# Patient Record
Sex: Male | Born: 1945
Health system: Southern US, Community
[De-identification: ages and names within clinical notes are randomized; demographics above are authoritative.]

## PROBLEM LIST (undated history)

## (undated) DIAGNOSIS — F419 Anxiety disorder, unspecified: Secondary | ICD-10-CM

## (undated) DIAGNOSIS — F32A Depression, unspecified: Secondary | ICD-10-CM

## (undated) DIAGNOSIS — E785 Hyperlipidemia, unspecified: Secondary | ICD-10-CM

## (undated) DIAGNOSIS — G4700 Insomnia, unspecified: Secondary | ICD-10-CM

## (undated) DIAGNOSIS — K449 Diaphragmatic hernia without obstruction or gangrene: Secondary | ICD-10-CM

## (undated) DIAGNOSIS — E119 Type 2 diabetes mellitus without complications: Secondary | ICD-10-CM

## (undated) DIAGNOSIS — H3530 Unspecified macular degeneration: Secondary | ICD-10-CM

## (undated) DIAGNOSIS — D369 Benign neoplasm, unspecified site: Secondary | ICD-10-CM

## (undated) DIAGNOSIS — E669 Obesity, unspecified: Secondary | ICD-10-CM

## (undated) DIAGNOSIS — K219 Gastro-esophageal reflux disease without esophagitis: Secondary | ICD-10-CM

## (undated) DIAGNOSIS — D689 Coagulation defect, unspecified: Secondary | ICD-10-CM

## (undated) DIAGNOSIS — D500 Iron deficiency anemia secondary to blood loss (chronic): Secondary | ICD-10-CM

## (undated) DIAGNOSIS — G4730 Sleep apnea, unspecified: Secondary | ICD-10-CM

## (undated) DIAGNOSIS — I81 Portal vein thrombosis: Secondary | ICD-10-CM

## (undated) DIAGNOSIS — Z8619 Personal history of other infectious and parasitic diseases: Secondary | ICD-10-CM

## (undated) DIAGNOSIS — R351 Nocturia: Principal | ICD-10-CM

## (undated) DIAGNOSIS — J310 Chronic rhinitis: Secondary | ICD-10-CM

## (undated) DIAGNOSIS — F329 Major depressive disorder, single episode, unspecified: Secondary | ICD-10-CM

## (undated) DIAGNOSIS — M1990 Unspecified osteoarthritis, unspecified site: Secondary | ICD-10-CM

## (undated) DIAGNOSIS — I10 Essential (primary) hypertension: Secondary | ICD-10-CM

## (undated) DIAGNOSIS — D45 Polycythemia vera: Secondary | ICD-10-CM

## (undated) DIAGNOSIS — F4321 Adjustment disorder with depressed mood: Secondary | ICD-10-CM

## (undated) HISTORY — DX: Unspecified osteoarthritis, unspecified site: M19.90

## (undated) HISTORY — DX: Sleep apnea, unspecified: G47.30

## (undated) HISTORY — DX: Gastro-esophageal reflux disease without esophagitis: K21.9

## (undated) HISTORY — DX: Chronic rhinitis: J31.0

## (undated) HISTORY — DX: Obesity, unspecified: E66.9

## (undated) HISTORY — PX: TONSILLECTOMY: SUR1361

## (undated) HISTORY — DX: Diaphragmatic hernia without obstruction or gangrene: K44.9

## (undated) HISTORY — DX: Type 2 diabetes mellitus without complications: E11.9

## (undated) HISTORY — PX: TENDON REPAIR: SHX5111

## (undated) HISTORY — DX: Portal vein thrombosis: I81

## (undated) HISTORY — DX: Major depressive disorder, single episode, unspecified: F32.9

## (undated) HISTORY — DX: Coagulation defect, unspecified: D68.9

## (undated) HISTORY — DX: Nocturia: R35.1

## (undated) HISTORY — DX: Polycythemia vera: D45

## (undated) HISTORY — PX: CARDIAC CATHETERIZATION: SHX172

## (undated) HISTORY — DX: Insomnia, unspecified: G47.00

## (undated) HISTORY — PX: WISDOM TOOTH EXTRACTION: SHX21

## (undated) HISTORY — DX: Anxiety disorder, unspecified: F41.9

## (undated) HISTORY — DX: Depression, unspecified: F32.A

## (undated) HISTORY — DX: Hyperlipidemia, unspecified: E78.5

## (undated) HISTORY — PX: ROTATOR CUFF REPAIR: SHX139

## (undated) HISTORY — DX: Iron deficiency anemia secondary to blood loss (chronic): D50.0

## (undated) HISTORY — PX: CARPAL TUNNEL RELEASE: SHX101

## (undated) HISTORY — DX: Adjustment disorder with depressed mood: F43.21

## (undated) HISTORY — DX: Benign neoplasm, unspecified site: D36.9

## (undated) HISTORY — DX: Essential (primary) hypertension: I10

## (undated) HISTORY — DX: Personal history of other infectious and parasitic diseases: Z86.19

---

## 1998-08-03 ENCOUNTER — Ambulatory Visit (HOSPITAL_COMMUNITY): Admission: RE | Admit: 1998-08-03 | Discharge: 1998-08-03 | Payer: Self-pay | Admitting: *Deleted

## 1999-04-02 ENCOUNTER — Emergency Department (HOSPITAL_COMMUNITY): Admission: EM | Admit: 1999-04-02 | Discharge: 1999-04-02 | Payer: Self-pay | Admitting: *Deleted

## 1999-04-03 ENCOUNTER — Encounter: Payer: Self-pay | Admitting: *Deleted

## 1999-04-21 ENCOUNTER — Ambulatory Visit (HOSPITAL_BASED_OUTPATIENT_CLINIC_OR_DEPARTMENT_OTHER): Admission: RE | Admit: 1999-04-21 | Discharge: 1999-04-21 | Payer: Self-pay | Admitting: *Deleted

## 2002-05-16 ENCOUNTER — Encounter (INDEPENDENT_AMBULATORY_CARE_PROVIDER_SITE_OTHER): Payer: Self-pay | Admitting: *Deleted

## 2002-05-16 ENCOUNTER — Ambulatory Visit (HOSPITAL_COMMUNITY): Admission: RE | Admit: 2002-05-16 | Discharge: 2002-05-16 | Payer: Self-pay | Admitting: *Deleted

## 2005-01-23 ENCOUNTER — Encounter: Admission: RE | Admit: 2005-01-23 | Discharge: 2005-01-23 | Payer: Self-pay | Admitting: Orthopaedic Surgery

## 2005-01-28 ENCOUNTER — Ambulatory Visit (HOSPITAL_COMMUNITY): Admission: RE | Admit: 2005-01-28 | Discharge: 2005-01-29 | Payer: Self-pay | Admitting: Orthopedic Surgery

## 2005-02-06 ENCOUNTER — Encounter: Admission: RE | Admit: 2005-02-06 | Discharge: 2005-03-20 | Payer: Self-pay | Admitting: Orthopedic Surgery

## 2005-06-17 HISTORY — PX: COLONOSCOPY: SHX174

## 2005-06-28 ENCOUNTER — Ambulatory Visit (HOSPITAL_COMMUNITY): Admission: RE | Admit: 2005-06-28 | Discharge: 2005-06-28 | Payer: Self-pay | Admitting: *Deleted

## 2006-03-07 ENCOUNTER — Ambulatory Visit: Payer: Self-pay | Admitting: Hematology & Oncology

## 2006-03-22 LAB — CBC WITH DIFFERENTIAL/PLATELET
BASO%: 0.3 % (ref 0.0–2.0)
MCHC: 34.1 g/dL (ref 32.0–35.9)
MONO#: 0.7 10*3/uL (ref 0.1–0.9)
RBC: 6.01 10*6/uL — ABNORMAL HIGH (ref 4.20–5.71)
RDW: 13.4 % (ref 11.2–14.6)
WBC: 7.3 10*3/uL (ref 4.0–10.0)
lymph#: 1.3 10*3/uL (ref 0.9–3.3)

## 2006-03-29 LAB — CBC WITH DIFFERENTIAL/PLATELET
Basophils Absolute: 0 10*3/uL (ref 0.0–0.1)
Eosinophils Absolute: 0.1 10*3/uL (ref 0.0–0.5)
HCT: 52.6 % — ABNORMAL HIGH (ref 38.7–49.9)
HGB: 17.9 g/dL — ABNORMAL HIGH (ref 13.0–17.1)
MCH: 30.3 pg (ref 28.0–33.4)
NEUT#: 5.5 10*3/uL (ref 1.5–6.5)
NEUT%: 71 % (ref 40.0–75.0)
RDW: 13 % (ref 11.2–14.6)
lymph#: 1.4 10*3/uL (ref 0.9–3.3)

## 2006-04-05 LAB — CBC WITH DIFFERENTIAL/PLATELET
Basophils Absolute: 0 10*3/uL (ref 0.0–0.1)
EOS%: 0.7 % (ref 0.0–7.0)
Eosinophils Absolute: 0.1 10*3/uL (ref 0.0–0.5)
HCT: 50.1 % — ABNORMAL HIGH (ref 38.7–49.9)
HGB: 17.1 g/dL (ref 13.0–17.1)
LYMPH%: 16.3 % (ref 14.0–48.0)
MCH: 30.2 pg (ref 28.0–33.4)
MCV: 88.6 fL (ref 81.6–98.0)
MONO%: 9.2 % (ref 0.0–13.0)
NEUT#: 5.9 10*3/uL (ref 1.5–6.5)
NEUT%: 73.4 % (ref 40.0–75.0)
Platelets: 297 10*3/uL (ref 145–400)

## 2006-04-12 LAB — CBC WITH DIFFERENTIAL/PLATELET
BASO%: 0.3 % (ref 0.0–2.0)
HCT: 46 % (ref 38.7–49.9)
HGB: 15.7 g/dL (ref 13.0–17.1)
MCHC: 34.1 g/dL (ref 32.0–35.9)
MONO#: 0.6 10*3/uL (ref 0.1–0.9)
NEUT#: 4.3 10*3/uL (ref 1.5–6.5)
NEUT%: 73.7 % (ref 40.0–75.0)
WBC: 5.9 10*3/uL (ref 4.0–10.0)
lymph#: 0.9 10*3/uL (ref 0.9–3.3)

## 2006-04-24 ENCOUNTER — Ambulatory Visit: Payer: Self-pay | Admitting: Hematology & Oncology

## 2006-04-26 LAB — CBC WITH DIFFERENTIAL/PLATELET
Basophils Absolute: 0 10*3/uL (ref 0.0–0.1)
EOS%: 1 % (ref 0.0–7.0)
Eosinophils Absolute: 0.1 10*3/uL (ref 0.0–0.5)
LYMPH%: 18.2 % (ref 14.0–48.0)
MCH: 30.3 pg (ref 28.0–33.4)
MCV: 89.3 fL (ref 81.6–98.0)
MONO%: 10.7 % (ref 0.0–13.0)
NEUT#: 4.7 10*3/uL (ref 1.5–6.5)
Platelets: 297 10*3/uL (ref 145–400)
RBC: 5.23 10*6/uL (ref 4.20–5.71)

## 2006-05-10 LAB — CBC WITH DIFFERENTIAL/PLATELET
BASO%: 1.1 % (ref 0.0–2.0)
LYMPH%: 17.5 % (ref 14.0–48.0)
MCHC: 34.7 g/dL (ref 32.0–35.9)
MCV: 85.6 fL (ref 81.6–98.0)
MONO%: 11.2 % (ref 0.0–13.0)
Platelets: 308 10*3/uL (ref 145–400)
RBC: 5.46 10*6/uL (ref 4.20–5.71)
RDW: 11.8 % (ref 11.2–14.6)
WBC: 6.5 10*3/uL (ref 4.0–10.0)

## 2006-05-24 LAB — CBC WITH DIFFERENTIAL/PLATELET
BASO%: 0.2 % (ref 0.0–2.0)
Eosinophils Absolute: 0.1 10*3/uL (ref 0.0–0.5)
MCHC: 33.6 g/dL (ref 32.0–35.9)
MONO#: 0.8 10*3/uL (ref 0.1–0.9)
NEUT#: 4.5 10*3/uL (ref 1.5–6.5)
Platelets: 321 10*3/uL (ref 145–400)
RBC: 5.18 10*6/uL (ref 4.20–5.71)
RDW: 13 % (ref 11.2–14.6)
WBC: 6.6 10*3/uL (ref 4.0–10.0)
lymph#: 1.2 10*3/uL (ref 0.9–3.3)

## 2006-06-07 ENCOUNTER — Ambulatory Visit: Payer: Self-pay | Admitting: Hematology & Oncology

## 2006-06-07 LAB — CBC WITH DIFFERENTIAL/PLATELET
BASO%: 0.4 % (ref 0.0–2.0)
Eosinophils Absolute: 0.1 10*3/uL (ref 0.0–0.5)
HCT: 43.4 % (ref 38.7–49.9)
LYMPH%: 20.1 % (ref 14.0–48.0)
MONO#: 0.7 10*3/uL (ref 0.1–0.9)
NEUT#: 3.6 10*3/uL (ref 1.5–6.5)
NEUT%: 65.4 % (ref 40.0–75.0)
Platelets: 341 10*3/uL (ref 145–400)
WBC: 5.5 10*3/uL (ref 4.0–10.0)
lymph#: 1.1 10*3/uL (ref 0.9–3.3)

## 2006-06-21 LAB — CBC WITH DIFFERENTIAL/PLATELET
BASO%: 0.5 % (ref 0.0–2.0)
HCT: 43.1 % (ref 38.7–49.9)
MCHC: 32.7 g/dL (ref 32.0–35.9)
MONO#: 0.5 10*3/uL (ref 0.1–0.9)
NEUT%: 72.8 % (ref 40.0–75.0)
RDW: 13.7 % (ref 11.2–14.6)
WBC: 5.4 10*3/uL (ref 4.0–10.0)
lymph#: 0.8 10*3/uL — ABNORMAL LOW (ref 0.9–3.3)

## 2006-06-21 LAB — FERRITIN: Ferritin: 7 ng/mL — ABNORMAL LOW (ref 22–322)

## 2006-06-21 LAB — CHCC SMEAR

## 2006-07-18 ENCOUNTER — Ambulatory Visit: Payer: Self-pay | Admitting: Hematology & Oncology

## 2006-07-19 LAB — CBC WITH DIFFERENTIAL/PLATELET
Basophils Absolute: 0 10*3/uL (ref 0.0–0.1)
EOS%: 1.4 % (ref 0.0–7.0)
HCT: 44.9 % (ref 38.7–49.9)
HGB: 14.8 g/dL (ref 13.0–17.1)
MCH: 26.7 pg — ABNORMAL LOW (ref 28.0–33.4)
MCV: 80.8 fL — ABNORMAL LOW (ref 81.6–98.0)
MONO%: 12.3 % (ref 0.0–13.0)
NEUT%: 67.1 % (ref 40.0–75.0)
lymph#: 1.1 10*3/uL (ref 0.9–3.3)

## 2006-08-16 LAB — CBC WITH DIFFERENTIAL/PLATELET
Basophils Absolute: 0 10*3/uL (ref 0.0–0.1)
EOS%: 1.8 % (ref 0.0–7.0)
HGB: 14.8 g/dL (ref 13.0–17.1)
LYMPH%: 13.8 % — ABNORMAL LOW (ref 14.0–48.0)
MCH: 25.9 pg — ABNORMAL LOW (ref 28.0–33.4)
MCV: 78.7 fL — ABNORMAL LOW (ref 81.6–98.0)
MONO%: 11 % (ref 0.0–13.0)
RDW: 16 % — ABNORMAL HIGH (ref 11.2–14.6)

## 2006-08-17 LAB — ABO AND RH: Rh Type: POSITIVE

## 2006-09-11 ENCOUNTER — Ambulatory Visit: Payer: Self-pay | Admitting: Hematology & Oncology

## 2006-09-13 LAB — CBC WITH DIFFERENTIAL/PLATELET
BASO%: 0.4 % (ref 0.0–2.0)
EOS%: 1.9 % (ref 0.0–7.0)
HCT: 45.7 % (ref 38.7–49.9)
LYMPH%: 15.8 % (ref 14.0–48.0)
MCH: 25.9 pg — ABNORMAL LOW (ref 28.0–33.4)
MCHC: 33 g/dL (ref 32.0–35.9)
MCV: 78.3 fL — ABNORMAL LOW (ref 81.6–98.0)
MONO%: 13.6 % — ABNORMAL HIGH (ref 0.0–13.0)
NEUT%: 68.3 % (ref 40.0–75.0)
Platelets: 301 10*3/uL (ref 145–400)
lymph#: 1 10*3/uL (ref 0.9–3.3)

## 2006-10-31 ENCOUNTER — Ambulatory Visit: Payer: Self-pay | Admitting: Hematology & Oncology

## 2006-11-09 LAB — CBC WITH DIFFERENTIAL/PLATELET
Basophils Absolute: 0 10*3/uL (ref 0.0–0.1)
Eosinophils Absolute: 0.1 10*3/uL (ref 0.0–0.5)
HCT: 47.2 % (ref 38.7–49.9)
HGB: 15.5 g/dL (ref 13.0–17.1)
MONO#: 0.6 10*3/uL (ref 0.1–0.9)
NEUT%: 67.8 % (ref 40.0–75.0)
WBC: 5.8 10*3/uL (ref 4.0–10.0)
lymph#: 1.1 10*3/uL (ref 0.9–3.3)

## 2006-12-07 LAB — CBC WITH DIFFERENTIAL/PLATELET
BASO%: 0.3 % (ref 0.0–2.0)
HCT: 45.5 % (ref 38.7–49.9)
MCHC: 33.2 g/dL (ref 32.0–35.9)
MONO#: 0.8 10*3/uL (ref 0.1–0.9)
NEUT#: 3.7 10*3/uL (ref 1.5–6.5)
NEUT%: 65.3 % (ref 40.0–75.0)
RBC: 5.62 10*6/uL (ref 4.20–5.71)
WBC: 5.7 10*3/uL (ref 4.0–10.0)
lymph#: 1.1 10*3/uL (ref 0.9–3.3)

## 2007-01-08 ENCOUNTER — Ambulatory Visit: Payer: Self-pay | Admitting: Hematology & Oncology

## 2007-01-11 LAB — CBC WITH DIFFERENTIAL/PLATELET
EOS%: 2.1 % (ref 0.0–7.0)
Eosinophils Absolute: 0.1 10*3/uL (ref 0.0–0.5)
LYMPH%: 22.1 % (ref 14.0–48.0)
MCH: 26.3 pg — ABNORMAL LOW (ref 28.0–33.4)
MCHC: 32.9 g/dL (ref 32.0–35.9)
MCV: 80 fL — ABNORMAL LOW (ref 81.6–98.0)
MONO%: 12.8 % (ref 0.0–13.0)
NEUT#: 3.2 10*3/uL (ref 1.5–6.5)
Platelets: 251 10*3/uL (ref 145–400)
RBC: 5.81 10*6/uL — ABNORMAL HIGH (ref 4.20–5.71)

## 2007-01-11 LAB — FERRITIN: Ferritin: 11 ng/mL — ABNORMAL LOW (ref 22–322)

## 2007-02-08 LAB — CBC WITH DIFFERENTIAL/PLATELET
BASO%: 0.5 % (ref 0.0–2.0)
EOS%: 1.3 % (ref 0.0–7.0)
HCT: 45.3 % (ref 38.7–49.9)
LYMPH%: 12.4 % — ABNORMAL LOW (ref 14.0–48.0)
MCH: 26.6 pg — ABNORMAL LOW (ref 28.0–33.4)
MCHC: 33.4 g/dL (ref 32.0–35.9)
NEUT%: 75.7 % — ABNORMAL HIGH (ref 40.0–75.0)
RBC: 5.69 10*6/uL (ref 4.20–5.71)
lymph#: 1 10*3/uL (ref 0.9–3.3)

## 2007-03-05 ENCOUNTER — Ambulatory Visit: Payer: Self-pay | Admitting: Hematology & Oncology

## 2007-03-08 LAB — CBC WITH DIFFERENTIAL/PLATELET
Basophils Absolute: 0 10*3/uL (ref 0.0–0.1)
Eosinophils Absolute: 0.1 10*3/uL (ref 0.0–0.5)
HGB: 15.9 g/dL (ref 13.0–17.1)
MCV: 78.6 fL — ABNORMAL LOW (ref 81.6–98.0)
MONO#: 0.8 10*3/uL (ref 0.1–0.9)
MONO%: 14 % — ABNORMAL HIGH (ref 0.0–13.0)
NEUT#: 3.5 10*3/uL (ref 1.5–6.5)
RBC: 5.84 10*6/uL — ABNORMAL HIGH (ref 4.20–5.71)
RDW: 16.8 % — ABNORMAL HIGH (ref 11.2–14.6)
WBC: 5.4 10*3/uL (ref 4.0–10.0)
lymph#: 1.1 10*3/uL (ref 0.9–3.3)

## 2007-04-17 LAB — CBC WITH DIFFERENTIAL/PLATELET
Basophils Absolute: 0 10*3/uL (ref 0.0–0.1)
Eosinophils Absolute: 0.2 10*3/uL (ref 0.0–0.5)
HCT: 44.6 % (ref 38.7–49.9)
HGB: 14.7 g/dL (ref 13.0–17.1)
LYMPH%: 19.7 % (ref 14.0–48.0)
MCV: 79.9 fL — ABNORMAL LOW (ref 81.6–98.0)
MONO#: 0.7 10*3/uL (ref 0.1–0.9)
MONO%: 11.7 % (ref 0.0–13.0)
NEUT#: 3.7 10*3/uL (ref 1.5–6.5)
NEUT%: 64.7 % (ref 40.0–75.0)
Platelets: 308 10*3/uL (ref 145–400)
RBC: 5.59 10*6/uL (ref 4.20–5.71)
WBC: 5.7 10*3/uL (ref 4.0–10.0)

## 2007-05-26 ENCOUNTER — Ambulatory Visit: Payer: Self-pay | Admitting: Hematology & Oncology

## 2007-05-29 LAB — CBC WITH DIFFERENTIAL/PLATELET
BASO%: 0.4 % (ref 0.0–2.0)
HCT: 43.3 % (ref 38.7–49.9)
LYMPH%: 17.2 % (ref 14.0–48.0)
MCHC: 33.3 g/dL (ref 32.0–35.9)
MCV: 80.4 fL — ABNORMAL LOW (ref 81.6–98.0)
MONO#: 0.7 10*3/uL (ref 0.1–0.9)
MONO%: 12.4 % (ref 0.0–13.0)
NEUT%: 67.4 % (ref 40.0–75.0)
Platelets: 247 10*3/uL (ref 145–400)
RBC: 5.38 10*6/uL (ref 4.20–5.71)
WBC: 6 10*3/uL (ref 4.0–10.0)

## 2007-07-22 ENCOUNTER — Ambulatory Visit: Payer: Self-pay | Admitting: Hematology & Oncology

## 2007-07-24 LAB — CBC WITH DIFFERENTIAL/PLATELET
BASO%: 0.3 % (ref 0.0–2.0)
EOS%: 2.4 % (ref 0.0–7.0)
HCT: 43.2 % (ref 38.7–49.9)
LYMPH%: 17.3 % (ref 14.0–48.0)
MCH: 27.3 pg — ABNORMAL LOW (ref 28.0–33.4)
MCHC: 34.3 g/dL (ref 32.0–35.9)
NEUT%: 65.8 % (ref 40.0–75.0)
Platelets: 254 10*3/uL (ref 145–400)
RBC: 5.44 10*6/uL (ref 4.20–5.71)
lymph#: 1.1 10*3/uL (ref 0.9–3.3)

## 2007-07-24 LAB — FERRITIN: Ferritin: 9 ng/mL — ABNORMAL LOW (ref 22–322)

## 2007-10-21 ENCOUNTER — Ambulatory Visit: Payer: Self-pay | Admitting: Hematology & Oncology

## 2007-10-23 LAB — CBC WITH DIFFERENTIAL/PLATELET
BASO%: 0.3 % (ref 0.0–2.0)
EOS%: 2.8 % (ref 0.0–7.0)
MCH: 27.9 pg — ABNORMAL LOW (ref 28.0–33.4)
MCV: 80.9 fL — ABNORMAL LOW (ref 81.6–98.0)
MONO%: 9.9 % (ref 0.0–13.0)
RBC: 5.74 10*6/uL — ABNORMAL HIGH (ref 4.20–5.71)
RDW: 15.6 % — ABNORMAL HIGH (ref 11.2–14.6)

## 2007-11-16 ENCOUNTER — Emergency Department (HOSPITAL_COMMUNITY): Admission: EM | Admit: 2007-11-16 | Discharge: 2007-11-16 | Payer: Self-pay | Admitting: *Deleted

## 2007-11-17 ENCOUNTER — Emergency Department (HOSPITAL_COMMUNITY): Admission: EM | Admit: 2007-11-17 | Discharge: 2007-11-17 | Payer: Self-pay | Admitting: Emergency Medicine

## 2008-01-20 ENCOUNTER — Ambulatory Visit: Payer: Self-pay | Admitting: Hematology & Oncology

## 2008-01-22 LAB — CBC WITH DIFFERENTIAL/PLATELET
EOS%: 2.8 % (ref 0.0–7.0)
Eosinophils Absolute: 0.3 10*3/uL (ref 0.0–0.5)
MCH: 26.7 pg — ABNORMAL LOW (ref 28.0–33.4)
MCV: 79.5 fL — ABNORMAL LOW (ref 81.6–98.0)
MONO%: 10.3 % (ref 0.0–13.0)
NEUT#: 6.9 10*3/uL — ABNORMAL HIGH (ref 1.5–6.5)
RBC: 5.8 10*6/uL — ABNORMAL HIGH (ref 4.20–5.71)
RDW: 15.4 % — ABNORMAL HIGH (ref 11.2–14.6)

## 2008-01-22 LAB — FERRITIN: Ferritin: 16 ng/mL — ABNORMAL LOW (ref 22–322)

## 2008-05-18 ENCOUNTER — Ambulatory Visit: Payer: Self-pay | Admitting: Hematology & Oncology

## 2008-05-18 LAB — CBC WITH DIFFERENTIAL/PLATELET
BASO%: 0.3 % (ref 0.0–2.0)
Basophils Absolute: 0 10*3/uL (ref 0.0–0.1)
EOS%: 1.6 % (ref 0.0–7.0)
HGB: 15.3 g/dL (ref 13.0–17.1)
MCH: 27.3 pg — ABNORMAL LOW (ref 28.0–33.4)
MCHC: 33.3 g/dL (ref 32.0–35.9)
MCV: 82 fL (ref 81.6–98.0)
MONO%: 10.6 % (ref 0.0–13.0)
RBC: 5.6 10*6/uL (ref 4.20–5.71)
RDW: 15.9 % — ABNORMAL HIGH (ref 11.2–14.6)

## 2008-07-15 ENCOUNTER — Encounter: Admission: RE | Admit: 2008-07-15 | Discharge: 2008-07-15 | Payer: Self-pay | Admitting: Specialist

## 2008-07-20 ENCOUNTER — Ambulatory Visit: Payer: Self-pay | Admitting: Hematology & Oncology

## 2008-07-22 LAB — CBC WITH DIFFERENTIAL (CANCER CENTER ONLY)
BASO#: 0 10*3/uL (ref 0.0–0.2)
Eosinophils Absolute: 0.2 10*3/uL (ref 0.0–0.5)
HCT: 46.7 % (ref 38.7–49.9)
HGB: 15.6 g/dL (ref 13.0–17.1)
LYMPH%: 17.6 % (ref 14.0–48.0)
MCH: 27.2 pg — ABNORMAL LOW (ref 28.0–33.4)
MCV: 81 fL — ABNORMAL LOW (ref 82–98)
MONO#: 0.5 10*3/uL (ref 0.1–0.9)
MONO%: 8.3 % (ref 0.0–13.0)
NEUT%: 70.8 % (ref 40.0–80.0)
Platelets: 271 10*3/uL (ref 145–400)
RBC: 5.73 10*6/uL — ABNORMAL HIGH (ref 4.20–5.70)
WBC: 5.7 10*3/uL (ref 4.0–10.0)

## 2008-07-28 LAB — JAK2 GENOTYPR

## 2008-07-28 LAB — FERRITIN: Ferritin: 11 ng/mL — ABNORMAL LOW (ref 22–322)

## 2008-10-20 ENCOUNTER — Ambulatory Visit: Payer: Self-pay | Admitting: Hematology & Oncology

## 2008-10-21 LAB — CBC WITH DIFFERENTIAL (CANCER CENTER ONLY)
BASO#: 0 10*3/uL (ref 0.0–0.2)
Eosinophils Absolute: 0.2 10*3/uL (ref 0.0–0.5)
HGB: 15 g/dL (ref 13.0–17.1)
MCH: 26.3 pg — ABNORMAL LOW (ref 28.0–33.4)
MONO#: 0.6 10*3/uL (ref 0.1–0.9)
MONO%: 8.9 % (ref 0.0–13.0)
NEUT#: 4.3 10*3/uL (ref 1.5–6.5)
RBC: 5.72 10*6/uL — ABNORMAL HIGH (ref 4.20–5.70)
WBC: 6.4 10*3/uL (ref 4.0–10.0)

## 2008-10-21 LAB — FERRITIN: Ferritin: 13 ng/mL — ABNORMAL LOW (ref 22–322)

## 2008-12-14 ENCOUNTER — Ambulatory Visit: Payer: Self-pay | Admitting: Hematology & Oncology

## 2008-12-15 LAB — CBC WITH DIFFERENTIAL (CANCER CENTER ONLY)
BASO#: 0.1 10*3/uL (ref 0.0–0.2)
EOS%: 2.8 % (ref 0.0–7.0)
Eosinophils Absolute: 0.2 10*3/uL (ref 0.0–0.5)
HGB: 15.1 g/dL (ref 13.0–17.1)
LYMPH#: 1.4 10*3/uL (ref 0.9–3.3)
NEUT#: 5.5 10*3/uL (ref 1.5–6.5)
Platelets: 277 10*3/uL (ref 145–400)
RBC: 5.8 10*6/uL — ABNORMAL HIGH (ref 4.20–5.70)

## 2008-12-18 HISTORY — PX: TOTAL KNEE ARTHROPLASTY: SHX125

## 2009-02-03 ENCOUNTER — Ambulatory Visit: Payer: Self-pay | Admitting: Hematology & Oncology

## 2009-02-04 LAB — CBC WITH DIFFERENTIAL (CANCER CENTER ONLY)
BASO%: 0.5 % (ref 0.0–2.0)
EOS%: 2.6 % (ref 0.0–7.0)
LYMPH#: 1.3 10*3/uL (ref 0.9–3.3)
MCHC: 32.2 g/dL (ref 32.0–35.9)
MONO%: 5.9 % (ref 0.0–13.0)
NEUT#: 4.1 10*3/uL (ref 1.5–6.5)
Platelets: 269 10*3/uL (ref 145–400)
RDW: 14.6 % (ref 10.5–14.6)

## 2009-02-10 ENCOUNTER — Inpatient Hospital Stay (HOSPITAL_COMMUNITY): Admission: RE | Admit: 2009-02-10 | Discharge: 2009-02-13 | Payer: Self-pay | Admitting: Specialist

## 2009-03-23 ENCOUNTER — Ambulatory Visit: Payer: Self-pay | Admitting: Hematology & Oncology

## 2009-03-25 LAB — CBC WITH DIFFERENTIAL (CANCER CENTER ONLY)
BASO%: 0.4 % (ref 0.0–2.0)
EOS%: 2.8 % (ref 0.0–7.0)
HCT: 41.3 % (ref 38.7–49.9)
LYMPH#: 1 10*3/uL (ref 0.9–3.3)
LYMPH%: 18.8 % (ref 14.0–48.0)
MCH: 26.3 pg — ABNORMAL LOW (ref 28.0–33.4)
MCHC: 32.1 g/dL (ref 32.0–35.9)
MONO%: 9.1 % (ref 0.0–13.0)
NEUT%: 68.9 % (ref 40.0–80.0)
RDW: 13.8 % (ref 10.5–14.6)

## 2009-03-25 LAB — FERRITIN: Ferritin: 20 ng/mL — ABNORMAL LOW (ref 22–322)

## 2009-05-18 ENCOUNTER — Ambulatory Visit: Payer: Self-pay | Admitting: Hematology & Oncology

## 2009-08-19 ENCOUNTER — Ambulatory Visit: Payer: Self-pay | Admitting: Hematology & Oncology

## 2009-08-19 LAB — CBC WITH DIFFERENTIAL (CANCER CENTER ONLY)
BASO%: 0.7 % (ref 0.0–2.0)
HCT: 44.9 % (ref 38.7–49.9)
LYMPH%: 20.5 % (ref 14.0–48.0)
MCHC: 32.5 g/dL (ref 32.0–35.9)
MCV: 78 fL — ABNORMAL LOW (ref 82–98)
MONO#: 0.5 10*3/uL (ref 0.1–0.9)
NEUT%: 69.1 % (ref 40.0–80.0)
RDW: 15.8 % — ABNORMAL HIGH (ref 10.5–14.6)
WBC: 6.8 10*3/uL (ref 4.0–10.0)

## 2009-08-19 LAB — FERRITIN: Ferritin: 10 ng/mL — ABNORMAL LOW (ref 22–322)

## 2009-11-17 ENCOUNTER — Ambulatory Visit: Payer: Self-pay | Admitting: Hematology & Oncology

## 2009-11-18 LAB — CBC WITH DIFFERENTIAL (CANCER CENTER ONLY)
Eosinophils Absolute: 0.2 10*3/uL (ref 0.0–0.5)
HCT: 41.2 % (ref 38.7–49.9)
HGB: 13.9 g/dL (ref 13.0–17.1)
LYMPH%: 22.9 % (ref 14.0–48.0)
MCV: 74 fL — ABNORMAL LOW (ref 82–98)
MONO#: 0.4 10*3/uL (ref 0.1–0.9)
NEUT%: 66.1 % (ref 40.0–80.0)
RBC: 5.54 10*6/uL (ref 4.20–5.70)
WBC: 5.6 10*3/uL (ref 4.0–10.0)

## 2010-03-01 ENCOUNTER — Ambulatory Visit: Payer: Self-pay | Admitting: Hematology & Oncology

## 2010-03-02 LAB — CBC WITH DIFFERENTIAL (CANCER CENTER ONLY)
BASO%: 0.5 % (ref 0.0–2.0)
Eosinophils Absolute: 0.2 10*3/uL (ref 0.0–0.5)
LYMPH#: 1.3 10*3/uL (ref 0.9–3.3)
MONO#: 0.5 10*3/uL (ref 0.1–0.9)
Platelets: 316 10*3/uL (ref 145–400)
RBC: 5.78 10*6/uL — ABNORMAL HIGH (ref 4.20–5.70)
RDW: 14.6 % (ref 10.5–14.6)
WBC: 6.9 10*3/uL (ref 4.0–10.0)

## 2010-03-02 LAB — CHCC SATELLITE - SMEAR

## 2010-03-02 LAB — FERRITIN: Ferritin: 11 ng/mL — ABNORMAL LOW (ref 22–322)

## 2010-05-03 ENCOUNTER — Ambulatory Visit: Payer: Self-pay | Admitting: Hematology & Oncology

## 2010-05-04 ENCOUNTER — Encounter: Payer: Self-pay | Admitting: Cardiology

## 2010-05-13 LAB — CBC WITH DIFFERENTIAL (CANCER CENTER ONLY)
BASO#: 0.1 10*3/uL (ref 0.0–0.2)
BASO%: 0.8 % (ref 0.0–2.0)
EOS%: 4 % (ref 0.0–7.0)
HCT: 44.1 % (ref 38.7–49.9)
HGB: 14.6 g/dL (ref 13.0–17.1)
LYMPH#: 1.1 10*3/uL (ref 0.9–3.3)
MCHC: 33.1 g/dL (ref 32.0–35.9)
MONO#: 0.4 10*3/uL (ref 0.1–0.9)
NEUT#: 4.2 10*3/uL (ref 1.5–6.5)
RDW: 14.1 % (ref 10.5–14.6)
WBC: 6 10*3/uL (ref 4.0–10.0)

## 2010-05-13 LAB — FERRITIN: Ferritin: 12 ng/mL — ABNORMAL LOW (ref 22–322)

## 2010-05-26 ENCOUNTER — Ambulatory Visit (HOSPITAL_COMMUNITY): Admission: RE | Admit: 2010-05-26 | Discharge: 2010-05-26 | Payer: Self-pay | Admitting: Cardiology

## 2010-08-10 ENCOUNTER — Ambulatory Visit: Payer: Self-pay | Admitting: Hematology & Oncology

## 2010-08-12 LAB — COMPREHENSIVE METABOLIC PANEL
ALT: 29 U/L (ref 0–53)
AST: 26 U/L (ref 0–37)
Albumin: 4 g/dL (ref 3.5–5.2)
Alkaline Phosphatase: 76 U/L (ref 39–117)
BUN: 18 mg/dL (ref 6–23)
CO2: 23 mEq/L (ref 19–32)
Calcium: 9.5 mg/dL (ref 8.4–10.5)
Chloride: 105 mEq/L (ref 96–112)
Creatinine, Ser: 1.17 mg/dL (ref 0.40–1.50)
Glucose, Bld: 184 mg/dL — ABNORMAL HIGH (ref 70–99)
Potassium: 4.2 mEq/L (ref 3.5–5.3)
Sodium: 137 mEq/L (ref 135–145)
Total Bilirubin: 0.4 mg/dL (ref 0.3–1.2)
Total Protein: 6.4 g/dL (ref 6.0–8.3)

## 2010-08-12 LAB — CBC WITH DIFFERENTIAL (CANCER CENTER ONLY)
BASO#: 0 10*3/uL (ref 0.0–0.2)
BASO%: 0.6 % (ref 0.0–2.0)
EOS%: 4 % (ref 0.0–7.0)
Eosinophils Absolute: 0.2 10*3/uL (ref 0.0–0.5)
HCT: 44.7 % (ref 38.7–49.9)
HGB: 14.6 g/dL (ref 13.0–17.1)
LYMPH#: 1.1 10*3/uL (ref 0.9–3.3)
LYMPH%: 23.2 % (ref 14.0–48.0)
MCH: 26.4 pg — ABNORMAL LOW (ref 28.0–33.4)
MCHC: 32.7 g/dL (ref 32.0–35.9)
MCV: 81 fL — ABNORMAL LOW (ref 82–98)
MONO#: 0.5 10*3/uL (ref 0.1–0.9)
MONO%: 10.1 % (ref 0.0–13.0)
NEUT#: 3 10*3/uL (ref 1.5–6.5)
NEUT%: 62.1 % (ref 40.0–80.0)
Platelets: 234 10*3/uL (ref 145–400)
RBC: 5.54 10*6/uL (ref 4.20–5.70)
RDW: 13.9 % (ref 10.5–14.6)
WBC: 4.9 10*3/uL (ref 4.0–10.0)

## 2010-08-12 LAB — FERRITIN: Ferritin: 11 ng/mL — ABNORMAL LOW (ref 22–322)

## 2010-08-12 LAB — CHCC SATELLITE - SMEAR

## 2010-11-22 ENCOUNTER — Ambulatory Visit (HOSPITAL_BASED_OUTPATIENT_CLINIC_OR_DEPARTMENT_OTHER): Payer: PRIVATE HEALTH INSURANCE | Admitting: Hematology & Oncology

## 2010-11-24 LAB — CBC WITH DIFFERENTIAL (CANCER CENTER ONLY)
BASO#: 0 10*3/uL (ref 0.0–0.2)
BASO%: 0.7 % (ref 0.0–2.0)
EOS%: 3.5 % (ref 0.0–7.0)
LYMPH%: 21.9 % (ref 14.0–48.0)
NEUT%: 64.7 % (ref 40.0–80.0)
Platelets: 273 10*3/uL (ref 145–400)
RBC: 5.86 10*6/uL — ABNORMAL HIGH (ref 4.20–5.70)
WBC: 5.3 10*3/uL (ref 4.0–10.0)

## 2010-11-24 LAB — FERRITIN: Ferritin: 11 ng/mL — ABNORMAL LOW (ref 22–322)

## 2010-11-24 LAB — RETICULOCYTES (CHCC): RBC.: 5.85 MIL/uL — ABNORMAL HIGH (ref 4.22–5.81)

## 2011-03-06 LAB — GLUCOSE, CAPILLARY
Glucose-Capillary: 117 mg/dL — ABNORMAL HIGH (ref 70–99)
Glucose-Capillary: 140 mg/dL — ABNORMAL HIGH (ref 70–99)

## 2011-03-08 ENCOUNTER — Encounter (HOSPITAL_BASED_OUTPATIENT_CLINIC_OR_DEPARTMENT_OTHER): Payer: PRIVATE HEALTH INSURANCE | Admitting: Hematology & Oncology

## 2011-03-08 DIAGNOSIS — D45 Polycythemia vera: Secondary | ICD-10-CM

## 2011-03-08 DIAGNOSIS — Z7982 Long term (current) use of aspirin: Secondary | ICD-10-CM

## 2011-03-08 LAB — CBC WITH DIFFERENTIAL (CANCER CENTER ONLY)
BASO%: 0.7 % (ref 0.0–2.0)
Eosinophils Absolute: 0.2 10*3/uL (ref 0.0–0.5)
LYMPH%: 16.8 % (ref 14.0–48.0)
MCH: 25.2 pg — ABNORMAL LOW (ref 28.0–33.4)
MCV: 75 fL — ABNORMAL LOW (ref 82–98)
MONO%: 13.4 % — ABNORMAL HIGH (ref 0.0–13.0)
Platelets: 235 10*3/uL (ref 145–400)
RDW: 16.3 % — ABNORMAL HIGH (ref 11.1–15.7)

## 2011-04-04 LAB — COMPREHENSIVE METABOLIC PANEL
AST: 26 U/L (ref 0–37)
Albumin: 3.8 g/dL (ref 3.5–5.2)
Chloride: 105 mEq/L (ref 96–112)
Creatinine, Ser: 1.03 mg/dL (ref 0.4–1.5)
GFR calc Af Amer: >60 mL/min (ref >60)
Total Bilirubin: 0.7 mg/dL (ref 0.3–1.2)
Total Protein: 6.6 g/dL (ref 6.0–8.3)

## 2011-04-04 LAB — URINALYSIS, ROUTINE W REFLEX MICROSCOPIC
Glucose, UA: NEGATIVE mg/dL
Nitrite: NEGATIVE
Specific Gravity, Urine: 1.017 (ref 1.005–1.030)
pH: 5 (ref 5.0–8.0)

## 2011-04-04 LAB — GLUCOSE, CAPILLARY
Glucose-Capillary: 106 mg/dL — ABNORMAL HIGH (ref 70–99)
Glucose-Capillary: 115 mg/dL — ABNORMAL HIGH (ref 70–99)
Glucose-Capillary: 118 mg/dL — ABNORMAL HIGH (ref 70–99)
Glucose-Capillary: 118 mg/dL — ABNORMAL HIGH (ref 70–99)
Glucose-Capillary: 121 mg/dL — ABNORMAL HIGH (ref 70–99)
Glucose-Capillary: 131 mg/dL — ABNORMAL HIGH (ref 70–99)
Glucose-Capillary: 141 mg/dL — ABNORMAL HIGH (ref 70–99)
Glucose-Capillary: 146 mg/dL — ABNORMAL HIGH (ref 70–99)
Glucose-Capillary: 155 mg/dL — ABNORMAL HIGH (ref 70–99)
Glucose-Capillary: 170 mg/dL — ABNORMAL HIGH (ref 70–99)

## 2011-04-04 LAB — CBC
HCT: 31.3 % — ABNORMAL LOW (ref 39.0–52.0)
Hemoglobin: 11.4 g/dL — ABNORMAL LOW (ref 13.0–17.0)
MCHC: 32.8 g/dL (ref 30.0–36.0)
MCV: 80.9 fL (ref 78.0–100.0)
Platelets: 193 10*3/uL (ref 150–400)
Platelets: 195 10*3/uL (ref 150–400)
Platelets: 245 10*3/uL (ref 150–400)
RBC: 3.81 MIL/uL — ABNORMAL LOW (ref 4.22–5.81)
RBC: 4.3 MIL/uL (ref 4.22–5.81)
RDW: 16.5 % — ABNORMAL HIGH (ref 11.5–15.5)
RDW: 16.6 % — ABNORMAL HIGH (ref 11.5–15.5)
WBC: 5.3 10*3/uL (ref 4.0–10.5)
WBC: 7.8 10*3/uL (ref 4.0–10.5)
WBC: 8.9 10*3/uL (ref 4.0–10.5)
WBC: 8.9 10*3/uL (ref 4.0–10.5)

## 2011-04-04 LAB — BASIC METABOLIC PANEL
BUN: 8 mg/dL (ref 6–23)
BUN: 9 mg/dL (ref 6–23)
CO2: 24 mEq/L (ref 19–32)
Calcium: 8 mg/dL — ABNORMAL LOW (ref 8.4–10.5)
Calcium: 8 mg/dL — ABNORMAL LOW (ref 8.4–10.5)
Chloride: 102 mEq/L (ref 96–112)
Chloride: 102 mEq/L (ref 96–112)
Creatinine, Ser: 0.97 mg/dL (ref 0.4–1.5)
Creatinine, Ser: 0.99 mg/dL (ref 0.4–1.5)
Creatinine, Ser: 1.15 mg/dL (ref 0.4–1.5)
GFR calc Af Amer: >60 mL/min (ref >60)
GFR calc Af Amer: >60 mL/min (ref >60)
GFR calc non Af Amer: >60 mL/min (ref >60)
GFR calc non Af Amer: >60 mL/min (ref >60)
Glucose, Bld: 160 mg/dL — ABNORMAL HIGH (ref 70–99)
Potassium: 3.5 mEq/L (ref 3.5–5.1)
Potassium: 3.9 mEq/L (ref 3.5–5.1)
Sodium: 132 mEq/L — ABNORMAL LOW (ref 135–145)

## 2011-04-04 LAB — DIFFERENTIAL
Basophils Absolute: 0 10*3/uL (ref 0.0–0.1)
Eosinophils Relative: 3 % (ref 0–5)
Lymphocytes Relative: 22 % (ref 12–46)
Lymphs Abs: 1.1 10*3/uL (ref 0.7–4.0)
Monocytes Absolute: 0.7 10*3/uL (ref 0.1–1.0)
Monocytes Relative: 13 % — ABNORMAL HIGH (ref 3–12)

## 2011-04-04 LAB — CROSSMATCH
ABO/RH(D): O POS
Antibody Screen: NEGATIVE

## 2011-04-04 LAB — HEMOGLOBIN A1C
Hgb A1c MFr Bld: 7.1 % — ABNORMAL HIGH (ref 4.6–6.1)
Hgb A1c MFr Bld: 7.2 % — ABNORMAL HIGH (ref 4.6–6.1)

## 2011-04-04 LAB — APTT: aPTT: 30 seconds (ref 24–37)

## 2011-04-04 LAB — ABO/RH: ABO/RH(D): O POS

## 2011-04-07 ENCOUNTER — Encounter: Payer: Self-pay | Admitting: Cardiology

## 2011-05-02 NOTE — Consult Note (Signed)
James [name], James [name]                ACCOUNT NO.:  [id number]   MEDICAL RECORD NO.:  [id number]          PATIENT TYPE:  INP   LOCATION:  0008                         FACILITY:  [facility]   PHYSICIAN:  Reyne Dumas, MD       DATE OF BIRTH:  [birth year]   DATE OF CONSULTATION:  DATE OF DISCHARGE:                                 CONSULTATION   CONSULTATION:  Medical consultation requested for management of  hypertension and diabetes.   SUBJECTIVE:  This is a 65 year old male with a history of hypertension,  diabetes, dyslipidemia who was being evaluated by the [facility] service  for medical management of anti-hypertensive and diabetic medications for  surgery.  The patient is status post surgery for end-stage  osteoarthritis of the right knee and is status post total knee  arthroplasty.  The patient had surgery under spinal anesthesia and is  mildly hypertensive postop but awake, alert and oriented and is able to  provide history.  The patient had a Cardiolite stress test by Dr. Wilson Singer  prior to surgery, prior to medical care and for the surgery.  He denies  any preceding cardiopulmonary symptoms and has been attempting to lose  weight and has lost about 10 pounds prior to the surgery.  He denies any  symptoms of chest pain, shortness of breath proceeding surgery or after  the procedure.   PAST MEDICAL HISTORY:  1. Polycythemia vera.  2. Diabetes.  3. Hyperlipidemia.  4. Hypertension.  5. Gastroesophageal reflux disease.   DRUG ALLERGIES:  No known drug allergies.   MEDICATIONS:  1. Glumetza 500 b.i.d.  2. Crestor 20 mg in the morning.  3. Diovan/hydrochlorothiazide 325/25 q.a.m.  4. Aspirin 81 mg p.o. daily.  5. Prilosec 20 mg p.o. daily.   PAST SURGICAL HISTORY:  1. Status post right knee arthroplasty.  2. Status post right rotator cuff repair.  3. Carpal tunnel release bilaterally.  4. Tonsillectomy.   FAMILY HISTORY:  Positive for COPD and hypertension.   SOCIAL HISTORY:   The patient does not smoke or drink.   REVIEW OF SYSTEMS:  As in HPI.   PHYSICAL EXAMINATION:  VITAL SIGNS:  Blood pressure 98/56, pulse 70,  respirations 16, afebrile.  GENERAL:  The patient appears to be well-nourished, well-developed,  comfortable, no acute distress.  HEENT:  Pupils equal and reactive to light.  Extraocular movements  intact.  LUNGS:  Clear to auscultation bilaterally.  No wheezes, rhonchi or  crackles.  NECK:  Supple without any JVD.  No carotid bruits.  ABDOMEN:  Soft, nontender, nondistended, no muscle organomegaly.  NEUROLOGIC:  Alert and oriented x3.  Cranial nerves II-XII grossly  intact.  EXTREMITIES:  Range of motion not attempted.  Distal pulses 2+  bilaterally.   LABORATORY DATA:  Most recent labs from February 02, 2009, show a normal  WBC, hemoglobin and platelet count.  Normal INR, normal electrolyte  panel except for mildly elevated blood sugar of 176 and a negative  urinalysis.   ASSESSMENT/PLAN:  1. Diabetes.  The patient's metformin has been on hold and will be on  hold until discharge.  The patient will be started on NovoLog      sliding scale insulin q.6 h.  cBGs will be started on a sensitive      scale.  2. Hypertension.  The patient's Diovan and hydrochlorothiazide will be      reinitiated once the patient's blood pressure is stabilized.Since      he is mildly hypotensive will hold these and bolus him  3. GERD.  Continue with Prilosec.  4. CBC, B-met in the morning.   Thank you for this consult, will continued to follow the patient.      Reyne Dumas, MD  Electronically Signed     NA/MEDQ  D:  02/10/2009  T:  02/10/2009  Job:  [id number]

## 2011-05-02 NOTE — Discharge Summary (Signed)
James [name], James [name]                ACCOUNT NO.:  [id number]   MEDICAL RECORD NO.:  [id number]          PATIENT TYPE:  INP   LOCATION:  3151                         FACILITY:  [facility]   PHYSICIAN:  Cynda Familia, M.D.DATE OF BIRTH:  1946-06-07   DATE OF ADMISSION:  02/10/2009  DATE OF DISCHARGE:  02/13/2009                               DISCHARGE SUMMARY   ADMITTING DIAGNOSIS:  End-stage osteoarthritis, right knee.   DISCHARGE DIAGNOSIS:  End-stage osteoarthritis, right knee.   OPERATION:  Total knee arthroplasty, right knee.   BRIEF HISTORY:  This is a 65 year old gentleman with history end-stage  osteoarthritis of the right knee with failure of conservative treatment  to alleviate his pain and discomfort.  After discussion of treatment,  benefits, risks and options, the patient is now scheduled for total knee  arthroplasty.   LABORATORY VALUES:  Admission CBC within normal limits except RDW high  at 16.5.  Hemoglobin/hematocrit reached a low of 10.3 and 31.3 on the  27th.  PT/PTT within normal limits.  Admission BMET showed the glucose  high at 176 and it ran in the 139-176 range throughout admission.  He  also had slight hyponatremia on the 25th at 132 which was corrected on  the 26th to 136.  Glycosylated hemoglobin 7.2.   COURSE IN THE HOSPITAL:  The patient tolerated the procedure well.  First postoperative day, his vital signs were stable.  He was afebrile.  There was good hemoglobin at 11.4, hematocrit 34.9.  BMET with sodium  132, glucose 161.  Lungs clear.  Heart sounds normal.  Bowel sounds  active.  Drain was removed without difficulty.  He was put on 1000 cc  free water restriction.  [facility] hospitalist was consulted for  management of diabetes and hypertension.  Second postoperative day, he  was feeling better.  Vital signs stable, afebrile.  INR was good.  Hemoglobin 11.2, hematocrit 34.1, glucose 160.  Lungs clear.  Heart  sounds normal.  Bowel sounds  active.  Calves negative.  Dressing was  changed.  Wound was benign.  Neurovascular status intact and his  physical therapy was continued.  DC plan was made for Saturday, Sunday.  Third postoperative day, vital signs were stable.  He was feeling good.  His wound was benign.  Hemoglobin 10.4, sodium back up to 139.  Neurovascular status was okay.  Calves were negative and plans were made  for the patient to be discharged home after therapy today.   CONDITION ON DISCHARGE:  Improved.   DISCHARGE MEDICATIONS:  1. Oxycodone 5 mg 1-2 q.4-6 h. p.r.n. pain.  2. Robaxin 500 one p.o. q.8 h. p.r.n. spasm.  3. Lovenox 30 mg one shot each day at 7 a.m. and 7 p.m.  When Lovenox      finished, resume aspirin therapy.  4. Colace 100 mg twice a day.  5. MiraLax p.r.n. constipation.   He is to keep his wound clean and dry for a total of 3 weeks.  Eat a low  sodium heart healthy diabetic diet.  Use his crutches or walker and walk  with assistance.  Call for followup in the office in 2 weeks or call  sooner p.r.n. problems.      Judith Part. Chabon, P.A.    ______________________________  Cynda Familia, M.D.    SJC/MEDQ  D:  03/15/2009  T:  03/15/2009  Job:  [id number]

## 2011-05-02 NOTE — Op Note (Signed)
James [name], James [name]                ACCOUNT NO.:  [id number]   MEDICAL RECORD NO.:  [id number]          PATIENT TYPE:  INP   LOCATION:  0008                         [facility]ACILITY:  [facility]   PHYSICIAN:  Cynda Familia, M.D.DATE OF BIRTH:  June 28, 1946   DATE OF PROCEDURE:  02/10/2009  DATE OF DISCHARGE:                               OPERATIVE REPORT   PREOPERATIVE DIAGNOSIS:  Right knee end-stage osteoarthritis.   POSTOPERATIVE DIAGNOSIS:  Right knee end-stage osteoarthritis.   PROCEDURE:  Right total knee arthroplasty.   SURGEON:  Dr. Hart Robinsons.   ASSISTANT:  Molli Barrows, PA-C.   ANESTHESIA:  Spinal with monitored anesthesia care.   ESTIMATED BLOOD LOSS:  Less than 100 mL.   DRAINS:  One medium Hemovac.   COMPLICATIONS:  None.   TOURNIQUET TIME:  Eighty minutes at 300 mmHg.   DISPOSITION:  To PACU stable.   OPERATIVE IMPLANTS:  De[employer]uy, [employer], Sigma size 5 femur,  size 5 tibia, 12.5 mm posterior stabilized rotating platform tibial  insert and a 38 mm all-polyethylene patella, all cemented.   DESCRIPTION OF PROCEDURE:  The patient was counseled in the holding  area, the correct side was identified.  An IV was started.  He was taken  to the operating room where a spinal anesthetic was administered.  IV  antibiotics were given.  A Foley catheter was placed under sterile  technique by the OR circulating nurse.  The right leg elevated, prepped  with DuraPrep and draped in a sterile fashion.  A straight midline  incision made through the skin and subcutaneous tissue.  Medial and  lateral flaps were developed.  Medial parapatellar arthrotomy was  performed.  The patella was retracted of the way, but not everted.  The  knee was flexed.  End-stage arthritis changes.  Cruciate limbs were  resected.  Starter hole made in the distal femur.  The canal was  irrigated for effluent was clear.  Intramedullary rod was gently placed.  We chose a 5 degree valgus cut and  took a 10-mm cut off distal femur due  to this.  Medial and lateral menisci were removed under direct  visualization.  Geniculate vessels were evaluated and coagulated.  Post  neurovascular structures were followed and protected throughout the  entire case.  The femur was found be a size #5.  Rotation marks were  made and cut to fit a size #5.  Tibial extramedullary alignment jig was  placed.  We chose a 10 mm cut off the least deficient side which was  lateral and a 0 degree slope.  Posteromedial and posterolateral femoral  osteophytes were removed under direct visualization.  With flexion and  extension blocks for 10 we were well-balanced.  The tibia was exposed  and found to be a size 5.  Rotation cover were set and reamer and punch  was performed.  Femoral box cut with a femoral trial of 5, a size 5  femur, size 5 tibia, 12.5 insert were well-balanced in  flexion/extension.  The patella was found to be a size 38 and  appropriate bone was  resected.  Locking holes were made and the patellar  button tracked anatomically.  All trials were removed.  The knee was  irrigated with pulsatile lavage.  Utilizing moderate cement technique,  all components were cemented in place.  Size 5 femur, size 5 tibia, 12.5  mm posterior stabilized rotating platform tibial insert and 38 mm  patella.  Excess cement was removed.  The knee was irrigated.  Excellent  alignment, rotation, coverage and patellar tracking..  The tibial trial  was removed and with a 12.5 insert was well-balanced.  A median Hemovac  drain was placed.  A sequential closure in layers was done.  The  arthrotomy was closed at 90 degrees of flexion with Vicryl in an  interrupted figure-of-eight fashion, the subcu with Vicryl, skin with  subcu Monocryl suture.  Steri-Strips were applied.  The drain was hooked  to suction.  Sterile dressings applied.  The tourniquet was deflated.  He had normal circulation in the foot and ankle at the end of  the case.  The patient was taken from the operating room to the PACU in stable  condition.  Sponge and needle count were correct.   To help with surgical technique and decision making, Mr. Molli Barrows,  PA-C assistance was needed throughout the entire case.           ______________________________  Cynda Familia, M.D.     RAC/MEDQ  D:  02/10/2009  T:  02/11/2009  Job:  [id number]

## 2011-05-02 NOTE — H&P (Signed)
James [name], James [name]                ACCOUNT NO.:  [id number]   MEDICAL RECORD NO.:  [id number]          PATIENT TYPE:  INP   LOCATION:                               FACILITY:  [facility]   PHYSICIAN:  Cynda Familia, M.D. DATE OF BIRTH:   DATE OF ADMISSION:  02/10/2009  DATE OF DISCHARGE:                              HISTORY & PHYSICAL   CHIEF COMPLAINT:  End-stage osteoarthritis right knee.   BRIEF HISTORY:  This is a 65 year old gentleman with a history of end-  stage osteoarthritis of his right knee with failure of conservative  treatment to alleviate his pain and discomfort.  After discussion of  treatment benefits, risks and options the patient now scheduled for  total knee arthroplasty.  Note that over the last couple weeks he has  had some increased shortness of breath.  He has seen Dr. Wilson Singer and was  sent for a Cardiolite stress test, the results are not back.  He just  had it this morning, but we called Dr. Eugenio Hoes office and will need to  get repeat medical clearance for the surgery pending the results of this  new test.  If this test is normal and clearance by Dr. Wilson Singer is  obtained, then the patient will proceed with surgery.  Again the surgery  risks, benefits and aftercare were discussed in detail with the patient,  questions invited and answered.   PAST MEDICAL HISTORY:   DRUG ALLERGIES:  None.   MEDICATIONS:  1. Glumetza 500 mg b.i.d.  2. Crestor 20 mg 1 q. a.m.  3. Diovan hydrochlorothiazide 320/25 one q. a.m.  4. Aspirin 81 mg 1 q. a.m.  5. Prilosec daily.   PREVIOUS SURGERY:  Includes right rotator cuff repair, right knee  arthroscopy, carpal tunnel release bilaterally, tonsillectomy.   SERIOUS MEDICAL ILLNESSES:  Include polycythemia vera, diabetes,  hyperlipidemia, hypertension and reflux.   FAMILY HISTORY:  Positive for COPD and hypertension.   SOCIAL HISTORY:  The patient is married, is a Engineer, maintenance (IT).  Does not smoke or  drink.   REVIEW OF SYSTEMS:   CENTRAL NERVOUS SYSTEM:  Negative for headache,  blurred vision or dizziness.  PULMONARY:  Positive for recent shortness  of breath with exertion, negative for PND and orthopnea.  CARDIOVASCULAR:  Positive for hypertension, hyperlipidemia and  polycythemia.  Negative for chest pain or palpitation.  GI:  Positive  for history of hiatal hernia and reflux.  GU:  Negative for urinary  tract difficulty.  MUSCULOSKELETAL:  Positive as in HPI.   PHYSICAL EXAM:  VITAL SIGNS:  BP 120/78, respirations 16, pulse 72 and  regular.  GENERAL APPEARANCE:  This is a well-developed, well-nourished obese  gentleman in no acute distress.  HEENT:  Head normocephalic.  Nose patent.  Ears patent.  Pupils equal,  round reactive to light.  Throat without injection.  NECK:  Supple without adenopathy.  Carotids 2+ without bruit.  CHEST:  Clear to auscultation.  No rales or rhonchi.  Respirations 16.  HEART:  Regular rate and rhythm at 72 beats per minute without murmur.  ABDOMEN:  Soft with  active bowel sounds.  No masses or organomegaly.  NEUROLOGIC:  Patient alert and oriented to time, place and person.  Cranial nerves II-XII grossly intact.  EXTREMITIES:  Shows the right knee with 0-135 degrees range of motion,  varus deformity.  Dorsalis pedis and posterior tibialis pulses are 2+.   X-RAYS:  Show end-stage osteoarthritis right knee.   IMPRESSION:  End-stage osteoarthritis right knee.   PLAN:  Total knee arthroplasty right knee pending repeat medical  clearance pending results of Cardiolite stress test.      Judith Part. Chabon, P.A.    ______________________________  Cynda Familia, M.D.    SJC/MEDQ  D:  01/25/2009  T:  01/25/2009  Job:  [id number]

## 2011-05-05 NOTE — Procedures (Signed)
[facility]  Patient:    James [name], James [name] Visit Number: [id number] MRN: [id number]          Service Type: END Location: ENDO Attending Physician:  Jim Desanctis Dictated by:   Jim Desanctis, M.D. Proc. Date: 05/16/02 Admit Date:  05/16/2002                             Procedure Report  PROCEDURE:  Upper endoscopy.  INDICATION FOR PROCEDURE:  GERD.  ANESTHESIA:  Demerol 50, Versed 5 mg.  DESCRIPTION OF PROCEDURE:  With the patient mildly sedated in the left lateral decubitus position, the Olympus video endoscope was inserted in the mouth and passed under direct vision through the esophagus which appeared normal until we reached the distal esophagus and there was a question of esophagitis seen. It was photographed and biopsied. We entered into the stomach. The fundus, body, antrum, duodenal bulb and second portion of the duodenum were visualized, all appeared normal. From this point, the endoscope was slowly withdrawn taking circumferential views of the entire duodenal mucosa until the endoscope was then pulled back into the stomach, placed in retroflexion to view the stomach from below. and a large hiatal hernia was seen and photographed. The endoscope was then straightened and withdrawn taking circumferential views of the remaining gastric and esophageal mucosa. The patients vital signs and pulse oximeter remained stable. The patient tolerated the procedure well without apparent complications.  FINDINGS:  Esophagitis above a hiatal hernia. Await biopsy report. The patient will call me for results and followup with me as an outpatient. Dictated by:   Jim Desanctis, M.D. Attending Physician:  Jim Desanctis DD:  05/16/02 TD:  05/16/02 Job: [id number]

## 2011-05-09 NOTE — Op Note (Signed)
NAMEDHRUVAN, GULLION                ACCOUNT NO.:  [id number]   MEDICAL RECORD NO.:  [id number]          PATIENT TYPE:  OIB   LOCATION:  5035                         FACILITY:  [facility]   PHYSICIAN:  Anderson Malta, M.D.    DATE OF BIRTH:  05-16-1946   DATE OF PROCEDURE:  01/28/2005  DATE OF DISCHARGE:  01/29/2005                                 OPERATIVE REPORT   PREOPERATIVE DIAGNOSIS:  Left elbow three week old distal biceps tendon  rupture.   POSTOPERATIVE DIAGNOSIS:  Left elbow three week old distal biceps tendon  rupture.   PROCEDURE:  Left elbow biceps tendon repair using two-incision technique.   SURGEON ATTENDING:  Anderson Malta, M.D.   ASSISTANT:  Zollie Beckers, M.D.   ANESTHESIA:  Regional plus IV sedation.   ESTIMATED BLOOD LOSS:  75 mL.   DRAINS:  TLS x2.   TOURNIQUET TIME:  58 minutes at 250 mmHg.   PROCEDURE IN DETAIL:  The patient brought to the operating room where IV  regional and IV sedation anesthesia was induced.  Left arm was prepped with  DuraPrep solution, draped in a sterile manner.  An incision was made in the  elbow flexion crease.  Skin and subcutaneous tissue were sharply divided.  Crossing veins were mobilized medially, laterally and the brachiocutaneous  nerve was identified.  Under headlight guidance, the stump of the biceps  tendon was identified.  Through the tendon two #2 FiberWire sutures on taper  needle were placed in modified Krackow fashion.  The end of the tendon was  freshened and tapered.  Following placement of the sutures into the distal  end of the biceps tendon, blunt dissection was made between the pronator and  brachioradialis.  Fluoroscopic guidance was used to localize the proximal  location of the radial tuberosity.  The tuberosity was felt with digital  palpation.  A Kelly clamp was then passed along the course of the biceps  tendon on the undersurface of the radius, with great care taken to avoid any  contact with the ulna.   Once the Claiborne Billings was passed on the medial side of the  radius at the level of the tuberosity, it was palpated on the dorsal lateral  aspect.  The Kelly clamp was passed with the arm in supination and then the  arm was pronated and the tip was palpated.  An incision was then made,  taking great care to avoid any exposure of the ulna.  A muscle splitting  incision was made at the level of the tuberosity, with the clamp visualized  a loop of Vicryl suture was drawn back up through the superior incision.  At  this time, with the loop passed the arm was elevated and exsanguinated with  the Esmarch __________ and the tourniquet was inflated.  Muscle splitting  approach was then revisited on the dorsal lateral aspect of the proximal  forearm.  The radial tuberosity was palpated and visualized with the arm in  maximum pronation.  Subperiosteal dissection was performed on both the  anterior and posterior aspect of the radius.  Retractor  placement was done  in a manner to keep the retractors on bone at all times.  Using osteotome  and curets, a trough was created in the native biceps tuberosity.  The  incision was thoroughly irrigated.  At this time the biceps tendon was  passed using the looped Vicryl suture.  A [employer] medium cartridge was then  used to drill 3 holes approximately 7-8 mm away from the trough on the  dorsal aspect of the radius.  Two central #2 FiberWires were placed in the  central hole and the other FiberWires were placed through the proximal and  distal drill holes.  Secure reduction was achieved of the biceps tendon  stump into the trough.  The sutures were then tied over a bony bridge.  The  arm was taken through pronation, supination, range of motion and there is no  gapping at the fracture site.  No gapping at the tendon insertion site.  Sutures remained tight.  At this time tourniquet was released, bleeding  points encountered were controlled using bipolar electrocautery.   Approximately 2 liters of irrigating solution were passed through each  incision.  TLSOs were placed into each incision.  The muscle splitting  approach dorsolaterally was closed using a 0 Vicryl suture followed by  interrupted inverted 2-0 Vicryl suture and 3-0 pull-out Prolene.  The elbow  flexion crease incision was closed using 3-0 Vicryl suture and interrupted 3-  0 nylon suture.  Patient had palpable radial pulse at the conclusion of the  case, block prevented evaluation of the motor function of the hand.  Overall  the patient tolerated the procedure well without immediate complications.  The patient was placed in a bulky, well-padded splint.      GSD/MEDQ  D:  01/28/2005  T:  01/29/2005  Job:  [id number]

## 2011-05-09 NOTE — Op Note (Signed)
James [name], James [name]                ACCOUNT NO.:  [id number]   MEDICAL RECORD NO.:  [id number]          PATIENT TYPE:  AMB   LOCATION:  ENDO                         FACILITY:  [facility]   PHYSICIAN:  Waverly Ferrari, M.D.    DATE OF BIRTH:  09-Jul-1946   DATE OF PROCEDURE:  06/28/2005  DATE OF DISCHARGE:                                 OPERATIVE REPORT   PROCEDURE:  Colonoscopy.   INDICATIONS:  Colon cancer screening, hemoccult positivity by history.   ANESTHESIA:  Demerol 60, Versed 7 mg.   DESCRIPTION OF PROCEDURE:  With the patient mildly sedated in the left  lateral decubitus position, a rectal examination was performed which was  unremarkable to my exam. Subsequently the Olympus videoscopic colonoscope  was inserted into the rectum and passed under direct vision to cecum  identified by ileocecal valve and appendiceal orifice both of which were  photographed. From this point, the colonoscope was slowly withdrawn taking  circumferential views of colonic mucosa stopping only in the rectum which  appeared normal in direct and showed hemorrhoids on retroflexed view. The  endoscope was straightened and withdrawn. The patient's vital signs and  pulse oximeter remained stable. The patient tolerated the procedure well  without apparent complications.   FINDINGS:  Diverticulosis of sigmoid colon, moderately severe internal  hemorrhoids,  otherwise unremarkable examination.   PLAN:  Consider repeat examination in 5-10 years       GMO/MEDQ  D:  06/28/2005  T:  06/28/2005  Job:  [id number]

## 2011-06-14 ENCOUNTER — Other Ambulatory Visit: Payer: Self-pay | Admitting: Hematology & Oncology

## 2011-06-14 ENCOUNTER — Encounter (HOSPITAL_BASED_OUTPATIENT_CLINIC_OR_DEPARTMENT_OTHER): Payer: PRIVATE HEALTH INSURANCE | Admitting: Hematology & Oncology

## 2011-06-14 DIAGNOSIS — Z7982 Long term (current) use of aspirin: Secondary | ICD-10-CM

## 2011-06-14 DIAGNOSIS — D45 Polycythemia vera: Secondary | ICD-10-CM

## 2011-06-14 LAB — CBC WITH DIFFERENTIAL (CANCER CENTER ONLY)
BASO%: 0.6 % (ref 0.0–2.0)
EOS%: 2.2 % (ref 0.0–7.0)
LYMPH#: 1.1 10*3/uL (ref 0.9–3.3)
MCH: 26.6 pg — ABNORMAL LOW (ref 28.0–33.4)
MCHC: 34.8 g/dL (ref 32.0–35.9)
MONO%: 12.1 % (ref 0.0–13.0)
NEUT#: 3.6 10*3/uL (ref 1.5–6.5)
Platelets: 203 10*3/uL (ref 145–400)

## 2011-06-14 LAB — RETICULOCYTES (CHCC)
RBC.: 5.75 MIL/uL (ref 4.22–5.81)
Retic Ct Pct: 1.5 % (ref 0.4–2.3)

## 2011-06-14 LAB — CHCC SATELLITE - SMEAR

## 2011-08-30 ENCOUNTER — Other Ambulatory Visit: Payer: Self-pay | Admitting: Hematology & Oncology

## 2011-08-30 ENCOUNTER — Encounter (HOSPITAL_BASED_OUTPATIENT_CLINIC_OR_DEPARTMENT_OTHER): Payer: PRIVATE HEALTH INSURANCE | Admitting: Hematology & Oncology

## 2011-08-30 DIAGNOSIS — Z7982 Long term (current) use of aspirin: Secondary | ICD-10-CM

## 2011-08-30 DIAGNOSIS — D45 Polycythemia vera: Secondary | ICD-10-CM

## 2011-08-30 LAB — CBC WITH DIFFERENTIAL (CANCER CENTER ONLY)
BASO%: 0.5 % (ref 0.0–2.0)
HCT: 42.9 % (ref 38.7–49.9)
LYMPH%: 19.6 % (ref 14.0–48.0)
MCH: 27 pg — ABNORMAL LOW (ref 28.0–33.4)
MCV: 79 fL — ABNORMAL LOW (ref 82–98)
MONO#: 0.6 10*3/uL (ref 0.1–0.9)
MONO%: 10.1 % (ref 0.0–13.0)
NEUT%: 67.4 % (ref 40.0–80.0)
Platelets: 211 10*3/uL (ref 145–400)
RDW: 15.7 % (ref 11.1–15.7)

## 2011-08-30 LAB — CHCC SATELLITE - SMEAR

## 2011-08-30 LAB — RETICULOCYTES (CHCC): ABS Retic: 94 10*3/uL (ref 19.0–186.0)

## 2011-11-29 ENCOUNTER — Encounter: Payer: Self-pay | Admitting: Hematology & Oncology

## 2011-11-29 ENCOUNTER — Other Ambulatory Visit: Payer: Self-pay | Admitting: Hematology & Oncology

## 2011-11-29 ENCOUNTER — Ambulatory Visit (HOSPITAL_BASED_OUTPATIENT_CLINIC_OR_DEPARTMENT_OTHER): Payer: PRIVATE HEALTH INSURANCE

## 2011-11-29 ENCOUNTER — Ambulatory Visit (HOSPITAL_BASED_OUTPATIENT_CLINIC_OR_DEPARTMENT_OTHER): Payer: PRIVATE HEALTH INSURANCE | Admitting: Hematology & Oncology

## 2011-11-29 ENCOUNTER — Other Ambulatory Visit (HOSPITAL_BASED_OUTPATIENT_CLINIC_OR_DEPARTMENT_OTHER): Payer: PRIVATE HEALTH INSURANCE | Admitting: Lab

## 2011-11-29 VITALS — BP 141/89 | HR 96 | Temp 97.0°F | Ht 66.0 in | Wt 258.5 lb

## 2011-11-29 DIAGNOSIS — Z7982 Long term (current) use of aspirin: Secondary | ICD-10-CM

## 2011-11-29 DIAGNOSIS — D45 Polycythemia vera: Secondary | ICD-10-CM

## 2011-11-29 LAB — CBC WITH DIFFERENTIAL (CANCER CENTER ONLY)
EOS%: 1.3 % (ref 0.0–7.0)
MCH: 26.4 pg — ABNORMAL LOW (ref 28.0–33.4)
MCHC: 33.6 g/dL (ref 32.0–35.9)
MONO%: 12 % (ref 0.0–13.0)
NEUT#: 5.3 10*3/uL (ref 1.5–6.5)
Platelets: 245 10*3/uL (ref 145–400)

## 2011-11-29 LAB — FERRITIN: Ferritin: 13 ng/mL — ABNORMAL LOW (ref 22–322)

## 2011-11-29 NOTE — Progress Notes (Signed)
This office note has been dictated.

## 2011-11-29 NOTE — Progress Notes (Signed)
Patient requests follow-up info put in computer on next visit, has another appointment. Edwinna Areola, [name] [employer]

## 2011-11-30 NOTE — Progress Notes (Signed)
CC:   James [name]. James [name], M.D.  DIAGNOSIS:  Polycythemia vera, JAK2 negative.  CURRENT THERAPY: 1. Phlebotomy to maintain hematocrit less than 45%. 2. Aspirin 81 mg p.o. daily.  INTERIM HISTORY:  James [name] comes in for follow-up.  He is still having a tough time with his son's passing.  It was a tough Thanksgiving for him.  He is not going to do much for Christmas.  He has had no problems with headache.  He has had no cough or shortness of breath.  He is really not exercising all that much.  He has not noted any problems with leg swelling.  He has had no fever, sweats or chills.  His last phlebotomy was actually a year ago.  PHYSICAL EXAMINATION:  General Appearance:  This is an obese white gentleman in no obvious distress.  Vital Signs:  Show a temperature of 97, pulse 96, respiratory rate 20, blood pressure 141/89.  Weight is 251.  Head and Neck Exam:  Shows a normocephalic, atraumatic skull. There are no ocular or oral lesions.  There are no palpable cervical or supraclavicular lymph nodes.  He has no conjunctival inflammation. Neck:  Supple with no lymphadenopathy.  Lungs are clear to percussion and auscultation bilaterally.  Cardiac Exam:  Regular rate and rhythm with a normal S1 and S2.  There are no murmurs, rubs or bruits. Abdominal Exam:  Soft with good bowel sounds.  There is no palpable abdominal mass.  There is no palpable hepatosplenomegaly.  Extremities: Show no clubbing, cyanosis or edema.  He does have a surgical scar on his right knee.  LABORATORY STUDIES:  White cell count is 7.4, hemoglobin 15.2, hematocrit 45.3, platelet count 245.  IMPRESSION:  James [name] is a 65 year old gentleman with polycythemia.  He has done well with this.  He has had no complications from this.  We will go ahead and phlebotomize him today.  Again, his last phlebotomy was about a year ago.  He has done very, very well.  We will go ahead and plan to get him back in another 3 months.   I think we can get him through the wintertime without having him having to come back.  He is very diligent with taking aspirin.  This is very critical for him.    ______________________________ [name], M.D. PRE/MEDQ  D:  11/29/2011  T:  11/30/2011  Job:  [phone]

## 2012-02-09 ENCOUNTER — Telehealth: Payer: Self-pay | Admitting: Hematology & Oncology

## 2012-02-09 NOTE — Telephone Encounter (Signed)
Pt aware moved 3-19 to 3-20

## 2012-03-05 ENCOUNTER — Ambulatory Visit (HOSPITAL_BASED_OUTPATIENT_CLINIC_OR_DEPARTMENT_OTHER): Payer: PRIVATE HEALTH INSURANCE | Admitting: Hematology & Oncology

## 2012-03-05 ENCOUNTER — Other Ambulatory Visit (HOSPITAL_BASED_OUTPATIENT_CLINIC_OR_DEPARTMENT_OTHER): Payer: PRIVATE HEALTH INSURANCE | Admitting: Lab

## 2012-03-05 VITALS — BP 138/86 | HR 92 | Temp 97.5°F | Wt 247.0 lb

## 2012-03-05 DIAGNOSIS — D45 Polycythemia vera: Secondary | ICD-10-CM

## 2012-03-05 LAB — CBC WITH DIFFERENTIAL (CANCER CENTER ONLY)
BASO#: 0 10*3/uL (ref 0.0–0.2)
BASO%: 0.5 % (ref 0.0–2.0)
EOS%: 2.5 % (ref 0.0–7.0)
HCT: 44 % (ref 38.7–49.9)
LYMPH%: 18.7 % (ref 14.0–48.0)
MCH: 22.6 pg — ABNORMAL LOW (ref 28.0–33.4)
MCHC: 31.1 g/dL — ABNORMAL LOW (ref 32.0–35.9)
MCV: 73 fL — ABNORMAL LOW (ref 82–98)
MONO%: 14.3 % — ABNORMAL HIGH (ref 0.0–13.0)
NEUT%: 64 % (ref 40.0–80.0)
RDW: 19.1 % — ABNORMAL HIGH (ref 11.1–15.7)

## 2012-03-05 NOTE — Progress Notes (Signed)
CC:   James [name]. James [name], M.D.  DIAGNOSES: 1. Polycythemia vera - JAK2 negative. 2. Insulin-dependent diabetes.  CURRENT THERAPY: 1. Phlebotomy to maintain a hematocrit less than 45%. 2. Aspirin 81 mg p.o. daily.  INTERIM HISTORY:  James [name] comes in for followup.  He is doing okay. It has still been tough on him with his son passing away a year ago.  It will be a year in early April.  He is working without any difficulties.  He is having no problems with nausea or vomiting.  He has had no headache.  He is on Victoza to try to help with his blood sugars.  He has not been phlebotomized probably for a couple months.  I think he was last phlebotomized back in December.  Before that, he was not phlebotomized for a year or so.  PHYSICAL EXAMINATION:  General Appearance:  This is an obese, white gentleman in no obvious distress.  Vital Signs:  97.5, pulse 92, respiratory rate 18, blood pressure 138/86.  Weight is 247.  Head and Neck Exam:  A normocephalic, atraumatic skull.  There are no ocular or oral lesions.  He has no facial plethora.  There is no conjunctival inflammation.  Neck is supple with no adenopathy.  Lungs:  Clear bilaterally.  Cardiac Exam:  Regular rate and rhythm with a normal S1 and S2.  There are no murmurs, rubs, or bruits.  Abdominal Exam:  Soft, obese.  He has good bowel sounds.  There is no fluid wave.  There is no palpable hepatosplenomegaly.  Extremities:  No clubbing, cyanosis, or edema.  Neurological Exam:  No focal neurological deficits.  LABORATORY STUDIES:  White cell count 5.9, hemoglobin 13.7, hematocrit 44, platelet count 277.  MCV is 73.  IMPRESSION:  James [name] is a 66 year old gentleman with polycythemia vera.  Again, we have been very aggressive with his phlebotomy program. We do not have to phlebotomize him today.  We will plan to get him back in 3 months.  At that point in time, he may need to be phlebotomized.  We will continue to pray  strong for him.  His son passed away unexpectedly a year ago and this is a tough time for him and his wife.    ______________________________ [name], M.D. PRE/MEDQ  D:  03/05/2012  T:  03/05/2012  Job:  [id number]

## 2012-03-05 NOTE — Progress Notes (Signed)
This office note has been dictated.

## 2012-03-06 ENCOUNTER — Ambulatory Visit: Payer: PRIVATE HEALTH INSURANCE | Admitting: Hematology & Oncology

## 2012-03-06 ENCOUNTER — Other Ambulatory Visit: Payer: PRIVATE HEALTH INSURANCE | Admitting: Lab

## 2012-05-07 ENCOUNTER — Encounter: Payer: Self-pay | Admitting: *Deleted

## 2012-05-22 ENCOUNTER — Other Ambulatory Visit (HOSPITAL_BASED_OUTPATIENT_CLINIC_OR_DEPARTMENT_OTHER): Payer: PRIVATE HEALTH INSURANCE | Admitting: Lab

## 2012-05-22 ENCOUNTER — Ambulatory Visit (HOSPITAL_BASED_OUTPATIENT_CLINIC_OR_DEPARTMENT_OTHER): Payer: PRIVATE HEALTH INSURANCE | Admitting: Hematology & Oncology

## 2012-05-22 VITALS — BP 135/83 | HR 96 | Temp 97.8°F | Ht 66.0 in | Wt 247.0 lb

## 2012-05-22 DIAGNOSIS — D45 Polycythemia vera: Secondary | ICD-10-CM

## 2012-05-22 LAB — CBC WITH DIFFERENTIAL (CANCER CENTER ONLY)
BASO%: 0.7 % (ref 0.0–2.0)
HCT: 44.1 % (ref 38.7–49.9)
LYMPH#: 1.2 10*3/uL (ref 0.9–3.3)
MONO#: 0.8 10*3/uL (ref 0.1–0.9)
NEUT%: 67.1 % (ref 40.0–80.0)
RDW: 19.3 % — ABNORMAL HIGH (ref 11.1–15.7)
WBC: 7.2 10*3/uL (ref 4.0–10.0)

## 2012-05-22 LAB — LACTATE DEHYDROGENASE: LDH: 156 U/L (ref 94–250)

## 2012-05-22 LAB — FERRITIN: Ferritin: 9 ng/mL — ABNORMAL LOW (ref 22–322)

## 2012-05-22 NOTE — Progress Notes (Signed)
This office note has been dictated.

## 2012-05-23 NOTE — Progress Notes (Signed)
CC:   James [name]. James [name], M.D.  DIAGNOSES: 1. Polycythemia vera, JAK2-negative. 2. Insulin-dependent diabetes.  CURRENT THERAPY: 1. Phlebotomy to maintain hematocrit less than 45%. 2. Aspirin 81 mg p.o. daily.  INTERIM HISTORY:  James [name] comes in for his followup.  He is doing okay.  It has still been tough year for him given that his son passed away about a year ago.  It is still very hard for him and the family.  It seems like his blood sugars might be a little bit better.  He has had no issues with this as far as he can tell.  He has not required a phlebotomy for quite awhile.  I think he was last phlebotomized back in December.  He has had no problems with nausea or vomiting.  There has been no headache.  He has had no fevers, sweats or chills.  When we last checked his ferritin back in March, it was 8.  PHYSICAL EXAMINATION:  This is an obese white gentleman in no obvious distress.  Vital signs:  A temperature of 97.8, pulse 96, respiratory rate 18, blood pressure 135/83.  Weight is 247.  Head and neck:  A normocephalic, atraumatic skull.  There are no ocular or oral lesions. There are no palpable cervical or supraclavicular lymph nodes.  Lungs: Clear bilaterally.  Cardiac:  Regular rate and rhythm with a normal S1 and S2.  There are no murmurs, rubs or bruits.  Abdomen:  Soft with good bowel sounds.  There is no palpable abdominal mass.  There is no fluid wave.  There is no palpable hepatosplenomegaly.  Extremities:  No clubbing, cyanosis or edema.  He may have some trace edema is in his lower legs.  Neurologic:  No focal neurological deficits.  LABORATORY STUDIES:  White cell count is 7.2, hemoglobin 14.1, hematocrit 44.1, platelet count is 267.  IMPRESSION:  James [name] is a 66 year old gentleman with polycythemia vera.  He has done very well with this.  Again, there have been no complications.  We will go ahead and plan to get him back in another 3 months.  We  will try to get him back after Labor Day.  I do not see that he needs any blood work in between visits.    ______________________________ [name], M.D. PRE/MEDQ  D:  05/22/2012  T:  05/23/2012  Job:  [id number]

## 2012-07-04 ENCOUNTER — Encounter: Payer: Self-pay | Admitting: Cardiology

## 2012-08-22 ENCOUNTER — Ambulatory Visit (HOSPITAL_BASED_OUTPATIENT_CLINIC_OR_DEPARTMENT_OTHER): Payer: PRIVATE HEALTH INSURANCE | Admitting: Hematology & Oncology

## 2012-08-22 ENCOUNTER — Ambulatory Visit: Payer: PRIVATE HEALTH INSURANCE

## 2012-08-22 ENCOUNTER — Other Ambulatory Visit (HOSPITAL_BASED_OUTPATIENT_CLINIC_OR_DEPARTMENT_OTHER): Payer: PRIVATE HEALTH INSURANCE | Admitting: Lab

## 2012-08-22 VITALS — BP 151/86 | HR 96 | Temp 98.0°F | Resp 20 | Ht 66.0 in | Wt 242.0 lb

## 2012-08-22 DIAGNOSIS — D45 Polycythemia vera: Secondary | ICD-10-CM

## 2012-08-22 DIAGNOSIS — E119 Type 2 diabetes mellitus without complications: Secondary | ICD-10-CM

## 2012-08-22 LAB — CBC WITH DIFFERENTIAL (CANCER CENTER ONLY)
Eosinophils Absolute: 0.1 10*3/uL (ref 0.0–0.5)
LYMPH#: 0.8 10*3/uL — ABNORMAL LOW (ref 0.9–3.3)
MCV: 73 fL — ABNORMAL LOW (ref 82–98)
MONO#: 0.8 10*3/uL (ref 0.1–0.9)
NEUT#: 4.7 10*3/uL (ref 1.5–6.5)
Platelets: 288 10*3/uL (ref 145–400)
RBC: 6.09 10*6/uL — ABNORMAL HIGH (ref 4.20–5.70)
WBC: 6.5 10*3/uL (ref 4.0–10.0)

## 2012-08-22 LAB — LACTATE DEHYDROGENASE: LDH: 145 U/L (ref 94–250)

## 2012-08-22 LAB — FERRITIN: Ferritin: 7 ng/mL — ABNORMAL LOW (ref 22–322)

## 2012-08-22 NOTE — Progress Notes (Signed)
This office note has been dictated.

## 2012-08-23 NOTE — Progress Notes (Signed)
CC:   James [name]. James [name], M.D.  DIAGNOSES: 1. Polycythemia vera, JAK2 negative. 2. Insulin dependent diabetes.  CURRENT THERAPY: 1. Phlebotomy to maintain hematocrit less than 45%. 2. Aspirin 81 mg p.o. daily.  INTERIM HISTORY:  Mr.  [name] comes in for followup.  We see him every 3 months.  He is doing quite well right now.  He has had a good summer so far.  His last phlebotomy was, I think, back in December.  When we last saw him in June, his ferritin was 9.  He feels well.  He has had no problems with diabetes.  He has had no fevers, sweats or chills.  He has had no headache.  PHYSICAL EXAMINATION:  This is a mildly obese white gentleman in no obvious distress.  Vital signs:  Temperature of 98, pulse 96, respiratory rate 20, blood pressure is 133/89.  Weight is 242.  Head and neck:  Normocephalic, atraumatic skull.  There are no ocular or oral lesions.  There are no palpable cervical or supraclavicular lymph nodes. Lungs:  Clear bilaterally.  Cardiac:  Shows a regular rate and rhythm with a normal S1 and S2.  There are no murmurs, rubs or bruits. Abdomen:  Soft with good bowel sounds.  He is mildly obese.  There is no palpable hepatosplenomegaly. Extremities:  No clubbing, cyanosis or edema.  Neurological:  No focal neurological deficits.  LABORATORY STUDIES:  White cell count is 6.5, hemoglobin 14.2, hematocrit 44.2, platelet count 288.  MCV is 73.  IMPRESSION:  James [name] is a 66 year old gentleman with polycythemia vera.  Again, he has not been phlebotomized since December.  He has done very, very well overall.  His  hematocrit is creeping up a bit.  However, I still think we can hold off on his phlebotomy for now.  We will plan to get him back now after Thanksgiving.  I do not see need for blood work in between visits.    ______________________________ [name], M.D. PRE/MEDQ  D:  08/22/2012  T:  08/23/2012  Job:  [id number]

## 2012-10-07 ENCOUNTER — Emergency Department (HOSPITAL_COMMUNITY)
Admission: EM | Admit: 2012-10-07 | Discharge: 2012-10-07 | Disposition: A | Discharge status: Home / Self Care | Payer: No Typology Code available for payment source | Attending: Emergency Medicine | Admitting: Emergency Medicine

## 2012-10-07 ENCOUNTER — Emergency Department (HOSPITAL_COMMUNITY): Payer: No Typology Code available for payment source

## 2012-10-07 ENCOUNTER — Encounter (HOSPITAL_COMMUNITY): Payer: Self-pay | Admitting: *Deleted

## 2012-10-07 DIAGNOSIS — Z79899 Other long term (current) drug therapy: Secondary | ICD-10-CM | POA: Insufficient documentation

## 2012-10-07 DIAGNOSIS — I10 Essential (primary) hypertension: Secondary | ICD-10-CM | POA: Insufficient documentation

## 2012-10-07 DIAGNOSIS — S161XXA Strain of muscle, fascia and tendon at neck level, initial encounter: Secondary | ICD-10-CM

## 2012-10-07 DIAGNOSIS — S39012A Strain of muscle, fascia and tendon of lower back, initial encounter: Secondary | ICD-10-CM

## 2012-10-07 DIAGNOSIS — Y9389 Activity, other specified: Secondary | ICD-10-CM | POA: Insufficient documentation

## 2012-10-07 DIAGNOSIS — S335XXA Sprain of ligaments of lumbar spine, initial encounter: Secondary | ICD-10-CM | POA: Insufficient documentation

## 2012-10-07 DIAGNOSIS — Z7982 Long term (current) use of aspirin: Secondary | ICD-10-CM | POA: Insufficient documentation

## 2012-10-07 DIAGNOSIS — Z862 Personal history of diseases of the blood and blood-forming organs and certain disorders involving the immune mechanism: Secondary | ICD-10-CM | POA: Insufficient documentation

## 2012-10-07 DIAGNOSIS — E119 Type 2 diabetes mellitus without complications: Secondary | ICD-10-CM | POA: Insufficient documentation

## 2012-10-07 DIAGNOSIS — S139XXA Sprain of joints and ligaments of unspecified parts of neck, initial encounter: Secondary | ICD-10-CM | POA: Insufficient documentation

## 2012-10-07 MED ORDER — OXYCODONE-ACETAMINOPHEN 5-325 MG PO TABS: 1.0000 | ORAL_TABLET | ORAL | Status: DC | PRN

## 2012-10-07 MED ORDER — OXYCODONE-ACETAMINOPHEN 5-325 MG PO TABS
2.0000 | ORAL_TABLET | Freq: Once | ORAL | Status: AC
  Administered 2012-10-07: 2 via ORAL
  Filled 2012-10-07 (×2): qty 1
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
## 2012-11-18 ENCOUNTER — Ambulatory Visit: Payer: PRIVATE HEALTH INSURANCE

## 2012-11-18 ENCOUNTER — Other Ambulatory Visit (HOSPITAL_BASED_OUTPATIENT_CLINIC_OR_DEPARTMENT_OTHER): Payer: PRIVATE HEALTH INSURANCE | Admitting: Lab

## 2012-11-18 ENCOUNTER — Ambulatory Visit (HOSPITAL_BASED_OUTPATIENT_CLINIC_OR_DEPARTMENT_OTHER): Payer: PRIVATE HEALTH INSURANCE | Admitting: Hematology & Oncology

## 2012-11-18 VITALS — BP 117/65 | HR 102 | Temp 98.2°F | Resp 20 | Ht 66.0 in | Wt 236.0 lb

## 2012-11-18 DIAGNOSIS — E119 Type 2 diabetes mellitus without complications: Secondary | ICD-10-CM

## 2012-11-18 DIAGNOSIS — D45 Polycythemia vera: Secondary | ICD-10-CM

## 2012-11-18 LAB — CBC WITH DIFFERENTIAL (CANCER CENTER ONLY)
BASO%: 0.4 % (ref 0.0–2.0)
HCT: 44 % (ref 38.7–49.9)
LYMPH%: 11 % — ABNORMAL LOW (ref 14.0–48.0)
MCV: 71 fL — ABNORMAL LOW (ref 82–98)
MONO#: 0.7 10*3/uL (ref 0.1–0.9)
NEUT%: 76.1 % (ref 40.0–80.0)
RDW: 19.1 % — ABNORMAL HIGH (ref 11.1–15.7)
WBC: 7.1 10*3/uL (ref 4.0–10.0)

## 2012-11-18 NOTE — Progress Notes (Signed)
Patient did not required any phlebotomy today per doctor orders

## 2012-11-18 NOTE — Progress Notes (Signed)
This office note has been dictated.
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
## 2013-02-17 ENCOUNTER — Other Ambulatory Visit: Payer: PRIVATE HEALTH INSURANCE | Admitting: Lab

## 2013-02-17 ENCOUNTER — Telehealth: Payer: Self-pay | Admitting: Hematology & Oncology

## 2013-02-17 ENCOUNTER — Ambulatory Visit: Payer: PRIVATE HEALTH INSURANCE | Admitting: Medical

## 2013-02-17 NOTE — Telephone Encounter (Signed)
Patient called and cx 02-17-13 apt due to wanting to MD.  He resch for 03/25/13.  Manuela Schwartz was notified of cx apt

## 2013-03-25 ENCOUNTER — Ambulatory Visit (HOSPITAL_BASED_OUTPATIENT_CLINIC_OR_DEPARTMENT_OTHER): Payer: PRIVATE HEALTH INSURANCE | Admitting: Hematology & Oncology

## 2013-03-25 ENCOUNTER — Other Ambulatory Visit (HOSPITAL_BASED_OUTPATIENT_CLINIC_OR_DEPARTMENT_OTHER): Payer: PRIVATE HEALTH INSURANCE | Admitting: Lab

## 2013-03-25 ENCOUNTER — Ambulatory Visit: Payer: PRIVATE HEALTH INSURANCE

## 2013-03-25 VITALS — BP 128/71 | HR 81 | Temp 98.7°F | Resp 16 | Ht 66.0 in | Wt 234.0 lb

## 2013-03-25 DIAGNOSIS — D45 Polycythemia vera: Secondary | ICD-10-CM

## 2013-03-25 LAB — CBC WITH DIFFERENTIAL (CANCER CENTER ONLY)
BASO#: 0 10*3/uL (ref 0.0–0.2)
EOS%: 2 % (ref 0.0–7.0)
HCT: 44.9 % (ref 38.7–49.9)
HGB: 13.7 g/dL (ref 13.0–17.1)
LYMPH%: 14.1 % (ref 14.0–48.0)
MCH: 21 pg — ABNORMAL LOW (ref 28.0–33.4)
MCHC: 30.5 g/dL — ABNORMAL LOW (ref 32.0–35.9)
MCV: 69 fL — ABNORMAL LOW (ref 82–98)
MONO%: 11.5 % (ref 0.0–13.0)
NEUT#: 4.6 10*3/uL (ref 1.5–6.5)
NEUT%: 71.8 % (ref 40.0–80.0)

## 2013-03-25 NOTE — Progress Notes (Signed)
Patient does not want phlebotomy today, Hgb 44.9,seen by Dr. [name]. Edwinna Areola, Shavette Shoaff [employer]

## 2013-03-25 NOTE — Progress Notes (Signed)
This office note has been dictated.

## 2013-03-27 NOTE — Progress Notes (Signed)
CC:   Ermalene Searing. Philip Aspen, M.D.  DIAGNOSIS:  Polycythemia vera, JAK2 negative.  CURRENT THERAPY: 1. Phlebotomy to maintain hematocrit less than 45%. 2. Aspirin 81 mg p.o. daily.  INTERVAL HISTORY:  [name] comes in for followup.  He is doing okay. He has had no problems with headache or blurred vision.  He is still working without any difficulties.  He is getting ready to go to [place] for Easter.  He is looking forward to this.  He has had no problems with pain in the hands or feet.  He has had no change in bowel or bladder habits.  His iron studies clearly show iron deficiency.  His last ferritin was 5 that we checked back in December.  PHYSICAL EXAMINATION:  General:  This is an obese white gentleman in no obvious distress.  Vital signs:  Temperature of 98.7, pulse 81, respiratory rate 18, blood pressure 128/71.  Weight is 234.  Head and neck:  Normocephalic, atraumatic skull.  There are no ocular or oral lesions.  There are no palpable cervical or supraclavicular lymph nodes. Lungs:  Clear bilaterally.  Cardiac:  Regular rate and rhythm, with a normal S1, S2.  There are no murmurs, rubs, or bruits.  Abdomen:  Soft, with good bowel sounds.  There is no palpable abdominal mass.  There is no fluid wave.  No palpable hepatosplenomegaly is noted.  Back:  No tenderness over the spine, ribs, or hips.  Extremities:  Show no clubbing, cyanosis or edema.  Neurological:  Shows no focal neurological deficits.  LABORATORY STUDIES:  White cell count 6.4, hemoglobin 13.7, hematocrit 44.9, platelet count 252,000.  IMPRESSION:  [name] is a 67 year old gentleman with polycythemia.  He does not want to be phlebotomized today.  I think we are okay with not having to phlebotomize him.  We will go ahead and get him back in 4 more months.  By then, we probably will need to phlebotomize him.  He is iron deficient.  He is watching his diet.  He is trying to exercise  more.   ______________________________ [name], M.D. PRE/MEDQ  D:  03/25/2013  T:  03/26/2013  Job:  [id number]

## 2013-07-02 ENCOUNTER — Telehealth: Payer: Self-pay | Admitting: Hematology & Oncology

## 2013-07-02 NOTE — Telephone Encounter (Signed)
Pt moved 8-13 to 8-22 needed to have 8 am

## 2013-07-30 ENCOUNTER — Ambulatory Visit: Payer: PRIVATE HEALTH INSURANCE | Admitting: Hematology & Oncology

## 2013-07-30 ENCOUNTER — Other Ambulatory Visit: Payer: PRIVATE HEALTH INSURANCE | Admitting: Lab

## 2013-08-08 ENCOUNTER — Other Ambulatory Visit (HOSPITAL_BASED_OUTPATIENT_CLINIC_OR_DEPARTMENT_OTHER): Payer: PRIVATE HEALTH INSURANCE | Admitting: Lab

## 2013-08-08 ENCOUNTER — Ambulatory Visit (HOSPITAL_BASED_OUTPATIENT_CLINIC_OR_DEPARTMENT_OTHER): Payer: PRIVATE HEALTH INSURANCE | Admitting: Hematology & Oncology

## 2013-08-08 ENCOUNTER — Ambulatory Visit: Payer: PRIVATE HEALTH INSURANCE

## 2013-08-08 VITALS — BP 144/85 | HR 78 | Temp 98.1°F | Resp 18 | Ht 66.0 in | Wt 235.0 lb

## 2013-08-08 DIAGNOSIS — D45 Polycythemia vera: Secondary | ICD-10-CM

## 2013-08-08 LAB — CBC WITH DIFFERENTIAL (CANCER CENTER ONLY)
BASO%: 0.7 % (ref 0.0–2.0)
LYMPH%: 15.9 % (ref 14.0–48.0)
MCH: 21.5 pg — ABNORMAL LOW (ref 28.0–33.4)
MCV: 70 fL — ABNORMAL LOW (ref 82–98)
MONO%: 14.3 % — ABNORMAL HIGH (ref 0.0–13.0)
NEUT%: 66.5 % (ref 40.0–80.0)
Platelets: 258 10*3/uL (ref 145–400)
RDW: 20.9 % — ABNORMAL HIGH (ref 11.1–15.7)

## 2013-08-08 LAB — IRON AND TIBC CHCC: %SAT: 6 % — ABNORMAL LOW (ref 20–55)

## 2013-08-08 LAB — FERRITIN CHCC: Ferritin: 7 ng/ml — ABNORMAL LOW (ref 22–316)

## 2013-08-08 NOTE — Progress Notes (Signed)
No phlebotomy today per dr. [name]

## 2013-08-08 NOTE — Progress Notes (Signed)
This office note has been dictated.

## 2013-08-09 NOTE — Progress Notes (Signed)
CC:   James [name]. James [name], M.D.  DIAGNOSIS:  Polycythemia vera, JAK2 negative.  CURRENT THERAPY: 1. Phlebotomy to maintain hematocrit less than 45%. 2. Aspirin 81 mg p.o. q. day.  INTERIM HISTORY:  Ms. James [name] comes in for followup.  We see him every 4 months.  He is doing fairly well.  He has had no problems since we last saw him.  He has had no nausea and vomiting.  There has been no headache.  He has had no pain in his hands or feet.  He has had no rashes.  There has been no change in bowel or bladder habits.  His last ferritin back in April of this year was 8.  We have not phlebotomized him probably for a year or more.  PHYSICAL EXAM:  General:  This is an obese white gentleman in no obvious distress.  Vital Signs:  Show a temperature of 98.1, pulse 78, respiratory rate 18, blood pressure 144/85.  Weight is 235.  Head and Neck:  Show a normocephalic, atraumatic skull.  There are no ocular or oral lesions.  There are no palpable cervical or supraclavicular lymph nodes.  Lungs:  Clear bilaterally.  Cardiac:  Regular rate and rhythm with a normal S1 and S2.  There are no murmurs, rubs, or bruits. Abdomen:  Soft.  He has good bowel sounds.  There is no fluid wave. There is no palpable hepatosplenomegaly.  Extremities:  Show no clubbing, cyanosis, or edema.  Neurological:  Shows no focal neurological deficits.  Skin:  No rashes, ecchymosis, or petechia.  LABORATORY STUDIES:  White cell count is 5.7.  Hemoglobin 13.8, hematocrit 44.4, platelet count 258.  IMPRESSION:  James [name] is a very nice 67 year old gentleman with polycythemia vera.  He has had no complications from this.  We are being aggressive with phlebotomizing him.  We will go ahead and plan to get him back in another 4 months.  Again, his blood count really is holding stable.  I think a lot of this has to do with the fact that he is iron deficient.    ______________________________ [name], M.D. PRE/MEDQ   D:  08/08/2013  T:  08/09/2013  Job:  [id number]

## 2013-12-01 ENCOUNTER — Ambulatory Visit: Payer: PRIVATE HEALTH INSURANCE | Admitting: Hematology & Oncology

## 2013-12-01 ENCOUNTER — Telehealth: Payer: Self-pay | Admitting: Hematology & Oncology

## 2013-12-01 ENCOUNTER — Other Ambulatory Visit (HOSPITAL_BASED_OUTPATIENT_CLINIC_OR_DEPARTMENT_OTHER): Payer: PRIVATE HEALTH INSURANCE | Admitting: Lab

## 2013-12-01 DIAGNOSIS — D45 Polycythemia vera: Secondary | ICD-10-CM

## 2013-12-01 NOTE — Telephone Encounter (Signed)
Patient came to apt today but decided he had to leave and resch for 01/07/13.  Nurse was notified of cx apt

## 2014-01-07 ENCOUNTER — Encounter: Payer: Self-pay | Admitting: Hematology & Oncology

## 2014-01-07 ENCOUNTER — Ambulatory Visit: Payer: PRIVATE HEALTH INSURANCE

## 2014-01-07 ENCOUNTER — Other Ambulatory Visit (HOSPITAL_BASED_OUTPATIENT_CLINIC_OR_DEPARTMENT_OTHER): Payer: PRIVATE HEALTH INSURANCE | Admitting: Lab

## 2014-01-07 ENCOUNTER — Ambulatory Visit (HOSPITAL_BASED_OUTPATIENT_CLINIC_OR_DEPARTMENT_OTHER): Payer: PRIVATE HEALTH INSURANCE | Admitting: Hematology & Oncology

## 2014-01-07 VITALS — BP 117/68 | HR 89 | Temp 98.0°F | Resp 18 | Ht 65.0 in | Wt 244.0 lb

## 2014-01-07 DIAGNOSIS — D45 Polycythemia vera: Secondary | ICD-10-CM

## 2014-01-07 LAB — CBC WITH DIFFERENTIAL (CANCER CENTER ONLY)
BASO#: 0 10*3/uL (ref 0.0–0.2)
BASO%: 0.3 % (ref 0.0–2.0)
EOS%: 3 % (ref 0.0–7.0)
Eosinophils Absolute: 0.2 10*3/uL (ref 0.0–0.5)
HCT: 47 % (ref 38.7–49.9)
HGB: 14.1 g/dL (ref 13.0–17.1)
LYMPH#: 0.9 10*3/uL (ref 0.9–3.3)
LYMPH%: 15.9 % (ref 14.0–48.0)
MCH: 21.2 pg — ABNORMAL LOW (ref 28.0–33.4)
MCHC: 30 g/dL — ABNORMAL LOW (ref 32.0–35.9)
MCV: 71 fL — AB (ref 82–98)
MONO#: 0.8 10*3/uL (ref 0.1–0.9)
MONO%: 13.4 % — AB (ref 0.0–13.0)
NEUT%: 67.4 % (ref 40.0–80.0)
NEUTROS ABS: 3.9 10*3/uL (ref 1.5–6.5)
PLATELETS: 265 10*3/uL (ref 145–400)
RBC: 6.65 10*6/uL — ABNORMAL HIGH (ref 4.20–5.70)
RDW: 20.8 % — AB (ref 11.1–15.7)
WBC: 5.7 10*3/uL (ref 4.0–10.0)

## 2014-01-07 NOTE — Progress Notes (Signed)
Pt seen by Dr. [name] today, phlebotomy rescheduled for next week.

## 2014-01-07 NOTE — Progress Notes (Signed)
This office note has been dictated.

## 2014-01-08 ENCOUNTER — Telehealth: Payer: Self-pay | Admitting: Hematology & Oncology

## 2014-01-08 NOTE — Telephone Encounter (Signed)
Left pt message to call and schedule appointment

## 2014-01-08 NOTE — Progress Notes (Signed)
CC:   James [name]. James [name], M.D.  DIAGNOSIS:  Polycythemia vera-JAK2 negative.  CURRENT THERAPY: 1. Phlebotomy to maintain hematocrit less than 45%. 2. Aspirin 81 mg p.o. daily.  INTERIM HISTORY:  James [name] comes in for followup.  He is doing fairly well.  He had a good Christmas and New Year's holiday.  He has had no other new problems with headaches or blurred vision.  __________ He has gained some weight.  He has had no cough or shortness of breath.  He got through the winter so far without the flu.  When we last saw him, his ferritin was 7.  PHYSICAL EXAMINATION:  General: This is an obese white gentleman, in no obvious distress.  Vital Signs:  Temperature of 98, pulse 89, respiratory rate 18, blood pressure 117/68.  Weight is 244 pounds.  Head and Neck:  Normocephalic, atraumatic skull.  There are no ocular or oral lesions.  There are no palpable cervical or supraclavicular lymph nodes. Lungs:  Clear bilaterally.  Cardiac:  Regular rate and rhythm with normal S1, S2.  There are no murmurs, rubs, or bruits.  Abdomen:  Soft. He has good bowel sounds.  He is somewhat obese.  He has no fluid wave. There is no palpable hepatosplenomegaly.  Back:  No tenderness over the spine, ribs, or hips.  Extremities:  No clubbing, cyanosis, or edema. Neurological:  No focal neurological deficits.  LABORATORY STUDIES:  White cell count 5.7, hemoglobin 14, hematocrit 47, platelet count 265.  MCV is 71.  IMPRESSION:  James [name] is a 69 year old gentleman.  He has polycythemia. We will have to phlebotomize him.  It is 5 __________ since he was last phlebotomized.  We will go ahead and get him set up for this in the next week or so.  I will plan to see him back in 3 months.   ______________________________ [name], M.D. PRE/MEDQ  D:  01/07/2014  T:  01/08/2014  Job:  [id number]

## 2014-01-08 NOTE — Telephone Encounter (Signed)
Pt aware of 1-23 and 4-22 appointments

## 2014-01-09 ENCOUNTER — Ambulatory Visit (HOSPITAL_BASED_OUTPATIENT_CLINIC_OR_DEPARTMENT_OTHER): Payer: PRIVATE HEALTH INSURANCE

## 2014-01-09 VITALS — BP 128/70 | HR 79 | Temp 97.1°F | Resp 18

## 2014-01-09 DIAGNOSIS — D45 Polycythemia vera: Secondary | ICD-10-CM

## 2014-01-09 NOTE — Patient Instructions (Signed)
Therapeutic Phlebotomy Care After Refer to this sheet in the next few weeks. These instructions provide you with information on caring for yourself after your procedure. Your caregiver may also give you more specific instructions. Your treatment has been planned according to current medical practices, but problems sometimes occur. Call your caregiver if you have any problems or questions after your procedure. HOME CARE INSTRUCTIONS Most people can go back to their normal activities right away. Before you leave, be sure to ask if there is anything you should or should not do. In general, it would be wise to:  Keep the bandage dry. You can remove the bandage after about 5 hours.  Eat well-balanced meals for the next 24 hours.  Drink enough fluids to keep your urine clear or pale yellow.  Avoid drinking alcohol minimally until after eating.  Avoid smoking for at least 30 minutes after the procedure.  Avoid strenous physical activity or heavy lifting or pulling for about 5 hours after the procedure.  Athletes should avoid strenous exercise for 12 hours or more.  Change positions slowly for the remainder of the day to prevent lightheadedness or fainting.  If you feel lightheaded, lie down until the feeling subsides.  If you have bleeding from the needle insertion site, elevate your arm and press firmly on the site until the bleeding stops.  If bruising or bleeding appears under the skin, apply ice to the area for 15 to 20 minutes, 3 to 4 times per day. Put the ice in a plastic bag and place a towel between the bag of ice and your skin. Do this while you are awake for the first 24 hours. The ice packs can be stopped before 24 hours if the swelling goes away. If swelling persists after 24 hours, a warm, moist washcloth can be applied to the area for 15 to 20 minutes, 3 to 4 times per day. The warm, moist treatments can be stopped when the swelling goes away.  It is important to continue further  therapeutic phlebotomy as directed by your caregiver. SEEK MEDICAL CARE IF:  There is bleeding or fluid leaking from the needle insertion site.  The needle insertion site becomes swollen, red, or sore.  You feel lightheaded, dizzy or nauseated, and the feeling does not go away.  You notice new bruising at the needle insertion site.  You feel more weak or tired than normal.  You develop a fever. SEEK IMMEDIATE MEDICAL CARE IF:   There is increased bleeding, pain, or swelling from the needle insertion site.  You have severe nausea or vomiting.  You have chest pain.  You have trouble breathing. MAKE SURE YOU:  Understand these instructions.  Will watch your condition.  Will get help right away if you are not doing well or get worse. Document Released: 05/08/2011 Document Revised: 02/26/2012 Document Reviewed: 05/08/2011 [facility] Patient Information 2014 [place], [place].

## 2014-01-09 NOTE — Progress Notes (Signed)
James [name]. presents today for phlebotomy per MD orders. Phlebotomy procedure started at [place] and ended at 11. Approximately 500 mls removed. Patient observed for 30 minutes after procedure without any incident. Patient tolerated procedure well. IV needle removed intact.

## 2014-04-03 DIAGNOSIS — Z8619 Personal history of other infectious and parasitic diseases: Secondary | ICD-10-CM

## 2014-04-03 HISTORY — DX: Personal history of other infectious and parasitic diseases: Z86.19

## 2014-04-08 ENCOUNTER — Ambulatory Visit (HOSPITAL_BASED_OUTPATIENT_CLINIC_OR_DEPARTMENT_OTHER): Payer: PRIVATE HEALTH INSURANCE | Admitting: Hematology & Oncology

## 2014-04-08 ENCOUNTER — Other Ambulatory Visit (HOSPITAL_BASED_OUTPATIENT_CLINIC_OR_DEPARTMENT_OTHER): Payer: PRIVATE HEALTH INSURANCE | Admitting: Lab

## 2014-04-08 ENCOUNTER — Encounter: Payer: Self-pay | Admitting: Hematology & Oncology

## 2014-04-08 VITALS — BP 123/58 | HR 93 | Temp 98.6°F | Resp 18 | Ht 65.0 in | Wt 242.0 lb

## 2014-04-08 DIAGNOSIS — D45 Polycythemia vera: Secondary | ICD-10-CM

## 2014-04-08 DIAGNOSIS — B029 Zoster without complications: Secondary | ICD-10-CM

## 2014-04-08 LAB — CBC WITH DIFFERENTIAL (CANCER CENTER ONLY)
BASO#: 0 10*3/uL (ref 0.0–0.2)
BASO%: 0.9 % (ref 0.0–2.0)
EOS%: 3.8 % (ref 0.0–7.0)
Eosinophils Absolute: 0.2 10*3/uL (ref 0.0–0.5)
HEMATOCRIT: 43.4 % (ref 38.7–49.9)
HGB: 13.4 g/dL (ref 13.0–17.1)
LYMPH#: 0.8 10*3/uL — AB (ref 0.9–3.3)
LYMPH%: 18.5 % (ref 14.0–48.0)
MCH: 20.6 pg — ABNORMAL LOW (ref 28.0–33.4)
MCHC: 30.9 g/dL — ABNORMAL LOW (ref 32.0–35.9)
MCV: 67 fL — ABNORMAL LOW (ref 82–98)
MONO#: 0.9 10*3/uL (ref 0.1–0.9)
MONO%: 20.3 % — ABNORMAL HIGH (ref 0.0–13.0)
NEUT#: 2.5 10*3/uL (ref 1.5–6.5)
NEUT%: 56.5 % (ref 40.0–80.0)
RBC: 6.5 10*6/uL — ABNORMAL HIGH (ref 4.20–5.70)
RDW: 21.1 % — AB (ref 11.1–15.7)
WBC: 4.5 10*3/uL (ref 4.0–10.0)

## 2014-04-08 LAB — IRON AND TIBC CHCC
%SAT: 5 % — ABNORMAL LOW (ref 20–55)
Iron: 23 ug/dL — ABNORMAL LOW (ref 42–163)
TIBC: 439 ug/dL — AB (ref 202–409)
UIBC: 416 ug/dL — AB (ref 117–376)

## 2014-04-08 LAB — FERRITIN CHCC: FERRITIN: 10 ng/mL — AB (ref 22–316)

## 2014-04-08 NOTE — Progress Notes (Signed)
Hematology and Oncology Follow Up Visit  James [name] [id number] 01/22/1946 68 y.o. 04/08/2014   Principle Diagnosis:   Polycythemia vera-JAK2 negative  Herpes zoster of the right T3 dermatome  Current Therapy:    Phlebotomy to maintain hematocrit below 45%.  Aspirin 81 mg by mouth daily     Interim History:  Mr.  [name] is is in for followup. And 40, he was done in [place] over the weekend. He developed shingles in the right T3 dermatome. [facility] in [place]. He was put on acyclovir. He's also on Neurontin. The lesions are drying up now. They still are quite painful. Hopefully, the Neurontin will help with the herpetic neuralgia.  Otherwise, has been doing okay. He's working well. Does have no problems with fatigue or weakness. That he is diabetic. His watch his blood sugars.  He is iron deficient. His last ferritin back in August of last year was 7.  Medications: Current outpatient prescriptions:acyclovir (ZOVIRAX) 800 MG tablet, Take 800 mg by mouth 2 (two) times daily. , Disp: , Rfl: ;  aspirin EC 81 MG tablet, Take 81 mg by mouth daily., Disp: , Rfl: ;  desmopressin (DDAVP) 0.2 MG tablet, Take 0.2 mg by mouth at bedtime. , Disp: , Rfl: ;  gabapentin (NEURONTIN) 300 MG capsule, Take 300 mg by mouth 2 (two) times daily. , Disp: , Rfl:  glimepiride (AMARYL) 2 MG tablet, Take 2 mg by mouth. Pt not sure how he takes them, Disp: , Rfl: ;  meloxicam (MOBIC) 7.5 MG tablet, Take 7.5 mg by mouth as needed. , Disp: , Rfl: ;  metFORMIN (GLUMETZA) 1000 MG (MOD) 24 hr tablet, Take 1,000 mg by mouth 2 (two) times daily with a meal. , Disp: , Rfl: ;  nortriptyline (PAMELOR) 25 MG capsule, Take 25 mg by mouth. Pt not sure how he takes them, Disp: , Rfl:  omeprazole (PRILOSEC) 20 MG capsule, Take 20 mg by mouth daily.  , Disp: , Rfl: ;  ONE TOUCH ULTRA TEST test strip, by Other route as needed. , Disp: , Rfl: ;  rosuvastatin (CRESTOR) 20 MG tablet, Take 20 mg by mouth daily.  , Disp: ,  Rfl: ;  testosterone cypionate (DEPOTESTOTERONE CYPIONATE) 200 MG/ML injection, Inject into the muscle every 21 ( twenty-one) days.  , Disp: , Rfl:  valsartan-hydrochlorothiazide (DIOVAN-HCT) 320-25 MG per tablet, Take 1 tablet by mouth daily. , Disp: , Rfl: ;  oxyCODONE-acetaminophen (PERCOCET/ROXICET) 5-325 MG per tablet, Take 1 tablet by mouth as needed for pain (Take 1- 2 tablets every 4 - 6 hours as needed for pain.)., Disp: , Rfl:   Allergies: No Known Allergies  Past Medical History, Surgical history, Social history, and Family History were reviewed and updated.  Review of Systems: As above  Physical Exam:  height is 5' 5"  (1.651 m) and weight is 242 lb (109.77 kg). His oral temperature is 98.6 F (37 C). His blood pressure is 123/58 and his pulse is 93. His respiration is 18.   Obese white him in. In the right T3 dermatome, he has the red vesicular rash. The blisters appear to be drying up. His lungs are clear. Cardiac exam regular in rhythm. Abdomen is obese but soft. Has good bowel sounds. There is no fluid wave. There is no palpable hepatosplenomegaly. Back exam shows no tenderness over the spine ribs or hips. Extremities shows no clubbing cyanosis or edema. Skin exam shows a zoster rash in the right T3 dermatome. Neurological exam  shows no focal neurological deficits.  Lab Results  Component Value Date   WBC 4.5 04/08/2014   HGB 13.4 04/08/2014   HCT 43.4 04/08/2014   MCV 67* 04/08/2014   PLT 177 Large platelets present 04/08/2014     Chemistry      Component Value Date/Time   NA 137 08/12/2010 0755   K 4.2 08/12/2010 0755   CL 105 08/12/2010 0755   CO2 23 08/12/2010 0755   BUN 18 08/12/2010 0755   CREATININE 1.17 08/12/2010 0755      Component Value Date/Time   CALCIUM 9.5 08/12/2010 0755   ALKPHOS 76 08/12/2010 0755   AST 26 08/12/2010 0755   ALT 29 08/12/2010 0755   BILITOT 0.4 08/12/2010 0755         Impression and Plan: James [name] is 68 year old gentleman with  polycythemia. He is 90 be phlebotomized today. He is iron deficient. His MCV is only 67.  He is on a cycler for the shingles. The lesions are drying up. I don't believe that he is contagious by any means.  I think we'll probably get him back to see [place] in about 4 months now.   [name], MD 4/22/20159:12 AM

## 2014-08-05 ENCOUNTER — Ambulatory Visit (HOSPITAL_BASED_OUTPATIENT_CLINIC_OR_DEPARTMENT_OTHER): Payer: PRIVATE HEALTH INSURANCE | Admitting: Hematology & Oncology

## 2014-08-05 ENCOUNTER — Ambulatory Visit: Payer: PRIVATE HEALTH INSURANCE

## 2014-08-05 ENCOUNTER — Other Ambulatory Visit (HOSPITAL_BASED_OUTPATIENT_CLINIC_OR_DEPARTMENT_OTHER): Payer: PRIVATE HEALTH INSURANCE | Admitting: Lab

## 2014-08-05 ENCOUNTER — Encounter: Payer: Self-pay | Admitting: Hematology & Oncology

## 2014-08-05 VITALS — BP 126/73 | HR 82 | Temp 98.1°F | Resp 18 | Ht 65.0 in | Wt 245.0 lb

## 2014-08-05 DIAGNOSIS — D45 Polycythemia vera: Secondary | ICD-10-CM

## 2014-08-05 LAB — CBC WITH DIFFERENTIAL (CANCER CENTER ONLY)
BASO#: 0.1 10*3/uL (ref 0.0–0.2)
BASO%: 0.9 % (ref 0.0–2.0)
EOS%: 3.3 % (ref 0.0–7.0)
Eosinophils Absolute: 0.2 10*3/uL (ref 0.0–0.5)
HCT: 43.3 % (ref 38.7–49.9)
HGB: 13.1 g/dL (ref 13.0–17.1)
LYMPH#: 0.9 10*3/uL (ref 0.9–3.3)
LYMPH%: 16.4 % (ref 14.0–48.0)
MCH: 20.6 pg — AB (ref 28.0–33.4)
MCHC: 30.3 g/dL — ABNORMAL LOW (ref 32.0–35.9)
MCV: 68 fL — AB (ref 82–98)
MONO#: 0.7 10*3/uL (ref 0.1–0.9)
MONO%: 12.9 % (ref 0.0–13.0)
NEUT%: 66.5 % (ref 40.0–80.0)
NEUTROS ABS: 3.7 10*3/uL (ref 1.5–6.5)
PLATELETS: 264 10*3/uL (ref 145–400)
RBC: 6.37 10*6/uL — ABNORMAL HIGH (ref 4.20–5.70)
RDW: 20.3 % — AB (ref 11.1–15.7)
WBC: 5.5 10*3/uL (ref 4.0–10.0)

## 2014-08-05 LAB — IRON AND TIBC CHCC
%SAT: 6 % — ABNORMAL LOW (ref 20–55)
IRON: 27 ug/dL — AB (ref 42–163)
TIBC: 429 ug/dL — AB (ref 202–409)
UIBC: 402 ug/dL — ABNORMAL HIGH (ref 117–376)

## 2014-08-05 LAB — FERRITIN CHCC: Ferritin: 6 ng/ml — ABNORMAL LOW (ref 22–316)

## 2014-08-05 NOTE — Progress Notes (Signed)
Hematology and Oncology Follow Up Visit  James [name] [id number] 1946-04-11 68 y.o. 08/05/2014   Principle Diagnosis:   Polycythemia vera-JAK2 negative  Herpes zoster of the right T3 dermatome  Current Therapy:    Phlebotomy to maintain hematocrit below 45%.  Aspirin 81 mg by mouth daily     Interim History:  Mr.  James [name] is back for followup. 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 He had this in the right T3 dermatome. This probably was about 6 months ago. He still is bothered but with neuralgia. He is on Neurontin for this. He says it helps a little bit.  Otherwise, he's been doing okay. His blood sugars have been a little on the high side.  We've not had a phlebotomize him now for about 6 or 7 months. He had iron deficient which is helping.  Medications: Current outpatient prescriptions:aspirin EC 81 MG tablet, Take 81 mg by mouth daily., Disp: , Rfl: ;  desmopressin (DDAVP) 0.2 MG tablet, Take 0.2 mg by mouth at bedtime. , Disp: , Rfl: ;  gabapentin (NEURONTIN) 300 MG capsule, Take 300 mg by mouth 2 (two) times daily. , Disp: , Rfl: ;  glimepiride (AMARYL) 2 MG tablet, Take 2 mg by mouth. Pt not sure how he takes them, Disp: , Rfl:  meloxicam (MOBIC) 7.5 MG tablet, Take 7.5 mg by mouth as needed. , Disp: , Rfl: ;  metFORMIN (GLUMETZA) 1000 MG (MOD) 24 hr tablet, Take 1,000 mg by mouth 2 (two) times daily with a meal. , Disp: , Rfl: ;  omeprazole (PRILOSEC) 20 MG capsule, Take 20 mg by mouth daily.  , Disp: , Rfl: ;  ONE TOUCH ULTRA TEST test strip, by Other route as needed. , Disp: , Rfl:  oxyCODONE-acetaminophen (PERCOCET/ROXICET) 5-325 MG per tablet, Take 1 tablet by mouth as needed for pain (Take 1- 2 tablets every 4 - 6 hours as needed for pain.)., Disp: , Rfl: ;  rosuvastatin (CRESTOR) 20 MG tablet, Take 20 mg by mouth daily.  , Disp: , Rfl: ;  testosterone cypionate (DEPOTESTOTERONE CYPIONATE) 200 MG/ML injection, Inject into the muscle every 21 (  twenty-one) days.  , Disp: , Rfl:  valsartan-hydrochlorothiazide (DIOVAN-HCT) 320-25 MG per tablet, Take 1 tablet by mouth daily. , Disp: , Rfl: ;  acyclovir (ZOVIRAX) 800 MG tablet, Take 800 mg by mouth 2 (two) times daily. , Disp: , Rfl: ;  nortriptyline (PAMELOR) 25 MG capsule, Take 25 mg by mouth. Pt not sure how he takes them, Disp: , Rfl:   Allergies: No Known Allergies  Past Medical History, Surgical history, Social history, and Family History were reviewed and updated.  Review of Systems: As above  Physical Exam:  height is 5' 5"  (1.651 m) and weight is 245 lb (111.131 kg). His oral temperature is 98.1 F (36.7 C). His blood pressure is 126/73 and his pulse is 82. His respiration is 18.   Obese white him in. In the right T3 dermatome, he has the red vesicular rash. The blisters appear to be drying up. His lungs are clear. Cardiac exam regular in rhythm. Abdomen is obese but soft. Has good bowel sounds. There is no fluid wave. There is no palpable hepatosplenomegaly. Back exam shows no tenderness over the spine ribs or hips. Extremities shows no clubbing cyanosis or edema. Skin exam shows a healed zoster rash in the right T3 dermatome. Neurological exam shows no focal neurological deficits.  Lab Results  Component Value Date  WBC 5.5 08/05/2014   HGB 13.1 08/05/2014   HCT 43.3 08/05/2014   MCV 68* 08/05/2014   PLT 264 08/05/2014     Chemistry      Component Value Date/Time   NA 137 08/12/2010 0755   K 4.2 08/12/2010 0755   CL 105 08/12/2010 0755   CO2 23 08/12/2010 0755   BUN 18 08/12/2010 0755   CREATININE 1.17 08/12/2010 0755      Component Value Date/Time   CALCIUM 9.5 08/12/2010 0755   ALKPHOS 76 08/12/2010 0755   AST 26 08/12/2010 0755   ALT 29 08/12/2010 0755   BILITOT 0.4 08/12/2010 0755         Impression and Plan: [name] is 68 year old woman with polycythemia. Thankfully, we do not have to phlebotomize him today.  I just wish that his post herpetic neuralgia would  get better.  I think we can get him back in 6 months time.  He knows that he can come back to see [place] if there are any problems.   [name], MD 8/19/20158:53 AM

## 2014-08-05 NOTE — Progress Notes (Signed)
Phlebotomy procedure not performed today due to HCT 43.3.

## 2014-11-10 ENCOUNTER — Encounter: Payer: Self-pay | Admitting: *Deleted

## 2014-11-11 ENCOUNTER — Encounter: Payer: Self-pay | Admitting: Neurology

## 2014-11-11 ENCOUNTER — Other Ambulatory Visit: Payer: Self-pay | Admitting: Neurology

## 2014-11-11 ENCOUNTER — Ambulatory Visit (INDEPENDENT_AMBULATORY_CARE_PROVIDER_SITE_OTHER): Payer: 59 | Admitting: Neurology

## 2014-11-11 VITALS — BP 145/98 | HR 96 | Resp 20 | Ht 66.0 in | Wt 245.0 lb

## 2014-11-11 DIAGNOSIS — G4730 Sleep apnea, unspecified: Secondary | ICD-10-CM

## 2014-11-11 DIAGNOSIS — R351 Nocturia: Secondary | ICD-10-CM

## 2014-11-11 DIAGNOSIS — G4710 Hypersomnia, unspecified: Secondary | ICD-10-CM | POA: Insufficient documentation

## 2014-11-11 HISTORY — DX: Nocturia: R35.1

## 2014-11-11 NOTE — Patient Instructions (Signed)
Polysomnography (Sleep Studies) Polysomnography (PSG) is a series of tests used for detecting (diagnosing) obstructive sleep apnea and other sleep disorders. The tests measure how some parts of your body are working while you are sleeping. The tests are extensive and expensive. They are done in a sleep lab or hospital, and vary from center to center. Your caregiver may perform other more simple sleep studies and questionnaires before doing more complete and involved testing. Testing may not be covered by insurance. Some of these tests are:  An EEG (Electroencephalogram). This tests your brain waves and stages of sleep.  An EOG (Electrooculogram). This measures the movements of your eyes. It detects periods of REM (rapid eye movement) sleep, which is your dream sleep.  An EKG (Electrocardiogram). This measures your heart rhythm.  EMG (Electromyography). This is a measurement of how the muscles are working in your upper airway and your legs while sleeping.  An oximetry measurement. It measures how much oxygen (air) you are getting while sleeping.  Breathing efforts may be measured. The same test can be interpreted (understood) differently by different caregivers and centers that study sleep.  Studies may be given an apnea/hypopnea index (AHI). This is a number which is found by counting the times of no breathing or under breathing during the night, and relating those numbers to the amount of time spent in bed. When the AHI is greater than 15, the patient is likely to complain of daytime sleepiness. When the AHI is greater than 30, the patient is at increased risk for heart problems and must be followed more closely. Following the AHI also allows you to know how treatment is working. Simple oximetry (tracking the amount of oxygen that is taken in) can be used for screening patients who:  Do not have symptoms (problems) of OSA.  Have a normal Epworth Sleepiness Scale Score.  Have a low pre-test  probability of having OSA.  Have none of the upper airway problems likely to cause apnea.  Oximetry is also used to determine if treatment is effective in patients who showed significant desaturations (not getting enough oxygen) on their home sleep study. One extra measure of safety is to perform additional studies for the person who only snores. This is because no one can predict with absolute certainty who will have OSA. Those who show significant desaturations (not getting enough oxygen) are recommended to have a more detailed sleep study. Document Released: 06/10/2003 Document Revised: 02/26/2012 Document Reviewed: 02/09/2014 [facility] Patient Information 2015 [place], [place]. This information is not intended to replace advice given to you by your health care provider. Make sure you discuss any questions you have with your health care provider.

## 2014-11-11 NOTE — Progress Notes (Signed)
SLEEP MEDICINE CLINIC   Provider:  Larey Seat, M D  Referring Provider: Sable Feil, MD Primary Care Physician:  [name], MD  Chief Complaint  Patient presents with  . NP Sharlett Iles  eval for osa    Rm 79, wife    HPI:  James [name]. is a 68 y.o. male ,seen here as a referral from Dr. Sharlett Iles  and upon recommendation of his Urologist for a sleep evaluation.  He was recently told by Dr. Sharlett Iles as well as by his urologist that his nocturia is not related to any prostate pathology that he had no trouble emptying his full bladder. Since he is also obese to Dr. Philip Aspen suggested to have a sleep apnea evaluation as a possible cause for the nocturia. The patient reports that he on some nights have restless legs. He often feels not restored or refreshed in the morning feeling like he didn't get enough sleep at night. He has some muscle cramps as well coughing and shortness of breath has been reported daytime fatigue and hearing loss. He has a history of hypertension, diabetes, obesity, and high cholesterol.   It is review of systems he also endorsed fatigue at 30 points the Epworth Sleepiness Scale at 13 points which indicates hypersomnia and the geriatric depression score at one point.   Dr. Sharlett Iles also listed that the patient has hypogonadism, and is stable on testosterone replacements, he was diagnosed with vitamin B12 deficiency pernicious anemia but the level is still considered borderline and he was given or prescribed sublingual vitamin B12 tablets, he has allergic rhinitis which may interfere with CPAP use as needed. His diabetes is controlled on metformin and glimepiride.   He is also on Diovan which contains a diuretic and takes it in the morning. He has peripheral vascular disease but no claudications and suffered in the past from shingles and has postherpetic neuralgia he has polycythemia vera he is followed by Dr. Lutricia Feil. Problem #12 on his list was  insomnia which really seems to be related to nocturia as cause for  inability to maintain  Sleep.   He goes to bed at 8 PM and is asleep by 8.05, he jokes. He falls asleep in church , in cinema and during TV or reading. He wakes at 4.30 and has been meanwhile up 4-5 times. His first nocturia is after 4 hours and then every 60- 90 minutes.   He doesn't nap  at work. 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   [name] reports that he has a son that also has sleep apnea and is actually depending happily on the use of his CPAP machine.  He lost one son to suicide. His mother and aunts had sleepiness.    Review of Systems: Out of a complete 14 system review, the patient complains of only the following symptoms, and all other reviewed systems are negative. Snoring some times.   It is review of systems he also endorsed fatigue at 30 points the Epworth Sleepiness Scale at 13 points which indicates hypersomnia and the geriatric depression score at one point.      History   Social History  . Marital Status: Married    Spouse Name: N/A    Number of Children: N/A  . Years of Education: N/A   Occupational History  . Not on file.   Social History Main Topics  . Smoking status: Never Smoker   . Smokeless tobacco:  Never Used     Comment: never used tobacco  . Alcohol Use: No     Comment: rarely  . Drug Use: No  . Sexual Activity: Not on file   Other Topics Concern  . Not on file   Social History Narrative   Right handed.  Caffeine 3 cups daily,  Married 2 kids (one deceased).  College grad.  [employer].     Family History  Problem Relation Age of Onset  . Colon cancer    . Heart failure    . Heart attack    . Lung disease Father     history  . Diabetes Mother     history  . Stroke Mother     history    Past Medical History  Diagnosis Date  . Polycythemia vera(238.4) 11/29/2011  . Obesity   . HTN  (hypertension)   . DM (diabetes mellitus)   . GERD (gastroesophageal reflux disease)   . Hiatal hernia   . Insomnia   . Rhinitis   . Hyperlipidemia   . Situational depression   . Nocturia more than twice per night 11/11/2014    For 6 month, 5 nocturias a night.     Past Surgical History  Procedure Laterality Date  . Cardiac catheterization    . Total knee arthroplasty  2010  . Rotator cuff repair      right  . Carpal tunnel release      bilateral  . Tonsillectomy    . Tendon repair      left arm    Current Outpatient Prescriptions  Medication Sig Dispense Refill  . aspirin EC 81 MG tablet Take 81 mg by mouth daily.    . [facility] B-12 500 MCG SUBL   12  . gabapentin (NEURONTIN) 300 MG capsule Take 300 mg by mouth 2 (two) times daily.     [name] glimepiride (AMARYL) 2 MG tablet Take 2 mg by mouth. Pt not sure how he takes them    . HYDROcodone-homatropine (HYCODAN) 5-1.5 MG/5ML syrup   0  . LYRICA 75 MG capsule Take 75 mg by mouth 2 (two) times daily. Taking one at bedtime  3  . metFORMIN (GLUMETZA) 1000 MG (MOD) 24 hr tablet Take 1,000 mg by mouth 2 (two) times daily with a meal.     . nortriptyline (PAMELOR) 25 MG capsule Take 25 mg by mouth. Pt not sure how he takes them    . omeprazole (PRILOSEC) 20 MG capsule Take 20 mg by mouth daily.      . ONE TOUCH ULTRA TEST test strip by Other route as needed.     . rosuvastatin (CRESTOR) 20 MG tablet Take 20 mg by mouth daily.      . tamsulosin (FLOMAX) 0.4 MG CAPS capsule Take 0.4 mg by mouth daily.    [name] testosterone cypionate (DEPOTESTOTERONE CYPIONATE) 200 MG/ML injection Inject into the muscle every 21 ( twenty-one) days.      . valsartan-hydrochlorothiazide (DIOVAN-HCT) 320-25 MG per tablet Take 1 tablet by mouth daily.     [name] VIAGRA 100 MG tablet   1  . desmopressin (DDAVP) 0.2 MG tablet Take 0.2 mg by mouth at bedtime.      No current facility-administered medications for this visit.    Allergies as of 11/11/2014  . (No Known  Allergies)    Vitals: BP 145/98 mmHg  Pulse 96  Resp 20  Ht 5' 6"  (1.676 m)  Wt 245 lb (111.131 kg)  BMI 39.56 kg/m2 Last Weight:  Wt Readings from Last 1 Encounters:  11/11/14 245 lb (111.131 kg)       Last Height:   Ht Readings from Last 1 Encounters:  11/11/14 5' 6"  (1.676 m)    Physical exam:  General: The patient is awake, alert and appears not in acute distress. The patient is well groomed. Head: Normocephalic, atraumatic. Neck is supple. Mallampati 2 neck circumference: 17.5 . Nasal airflow restricted , TMJ is  not evident . Retrognathia is  Not een.  Cardiovascular:  Regular rate and rhythm , without  murmurs or carotid bruit, and without distended neck veins. Respiratory: Lungs are clear to auscultation. Skin:  Without evidence of edema, or rash Trunk: BMI is elevated and patient  has normal posture.  Neurologic exam : The patient is awake and alert, oriented to place and time.   Memory subjective described as intact. There is a normal attention span & concentration ability. Speech is fluent without dysarthria, dysphonia or aphasia. Mood and affect are appropriate.  Cranial nerves: Pupils are equal and briskly reactive to light. Funduscopic exam without evidence of pallor or edema. Extraocular movements  in vertical and horizontal planes intact and without nystagmus. Visual fields by finger perimetry are intact. Hearing to finger rub intact.  Facial sensation intact to fine touch. Facial motor strength is symmetric and tongue and uvula move midline.  Motor exam: Normal tone, muscle bulk and symmetric strength in all extremities.  Sensory:  Fine touch, pinprick and vibration were tested in all extremities. Proprioception is normal.  Coordination: Rapid alternating movements in the fingers/hands is normal. Finger-to-nose maneuver  normal without evidence of ataxia, dysmetria or tremor.  Gait and station: Patient walks without assistive device and is able unassisted to  climb up to the exam table. Strength within normal limits. Stance is stable and normal.  Deep tendon reflexes: in the  upper and lower extremities are symmetric and intact. Babinski maneuver response is  downgoing.   Assessment:  After physical and neurologic examination, review of laboratory studies, imaging, neurophysiology testing and pre-existing records, assessment is  1)high risk for OSA based on BMI, neck and built. He has a stocky built, barrel chested.    The patient was advised of the nature of the diagnosed sleep disorder , the treatment options and risks for general a health and wellness arising from not treating the condition. Visit duration was  45  minutes.   Plan:  Treatment plan and additional workup :  1) SPLIT study , AHI 15 , Co2 , score at 3 %-  [employer]. Deductable starts again,  January 1 st.      Asencion Partridge Tezra Mahr MD  11/11/2014

## 2014-11-26 ENCOUNTER — Ambulatory Visit (INDEPENDENT_AMBULATORY_CARE_PROVIDER_SITE_OTHER): Payer: 59 | Admitting: Neurology

## 2014-11-26 VITALS — BP 149/79 | HR 95 | Ht 66.0 in | Wt 245.0 lb

## 2014-11-26 DIAGNOSIS — G4719 Other hypersomnia: Secondary | ICD-10-CM

## 2014-11-26 DIAGNOSIS — G4710 Hypersomnia, unspecified: Secondary | ICD-10-CM

## 2014-11-27 NOTE — Sleep Study (Signed)
Please see the scanned sleep study interpretation located in the Procedure tab within the Chart Review section. 

## 2014-12-03 ENCOUNTER — Other Ambulatory Visit: Payer: Self-pay | Admitting: Neurology

## 2014-12-03 DIAGNOSIS — G4733 Obstructive sleep apnea (adult) (pediatric): Secondary | ICD-10-CM

## 2014-12-04 ENCOUNTER — Encounter: Payer: Self-pay | Admitting: Neurology

## 2014-12-04 ENCOUNTER — Telehealth: Payer: Self-pay | Admitting: Neurology

## 2014-12-17 NOTE — Telephone Encounter (Signed)
Patient was contacted and provided the results from his recent sleep study.  He is aware of the finding of obstructive sleep apnea resulting in a recommendation by Dr. Brett Fairy that the patient begin CPAP therapy.  He is aware that an order has been submitted to a DME company for processing.  The patient's referring provider has been forwarded a copy of his test result and the patient has elected to receive his sleep study result via mail.

## 2015-01-29 ENCOUNTER — Encounter: Payer: Self-pay | Admitting: Neurology

## 2015-01-29 ENCOUNTER — Ambulatory Visit (INDEPENDENT_AMBULATORY_CARE_PROVIDER_SITE_OTHER): Payer: 59 | Admitting: Neurology

## 2015-01-29 VITALS — BP 137/87 | HR 92 | Resp 18 | Wt 247.0 lb

## 2015-01-29 DIAGNOSIS — R351 Nocturia: Secondary | ICD-10-CM

## 2015-01-29 DIAGNOSIS — Z9989 Dependence on other enabling machines and devices: Principal | ICD-10-CM

## 2015-01-29 DIAGNOSIS — G4733 Obstructive sleep apnea (adult) (pediatric): Secondary | ICD-10-CM

## 2015-01-29 NOTE — Patient Instructions (Signed)

## 2015-01-29 NOTE — Progress Notes (Signed)
SLEEP MEDICINE CLINIC   Provider:  Larey Seat, M D  Referring Provider: Leanna Battles, MD Primary Care Physician:  [name], MD  Chief Complaint  Patient presents with  . RV cpap    Rm 11, alone    HPI:  James [name]. is a 69 y.o. male ,seen here as a referral from Dr. Philip Aspen  and upon recommendation of his Urologist for a sleep evaluation.  He was recently told by Dr. Sharlett Iles as well as by his urologist that his nocturia is not related to any prostate pathology that he had no trouble emptying his full bladder. Since he is also obese to Dr. Philip Aspen suggested to have a sleep apnea evaluation as a possible cause for the nocturia. The patient reports that he on some nights have restless legs. He often feels not restored or refreshed in the morning feeling like he didn't get enough sleep at night. He has some muscle cramps as well coughing and shortness of breath has been reported daytime fatigue and hearing loss. He has a history of hypertension, diabetes, obesity, and high cholesterol.   It is review of systems he also endorsed fatigue at 30 points the Epworth Sleepiness Scale at 13 points which indicates hypersomnia and the geriatric depression score at one point.   Dr. Sharlett Iles also listed that the patient has hypogonadism, and is stable on testosterone replacements, he was diagnosed with vitamin B12 deficiency pernicious anemia but the level is still considered borderline and he was given or prescribed sublingual vitamin B12 tablets, he has allergic rhinitis which may interfere with CPAP use as needed. His diabetes is controlled on metformin and glimepiride.  He is also on Diovan which contains a diuretic and takes it in the morning. He has peripheral vascular disease but no claudications and suffered in the past from shingles and has postherpetic neuralgia he has polycythemia vera . Problem #1 on his list was insomnia which really seems to be related to nocturia as  cause for  inability to maintain  Sleep.  He goes to bed at 8 PM and is asleep by 8.05, he jokes. He falls asleep in church , in cinema and during TV or reading. He wakes at 4.30 and has been meanwhile up 4-5 times. His first nocturia is after 4 hours and then every 60- 90 minutes.   He doesn't nap  at work. 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  James [name] reports that he has a son that also has sleep apnea and is actually depending happily on the use of his CPAP machine.  He lost one son to suicide. His mother and aunts had sleepiness.   01-29-15 James [name] underwent a split night polysomnography on 11-26-14 after he had endorsed the Epworth sleepiness score at an elevated level of 13 points. He has a past medical history including polycythemia vera obesity dyspnea and hypertension and diabetes. In addition he had nocturia more than 3 times at night. He was diagnosed with an AHI of 24.8 and an RDI of 26.5 he slept not on his back but only on his side during the night in the sleep lab he also had very frequent periodic limb movements his PLM arousals were 18.5 per hour. He was titrated to 7 cm CPAP with a reduction of the AHI to 5.4 the now residual apneas were mostly central in nature. His PLM still continued as he was on CPAP. I see him today  for his first visit after the sleep study he endorsed today the Epworth sleepiness score at 6 points which is quite reduced in comparison and his fatigue severity at 19 points. He has been using an O2 sat CPAP machine with a minimum pressure of 5 cm water and a maximum pressure of 12 cm water the 95 st percentile of pressure is 11.2 He is 100% compliance for 30 days and 97% compliance for over 4 hours of use, average time of use of 6 hours 59 minutes EPR level 3 cm water.   I would like for this patient to remain on this AutoSet machine his residual AHI is only 1.2 there are no central apneas. He does have a mild air leak. He  uses a nasal covering mask.  He has some pressure marks around the nose and I suggested to try and nasal pillow. He had not seen one before also the sleep lab staff is supposed to offer several options to the patient. He does like airfit  P 10. I would like to add that the patient still has 3 nocturia is at night in spite of having sufficiently even wonderfully treated     Review of Systems: Out of a complete 14 system review, the patient complains of only the following symptoms, and all other reviewed systems are negative. Snoring some times.   It is review of systems he also endorsed fatigue at 19 fromm  30 points the Epworth Sleepiness Scale at  6 from 13 points  and the geriatric depression score at one point.  I would like for this patient to remain on this AutoSet machine his residual AHI is only 1.2 there are no central apneas. He does have a mild air leak. He uses a nasal covering mask.  He has some pressure marks around the nose and I suggested to try and nasal pillow. He had not seen one before also the sleep lab staff is supposed to offer several options to the patient. He does like airfit  P 10. I would like to add that the patient still has 3 nocturia is at night in spite of having sufficiently even wonderfully treated       History   Social History  . Marital Status: Married    Spouse Name: N/A  . Number of Children: N/A  . Years of Education: N/A   Occupational History  . Not on file.   Social History Main Topics  . Smoking status: Never Smoker   . Smokeless tobacco: Never Used     Comment: never used tobacco  . Alcohol Use: No     Comment: rarely  . Drug Use: No  . Sexual Activity: Not on file   Other Topics Concern  . Not on file   Social History Narrative   Right handed.  Caffeine 3 cups daily,  Married 2 kids (one deceased).  College grad.  [employer].     Family History  Problem Relation Age of Onset  . Colon cancer    .  Heart failure    . Heart attack    . Lung disease Father     history  . Diabetes Mother     history  . Stroke Mother     history    Past Medical History  Diagnosis Date  . Polycythemia vera(238.4) 11/29/2011  . Obesity   . HTN (hypertension)   . DM (diabetes mellitus)   . GERD (gastroesophageal reflux disease)   .  Hiatal hernia   . Insomnia   . Rhinitis   . Hyperlipidemia   . Situational depression   . Nocturia more than twice per night 11/11/2014    For 6 month, 5 nocturias a night.     Past Surgical History  Procedure Laterality Date  . Cardiac catheterization    . Total knee arthroplasty  2010  . Rotator cuff repair      right  . Carpal tunnel release      bilateral  . Tonsillectomy    . Tendon repair      left arm    Current Outpatient Prescriptions  Medication Sig Dispense Refill  . aspirin EC 81 MG tablet Take 81 mg by mouth daily.    . CVS B-12 500 MCG SUBL   12  . desmopressin (DDAVP) 0.2 MG tablet Take 0.2 mg by mouth at bedtime.     . Eszopiclone 3 MG TABS Take 3 mg by mouth at bedtime as needed.  5  . gabapentin (NEURONTIN) 300 MG capsule Take 300 mg by mouth 2 (two) times daily.     [name] glimepiride (AMARYL) 2 MG tablet Take 2 mg by mouth. Pt not sure how he takes them    . HYDROcodone-homatropine (HYCODAN) 5-1.5 MG/5ML syrup   0  . LYRICA 75 MG capsule Take 75 mg by mouth 2 (two) times daily. Taking one at bedtime  3  . metFORMIN (GLUMETZA) 1000 MG (MOD) 24 hr tablet Take 1,000 mg by mouth 2 (two) times daily with a meal.     . nortriptyline (PAMELOR) 25 MG capsule Take 25 mg by mouth. Pt not sure how he takes them    . omeprazole (PRILOSEC) 20 MG capsule Take 20 mg by mouth daily.      . ONE TOUCH ULTRA TEST test strip by Other route as needed.     . rosuvastatin (CRESTOR) 20 MG tablet Take 20 mg by mouth daily.      . tamsulosin (FLOMAX) 0.4 MG CAPS capsule Take 0.4 mg by mouth daily.    [name] testosterone cypionate (DEPOTESTOTERONE CYPIONATE) 200 MG/ML  injection Inject into the muscle every 21 ( twenty-one) days.      . valsartan-hydrochlorothiazide (DIOVAN-HCT) 320-25 MG per tablet Take 1 tablet by mouth daily.     [name] VIAGRA 100 MG tablet   1   No current facility-administered medications for this visit.    Allergies as of 01/29/2015  . (No Known Allergies)    Vitals: BP 137/87 mmHg  Pulse 92  Resp 18  Wt 247 lb (112.038 kg) Last Weight:  Wt Readings from Last 1 Encounters:  01/29/15 247 lb (112.038 kg)       Last Height:   Ht Readings from Last 1 Encounters:  11/27/14 5' 6"  (1.676 m)    Physical exam:  General: The patient is awake, alert and appears not in acute distress. The patient is well groomed. Head: Normocephalic, atraumatic. Neck is supple. Mallampati 2 neck circumference: 17.5 . Nasal airflow restricted , right nasion over left nostril.  , TMJ is  not evident . Retrognathia is  Not een.  Cardiovascular:  Regular rate and rhythm , without  murmurs or carotid bruit, and without distended neck veins. Respiratory: Lungs are clear to auscultation. Skin:  Without evidence of edema, or rash Trunk: BMI is elevated and patient  has normal posture.  Neurologic exam : The patient is awake and alert, oriented to place and time.   Memory subjective described  as intact. There is a normal attention span & concentration ability. Speech is fluent without dysarthria, dysphonia or aphasia. Mood and affect are appropriate.  Cranial nerves: Pupils are equal and briskly reactive to light. Visual fields by finger perimetry are intact. Hearing to finger rub intact with hearing aids in place. .  Facial sensation intact to fine touch. Facial motor strength is symmetric and tongue and uvula move midline. The mucosa membranes are dark/   Motor exam: Normal tone, muscle bulk and symmetric strength in all extremities.  Sensory:  Fine touch, pinprick and vibration were tested in all extremities. Proprioception is normal.  Coordination:  Rapid alternating movements in the fingers/hands is normal. Finger-to-nose maneuver  normal without evidence of ataxia, dysmetria or tremor.  Gait and station: Patient walks without assistive device and is able unassisted to climb up to the exam table. Strength within normal limits. Stance is stable and normal.  Deep tendon reflexes: in the  upper and lower extremities are symmetric and intact.  Assessment:  After physical and neurologic examination, review of laboratory studies, imaging, neurophysiology testing and pre-existing records, assessment is  1)hconfirmed  OSA based on  SPLIT night polysomnoagraphy with 27 AHI. Ridk factors are the patient's BMI, neck , rhintis and built. He has a stocky built, barrel chested.    The patient was advised of the nature of the diagnosed sleep disorder , the treatment options and risks for general a health and wellness arising from not treating the condition. Visit duration was  20  minutes.   Plan:  Treatment plan and additional workup :  1) I would like for James [name] to change to a nasal pillow mask of his choice should be able with his DME to fit him to either an air-fluid P 10 or to a dream ware mask,  he does not have shoulder joint restrictions in movement - DME is  Harriette Bouillon Kalani Sthilaire MD  01/29/2015

## 2015-02-03 ENCOUNTER — Ambulatory Visit (HOSPITAL_BASED_OUTPATIENT_CLINIC_OR_DEPARTMENT_OTHER): Payer: PRIVATE HEALTH INSURANCE | Admitting: Hematology & Oncology

## 2015-02-03 ENCOUNTER — Encounter: Payer: Self-pay | Admitting: Hematology & Oncology

## 2015-02-03 ENCOUNTER — Other Ambulatory Visit (HOSPITAL_BASED_OUTPATIENT_CLINIC_OR_DEPARTMENT_OTHER): Payer: PRIVATE HEALTH INSURANCE | Admitting: Lab

## 2015-02-03 VITALS — BP 144/78 | HR 84 | Temp 98.5°F | Resp 20 | Ht 66.0 in | Wt 247.0 lb

## 2015-02-03 DIAGNOSIS — D45 Polycythemia vera: Secondary | ICD-10-CM

## 2015-02-03 DIAGNOSIS — B0229 Other postherpetic nervous system involvement: Secondary | ICD-10-CM

## 2015-02-03 DIAGNOSIS — D509 Iron deficiency anemia, unspecified: Secondary | ICD-10-CM

## 2015-02-03 LAB — IRON AND TIBC CHCC
%SAT: 7 % — ABNORMAL LOW (ref 20–55)
Iron: 31 ug/dL — ABNORMAL LOW (ref 42–163)
TIBC: 443 ug/dL — ABNORMAL HIGH (ref 202–409)
UIBC: 412 ug/dL — ABNORMAL HIGH (ref 117–376)

## 2015-02-03 LAB — CBC WITH DIFFERENTIAL (CANCER CENTER ONLY)
BASO#: 0 10*3/uL (ref 0.0–0.2)
BASO%: 0.7 % (ref 0.0–2.0)
EOS%: 2.8 % (ref 0.0–7.0)
Eosinophils Absolute: 0.2 10*3/uL (ref 0.0–0.5)
HCT: 45.9 % (ref 38.7–49.9)
HGB: 13.7 g/dL (ref 13.0–17.1)
LYMPH#: 0.9 10*3/uL (ref 0.9–3.3)
LYMPH%: 14.9 % (ref 14.0–48.0)
MCH: 19.9 pg — ABNORMAL LOW (ref 28.0–33.4)
MCHC: 29.8 g/dL — ABNORMAL LOW (ref 32.0–35.9)
MCV: 67 fL — AB (ref 82–98)
MONO#: 0.7 10*3/uL (ref 0.1–0.9)
MONO%: 12.2 % (ref 0.0–13.0)
NEUT#: 4.2 10*3/uL (ref 1.5–6.5)
NEUT%: 69.4 % (ref 40.0–80.0)
PLATELETS: 268 10*3/uL (ref 145–400)
RBC: 6.87 10*6/uL — AB (ref 4.20–5.70)
RDW: 22.5 % — AB (ref 11.1–15.7)
WBC: 6.1 10*3/uL (ref 4.0–10.0)

## 2015-02-03 LAB — FERRITIN CHCC: Ferritin: 9 ng/ml — ABNORMAL LOW (ref 22–316)

## 2015-02-03 NOTE — Progress Notes (Signed)
Hematology and Oncology Follow Up Visit  James [name] [id number] December 15, 1946 69 y.o. 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  Aspirin 81 mg by mouth daily     Interim History:  James [name] is back for followup. 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 He had this in the right T3 dermatome. This probably was about 6 months ago. He still is bothered but with neuralgia. He is on Lyrica for this. He says it helps a little bit.  Otherwise, he's been doing okay. His blood sugars have been a little on the high side. He still is not exercising as much as he should. His weight is about the same.  We've not had a phlebotomize him now for about 1 year . He has iron deficient which is helping.  He's had no headache. He's had no change in bowel or bladder habits. He's had no rashes, outside of that incision with the post herpetic neuralgia.  Medications:  Current outpatient prescriptions:  .  aspirin EC 81 MG tablet, Take 81 mg by mouth daily., Disp: , Rfl:  .  [facility] B-12 500 MCG SUBL, , Disp: , Rfl: 12 .  desmopressin (DDAVP) 0.2 MG tablet, Take 0.2 mg by mouth at bedtime. , Disp: , Rfl:  .  Eszopiclone 3 MG TABS, Take 3 mg by mouth at bedtime as needed., Disp: , Rfl: 5 .  glimepiride (AMARYL) 2 MG tablet, Take 2 mg by mouth. Pt not sure how he takes them, Disp: , Rfl:  .  HYDROcodone-homatropine (HYCODAN) 5-1.5 MG/5ML syrup, , Disp: , Rfl: 0 .  LYRICA 75 MG capsule, Take 75 mg by mouth 2 (two) times daily. Taking one at bedtime, Disp: , Rfl: 3 .  metFORMIN (GLUMETZA) 1000 MG (MOD) 24 hr tablet, Take 1,000 mg by mouth 2 (two) times daily with a meal. , Disp: , Rfl:  .  omeprazole (PRILOSEC) 20 MG capsule, Take 20 mg by mouth daily.  , Disp: , Rfl:  .  ONE TOUCH ULTRA TEST test strip, by Other route as needed. , Disp: , Rfl:  .   rosuvastatin (CRESTOR) 20 MG tablet, Take 20 mg by mouth daily.  , Disp: , Rfl:  .  testosterone cypionate (DEPOTESTOTERONE CYPIONATE) 200 MG/ML injection, Inject into the muscle every 21 ( twenty-one) days.  , Disp: , Rfl:  .  valsartan-hydrochlorothiazide (DIOVAN-HCT) 320-25 MG per tablet, Take 1 tablet by mouth daily. , Disp: , Rfl:  .  VIAGRA 100 MG tablet, , Disp: , Rfl: 1 .  gabapentin (NEURONTIN) 300 MG capsule, Take 300 mg by mouth 2 (two) times daily. , Disp: , Rfl:  .  nortriptyline (PAMELOR) 25 MG capsule, Take 25 mg by mouth. Pt not sure how he takes them, Disp: , Rfl:  .  tamsulosin (FLOMAX) 0.4 MG CAPS capsule, Take 0.4 mg by mouth daily., Disp: , Rfl:   Allergies: No Known Allergies  Past Medical History, Surgical history, Social history, and Family History were reviewed and updated.  Review of Systems: As above  Physical Exam:  height is 5' 6"  (1.676 m) and weight is 247 lb (112.038 kg). His oral temperature is 98.5 F (36.9 C). His blood pressure is 144/78 and his pulse is 84. His respiration is 20.   Obese white him in. In the right T3 dermatome, he has  the red vesicular rash. The blisters appear to be drying up. His lungs are clear. Cardiac exam regular rate and rhythm with no murmurs, rubs or bruits.. Abdomen is obese but soft. He has good bowel sounds. There is no fluid wave. There is no palpable hepatosplenomegaly. Back exam shows no tenderness over the spine ribs or hips. Extremities shows no clubbing cyanosis or edema. Skin exam shows a healed zoster rash in the right T3 dermatome. Neurological exam shows no focal neurological deficits.  Lab Results  Component Value Date   WBC 6.1 02/03/2015   HGB 13.7 02/03/2015   HCT 45.9 02/03/2015   MCV 67* 02/03/2015   PLT 268 02/03/2015     Chemistry      Component Value Date/Time   NA 137 08/12/2010 0755   K 4.2 08/12/2010 0755   CL 105 08/12/2010 0755   CO2 23 08/12/2010 0755   BUN 18 08/12/2010 0755   CREATININE  1.17 08/12/2010 0755      Component Value Date/Time   CALCIUM 9.5 08/12/2010 0755   ALKPHOS 76 08/12/2010 0755   AST 26 08/12/2010 0755   ALT 29 08/12/2010 0755   BILITOT 0.4 08/12/2010 0755         Impression and Plan: James [name] is 69 year old male with polycythemia. We will go ahead and phlebotomize him. He has not been [place] for probably over a year now.  He will have come back to give phlebotomize. He has good work.  I will plan to get back in another 4 months. By then, he will be 69 years old.  He knows that he can come back to see [place] if there are any problems.  Hopefully, the postherpetic neuralgia will continue to improve. This is a bother for him. He is on Lyrica.   [name], MD 2/17/20168:53 AM

## 2015-02-10 ENCOUNTER — Ambulatory Visit (HOSPITAL_BASED_OUTPATIENT_CLINIC_OR_DEPARTMENT_OTHER): Payer: PRIVATE HEALTH INSURANCE

## 2015-02-10 VITALS — BP 129/83 | HR 95 | Temp 98.1°F | Resp 20

## 2015-02-10 DIAGNOSIS — D45 Polycythemia vera: Secondary | ICD-10-CM

## 2015-02-10 NOTE — Patient Instructions (Signed)

## 2015-02-10 NOTE — Progress Notes (Signed)
James [name]. presents today for phlebotomy per MD orders. Phlebotomy procedure started at 1255 and ended at 1205. 500 ml removed. Patient observed for 30 minutes after procedure without any incident. Patient tolerated procedure well. IV needle removed intact.

## 2015-06-02 ENCOUNTER — Other Ambulatory Visit (HOSPITAL_BASED_OUTPATIENT_CLINIC_OR_DEPARTMENT_OTHER): Payer: PRIVATE HEALTH INSURANCE

## 2015-06-02 ENCOUNTER — Encounter: Payer: Self-pay | Admitting: Hematology & Oncology

## 2015-06-02 ENCOUNTER — Ambulatory Visit (HOSPITAL_BASED_OUTPATIENT_CLINIC_OR_DEPARTMENT_OTHER): Payer: PRIVATE HEALTH INSURANCE | Admitting: Hematology & Oncology

## 2015-06-02 VITALS — BP 127/76 | HR 81 | Temp 98.7°F | Resp 18 | Ht 65.0 in | Wt 239.0 lb

## 2015-06-02 DIAGNOSIS — H34811 Central retinal vein occlusion, right eye: Secondary | ICD-10-CM | POA: Diagnosis not present

## 2015-06-02 DIAGNOSIS — D45 Polycythemia vera: Secondary | ICD-10-CM | POA: Diagnosis not present

## 2015-06-02 DIAGNOSIS — B029 Zoster without complications: Secondary | ICD-10-CM | POA: Diagnosis not present

## 2015-06-02 LAB — CBC WITH DIFFERENTIAL (CANCER CENTER ONLY)
BASO#: 0 10*3/uL (ref 0.0–0.2)
BASO%: 0.6 % (ref 0.0–2.0)
EOS ABS: 0.2 10*3/uL (ref 0.0–0.5)
EOS%: 2.5 % (ref 0.0–7.0)
HCT: 41.8 % (ref 38.7–49.9)
HEMOGLOBIN: 13.1 g/dL (ref 13.0–17.1)
LYMPH#: 1.4 10*3/uL (ref 0.9–3.3)
LYMPH%: 20.6 % (ref 14.0–48.0)
MCH: 20.9 pg — ABNORMAL LOW (ref 28.0–33.4)
MCHC: 31.3 g/dL — ABNORMAL LOW (ref 32.0–35.9)
MCV: 67 fL — ABNORMAL LOW (ref 82–98)
MONO#: 0.8 10*3/uL (ref 0.1–0.9)
MONO%: 11.3 % (ref 0.0–13.0)
NEUT%: 65 % (ref 40.0–80.0)
NEUTROS ABS: 4.4 10*3/uL (ref 1.5–6.5)
Platelets: 283 10*3/uL (ref 145–400)
RBC: 6.27 10*6/uL — AB (ref 4.20–5.70)
RDW: 23.5 % — ABNORMAL HIGH (ref 11.1–15.7)
WBC: 6.8 10*3/uL (ref 4.0–10.0)

## 2015-06-02 LAB — COMPREHENSIVE METABOLIC PANEL
ALBUMIN: 4.3 g/dL (ref 3.5–5.2)
ALT: 20 U/L (ref 0–53)
AST: 19 U/L (ref 0–37)
Alkaline Phosphatase: 79 U/L (ref 39–117)
BUN: 19 mg/dL (ref 6–23)
CALCIUM: 10.7 mg/dL — AB (ref 8.4–10.5)
CO2: 22 meq/L (ref 19–32)
CREATININE: 1.31 mg/dL (ref 0.50–1.35)
Chloride: 106 mEq/L (ref 96–112)
Glucose, Bld: 94 mg/dL (ref 70–99)
POTASSIUM: 4.6 meq/L (ref 3.5–5.3)
Sodium: 137 mEq/L (ref 135–145)
Total Bilirubin: 0.4 mg/dL (ref 0.2–1.2)
Total Protein: 6.8 g/dL (ref 6.0–8.3)

## 2015-06-02 NOTE — Progress Notes (Signed)
Hematology and Oncology Follow Up Visit  [place] Fant [id number] 1945-12-31 69 y.o. 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  Aspirin 81 mg by mouth daily  Eleya injections in the eye     Interim History:  James [name] is back for followup. Surprisingly enough, he developed a retinal vein occlusion in the right eye. This is in March. He says he woke up one morning and can only see shadows with the right eye. He subsequently was started on intraocular injections with Eleya. This is an anti-VEGF agent. He says his vision is much better.  I don't think this is related to the polycythemia. He's had very good control of his blood counts. He's been on the baby aspirin.  He still has some of the postherpetic neuralgia issues with the right T3 dermatome. This does seem to begin better.  He's had no issues with bleeding. He's had no change in bowel or bladder habits. He's had no nausea or vomiting. He's had no cough. The every still is quite overweight. He is not exercising as he would like. Overall, his performance status is ECOG 1..  Medications:  Current outpatient prescriptions:  .  aspirin EC 81 MG tablet, Take 81 mg by mouth daily., Disp: , Rfl:  .  [facility]S B-12 500 MCG SUBL, , Disp: , Rfl: 12 .  desmopressin (DDAVP) 0.2 MG tablet, Take 0.2 mg by mouth at bedtime. , Disp: , Rfl:  .  Eszopiclone 3 MG TABS, Take 3 mg by mouth at bedtime as needed., Disp: , Rfl: 5 .  glimepiride (AMARYL) 2 MG tablet, Take 2 mg by mouth. Pt not sure how he takes them, Disp: , Rfl:  .  HYDROcodone-homatropine (HYCODAN) 5-1.5 MG/5ML syrup, , Disp: , Rfl: 0 .  metFORMIN (GLUMETZA) 1000 MG (MOD) 24 hr tablet, Take 1,000 mg by mouth 2 (two) times daily with a meal. , Disp: , Rfl:  .  omeprazole (PRILOSEC) 20 MG capsule, Take 20 mg by  mouth daily.  , Disp: , Rfl:  .  ONE TOUCH ULTRA TEST test strip, by Other route as needed. , Disp: , Rfl:  .  rosuvastatin (CRESTOR) 20 MG tablet, Take 20 mg by mouth daily.  , Disp: , Rfl:  .  testosterone cypionate (DEPOTESTOTERONE CYPIONATE) 200 MG/ML injection, Inject into the muscle every 21 ( twenty-one) days.  , Disp: , Rfl:  .  tobramycin (TOBREX) 0.3 % ophthalmic solution, 1 DROP IN RIGHT EYE FOUR TIMES A DAY BEGIN ONE DAY PRIOR TO SURGERY AND CONTINUE AS DIRECTED., Disp: , Rfl: 5 .  valsartan-hydrochlorothiazide (DIOVAN-HCT) 320-25 MG per tablet, Take 1 tablet by mouth daily. , Disp: , Rfl:  .  VIAGRA 100 MG tablet, , Disp: , Rfl: 1 .  gabapentin (NEURONTIN) 300 MG capsule, Take 300 mg by mouth 2 (two) times daily. , Disp: , Rfl:  .  nortriptyline (PAMELOR) 25 MG capsule, Take 25 mg by mouth. Pt not sure how he takes them, Disp: , Rfl:   Allergies: No Known Allergies  Past Medical History, Surgical history, Social history, and Family History were reviewed and updated.  Review of Systems: As above  Physical Exam:  height is 5' 5"  (1.651 m) and weight is 239 lb (108.41 kg). His oral temperature is 98.7 F (37.1 C). His blood  pressure is 127/76 and his pulse is 81. His respiration is 18.   Obese white him in. In the right T3 dermatome, he has the red vesicular rash. The blisters appear to be drying up. His lungs are clear. Cardiac exam regular rate and rhythm with no murmurs, rubs or bruits.. Abdomen is obese but soft. He has good bowel sounds. There is no fluid wave. There is no palpable hepatosplenomegaly. Back exam shows no tenderness over the spine ribs or hips. Extremities shows no clubbing cyanosis or edema. Skin exam shows a healed zoster rash in the right T3 dermatome. Neurological exam shows no focal neurological deficits.  Lab Results  Component Value Date   WBC 6.8 06/02/2015   HGB 13.1 06/02/2015   HCT 41.8 06/02/2015   MCV 67* 06/02/2015   PLT 283 06/02/2015      Chemistry      Component Value Date/Time   NA 137 08/12/2010 0755   K 4.2 08/12/2010 0755   CL 105 08/12/2010 0755   CO2 23 08/12/2010 0755   BUN 18 08/12/2010 0755   CREATININE 1.17 08/12/2010 0755      Component Value Date/Time   CALCIUM 9.5 08/12/2010 0755   ALKPHOS 76 08/12/2010 0755   AST 26 08/12/2010 0755   ALT 29 08/12/2010 0755   BILITOT 0.4 08/12/2010 0755         Impression and Plan: James [name] is 19- male with polycythemia. He was last phlebotomized back in February. This helped him.  Again, the retinal vein occlusion I think is a separate issue. I don't think he he take at a hypercoagulable workup.  I think we can get him back in 6 months now. I think this would be reasonable. He is quite iron deficient so I don't think that he will increase his hemoglobin that much.   [name], MD 6/15/20163:00 PM

## 2015-06-03 LAB — IRON AND TIBC CHCC
%SAT: 7 % — AB (ref 20–55)
Iron: 29 ug/dL — ABNORMAL LOW (ref 42–163)
TIBC: 441 ug/dL — AB (ref 202–409)
UIBC: 412 ug/dL — AB (ref 117–376)

## 2015-06-03 LAB — FERRITIN CHCC: Ferritin: 13 ng/ml — ABNORMAL LOW (ref 22–316)

## 2015-08-06 ENCOUNTER — Ambulatory Visit: Payer: 59 | Admitting: Neurology

## 2015-08-12 ENCOUNTER — Ambulatory Visit: Payer: 59 | Admitting: Neurology

## 2015-09-06 ENCOUNTER — Ambulatory Visit: Payer: 59 | Admitting: Neurology

## 2015-09-28 ENCOUNTER — Encounter: Payer: Self-pay | Admitting: Neurology

## 2015-09-28 ENCOUNTER — Ambulatory Visit (INDEPENDENT_AMBULATORY_CARE_PROVIDER_SITE_OTHER): Payer: 59 | Admitting: Neurology

## 2015-09-28 VITALS — BP 132/82 | HR 84 | Resp 20 | Ht 74.0 in | Wt 248.0 lb

## 2015-09-28 DIAGNOSIS — Z9989 Dependence on other enabling machines and devices: Principal | ICD-10-CM

## 2015-09-28 DIAGNOSIS — M4120 Other idiopathic scoliosis, site unspecified: Secondary | ICD-10-CM

## 2015-09-28 DIAGNOSIS — G4733 Obstructive sleep apnea (adult) (pediatric): Secondary | ICD-10-CM | POA: Diagnosis not present

## 2015-09-28 NOTE — Progress Notes (Signed)
SLEEP MEDICINE CLINIC   Provider:  Larey Seat, M D  Referring Provider: Leanna Battles, MD Primary Care Physician:  [name], MD  Chief Complaint  Patient presents with  . Follow-up    cpap, rm 10, alone    HPI:  James [name]. is a 69 y.o. male ,seen here as a referral from Dr. Philip Aspen  and upon recommendation of his Urologist for a sleep evaluation.  He was recently told by Dr. Sharlett Iles as well as by his urologist that his nocturia is not related to any prostate pathology that he had no trouble emptying his full bladder. Since he is also obese to Dr. Philip Aspen suggested to have a sleep apnea evaluation as a possible cause for the nocturia. The patient reports that he on some nights have restless legs. He often feels not restored or refreshed in the morning feeling like he didn't get enough sleep at night. He has some muscle cramps as well coughing and shortness of breath has been reported daytime fatigue and hearing loss. He has a history of hypertension, diabetes, obesity, and high cholesterol.   It is review of systems he also endorsed fatigue at 30 points the Epworth Sleepiness Scale at 13 points which indicates hypersomnia and the geriatric depression score at one point.   Dr. Sharlett Iles also listed that the patient has hypogonadism, and is stable on testosterone replacements, he was diagnosed with vitamin B12 deficiency pernicious anemia but the level is still considered borderline and he was given or prescribed sublingual vitamin B12 tablets, he has allergic rhinitis which may interfere with CPAP use as needed. His diabetes is controlled on metformin and glimepiride.  He is also on Diovan which contains a diuretic and takes it in the morning. He has peripheral vascular disease but no claudications and suffered in the past from shingles and has postherpetic neuralgia he has polycythemia vera . Problem #1 on his list was insomnia which really seems to be related to  nocturia as cause for  inability to maintain sleep. He goes to bed at 8 PM and is asleep by 8.05, he jokes. He falls asleep in church , in cinema and during TV or reading. He wakes at 4.30 and has been meanwhile up 4-5 times. His first nocturia is after 4 hours and then every 60- 90 minutes.   He doesn't nap  at work. 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  [name] reports that he has a son that also has sleep apnea and is actually depending happily on the use of his CPAP machine.  He lost one son to suicide. His mother and aunts had sleepiness.   01-29-15 [name] underwent a split night polysomnography on 11-26-14 after he had endorsed the Epworth sleepiness score at an elevated level of 13 points. He has a past medical history including polycythemia vera obesity dyspnea and hypertension and diabetes. In addition he had nocturia more than 3 times at night. He was diagnosed with an AHI of 24.8 and an RDI of 26.5 he slept not on his back but only on his side during the night in the sleep lab he also had very frequent periodic limb movements his PLM arousals were 18.5 per hour. He was titrated to 7 cm CPAP with a reduction of the AHI to 5.4 the now residual apneas were mostly central in nature. His PLM still continued as he was on CPAP. I see him today for his  first visit after the sleep study he endorsed today the Epworth sleepiness score at 6 points which is quite reduced in comparison and his fatigue severity at 19 points. He has been using an O2 sat CPAP machine with a minimum pressure of 5 cm water and a maximum pressure of 12 cm water the 95 st percentile of pressure is 11.2 He is 100% compliance for 30 days and 97% compliance for over 4 hours of use, average time of use of 6 hours 59 minutes EPR level 3 cm water.  I would like for this patient to remain on this AutoSet machine-  his residual AHI is only 1.2 , there are no central apneas. He does have a mild  air leak. He uses a nasal covering mask.  He has some pressure marks around the nose and I suggested to try and nasal pillow. He had not seen one before also the sleep lab staff is supposed to offer several options to the patient. He does like the res med airfit  P 10.I would like to add that the patient still has 3 nocturia is at night in spite of having sufficiently even wonderfully treated.  Interval history from 09-28-15, [name] is here today for follow-up visit with an excellent compliance report. He has used the machine 100% of the last 30 days over 4 hours with an average user time of 7 hours and 54 minutes. He remains on AUTO set 5-12 cm water with 3 cm EPR. He has minor air leaks his 95th percentile pressure is 11.7. The AHI is 0.9. The patient reports sleeping through the night and having only 2 bathroom breaks down from 3 or 4 before being treated for OSA. He tolerates the CPAP well and his Epworth sleepiness score has decreased to 8 points his fatigue severity 27 points and he does not indicate a significant depression on his geriatric depression scale.    Review of Systems: Out of a complete 14 system review, the patient complains of only the following symptoms, and all other reviewed systems are negative. Snoring some times.   It is review of systems he also endorsed fatigue at 27 from 19 from 30 points the Epworth Sleepiness Scale at  8 from 6 from 13 points and the geriatric depression score at one point.  I would like for this patient to remain on this AutoSet machine his residual AHI is only 1.2 there are no central apneas. He does have a mild air leak. He uses a nasal covering mask.  He has some pressure marks around the nose and I suggested to try and nasal pillow. He had not seen one before also the sleep lab staff is supposed to offer several options to the patient. He does like airfit  P 10. I would like to add that the patient still has 3 nocturia is at night in spite  of having sufficiently even wonderfully treated       Social History   Social History  . Marital Status: Married    Spouse Name: N/A  . Number of Children: N/A  . Years of Education: N/A   Occupational History  . Not on file.   Social History Main Topics  . Smoking status: Never Smoker   . Smokeless tobacco: Never Used     Comment: never used tobacco  . Alcohol Use: No     Comment: rarely  . Drug Use: No  . Sexual Activity: Not on file   Other Topics  Concern  . Not on file   Social History Narrative   Right handed.  Caffeine 3 cups daily,  Married 2 kids (one deceased).  College grad.  [employer].     Family History  Problem Relation Age of Onset  . Colon cancer    . Heart failure    . Heart attack    . Lung disease Father     history  . Diabetes Mother     history  . Stroke Mother     history    Past Medical History  Diagnosis Date  . Polycythemia vera(238.4) 11/29/2011  . Obesity   . HTN (hypertension)   . DM (diabetes mellitus) ([place])   . GERD (gastroesophageal reflux disease)   . Hiatal hernia   . Insomnia   . Rhinitis   . Hyperlipidemia   . Situational depression   . Nocturia more than twice per night 11/11/2014    For 6 month, 5 nocturias a night.     Past Surgical History  Procedure Laterality Date  . Cardiac catheterization    . Total knee arthroplasty  2010  . Rotator cuff repair      right  . Carpal tunnel release      bilateral  . Tonsillectomy    . Tendon repair      left arm    Current Outpatient Prescriptions  Medication Sig Dispense Refill  . aspirin EC 81 MG tablet Take 81 mg by mouth daily.    . CVS B-12 500 MCG SUBL   12  . desmopressin (DDAVP) 0.2 MG tablet Take 0.2 mg by mouth at bedtime.     [name] glimepiride (AMARYL) 2 MG tablet Take 2 mg by mouth. Pt not sure how he takes them    . omeprazole (PRILOSEC) 20 MG capsule Take 20 mg by mouth daily.      . ONE TOUCH ULTRA TEST test strip by Other  route as needed.     . rosuvastatin (CRESTOR) 20 MG tablet Take 20 mg by mouth daily.      [name] testosterone cypionate (DEPOTESTOTERONE CYPIONATE) 200 MG/ML injection Inject into the muscle every 21 ( twenty-one) days.      [name] tobramycin (TOBREX) 0.3 % ophthalmic solution 1 DROP IN RIGHT EYE FOUR TIMES A DAY BEGIN ONE DAY PRIOR TO SURGERY AND CONTINUE AS DIRECTED.  5  . valsartan-hydrochlorothiazide (DIOVAN-HCT) 320-25 MG per tablet Take 1 tablet by mouth daily.     [name] VIAGRA 100 MG tablet   1  . gabapentin (NEURONTIN) 300 MG capsule Take 300 mg by mouth 2 (two) times daily.      No current facility-administered medications for this visit.    Allergies as of 09/28/2015  . (No Known Allergies)    Vitals: BP 132/82 mmHg  Pulse 84  Resp 20  Ht 6' 2"  (1.88 m)  Wt 248 lb (112.492 kg)  BMI 31.83 kg/m2 Last Weight:  Wt Readings from Last 1 Encounters:  09/28/15 248 lb (112.492 kg)       Last Height:   Ht Readings from Last 1 Encounters:  09/28/15 6' 2"  (1.88 m)    Physical exam:  General: The patient is awake, alert and appears not in acute distress. The patient is well groomed. Head: Normocephalic, atraumatic. Neck is supple. Mallampati 2 neck circumference: 17.0 . Nasal airflow restricted , right nasion over left nostril.  TMJ is not evident . No  retrognathia is noted .  Cardiovascular:  Regular  rate and rhythm , without  murmurs or carotid bruit, and without distended neck veins. Respiratory: Lungs are clear to auscultation. Skin:  Without evidence of edema, or rash Trunk: BMI is elevated and patient  has normal posture.  Neurologic exam : The patient is awake and alert, oriented to place and time.   Memory subjective described as intact. There is a normal attention span & concentration ability. Speech is fluent without dysarthria, dysphonia or aphasia. Mood and affect are appropriate. Cranial nerves: No change in taste or smell. Pupils are equal and briskly reactive to light.  Visual fields by finger perimetry are intact. Hearing to finger rub intact with hearing aids in place. Facial sensation intact to fine touch. Facial motor strength is symmetric and tongue and uvula move midline. The mucosa membranes are dark/  Deep tendon reflexes: in the  upper and lower extremities are symmetric and intact.    Assessment:  After physical and neurologic examination, review of laboratory studies, imaging, neurophysiology testing and pre-existing records, assessment is  1) confirmed  OSA based on  SPLIT night polysomnoagraphy with 27 AHI. Ridk factors are the patient's BMI, neck , rhintis and built. He has a stocky built, barrel chested. Scoliosis/   The patient was advised of the nature of the diagnosed sleep disorder , the treatment options and risks for general a health and wellness arising from not treating the condition. Visit duration was 15 minutes, with 50% of the face to face  time dedicated to Discussion of CPAP download and the Epworth results. The patient feel that 2 sleep interruptions to urinate are tolerable, he he does not wake up with a dry mouth nor with  headaches.   Plan:  Treatment plan and additional workup :  Continue current outer settings. The patient was advised that should he be able to lose a significant amount of weight 20 pounds or more the machine will automatically adjusted as a new needs of pressure. Therefore he can remain on an AutoSet even his medical situation, body mass index or medication history changes. I would prefer not to set the machine to a specific pressure.   DME is  [facility], he is happy with the service.     Asencion Partridge Lasya Vetter MD  09/28/2015

## 2015-09-28 NOTE — Patient Instructions (Signed)

## 2015-11-19 ENCOUNTER — Other Ambulatory Visit (HOSPITAL_BASED_OUTPATIENT_CLINIC_OR_DEPARTMENT_OTHER): Payer: PRIVATE HEALTH INSURANCE

## 2015-11-19 ENCOUNTER — Ambulatory Visit (HOSPITAL_BASED_OUTPATIENT_CLINIC_OR_DEPARTMENT_OTHER): Payer: PRIVATE HEALTH INSURANCE

## 2015-11-19 ENCOUNTER — Ambulatory Visit (HOSPITAL_BASED_OUTPATIENT_CLINIC_OR_DEPARTMENT_OTHER): Payer: PRIVATE HEALTH INSURANCE | Admitting: Family

## 2015-11-19 VITALS — BP 126/76 | HR 58 | Resp 20

## 2015-11-19 VITALS — BP 142/75 | HR 59 | Temp 98.0°F | Resp 18 | Ht 74.0 in | Wt 245.0 lb

## 2015-11-19 DIAGNOSIS — D45 Polycythemia vera: Secondary | ICD-10-CM | POA: Diagnosis not present

## 2015-11-19 LAB — CBC WITH DIFFERENTIAL (CANCER CENTER ONLY)
BASO#: 0.1 10*3/uL (ref 0.0–0.2)
BASO%: 0.9 % (ref 0.0–2.0)
EOS%: 3.7 % (ref 0.0–7.0)
Eosinophils Absolute: 0.3 10*3/uL (ref 0.0–0.5)
HEMATOCRIT: 44.7 % (ref 38.7–49.9)
HGB: 13 g/dL (ref 13.0–17.1)
LYMPH#: 1.3 10*3/uL (ref 0.9–3.3)
LYMPH%: 18 % (ref 14.0–48.0)
MCH: 19.8 pg — ABNORMAL LOW (ref 28.0–33.4)
MCHC: 29.1 g/dL — AB (ref 32.0–35.9)
MCV: 68 fL — AB (ref 82–98)
MONO#: 1 10*3/uL — AB (ref 0.1–0.9)
MONO%: 13.1 % — ABNORMAL HIGH (ref 0.0–13.0)
NEUT#: 4.8 10*3/uL (ref 1.5–6.5)
NEUT%: 64.3 % (ref 40.0–80.0)
Platelets: 266 10*3/uL (ref 145–400)
RBC: 6.56 10*6/uL — ABNORMAL HIGH (ref 4.20–5.70)
RDW: 22 % — AB (ref 11.1–15.7)
WBC: 7.4 10*3/uL (ref 4.0–10.0)

## 2015-11-19 LAB — CHCC SATELLITE - SMEAR

## 2015-11-19 NOTE — Progress Notes (Signed)
James [name]. presents today for phlebotomy per MD orders. Phlebotomy procedure started at 1135 and ended at 1142. Approximately 500 mls removed. Patient observed for 30 minutes after procedure without any incident. Patient tolerated procedure well. IV needle removed intact.

## 2015-11-19 NOTE — Progress Notes (Signed)
Hematology and Oncology Follow Up Visit  James [name] [id number] [birth year] 69 y.o. 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 Aspirin 81 mg by mouth daily Eleya injections in the eye    Interim History:  James [name] is here today for a follow-up. He is symptomatic with fatigue, SOB with exertion and a ruddy complexion. His last phlebotomy was in February. His Hct today is 44.7. His iron saturation in June was 7%.  He continues to take one baby aspirin daily.  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 He states that he is sensitive to heat and cold both will cause a rash to pop out. At this time he has no rash or lesions but does have occasional pain and sensitivity.  No fever, chills, n/v, cough, dizziness, headaches or changes in vision. No chest pain, palpitations, abdominal pain or changes in bowel or bladder habits.  No swelling, tenderness, numbness or tingling in his extremities.  He is making some lifestyle changes in order to become more healthy and lose weight. He is eating a healthier diet and staying hydrated. He also got a fit bit and has started walking. His weight at this time is down 3 lbs since his last visit.   Medications:    Medication List       This list is accurate as of: 11/19/15 11:30 AM.  Always use your most recent med list.               aspirin EC 81 MG tablet  Take 81 mg by mouth daily.     [facility] B-12 500 MCG Subl  Generic drug:  Cyanocobalamin     desmopressin 0.2 MG tablet  Commonly known as:  DDAVP  Take 0.2 mg by mouth at bedtime.     gabapentin 300 MG capsule  Commonly known as:  NEURONTIN  Take 300 mg by mouth 2 (two) times daily.     glimepiride 2 MG tablet  Commonly known as:  AMARYL  Take 2 mg by mouth. Pt not sure how he takes them     metFORMIN 500 MG tablet  Commonly known as:  GLUCOPHAGE  Take 1,000 mg by mouth 2 (two) times daily with a meal.     metoprolol succinate 100 MG 24 hr tablet  Commonly known as:  TOPROL-XL  Take 100 mg by mouth daily.     omeprazole 20 MG capsule  Commonly known as:  PRILOSEC  Take 20 mg by mouth daily.     ONE TOUCH ULTRA TEST test strip  Generic drug:  glucose blood  by Other route as needed.     rosuvastatin 20 MG tablet  Commonly known as:  CRESTOR  Take 20 mg by mouth daily.     testosterone cypionate 200 MG/ML injection  Commonly known as:  DEPOTESTOSTERONE CYPIONATE  Inject into the muscle every 21 ( twenty-one) days.     tobramycin 0.3 % ophthalmic solution  Commonly known as:  TOBREX  1 DROP IN RIGHT EYE FOUR TIMES A DAY BEGIN ONE DAY PRIOR TO SURGERY AND CONTINUE AS DIRECTED.     valsartan-hydrochlorothiazide 320-25 MG tablet  Commonly known as:  DIOVAN-HCT  Take 1 tablet by mouth daily.     VIAGRA 100 MG tablet  Generic drug:  sildenafil  Allergies: No Known Allergies  Past Medical History, Surgical history, Social history, and Family History were reviewed and updated.  Review of Systems: All other 10 point review of systems is negative.   Physical Exam:  height is 6' 2"  (1.88 m) and weight is 245 lb (111.131 kg). His oral temperature is 98 F (36.7 C). His blood pressure is 142/75 and his pulse is 59. His respiration is 18.   Wt Readings from Last 3 Encounters:  11/19/15 245 lb (111.131 kg)  09/28/15 248 lb (112.492 kg)  06/02/15 239 lb (108.41 kg)    Ocular: Sclerae unicteric, pupils equal, round and reactive to light Ear-nose-throat: Oropharynx clear, dentition fair Lymphatic: No cervical supraclavicular or axillary adenopathy Lungs no rales or rhonchi, good excursion bilaterally Heart regular rate and rhythm, no murmur appreciated Abd soft, nontender, positive bowel sounds MSK no focal spinal tenderness, no joint edema Neuro:  non-focal, well-oriented, appropriate affect Breasts: Deferred  Lab Results  Component Value Date   WBC 7.4 11/19/2015   HGB 13.0 11/19/2015   HCT 44.7 11/19/2015   MCV 68* 11/19/2015   PLT 266 11/19/2015   Lab Results  Component Value Date   FERRITIN 13* 06/02/2015   IRON 29* 06/02/2015   TIBC 441* 06/02/2015   UIBC 412* 06/02/2015   IRONPCTSAT 7* 06/02/2015   Lab Results  Component Value Date   RETICCTPCT 1.7 08/30/2011   RBC 6.56* 11/19/2015   RETICCTABS 94.0 08/30/2011   No results found for: KPAFRELGTCHN, LAMBDASER, KAPLAMBRATIO No results found for: IGGSERUM, IGA, IGMSERUM No results found for: Ronnald Ramp, A1GS, A2GS, Violet Baldy, MSPIKE, SPEI   Chemistry      Component Value Date/Time   NA 137 06/02/2015 1328   K 4.6 06/02/2015 1328   CL 106 06/02/2015 1328   CO2 22 06/02/2015 1328   BUN 19 06/02/2015 1328   CREATININE 1.31 06/02/2015 1328      Component Value Date/Time   CALCIUM 10.7* 06/02/2015 1328   ALKPHOS 79 06/02/2015 1328   AST 19 06/02/2015 1328   ALT 20 06/02/2015 1328   BILITOT 0.4 06/02/2015 1328     Impression and Plan: James [name] is 69 yo white male with polycythemia. He was last phlebotomy was in February. He is symptomatic at this time with fatigue, SOB with exertion and a ruddy complexion.  His Hct is 44.7. We will go ahead and phlebotomize him today.  We will plan to see him back in 6 months for labs and follow-up.  He will contact us with any questions or concerns. We can certainly see him sooner if need be.   Eliezer Bottom, NP 12/2/201611:30 AM

## 2015-11-19 NOTE — Patient Instructions (Signed)
Therapeutic Phlebotomy, Care After  Refer to this sheet in the next few weeks. These instructions provide you with information about caring for yourself after your procedure. Your health care provider may also give you more specific instructions. Your treatment has been planned according to current medical practices, but problems sometimes occur. Call your health care provider if you have any problems or questions after your procedure.  WHAT TO EXPECT AFTER THE PROCEDURE  After your procedure, it is common to have:   Light-headedness or dizziness. You may feel faint.   Nausea.   Tiredness.  HOME CARE INSTRUCTIONS  Activities   Return to your normal activities as directed by your health care provider. Most people can go back to their normal activities right away.   Avoid strenuous physical activity and heavy lifting or pulling for about 5 hours after the procedure. Do not lift anything that is heavier than 10 lb (4.5 kg).   Athletes should avoid strenuous exercise for at least 12 hours.   Change positions slowly for the remainder of the day. This will help to prevent light-headedness or fainting.   If you feel light-headed, lie down until the feeling goes away.  Eating and Drinking   Be sure to eat well-balanced meals for the next 24 hours.   Drink enough fluid to keep your urine clear or pale yellow.   Avoid drinking alcohol on the day that you had the procedure.  Care of the Needle Insertion Site   Keep your bandage dry. You can remove the bandage after about 5 hours or as directed by your health care provider.   If you have bleeding from the needle insertion site, elevate your arm and press firmly on the site until the bleeding stops.   If you have bruising at the site, apply ice to the area:   Put ice in a plastic bag.   Place a towel between your skin and the bag.   Leave the ice on for 20 minutes, 2-3 times a day for the first 24 hours.   If the swelling does not go away after 24 hours, apply  a warm, moist washcloth to the area for 20 minutes, 2-3 times a day.  General Instructions   Avoid smoking for at least 30 minutes after the procedure.   Keep all follow-up visits as directed by your health care provider. It is important to continue with further therapeutic phlebotomy treatments as directed.  SEEK MEDICAL CARE IF:   You have redness, swelling, or pain at the needle insertion site.   You have fluid, blood, or pus coming from the needle insertion site.   You feel light-headed, dizzy, or nauseated, and the feeling does not go away.   You notice new bruising at the needle insertion site.   You feel weaker than normal.   You have a fever or chills.  SEEK IMMEDIATE MEDICAL CARE IF:   You have severe nausea or vomiting.   You have chest pain.   You have trouble breathing.    This information is not intended to replace advice given to you by your health care provider. Make sure you discuss any questions you have with your health care provider.    Document Released: 05/08/2011 Document Revised: 04/20/2015 Document Reviewed: 11/30/2014  Elsevier Interactive Patient Education 2016 [employer]

## 2015-11-29 ENCOUNTER — Ambulatory Visit (AMBULATORY_SURGERY_CENTER): Payer: Self-pay | Admitting: *Deleted

## 2015-11-29 VITALS — Ht 66.0 in | Wt 245.0 lb

## 2015-11-29 DIAGNOSIS — Z1211 Encounter for screening for malignant neoplasm of colon: Secondary | ICD-10-CM

## 2015-11-29 MED ORDER — SUPREP BOWEL PREP KIT 17.5-3.13-1.6 GM/177ML PO SOLN: 1.0000 | Freq: Once | ORAL | Status: DC

## 2015-11-29 NOTE — Progress Notes (Signed)
Patient denies any allergies to egg or soy products. Patient denies complications with anesthesia/sedation.  Patient denies oxygen use at home and denies diet medications. Emmi instructions for colonoscopy explained but patient denied.

## 2015-12-02 ENCOUNTER — Telehealth: Payer: Self-pay | Admitting: Cardiovascular Disease

## 2015-12-02 ENCOUNTER — Other Ambulatory Visit: Payer: PRIVATE HEALTH INSURANCE

## 2015-12-02 ENCOUNTER — Ambulatory Visit: Payer: PRIVATE HEALTH INSURANCE | Admitting: Hematology & Oncology

## 2015-12-02 NOTE — Telephone Encounter (Signed)
Received records from [facility] for appointment on 12/29/15 with Dr Oval Linsey.  Records given to [facility] (medical records) for Dr Blenda Mounts schedule on 12/29/15. lp

## 2015-12-03 ENCOUNTER — Other Ambulatory Visit: Payer: PRIVATE HEALTH INSURANCE

## 2015-12-03 ENCOUNTER — Ambulatory Visit: Payer: PRIVATE HEALTH INSURANCE | Admitting: Hematology & Oncology

## 2015-12-10 ENCOUNTER — Encounter: Payer: Self-pay | Admitting: Gastroenterology

## 2015-12-10 ENCOUNTER — Ambulatory Visit (AMBULATORY_SURGERY_CENTER): Payer: PRIVATE HEALTH INSURANCE | Admitting: Gastroenterology

## 2015-12-10 VITALS — BP 99/68 | HR 56 | Temp 96.0°F | Resp 30 | Ht 66.0 in | Wt 245.0 lb

## 2015-12-10 DIAGNOSIS — D120 Benign neoplasm of cecum: Secondary | ICD-10-CM

## 2015-12-10 DIAGNOSIS — Z1211 Encounter for screening for malignant neoplasm of colon: Secondary | ICD-10-CM

## 2015-12-10 DIAGNOSIS — D369 Benign neoplasm, unspecified site: Secondary | ICD-10-CM

## 2015-12-10 HISTORY — DX: Benign neoplasm, unspecified site: D36.9

## 2015-12-10 LAB — GLUCOSE, CAPILLARY
Glucose-Capillary: 120 mg/dL — ABNORMAL HIGH (ref 65–99)
Glucose-Capillary: 97 mg/dL (ref 65–99)

## 2015-12-10 MED ORDER — SODIUM CHLORIDE 0.9 % IV SOLN: 500.0000 mL | INTRAVENOUS | Status: DC

## 2015-12-10 NOTE — Progress Notes (Signed)
Report to PACU, RN, vss, BBS= Clear.  

## 2015-12-10 NOTE — Patient Instructions (Signed)
YOU HAD AN ENDOSCOPIC PROCEDURE TODAY AT [facility]:   Refer to the procedure report that was given to you for any specific questions about what was found during the examination.  If the procedure report does not answer your questions, please call your gastroenterologist to clarify.  If you requested that your care partner not be given the details of your procedure findings, then the procedure report has been included in a sealed envelope for you to review at your convenience later.  YOU SHOULD EXPECT: Some feelings of bloating in the abdomen. Passage of more gas than usual.  Walking can help get rid of the air that was put into your GI tract during the procedure and reduce the bloating. If you had a lower endoscopy (such as a colonoscopy or flexible sigmoidoscopy) you may notice spotting of blood in your stool or on the toilet paper. If you underwent a bowel prep for your procedure, you may not have a normal bowel movement for a few days.  Please Note:  You might notice some irritation and congestion in your nose or some drainage.  This is from the oxygen used during your procedure.  There is no need for concern and it should clear up in a day or so.  SYMPTOMS TO REPORT IMMEDIATELY:   Following lower endoscopy (colonoscopy or flexible sigmoidoscopy):  Excessive amounts of blood in the stool  Significant tenderness or worsening of abdominal pains  Swelling of the abdomen that is new, acute  Fever of 100F or higher   For urgent or emergent issues, a gastroenterologist can be reached at any hour by calling [phone].   DIET: Your first meal following the procedure should be a small meal and then it is ok to progress to your normal diet. Heavy or fried foods are harder to digest and may make you feel nauseous or bloated.  Likewise, meals heavy in dairy and vegetables can increase bloating.  Drink plenty of fluids but you should avoid alcoholic beverages for 24  hours.  ACTIVITY:  You should plan to take it easy for the rest of today and you should NOT DRIVE or use heavy machinery until tomorrow (because of the sedation medicines used during the test).    FOLLOW UP: Our staff will call the number listed on your records the next business day following your procedure to check on you and address any questions or concerns that you may have regarding the information given to you following your procedure. If we do not reach you, we will leave a message.  However, if you are feeling well and you are not experiencing any problems, there is no need to return our call.  We will assume that you have returned to your regular daily activities without incident.  If any biopsies were taken you will be contacted by phone or by letter within the next 1-3 weeks.  Please call us at [phone] if you have not heard about the biopsies in 3 weeks.    SIGNATURES/CONFIDENTIALITY: You and/or your care partner have signed paperwork which will be entered into your electronic medical record.  These signatures attest to the fact that that the information above on your After Visit Summary has been reviewed and is understood.  Full responsibility of the confidentiality of this discharge information lies with you and/or your care-partner.  Polyps/ diverticulosis handout given Await pathology results No Aspirin or NSAIDS for 2 weeks

## 2015-12-10 NOTE — Op Note (Signed)
[facility]  [address] [place], [address]   COLONOSCOPY PROCEDURE REPORT  PATIENT: James [name], James [name]  MR#: [id number] BIRTHDATE: 05-08-1946 , 69  yrs. old GENDER: male ENDOSCOPIST: Yetta Flock, MD REFERRED BY: Leanna Battles MD PROCEDURE DATE:  12/10/2015 PROCEDURE:   Colonoscopy, screening, Colonoscopy with snare polypectomy, and Colonoscopy with biopsy First Screening Colonoscopy - Avg.  risk and is 50 yrs.  old or older - No.  Prior Negative Screening - Now for repeat screening. 10 or more years since last screening  History of Adenoma - Now for follow-up colonoscopy & has been > or = to 3 yrs.  N/A  Polyps removed today? Yes ASA CLASS:   Class III INDICATIONS:Screening for colonic neoplasia and Colorectal Neoplasm Risk Assessment for this procedure is average risk. MEDICATIONS: Propofol 300 mg IV  DESCRIPTION OF PROCEDURE:   After the risks benefits and alternatives of the procedure were thoroughly explained, informed consent was obtained.  The digital rectal exam revealed no abnormalities of the rectum.   The [id number]  endoscope was introduced through the anus and advanced to the cecum, which was identified by both the appendix and ileocecal valve. No adverse events experienced.   The quality of the prep was adequate  The instrument was then slowly withdrawn as the colon was fully examined. Estimated blood loss is zero unless otherwise noted in this procedure report.   COLON FINDINGS: Three diminutive (2-31m) sessile polyps were noted in the cecum and removed with cold forceps.  Three sessile polyps, two x 516mand one 3m44mwere noted in the cecum and removed via cold snare.  There was moderate diverticulosis in the sigmoid colon. The remainder of the colon was normal. The right colon had ongoing spasms throughout this part of the examination.  Retroflexed views revealed internal hemorrhoids. The time to cecum = 2.4 Withdrawal time =  23.2   The scope was withdrawn and the procedure completed. COMPLICATIONS: There were no immediate complications.  ENDOSCOPIC IMPRESSION: 6 small cecal polyps removed as outlined above Moderate diverticulosis in the sigmoid colon Internal hemorrhoids  RECOMMENDATIONS: No NSAIDs for 2 weeks post-polypectomy Await pathology results Resume diet Resume medications  eSigned:  SteYetta FlockD 12/10/2015 10:14 AM   cc:  James [name], the patient   PATIENT NAME:  James [name], James [name]#: [id number]

## 2015-12-10 NOTE — Progress Notes (Signed)
Called to room to assist during endoscopic procedure.  Patient ID and intended procedure confirmed with present staff. Received instructions for my participation in the procedure from the performing physician.  

## 2015-12-14 ENCOUNTER — Telehealth: Payer: Self-pay | Admitting: *Deleted

## 2015-12-14 NOTE — Telephone Encounter (Signed)
  Follow up Call-  Call back number 12/10/2015  Post procedure Call Back phone  # [phone]  Permission to leave phone message Yes     Patient questions:  Do you have a fever, pain , or abdominal swelling? No. Pain Score  0 *  Have you tolerated food without any problems? Yes.    Have you been able to return to your normal activities? Yes.    Do you have any questions about your discharge instructions: Diet   No. Medications  No. Follow up visit  No.  Do you have questions or concerns about your Care? No.  Actions: * If pain score is 4 or above: No action needed, pain <4.

## 2015-12-17 ENCOUNTER — Encounter: Payer: Self-pay | Admitting: Gastroenterology

## 2015-12-24 ENCOUNTER — Ambulatory Visit: Payer: PRIVATE HEALTH INSURANCE | Admitting: Cardiovascular Disease

## 2015-12-27 NOTE — Progress Notes (Signed)
HPI: 70 year old male for evaluation of dyspnea. Nuclear study May 2011 showed ejection fraction 75% and normal perfusion. Echocardiogram May 2011 showed normal LV function, moderate left ventricular hypertrophy, impaired relaxation, mild LVOT gradient. Cardiac catheterization June 2011 showed normal LV function and no obstructive coronary disease. Patient has dyspnea with exertion but no orthopnea, PND, pedal edema, chest pain, palpitations or syncope.  Current Outpatient Prescriptions  Medication Sig Dispense Refill  . aspirin EC 81 MG tablet Take 81 mg by mouth daily.    . CVS B-12 500 MCG SUBL   12  . desmopressin (DDAVP) 0.2 MG tablet Take 0.2 mg by mouth at bedtime.     [name] glimepiride (AMARYL) 2 MG tablet Take 2 mg by mouth. Pt not sure how he takes them    . metFORMIN (GLUCOPHAGE) 500 MG tablet Take 1,000 mg by mouth 2 (two) times daily with a meal.    . metoprolol succinate (TOPROL-XL) 100 MG 24 hr tablet Take 100 mg by mouth daily.  11  . omeprazole (PRILOSEC) 20 MG capsule Take 20 mg by mouth daily.      . ONE TOUCH ULTRA TEST test strip by Other route as needed.     . rosuvastatin (CRESTOR) 20 MG tablet Take 20 mg by mouth daily.      [name] testosterone cypionate (DEPOTESTOTERONE CYPIONATE) 200 MG/ML injection Inject into the muscle every 21 ( twenty-one) days.      [name] tobramycin (TOBREX) 0.3 % ophthalmic solution 1 DROP IN RIGHT EYE FOUR TIMES A DAY BEGIN ONE DAY PRIOR TO SURGERY AND CONTINUE AS DIRECTED.  5  . valsartan-hydrochlorothiazide (DIOVAN-HCT) 320-25 MG per tablet Take 1 tablet by mouth daily.     [name] VIAGRA 100 MG tablet   1   No current facility-administered medications for this visit.    No Known Allergies   Past Medical History  Diagnosis Date  . Obesity   . HTN (hypertension)   . DM (diabetes mellitus) ([place])   . GERD (gastroesophageal reflux disease)   . Hiatal hernia   . Insomnia     resolved by using CPAP  . Rhinitis   . Hyperlipidemia   . Situational  depression   . Nocturia more than twice per night 11/11/2014    For 6 month, 5 nocturias a night.   . Sleep apnea     uses CPAP every night  . History of shingles 04/03/2014  . Polycythemia vera(238.4)     History    Past Surgical History  Procedure Laterality Date  . Cardiac catheterization      greater 10 yrs ago, normal  . Total knee arthroplasty Right 2010  . Rotator cuff repair      right  . Carpal tunnel release      bilateral  . Tonsillectomy    . Tendon repair      left arm  . Wisdom tooth extraction    . Colonoscopy  06/2005    [facility] Dr Lajoyce Corners    Social History   Social History  . Marital Status: Married    Spouse Name: N/A  . Number of Children: 1  . Years of Education: N/A   Occupational History  . Not on file.   Social History Main Topics  . Smoking status: Never Smoker   . Smokeless tobacco: Never Used     Comment: never used tobacco  . Alcohol Use: 0.0 oz/week    0 Standard drinks or equivalent per week  Comment: rarely   . Drug Use: No  . Sexual Activity: Not on file   Other Topics Concern  . Not on file   Social History Narrative   Right handed.  Caffeine 3 cups daily,  Married 2 kids (one deceased).  College grad.  [employer].     Family History  Problem Relation Age of Onset  . Colon cancer    . Heart failure    . Heart attack    . Lung disease Father     history  . Diabetes Mother     history  . Stroke Mother     history  . Colon polyps Neg Hx   . Rectal cancer Neg Hx   . Stomach cancer Neg Hx   . Esophageal cancer Neg Hx     ROS: Arthralgias but no fevers or chills, productive cough, hemoptysis, dysphasia, odynophagia, melena, hematochezia, dysuria, hematuria, rash, seizure activity, orthopnea, PND, pedal edema, claudication. Remaining systems are negative.  Physical Exam:   Blood pressure 114/66, pulse 69, height 5' 6"  (1.676 m), weight 241 lb (109.317 kg).  General:  Well  developed/obesity in NAD Skin warm/dry Patient not depressed No peripheral clubbing Back-normal HEENT-normal/normal eyelids Neck supple/normal carotid upstroke bilaterally; no bruits; no JVD; no thyromegaly chest - CTA/ normal expansion CV - RRR/normal S1 and S2; no murmurs, rubs or gallops;  PMI nondisplaced Abdomen -NT/ND, no HSM, no mass, + bowel sounds, no bruit 2+ femoral pulses, no bruits Ext-no edema, chords, 2+ DP Neuro-grossly nonfocal  ECG Sinus rhythm at a rate of 69. Normal axis.No ST changes.

## 2015-12-29 ENCOUNTER — Ambulatory Visit: Payer: PRIVATE HEALTH INSURANCE | Admitting: Cardiovascular Disease

## 2015-12-31 ENCOUNTER — Encounter: Payer: Self-pay | Admitting: *Deleted

## 2015-12-31 ENCOUNTER — Ambulatory Visit (INDEPENDENT_AMBULATORY_CARE_PROVIDER_SITE_OTHER): Payer: PRIVATE HEALTH INSURANCE | Admitting: Cardiology

## 2015-12-31 ENCOUNTER — Encounter: Payer: Self-pay | Admitting: Cardiology

## 2015-12-31 VITALS — BP 114/66 | HR 69 | Ht 66.0 in | Wt 241.0 lb

## 2015-12-31 DIAGNOSIS — I10 Essential (primary) hypertension: Secondary | ICD-10-CM

## 2015-12-31 DIAGNOSIS — E785 Hyperlipidemia, unspecified: Secondary | ICD-10-CM

## 2015-12-31 DIAGNOSIS — R0600 Dyspnea, unspecified: Secondary | ICD-10-CM

## 2015-12-31 NOTE — Assessment & Plan Note (Signed)
Blood pressure controlled. Continue present medications. 

## 2015-12-31 NOTE — Patient Instructions (Signed)
  Testing/Procedures:  Your physician has requested that you have a lexiscan myoview. For further information please visit [email or web address]. Please follow instruction sheet, as given.    Follow-Up:  Your physician recommends that you schedule a follow-up appointment in: AS NEEDED PENDING TEST RESULTS  If you need a refill on your cardiac medications before your next appointment, please call your pharmacy.

## 2015-12-31 NOTE — Assessment & Plan Note (Signed)
Continue statin. 

## 2015-12-31 NOTE — Assessment & Plan Note (Signed)
Etiology unclear. Question related to sleep apnea, deconditioning and obesity. He would like to initiate an exercise program. We will plan a Lexiscan nuclear study for risk stratification. Note he cannot ambulate well on a treadmill because of arthralgias.

## 2016-01-11 ENCOUNTER — Telehealth (HOSPITAL_COMMUNITY): Payer: Self-pay

## 2016-01-11 NOTE — Telephone Encounter (Signed)
Encounter complete. 

## 2016-01-13 ENCOUNTER — Ambulatory Visit (HOSPITAL_COMMUNITY)
Admission: RE | Admit: 2016-01-13 | Discharge: 2016-01-13 | Disposition: A | Discharge status: Home / Self Care | Payer: PRIVATE HEALTH INSURANCE | Source: Ambulatory Visit | Attending: Internal Medicine | Admitting: Internal Medicine

## 2016-01-13 DIAGNOSIS — R9439 Abnormal result of other cardiovascular function study: Secondary | ICD-10-CM | POA: Diagnosis not present

## 2016-01-13 DIAGNOSIS — R0609 Other forms of dyspnea: Secondary | ICD-10-CM | POA: Diagnosis not present

## 2016-01-13 DIAGNOSIS — Z6839 Body mass index (BMI) 39.0-39.9, adult: Secondary | ICD-10-CM | POA: Insufficient documentation

## 2016-01-13 DIAGNOSIS — E663 Overweight: Secondary | ICD-10-CM | POA: Diagnosis not present

## 2016-01-13 DIAGNOSIS — R0600 Dyspnea, unspecified: Secondary | ICD-10-CM | POA: Insufficient documentation

## 2016-01-13 DIAGNOSIS — G4733 Obstructive sleep apnea (adult) (pediatric): Secondary | ICD-10-CM | POA: Insufficient documentation

## 2016-01-13 DIAGNOSIS — Z8249 Family history of ischemic heart disease and other diseases of the circulatory system: Secondary | ICD-10-CM | POA: Insufficient documentation

## 2016-01-13 DIAGNOSIS — E119 Type 2 diabetes mellitus without complications: Secondary | ICD-10-CM | POA: Diagnosis not present

## 2016-01-13 DIAGNOSIS — I10 Essential (primary) hypertension: Secondary | ICD-10-CM | POA: Diagnosis not present

## 2016-01-13 MED ORDER — TECHNETIUM TC 99M SESTAMIBI GENERIC - CARDIOLITE
31.1000 | Freq: Once | INTRAVENOUS | Status: AC | PRN
  Administered 2016-01-13: 31.1 via INTRAVENOUS

## 2016-01-13 MED ORDER — REGADENOSON 0.4 MG/5ML IV SOLN
0.4000 mg | Freq: Once | INTRAVENOUS | Status: AC
  Administered 2016-01-13: 0.4 mg via INTRAVENOUS

## 2016-01-13 MED ORDER — TECHNETIUM TC 99M SESTAMIBI GENERIC - CARDIOLITE
10.9000 | Freq: Once | INTRAVENOUS | Status: AC | PRN
  Administered 2016-01-13: 10.9 via INTRAVENOUS

## 2016-01-14 ENCOUNTER — Encounter: Payer: Self-pay | Admitting: Cardiology

## 2016-01-14 LAB — MYOCARDIAL PERFUSION IMAGING
CHL CUP NUCLEAR SRS: 2
CHL CUP NUCLEAR SSS: 4
LV dias vol: 102 mL
LV sys vol: 43 mL
Peak HR: 80 {beats}/min
Rest HR: 75 {beats}/min
SDS: 2
TID: 1.23

## 2016-01-14 NOTE — Telephone Encounter (Signed)
Follow Up  Pt called, Request a call back to discuss Stress test. Please call

## 2016-01-17 NOTE — Telephone Encounter (Signed)
This encounter was created in error - please disregard.

## 2016-04-08 ENCOUNTER — Ambulatory Visit (HOSPITAL_COMMUNITY)
Admission: EM | Admit: 2016-04-08 | Discharge: 2016-04-08 | Disposition: A | Discharge status: Home / Self Care | Payer: PRIVATE HEALTH INSURANCE | Attending: Emergency Medicine | Admitting: Emergency Medicine

## 2016-04-08 ENCOUNTER — Encounter (HOSPITAL_COMMUNITY): Payer: Self-pay | Admitting: *Deleted

## 2016-04-08 DIAGNOSIS — H109 Unspecified conjunctivitis: Secondary | ICD-10-CM | POA: Diagnosis not present

## 2016-04-08 HISTORY — DX: Unspecified macular degeneration: H35.30

## 2016-04-08 MED ORDER — TOBRAMYCIN 0.3 % OP SOLN: 1.0000 [drp] | OPHTHALMIC | Status: DC

## 2016-04-08 NOTE — ED Provider Notes (Signed)
CSN: [id number]     Arrival date & time 04/08/16  1950 History   First MD Initiated Contact with Patient 04/08/16 2001     Chief Complaint  Patient presents with  . Conjunctivitis   (Consider location/radiation/quality/duration/timing/severity/associated sxs/prior Treatment) HPI Comments: 70 year old male states that this afternoon he noted right eye irritation, erythema, swelling and drainage Denies visual changes.   Past Medical History  Diagnosis Date  . Obesity   . HTN (hypertension)   . DM (diabetes mellitus) ([place])   . GERD (gastroesophageal reflux disease)   . Hiatal hernia   . Insomnia     resolved by using CPAP  . Rhinitis   . Hyperlipidemia   . Situational depression   . Nocturia more than twice per night 11/11/2014    For 6 month, 5 nocturias a night.   . Sleep apnea     uses CPAP every night  . History of shingles 04/03/2014  . Polycythemia vera(238.4)     History  . Macular degeneration    Past Surgical History  Procedure Laterality Date  . Cardiac catheterization      greater 10 yrs ago, normal  . Total knee arthroplasty Right 2010  . Rotator cuff repair      right  . Carpal tunnel release      bilateral  . Tonsillectomy    . Tendon repair      left arm  . Wisdom tooth extraction    . Colonoscopy  06/2005    [facility] Dr Lajoyce Corners   Family History  Problem Relation Age of Onset  . Colon cancer    . Heart failure    . Heart attack    . Lung disease Father     history  . Diabetes Mother     history  . Stroke Mother     history  . Colon polyps Neg Hx   . Rectal cancer Neg Hx   . Stomach cancer Neg Hx   . Esophageal cancer Neg Hx    Social History  Substance Use Topics  . Smoking status: Never Smoker   . Smokeless tobacco: Never Used     Comment: never used tobacco  . Alcohol Use: 0.0 oz/week    0 Standard drinks or equivalent per week     Comment: rarely     Review of Systems  Constitutional: Negative.   HENT: Negative.   Eyes:  Positive for photophobia, discharge, redness, itching and visual disturbance.  Respiratory: Negative.   All other systems reviewed and are negative.   Allergies  Review of patient's allergies indicates no known allergies.  Home Medications   Prior to Admission medications   Medication Sig Start Date End Date Taking? Authorizing Provider  aspirin EC 81 MG tablet Take 81 mg by mouth daily.   Yes Historical Provider, MD  [facility] B-12 500 MCG SUBL  09/17/14  Yes Historical Provider, MD  desmopressin (DDAVP) 0.2 MG tablet Take 0.2 mg by mouth at bedtime.  03/21/14  Yes Historical Provider, MD  glimepiride (AMARYL) 2 MG tablet Take 2 mg by mouth. Pt not sure how he takes them 03/21/14  Yes Historical Provider, MD  metFORMIN (GLUCOPHAGE) 500 MG tablet Take 1,000 mg by mouth 2 (two) times daily with a meal.   Yes Historical Provider, MD  metoprolol succinate (TOPROL-XL) 100 MG 24 hr tablet Take 100 mg by mouth daily. 11/12/15  Yes Historical Provider, MD  omeprazole (PRILOSEC) 20 MG capsule Take 20 mg by mouth daily.  Yes Historical Provider, MD  ONE TOUCH ULTRA TEST test strip by Other route as needed.  08/04/13  Yes Historical Provider, MD  rosuvastatin (CRESTOR) 20 MG tablet Take 20 mg by mouth daily.     Yes Historical Provider, MD  testosterone cypionate (DEPOTESTOTERONE CYPIONATE) 200 MG/ML injection Inject into the muscle every 21 ( twenty-one) days.     Yes Leanna Battles, MD  UNKNOWN TO PATIENT Right eye injections every 2 months   Yes Historical Provider, MD  valsartan-hydrochlorothiazide (DIOVAN-HCT) 320-25 MG per tablet Take 1 tablet by mouth daily.  07/20/13  Yes Historical Provider, MD  VIAGRA 100 MG tablet  10/26/14  Yes Historical Provider, MD  tobramycin (TOBREX) 0.3 % ophthalmic solution Place 1 drop into the right eye every 4 (four) hours. X 5 days 04/08/16   Janne Napoleon, NP   Meds Ordered and Administered this Visit  Medications - No data to display  There were no vitals taken for this  visit. No data found.   Physical Exam  Constitutional: He is oriented to person, place, and time. He appears well-developed and well-nourished. No distress.  Eyes: EOM are normal. Pupils are equal, round, and reactive to light.  Lower conjunctival erythema and swelling. Small amount of yellow thick drainage.upper eyelid mildly erythematous and puffy. Sclera with minor injection.No foreign body seen. Upper lid everted. Anterior chamber is clear.  Neck: Normal range of motion. Neck supple.  Cardiovascular: Normal rate.   Pulmonary/Chest: Effort normal.  Musculoskeletal: Normal range of motion.  Neurological: He is alert and oriented to person, place, and time.  Skin: Skin is warm and dry.  Psychiatric: He has a normal mood and affect.  Nursing note and vitals reviewed.   ED Course  Procedures (including critical care time)  Labs Review Labs Reviewed - No data to display  Imaging Review No results found.   Visual Acuity Review  Right Eye Distance: 20/40 corrected Left Eye Distance: 20/35 corrected Bilateral Distance: 20/25 corrected  Right Eye Near:   Left Eye Near:    Bilateral Near:         MDM   1. Conjunctivitis of right eye    Meds ordered this encounter  Medications  . UNKNOWN TO PATIENT    Sig: Right eye injections every 2 months  . tobramycin (TOBREX) 0.3 % ophthalmic solution    Sig: Place 1 drop into the right eye every 4 (four) hours. X 5 days    Dispense:  5 mL    Refill:  0    Order Specific Question:  Supervising Provider    Answer:  Melony Overly [[id number]]       Janne Napoleon, NP 04/08/16 2030

## 2016-04-08 NOTE — Discharge Instructions (Signed)
How to Use Eye Drops and Eye Ointments HOW TO APPLY EYE DROPS Follow these steps when applying eye drops:  Wash your hands.  Tilt your head back.  Put a finger under your eye and use it to gently pull your lower lid downward. Keep that finger in place.  Using your other hand, hold the dropper between your thumb and index finger.  Position the dropper just over the edge of the lower lid. Hold it as close to your eye as you can without touching the dropper to your eye.  Steady your hand. One way to do this is to lean your index finger against your brow.  Look up.  Slowly and gently squeeze one drop of medicine into your eye.  Close your eye.  Place a finger between your lower eyelid and your nose. Press gently for 2 minutes. This increases the amount of time that the medicine is exposed to the eye. It also reduces side effects that can develop if the drop gets into the bloodstream through the nose. HOW TO APPLY EYE OINTMENTS Follow these steps when applying eye ointments:  Wash your hands.  Put a finger under your eye and use it to gently pull your lower lid downward. Keep that finger in place.  Using your other hand, place the tip of the tube between your thumb and index finger with the remaining fingers braced against your cheek or nose.  Hold the tube just over the edge of your lower lid without touching the tube to your lid or eyeball.  Look up.  Line the inner part of your lower lid with ointment.  Gently pull up on your upper lid and look down. This will force the ointment to spread over the surface of the eye.  Release the upper lid.  If you can, close your eyes for 1-2 minutes. Do not rub your eyes. If you applied the ointment correctly, your vision will be blurry for a few minutes. This is normal. ADDITIONAL INFORMATION  Make sure to use the eye drops or ointment as told by your health care provider.  If you have been told to use both eye drops and an eye  ointment, apply the eye drops first, then wait 3-4 minutes before you apply the ointment.  Try not to touch the tip of the dropper or tube to your eye. A dropper or tube that has touched the eye can become contaminated.   This information is not intended to replace advice given to you by your health care provider. Make sure you discuss any questions you have with your health care provider.   Document Released: 03/12/2001 Document Revised: 04/20/2015 Document Reviewed: 11/30/2014 Elsevier Interactive Patient Education 2016 [employer]  Bacterial Conjunctivitis Bacterial conjunctivitis, commonly called pink eye, is an inflammation of the clear membrane that covers the white part of the eye (conjunctiva). The inflammation can also happen on the underside of the eyelids. The blood vessels in the conjunctiva become inflamed, causing the eye to become red or pink. Bacterial conjunctivitis may spread easily from one eye to another and from person to person (contagious).  CAUSES  Bacterial conjunctivitis is caused by bacteria. The bacteria may come from your own skin, your upper respiratory tract, or from someone else with bacterial conjunctivitis. SYMPTOMS  The normally white color of the eye or the underside of the eyelid is usually pink or red. The pink eye is usually associated with irritation, tearing, and some sensitivity to light. Bacterial conjunctivitis is often  associated with a thick, yellowish discharge from the eye. The discharge may turn into a crust on the eyelids overnight, which causes your eyelids to stick together. If a discharge is present, there may also be some blurred vision in the affected eye. DIAGNOSIS  Bacterial conjunctivitis is diagnosed by your caregiver through an eye exam and the symptoms that you report. Your caregiver looks for changes in the surface tissues of your eyes, which may point to the specific type of conjunctivitis. A sample of any discharge may be collected on  a cotton-tip swab if you have a severe case of conjunctivitis, if your cornea is affected, or if you keep getting repeat infections that do not respond to treatment. The sample will be sent to a lab to see if the inflammation is caused by a bacterial infection and to see if the infection will respond to antibiotic medicines. TREATMENT   Bacterial conjunctivitis is treated with antibiotics. Antibiotic eyedrops are most often used. However, antibiotic ointments are also available. Antibiotics pills are sometimes used. Artificial tears or eye washes may ease discomfort. HOME CARE INSTRUCTIONS   To ease discomfort, apply a cool, clean washcloth to your eye for 10-20 minutes, 3-4 times a day.  Gently wipe away any drainage from your eye with a warm, wet washcloth or a cotton ball.  Wash your hands often with soap and water. Use paper towels to dry your hands.  Do not share towels or washcloths. This may spread the infection.  Change or wash your pillowcase every day.  You should not use eye makeup until the infection is gone.  Do not operate machinery or drive if your vision is blurred.  Stop using contact lenses. Ask your caregiver how to sterilize or replace your contacts before using them again. This depends on the type of contact lenses that you use.  When applying medicine to the infected eye, do not touch the edge of your eyelid with the eyedrop bottle or ointment tube. SEEK IMMEDIATE MEDICAL CARE IF:   Your infection has not improved within 3 days after beginning treatment.  You had yellow discharge from your eye and it returns.  You have increased eye pain.  Your eye redness is spreading.  Your vision becomes blurred.  You have a fever or persistent symptoms for more than 2-3 days.  You have a fever and your symptoms suddenly get worse.  You have facial pain, redness, or swelling. MAKE SURE YOU:   Understand these instructions.  Will watch your condition.  Will get  help right away if you are not doing well or get worse.   This information is not intended to replace advice given to you by your health care provider. Make sure you discuss any questions you have with your health care provider.   Document Released: 12/04/2005 Document Revised: 12/25/2014 Document Reviewed: 05/06/2012 Elsevier Interactive Patient Education [employer].

## 2016-04-08 NOTE — ED Notes (Signed)
C/O right eye irritation and swelling.  Wife noted small amount of yellow discharge in corner of eye today.

## 2016-04-29 ENCOUNTER — Emergency Department (HOSPITAL_COMMUNITY): Payer: PRIVATE HEALTH INSURANCE

## 2016-04-29 ENCOUNTER — Inpatient Hospital Stay (HOSPITAL_COMMUNITY)
Admission: EM | Admit: 2016-04-29 | Discharge: 2016-05-03 | DRG: 814 | Disposition: A | Discharge status: Home / Self Care | Payer: PRIVATE HEALTH INSURANCE | Attending: Internal Medicine | Admitting: Internal Medicine

## 2016-04-29 ENCOUNTER — Encounter (HOSPITAL_COMMUNITY): Payer: Self-pay | Admitting: Emergency Medicine

## 2016-04-29 DIAGNOSIS — Z8619 Personal history of other infectious and parasitic diseases: Secondary | ICD-10-CM | POA: Diagnosis not present

## 2016-04-29 DIAGNOSIS — D751 Secondary polycythemia: Secondary | ICD-10-CM | POA: Diagnosis not present

## 2016-04-29 DIAGNOSIS — N179 Acute kidney failure, unspecified: Secondary | ICD-10-CM | POA: Diagnosis present

## 2016-04-29 DIAGNOSIS — K219 Gastro-esophageal reflux disease without esophagitis: Secondary | ICD-10-CM | POA: Diagnosis present

## 2016-04-29 DIAGNOSIS — D45 Polycythemia vera: Secondary | ICD-10-CM | POA: Diagnosis present

## 2016-04-29 DIAGNOSIS — G4733 Obstructive sleep apnea (adult) (pediatric): Secondary | ICD-10-CM | POA: Diagnosis present

## 2016-04-29 DIAGNOSIS — I10 Essential (primary) hypertension: Secondary | ICD-10-CM | POA: Diagnosis present

## 2016-04-29 DIAGNOSIS — J310 Chronic rhinitis: Secondary | ICD-10-CM | POA: Diagnosis present

## 2016-04-29 DIAGNOSIS — R161 Splenomegaly, not elsewhere classified: Secondary | ICD-10-CM | POA: Diagnosis present

## 2016-04-29 DIAGNOSIS — Z800 Family history of malignant neoplasm of digestive organs: Secondary | ICD-10-CM

## 2016-04-29 DIAGNOSIS — Z7984 Long term (current) use of oral hypoglycemic drugs: Secondary | ICD-10-CM

## 2016-04-29 DIAGNOSIS — Z7982 Long term (current) use of aspirin: Secondary | ICD-10-CM | POA: Diagnosis not present

## 2016-04-29 DIAGNOSIS — H3530 Unspecified macular degeneration: Secondary | ICD-10-CM | POA: Diagnosis present

## 2016-04-29 DIAGNOSIS — Z79899 Other long term (current) drug therapy: Secondary | ICD-10-CM

## 2016-04-29 DIAGNOSIS — D72829 Elevated white blood cell count, unspecified: Secondary | ICD-10-CM | POA: Diagnosis present

## 2016-04-29 DIAGNOSIS — R1011 Right upper quadrant pain: Secondary | ICD-10-CM | POA: Diagnosis not present

## 2016-04-29 DIAGNOSIS — Z8249 Family history of ischemic heart disease and other diseases of the circulatory system: Secondary | ICD-10-CM | POA: Diagnosis not present

## 2016-04-29 DIAGNOSIS — D735 Infarction of spleen: Principal | ICD-10-CM | POA: Diagnosis present

## 2016-04-29 DIAGNOSIS — Z823 Family history of stroke: Secondary | ICD-10-CM

## 2016-04-29 DIAGNOSIS — K7460 Unspecified cirrhosis of liver: Secondary | ICD-10-CM | POA: Diagnosis not present

## 2016-04-29 DIAGNOSIS — Z833 Family history of diabetes mellitus: Secondary | ICD-10-CM | POA: Diagnosis not present

## 2016-04-29 DIAGNOSIS — E785 Hyperlipidemia, unspecified: Secondary | ICD-10-CM | POA: Diagnosis present

## 2016-04-29 DIAGNOSIS — Z6838 Body mass index (BMI) 38.0-38.9, adult: Secondary | ICD-10-CM

## 2016-04-29 DIAGNOSIS — Z96651 Presence of right artificial knee joint: Secondary | ICD-10-CM | POA: Diagnosis present

## 2016-04-29 DIAGNOSIS — E669 Obesity, unspecified: Secondary | ICD-10-CM | POA: Diagnosis present

## 2016-04-29 DIAGNOSIS — E119 Type 2 diabetes mellitus without complications: Secondary | ICD-10-CM

## 2016-04-29 DIAGNOSIS — I81 Portal vein thrombosis: Secondary | ICD-10-CM | POA: Diagnosis present

## 2016-04-29 DIAGNOSIS — I820 Budd-Chiari syndrome: Secondary | ICD-10-CM

## 2016-04-29 DIAGNOSIS — N289 Disorder of kidney and ureter, unspecified: Secondary | ICD-10-CM | POA: Diagnosis not present

## 2016-04-29 DIAGNOSIS — I82890 Acute embolism and thrombosis of other specified veins: Secondary | ICD-10-CM

## 2016-04-29 DIAGNOSIS — G4730 Sleep apnea, unspecified: Secondary | ICD-10-CM

## 2016-04-29 LAB — URINALYSIS, ROUTINE W REFLEX MICROSCOPIC
BILIRUBIN URINE: NEGATIVE
Glucose, UA: NEGATIVE mg/dL
Hgb urine dipstick: NEGATIVE
KETONES UR: NEGATIVE mg/dL
Leukocytes, UA: NEGATIVE
NITRITE: NEGATIVE
Protein, ur: NEGATIVE mg/dL
Specific Gravity, Urine: >1.046 — ABNORMAL HIGH (ref 1.005–1.030)
pH: 5 (ref 5.0–8.0)

## 2016-04-29 LAB — COMPREHENSIVE METABOLIC PANEL
ALK PHOS: 73 U/L (ref 38–126)
ALT: 30 U/L (ref 17–63)
ANION GAP: 11 (ref 5–15)
AST: 31 U/L (ref 15–41)
Albumin: 3.2 g/dL — ABNORMAL LOW (ref 3.5–5.0)
BILIRUBIN TOTAL: 0.6 mg/dL (ref 0.3–1.2)
BUN: 32 mg/dL — AB (ref 6–20)
CALCIUM: 9.8 mg/dL (ref 8.9–10.3)
CO2: 22 mmol/L (ref 22–32)
Chloride: 103 mmol/L (ref 101–111)
Creatinine, Ser: 1.76 mg/dL — ABNORMAL HIGH (ref 0.61–1.24)
GFR calc Af Amer: 43 mL/min — ABNORMAL LOW (ref >60)
GFR calc non Af Amer: 37 mL/min — ABNORMAL LOW (ref >60)
GLUCOSE: 189 mg/dL — AB (ref 65–99)
Potassium: 4.1 mmol/L (ref 3.5–5.1)
SODIUM: 136 mmol/L (ref 135–145)
TOTAL PROTEIN: 6.7 g/dL (ref 6.5–8.1)

## 2016-04-29 LAB — CBC
HCT: 43 % (ref 39.0–52.0)
Hemoglobin: 13 g/dL (ref 13.0–17.0)
MCH: 20.8 pg — ABNORMAL LOW (ref 26.0–34.0)
MCHC: 30.2 g/dL (ref 30.0–36.0)
MCV: 68.7 fL — ABNORMAL LOW (ref 78.0–100.0)
PLATELETS: 342 10*3/uL (ref 150–400)
RBC: 6.26 MIL/uL — ABNORMAL HIGH (ref 4.22–5.81)
RDW: 19.7 % — AB (ref 11.5–15.5)
WBC: 14.9 10*3/uL — ABNORMAL HIGH (ref 4.0–10.5)

## 2016-04-29 LAB — LIPASE, BLOOD: LIPASE: 24 U/L (ref 11–51)

## 2016-04-29 LAB — PROTIME-INR
INR: 1.24 (ref 0.00–1.49)
PROTHROMBIN TIME: 15.7 s — AB (ref 11.6–15.2)

## 2016-04-29 LAB — APTT: APTT: 30 s (ref 24–37)

## 2016-04-29 LAB — GLUCOSE, CAPILLARY: Glucose-Capillary: 122 mg/dL — ABNORMAL HIGH (ref 65–99)

## 2016-04-29 MED ORDER — ACETAMINOPHEN 325 MG PO TABS: 650.0000 mg | ORAL_TABLET | Freq: Four times a day (QID) | ORAL | Status: DC | PRN

## 2016-04-29 MED ORDER — HEPARIN (PORCINE) IN NACL 100-0.45 UNIT/ML-% IJ SOLN
1500.0000 [IU]/h | INTRAMUSCULAR | Status: DC
  Administered 2016-04-29: 1300 [IU]/h via INTRAVENOUS
  Administered 2016-04-30: 1500 [IU]/h via INTRAVENOUS
  Filled 2016-04-29 (×2): qty 250

## 2016-04-29 MED ORDER — ONDANSETRON HCL 4 MG/2ML IJ SOLN: 4.0000 mg | Freq: Four times a day (QID) | INTRAMUSCULAR | Status: DC | PRN

## 2016-04-29 MED ORDER — SODIUM CHLORIDE 0.9 % IV SOLN
INTRAVENOUS | Status: DC
  Administered 2016-04-29 – 2016-05-03 (×7): via INTRAVENOUS

## 2016-04-29 MED ORDER — VERAPAMIL HCL ER 240 MG PO TBCR
240.0000 mg | EXTENDED_RELEASE_TABLET | Freq: Every day | ORAL | Status: DC
  Administered 2016-04-30 – 2016-05-03 (×3): 240 mg via ORAL
  Filled 2016-04-29 (×4): qty 1

## 2016-04-29 MED ORDER — PANTOPRAZOLE SODIUM 40 MG PO TBEC
40.0000 mg | DELAYED_RELEASE_TABLET | Freq: Every day | ORAL | Status: DC
  Administered 2016-04-29 – 2016-05-03 (×4): 40 mg via ORAL
  Filled 2016-04-29 (×5): qty 1

## 2016-04-29 MED ORDER — OXYCODONE HCL 5 MG PO TABS
5.0000 mg | ORAL_TABLET | ORAL | Status: DC | PRN
  Administered 2016-04-30 – 2016-05-02 (×7): 5 mg via ORAL
  Filled 2016-04-29 (×7): qty 1

## 2016-04-29 MED ORDER — ROSUVASTATIN CALCIUM 20 MG PO TABS
20.0000 mg | ORAL_TABLET | Freq: Every day | ORAL | Status: DC
  Administered 2016-04-30 – 2016-05-02 (×3): 20 mg via ORAL
  Filled 2016-04-29 (×4): qty 1

## 2016-04-29 MED ORDER — ACETAMINOPHEN 650 MG RE SUPP: 650.0000 mg | Freq: Four times a day (QID) | RECTAL | Status: DC | PRN

## 2016-04-29 MED ORDER — INSULIN ASPART 100 UNIT/ML ~~LOC~~ SOLN: 0.0000 [IU] | Freq: Every day | SUBCUTANEOUS | Status: DC

## 2016-04-29 MED ORDER — DESMOPRESSIN ACETATE 0.2 MG PO TABS
0.4000 mg | ORAL_TABLET | Freq: Every day | ORAL | Status: DC
  Administered 2016-04-30 – 2016-05-02 (×3): 0.4 mg via ORAL
  Filled 2016-04-29 (×4): qty 2

## 2016-04-29 MED ORDER — HEPARIN BOLUS VIA INFUSION
2300.0000 [IU] | Freq: Once | INTRAVENOUS | Status: AC
  Administered 2016-04-29: 2300 [IU] via INTRAVENOUS
  Filled 2016-04-29: qty 2300

## 2016-04-29 MED ORDER — ONDANSETRON HCL 4 MG PO TABS: 4.0000 mg | ORAL_TABLET | Freq: Four times a day (QID) | ORAL | Status: DC | PRN

## 2016-04-29 MED ORDER — IOPAMIDOL (ISOVUE-300) INJECTION 61%
100.0000 mL | Freq: Once | INTRAVENOUS | Status: AC | PRN
  Administered 2016-04-29: 80 mL via INTRAVENOUS

## 2016-04-29 MED ORDER — INSULIN ASPART 100 UNIT/ML ~~LOC~~ SOLN
0.0000 [IU] | Freq: Three times a day (TID) | SUBCUTANEOUS | Status: DC
  Administered 2016-04-30: 1 [IU] via SUBCUTANEOUS
  Administered 2016-05-02: 2 [IU] via SUBCUTANEOUS
  Administered 2016-05-02: 3 [IU] via SUBCUTANEOUS
  Administered 2016-05-03: 1 [IU] via SUBCUTANEOUS

## 2016-04-29 MED ORDER — SODIUM CHLORIDE 0.9 % IV BOLUS (SEPSIS)
1000.0000 mL | Freq: Once | INTRAVENOUS | Status: AC
  Administered 2016-04-29: 1000 mL via INTRAVENOUS

## 2016-04-29 MED ORDER — ONDANSETRON HCL 4 MG/2ML IJ SOLN
4.0000 mg | Freq: Once | INTRAMUSCULAR | Status: AC
  Administered 2016-04-29: 4 mg via INTRAVENOUS
  Filled 2016-04-29: qty 2

## 2016-04-29 MED ORDER — HYDROMORPHONE HCL 1 MG/ML IJ SOLN
0.5000 mg | INTRAMUSCULAR | Status: DC | PRN
  Administered 2016-04-29 – 2016-05-01 (×10): 1 mg via INTRAVENOUS
  Filled 2016-04-29 (×11): qty 1

## 2016-04-29 MED ORDER — SODIUM CHLORIDE 0.9% FLUSH
3.0000 mL | Freq: Two times a day (BID) | INTRAVENOUS | Status: DC
  Administered 2016-04-30 – 2016-05-02 (×2): 3 mL via INTRAVENOUS

## 2016-04-29 MED ORDER — SODIUM CHLORIDE 0.9 % IV SOLN
INTRAVENOUS | Status: DC
  Administered 2016-04-29 – 2016-04-30 (×2): via INTRAVENOUS

## 2016-04-29 NOTE — Progress Notes (Signed)
Pt to self administer once night time meds are administered.  RT to monitor and assess as needed.

## 2016-04-29 NOTE — ED Provider Notes (Signed)
Received care of patient from Dr. Eulis Foster. Please see his note for history, physical and prior care. Briefly this is a 70 year old male who presents with concern of one to 2 weeks of left upper quadrant pain. 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 Hospitalist was consulted for admission, patient was placed on a heparin drip. Admitted in stable condition.  Gareth Morgan, MD 05/01/16 [phone]

## 2016-04-29 NOTE — ED Notes (Addendum)
Pt reports midline lower abd pain for the past 2 weeks after holding in an episode of emesis. No changes in urination. No diarrhea. Has had nausea and abd bloating

## 2016-04-29 NOTE — Progress Notes (Signed)
ANTICOAGULATION CONSULT NOTE - Initial Consult  Pharmacy Consult for heparin Indication: DVT  No Known Allergies  Patient Measurements:   Heparin Dosing Weight:   Vital Signs: Temp: 98.8 F (37.1 C) (05/13 1600) Temp Source: Oral (05/13 1600) BP: 121/72 mmHg (05/13 1810) Pulse Rate: 79 (05/13 1810)  Labs:  Recent Labs  04/29/16 1608  HGB 13.0  HCT 43.0  PLT 342  CREATININE 1.76*    CrCl cannot be calculated (Unknown ideal weight.).   Medical History: Past Medical History  Diagnosis Date  . Obesity   . HTN (hypertension)   . DM (diabetes mellitus) ([place])   . GERD (gastroesophageal reflux disease)   . Hiatal hernia   . Insomnia     resolved by using CPAP  . Rhinitis   . Hyperlipidemia   . Situational depression   . Nocturia more than twice per night 11/11/2014    For 6 month, 5 nocturias a night.   . Sleep apnea     uses CPAP every night  . History of shingles 04/03/2014  . Polycythemia vera(238.4)     History  . Macular degeneration     Medications:  Scheduled:   Infusions:  . sodium chloride     Assessment: 70yo M presenting with lower abdominal pain. 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 Pharmacy is asked to start heparin infusion. CBC is wnl. SCr is elevated. No anticoagulants PTA. Takes low-dose aspirin.  Goal of Therapy:  Heparin level 0.3-0.7 units/ml Monitor platelets by anticoagulation protocol: Yes   Plan:  Draw baseline aPTT and PT/INR. Give heparin 2300units IV bolus then start infusion at 1320units/hr. Check heparin level in 8hrs. Check CBC q24h while on heparin. F/u daily.  Romeo Rabon, PharmD, pager [phone]. 04/29/2016,7:29 PM.

## 2016-04-29 NOTE — H&P (Addendum)
[facility] Admission History and Physical       [name]. AJO:[id number] DOB: July 23, 1946 DOA: 04/29/2016  Referring physician: EDP PCP: [name], MD  Specialists:   Chief Complaint: ABD Pain  HPI: Kendrick Haapala. is a 70 y.o. male with a history of Polycythemia Vera, HTN, DM2, and hyperlipidemia who presents to the ED with complaints of L> R UQ ABD for the past 2 weeks.  The pain has been worsening over the last week.  He denies any Nausea Vomiting or Diarrhea.   He also denies any fevers or chills.   The pain is sharp and intermittent, and he rates the pain at a 4/10 currently.      Review of Systems:  Constitutional: No Weight Loss, No Weight Gain, Night Sweats, Fevers, Chills, Dizziness, Light Headedness, Fatigue, or Generalized Weakness HEENT: No Headaches, Difficulty Swallowing,Tooth/Dental Problems,Sore Throat,  No Sneezing, Rhinitis, Ear Ache, Nasal Congestion, or Post Nasal Drip,  Cardio-vascular:  No Chest pain, Orthopnea, PND, Edema in Lower Extremities, Anasarca, Dizziness, Palpitations  Resp: No Dyspnea, No DOE, No Productive Cough, No Non-Productive Cough, No Hemoptysis, No Wheezing.    GI: No Heartburn, Indigestion, +Abdominal Pain, Nausea, Vomiting, Diarrhea, Constipation, Hematemesis, Hematochezia, Melena, Change in Bowel Habits,  Loss of Appetite  GU: No Dysuria, No Change in Color of Urine, No Urgency or Urinary Frequency, No Flank pain.  Musculoskeletal: No Joint Pain or Swelling, No Decreased Range of Motion, No Back Pain.  Neurologic: No Syncope, No Seizures, Muscle Weakness, Paresthesia, Vision Disturbance or Loss, No Diplopia, No Vertigo, No Difficulty Walking,  Skin: No Rash or Lesions. Psych: No Change in Mood or Affect, No Depression or Anxiety, No Memory loss, No Confusion, or Hallucinations   Past Medical History  Diagnosis Date  . Obesity   . HTN (hypertension)   . DM (diabetes mellitus) ([place])   . GERD (gastroesophageal reflux  disease)   . Hiatal hernia   . Insomnia     resolved by using CPAP  . Rhinitis   . Hyperlipidemia   . Situational depression   . Nocturia more than twice per night 11/11/2014    For 6 month, 5 nocturias a night.   . Sleep apnea     uses CPAP every night  . History of shingles 04/03/2014  . Polycythemia vera(238.4)     History  . Macular degeneration      Past Surgical History  Procedure Laterality Date  . Cardiac catheterization      greater 10 yrs ago, normal  . Total knee arthroplasty Right 2010  . Rotator cuff repair      right  . Carpal tunnel release      bilateral  . Tonsillectomy    . Tendon repair      left arm  . Wisdom tooth extraction    . Colonoscopy  06/2005    [facility] Dr Lajoyce Corners      Prior to Admission medications   Medication Sig Start Date End Date Taking? Authorizing Provider  aspirin EC 81 MG tablet Take 81 mg by mouth daily.   Yes Historical Provider, MD  [facility] B-12 500 MCG SUBL Place 1 tablet under the tongue daily.  09/17/14  Yes Historical Provider, MD  desmopressin (DDAVP) 0.2 MG tablet Take 0.4 mg by mouth at bedtime.  03/21/14  Yes Historical Provider, MD  glimepiride (AMARYL) 2 MG tablet Take 2-4 mg by mouth 2 (two) times daily. Take 18ms in the morning and 240m at night 03/21/14  Yes Historical Provider, MD  metFORMIN (GLUCOPHAGE-XR) 500 MG 24 hr tablet Take 500 mg by mouth daily with breakfast.   Yes Historical Provider, MD  omeprazole (PRILOSEC) 20 MG capsule Take 20 mg by mouth daily.     Yes Historical Provider, MD  promethazine (PHENERGAN) 25 MG tablet Take 25 mg by mouth 3 (three) times daily as needed for nausea or vomiting.   Yes Historical Provider, MD  rosuvastatin (CRESTOR) 20 MG tablet Take 20 mg by mouth daily.     Yes Historical Provider, MD  testosterone cypionate (DEPOTESTOTERONE CYPIONATE) 200 MG/ML injection Inject 200 mg into the muscle every 21 ( twenty-one) days.    Yes Leanna Battles, MD  tobramycin (TOBREX) 0.3 % ophthalmic  solution Place 1 drop into the right eye every 4 (four) hours. X 5 days Patient taking differently: Place 1 drop into the right eye every 4 (four) hours. Use 4 times daily the 2 days before, the day of, and the 2 days after eye injections 04/08/16  Yes Janne Napoleon, NP  UNKNOWN TO PATIENT Right eye injections every 2 months   Yes Historical Provider, MD  valsartan-hydrochlorothiazide (DIOVAN-HCT) 320-25 MG per tablet Take 1 tablet by mouth daily.  07/20/13  Yes Historical Provider, MD  verapamil (CALAN-SR) 240 MG CR tablet Take 240 mg by mouth daily.   Yes Historical Provider, MD  VIAGRA 100 MG tablet  10/26/14  Yes Historical Provider, MD  ONE TOUCH ULTRA TEST test strip by Other route as needed.  08/04/13   Historical Provider, MD     No Known Allergies  Social History:  reports that he has never smoked. He has never used smokeless tobacco. He reports that he drinks alcohol. He reports that he does not use illicit drugs.    Family History  Problem Relation Age of Onset  . Colon cancer    . Heart failure    . Heart attack    . Lung disease Father     history  . Diabetes Mother     history  . Stroke Mother     history  . Colon polyps Neg Hx   . Rectal cancer Neg Hx   . Stomach cancer Neg Hx   . Esophageal cancer Neg Hx        Physical Exam:  GEN:  Pleasant Well Nourished and Well Developed  70 y.o. Caucasian male examined and in no acute distress; cooperative with exam Filed Vitals:   04/29/16 1548 04/29/16 1600 04/29/16 1810  BP: 151/133 96/68 121/72  Pulse: 94 88 79  Temp: 98.4 F (36.9 C) 98.8 F (37.1 C)   TempSrc: Oral Oral   Resp: 16 16 14   SpO2: 91% 93% 100%   Blood pressure 121/72, pulse 79, temperature 98.8 F (37.1 C), temperature source Oral, resp. rate 14, SpO2 100 %. PSYCH: He is alert and oriented x4; does not appear anxious does not appear depressed; affect is normal HEENT: Normocephalic and Atraumatic, Mucous membranes pink; PERRLA; EOM intact; Fundi:   Benign;  No scleral icterus, Nares: Patent, Oropharynx: Clear, Fair Dentition,    Neck:  FROM, No Cervical Lymphadenopathy nor Thyromegaly or Carotid Bruit; No JVD; Breasts:: Not examined CHEST WALL: No tenderness CHEST: Normal respiration, clear to auscultation bilaterally HEART: Regular rate and rhythm; no murmurs rubs or gallops BACK: No kyphosis or scoliosis; No CVA tenderness ABDOMEN: Positive Bowel Sounds,MIldly Tender LUQ,  Soft, No Rebound or Guarding; No Masses, No Organomegaly Rectal Exam: Not done EXTREMITIES: No Cyanosis, Clubbing,  or Edema; No Ulcerations. Genitalia: not examined PULSES: 2+ and symmetric SKIN: Normal hydration no rash or ulceration CNS:  Alert and Oriented x 4, No Focal Deficits Vascular: pulses palpable throughout    Labs on Admission:  Basic Metabolic Panel:  Recent Labs Lab 04/29/16 1608  NA 136  K 4.1  CL 103  CO2 22  GLUCOSE 189*  BUN 32*  CREATININE 1.76*  CALCIUM 9.8   Liver Function Tests:  Recent Labs Lab 04/29/16 1608  AST 31  ALT 30  ALKPHOS 73  BILITOT 0.6  PROT 6.7  ALBUMIN 3.2*    Recent Labs Lab 04/29/16 1608  LIPASE 24   No results for input(s): AMMONIA in the last 168 hours. CBC:  Recent Labs Lab 04/29/16 1608  WBC 14.9*  HGB 13.0  HCT 43.0  MCV 68.7*  PLT 342   Cardiac Enzymes: No results for input(s): CKTOTAL, CKMB, CKMBINDEX, TROPONINI in the last 168 hours.  BNP (last 3 results) No results for input(s): BNP in the last 8760 hours.  ProBNP (last 3 results) No results for input(s): PROBNP in the last 8760 hours.  CBG: No results for input(s): GLUCAP in the last 168 hours.  Radiological Exams on Admission: Ct Abdomen Pelvis W Contrast  04/29/2016  CLINICAL DATA:  Left upper quadrant pain, history of polycythemia vera EXAM: CT ABDOMEN AND PELVIS WITH CONTRAST TECHNIQUE: Multidetector CT imaging of the abdomen and pelvis was performed using the standard protocol following bolus administration of  intravenous contrast. CONTRAST:  51m ISOVUE-300 IOPAMIDOL (ISOVUE-300) INJECTION 61% COMPARISON:  None. FINDINGS: Lung bases demonstrate some mild left lower lobe atelectasis as well as a small pleural effusion. These are likely reactive in nature. The liver is within normal limits. The gallbladder, adrenal glands and pancreas are within normal limits. Kidneys demonstrate bilateral renal cystic change worse on the left than the right. The largest of these cysts measures 7 cm in greatest dimension. No renal calculi or obstructive changes are seen. In the left upper quadrant, the spleen demonstrates mottled enhancement which persist on delayed imaging. These changes are consistent with splenic infarction. Additionally there are changes consistent with thrombosis of the splenic vein which appears to extend at least for short distance into the main portal vein. The superior mesenteric vein does not appear to be thrombosed. Contrast enhancement is somewhat limited due to the timing of the contrast bolus. Diverticular change of the colon is noted without definitive diverticulitis. Very mild ascites is noted predominately in the left pericolic gutter and surrounding the liver. This is likely reactive in nature to the changes in the spleen. Aortoiliac calcifications are noted. No aneurysmal dilatation is seen. The appendix is within normal limits. The bladder is well distended. No pelvic mass lesion is seen. No acute bony abnormality is noted. 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 Ultrasound evaluation may be helpful for further clarification of this thrombus as far as its extension into the intrahepatic branches of the portal vein. Diverticulosis without diverticulitis. Mild ascites likely reactive in nature. Left lower lobe changes as described likely reactive in nature. Critical Value/emergent results were called by telephone at  the time of interpretation on 04/29/2016 at 6:13 pm to Dr. EDaleen Bo, who verbally acknowledged these results. Electronically Signed   By: MInez CatalinaM.D.   On: 04/29/2016 18:13     EKG: Independently reviewed.    Assessment/Plan:   70y.o. male with  Principal Problem:  Portal vein thrombosis/Splenic infarction-   ? Budd Chiari Syndrome ( ? JAK2 Mutation)   IV Heparin Drip ordered   Consult Hematology in AM   IR Consult in AM for possible Thrombolysis if possible   Active Problems:    AKI (acute kidney injury) ([place])   Gentle IVFs      Polycythemia vera (HCC)   Diagnosed and followed by Dr Blair Dolphin      Essential hypertension   Continue Verapamil Rx   Hold ARB/HCTZ due to AKI      Diabetes mellitus (HCC)   Hold Metformin and Amaryl Rx   SSI coverage PRN   Check HbA1C in AM      Leukocytosis   Monitor Trend    OSA   CPAP qhs     DVT Prophylaxis    IV Heparin Drip      Code Status:     FULL CODE     Family Communication:   Wife at Bedside  Disposition Plan:    Inpatient Status        Time spent:  [facility] Pager [phone]   If [place] Please Contact the Day Rounding Team MD for [facility]  If 7PM-7AM, Please Contact Night-Floor Coverage  [email or web address] Password [email or web address] 04/29/2016, 7:11 PM     ADDENDUM:   Patient was seen and examined on 04/29/2016

## 2016-04-29 NOTE — ED Provider Notes (Signed)
CSN: [id number]     Arrival date & time 04/29/16  70 History   First MD Initiated Contact with Patient 04/29/16 1554     Chief Complaint  Patient presents with  . Abdominal Pain     (Consider location/radiation/quality/duration/timing/severity/associated sxs/prior Treatment) HPI  James Macha. is a 70 y.o. male who presents for ongoing lower abdominal pain, for 2 weeks after an episode of retching, with dry heaves. Pain is low-grade, and ongoing and was aggravated this week, by stress at his office. The pain worsened today, so he came here for evaluation. The pain is worse with ambulation, and causes him to have decreased appetite. No vomiting today or yesterday. He is stooling normal throughout this episode. No fever, chills, cough, shortness of breath, chest pain or paresthesias. He did feel weak today, when he was driving his car. There are no other known modifying factors.     Past Medical History  Diagnosis Date  . Obesity   . HTN (hypertension)   . DM (diabetes mellitus) ([place])   . GERD (gastroesophageal reflux disease)   . Hiatal hernia   . Insomnia     resolved by using CPAP  . Rhinitis   . Hyperlipidemia   . Situational depression   . Nocturia more than twice per night 11/11/2014    For 6 month, 5 nocturias a night.   . Sleep apnea     uses CPAP every night  . History of shingles 04/03/2014  . Polycythemia vera(238.4)     History  . Macular degeneration    Past Surgical History  Procedure Laterality Date  . Cardiac catheterization      greater 10 yrs ago, normal  . Total knee arthroplasty Right 2010  . Rotator cuff repair      right  . Carpal tunnel release      bilateral  . Tonsillectomy    . Tendon repair      left arm  . Wisdom tooth extraction    . Colonoscopy  06/2005    [facility] Dr Lajoyce Corners   Family History  Problem Relation Age of Onset  . Colon cancer    . Heart failure    . Heart attack    . Lung disease Father     history  . Diabetes  Mother     history  . Stroke Mother     history  . Colon polyps Neg Hx   . Rectal cancer Neg Hx   . Stomach cancer Neg Hx   . Esophageal cancer Neg Hx    Social History  Substance Use Topics  . Smoking status: Never Smoker   . Smokeless tobacco: Never Used     Comment: never used tobacco  . Alcohol Use: 0.0 oz/week    0 Standard drinks or equivalent per week     Comment: rarely     Review of Systems  All other systems reviewed and are negative.     Allergies  Review of patient's allergies indicates no known allergies.  Home Medications   Prior to Admission medications   Medication Sig Start Date End Date Taking? Authorizing Provider  aspirin EC 81 MG tablet Take 81 mg by mouth daily.    Historical Provider, MD  [facility] B-12 500 MCG SUBL  09/17/14   Historical Provider, MD  desmopressin (DDAVP) 0.2 MG tablet Take 0.2 mg by mouth at bedtime.  03/21/14   Historical Provider, MD  glimepiride (AMARYL) 2 MG tablet Take 2 mg by  mouth. Pt not sure how he takes them 03/21/14   Historical Provider, MD  metFORMIN (GLUCOPHAGE) 500 MG tablet Take 1,000 mg by mouth 2 (two) times daily with a meal.    Historical Provider, MD  metoprolol succinate (TOPROL-XL) 100 MG 24 hr tablet Take 100 mg by mouth daily. 11/12/15   Historical Provider, MD  omeprazole (PRILOSEC) 20 MG capsule Take 20 mg by mouth daily.      Historical Provider, MD  ONE TOUCH ULTRA TEST test strip by Other route as needed.  08/04/13   Historical Provider, MD  rosuvastatin (CRESTOR) 20 MG tablet Take 20 mg by mouth daily.      Historical Provider, MD  testosterone cypionate (DEPOTESTOTERONE CYPIONATE) 200 MG/ML injection Inject into the muscle every 21 ( twenty-one) days.      Leanna Battles, MD  tobramycin (TOBREX) 0.3 % ophthalmic solution Place 1 drop into the right eye every 4 (four) hours. X 5 days 04/08/16   Janne Napoleon, NP  UNKNOWN TO PATIENT Right eye injections every 2 months    Historical Provider, MD   valsartan-hydrochlorothiazide (DIOVAN-HCT) 320-25 MG per tablet Take 1 tablet by mouth daily.  07/20/13   Historical Provider, MD  VIAGRA 100 MG tablet  10/26/14   Historical Provider, MD   BP 96/68 mmHg  Pulse 88  Temp(Src) 98.8 F (37.1 C) (Oral)  Resp 16  SpO2 93% Physical Exam  Constitutional: He is oriented to person, place, and time. He appears well-developed. He appears distressed (He is uncomfortable).  HENT:  Head: Normocephalic and atraumatic.  Right Ear: External ear normal.  Left Ear: External ear normal.  Eyes: Conjunctivae and EOM are normal. Pupils are equal, round, and reactive to light.  Neck: Normal range of motion and phonation normal. Neck supple.  Cardiovascular: Normal rate, regular rhythm and normal heart sounds.   Pulmonary/Chest: Effort normal and breath sounds normal. He exhibits no bony tenderness.  Abdominal: Soft. He exhibits distension. He exhibits no mass. There is tenderness (Lower bilateral, mild). There is no rebound and no guarding.  Musculoskeletal: Normal range of motion.  Neurological: He is alert and oriented to person, place, and time. No cranial nerve deficit or sensory deficit. He exhibits normal muscle tone. Coordination normal.  Skin: Skin is warm, dry and intact.  Psychiatric: He has a normal mood and affect. His behavior is normal. Judgment and thought content normal.  Nursing note and vitals reviewed.   ED Course  Procedures (including critical care time)  Initial clinical impression- nonspecific lower abdominal pain following an episode of retching, possibly consistent with internal hernia versus unspecified Intestinal disorder. He is stooling normally, but having ongoing nausea with decreased oral intake, and now hypotension. He'll be treated with fluids, screening blood work, and CT abdomen for source of discomfort.  Medications  sodium chloride 0.9 % bolus 1,000 mL (not administered)  0.9 %  sodium chloride infusion (not  administered)  ondansetron (ZOFRAN) injection 4 mg (not administered)    Patient Vitals for the past 24 hrs:  BP Temp Temp src Pulse Resp SpO2  04/29/16 1600 96/68 mmHg 98.8 F (37.1 C) Oral 88 16 93 %  04/29/16 1548 (!) 151/133 mmHg 98.4 F (36.9 C) Oral 94 16 91 %      Labs Review Labs Reviewed  COMPREHENSIVE METABOLIC PANEL - Abnormal; Notable for the following:    Glucose, Bld 189 (*)    BUN 32 (*)    Creatinine, Ser 1.76 (*)    Albumin 3.2 (*)  GFR calc non Af Amer 37 (*)    GFR calc Af Amer 43 (*)    All other components within normal limits  CBC - Abnormal; Notable for the following:    WBC 14.9 (*)    RBC 6.26 (*)    MCV 68.7 (*)    MCH 20.8 (*)    RDW 19.7 (*)    All other components within normal limits  URINALYSIS, ROUTINE W REFLEX MICROSCOPIC (NOT AT [facility]) - Abnormal; Notable for the following:    Color, Urine AMBER (*)    Specific Gravity, Urine >1.046 (*)    All other components within normal limits  PROTIME-INR - Abnormal; Notable for the following:    Prothrombin Time 15.7 (*)    All other components within normal limits  HEPARIN LEVEL (UNFRACTIONATED) - Abnormal; Notable for the following:    Heparin Unfractionated <0.10 (*)    All other components within normal limits  BASIC METABOLIC PANEL - Abnormal; Notable for the following:    Sodium 134 (*)    Glucose, Bld 123 (*)    BUN 34 (*)    Creatinine, Ser 1.83 (*)    GFR calc non Af Amer 35 (*)    GFR calc Af Amer 41 (*)    All other components within normal limits  CBC - Abnormal; Notable for the following:    WBC 13.3 (*)    Hemoglobin 11.7 (*)    MCV 69.3 (*)    MCH 20.3 (*)    MCHC 29.3 (*)    RDW 19.9 (*)    All other components within normal limits  GLUCOSE, CAPILLARY - Abnormal; Notable for the following:    Glucose-Capillary 122 (*)    All other components within normal limits  LIPASE, BLOOD  APTT  GLUCOSE, CAPILLARY  HEMOGLOBIN A1C  HEPARIN LEVEL (UNFRACTIONATED)    Imaging  Review Ct Abdomen Pelvis W Contrast  04/29/2016  CLINICAL DATA:  Left upper quadrant pain, history of polycythemia vera EXAM: CT ABDOMEN AND PELVIS WITH CONTRAST TECHNIQUE: Multidetector CT imaging of the abdomen and pelvis was performed using the standard protocol following bolus administration of intravenous contrast. CONTRAST:  72m ISOVUE-300 IOPAMIDOL (ISOVUE-300) INJECTION 61% COMPARISON:  None. FINDINGS: Lung bases demonstrate some mild left lower lobe atelectasis as well as a small pleural effusion. These are likely reactive in nature. The liver is within normal limits. The gallbladder, adrenal glands and pancreas are within normal limits. Kidneys demonstrate bilateral renal cystic change worse on the left than the right. The largest of these cysts measures 7 cm in greatest dimension. No renal calculi or obstructive changes are seen. In the left upper quadrant, the spleen demonstrates mottled enhancement which persist on delayed imaging. These changes are consistent with splenic infarction. Additionally there are changes consistent with thrombosis of the splenic vein which appears to extend at least for short distance into the main portal vein. The superior mesenteric vein does not appear to be thrombosed. Contrast enhancement is somewhat limited due to the timing of the contrast bolus. Diverticular change of the colon is noted without definitive diverticulitis. Very mild ascites is noted predominately in the left pericolic gutter and surrounding the liver. This is likely reactive in nature to the changes in the spleen. Aortoiliac calcifications are noted. No aneurysmal dilatation is seen. The appendix is within normal limits. The bladder is well distended. No pelvic mass lesion is seen. No acute bony abnormality is noted. IMPRESSION: Changes consistent with splenic infarction and apparent splenic  vein thrombosis and partial portal venous thrombosis. Ultrasound evaluation may be helpful for further  clarification of this thrombus as far as its extension into the intrahepatic branches of the portal vein. Diverticulosis without diverticulitis. Mild ascites likely reactive in nature. Left lower lobe changes as described likely reactive in nature. Critical Value/emergent results were called by telephone at the time of interpretation on 04/29/2016 at 6:13 pm to Dr. Daleen Bo , who verbally acknowledged these results. Electronically Signed   By: Inez Catalina M.D.   On: 04/29/2016 18:13   I have personally reviewed and evaluated these images and lab results as part of my medical decision-making.   EKG Interpretation None      MDM   Final diagnoses:  Splenic vein thrombosis    Specific abdominal pain, ongoing, subacute, with decreased appetite. Evaluation with CT imaging required. Patient initially hemodynamically stable in the emergency department.   Nursing Notes Reviewed/ Care Coordinated, and agree without changes. Applicable Imaging Reviewed.  Interpretation of Laboratory Data incorporated into ED treatment   Plan- disposition, admission, transferred to Dr. Billy Fischer, at time of my departure; who will arrange for admission, and required immediate care.    Daleen Bo, MD 04/30/16 [phone]

## 2016-04-29 NOTE — ED Notes (Signed)
Patient has finished oral contrast

## 2016-04-30 LAB — GLUCOSE, CAPILLARY
GLUCOSE-CAPILLARY: 106 mg/dL — AB (ref 65–99)
GLUCOSE-CAPILLARY: 124 mg/dL — AB (ref 65–99)
Glucose-Capillary: 98 mg/dL (ref 65–99)
Glucose-Capillary: 142 mg/dL — ABNORMAL HIGH (ref 65–99)

## 2016-04-30 LAB — BASIC METABOLIC PANEL
Anion gap: 10 (ref 5–15)
BUN: 34 mg/dL — AB (ref 6–20)
CO2: 22 mmol/L (ref 22–32)
CREATININE: 1.83 mg/dL — AB (ref 0.61–1.24)
Calcium: 9.4 mg/dL (ref 8.9–10.3)
Chloride: 102 mmol/L (ref 101–111)
GFR calc Af Amer: 41 mL/min — ABNORMAL LOW (ref >60)
GFR, EST NON AFRICAN AMERICAN: 35 mL/min — AB (ref >60)
Glucose, Bld: 123 mg/dL — ABNORMAL HIGH (ref 65–99)
POTASSIUM: 3.9 mmol/L (ref 3.5–5.1)
Sodium: 134 mmol/L — ABNORMAL LOW (ref 135–145)

## 2016-04-30 LAB — CBC
HEMATOCRIT: 40 % (ref 39.0–52.0)
Hemoglobin: 11.7 g/dL — ABNORMAL LOW (ref 13.0–17.0)
MCH: 20.3 pg — ABNORMAL LOW (ref 26.0–34.0)
MCHC: 29.3 g/dL — ABNORMAL LOW (ref 30.0–36.0)
MCV: 69.3 fL — ABNORMAL LOW (ref 78.0–100.0)
PLATELETS: 307 10*3/uL (ref 150–400)
RBC: 5.77 MIL/uL (ref 4.22–5.81)
RDW: 19.9 % — AB (ref 11.5–15.5)
WBC: 13.3 10*3/uL — AB (ref 4.0–10.5)

## 2016-04-30 LAB — HEPARIN LEVEL (UNFRACTIONATED)

## 2016-04-30 MED ORDER — HEPARIN BOLUS VIA INFUSION
2000.0000 [IU] | Freq: Once | INTRAVENOUS | Status: AC
  Administered 2016-04-30: 2000 [IU] via INTRAVENOUS
  Filled 2016-04-30: qty 2000

## 2016-04-30 MED ORDER — HEPARIN (PORCINE) IN NACL 100-0.45 UNIT/ML-% IJ SOLN
2050.0000 [IU]/h | INTRAMUSCULAR | Status: DC
  Administered 2016-04-30: 1850 [IU]/h via INTRAVENOUS
  Administered 2016-05-01: 2050 [IU]/h via INTRAVENOUS
  Filled 2016-04-30 (×5): qty 250

## 2016-04-30 MED ORDER — HEPARIN BOLUS VIA INFUSION
2500.0000 [IU] | Freq: Once | INTRAVENOUS | Status: AC
  Administered 2016-04-30: 2500 [IU] via INTRAVENOUS
  Filled 2016-04-30: qty 2500

## 2016-04-30 NOTE — Progress Notes (Signed)
ANTICOAGULATION CONSULT NOTE - Follow Up Consult  Pharmacy Consult for Heparin Indication: DVT  No Known Allergies  Patient Measurements: Height: 5' 6"  (167.6 cm) Weight: 240 lb (108.863 kg) IBW/kg (Calculated) : 63.8 Heparin Dosing Weight: 88 kg  Vital Signs: Temp: 99 F (37.2 C) (05/14 0530) Temp Source: Oral (05/14 0530) BP: 119/62 mmHg (05/14 0530) Pulse Rate: 85 (05/14 0530)  Labs:  Recent Labs  04/29/16 1608 04/29/16 1920 04/30/16 0411  HGB 13.0  --  11.7*  HCT 43.0  --  40.0  PLT 342  --  307  APTT  --  30  --   LABPROT  --  15.7*  --   INR  --  1.24  --   HEPARINUNFRC  --   --  <0.10*  CREATININE 1.76*  --  1.83*    Estimated Creatinine Clearance: 42.8 mL/min (by C-G formula based on Cr of 1.83).   Medications:  Infusions:  . sodium chloride 125 mL/hr at 04/29/16 1744  . sodium chloride 50 mL/hr at 04/29/16 2100  . heparin 1,500 Units/hr (04/30/16 1135)    Assessment: 70yo M presenting 5/13 with lower abdominal pain. 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 Pharmacy is asked to start heparin infusion.  No anticoagulants PTA, but takes low-dose aspirin.  Today, 04/30/2016: Heparin level: < 0.1, subtherapeutic on heparin at 15 mL/hr CBC: Hgb decreased to 11.7, Plt remain WNL at 307 No bleeding or complications reported. SCr 1.83, elevated and increasing   Goal of Therapy:  Heparin level 0.3-0.7 units/ml Monitor platelets by anticoagulation protocol: Yes   Plan:   Give heparin 2500 units bolus IV x 1  Increase to heparin IV infusion at 1850 units/hr  Heparin level 8 hours after rate change  Daily heparin level and CBC  Continue to monitor H&H and platelets   Gretta Arab PharmD, BCPS Pager [phone] 04/30/2016 3:52 PM

## 2016-04-30 NOTE — Progress Notes (Signed)
ANTICOAGULATION CONSULT NOTE - Follow Up Consult  Pharmacy Consult for Heparin Indication: DVT  No Known Allergies  Patient Measurements: Height: 5' 6"  (167.6 cm) Weight: 240 lb (108.863 kg) IBW/kg (Calculated) : 63.8 Heparin Dosing Weight:   Vital Signs: Temp: 98.3 F (36.8 C) (05/14 0140) Temp Source: Oral (05/14 0140) BP: 113/64 mmHg (05/14 0140) Pulse Rate: 83 (05/14 0140)  Labs:  Recent Labs  04/29/16 1608 04/29/16 1920 04/30/16 0411  HGB 13.0  --  11.7*  HCT 43.0  --  40.0  PLT 342  --  307  APTT  --  30  --   LABPROT  --  15.7*  --   INR  --  1.24  --   HEPARINUNFRC  --   --  <0.10*  CREATININE 1.76*  --  1.83*    Estimated Creatinine Clearance: 42.8 mL/min (by C-G formula based on Cr of 1.83).   Medications:  Infusions:  . sodium chloride 125 mL/hr at 04/29/16 1744  . sodium chloride 50 mL/hr at 04/29/16 2100  . heparin 1,300 Units/hr (04/29/16 2009)    Assessment: Patient with low heparin level.  No heparin issues per RN.  Goal of Therapy:  Heparin level 0.3-0.7 units/ml Monitor platelets by anticoagulation protocol: Yes   Plan:  Heparin bolus 2000 units iv x1 Increase Heparin drip to 1500  units/hr  Next heparin level at 1400     [address], [place] 04/30/2016,5:21 AM

## 2016-04-30 NOTE — Progress Notes (Signed)
PROGRESS NOTE    James [name].  XTK:[id number] DOB: [birth year] DOA: 04/29/2016 PCP: [name], MD  Outpatient Specialists: Dr [name] Hematology oncology    Brief Narrative: 70 year old gentleman with h/o of PCV admitted for portal vein thrombosis.    Assessment & Plan:   Principal Problem:   Portal vein thrombosis Active Problems:   Polycythemia vera (HCC)   Essential hypertension   Splenic infarction   Diabetes mellitus (HCC)   Leukocytosis   AKI (acute kidney injury) ([place])   Sleep apnea    Portal vein thrombosis: Splenic infarction: Started on IV heparin. Hematology consulted.    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    DM: CBG (last 3)   Recent Labs  04/30/16 0745 04/30/16 1128 04/30/16 1627  GLUCAP 98 142* 106*    Resume SSI.   HYPERTENSION: Controlled.    Hyperlipidemia: - resume crestor.    DVT prophylaxis: IV heparin.  Code Status: (Full) Family Communication: wife at bedside Disposition Plan: pending further management.    Consultants:   Hematology. Dr [name]   Procedures: none   Antimicrobials:none   Subjective: Pain is better.   Objective: Filed Vitals:   04/29/16 2229 04/30/16 0140 04/30/16 0530 04/30/16 1345  BP: 90/65 113/64 119/62 108/60  Pulse: 79 83 85 84  Temp: 98.5 F (36.9 C) 98.3 F (36.8 C) 99 F (37.2 C) 99.7 F (37.6 C)  TempSrc: Oral Oral Oral Oral  Resp: 18 20 18 20   Height:      Weight:      SpO2: 96% 99% 92% 95%    Intake/Output Summary (Last 24 hours) at 04/30/16 1648 Last data filed at 04/30/16 1500  Gross per 24 hour  Intake 1995.35 ml  Output   1252 ml  Net 743.35 ml   Filed Weights   04/29/16 1944  Weight: 108.863 kg (240 lb)    Examination:  General exam: Appears calm and comfortable  Respiratory system: Clear to auscultation. Respiratory effort normal. Cardiovascular system: S1 & S2 heard, RRR. No JVD, murmurs, rubs, gallops or clicks. No pedal edema. Gastrointestinal  system: Abdomen obese, tender generalized.bowel sounds heard.  Central nervous system: Alert and oriented. No focal neurological deficits. Extremities: Symmetric 5 x 5 power. Skin: No rashes, lesions or ulcers Psychiatry: Judgement and insight appear normal. Mood & affect appropriate.     Data Reviewed: I have personally reviewed following labs and imaging studies  CBC:  Recent Labs Lab 04/29/16 1608 04/30/16 0411  WBC 14.9* 13.3*  HGB 13.0 11.7*  HCT 43.0 40.0  MCV 68.7* 69.3*  PLT 342 992   Basic Metabolic Panel:  Recent Labs Lab 04/29/16 1608 04/30/16 0411  NA 136 134*  K 4.1 3.9  CL 103 102  CO2 22 22  GLUCOSE 189* 123*  BUN 32* 34*  CREATININE 1.76* 1.83*  CALCIUM 9.8 9.4   GFR: Estimated Creatinine Clearance: 42.8 mL/min (by C-G formula based on Cr of 1.83). Liver Function Tests:  Recent Labs Lab 04/29/16 1608  AST 31  ALT 30  ALKPHOS 73  BILITOT 0.6  PROT 6.7  ALBUMIN 3.2*    Recent Labs Lab 04/29/16 1608  LIPASE 24   No results for input(s): AMMONIA in the last 168 hours. Coagulation Profile:  Recent Labs Lab 04/29/16 1920  INR 1.24   Cardiac Enzymes: No results for input(s): CKTOTAL, CKMB, CKMBINDEX, TROPONINI in the last 168 hours. BNP (last 3 results) No results for input(s): PROBNP in the last 8760 hours. HbA1C: No results  for input(s): HGBA1C in the last 72 hours. CBG:  Recent Labs Lab 04/29/16 2228 04/30/16 0745 04/30/16 1128 04/30/16 1627  GLUCAP 122* 98 142* 106*   Lipid Profile: No results for input(s): CHOL, HDL, LDLCALC, TRIG, CHOLHDL, LDLDIRECT in the last 72 hours. Thyroid Function Tests: No results for input(s): TSH, T4TOTAL, FREET4, T3FREE, THYROIDAB in the last 72 hours. Anemia Panel: No results for input(s): VITAMINB12, FOLATE, FERRITIN, TIBC, IRON, RETICCTPCT in the last 72 hours. Urine analysis:    Component Value Date/Time   COLORURINE AMBER* 04/29/2016 1855   APPEARANCEUR CLEAR 04/29/2016 1855    LABSPEC >1.046* 04/29/2016 1855   PHURINE 5.0 04/29/2016 1855   GLUCOSEU NEGATIVE 04/29/2016 1855   HGBUR NEGATIVE 04/29/2016 1855   BILIRUBINUR NEGATIVE 04/29/2016 1855   KETONESUR NEGATIVE 04/29/2016 1855   PROTEINUR NEGATIVE 04/29/2016 1855   UROBILINOGEN 0.2 02/02/2009 0805   NITRITE NEGATIVE 04/29/2016 1855   LEUKOCYTESUR NEGATIVE 04/29/2016 1855   Sepsis Labs: No results for input(s): PROCALCITON, LATICACIDVEN in the last 168 hours.  No results found for this or any previous visit (from the past 240 hour(s)).       Radiology Studies: Ct Abdomen Pelvis W Contrast  04/29/2016  CLINICAL DATA:  Left upper quadrant pain, history of polycythemia vera EXAM: CT ABDOMEN AND PELVIS WITH CONTRAST TECHNIQUE: Multidetector CT imaging of the abdomen and pelvis was performed using the standard protocol following bolus administration of intravenous contrast. CONTRAST:  39m ISOVUE-300 IOPAMIDOL (ISOVUE-300) INJECTION 61% COMPARISON:  None. FINDINGS: Lung bases demonstrate some mild left lower lobe atelectasis as well as a small pleural effusion. These are likely reactive in nature. The liver is within normal limits. The gallbladder, adrenal glands and pancreas are within normal limits. Kidneys demonstrate bilateral renal cystic change worse on the left than the right. The largest of these cysts measures 7 cm in greatest dimension. No renal calculi or obstructive changes are seen. In the left upper quadrant, the spleen demonstrates mottled enhancement which persist on delayed imaging. These changes are consistent with splenic infarction. Additionally there are changes consistent with thrombosis of the splenic vein which appears to extend at least for short distance into the main portal vein. The superior mesenteric vein does not appear to be thrombosed. Contrast enhancement is somewhat limited due to the timing of the contrast bolus. Diverticular change of the colon is noted without definitive  diverticulitis. Very mild ascites is noted predominately in the left pericolic gutter and surrounding the liver. This is likely reactive in nature to the changes in the spleen. Aortoiliac calcifications are noted. No aneurysmal dilatation is seen. The appendix is within normal limits. The bladder is well distended. No pelvic mass lesion is seen. No acute bony abnormality is noted. 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 Ultrasound evaluation may be helpful for further clarification of this thrombus as far as its extension into the intrahepatic branches of the portal vein. Diverticulosis without diverticulitis. Mild ascites likely reactive in nature. Left lower lobe changes as described likely reactive in nature. Critical Value/emergent results were called by telephone at the time of interpretation on 04/29/2016 at 6:13 pm to Dr. EDaleen Bo, who verbally acknowledged these results. Electronically Signed   By: MInez CatalinaM.D.   On: 04/29/2016 18:13        Scheduled Meds: . desmopressin  0.4 mg Oral QHS  . insulin aspart  0-5 Units Subcutaneous QHS  . insulin aspart  0-9 Units  Subcutaneous TID WC  . pantoprazole  40 mg Oral Daily  . rosuvastatin  20 mg Oral Daily  . sodium chloride flush  3 mL Intravenous Q12H  . verapamil  240 mg Oral Daily   Continuous Infusions: . sodium chloride 125 mL/hr at 04/29/16 1744  . sodium chloride 50 mL/hr at 04/30/16 1635  . heparin 1,850 Units/hr (04/30/16 1632)     LOS: 1 day    Time spent: 30 minutes.     Hosie Poisson, MD [facility] Pager [phone]  If 7PM-7AM, please contact night-coverage [email or web address] Password [facility] 04/30/2016, 4:48 PM

## 2016-05-01 ENCOUNTER — Inpatient Hospital Stay (HOSPITAL_COMMUNITY): Payer: PRIVATE HEALTH INSURANCE

## 2016-05-01 ENCOUNTER — Encounter (HOSPITAL_COMMUNITY): Payer: Self-pay | Admitting: *Deleted

## 2016-05-01 DIAGNOSIS — D751 Secondary polycythemia: Secondary | ICD-10-CM

## 2016-05-01 DIAGNOSIS — E119 Type 2 diabetes mellitus without complications: Secondary | ICD-10-CM

## 2016-05-01 DIAGNOSIS — I81 Portal vein thrombosis: Secondary | ICD-10-CM

## 2016-05-01 LAB — GLUCOSE, CAPILLARY
GLUCOSE-CAPILLARY: 90 mg/dL (ref 65–99)
GLUCOSE-CAPILLARY: 105 mg/dL — AB (ref 65–99)
GLUCOSE-CAPILLARY: 90 mg/dL (ref 65–99)
GLUCOSE-CAPILLARY: 147 mg/dL — AB (ref 65–99)

## 2016-05-01 LAB — HEPARIN LEVEL (UNFRACTIONATED)
HEPARIN UNFRACTIONATED: 0.17 [IU]/mL — AB (ref 0.30–0.70)
Heparin Unfractionated: <0.1 IU/mL — ABNORMAL LOW (ref 0.30–0.70)
Heparin Unfractionated: 0.17 IU/mL — ABNORMAL LOW (ref 0.30–0.70)

## 2016-05-01 LAB — CBC
HCT: 34.4 % — ABNORMAL LOW (ref 39.0–52.0)
Hemoglobin: 10.3 g/dL — ABNORMAL LOW (ref 13.0–17.0)
MCH: 20.7 pg — ABNORMAL LOW (ref 26.0–34.0)
MCHC: 29.9 g/dL — AB (ref 30.0–36.0)
MCV: 69.2 fL — ABNORMAL LOW (ref 78.0–100.0)
PLATELETS: 343 10*3/uL (ref 150–400)
RBC: 4.97 MIL/uL (ref 4.22–5.81)
RDW: 19.9 % — AB (ref 11.5–15.5)
WBC: 11.8 10*3/uL — AB (ref 4.0–10.5)

## 2016-05-01 LAB — HEMOGLOBIN A1C
HEMOGLOBIN A1C: 7.5 % — AB (ref 4.8–5.6)
MEAN PLASMA GLUCOSE: 169 mg/dL

## 2016-05-01 MED ORDER — HEPARIN BOLUS VIA INFUSION
2000.0000 [IU] | Freq: Once | INTRAVENOUS | Status: AC
  Administered 2016-05-01: 2000 [IU] via INTRAVENOUS
  Filled 2016-05-01: qty 2000

## 2016-05-01 MED ORDER — HEPARIN (PORCINE) IN NACL 100-0.45 UNIT/ML-% IJ SOLN
2500.0000 [IU]/h | INTRAMUSCULAR | Status: DC
  Administered 2016-05-01 – 2016-05-02 (×3): 2250 [IU]/h via INTRAVENOUS
  Administered 2016-05-02: 2500 [IU]/h via INTRAVENOUS
  Filled 2016-05-01 (×7): qty 250

## 2016-05-01 NOTE — Care Management Note (Signed)
Case Management Note  Patient Details  Name: James [name]. MRN: [id number] Date of Birth: 1946/07/06  Subjective/Objective:  70 y/o m admitted w/portal vein thrombosis.From home.                  Action/Plan:d/c plan home.   Expected Discharge Date:                 Expected Discharge Plan:  Home/Self Care  In-House Referral:     Discharge planning Services  CM Consult  Post Acute Care Choice:    Choice offered to:     DME Arranged:    DME Agency:     HH Arranged:    HH Agency:     Status of Service:  In process, will continue to follow  Medicare Important Message Given:    Date Medicare IM Given:    Medicare IM give by:    Date Additional Medicare IM Given:    Additional Medicare Important Message give by:     If discussed at [facility] of [facility] Meetings, dates discussed:    Additional Comments:  Dessa Phi, RN 05/01/2016, 1:20 PM

## 2016-05-01 NOTE — Progress Notes (Signed)
PROGRESS NOTE    James [name].  [id number]:[id number] DOB: 05-04-1946 DOA: 04/29/2016 PCP: [name], MD  Outpatient Specialists: Dr [name] Hematology oncology    Brief Narrative: 70 year old gentleman with h/o of PCV admitted for portal vein thrombosis.    Assessment & Plan:   Principal Problem:   Portal vein thrombosis Active Problems:   Polycythemia vera (HCC)   Essential hypertension   Splenic infarction   Diabetes mellitus (HCC)   Leukocytosis   AKI (acute kidney injury) ([place])   Sleep apnea    Portal vein thrombosis: Splenic infarction: Started on IV heparin. Hematology consulted.  Recommended 4 days of IV heparin, plan to start pt on xarelto on Wednesday on discharge.    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 Monitor hemoglobin.    DM: CBG (last 3)   Recent Labs  05/01/16 0724 05/01/16 1141 05/01/16 1623  GLUCAP 90 105* 90    Resume SSI.   HYPERTENSION: Controlled.    Hyperlipidemia: - resume crestor.   Nausea, vomiting abd pain possibly from the portal vein thrombosis.  - US abdomen ordered.    DVT prophylaxis: IV heparin.  Code Status: (Full) Family Communication: wife at bedside Disposition Plan: pending further management.    Consultants:   Hematology. Dr [name]   Procedures: none   Antimicrobials:none   Subjective: Pain is better.   Objective: Filed Vitals:   04/30/16 2110 04/30/16 2202 05/01/16 0540 05/01/16 1455  BP:  119/58 104/55 113/73  Pulse:  76 78 82  Temp:  98.5 F (36.9 C) 98.7 F (37.1 C) 98.3 F (36.8 C)  TempSrc:  Oral Oral Oral  Resp: 18 18 18 20   Height:      Weight:      SpO2:  100% 100% 97%    Intake/Output Summary (Last 24 hours) at 05/01/16 1819 Last data filed at 05/01/16 1455  Gross per 24 hour  Intake 2069.5 ml  Output   1204 ml  Net  865.5 ml   Filed Weights   04/29/16 1944  Weight: 108.863 kg (240 lb)    Examination:  General exam: Appears calm and comfortable  Respiratory  system: Clear to auscultation. Respiratory effort normal. Cardiovascular system: S1 & S2 heard, RRR. No JVD, murmurs, rubs, gallops or clicks. No pedal edema. Gastrointestinal system: Abdomen obese, tender generalized.bowel sounds heard.  Central nervous system: Alert and oriented. No focal neurological deficits. Extremities: Symmetric 5 x 5 power. Skin: No rashes, lesions or ulcers Psychiatry: Judgement and insight appear normal. Mood & affect appropriate.     Data Reviewed: I have personally reviewed following labs and imaging studies  CBC:  Recent Labs Lab 04/29/16 1608 04/30/16 0411 05/01/16 0444  WBC 14.9* 13.3* 11.8*  HGB 13.0 11.7* 10.3*  HCT 43.0 40.0 34.4*  MCV 68.7* 69.3* 69.2*  PLT 342 307 627   Basic Metabolic Panel:  Recent Labs Lab 04/29/16 1608 04/30/16 0411  NA 136 134*  K 4.1 3.9  CL 103 102  CO2 22 22  GLUCOSE 189* 123*  BUN 32* 34*  CREATININE 1.76* 1.83*  CALCIUM 9.8 9.4   GFR: Estimated Creatinine Clearance: 42.8 mL/min (by C-G formula based on Cr of 1.83). Liver Function Tests:  Recent Labs Lab 04/29/16 1608  AST 31  ALT 30  ALKPHOS 73  BILITOT 0.6  PROT 6.7  ALBUMIN 3.2*    Recent Labs Lab 04/29/16 1608  LIPASE 24   No results for input(s): AMMONIA in the last 168 hours. Coagulation Profile:  Recent  Labs Lab 04/29/16 1920  INR 1.24   Cardiac Enzymes: No results for input(s): CKTOTAL, CKMB, CKMBINDEX, TROPONINI in the last 168 hours. BNP (last 3 results) No results for input(s): PROBNP in the last 8760 hours. HbA1C:  Recent Labs  04/30/16 0411  HGBA1C 7.5*   CBG:  Recent Labs Lab 04/30/16 1627 04/30/16 2159 05/01/16 0724 05/01/16 1141 05/01/16 1623  GLUCAP 106* 124* 90 105* 90   Lipid Profile: No results for input(s): CHOL, HDL, LDLCALC, TRIG, CHOLHDL, LDLDIRECT in the last 72 hours. Thyroid Function Tests: No results for input(s): TSH, T4TOTAL, FREET4, T3FREE, THYROIDAB in the last 72 hours. Anemia  Panel: No results for input(s): VITAMINB12, FOLATE, FERRITIN, TIBC, IRON, RETICCTPCT in the last 72 hours. Urine analysis:    Component Value Date/Time   COLORURINE AMBER* 04/29/2016 1855   APPEARANCEUR CLEAR 04/29/2016 1855   LABSPEC >1.046* 04/29/2016 1855   PHURINE 5.0 04/29/2016 1855   GLUCOSEU NEGATIVE 04/29/2016 1855   HGBUR NEGATIVE 04/29/2016 1855   BILIRUBINUR NEGATIVE 04/29/2016 1855   KETONESUR NEGATIVE 04/29/2016 1855   PROTEINUR NEGATIVE 04/29/2016 1855   UROBILINOGEN 0.2 02/02/2009 0805   NITRITE NEGATIVE 04/29/2016 1855   LEUKOCYTESUR NEGATIVE 04/29/2016 1855   Sepsis Labs: No results for input(s): PROCALCITON, LATICACIDVEN in the last 168 hours.  No results found for this or any previous visit (from the past 240 hour(s)).       Radiology Studies: [place] Art/ven Flow Abd Pelv Doppler  05/01/2016  CLINICAL DATA:  Evaluate portal vein thrombosis. EXAM: DUPLEX ULTRASOUND OF LIVER TECHNIQUE: Color and duplex Doppler ultrasound was performed to evaluate the hepatic in-flow and out-flow vessels. COMPARISON:  CT, 04/29/2016 FINDINGS: Portal Vein Velocities Main:  Occluded with thrombus. Right:  Occluded Left:  Occluded Hepatic Vein Velocities Right:  49.5 cm/sec Middle:  35.3 cm/sec Left:  24.8 cm/sec Hepatic Artery Velocity:  146.2 cm/sec Splenic Vein Velocity:  Occluded with thrombus Spleen enlarged with a volume of 846 mL. Hypoechoic areas in the spleen consistent with the infarcts noted on CT. Varices: None visualized. Ascites: Small amount ascites noted adjacent to the liver. Liver shows a coarsened echotexture and subtle surface irregularity. IMPRESSION: 1. Apparent thrombus occludes the portal vein including the central right and left branches. 2. Apparent thrombus occludes the splenic vein. 3. Splenomegaly with hypoechoic areas consistent with splenic infarcts noted on CT. 4. Coarsened liver echotexture and subtle surface irregularity. Consider cirrhosis. 5. Small amount  ascites. Electronically Signed   By: Lajean Manes M.D.   On: 05/01/2016 15:52        Scheduled Meds: . desmopressin  0.4 mg Oral QHS  . insulin aspart  0-5 Units Subcutaneous QHS  . insulin aspart  0-9 Units Subcutaneous TID WC  . pantoprazole  40 mg Oral Daily  . rosuvastatin  20 mg Oral Daily  . sodium chloride flush  3 mL Intravenous Q12H  . verapamil  240 mg Oral Daily   Continuous Infusions: . sodium chloride 125 mL/hr at 05/01/16 1344  . heparin 2,050 Units/hr (05/01/16 1457)     LOS: 2 days    Time spent: 30 minutes.     Hosie Poisson, MD [facility] Pager [phone]  If 7PM-7AM, please contact night-coverage [email or web address] Password [facility] 05/01/2016, 6:19 PM

## 2016-05-01 NOTE — Progress Notes (Signed)
ANTICOAGULATION CONSULT NOTE - Follow Up Consult  Pharmacy Consult for Heparin Indication: new splenic vein thrombosis and partial portal venous thrombosis  No Known Allergies  Patient Measurements: Height: 5' 6"  (167.6 cm) Weight: 240 lb (108.863 kg) IBW/kg (Calculated) : 63.8 Heparin Dosing Weight: 88 kg  Vital Signs: Temp: 98.3 F (36.8 C) (05/15 1455) Temp Source: Oral (05/15 1455) BP: 113/73 mmHg (05/15 1455) Pulse Rate: 82 (05/15 1455)  Labs:  Recent Labs  04/29/16 1608 04/29/16 1920  04/30/16 0411 04/30/16 1346 04/30/16 2359 05/01/16 0444 05/01/16 1002  HGB 13.0  --   --  11.7*  --   --  10.3*  --   HCT 43.0  --   --  40.0  --   --  34.4*  --   PLT 342  --   --  307  --   --  343  --   APTT  --  30  --   --   --   --   --   --   LABPROT  --  15.7*  --   --   --   --   --   --   INR  --  1.24  --   --   --   --   --   --   HEPARINUNFRC  --   --   < > <0.10* <0.10* 0.17*  --  <0.10*  CREATININE 1.76*  --   --  1.83*  --   --   --   --   < > = values in this interval not displayed.  Estimated Creatinine Clearance: 42.8 mL/min (by C-G formula based on Cr of 1.83).   Medications:  Infusions:  . sodium chloride 125 mL/hr at 05/01/16 1344  . heparin 2,050 Units/hr (05/01/16 1457)    Assessment: 70yo M presenting 5/13 with lower abdominal pain. 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 Pharmacy is asked to start heparin infusion.  No anticoagulants PTA, but takes low-dose aspirin.  Today, 05/01/2016: - heparin came back at 10AM undetectable.  RN reported that patient loss IV access at 0930. IV team was able to obtain new IV access ~11AM - hgb down slightly to 10.3, plt ok - no bleeding documented - Dr. [name] indicated that he would like to treat with heparin for 4 days and then transition to Xarelto or Eliquis (today is day #2 of heparin therapy)   Goal of Therapy:  Heparin level 0.3-0.7 units/ml Monitor platelets by  anticoagulation protocol: Yes   Plan:  - Increase heparin drip slightly to 2050 units/hr - check 8 hour heparin level - monitor for s/s bleeding  Dia Sitter, PharmD, BCPS 05/01/2016 3:47 PM

## 2016-05-01 NOTE — Progress Notes (Signed)
PHARMACIST - PHYSICIAN COMMUNICATION CONCERNING:  IV heparin   In brief, 67 yoM on IV heparin for splenic vein thrombosis and partial portal vein thrombosis.  Please seen note written earlier today by Dia Sitter,  PharmD for further details.  Heparin level tonight is increased but remains below therapeutic goal at 0.17 (goal 0.3-0.7).  No infusion issues or bleeding per RN.    RECOMMENDATION: Heparin bolus 2000 units, then increase to heparin infusion 2250 units/hr.  Recheck heparin level in 8 hours.    Ralene Bathe, PharmD, BCPS 05/01/2016, 8:29 PM  Pager: [phone]

## 2016-05-01 NOTE — Progress Notes (Signed)
ANTICOAGULATION CONSULT NOTE - Follow Up Consult  Pharmacy Consult for Heparin Indication: DVT  No Known Allergies  Patient Measurements: Height: 5' 6"  (167.6 cm) Weight: 240 lb (108.863 kg) IBW/kg (Calculated) : 63.8 Heparin Dosing Weight:   Vital Signs: Temp: 98.5 F (36.9 C) (05/14 2202) Temp Source: Oral (05/14 2202) BP: 119/58 mmHg (05/14 2202) Pulse Rate: 76 (05/14 2202)  Labs:  Recent Labs  04/29/16 1608 04/29/16 1920 04/30/16 0411 04/30/16 1346 04/30/16 2359  HGB 13.0  --  11.7*  --   --   HCT 43.0  --  40.0  --   --   PLT 342  --  307  --   --   APTT  --  30  --   --   --   LABPROT  --  15.7*  --   --   --   INR  --  1.24  --   --   --   HEPARINUNFRC  --   --  <0.10* <0.10* 0.17*  CREATININE 1.76*  --  1.83*  --   --     Estimated Creatinine Clearance: 42.8 mL/min (by C-G formula based on Cr of 1.83).   Medications:  Infusions:  . sodium chloride 125 mL/hr at 04/29/16 1744  . heparin 1,850 Units/hr (04/30/16 1632)    Assessment: Patient with low heparin level.  Per RN, prior noted issues with IV site and some leakage from IV site.  RN estimates 10cc of leakage.  Goal of Therapy:  Heparin level 0.3-0.7 units/ml Monitor platelets by anticoagulation protocol: Yes   Plan:  Increase heparin to 1950 units/hr Recheck level at 1000.  Tyler Deis, [place] 05/01/2016,1:16 AM

## 2016-05-01 NOTE — Consult Note (Signed)
Referral MD  Reason for Referral: Portal vein thrombosis. History of polycythemia vera   Chief Complaint  Patient presents with  . Abdominal Pain  : I threw up and had a lot of pain in my abdomen.  HPI: James [name] is well-known to me. He is a very nice 70 year old gentleman who I've been following for many years. 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 He's been getting phlebotomized to maintain his hematocrit below 45%. He is on baby aspirin.  We typically see him every 6 months. I think he was last phlebotomized back in December.  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  He was down the beach. He came back and began to have abdominal pain and vomiting. This continued to worsen. He had no leg swelling. He had no diarrhea. There is no bleeding. He had no fever. He had no cough.  He ultimately was admitted. He has CT scan of the abdomen. 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 He pontes have an ultrasound of the abdomen to better assess this.  He's been started on heparin.  He does take supplemental testosterone. He's been on this for years.  He does have diabetes.  When he was admitted, his hematocrit is 43%. His white cell count 14.9. His creatinine was 1.76. Over the week and the creatinine went up to 1.83.  He currently is on heparin. His abdomen feels better. He is eating better. He's not having any nausea or vomiting.  We are asked to see him because of the history of polycythemia.  Overall, his performance status is ECOG 1.  He does not smoke. He is still working. He does not have     Past Medical History  Diagnosis Date  . Obesity   . HTN (hypertension)   . DM (diabetes mellitus) ([place])   . GERD (gastroesophageal reflux disease)   . Hiatal hernia   . Insomnia     resolved by using CPAP  . Rhinitis   . Hyperlipidemia   . Situational depression   . Nocturia more than twice per  night 11/11/2014    For 6 month, 5 nocturias a night.   . Sleep apnea     uses CPAP every night  . History of shingles 04/03/2014  . Polycythemia vera(238.4)     History  . Macular degeneration   :  Past Surgical History  Procedure Laterality Date  . Cardiac catheterization      greater 10 yrs ago, normal  . Total knee arthroplasty Right 2010  . Rotator cuff repair      right  . Carpal tunnel release      bilateral  . Tonsillectomy    . Tendon repair      left arm  . Wisdom tooth extraction    . Colonoscopy  06/2005    [facility] Dr Lajoyce Corners  :   Current facility-administered medications:  .  0.9 %  sodium chloride infusion, , Intravenous, Continuous, Daleen Bo, MD, Last Rate: 125 mL/hr at 04/29/16 1744 .  acetaminophen (TYLENOL) tablet 650 mg, 650 mg, Oral, Q6H PRN **OR** acetaminophen (TYLENOL) suppository 650 mg, 650 mg, Rectal, Q6H PRN, Theressa Millard, MD .  desmopressin (DDAVP) tablet 0.4 mg, 0.4 mg, Oral, QHS, Harvette C Jenkins, MD, 0.4 mg at 04/30/16 2254 .  heparin ADULT infusion 100 units/mL (25000 units/250 mL), 1,950 Units/hr, Intravenous, Continuous, Hosie Poisson, MD, Last Rate: 19.5 mL/hr  at 05/01/16 0117, 1,950 Units/hr at 05/01/16 0117 .  HYDROmorphone (DILAUDID) injection 0.5-1 mg, 0.5-1 mg, Intravenous, Q3H PRN, Theressa Millard, MD, 1 mg at 05/01/16 0502 .  insulin aspart (novoLOG) injection 0-5 Units, 0-5 Units, Subcutaneous, QHS, Theressa Millard, MD, 0 Units at 04/29/16 2257 .  insulin aspart (novoLOG) injection 0-9 Units, 0-9 Units, Subcutaneous, TID WC, Theressa Millard, MD, 1 Units at 04/30/16 1203 .  ondansetron (ZOFRAN) tablet 4 mg, 4 mg, Oral, Q6H PRN **OR** ondansetron (ZOFRAN) injection 4 mg, 4 mg, Intravenous, Q6H PRN, Theressa Millard, MD .  oxyCODONE (Oxy IR/ROXICODONE) immediate release tablet 5 mg, 5 mg, Oral, Q4H PRN, Theressa Millard, MD, 5 mg at 04/30/16 1901 .  pantoprazole (PROTONIX) EC tablet 40 mg, 40 mg, Oral, Daily,  Theressa Millard, MD, 40 mg at 04/30/16 0814 .  rosuvastatin (CRESTOR) tablet 20 mg, 20 mg, Oral, Daily, Theressa Millard, MD, 20 mg at 04/30/16 1630 .  sodium chloride flush (NS) 0.9 % injection 3 mL, 3 mL, Intravenous, Q12H, Theressa Millard, MD, 3 mL at 04/30/16 1000 .  verapamil (CALAN-SR) CR tablet 240 mg, 240 mg, Oral, Daily, Theressa Millard, MD, 240 mg at 04/30/16 0814:  . desmopressin  0.4 mg Oral QHS  . insulin aspart  0-5 Units Subcutaneous QHS  . insulin aspart  0-9 Units Subcutaneous TID WC  . pantoprazole  40 mg Oral Daily  . rosuvastatin  20 mg Oral Daily  . sodium chloride flush  3 mL Intravenous Q12H  . verapamil  240 mg Oral Daily  :  No Known Allergies:  Family History  Problem Relation Age of Onset  . Colon cancer    . Heart failure    . Heart attack    . Lung disease Father     history  . Diabetes Mother     history  . Stroke Mother     history  . Colon polyps Neg Hx   . Rectal cancer Neg Hx   . Stomach cancer Neg Hx   . Esophageal cancer Neg Hx   :  Social History   Social History  . Marital Status: Married    Spouse Name: N/A  . Number of Children: 1  . Years of Education: N/A   Occupational History  . Not on file.   Social History Main Topics  . Smoking status: Never Smoker   . Smokeless tobacco: Never Used     Comment: never used tobacco  . Alcohol Use: 0.0 oz/week    0 Standard drinks or equivalent per week     Comment: rarely   . Drug Use: No  . Sexual Activity: Not on file   Other Topics Concern  . Not on file   Social History Narrative   Right handed.  Caffeine 3 cups daily,  Married 2 kids (one deceased).  College grad.  [employer].   :  Pertinent items are noted in HPI.  Exam: Patient Vitals for the past 24 hrs:  BP Temp Temp src Pulse Resp SpO2  05/01/16 0540 (!) 104/55 mmHg 98.7 F (37.1 C) Oral 78 18 100 %  04/30/16 2202 (!) 119/58 mmHg 98.5 F (36.9 C) Oral 76 18 100 %  04/30/16  2110 - - - - 18 -  04/30/16 1345 108/60 mmHg 99.7 F (37.6 C) Oral 84 20 95 %   As above abdomen is obese. There is some tenderness in the left side. Bowel sounds present.  He has no obvious hepatomegaly. His cardiac exam regular rate and rhythm. Lungs are clear. Extremity shows no clubbing, cyanosis or edema. Neurological exam is nonfocal. Skin exam shows no rashes, ecchymoses or petechia.    Recent Labs  04/30/16 0411 05/01/16 0444  WBC 13.3* 11.8*  HGB 11.7* 10.3*  HCT 40.0 34.4*  PLT 307 343    Recent Labs  04/29/16 1608 04/30/16 0411  NA 136 134*  K 4.1 3.9  CL 103 102  CO2 22 22  GLUCOSE 189* 123*  BUN 32* 34*  CREATININE 1.76* 1.83*  CALCIUM 9.8 9.4    Blood smear review:  None  Pathology: None     Assessment and Plan:  James [name] is a 70 year old male with a long history of polycythemia. He's done well with this. He is JAK2 negative.  I think the main factor for this thrombus is his testosterone and maybe being diabetic. His hematocrit has not been that high so I would not think hyperviscosity would be an issue. He is on aspirin which should be helpful.  He will need anticoagulation for at least a year area  I would keep him on heparin for 4 days. IV heparin is a very effective agent for portal vein thrombi. IV heparin really helps to thin out the thrombus so that the intestines can start working better. With portal vein thrombus, the intestinal bloodflow becomes slow and intestines cannot digest and I'm sure asked why he had the pain and vomiting.  I talked that he, his wife and son. Again I know James [name] for many years. He really has had no complications to date.  For now, we will just continue the heparin. I probably would then get him on one of the new oral anticoagulants. Either Xarelto or ELIQUIS would be fine depending on what his insurance will cover.  I spent about 45 minutes with he and his family. I was supposed to see him in the office tomorrow so I  will just see him today.  I appreciate the asked any care that he is getting from everybody up on 4 E!!!  [place] 3:22-23

## 2016-05-02 ENCOUNTER — Other Ambulatory Visit: Payer: PRIVATE HEALTH INSURANCE

## 2016-05-02 ENCOUNTER — Ambulatory Visit: Payer: PRIVATE HEALTH INSURANCE | Admitting: Hematology & Oncology

## 2016-05-02 DIAGNOSIS — N289 Disorder of kidney and ureter, unspecified: Secondary | ICD-10-CM

## 2016-05-02 DIAGNOSIS — K7460 Unspecified cirrhosis of liver: Secondary | ICD-10-CM

## 2016-05-02 DIAGNOSIS — R161 Splenomegaly, not elsewhere classified: Secondary | ICD-10-CM

## 2016-05-02 DIAGNOSIS — I10 Essential (primary) hypertension: Secondary | ICD-10-CM

## 2016-05-02 DIAGNOSIS — E611 Iron deficiency: Secondary | ICD-10-CM

## 2016-05-02 LAB — HEPARIN LEVEL (UNFRACTIONATED)
HEPARIN UNFRACTIONATED: 0.16 [IU]/mL — AB (ref 0.30–0.70)
HEPARIN UNFRACTIONATED: 0.35 [IU]/mL (ref 0.30–0.70)
Heparin Unfractionated: 0.36 IU/mL (ref 0.30–0.70)

## 2016-05-02 LAB — CBC
HEMATOCRIT: 33.9 % — AB (ref 39.0–52.0)
Hemoglobin: 10.2 g/dL — ABNORMAL LOW (ref 13.0–17.0)
MCH: 20.8 pg — ABNORMAL LOW (ref 26.0–34.0)
MCHC: 30.1 g/dL (ref 30.0–36.0)
MCV: 69.2 fL — AB (ref 78.0–100.0)
PLATELETS: 386 10*3/uL (ref 150–400)
RBC: 4.9 MIL/uL (ref 4.22–5.81)
RDW: 20 % — AB (ref 11.5–15.5)
WBC: 9.8 10*3/uL (ref 4.0–10.5)

## 2016-05-02 LAB — BASIC METABOLIC PANEL
Anion gap: 6 (ref 5–15)
BUN: 21 mg/dL — ABNORMAL HIGH (ref 6–20)
CHLORIDE: 110 mmol/L (ref 101–111)
CO2: 21 mmol/L — AB (ref 22–32)
Calcium: 8.8 mg/dL — ABNORMAL LOW (ref 8.9–10.3)
Creatinine, Ser: 1.25 mg/dL — ABNORMAL HIGH (ref 0.61–1.24)
GFR calc non Af Amer: 56 mL/min — ABNORMAL LOW (ref >60)
Glucose, Bld: 242 mg/dL — ABNORMAL HIGH (ref 65–99)
POTASSIUM: 4 mmol/L (ref 3.5–5.1)
SODIUM: 137 mmol/L (ref 135–145)

## 2016-05-02 LAB — GLUCOSE, CAPILLARY
GLUCOSE-CAPILLARY: 108 mg/dL — AB (ref 65–99)
GLUCOSE-CAPILLARY: 190 mg/dL — AB (ref 65–99)
Glucose-Capillary: 204 mg/dL — ABNORMAL HIGH (ref 65–99)
Glucose-Capillary: 196 mg/dL — ABNORMAL HIGH (ref 65–99)

## 2016-05-02 LAB — IRON AND TIBC
Iron: 9 ug/dL — ABNORMAL LOW (ref 45–182)
SATURATION RATIOS: 3 % — AB (ref 17.9–39.5)
TIBC: 262 ug/dL (ref 250–450)
UIBC: 253 ug/dL

## 2016-05-02 LAB — FERRITIN: FERRITIN: 88 ng/mL (ref 24–336)

## 2016-05-02 MED ORDER — HEPARIN BOLUS VIA INFUSION
2000.0000 [IU] | Freq: Once | INTRAVENOUS | Status: AC
  Administered 2016-05-02: 2000 [IU] via INTRAVENOUS
  Filled 2016-05-02: qty 2000

## 2016-05-02 NOTE — Progress Notes (Signed)
ANTICOAGULATION CONSULT NOTE - Follow Up Consult  Pharmacy Consult for Heparin Indication: new splenic vein thrombosis and partial portal venous thrombosis  No Known Allergies  Patient Measurements: Height: 5' 6"  (167.6 cm) Weight: 240 lb (108.863 kg) IBW/kg (Calculated) : 63.8 Heparin Dosing Weight: 88 kg  Vital Signs: Temp: 99.5 F (37.5 C) (05/16 0615) Temp Source: Oral (05/16 0615) BP: 142/75 mmHg (05/16 0615) Pulse Rate: 86 (05/16 0615)  Labs:  Recent Labs  04/29/16 1608 04/29/16 1920 04/30/16 0411  05/01/16 0444  05/01/16 1951 05/02/16 0504 05/02/16 1244  HGB 13.0  --  11.7*  --  10.3*  --   --  10.2*  --   HCT 43.0  --  40.0  --  34.4*  --   --  33.9*  --   PLT 342  --  307  --  343  --   --  386  --   APTT  --  30  --   --   --   --   --   --   --   LABPROT  --  15.7*  --   --   --   --   --   --   --   INR  --  1.24  --   --   --   --   --   --   --   HEPARINUNFRC  --   --  <0.10*  < >  --   < > 0.17* 0.36 0.16*  CREATININE 1.76*  --  1.83*  --   --   --   --   --  1.25*  < > = values in this interval not displayed.  Estimated Creatinine Clearance: 62.7 mL/min (by C-G formula based on Cr of 1.25).   Medications:  Infusions:  . sodium chloride 125 mL/hr at 05/02/16 1351  . heparin 2,250 Units/hr (05/02/16 1351)    Assessment: 70yo M presenting 5/13 with lower abdominal pain. 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 Pharmacy is asked to start heparin infusion.  No anticoagulants PTA, but takes low-dose aspirin.  Today, 05/02/2016: - Repeat heparin level drawn at 1300 came back subtherapeutic @ 0.17. RN reported that there was no interruption of the drip. - Hgb down slightly to 10.2, plt WNL - No bleeding observed per RN - Dr. [name] indicated that he would like to treat with heparin for 4 days and then transition to Xarelto or Eliquis (today is day #3 of heparin therapy)   Goal of Therapy:  Heparin level 0.3-0.7  units/ml Monitor platelets by anticoagulation protocol: Yes   Plan:  - Heparin bolus with 2000 units x1, then increase heparin drip rate to 2500 units/hr - Check 8 hour heparin level - Conitor for s/sx bleeding  Myrna Blazer, Pharmacy Student

## 2016-05-02 NOTE — Progress Notes (Signed)
Pt is a 43 YOM admit w/ CC midline lower abd pain x 2 wks after holding in episode of emesis, also nausea and bloating. Denies changes in urination and diarrhea. PMH includes obesity, HTN, DM, GERD, HLD, rhinitis, depression, nocturia, sleep apnea w/ CPAP, shingles, macular degeneration, polycythemia vera (JAK2 neg). Home meds inc ASA 81 mg AD, B12 500 mg SL QD, desmopressin 0.2 mg QHS, glimepiride 2 mg QD(?), metformin 1000 mg BID WM, metoprolol succ 100 mg QD, omeprazole 20 mg QD, rosuvastatin 20 mg QD, testosterone cypionate 200 mg/mL IM q21d, unknow right eye inj q8m valsartan/hctz 320/25 mg QD, verapamil 240 mg CR QD, tobra 0.3% opth 1 gtt RO q5h x5d start 4/22.  WBC 8.7 on 5/17, afebrile   PVT: Ct showed splenic infarction & swelling vein thrombosis.  Oncology thinks testosterone and DM may cause thrombus. H/H low @ 10.3/34.4 so blood viscosity is unlikely to contribute. Pt switched to Xarelto 15 mg BID x 21 days then 20 mg QD for at least 3 months.  AKI: SCr down to 1.25 on 5/16. from 1.83 on 5/14 and 1.73 on admit. (1.31 on 06/16). IBW 63.8, TBW 108.9, AdjBw 77.3, CrCl 59.3. Consider holding desmopresin d/t CrCl <50  PCV: H/H low @ 10.3/34.4, well-controlled, stable 5/17  HTN: Valsartan/HCTZ held d/t AKI. BP 111-131/58-74 OPD. Still on Verapamil home dose. HR 72-77  DM: Metformin & Amaryl held, on SSI Novolog 0-9 units TID WM sensitive scale, 0-5 units QHS. A1c pending. CBGs 105-204  LFTs WNL, rosuva on  Pantoprazole 40 mg QD for GI ppx  Likely to discharge to day, need to find out if insurance covers Xarelto.

## 2016-05-02 NOTE — Progress Notes (Signed)
PHARMACIST - PHYSICIAN COMMUNICATION CONCERNING: IV heparin   In brief, 89 yoM on IV heparin for splenic vein thrombosis and partial portal vein thrombosis. Please seen note written earlier today by Dia Sitter, PharmD for further details. Heparin level tonight is at goal (goal 0.3-0.7). No infusion issues or bleeding noted.    RECOMMENDATION: Continue heparin drip at current rate Recheck level in 8hr at 1300

## 2016-05-02 NOTE — Progress Notes (Signed)
Patient declines the use of nocturnal CPAP tonight. Complains of machine being "too noisy" and an ill-fitting mask. He prefers to use his home mask and machine. Wife states she will bring it in tomorrow. For now, hospital equipment remains at the bedside in the event he decides to be compliant later tonight. RT will continue to follow.

## 2016-05-02 NOTE — Progress Notes (Signed)
James [name] is feeling better. His abdomen is not as sore. He is on heparin.  The abdominal ultrasound did show the portal vein thrombus. Has a splenic vein thrombus.  Of note, he also has cirrhosis. 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 He has some slight splenomegaly.  He is on a heparin drip. Pharmacy is doing good job in managing this. I keep my heparin drip today and is try to switch him over to Xarelto. This is daily dosing.  He is not yet out of bed.  He is eating better. He's having no nausea or vomiting.  He's had no fever. There's been no bleeding. He's had no cough. He's had no leg swelling.  His labs today are relatively stable. His platelet count is 386,000. Hemoglobin is 10.2. He does have some chronic renal insufficiency.  I suspect he also has some degree of iron deficiency. Lollies here, we can probably check his iron studies.  On his physical exam, he is afebrile. Vital signs are all stable.  On physical exam, his lungs are clear. Cardiac exam regular rate and rhythm with no murmurs, rubs or bruits. Abdomen is soft. He is obese. Abdomen is with slightly decreased bowel sounds. There is no guarding or rebound tenderness. Extremities shows no clubbing, cyanosis or edema.  James [name] has a portal vein thrombosis. I suspect this might be from the underlying cirrhosis. He also IV from him taking the testosterone supplementation. He does have polycythemia vera abut his hemoglobin/hematocrit have improved he well controlled.  He is on heparin. I would continue heparin for today. Tomorrow, to switch him over to an oral anticoagulant. Given his renal efficiency, he may need to be on ELIQUIS.  I will check iron studies on him.  I probably would not check another abdominal ultrasound for about 3 months to see how his thrombus is responding.  I appreciate the fantastic care that he is getting from the nurses and staff on 4 E.  [place] 91:1-2

## 2016-05-02 NOTE — Progress Notes (Addendum)
PROGRESS NOTE    James [name].  NZV:[id number] DOB: [birth year] DOA: 04/29/2016 PCP: [name], MD  Outpatient Specialists: Dr [name] Hematology oncology    Brief Narrative: 70 year old gentleman with h/o of PCV admitted for portal vein thrombosis.    Assessment & Plan:   Principal Problem:   Portal vein thrombosis Active Problems:   Polycythemia vera ([facility])   Essential hypertension   Splenic infarction   Diabetes mellitus ([facility])   Leukocytosis   AKI (acute kidney injury) ([place])   Sleep apnea    Portal vein thrombosis: Splenic infarction: Started on IV heparin. Hematology consulted.  Recommended 4 days of IV heparin, plan to start pt on  Eliquis on Wednesday on discharge.    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 Monitor hemoglobin.    DM: CBG (last 3)   Recent Labs  05/01/16 1623 05/01/16 2118 05/02/16 0717  GLUCAP 90 147* 108*    Resume SSI.  hgba1c is 7.5  HYPERTENSION: Controlled.    Hyperlipidemia: - resume crestor.   Nausea, vomiting abd pain possibly from the portal vein thrombosis.  - US abdomen ordered, shows apparent thrombus occluding the portal vein and splenic vein, splenomegaly with splenic infarcts and cirrhosis. His nausea, and vomiting has resolved. abd pain is better and his appettite has improved.    DVT prophylaxis: IV heparin.  Code Status: (Full) Family Communication: wife at bedside Disposition Plan: pending further management.    Consultants:   Hematology. Dr [name]   Procedures: none   Antimicrobials:none   Subjective: Pain is better.   Objective: Filed Vitals:   05/01/16 1455 05/01/16 2016 05/01/16 2122 05/02/16 0615  BP: 113/73  118/62 142/75  Pulse: 82 77 92 86  Temp: 98.3 F (36.8 C)  100.1 F (37.8 C) 99.5 F (37.5 C)  TempSrc: Oral  Oral Oral  Resp: 20 20 20 20   Height:      Weight:      SpO2: 97% 99% 98% 98%    Intake/Output Summary (Last 24 hours) at 05/02/16 1150 Last data filed at  05/02/16 1106  Gross per 24 hour  Intake 3534.22 ml  Output   1902 ml  Net 1632.22 ml   Filed Weights   04/29/16 1944  Weight: 108.863 kg (240 lb)    Examination:  General exam: Appears calm and comfortable  Respiratory system: Clear to auscultation. Respiratory effort normal. Cardiovascular system: S1 & S2 heard, RRR. No JVD, murmurs, rubs, gallops or clicks. No pedal edema. Gastrointestinal system: Abdomen obese, tender generalized.bowel sounds heard.  Central nervous system: Alert and oriented. No focal neurological deficits. Extremities: Symmetric 5 x 5 power. Skin: No rashes, lesions or ulcers Psychiatry: Judgement and insight appear normal. Mood & affect appropriate.     Data Reviewed: I have personally reviewed following labs and imaging studies  CBC:  Recent Labs Lab 04/29/16 1608 04/30/16 0411 05/01/16 0444 05/02/16 0504  WBC 14.9* 13.3* 11.8* 9.8  HGB 13.0 11.7* 10.3* 10.2*  HCT 43.0 40.0 34.4* 33.9*  MCV 68.7* 69.3* 69.2* 69.2*  PLT 342 307 343 615   Basic Metabolic Panel:  Recent Labs Lab 04/29/16 1608 04/30/16 0411  NA 136 134*  K 4.1 3.9  CL 103 102  CO2 22 22  GLUCOSE 189* 123*  BUN 32* 34*  CREATININE 1.76* 1.83*  CALCIUM 9.8 9.4   GFR: Estimated Creatinine Clearance: 42.8 mL/min (by C-G formula based on Cr of 1.83). Liver Function Tests:  Recent Labs Lab 04/29/16 1608  AST 31  ALT 30  ALKPHOS 73  BILITOT 0.6  PROT 6.7  ALBUMIN 3.2*    Recent Labs Lab 04/29/16 1608  LIPASE 24   No results for input(s): AMMONIA in the last 168 hours. Coagulation Profile:  Recent Labs Lab 04/29/16 1920  INR 1.24   Cardiac Enzymes: No results for input(s): CKTOTAL, CKMB, CKMBINDEX, TROPONINI in the last 168 hours. BNP (last 3 results) No results for input(s): PROBNP in the last 8760 hours. HbA1C:  Recent Labs  04/30/16 0411  HGBA1C 7.5*   CBG:  Recent Labs Lab 05/01/16 0724 05/01/16 1141 05/01/16 1623 05/01/16 2118  05/02/16 0717  GLUCAP 90 105* 90 147* 108*   Lipid Profile: No results for input(s): CHOL, HDL, LDLCALC, TRIG, CHOLHDL, LDLDIRECT in the last 72 hours. Thyroid Function Tests: No results for input(s): TSH, T4TOTAL, FREET4, T3FREE, THYROIDAB in the last 72 hours. Anemia Panel: No results for input(s): VITAMINB12, FOLATE, FERRITIN, TIBC, IRON, RETICCTPCT in the last 72 hours. Urine analysis:    Component Value Date/Time   COLORURINE AMBER* 04/29/2016 1855   APPEARANCEUR CLEAR 04/29/2016 1855   LABSPEC >1.046* 04/29/2016 1855   PHURINE 5.0 04/29/2016 1855   GLUCOSEU NEGATIVE 04/29/2016 1855   HGBUR NEGATIVE 04/29/2016 1855   BILIRUBINUR NEGATIVE 04/29/2016 1855   KETONESUR NEGATIVE 04/29/2016 1855   PROTEINUR NEGATIVE 04/29/2016 1855   UROBILINOGEN 0.2 02/02/2009 0805   NITRITE NEGATIVE 04/29/2016 1855   LEUKOCYTESUR NEGATIVE 04/29/2016 1855   Sepsis Labs: No results for input(s): PROCALCITON, LATICACIDVEN in the last 168 hours.  No results found for this or any previous visit (from the past 240 hour(s)).       Radiology Studies: [place] Art/ven Flow Abd Pelv Doppler  05/01/2016  CLINICAL DATA:  Evaluate portal vein thrombosis. EXAM: DUPLEX ULTRASOUND OF LIVER TECHNIQUE: Color and duplex Doppler ultrasound was performed to evaluate the hepatic in-flow and out-flow vessels. COMPARISON:  CT, 04/29/2016 FINDINGS: Portal Vein Velocities Main:  Occluded with thrombus. Right:  Occluded Left:  Occluded Hepatic Vein Velocities Right:  49.5 cm/sec Middle:  35.3 cm/sec Left:  24.8 cm/sec Hepatic Artery Velocity:  146.2 cm/sec Splenic Vein Velocity:  Occluded with thrombus Spleen enlarged with a volume of 846 mL. Hypoechoic areas in the spleen consistent with the infarcts noted on CT. Varices: None visualized. Ascites: Small amount ascites noted adjacent to the liver. Liver shows a coarsened echotexture and subtle surface irregularity. IMPRESSION: 1. Apparent thrombus occludes the portal vein  including the central right and left branches. 2. Apparent thrombus occludes the splenic vein. 3. Splenomegaly with hypoechoic areas consistent with splenic infarcts noted on CT. 4. Coarsened liver echotexture and subtle surface irregularity. Consider cirrhosis. 5. Small amount ascites. Electronically Signed   By: Lajean Manes M.D.   On: 05/01/2016 15:52        Scheduled Meds: . desmopressin  0.4 mg Oral QHS  . insulin aspart  0-5 Units Subcutaneous QHS  . insulin aspart  0-9 Units Subcutaneous TID WC  . pantoprazole  40 mg Oral Daily  . rosuvastatin  20 mg Oral Daily  . sodium chloride flush  3 mL Intravenous Q12H  . verapamil  240 mg Oral Daily   Continuous Infusions: . sodium chloride 125 mL/hr at 05/02/16 0540  . heparin 2,250 Units/hr (05/02/16 0146)     LOS: 3 days    Time spent: 30 minutes.     Hosie Poisson, MD [facility] Pager [phone]  If 7PM-7AM, please contact night-coverage [email or web address] Password [email or web address] 05/02/2016, 11:50 AM

## 2016-05-03 ENCOUNTER — Other Ambulatory Visit: Payer: Self-pay | Admitting: Hematology & Oncology

## 2016-05-03 ENCOUNTER — Encounter: Payer: Self-pay | Admitting: Hematology & Oncology

## 2016-05-03 DIAGNOSIS — I81 Portal vein thrombosis: Secondary | ICD-10-CM

## 2016-05-03 DIAGNOSIS — K7460 Unspecified cirrhosis of liver: Secondary | ICD-10-CM | POA: Insufficient documentation

## 2016-05-03 DIAGNOSIS — K7581 Nonalcoholic steatohepatitis (NASH): Secondary | ICD-10-CM

## 2016-05-03 LAB — CBC
HCT: 33.1 % — ABNORMAL LOW (ref 39.0–52.0)
Hemoglobin: 10 g/dL — ABNORMAL LOW (ref 13.0–17.0)
MCH: 20.5 pg — AB (ref 26.0–34.0)
MCHC: 30.2 g/dL (ref 30.0–36.0)
MCV: 67.8 fL — AB (ref 78.0–100.0)
PLATELETS: 418 10*3/uL — AB (ref 150–400)
RBC: 4.88 MIL/uL (ref 4.22–5.81)
RDW: 20 % — AB (ref 11.5–15.5)
WBC: 8.7 10*3/uL (ref 4.0–10.5)

## 2016-05-03 LAB — GLUCOSE, CAPILLARY: Glucose-Capillary: 147 mg/dL — ABNORMAL HIGH (ref 65–99)

## 2016-05-03 MED ORDER — RIVAROXABAN 20 MG PO TABS: 20.0000 mg | ORAL_TABLET | Freq: Every day | ORAL | Status: DC

## 2016-05-03 MED ORDER — RIVAROXABAN 15 MG PO TABS: 15.0000 mg | ORAL_TABLET | Freq: Two times a day (BID) | ORAL | Status: DC

## 2016-05-03 MED ORDER — OXYCODONE HCL 5 MG PO TABS: 5.0000 mg | ORAL_TABLET | ORAL | Status: DC | PRN

## 2016-05-03 MED ORDER — RIVAROXABAN 15 MG PO TABS
15.0000 mg | ORAL_TABLET | Freq: Two times a day (BID) | ORAL | Status: DC
  Administered 2016-05-03: 15 mg via ORAL
  Filled 2016-05-03: qty 1

## 2016-05-03 MED ORDER — HEPARIN (PORCINE) IN NACL 100-0.45 UNIT/ML-% IJ SOLN
2500.0000 [IU]/h | INTRAMUSCULAR | Status: AC
  Filled 2016-05-03: qty 250

## 2016-05-03 NOTE — Progress Notes (Signed)
ANTICOAGULATION CONSULT NOTE - Follow Up Consult  Pharmacy Consult for Heparin Indication: splenic vein thrombosis and partial portal vein thrombosis  No Known Allergies  Patient Measurements: Height: 5' 6"  (167.6 cm) Weight: 240 lb (108.863 kg) IBW/kg (Calculated) : 63.8 Heparin Dosing Weight:   Vital Signs: Temp: 98.9 F (37.2 C) (05/16 2130) Temp Source: Oral (05/16 2130) BP: 111/58 mmHg (05/16 2130) Pulse Rate: 73 (05/16 2130)  Labs:  Recent Labs  04/30/16 0411  05/01/16 0444  05/02/16 0504 05/02/16 1244 05/02/16 2234  HGB 11.7*  --  10.3*  --  10.2*  --   --   HCT 40.0  --  34.4*  --  33.9*  --   --   PLT 307  --  343  --  386  --   --   HEPARINUNFRC <0.10*  < >  --   < > 0.36 0.16* 0.35  CREATININE 1.83*  --   --   --   --  1.25*  --   < > = values in this interval not displayed.  Estimated Creatinine Clearance: 62.7 mL/min (by C-G formula based on Cr of 1.25).   Medications:  Infusions:  . sodium chloride 125 mL/hr at 05/02/16 2148  . heparin 2,500 Units/hr (05/02/16 2347)    Assessment: Heparin level at goal again.  No heparin issues noted.  Goal of Therapy:  Heparin level 0.3-0.7 units/ml Monitor platelets by anticoagulation protocol: Yes   Plan:  Continue heparin drip at current rate Recheck level at 0800  James [name], James [name] 05/03/2016,12:22 AM

## 2016-05-03 NOTE — Care Management Note (Signed)
Case Management Note  Patient Details  Name: James [name]. MRN: [id number] Date of Birth: 1946-08-19  Subjective/Objective:Noted pharmacy has already provided patient w/xarelto starter kit that has a free 30 day trial offer.                   Action/Plan:d/c home no further d/c needs or orders.   Expected Discharge Date:                 Expected Discharge Plan:  Home/Self Care  In-House Referral:     Discharge planning Services  CM Consult  Post Acute Care Choice:    Choice offered to:     DME Arranged:    DME Agency:     HH Arranged:    [place] Agency:     Status of Service:  Completed, signed off  Medicare Important Message Given:    Date Medicare IM Given:    Medicare IM give by:    Date Additional Medicare IM Given:    Additional Medicare Important Message give by:     If discussed at [place] of Stay Meetings, dates discussed:    Additional Comments:  Dessa Phi, RN 05/03/2016, 10:45 AM

## 2016-05-03 NOTE — Discharge Instructions (Signed)
Information on my medicine - XARELTO (rivaroxaban)  This medication education was reviewed with me or my healthcare representative as part of my discharge preparation.  The pharmacist that spoke with me during my hospital stay was:  Cheral Almas, Stu-PharmD  WHY WAS Dale? Xarelto was prescribed to treat blood clots that may have been found in the veins of your legs (deep vein thrombosis) or in your lungs (pulmonary embolism) and to reduce the risk of them occurring again.  What do you need to know about Xarelto? The starting dose is one 15 mg tablet taken TWICE daily with food for the FIRST 21 DAYS then on  June 7th, 2017  the dose is changed to one 20 mg tablet taken ONCE A DAY with your evening meal.  DO NOT stop taking Xarelto without talking to the health care provider who prescribed the medication.  Refill your prescription for 20 mg tablets before you run out.  After discharge, you should have regular check-up appointments with your healthcare provider that is prescribing your Xarelto.  In the future your dose may need to be changed if your kidney function changes by a significant amount.  What do you do if you miss a dose? If you are taking Xarelto TWICE DAILY and you miss a dose, take it as soon as you remember. You may take two 15 mg tablets (total 30 mg) at the same time then resume your regularly scheduled 15 mg twice daily the next day.  If you are taking Xarelto ONCE DAILY and you miss a dose, take it as soon as you remember on the same day then continue your regularly scheduled once daily regimen the next day. Do not take two doses of Xarelto at the same time.   Important Safety Information Xarelto is a blood thinner medicine that can cause bleeding. You should call your healthcare provider right away if you experience any of the following: ? Bleeding from an injury or your nose that does not stop. ? Unusual colored urine (red or dark brown) or  unusual colored stools (red or black). ? Unusual bruising for unknown reasons. ? A serious fall or if you hit your head (even if there is no bleeding).  Some medicines may interact with Xarelto and might increase your risk of bleeding while on Xarelto. To help avoid this, consult your healthcare provider or pharmacist prior to using any new prescription or non-prescription medications, including herbals, vitamins, non-steroidal anti-inflammatory drugs (NSAIDs) and supplements.  This website has more information on Xarelto: [email or web address]

## 2016-05-03 NOTE — Care Management Note (Signed)
Case Management Note  Patient Details  Name: James [name]. MRN: [id number] Date of Birth: 18-Oct-1946  Subjective/Objective:  Will provide xarelto 30 day trial offer coupon.                  Action/Plan:d/c plan home.   Expected Discharge Date:                 Expected Discharge Plan:  Home/Self Care  In-House Referral:     Discharge planning Services  CM Consult  Post Acute Care Choice:    Choice offered to:     DME Arranged:    DME Agency:     HH Arranged:    [place] Agency:     Status of Service:  Completed, signed off  Medicare Important Message Given:    Date Medicare IM Given:    Medicare IM give by:    Date Additional Medicare IM Given:    Additional Medicare Important Message give by:     If discussed at [place] of Stay Meetings, dates discussed:    Additional Comments:  Dessa Phi, RN 05/03/2016, 10:41 AM

## 2016-05-03 NOTE — Progress Notes (Signed)
James [name] is feeling well. He had no problems yesterday. He is eating better. I  The next 2 weeks, that he really needs to go easy on food. He should not have any fried or greasy food. He needs small meals. His intestines still need to recover from the portal vein thrombus. As the thrombus continues to resolve, he will get better blood flow through his intestines to aid in digestion.  He is out of bed. He is now off the cardiac monitor.  His renal function is better.  He's had no problems with diarrhea. He's had no cough. He's had no nausea or vomiting.  His iron studies do show low iron which is not surprising. With polycythemia, we want to make sure that iron levels are low.  Pharmacy has done a fantastic job with monitoring his heparin. I think that we can switch him over to oral therapy now. With his renal function better, I think Xarelto would be appropriate. I'll start him on a bolus dose and keep him on the bolus dose for 3 weeks and then get him on his maintenance dose area hopefully, his insurance, we will pay for this.   On his physical exam, his vital signs are all stable. Blood pressure 131/71. Temperature 98.4. Pulse is 76. His lungs are clear. Cardiac exam regular rate and rhythm with no murmurs, rubs or bruits. Abdomen is obese but soft. He has decent bowel sounds. There is no guarding. I really cannot palpate too much tenderness in the abdomen. Extremity shows no clubbing, cyanosis or edema. Skin exam shows no rashes, ecchymoses or petechia. Neurological exam shows no focal neurological deficits.  Hopefully, he will be oh to be discharged today. If he gets on oral therapy, I would want to make sure that his insurance will cover outpatient administration of Xarelto. That would be the only hold up for him going home.  If he goes home today, I will see him back in the office in 3 weeks.  I appreciate the outstanding and compassionate care that is gotten from everybody up on 4  E.  Vernard Gambles 3:19

## 2016-05-04 ENCOUNTER — Encounter: Payer: Self-pay | Admitting: *Deleted

## 2016-05-04 ENCOUNTER — Other Ambulatory Visit: Payer: Self-pay | Admitting: *Deleted

## 2016-05-08 ENCOUNTER — Other Ambulatory Visit: Payer: PRIVATE HEALTH INSURANCE

## 2016-05-08 ENCOUNTER — Ambulatory Visit: Payer: PRIVATE HEALTH INSURANCE | Admitting: Hematology & Oncology

## 2016-05-10 NOTE — Discharge Summary (Signed)
Physician Discharge Summary  James [name]. RCV:[id number] DOB: [birth year] DOA: 04/29/2016  PCP: James Lopes, MD  Admit date: 04/29/2016 Discharge date: 05/03/2016  Time spent: 20 minutes  Recommendations for Outpatient Follow-up:  1. Follow up with oncology as recommended.    Discharge Diagnoses:  Principal Problem:   Portal vein thrombosis Active Problems:   Polycythemia vera (HCC)   Essential hypertension   Splenic infarction   Diabetes mellitus (HCC)   Leukocytosis   AKI (acute kidney injury) ([place])   Sleep apnea   Discharge Condition: improved  Diet recommendation: carb modified diet.   Filed Weights   04/29/16 1944  Weight: 108.863 kg (240 lb)    History of present illness:  70 year old gentleman with h/o of PCV admitted for portal vein thrombosis.   Hospital Course:  Portal vein thrombosis: Splenic infarction: Started on IV heparin. Hematology consulted.  Recommended 4 days of IV heparin, started xarelto on discharge. Recommend outpatient follow upwith oncology.  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 Monitor hemoglobin.    DM: CBG (last 3)   Recent Labs (last 2 labs)      Recent Labs  05/01/16 1623 05/01/16 2118 05/02/16 0717  GLUCAP 90 147* 108*      Resume SSI.  hgba1c is 7.5  HYPERTENSION: Controlled.    Hyperlipidemia: - resume crestor.   Nausea, vomiting abd pain possibly from the portal vein thrombosis.  - US abdomen ordered, shows apparent thrombus occluding the portal vein and splenic vein, splenomegaly with splenic infarcts and cirrhosis. His nausea, and vomiting has resolved. abd pain is better and his appettite has improved.        Procedures:  none  Consultations:  oncology  Discharge Exam: Filed Vitals:   05/03/16 0610 05/03/16 0900  BP: 131/71 135/74  Pulse: 76   Temp: 98.4 F (36.9 C)   Resp: 18     General: alert comfortable.  Cardiovascular: s1s2 Respiratory: ctab  Discharge  Instructions   Discharge Instructions    Call MD for:  difficulty breathing, headache or visual disturbances    Complete by:  As directed      Call MD for:  extreme fatigue    Complete by:  As directed      Call MD for:  persistant dizziness or light-headedness    Complete by:  As directed      Call MD for:  persistant nausea and vomiting    Complete by:  As directed      Call MD for:  severe uncontrolled pain    Complete by:  As directed      Call MD for:  temperature >100.4    Complete by:  As directed      Diet - low sodium heart healthy    Complete by:  As directed      Discharge instructions    Complete by:  As directed   Please follow up with Dr James [name] in 2 to 3 weeks as recommended.          Discharge Medication List as of 05/03/2016 10:44 AM    START taking these medications   Details  oxyCODONE (OXY IR/ROXICODONE) 5 MG immediate release tablet Take 1 tablet (5 mg total) by mouth every 4 (four) hours as needed for moderate pain., Starting 05/03/2016, Until Discontinued, Print    !! Rivaroxaban (XARELTO) 15 MG TABS tablet Take 1 tablet (15 mg total) by mouth 2 (two) times daily with a meal., Starting 05/03/2016, Until Tue 05/23/16, Print    !!  rivaroxaban (XARELTO) 20 MG TABS tablet Take 1 tablet (20 mg total) by mouth daily with supper., Starting 05/24/2016, Until Discontinued, Print     !! - Potential duplicate medications found. Please discuss with provider.    CONTINUE these medications which have NOT CHANGED   Details  [facility] B-12 500 MCG SUBL Place 1 tablet under the tongue daily. , Starting 09/17/2014, Until Discontinued, Historical Med    desmopressin (DDAVP) 0.2 MG tablet Take 0.4 mg by mouth at bedtime. , Starting 03/21/2014, Until Discontinued, Historical Med    glimepiride (AMARYL) 2 MG tablet Take 2-4 mg by mouth 2 (two) times daily. Take 71ms in the morning and 270m at night, Starting 03/21/2014, Until Discontinued, Historical Med    metFORMIN (GLUCOPHAGE-XR) 500 MG  24 hr tablet Take 500 mg by mouth daily with breakfast., Until Discontinued, Historical Med    omeprazole (PRILOSEC) 20 MG capsule Take 20 mg by mouth daily.  , Until Discontinued, Historical Med    promethazine (PHENERGAN) 25 MG tablet Take 25 mg by mouth 3 (three) times daily as needed for nausea or vomiting., Until Discontinued, Historical Med    rosuvastatin (CRESTOR) 20 MG tablet Take 20 mg by mouth daily.  , Until Discontinued, Historical Med    testosterone cypionate (DEPOTESTOTERONE CYPIONATE) 200 MG/ML injection Inject 200 mg into the muscle every 21 ( twenty-one) days. , Until Discontinued, Historical Med    tobramycin (TOBREX) 0.3 % ophthalmic solution Place 1 drop into the right eye every 4 (four) hours. X 5 days, Starting 04/08/2016, Until Discontinued, Print    UNKNOWN TO PATIENT Right eye injections every 2 months, Until Discontinued, Historical Med    valsartan-hydrochlorothiazide (DIOVAN-HCT) 320-25 MG per tablet Take 1 tablet by mouth daily. , Starting 07/20/2013, Until Discontinued, Historical Med    verapamil (CALAN-SR) 240 MG CR tablet Take 240 mg by mouth daily., Until Discontinued, Historical Med    VIAGRA 100 MG tablet Starting 10/26/2014, Until Discontinued, Historical Med    ONE TOUCH ULTRA TEST test strip by Other route as needed. , Starting 08/04/2013, Until Discontinued, Historical Med      STOP taking these medications     aspirin EC 81 MG tablet        No Known Allergies Follow-up Information    Follow up with PADonnajean LopesMD. Schedule an appointment as soon as possible for a visit in 1 week.   Specialty:  Internal Medicine   Contact information:   [address] [phone]     Follow up with ENVolanda NapoleonMD. Schedule an appointment as soon as possible for a visit in 3 weeks.   Specialty:  Oncology   Contact information:   [address] [place] [phone]      The results  of significant diagnostics from this hospitalization (including imaging, microbiology, ancillary and laboratory) are listed below for reference.    Significant Diagnostic Studies: Ct Abdomen Pelvis W Contrast  04/29/2016  CLINICAL DATA:  Left upper quadrant pain, history of polycythemia vera EXAM: CT ABDOMEN AND PELVIS WITH CONTRAST TECHNIQUE: Multidetector CT imaging of the abdomen and pelvis was performed using the standard protocol following bolus administration of intravenous contrast. CONTRAST:  8038mSOVUE-300 IOPAMIDOL (ISOVUE-300) INJECTION 61% COMPARISON:  None. FINDINGS: Lung bases demonstrate some mild left lower lobe atelectasis as well as a small pleural effusion. These are likely reactive in nature. The liver is within normal limits. The gallbladder, adrenal glands and  pancreas are within normal limits. Kidneys demonstrate bilateral renal cystic change worse on the left than the right. The largest of these cysts measures 7 cm in greatest dimension. No renal calculi or obstructive changes are seen. In the left upper quadrant, the spleen demonstrates mottled enhancement which persist on delayed imaging. These changes are consistent with splenic infarction. Additionally there are changes consistent with thrombosis of the splenic vein which appears to extend at least for short distance into the main portal vein. The superior mesenteric vein does not appear to be thrombosed. Contrast enhancement is somewhat limited due to the timing of the contrast bolus. Diverticular change of the colon is noted without definitive diverticulitis. Very mild ascites is noted predominately in the left pericolic gutter and surrounding the liver. This is likely reactive in nature to the changes in the spleen. Aortoiliac calcifications are noted. No aneurysmal dilatation is seen. The appendix is within normal limits. The bladder is well distended. No pelvic mass lesion is seen. No acute bony abnormality is noted. IMPRESSION:  Changes consistent with splenic infarction and apparent splenic vein thrombosis and partial portal venous thrombosis. Ultrasound evaluation may be helpful for further clarification of this thrombus as far as its extension into the intrahepatic branches of the portal vein. Diverticulosis without diverticulitis. Mild ascites likely reactive in nature. Left lower lobe changes as described likely reactive in nature. Critical Value/emergent results were called by telephone at the time of interpretation on 04/29/2016 at 6:13 pm to Dr. Daleen Bo , who verbally acknowledged these results. Electronically Signed   By: Inez Catalina M.D.   On: 04/29/2016 18:13   [place] Art/ven Flow Abd Pelv Doppler  05/01/2016  CLINICAL DATA:  Evaluate portal vein thrombosis. EXAM: DUPLEX ULTRASOUND OF LIVER TECHNIQUE: Color and duplex Doppler ultrasound was performed to evaluate the hepatic in-flow and out-flow vessels. COMPARISON:  CT, 04/29/2016 FINDINGS: Portal Vein Velocities Main:  Occluded with thrombus. Right:  Occluded Left:  Occluded Hepatic Vein Velocities Right:  49.5 cm/sec Middle:  35.3 cm/sec Left:  24.8 cm/sec Hepatic Artery Velocity:  146.2 cm/sec Splenic Vein Velocity:  Occluded with thrombus Spleen enlarged with a volume of 846 mL. Hypoechoic areas in the spleen consistent with the infarcts noted on CT. Varices: None visualized. Ascites: Small amount ascites noted adjacent to the liver. Liver shows a coarsened echotexture and subtle surface irregularity. IMPRESSION: 1. Apparent thrombus occludes the portal vein including the central right and left branches. 2. Apparent thrombus occludes the splenic vein. 3. Splenomegaly with hypoechoic areas consistent with splenic infarcts noted on CT. 4. Coarsened liver echotexture and subtle surface irregularity. Consider cirrhosis. 5. Small amount ascites. Electronically Signed   By: Lajean Manes M.D.   On: 05/01/2016 15:52    Microbiology: No results found for this or any previous  visit (from the past 240 hour(s)).   Labs: Basic Metabolic Panel: No results for input(s): NA, K, CL, CO2, GLUCOSE, BUN, CREATININE, CALCIUM, MG, PHOS in the last 168 hours. Liver Function Tests: No results for input(s): AST, ALT, ALKPHOS, BILITOT, PROT, ALBUMIN in the last 168 hours. No results for input(s): LIPASE, AMYLASE in the last 168 hours. No results for input(s): AMMONIA in the last 168 hours. CBC: No results for input(s): WBC, NEUTROABS, HGB, HCT, MCV, PLT in the last 168 hours. Cardiac Enzymes: No results for input(s): CKTOTAL, CKMB, CKMBINDEX, TROPONINI in the last 168 hours. BNP: BNP (last 3 results) No results for input(s): BNP in the last 8760 hours.  ProBNP (last 3 results)  No results for input(s): PROBNP in the last 8760 hours.  CBG: No results for input(s): GLUCAP in the last 168 hours.     SignedHosie Poisson MD.  [facility] 05/10/2016, 9:56 AM

## 2016-05-19 ENCOUNTER — Ambulatory Visit: Payer: PRIVATE HEALTH INSURANCE | Admitting: Hematology & Oncology

## 2016-05-19 ENCOUNTER — Other Ambulatory Visit: Payer: PRIVATE HEALTH INSURANCE

## 2016-05-22 ENCOUNTER — Other Ambulatory Visit (HOSPITAL_BASED_OUTPATIENT_CLINIC_OR_DEPARTMENT_OTHER): Payer: PRIVATE HEALTH INSURANCE

## 2016-05-22 ENCOUNTER — Ambulatory Visit: Payer: PRIVATE HEALTH INSURANCE

## 2016-05-22 ENCOUNTER — Ambulatory Visit (HOSPITAL_BASED_OUTPATIENT_CLINIC_OR_DEPARTMENT_OTHER): Payer: PRIVATE HEALTH INSURANCE | Admitting: Hematology & Oncology

## 2016-05-22 ENCOUNTER — Encounter: Payer: Self-pay | Admitting: Hematology & Oncology

## 2016-05-22 VITALS — BP 117/72 | HR 80 | Temp 98.0°F | Resp 16 | Ht 66.0 in | Wt 227.0 lb

## 2016-05-22 DIAGNOSIS — D751 Secondary polycythemia: Secondary | ICD-10-CM

## 2016-05-22 DIAGNOSIS — R161 Splenomegaly, not elsewhere classified: Secondary | ICD-10-CM

## 2016-05-22 DIAGNOSIS — D735 Infarction of spleen: Secondary | ICD-10-CM

## 2016-05-22 DIAGNOSIS — I81 Portal vein thrombosis: Secondary | ICD-10-CM

## 2016-05-22 DIAGNOSIS — D45 Polycythemia vera: Secondary | ICD-10-CM

## 2016-05-22 DIAGNOSIS — K7581 Nonalcoholic steatohepatitis (NASH): Secondary | ICD-10-CM | POA: Diagnosis not present

## 2016-05-22 LAB — COMPREHENSIVE METABOLIC PANEL
ALBUMIN: 3.9 g/dL (ref 3.5–5.0)
ALK PHOS: 87 U/L (ref 40–150)
ALT: 18 U/L (ref 0–55)
AST: 19 U/L (ref 5–34)
Anion Gap: 9 mEq/L (ref 3–11)
BUN: 20.1 mg/dL (ref 7.0–26.0)
CO2: 24 meq/L (ref 22–29)
Calcium: 10.7 mg/dL — ABNORMAL HIGH (ref 8.4–10.4)
Chloride: 104 mEq/L (ref 98–109)
Creatinine: 1.4 mg/dL — ABNORMAL HIGH (ref 0.7–1.3)
EGFR: 51 mL/min/{1.73_m2} — AB (ref >90)
GLUCOSE: 114 mg/dL (ref 70–140)
POTASSIUM: 4.4 meq/L (ref 3.5–5.1)
Sodium: 138 mEq/L (ref 136–145)
TOTAL PROTEIN: 7.8 g/dL (ref 6.4–8.3)
Total Bilirubin: 0.71 mg/dL (ref 0.20–1.20)

## 2016-05-22 LAB — IRON AND TIBC
%SAT: 6 % — AB (ref 20–55)
Iron: 27 ug/dL — ABNORMAL LOW (ref 42–163)
TIBC: 420 ug/dL — AB (ref 202–409)
UIBC: 393 ug/dL — AB (ref 117–376)

## 2016-05-22 LAB — CBC WITH DIFFERENTIAL (CANCER CENTER ONLY)
BASO#: 0 10*3/uL (ref 0.0–0.2)
BASO%: 0.4 % (ref 0.0–2.0)
EOS%: 2.6 % (ref 0.0–7.0)
Eosinophils Absolute: 0.2 10*3/uL (ref 0.0–0.5)
HEMATOCRIT: 43.3 % (ref 38.7–49.9)
HEMOGLOBIN: 13.1 g/dL (ref 13.0–17.1)
LYMPH#: 1.3 10*3/uL (ref 0.9–3.3)
LYMPH%: 15.9 % (ref 14.0–48.0)
MCH: 21.3 pg — ABNORMAL LOW (ref 28.0–33.4)
MCHC: 30.3 g/dL — ABNORMAL LOW (ref 32.0–35.9)
MCV: 70 fL — ABNORMAL LOW (ref 82–98)
MONO#: 1.1 10*3/uL — ABNORMAL HIGH (ref 0.1–0.9)
MONO%: 13.4 % — AB (ref 0.0–13.0)
NEUT%: 67.7 % (ref 40.0–80.0)
NEUTROS ABS: 5.5 10*3/uL (ref 1.5–6.5)
Platelets: 363 10*3/uL (ref 145–400)
RBC: 6.16 10*6/uL — ABNORMAL HIGH (ref 4.20–5.70)
RDW: 22.5 % — AB (ref 11.1–15.7)
WBC: 8.1 10*3/uL (ref 4.0–10.0)

## 2016-05-22 LAB — FERRITIN: Ferritin: 33 ng/ml (ref 22–316)

## 2016-05-22 LAB — CHCC SATELLITE - SMEAR

## 2016-05-22 NOTE — Progress Notes (Signed)
No phlebotomy today per Dr [name]

## 2016-05-22 NOTE — Progress Notes (Signed)
Hematology and Oncology Follow Up Visit  James [name] [id number] 15-Feb-1946 70 y.o. 05/22/2016   Principle Diagnosis:   Portal vein thrombus  Polycythemia vera  NASH with splenomegaly  Current Therapy:    Xarelto 20 mg by mouth daily-patient to be on therapy for 1 year  Phlebotomy to maintain hematocrit less than 45%     Interim History:  James [name] is back for follow-up. Unfortunate, he was hospitalized recently with abdominal pain. He is found to have a portal vein thrombus. He was started on anticoagulation. He is on heparin in the hospital. He really received Xarelto as an outpatient. He currently is on twice a Xarelto but will be on the 20 mg dose next week.  His workup showed that he had NASH with some likely cirrhotic changes. He did have a splenic infarct. He has some splenomegaly.  It was felt that the polycythemia was the likely source for his thrombus. I debate this. His hemoglobin has been well controlled.  I think that the fact that he has NASH with some cirrhotic changes might be the reason.  He is feeling a lot better. He is eating well. He is back to work. He's had no nausea or vomiting. He's had no bleeding. He's had no change in bowel or bladder habits. He's had no leg swelling. He's had no rashes.  Overall, his performance status is ECOG 1.  Medications:  Current outpatient prescriptions:  .  [facility] B-12 500 MCG SUBL, Place 1 tablet under the tongue daily. , Disp: , Rfl: 12 .  desmopressin (DDAVP) 0.2 MG tablet, Take 0.4 mg by mouth at bedtime. , Disp: , Rfl:  .  glimepiride (AMARYL) 2 MG tablet, Take 2-4 mg by mouth 2 (two) times daily. Take 26ms in the morning and 282m at night, Disp: , Rfl:  .  metFORMIN (GLUCOPHAGE-XR) 500 MG 24 hr tablet, Take 500 mg by mouth daily with breakfast., Disp: , Rfl:  .  omeprazole (PRILOSEC) 20 MG capsule, Take 20 mg by mouth daily.  , Disp: , Rfl:  .  ONE TOUCH ULTRA TEST test strip, by Other route as needed. , Disp: , Rfl:    .  promethazine (PHENERGAN) 25 MG tablet, Take 25 mg by mouth 3 (three) times daily as needed for nausea or vomiting., Disp: , Rfl:  .  propranolol (INDERAL) 10 MG tablet, TAKE 1 TABLET BY MOUTH TWICE A DAY AS NEEDED FOR ANXIETY, Disp: , Rfl: 12 .  [START ON 05/24/2016] rivaroxaban (XARELTO) 20 MG TABS tablet, Take 1 tablet (20 mg total) by mouth daily with supper., Disp: 30 tablet, Rfl: 1 .  rosuvastatin (CRESTOR) 20 MG tablet, Take 20 mg by mouth daily.  , Disp: , Rfl:  .  tobramycin (TOBREX) 0.3 % ophthalmic solution, Place 1 drop into the right eye every 4 (four) hours. X 5 days (Patient taking differently: Place 1 drop into the right eye every 4 (four) hours. Use 4 times daily the 2 days before, the day of, and the 2 days after eye injections), Disp: 5 mL, Rfl: 0 .  UNKNOWN TO PATIENT, Right eye injections every 2 months, Disp: , Rfl:  .  valsartan-hydrochlorothiazide (DIOVAN-HCT) 320-25 MG per tablet, Take 1 tablet by mouth daily. , Disp: , Rfl:  .  verapamil (CALAN-SR) 240 MG CR tablet, Take 240 mg by mouth daily., Disp: , Rfl:  .  VIAGRA 100 MG tablet, , Disp: , Rfl: 1  Allergies: No Known Allergies  Past Medical History,  Surgical history, Social history, and Family History were reviewed and updated.  Review of Systems: As above  Physical Exam:  height is 5' 6"  (1.676 m) and weight is 227 lb (102.967 kg). His oral temperature is 98 F (36.7 C). His blood pressure is 117/72 and his pulse is 80. His respiration is 16.   Wt Readings from Last 3 Encounters:  05/22/16 227 lb (102.967 kg)  04/29/16 240 lb (108.863 kg)  01/13/16 241 lb (109.317 kg)     Obese white male in no obvious distress. Head exam shows no ocular or oral lesions. There are no palpable cervical or supraclavicular lymph nodes. Lungs are clear. Cardiac exam regular rate and rhythm with no murmurs, rubs or bruits. Abdomen is soft. He has good bowel sounds. There is no guarding or rebound tenderness. He has no fluid  wave. There is no palpable abdominal mass. His spleen tip cannot be palpated. Extremities shows no clubbing, cyanosis or edema. Back exam shows no tenderness over the spine, ribs or hips. Skin exam shows no rashes, ecchymoses or petechia. Neurological exam shows no focal neurological deficits.  Lab Results  Component Value Date   WBC 8.1 05/22/2016   HGB 13.1 05/22/2016   HCT 43.3 05/22/2016   MCV 70* 05/22/2016   PLT 363 05/22/2016     Chemistry      Component Value Date/Time   NA 137 05/02/2016 1244   K 4.0 05/02/2016 1244   CL 110 05/02/2016 1244   CO2 21* 05/02/2016 1244   BUN 21* 05/02/2016 1244   CREATININE 1.25* 05/02/2016 1244      Component Value Date/Time   CALCIUM 8.8* 05/02/2016 1244   ALKPHOS 73 04/29/2016 1608   AST 31 04/29/2016 1608   ALT 30 04/29/2016 1608   BILITOT 0.6 04/29/2016 1608         Impression and Plan: James [name] is a 70 year old with polycythemia. We've been very aggressive with control and his blood counts. He had been on baby aspirin. Again, I think that the main reason for this thrombus was is NASH. I'm sure that the polycythemia probably did not help much.  He will need one year of full anticoagulation. I think this would be reasonable.  I don't think we have to do any hypercoagulable studies. No one in the family has had blood clots.  When I see him back in 3 months, I will then repeat a Doppler of his abdomen to see if the thrombus has resolved. I will like to think that it has.  He probably needs to see gastroenterology because of the NASH.  He has lost weight which is a good thing for him.  I spent about 40 minutes with he and his wife.   [name], MD 6/5/20171:07 PM

## 2016-05-23 LAB — D-DIMER, QUANTITATIVE: D-DIMER: 4.13 mg/L FEU — ABNORMAL HIGH (ref 0.00–0.49)

## 2016-06-13 ENCOUNTER — Telehealth: Payer: Self-pay | Admitting: Gastroenterology

## 2016-06-13 NOTE — Telephone Encounter (Signed)
Scheduled with Alonza Bogus, PA on 06/22/16 at 10 AM. Patient needs to be called.

## 2016-06-13 NOTE — Telephone Encounter (Signed)
Patient is aware of appointment

## 2016-06-19 ENCOUNTER — Encounter: Payer: Self-pay | Admitting: *Deleted

## 2016-06-22 ENCOUNTER — Other Ambulatory Visit (INDEPENDENT_AMBULATORY_CARE_PROVIDER_SITE_OTHER): Payer: PRIVATE HEALTH INSURANCE

## 2016-06-22 ENCOUNTER — Ambulatory Visit (INDEPENDENT_AMBULATORY_CARE_PROVIDER_SITE_OTHER): Payer: PRIVATE HEALTH INSURANCE | Admitting: Gastroenterology

## 2016-06-22 ENCOUNTER — Encounter: Payer: Self-pay | Admitting: Gastroenterology

## 2016-06-22 VITALS — BP 128/70 | HR 68 | Ht 66.0 in | Wt 228.0 lb

## 2016-06-22 DIAGNOSIS — I81 Portal vein thrombosis: Secondary | ICD-10-CM | POA: Diagnosis not present

## 2016-06-22 DIAGNOSIS — K7400 Hepatic fibrosis, unspecified: Secondary | ICD-10-CM

## 2016-06-22 DIAGNOSIS — R932 Abnormal findings on diagnostic imaging of liver and biliary tract: Secondary | ICD-10-CM | POA: Diagnosis not present

## 2016-06-22 LAB — IGA: IGA: 177 mg/dL (ref 68–378)

## 2016-06-22 NOTE — Progress Notes (Signed)
     06/22/2016 James [name]. 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 Also noted to have coarsened hepatic echotexture on imaging, question cirrhosis. No previous abdominal imaging in the past. Thrombosis thought to be secondary to polycythemia vera in combination with testosterone use and possible liver disease. Platelets are normal. INR 1.2. Denies alcohol use. Has significant central obesity. He is having a repeat ultrasound with Doppler in August.  Is on Xarelto for now.  He has lost 30 pounds and I have encouraged him to continue the dietary and weight loss.  Known to Dr. Havery Moros for colonoscopy in December 2016.  Current Medications, Allergies, Past Medical History, Past Surgical History, Family History and Social History were reviewed in [employer] record.   Physical Exam: BP 128/70 mmHg  Pulse 68  Ht 5' 6"  (1.676 m)  Wt 228 lb (103.42 kg)  BMI 36.82 kg/m2 General: Well developed white male in no acute distress Head: Normocephalic and atraumatic Eyes:  Sclerae anicteric, conjunctiva pink  Ears: Normal auditory acuity Lungs: Clear throughout to auscultation Heart: Regular rate and rhythm Abdomen: Soft, central obesity noted, non-distended.  Normal bowel sounds.  Non-tender. Musculoskeletal: Symmetrical with no gross deformities  Extremities: No edema  Neurological: Alert oriented x 4, grossly non-focal Psychological:  Alert and cooperative. 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 Also noted to have coarsened hepatic echotexture on imaging, question cirrhosis. No previous  abdominal imaging in the past. Thrombosis thought to be secondary to polycythemia vera in combination with testosterone use and possible liver disease. Platelets are normal. INR 1.2. Denies alcohol use. Has significant central obesity so likely has James [name]. He is having a repeat ultrasound with Doppler in August the thromboses so we will add elastography to that as well. I will check liver serologies including autoimmune and viral hepatitis studies. He has lost 30 pounds and I have encouraged him to continue the dietary and weight loss.

## 2016-06-22 NOTE — Progress Notes (Signed)
Agree with assessment and plan. We will await [place] with elastography and serologies. Pending these results we may need to consider a liver biopsy.

## 2016-06-22 NOTE — Patient Instructions (Addendum)
Please go to the basement level to have your labs drawn.   You have been scheduled for an abdominal ultrasound doppler/ [place] Elastography at [facility] (1st floor of hospital) on 07-26-2016 at 9:30 am Please arrive at 9:15 minutes prior to your appointment for registration. Make certain not to have anything to eat or drink after midnight to your appointment. Should you need to reschedule your appointment, please contact radiology at [phone]. This test typically takes about 30 minutes to perform.       If you are age 57 or older, your body mass index should be between 23-30. Your Body mass index is 36.82 kg/(m^2). If this is out of the aforementioned range listed, please consider follow up with your Primary Care Provider.  James [name]

## 2016-06-23 LAB — HEPATITIS A ANTIBODY, TOTAL: HEP A TOTAL AB: NONREACTIVE

## 2016-06-23 LAB — HEPATITIS B SURFACE ANTIBODY,QUALITATIVE: Hep B S Ab: NEGATIVE

## 2016-06-23 LAB — TISSUE TRANSGLUTAMINASE, IGG: TISSUE TRANSGLUT AB: 1 U/mL (ref <6)

## 2016-06-23 LAB — ANTI-NUCLEAR AB-TITER (ANA TITER)

## 2016-06-23 LAB — ANA: ANA: POSITIVE — AB

## 2016-06-23 LAB — HEPATITIS C ANTIBODY: HCV Ab: NEGATIVE

## 2016-06-23 LAB — HEPATITIS B SURFACE ANTIGEN: HEP B S AG: NEGATIVE

## 2016-06-26 LAB — ANTI-SMOOTH MUSCLE ANTIBODY, IGG: Smooth Muscle Ab: <20 U (ref <20)

## 2016-06-27 NOTE — Progress Notes (Signed)
HPI: FU dyspnea. Echocardiogram May 2011 showed normal LV function, moderate left ventricular hypertrophy, impaired relaxation, mild LVOT gradient. Cardiac catheterization June 2011 showed normal LV function and no obstructive coronary disease. Nuclear study January 2017 showed ejection fraction 58%, artifact but no ischemia. Patient had portal vein thrombosis and splenic infarct May 2017. He is now on anticoagulation. He is followed by hematology oncology for polycythemia. Since last seen, the patient denies any dyspnea on exertion, orthopnea, PND, pedal edema, palpitations, syncope or chest pain.   Current Outpatient Prescriptions  Medication Sig Dispense Refill  . CVS B-12 500 MCG SUBL Place 1 tablet under the tongue daily.   12  . desmopressin (DDAVP) 0.2 MG tablet Take 0.4 mg by mouth at bedtime.     [name] glimepiride (AMARYL) 2 MG tablet Take 2-4 mg by mouth 2 (two) times daily. Take 43ms in the morning and 248m at night    . metFORMIN (GLUCOPHAGE-XR) 500 MG 24 hr tablet Take 500 mg by mouth daily with breakfast.    . omeprazole (PRILOSEC) 20 MG capsule Take 20 mg by mouth daily.      . ONE TOUCH ULTRA TEST test strip by Other route as needed.     . rivaroxaban (XARELTO) 20 MG TABS tablet Take 1 tablet (20 mg total) by mouth daily with supper. 30 tablet 1  . rosuvastatin (CRESTOR) 20 MG tablet Take 20 mg by mouth daily.      . Marland Kitchenobramycin (TOBREX) 0.3 % ophthalmic solution Place 1 drop into the right eye every 4 (four) hours. X 5 days (Patient taking differently: Place 1 drop into the right eye every 4 (four) hours. Use 4 times daily the 2 days before, the day of, and the 2 days after eye injections) 5 mL 0  . UNKNOWN TO PATIENT Right eye injections every 2 months    . valsartan-hydrochlorothiazide (DIOVAN-HCT) 320-25 MG per tablet Take 1 tablet by mouth daily.     . verapamil (CALAN-SR) 240 MG CR tablet Take 240 mg by mouth daily.    . Marland KitchenIAGRA 100 MG tablet   1   No current  facility-administered medications for this visit.     Past Medical History  Diagnosis Date  . Obesity   . HTN (hypertension)   . DM (diabetes mellitus) (HCWilson  . GERD (gastroesophageal reflux disease)   . Hiatal hernia   . Insomnia     resolved by using CPAP  . Rhinitis   . Hyperlipidemia   . Situational depression   . Nocturia more than twice per night 11/11/2014    For 6 month, 5 nocturias a night.   . Sleep apnea     uses CPAP every night  . History of shingles 04/03/2014  . Polycythemia vera(238.4)     History  . Macular degeneration   . Liver cirrhosis secondary to NASH 05/03/2016  . Tubular adenoma 12/10/2015    6 cecum polyps    Past Surgical History  Procedure Laterality Date  . Cardiac catheterization      greater 10 yrs ago, normal  . Total knee arthroplasty Right 2010  . Rotator cuff repair      right  . Carpal tunnel release      bilateral  . Tonsillectomy    . Tendon repair      left arm  . Wisdom tooth extraction    . [facility]  [facility]  Social History  Social History  . Marital Status: Married    Spouse Name: N/A  . Number of Children: 1  . Years of Education: N/A   Occupational History  . Not on file.   Social History Main Topics  . Smoking status: Never Smoker   . Smokeless tobacco: Never Used     Comment: never used tobacco  . Alcohol Use: 0.0 oz/week    0 Standard drinks or equivalent per week     Comment: rarely   . Drug Use: No  . Sexual Activity: Not on file   Other Topics Concern  . Not on file   Social History Narrative   Right handed.  Caffeine 3 cups daily,  Married 2 kids (one deceased).  College grad.  [employer].     Family History  Problem Relation Age of Onset  . Colon cancer    . Heart failure    . Heart attack    . Lung disease Father     history  . Diabetes Mother     history  . Stroke Mother     history  . Colon polyps Neg Hx   . Rectal cancer  Neg Hx   . Stomach cancer Neg Hx   . Esophageal cancer Neg Hx     ROS: no fevers or chills, productive cough, hemoptysis, dysphasia, odynophagia, melena, hematochezia, dysuria, hematuria, rash, seizure activity, orthopnea, PND, pedal edema, claudication. Remaining systems are negative.  Physical Exam: Well-developed obese in no acute distress.  Skin is warm and dry.  HEENT is normal.  Neck is supple.  Chest is clear to auscultation with normal expansion.  Cardiovascular exam is regular rate and rhythm.  Abdominal exam nontender or distended. No masses palpated. Extremities show no edema. neuro grossly intact  ECG-Sinus rhythm at a rate of 81. Nonspecific ST changes.  Assessment and plan  1 Hyperlipidemia-continue statin.  2 hypertension-blood pressure controlled. Continue present medications.  3 Portal vein thrombosis-Continue anticoagulation. Followed by hematology oncology.  4 dyspnea-previous workup negative. Symptoms resolved. No further WU.  Kirk Ruths, MD

## 2016-06-29 ENCOUNTER — Ambulatory Visit (INDEPENDENT_AMBULATORY_CARE_PROVIDER_SITE_OTHER): Payer: PRIVATE HEALTH INSURANCE | Admitting: Cardiology

## 2016-06-29 ENCOUNTER — Encounter: Payer: Self-pay | Admitting: Cardiology

## 2016-06-29 VITALS — BP 108/66 | HR 81 | Ht 66.0 in | Wt 225.0 lb

## 2016-06-29 DIAGNOSIS — R0600 Dyspnea, unspecified: Secondary | ICD-10-CM | POA: Diagnosis not present

## 2016-06-29 DIAGNOSIS — I10 Essential (primary) hypertension: Secondary | ICD-10-CM

## 2016-06-29 DIAGNOSIS — E785 Hyperlipidemia, unspecified: Secondary | ICD-10-CM | POA: Diagnosis not present

## 2016-06-29 NOTE — Patient Instructions (Signed)
Your physician recommends that you schedule a follow-up appointment AS NEEDED.

## 2016-07-26 ENCOUNTER — Ambulatory Visit: Payer: PRIVATE HEALTH INSURANCE | Admitting: Hematology & Oncology

## 2016-07-26 ENCOUNTER — Other Ambulatory Visit (HOSPITAL_BASED_OUTPATIENT_CLINIC_OR_DEPARTMENT_OTHER): Payer: PRIVATE HEALTH INSURANCE

## 2016-07-26 ENCOUNTER — Other Ambulatory Visit: Payer: PRIVATE HEALTH INSURANCE

## 2016-07-26 ENCOUNTER — Ambulatory Visit (HOSPITAL_COMMUNITY): Payer: PRIVATE HEALTH INSURANCE

## 2016-07-28 ENCOUNTER — Other Ambulatory Visit: Payer: PRIVATE HEALTH INSURANCE

## 2016-07-28 ENCOUNTER — Ambulatory Visit: Payer: PRIVATE HEALTH INSURANCE | Admitting: Hematology & Oncology

## 2016-08-08 ENCOUNTER — Ambulatory Visit (HOSPITAL_COMMUNITY)
Admission: RE | Admit: 2016-08-08 | Discharge: 2016-08-08 | Disposition: A | Discharge status: Home / Self Care | Payer: 59 | Source: Ambulatory Visit | Attending: Gastroenterology | Admitting: Gastroenterology

## 2016-08-08 ENCOUNTER — Ambulatory Visit (HOSPITAL_COMMUNITY)
Admission: RE | Admit: 2016-08-08 | Discharge: 2016-08-08 | Disposition: A | Discharge status: Home / Self Care | Payer: 59 | Source: Ambulatory Visit | Attending: Hematology & Oncology | Admitting: Hematology & Oncology

## 2016-08-08 DIAGNOSIS — K7400 Hepatic fibrosis, unspecified: Secondary | ICD-10-CM

## 2016-08-08 DIAGNOSIS — D735 Infarction of spleen: Secondary | ICD-10-CM

## 2016-08-08 DIAGNOSIS — I81 Portal vein thrombosis: Secondary | ICD-10-CM

## 2016-08-08 DIAGNOSIS — K769 Liver disease, unspecified: Secondary | ICD-10-CM | POA: Diagnosis not present

## 2016-08-08 DIAGNOSIS — R932 Abnormal findings on diagnostic imaging of liver and biliary tract: Secondary | ICD-10-CM | POA: Diagnosis not present

## 2016-08-10 ENCOUNTER — Other Ambulatory Visit: Payer: PRIVATE HEALTH INSURANCE

## 2016-08-10 ENCOUNTER — Ambulatory Visit: Payer: PRIVATE HEALTH INSURANCE | Admitting: Hematology & Oncology

## 2016-08-11 ENCOUNTER — Other Ambulatory Visit (HOSPITAL_BASED_OUTPATIENT_CLINIC_OR_DEPARTMENT_OTHER): Payer: 59

## 2016-08-11 ENCOUNTER — Encounter: Payer: Self-pay | Admitting: Lab

## 2016-08-11 ENCOUNTER — Encounter: Payer: Self-pay | Admitting: Hematology & Oncology

## 2016-08-11 ENCOUNTER — Ambulatory Visit (HOSPITAL_BASED_OUTPATIENT_CLINIC_OR_DEPARTMENT_OTHER): Payer: PRIVATE HEALTH INSURANCE | Admitting: Hematology & Oncology

## 2016-08-11 VITALS — BP 104/60 | HR 79 | Temp 98.0°F | Resp 20 | Ht 66.0 in | Wt 227.0 lb

## 2016-08-11 DIAGNOSIS — K7581 Nonalcoholic steatohepatitis (NASH): Secondary | ICD-10-CM | POA: Diagnosis not present

## 2016-08-11 DIAGNOSIS — K769 Liver disease, unspecified: Secondary | ICD-10-CM | POA: Diagnosis not present

## 2016-08-11 DIAGNOSIS — K7460 Unspecified cirrhosis of liver: Secondary | ICD-10-CM

## 2016-08-11 DIAGNOSIS — D45 Polycythemia vera: Secondary | ICD-10-CM

## 2016-08-11 DIAGNOSIS — I81 Portal vein thrombosis: Secondary | ICD-10-CM

## 2016-08-11 DIAGNOSIS — D735 Infarction of spleen: Secondary | ICD-10-CM

## 2016-08-11 DIAGNOSIS — D751 Secondary polycythemia: Secondary | ICD-10-CM

## 2016-08-11 DIAGNOSIS — R160 Hepatomegaly, not elsewhere classified: Secondary | ICD-10-CM

## 2016-08-11 LAB — COMPREHENSIVE METABOLIC PANEL
ALBUMIN: 3.9 g/dL (ref 3.5–5.0)
ALK PHOS: 83 U/L (ref 40–150)
ALT: 21 U/L (ref 0–55)
AST: 23 U/L (ref 5–34)
Anion Gap: 9 mEq/L (ref 3–11)
BUN: 21.5 mg/dL (ref 7.0–26.0)
CHLORIDE: 109 meq/L (ref 98–109)
CO2: 20 mEq/L — ABNORMAL LOW (ref 22–29)
Calcium: 10.6 mg/dL — ABNORMAL HIGH (ref 8.4–10.4)
Creatinine: 1.3 mg/dL (ref 0.7–1.3)
EGFR: 56 mL/min/{1.73_m2} — AB (ref >90)
GLUCOSE: 100 mg/dL (ref 70–140)
POTASSIUM: 4.1 meq/L (ref 3.5–5.1)
SODIUM: 138 meq/L (ref 136–145)
Total Bilirubin: 0.46 mg/dL (ref 0.20–1.20)
Total Protein: 7.1 g/dL (ref 6.4–8.3)

## 2016-08-11 LAB — CBC WITH DIFFERENTIAL (CANCER CENTER ONLY)
BASO#: 0 10*3/uL (ref 0.0–0.2)
BASO%: 0.6 % (ref 0.0–2.0)
EOS ABS: 0.2 10*3/uL (ref 0.0–0.5)
EOS%: 2.4 % (ref 0.0–7.0)
HCT: 36.4 % — ABNORMAL LOW (ref 38.7–49.9)
HEMOGLOBIN: 11.5 g/dL — AB (ref 13.0–17.1)
LYMPH#: 1.2 10*3/uL (ref 0.9–3.3)
LYMPH%: 17 % (ref 14.0–48.0)
MCH: 22 pg — AB (ref 28.0–33.4)
MCHC: 31.6 g/dL — AB (ref 32.0–35.9)
MCV: 70 fL — ABNORMAL LOW (ref 82–98)
MONO#: 1 10*3/uL — ABNORMAL HIGH (ref 0.1–0.9)
MONO%: 13.9 % — AB (ref 0.0–13.0)
NEUT#: 4.7 10*3/uL (ref 1.5–6.5)
NEUT%: 66.1 % (ref 40.0–80.0)
PLATELETS: 242 10*3/uL (ref 145–400)
RBC: 5.23 10*6/uL (ref 4.20–5.70)
RDW: 18.4 % — AB (ref 11.1–15.7)
WBC: 7.1 10*3/uL (ref 4.0–10.0)

## 2016-08-11 NOTE — Progress Notes (Signed)
Hematology and Oncology Follow Up Visit  James [name] [id number] Sep 09, 1946 70 y.o. 08/11/2016   Principle Diagnosis:   Portal vein thrombus  Polycythemia vera  NASH with splenomegaly  Current Therapy:    Xarelto 20 mg by mouth daily-to be completed on 04/2017  Phlebotomy to maintain hematocrit less than 45%     Interim History:  James [name] is back for follow-up. He looks quite good. He's losing some weight. I think he's lost about 20 pounds over the past couple months.  We did go ahead and repeat a angiogram of his abdomen. This was done the ultrasound. There is no portal vein thrombus or splenic vein thrombus.  He is on Xarelto. He's doing well with Xarelto. I will James [name] and to be on Xarelto for 1 year total. He will complete this in May 2018.   He was seen by gastroenterology. They ordered an ultrasound elastography.  I'm not sure exactly what the results mien. So like he has cirrhosis and fibrosis. I will defer to gastroenterology as whether not they want to further pursue this.  He's had no problems with bleeding. He's had no abdominal pain. He's had no cough or shortness of breath. He's had no change in bowel or bladder habits. He's had no leg swelling.  Overall, his performance status is ECOG 1.  Medications:  Current Outpatient Prescriptions:  .  CVS B-12 500 MCG SUBL, Place 1 tablet under the tongue daily. , Disp: , Rfl: 12 .  desmopressin (DDAVP) 0.2 MG tablet, Take 0.4 mg by mouth at bedtime. , Disp: , Rfl:  .  glimepiride (AMARYL) 2 MG tablet, Take 2-4 mg by mouth 2 (two) times daily. Take 61ms in the morning and 229m at night, Disp: , Rfl:  .  metFORMIN (GLUCOPHAGE-XR) 500 MG 24 hr tablet, Take 500 mg by mouth daily with breakfast., Disp: , Rfl:  .  omeprazole (PRILOSEC) 20 MG capsule, Take 20 mg by mouth daily.  , Disp: , Rfl:  .  ONE TOUCH ULTRA TEST test strip, by Other route as needed. , Disp: , Rfl:  .  rivaroxaban (XARELTO) 20 MG TABS tablet, Take 1  tablet (20 mg total) by mouth daily with supper., Disp: 30 tablet, Rfl: 1 .  rosuvastatin (CRESTOR) 20 MG tablet, Take 20 mg by mouth daily.  , Disp: , Rfl:  .  tobramycin (TOBREX) 0.3 % ophthalmic solution, Place 1 drop into the right eye every 4 (four) hours. X 5 days (Patient taking differently: Place 1 drop into the right eye every 4 (four) hours. Use 4 times daily the 2 days before, the day of, and the 2 days after eye injections), Disp: 5 mL, Rfl: 0 .  UNKNOWN TO PATIENT, Right eye injections every 2 months, Disp: , Rfl:  .  valsartan-hydrochlorothiazide (DIOVAN-HCT) 320-25 MG per tablet, Take 1 tablet by mouth daily. , Disp: , Rfl:  .  verapamil (CALAN-SR) 240 MG CR tablet, Take 240 mg by mouth daily., Disp: , Rfl:  .  VIAGRA 100 MG tablet, , Disp: , Rfl: 1  Allergies: No Known Allergies  Past Medical History, Surgical history, Social history, and Family History were reviewed and updated.  Review of Systems: As above  Physical Exam:  height is 5' 6"  (1.676 m) and weight is 227 lb (103 kg). His oral temperature is 98 F (36.7 C). His blood pressure is 104/60 and his pulse is 79. His respiration is 20.   Wt Readings from Last 3 Encounters:  08/11/16 227 lb (103 kg)  06/29/16 225 lb (102.1 kg)  06/22/16 228 lb (103.4 kg)     Obese white male in no obvious distress. Head exam shows no ocular or oral lesions. There are no palpable cervical or supraclavicular lymph nodes. Lungs are clear. Cardiac exam regular rate and rhythm with no murmurs, rubs or bruits. Abdomen is soft. He has good bowel sounds. There is no guarding or rebound tenderness. He has no fluid wave. There is no palpable abdominal mass. His spleen tip cannot be palpated. Extremities shows no clubbing, cyanosis or edema. Back exam shows no tenderness over the spine, ribs or hips. Skin exam shows no rashes, ecchymoses or petechia. Neurological exam shows no focal neurological deficits.  Lab Results  Component Value Date    WBC 7.1 08/11/2016   HGB 11.5 (L) 08/11/2016   HCT 36.4 (L) 08/11/2016   MCV 70 (L) 08/11/2016   PLT 242 08/11/2016     Chemistry      Component Value Date/Time   NA 138 08/11/2016 1423   K 4.1 08/11/2016 1423   CL 110 05/02/2016 1244   CO2 20 (L) 08/11/2016 1423   BUN 21.5 08/11/2016 1423   CREATININE 1.3 08/11/2016 1423      Component Value Date/Time   CALCIUM 10.6 (H) 08/11/2016 1423   ALKPHOS 83 08/11/2016 1423   AST 23 08/11/2016 1423   ALT 21 08/11/2016 1423   BILITOT 0.46 08/11/2016 1423         Impression and Plan: James [name] is a 70 year old with polycythemia. We've been very aggressive with control and his blood counts. He had been on baby aspirin. Again, I think that the main reason for this thrombus was is NASH. I'm sure that the polycythemia probably did not help much.  Thankfully, the thrombus is resolved. I'm happy about this.   I am not sure as to what to do with his liver. Again, there is a 1.6 and a meter lesions in the liver. I would not think this would be hepatocellular carcinoma. However, we have to get an MRI to better identify this.   From my point of view, I think we probably get him back in 2 or 3 months. Again, we will let gastroenterology deal with the NASH and whether or not they want to do further studies with this. I very much appreciate their help and their input.    [name], MD 8/25/20174:15 PM

## 2016-08-23 ENCOUNTER — Ambulatory Visit (HOSPITAL_COMMUNITY)
Admission: RE | Admit: 2016-08-23 | Discharge: 2016-08-23 | Disposition: A | Discharge status: Home / Self Care | Payer: 59 | Source: Ambulatory Visit | Attending: Hematology & Oncology | Admitting: Hematology & Oncology

## 2016-08-23 DIAGNOSIS — K769 Liver disease, unspecified: Secondary | ICD-10-CM | POA: Diagnosis not present

## 2016-08-23 DIAGNOSIS — I81 Portal vein thrombosis: Secondary | ICD-10-CM | POA: Insufficient documentation

## 2016-08-23 DIAGNOSIS — K7460 Unspecified cirrhosis of liver: Secondary | ICD-10-CM

## 2016-08-23 DIAGNOSIS — D45 Polycythemia vera: Secondary | ICD-10-CM | POA: Insufficient documentation

## 2016-08-23 DIAGNOSIS — K7581 Nonalcoholic steatohepatitis (NASH): Secondary | ICD-10-CM | POA: Insufficient documentation

## 2016-08-23 DIAGNOSIS — R160 Hepatomegaly, not elsewhere classified: Secondary | ICD-10-CM | POA: Diagnosis not present

## 2016-08-23 MED ORDER — GADOXETATE DISODIUM 0.25 MMOL/ML IV SOLN
10.0000 mL | Freq: Once | INTRAVENOUS | Status: AC
  Administered 2016-08-23: 10 mL via INTRAVENOUS

## 2016-08-24 ENCOUNTER — Telehealth: Payer: Self-pay

## 2016-08-24 NOTE — Telephone Encounter (Addendum)
-----   Message from [name], MD sent at 08/23/2016  4:39 PM EDT ----- Call - MRI of the liver doe NOT show any cancer!!!!  It looks like a liver cyst!!  Need a repeat MRI in 6 months.  Laurey Arrow   This message left on personalized VM with instructions to call with questions. dph

## 2016-09-07 ENCOUNTER — Other Ambulatory Visit: Payer: PRIVATE HEALTH INSURANCE

## 2016-09-07 ENCOUNTER — Ambulatory Visit: Payer: PRIVATE HEALTH INSURANCE | Admitting: Hematology & Oncology

## 2016-09-26 ENCOUNTER — Encounter: Payer: Self-pay | Admitting: Nurse Practitioner

## 2016-09-26 NOTE — Progress Notes (Signed)
Patient called and stated he was prescribed Duexis by his orthopedic for hip bursitis. Unfortunately he had a few episodes of dark colored stools after taking this medication. He was advised to stop taking medication until he heard back from his orthopedist. He contacted office and stated they suggested he also stop the medication and for him to call and see if Dr. [name] would prescribe or recommend something for his bursitis. Unfortunately patient was advised that he should contact the orthopedist for further evaluation, however Dr. [name] also recommended he follow-up with his GI provider for melena evaluation given his history. Patient verbalized understanding of instructions and stated he would contact his orthopedic again first.

## 2016-09-27 ENCOUNTER — Encounter (HOSPITAL_COMMUNITY): Payer: Self-pay

## 2016-09-27 ENCOUNTER — Ambulatory Visit (INDEPENDENT_AMBULATORY_CARE_PROVIDER_SITE_OTHER): Payer: 59 | Admitting: Adult Health

## 2016-09-27 ENCOUNTER — Telehealth: Payer: Self-pay | Admitting: Gastroenterology

## 2016-09-27 ENCOUNTER — Observation Stay (HOSPITAL_COMMUNITY)
Admission: EM | Admit: 2016-09-27 | Discharge: 2016-09-29 | Disposition: A | Discharge status: Home / Self Care | Payer: 59 | Attending: Internal Medicine | Admitting: Internal Medicine

## 2016-09-27 ENCOUNTER — Encounter: Payer: Self-pay | Admitting: Adult Health

## 2016-09-27 ENCOUNTER — Other Ambulatory Visit: Payer: Self-pay

## 2016-09-27 ENCOUNTER — Other Ambulatory Visit (INDEPENDENT_AMBULATORY_CARE_PROVIDER_SITE_OTHER): Payer: PRIVATE HEALTH INSURANCE

## 2016-09-27 VITALS — BP 129/67 | HR 81 | Resp 20 | Ht 66.0 in | Wt 231.0 lb

## 2016-09-27 DIAGNOSIS — K921 Melena: Secondary | ICD-10-CM | POA: Diagnosis not present

## 2016-09-27 DIAGNOSIS — G4710 Hypersomnia, unspecified: Secondary | ICD-10-CM | POA: Diagnosis not present

## 2016-09-27 DIAGNOSIS — F329 Major depressive disorder, single episode, unspecified: Secondary | ICD-10-CM | POA: Diagnosis not present

## 2016-09-27 DIAGNOSIS — K449 Diaphragmatic hernia without obstruction or gangrene: Secondary | ICD-10-CM | POA: Insufficient documentation

## 2016-09-27 DIAGNOSIS — I81 Portal vein thrombosis: Secondary | ICD-10-CM | POA: Insufficient documentation

## 2016-09-27 DIAGNOSIS — Z9989 Dependence on other enabling machines and devices: Secondary | ICD-10-CM | POA: Diagnosis not present

## 2016-09-27 DIAGNOSIS — G4733 Obstructive sleep apnea (adult) (pediatric): Secondary | ICD-10-CM

## 2016-09-27 DIAGNOSIS — D45 Polycythemia vera: Secondary | ICD-10-CM | POA: Insufficient documentation

## 2016-09-27 DIAGNOSIS — Z7901 Long term (current) use of anticoagulants: Secondary | ICD-10-CM | POA: Insufficient documentation

## 2016-09-27 DIAGNOSIS — K648 Other hemorrhoids: Secondary | ICD-10-CM | POA: Diagnosis not present

## 2016-09-27 DIAGNOSIS — K2961 Other gastritis with bleeding: Principal | ICD-10-CM | POA: Insufficient documentation

## 2016-09-27 DIAGNOSIS — E119 Type 2 diabetes mellitus without complications: Secondary | ICD-10-CM | POA: Diagnosis not present

## 2016-09-27 DIAGNOSIS — Z6836 Body mass index (BMI) 36.0-36.9, adult: Secondary | ICD-10-CM | POA: Insufficient documentation

## 2016-09-27 DIAGNOSIS — K5730 Diverticulosis of large intestine without perforation or abscess without bleeding: Secondary | ICD-10-CM | POA: Diagnosis not present

## 2016-09-27 DIAGNOSIS — I10 Essential (primary) hypertension: Secondary | ICD-10-CM | POA: Diagnosis not present

## 2016-09-27 DIAGNOSIS — K922 Gastrointestinal hemorrhage, unspecified: Secondary | ICD-10-CM | POA: Diagnosis not present

## 2016-09-27 DIAGNOSIS — H3530 Unspecified macular degeneration: Secondary | ICD-10-CM | POA: Insufficient documentation

## 2016-09-27 DIAGNOSIS — K7460 Unspecified cirrhosis of liver: Secondary | ICD-10-CM | POA: Insufficient documentation

## 2016-09-27 DIAGNOSIS — E785 Hyperlipidemia, unspecified: Secondary | ICD-10-CM | POA: Diagnosis not present

## 2016-09-27 DIAGNOSIS — K219 Gastro-esophageal reflux disease without esophagitis: Secondary | ICD-10-CM | POA: Diagnosis not present

## 2016-09-27 DIAGNOSIS — D62 Acute posthemorrhagic anemia: Secondary | ICD-10-CM | POA: Insufficient documentation

## 2016-09-27 DIAGNOSIS — G4730 Sleep apnea, unspecified: Secondary | ICD-10-CM

## 2016-09-27 DIAGNOSIS — Z794 Long term (current) use of insulin: Secondary | ICD-10-CM | POA: Diagnosis not present

## 2016-09-27 DIAGNOSIS — K7581 Nonalcoholic steatohepatitis (NASH): Secondary | ICD-10-CM | POA: Diagnosis not present

## 2016-09-27 DIAGNOSIS — K3189 Other diseases of stomach and duodenum: Secondary | ICD-10-CM | POA: Diagnosis not present

## 2016-09-27 DIAGNOSIS — G4700 Insomnia, unspecified: Secondary | ICD-10-CM | POA: Diagnosis not present

## 2016-09-27 LAB — CBC
HCT: 32.6 % — ABNORMAL LOW (ref 39.0–52.0)
Hemoglobin: 9.9 g/dL — ABNORMAL LOW (ref 13.0–17.0)
MCH: 20.6 pg — AB (ref 26.0–34.0)
MCHC: 30.4 g/dL (ref 30.0–36.0)
MCV: 67.8 fL — ABNORMAL LOW (ref 78.0–100.0)
PLATELETS: 335 10*3/uL (ref 150–400)
RBC: 4.81 MIL/uL (ref 4.22–5.81)
RDW: 18.7 % — AB (ref 11.5–15.5)
WBC: 10.4 10*3/uL (ref 4.0–10.5)

## 2016-09-27 LAB — I-STAT CHEM 8, ED
BUN: 32 mg/dL — AB (ref 6–20)
CALCIUM ION: 1.36 mmol/L (ref 1.15–1.40)
CHLORIDE: 106 mmol/L (ref 101–111)
CREATININE: 1.2 mg/dL (ref 0.61–1.24)
GLUCOSE: 88 mg/dL (ref 65–99)
HCT: 33 % — ABNORMAL LOW (ref 39.0–52.0)
Hemoglobin: 11.2 g/dL — ABNORMAL LOW (ref 13.0–17.0)
Potassium: 3.9 mmol/L (ref 3.5–5.1)
SODIUM: 138 mmol/L (ref 135–145)
TCO2: 24 mmol/L (ref 0–100)

## 2016-09-27 LAB — TYPE AND SCREEN
ABO/RH(D): O POS
Antibody Screen: NEGATIVE

## 2016-09-27 LAB — CBC WITH DIFFERENTIAL/PLATELET
Basophils Absolute: 0 10*3/uL (ref 0.0–0.1)
Basophils Relative: 0.5 % (ref 0.0–3.0)
EOS ABS: 0.1 10*3/uL (ref 0.0–0.7)
EOS PCT: 1.3 % (ref 0.0–5.0)
HEMATOCRIT: 29 % — AB (ref 39.0–52.0)
HEMOGLOBIN: 9.1 g/dL — AB (ref 13.0–17.0)
LYMPHS PCT: 12.8 % (ref 12.0–46.0)
Lymphs Abs: 1.1 10*3/uL (ref 0.7–4.0)
MCHC: 31.4 g/dL (ref 30.0–36.0)
MCV: 67 fl — ABNORMAL LOW (ref 78.0–100.0)
MONO ABS: 1.1 10*3/uL — AB (ref 0.1–1.0)
Monocytes Relative: 12.4 % — ABNORMAL HIGH (ref 3.0–12.0)
Neutro Abs: 6.4 10*3/uL (ref 1.4–7.7)
Neutrophils Relative %: 73 % (ref 43.0–77.0)
Platelets: 296 10*3/uL (ref 150.0–400.0)
RBC: 4.33 Mil/uL (ref 4.22–5.81)
RDW: 19.8 % — ABNORMAL HIGH (ref 11.5–15.5)
WBC: 8.8 10*3/uL (ref 4.0–10.5)

## 2016-09-27 LAB — COMPREHENSIVE METABOLIC PANEL
ALBUMIN: 4 g/dL (ref 3.5–5.0)
ALK PHOS: 87 U/L (ref 38–126)
ALT: 23 U/L (ref 17–63)
AST: 24 U/L (ref 15–41)
Anion gap: 7 (ref 5–15)
BILIRUBIN TOTAL: 0.4 mg/dL (ref 0.3–1.2)
BUN: 32 mg/dL — AB (ref 6–20)
CALCIUM: 10.2 mg/dL (ref 8.9–10.3)
CO2: 22 mmol/L (ref 22–32)
CREATININE: 1.13 mg/dL (ref 0.61–1.24)
Chloride: 107 mmol/L (ref 101–111)
GFR calc Af Amer: >60 mL/min (ref >60)
GFR calc non Af Amer: >60 mL/min (ref >60)
GLUCOSE: 92 mg/dL (ref 65–99)
Potassium: 4 mmol/L (ref 3.5–5.1)
SODIUM: 136 mmol/L (ref 135–145)
TOTAL PROTEIN: 7.1 g/dL (ref 6.5–8.1)

## 2016-09-27 LAB — POC OCCULT BLOOD, ED: Fecal Occult Bld: POSITIVE — AB

## 2016-09-27 MED ORDER — VERAPAMIL HCL ER 240 MG PO TBCR
240.0000 mg | EXTENDED_RELEASE_TABLET | Freq: Every day | ORAL | Status: DC
  Administered 2016-09-27 – 2016-09-28 (×2): 240 mg via ORAL
  Filled 2016-09-27 (×3): qty 1

## 2016-09-27 MED ORDER — ROSUVASTATIN CALCIUM 10 MG PO TABS
20.0000 mg | ORAL_TABLET | Freq: Every day | ORAL | Status: DC
  Administered 2016-09-27 – 2016-09-28 (×2): 20 mg via ORAL
  Filled 2016-09-27 (×2): qty 2

## 2016-09-27 MED ORDER — PANTOPRAZOLE SODIUM 40 MG IV SOLR: 40.0000 mg | Freq: Two times a day (BID) | INTRAVENOUS | Status: DC

## 2016-09-27 MED ORDER — OMEPRAZOLE 20 MG PO CPDR: 20.0000 mg | DELAYED_RELEASE_CAPSULE | Freq: Two times a day (BID) | ORAL | 0 refills | Status: DC

## 2016-09-27 MED ORDER — INSULIN ASPART 100 UNIT/ML ~~LOC~~ SOLN
0.0000 [IU] | Freq: Four times a day (QID) | SUBCUTANEOUS | Status: DC
  Administered 2016-09-28: 3 [IU] via SUBCUTANEOUS
  Administered 2016-09-29: 2 [IU] via SUBCUTANEOUS

## 2016-09-27 MED ORDER — ONDANSETRON HCL 4 MG/2ML IJ SOLN: 4.0000 mg | Freq: Four times a day (QID) | INTRAMUSCULAR | Status: DC | PRN

## 2016-09-27 MED ORDER — IRBESARTAN 150 MG PO TABS
300.0000 mg | ORAL_TABLET | Freq: Every day | ORAL | Status: DC
  Administered 2016-09-28: 300 mg via ORAL
  Filled 2016-09-27 (×2): qty 2

## 2016-09-27 MED ORDER — ACETAMINOPHEN 325 MG PO TABS
650.0000 mg | ORAL_TABLET | Freq: Four times a day (QID) | ORAL | Status: DC | PRN
  Administered 2016-09-29: 650 mg via ORAL
  Filled 2016-09-27: qty 2

## 2016-09-27 MED ORDER — DESMOPRESSIN ACETATE 0.1 MG PO TABS
0.4000 mg | ORAL_TABLET | Freq: Every day | ORAL | Status: DC
  Administered 2016-09-27 – 2016-09-28 (×2): 0.4 mg via ORAL
  Filled 2016-09-27 (×2): qty 2

## 2016-09-27 MED ORDER — ONDANSETRON HCL 4 MG PO TABS: 4.0000 mg | ORAL_TABLET | Freq: Four times a day (QID) | ORAL | Status: DC | PRN

## 2016-09-27 MED ORDER — PANTOPRAZOLE SODIUM 40 MG IV SOLR
80.0000 mg | Freq: Once | INTRAVENOUS | Status: AC
  Administered 2016-09-27: 80 mg via INTRAVENOUS
  Filled 2016-09-27: qty 80

## 2016-09-27 MED ORDER — SODIUM CHLORIDE 0.9 % IV BOLUS (SEPSIS)
1000.0000 mL | Freq: Once | INTRAVENOUS | Status: AC
  Administered 2016-09-27: 1000 mL via INTRAVENOUS

## 2016-09-27 MED ORDER — ACETAMINOPHEN 650 MG RE SUPP: 650.0000 mg | Freq: Four times a day (QID) | RECTAL | Status: DC | PRN

## 2016-09-27 MED ORDER — VALSARTAN-HYDROCHLOROTHIAZIDE 320-25 MG PO TABS: 1.0000 | ORAL_TABLET | Freq: Every day | ORAL | Status: DC

## 2016-09-27 MED ORDER — PANTOPRAZOLE SODIUM 40 MG IV SOLR: 40.0000 mg | Freq: Once | INTRAVENOUS | Status: DC

## 2016-09-27 MED ORDER — HYDROCHLOROTHIAZIDE 25 MG PO TABS
25.0000 mg | ORAL_TABLET | Freq: Every day | ORAL | Status: DC
  Administered 2016-09-28: 25 mg via ORAL
  Filled 2016-09-27 (×2): qty 1

## 2016-09-27 MED ORDER — SODIUM CHLORIDE 0.9 % IV SOLN
8.0000 mg/h | INTRAVENOUS | Status: DC
  Administered 2016-09-27 – 2016-09-28 (×2): 8 mg/h via INTRAVENOUS
  Filled 2016-09-27 (×4): qty 80

## 2016-09-27 NOTE — Progress Notes (Signed)
PATIENT: James [name]. DOB: 03-05-1946  REASON FOR VISIT: follow up HISTORY FROM: patient  HISTORY OF PRESENT ILLNESS: HISTORY 10/29/15: James [name]. is a 70 y.o. male ,seen here as a referral from Dr. Philip Aspen  and upon recommendation of his Urologist for a sleep evaluation.  He was recently told by Dr. Sharlett Iles as well as by his urologist that his nocturia is not related to any prostate pathology that he had no trouble emptying his full bladder. Since he is also obese to Dr. Philip Aspen suggested to have a sleep apnea evaluation as a possible cause for the nocturia. The patient reports that he on some nights have restless legs. He often feels not restored or refreshed in the morning feeling like he didn't get enough sleep at night. He has some muscle cramps as well coughing and shortness of breath has been reported daytime fatigue and hearing loss. He has a history of hypertension, diabetes, obesity, and high cholesterol.   It is review of systems he also endorsed fatigue at 30 points the Epworth Sleepiness Scale at 13 points which indicates hypersomnia and the geriatric depression score at one point.   Dr. Sharlett Iles also listed that the patient has hypogonadism, and is stable on testosterone replacements, he was diagnosed with vitamin B12 deficiency pernicious anemia but the level is still considered borderline and he was given or prescribed sublingual vitamin B12 tablets, he has allergic rhinitis which may interfere with CPAP use as needed. His diabetes is controlled on metformin and glimepiride.  He is also on Diovan which contains a diuretic and takes it in the morning. He has peripheral vascular disease but no claudications and suffered in the past from shingles and has postherpetic neuralgia he has polycythemia vera . Problem #1 on his list was insomnia which really seems to be related to nocturia as cause for  inability to maintain sleep. He goes to bed at 8 PM and is asleep by  8.05, he jokes. He falls asleep in church , in cinema and during TV or reading. He wakes at 4.30 and has been meanwhile up 4-5 times. His first nocturia is after 4 hours and then every 60- 90 minutes.   He doesn't nap  at work. 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  James [name] reports that he has a son that also has sleep apnea and is actually depending happily on the use of his CPAP machine.  He lost one son to suicide. His mother and aunts had sleepiness.   01-29-15 James [name] underwent a split night polysomnography on 11-26-14 after he had endorsed the Epworth sleepiness score at an elevated level of 13 points. He has a past medical history including polycythemia vera obesity dyspnea and hypertension and diabetes. In addition he had nocturia more than 3 times at night. He was diagnosed with an AHI of 24.8 and an RDI of 26.5 he slept not on his back but only on his side during the night in the sleep lab he also had very frequent periodic limb movements his PLM arousals were 18.5 per hour. He was titrated to 7 cm CPAP with a reduction of the AHI to 5.4 the now residual apneas were mostly central in nature. His PLM still continued as he was on CPAP. I see him today for his first visit after the sleep study he endorsed today the Epworth sleepiness score at 6 points which is quite reduced in comparison and  his fatigue severity at 19 points. He has been using an O2 sat CPAP machine with a minimum pressure of 5 cm water and a maximum pressure of 12 cm water the 95 st percentile of pressure is 11.2 He is 100% compliance for 30 days and 97% compliance for over 4 hours of use, average time of use of 6 hours 59 minutes EPR level 3 cm water.  I would like for this patient to remain on this AutoSet machine-  his residual AHI is only 1.2 , there are no central apneas. He does have a mild air leak. He uses a nasal covering mask.  He has some pressure marks around the nose and I  suggested to try and nasal pillow. He had not seen one before also the sleep lab staff is supposed to offer several options to the patient. He does like the res med airfit  P 10.I would like to add that the patient still has 3 nocturia is at night in spite of having sufficiently even wonderfully treated.  Interval history from 09-28-15, James [name] is here today for follow-up visit with an excellent compliance report. He has used the machine 100% of the last 30 days over 4 hours with an average user time of 7 hours and 54 minutes. He remains on AUTO set 5-12 cm water with 3 cm EPR. He has minor air leaks his 95th percentile pressure is 11.7. The AHI is 0.9. The patient reports sleeping through the night and having only 2 bathroom breaks down from 3 or 4 before being treated for OSA. He tolerates the CPAP well and his Epworth sleepiness score has decreased to 8 points his fatigue severity 27 points and he does not indicate a significant depression on his geriatric depression scale.  Today 09/27/2016:  James [name] is a 70 year old male with a history of obstructive sleep apnea on CPAP. He returns today for a compliance download. His download indicates that he uses machine 30 out of 30 days for compliance of 100%. Each night he uses machine greater than 4 hours. On average he uses his machine 7 hours and 30 minutes. His residual AHI is 0.6 on a minimum pressure of 5 cm water and maximum pressure of 12 cm water with EPR 3. His leak in the 95th percentile is 16.9 L/m. Overall he feels that he is doing well. He continues to use the fullface mask. His Epworth sleepiness score is 5. He returns today for an evaluation.   REVIEW OF SYSTEMS: Out of a complete 14 system review of symptoms, the patient complains only of the following symptoms, and all other reviewed systems are negative. See history of present illness  ALLERGIES: No Known Allergies  HOME MEDICATIONS: Outpatient Medications Prior to Visit    Medication Sig Dispense Refill  . [facility] B-12 500 MCG SUBL Place 1 tablet under the tongue daily.   12  . desmopressin (DDAVP) 0.2 MG tablet Take 0.4 mg by mouth at bedtime.     [name] glimepiride (AMARYL) 2 MG tablet Take 2-4 mg by mouth 2 (two) times daily. Take 36ms in the morning and 243m at night    . metFORMIN (GLUCOPHAGE-XR) 500 MG 24 hr tablet Take 500 mg by mouth daily with breakfast.    . omeprazole (PRILOSEC) 20 MG capsule Take 20 mg by mouth daily.      . ONE TOUCH ULTRA TEST test strip by Other route as needed.     . rivaroxaban (XARELTO) 20 MG TABS  tablet Take 1 tablet (20 mg total) by mouth daily with supper. 30 tablet 1  . rosuvastatin (CRESTOR) 20 MG tablet Take 20 mg by mouth daily.      [name] tobramycin (TOBREX) 0.3 % ophthalmic solution Place 1 drop into the right eye every 4 (four) hours. X 5 days (Patient taking differently: Place 1 drop into the right eye every 4 (four) hours. Use 4 times daily the 2 days before, the day of, and the 2 days after eye injections) 5 mL 0  . UNKNOWN TO PATIENT Right eye injections every 2 months    . valsartan-hydrochlorothiazide (DIOVAN-HCT) 320-25 MG per tablet Take 1 tablet by mouth daily.     . verapamil (CALAN-SR) 240 MG CR tablet Take 240 mg by mouth daily.    [name] VIAGRA 100 MG tablet   1   No facility-administered medications prior to visit.     PAST MEDICAL HISTORY: Past Medical History:  Diagnosis Date  . DM (diabetes mellitus) ([place])   . GERD (gastroesophageal reflux disease)   . Hiatal hernia   . History of shingles 04/03/2014  . HTN (hypertension)   . Hyperlipidemia   . Insomnia    resolved by using CPAP  . 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 Macular degeneration   . Nocturia more than twice per night 11/11/2014   For 6 month, 5 nocturias a night.   . Obesity   . 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 Rhinitis   . Situational depression   . Sleep apnea    uses CPAP every night  . Tubular adenoma 12/10/2015    6 cecum polyps    PAST SURGICAL HISTORY: Past Surgical History:  Procedure Laterality Date  . CARDIAC CATHETERIZATION     greater 10 yrs ago, normal  . CARPAL TUNNEL RELEASE     bilateral  . COLONOSCOPY  06/2005   [facility] Dr Lajoyce Corners  . ROTATOR CUFF REPAIR     right  . TENDON REPAIR     left arm  . TONSILLECTOMY    . TOTAL KNEE ARTHROPLASTY Right 2010  . WISDOM TOOTH EXTRACTION      FAMILY HISTORY: Family History  Problem Relation Age of Onset  . Colon cancer    . Heart failure    . Heart attack    . Lung disease Father     history  . Diabetes Mother     history  . Stroke Mother     history  . Colon polyps Neg Hx   . Rectal cancer Neg Hx   . Stomach cancer Neg Hx   . Esophageal cancer Neg Hx     SOCIAL HISTORY: Social History   Social History  . Marital status: Married    Spouse name: N/A  . Number of children: 1  . Years of education: N/A   Occupational History  . Not on file.   Social History Main Topics  . Smoking status: Never Smoker  . Smokeless tobacco: Never Used     Comment: never used tobacco  . Alcohol use 0.0 oz/week     Comment: rarely   . Drug use: No  . Sexual activity: Not on file   Other Topics Concern  . Not on file   Social History Narrative   Right handed.  Caffeine 3 cups daily,  Married 2 kids (one deceased).  College grad.  [employer].       PHYSICAL EXAM  Vitals:  09/27/16 0718  BP: 129/67  Pulse: 81  Resp: 20  Weight: 231 lb (104.8 kg)  Height: 5' 6"  (1.676 m)   Body mass index is 37.28 kg/m.  Generalized: Well developed, in no acute distress  Neck: Circumference 16-1/2 inches, Mallampati 3+  Neurological examination  Mentation: Alert oriented to time, place, history taking. Follows all commands speech and language fluent Cranial nerve II-XII: Pupils were equal round reactive to light. Extraocular movements were full, visual field were full on confrontational test. Facial  sensation and strength were normal. Uvula tongue midline. Head turning and shoulder shrug  were normal and symmetric. Motor: The motor testing reveals 5 over 5 strength of all 4 extremities. Good symmetric motor tone is noted throughout.  Sensory: Sensory testing is intact to soft touch on all 4 extremities. No evidence of extinction is noted.  Coordination: Cerebellar testing reveals good finger-nose-finger and heel-to-shin bilaterally.  Gait and station: Gait is normal. Tandem gait is normal. Romberg is negative. No drift is seen.  Reflexes: Deep tendon reflexes are symmetric and normal bilaterally.   DIAGNOSTIC DATA (LABS, IMAGING, TESTING) - I reviewed patient records, labs, notes, testing and imaging myself where available.  Lab Results  Component Value Date   WBC 7.1 08/11/2016   HGB 11.5 (L) 08/11/2016   HCT 36.4 (L) 08/11/2016   MCV 70 (L) 08/11/2016   PLT 242 08/11/2016      Component Value Date/Time   NA 138 08/11/2016 1423   K 4.1 08/11/2016 1423   CL 110 05/02/2016 1244   CO2 20 (L) 08/11/2016 1423   GLUCOSE 100 08/11/2016 1423   BUN 21.5 08/11/2016 1423   CREATININE 1.3 08/11/2016 1423   CALCIUM 10.6 (H) 08/11/2016 1423   PROT 7.1 08/11/2016 1423   ALBUMIN 3.9 08/11/2016 1423   AST 23 08/11/2016 1423   ALT 21 08/11/2016 1423   ALKPHOS 83 08/11/2016 1423   BILITOT 0.46 08/11/2016 1423   GFRNONAA 56 (L) 05/02/2016 1244   GFRAA >60 05/02/2016 1244      ASSESSMENT AND PLAN 70 y.o. year old male  has a past medical history of DM (diabetes mellitus) ([place]); GERD (gastroesophageal reflux disease); Hiatal hernia; History of shingles (04/03/2014); HTN (hypertension); Hyperlipidemia; Insomnia; Liver cirrhosis secondary to NASH ([place]) (05/03/2016); Macular degeneration; Nocturia more than twice per night (11/11/2014); Obesity; Polycythemia vera(238.4); Rhinitis; Situational depression; Sleep apnea; and Tubular adenoma (12/10/2015). here with:  1. Obstructive sleep apnea on  CPAP  Overall the patient is doing well. He has excellent compliance. He is encouraged to continue using the CPAP nightly. He will follow-up in one year with Dr. Brett Fairy.     Ward Givens, MSN, NP-C 09/27/2016, 7:39 AM [facility] [address], [place], [place] [address] [phone]

## 2016-09-27 NOTE — H&P (Signed)
History and Physical  Patient Name: James [name].     URK:[id number]    DOB: 01-14-1946    DOA: 09/27/2016 PCP: [name], MD  GI: Dr. Havery Moros Heme-Onc: Dr. [name]    Patient coming from: Home --> GI clinic --> ER  Chief Complaint: Melena  HPI: James [name]. is a 70 y.o. male with a past medical history significant for cirrhosis from NASH, obesity, Polycythemia vera, HTN, NIDDM, and portal vein thrombosis on Xarelto since May who presents with melena for 3 days.  The patient was in his usual state of health until last week when he developed some shoulder pain, was diagnosed last Friday by Dr. Delfino Lovett with bursitis, and started on ibuprofen plus famotidine.  Then, early this week he noticed some black and soft bowel movements several times.  He eventually got in touch with his GI's office, who did a CBC this afternoon that showed a hemoglobin down to 9.1 g/dL  (from 13 g/dL in June).  He has had no dizziness, shortness of breath, pallor, lightheadedness, syncope.  ED course: -Afebrile, heart rate 70s to 90s, respirations pulse oximetry normal, blood pressure 120/70 -Na 138, K 3.9, BUN 32, Cr 1.2 (baseline 1.0-1.2), WBC 10.4K, Hgb 9.9 on repeat -Review of prior hemoglobin shows that his baseline has been in the range of 13-14 over the last year, although it has been trending down since May -FOBT + -The case was discussed with [facility] on call who recommended Protonix infusion and endoscopy in the morning, and TRH were asked to evaluate for admission    Last phlebotomy for PV was in Dec 2016.  No previous GI bleed.  No alcohol use.  He has not had an EGD since his presumptive cirrhosis diagnosis in May 2017, so it is not known if he has varices.    ROS: Review of Systems  Constitutional: Negative for chills, fever and malaise/fatigue.  Respiratory: Negative for shortness of breath.   Cardiovascular: Negative for chest pain.  Gastrointestinal: Positive for melena.  Negative for abdominal pain, blood in stool, heartburn, nausea and vomiting.  Neurological: Negative for loss of consciousness.  All other systems reviewed and are negative.         Past Medical History:  Diagnosis Date  . DM (diabetes mellitus) ([place])   . GERD (gastroesophageal reflux disease)   . Hiatal hernia   . History of shingles 04/03/2014  . HTN (hypertension)   . Hyperlipidemia   . Insomnia    resolved by using CPAP  . 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 Macular degeneration   . Nocturia more than twice per night 11/11/2014   For 6 month, 5 nocturias a night.   . Obesity   . 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 Rhinitis   . Situational depression   . Sleep apnea    uses CPAP every night  . Tubular adenoma 12/10/2015   6 cecum polyps    Past Surgical History:  Procedure Laterality Date  . CARDIAC CATHETERIZATION     greater 10 yrs ago, normal  . CARPAL TUNNEL RELEASE     bilateral  . COLONOSCOPY  06/2005   [facility] Medical Dr Lajoyce Corners  . ROTATOR CUFF REPAIR     right  . TENDON REPAIR     left arm  . TONSILLECTOMY    . TOTAL KNEE ARTHROPLASTY Right 2010  . WISDOM TOOTH EXTRACTION      Social History: Patient lives in [place].  He is a Engineer, maintenance (IT).  The patient walks unassisted.  He does not smoke. Drinks moderately, none recently.    No Known Allergies  Family history: family history includes Diabetes in his mother; Lung disease in his father; Stroke in his mother.  Prior to Admission medications   Medication Sig Start Date End Date Taking? Authorizing Provider  desmopressin (DDAVP) 0.2 MG tablet Take 0.4 mg by mouth at bedtime.  03/21/14  Yes Historical Provider, MD  glimepiride (AMARYL) 2 MG tablet Take 2-4 mg by mouth 2 (two) times daily. Take 48ms in the morning and 240m at night 03/21/14  Yes Historical Provider, MD  metFORMIN (GLUCOPHAGE-XR) 500 MG 24 hr tablet Take 500 mg by mouth daily with breakfast.   Yes Historical Provider, MD  omeprazole  (PRILOSEC) 20 MG capsule Take 20 mg by mouth daily.     Yes Historical Provider, MD  rivaroxaban (XARELTO) 20 MG TABS tablet Take 1 tablet (20 mg total) by mouth daily with supper. 05/24/16  Yes ViHosie PoissonMD  rosuvastatin (CRESTOR) 20 MG tablet Take 20 mg by mouth daily.     Yes Historical Provider, MD  valsartan-hydrochlorothiazide (DIOVAN-HCT) 320-25 MG per tablet Take 1 tablet by mouth daily.  07/20/13  Yes Historical Provider, MD  verapamil (CALAN-SR) 240 MG CR tablet Take 240 mg by mouth daily.   Yes Historical Provider, MD  CVS B-12 500 MCG SUBL Place 1 tablet under the tongue daily.  09/17/14   Historical Provider, MD  omeprazole (PRILOSEC) 20 MG capsule Take 1 capsule (20 mg total) by mouth 2 (two) times daily. 09/27/16 10/11/16  StManus GunningMD  ONE TOUCH ULTRA TEST test strip by Other route as needed.  08/04/13   Historical Provider, MD  tobramycin (TOBREX) 0.3 % ophthalmic solution Place 1 drop into the right eye every 4 (four) hours. X 5 days Patient taking differently: Place 1 drop into the right eye every 4 (four) hours. Use 4 times daily the 2 days before, the day of, and the 2 days after eye injections 04/08/16   DaJanne NapoleonNP  UNKNOWN TO PATIENT Right eye injections every 2 months    Historical Provider, MD  VIAGRA 100 MG tablet  10/26/14   Historical Provider, MD       Physical Exam: BP 107/62 (BP Location: Left Arm)   Pulse 77   Temp 98.3 F (36.8 C) (Oral)   Resp 17   SpO2 95%  General appearance: Well-developed, obese adult male, alert and in no acute distress.   Eyes: Anicteric, conjunctiva pink, lids and lashes normal. PERRL.    ENT: No nasal deformity, discharge, epistaxis.  Hearing normal. OP moist without lesions.   Neck: No neck masses.  Trachea midline.  No thyromegaly/tenderness. Lymph: No cervical or supraclavicular lymphadenopathy. Skin: Warm and dry.  No jaundice.  No suspicious rashes or lesions. Cardiac: RRR, nl S1-S2, no murmurs appreciated.   Capillary refill is brisk.  JVP not visible.  No LE edema.  Radial and DP pulses 2+ and symmetric. Respiratory: Normal respiratory rate and rhythm.  CTAB without rales or wheezes. Abdomen: Abdomen soft.  No TTP. No ascites, distension, hepatosplenomegaly.   MSK: No deformities or effusions.  No cyanosis or clubbing. Neuro: Cranial nerves normal.  Sensation intact to light touch. Speech is fluent.  Muscle strength normal.    Psych: Sensorium intact and responding to questions, attention normal.  Behavior appropriate.  Affect normal.  Judgment and insight appear normal.     Labs  on Admission:  I have personally reviewed following labs and imaging studies: CBC:  Recent Labs Lab 09/27/16 1455 09/27/16 1844 09/27/16 1859  WBC 8.8 10.4  --   NEUTROABS 6.4  --   --   HGB 9.1* 9.9* 11.2*  HCT 29.0* 32.6* 33.0*  MCV 67.0 Repeated and verified X2.* 67.8*  --   PLT 296.0 335  --    Basic Metabolic Panel:  Recent Labs Lab 09/27/16 1844 09/27/16 1859  NA 136 138  K 4.0 3.9  CL 107 106  CO2 22  --   GLUCOSE 92 88  BUN 32* 32*  CREATININE 1.13 1.20  CALCIUM 10.2  --    GFR: Estimated Creatinine Clearance: 64 mL/min (by C-G formula based on SCr of 1.2 mg/dL).  Liver Function Tests:  Recent Labs Lab 09/27/16 1844  AST 24  ALT 23  ALKPHOS 87  BILITOT 0.4  PROT 7.1  ALBUMIN 4.0   No results for input(s): LIPASE, AMYLASE in the last 168 hours. No results for input(s): AMMONIA in the last 168 hours. Coagulation Profile: No results for input(s): INR, PROTIME in the last 168 hours. Cardiac Enzymes: No results for input(s): CKTOTAL, CKMB, CKMBINDEX, TROPONINI in the last 168 hours. BNP (last 3 results) No results for input(s): PROBNP in the last 8760 hours. HbA1C: No results for input(s): HGBA1C in the last 72 hours. CBG: No results for input(s): GLUCAP in the last 168 hours. Lipid Profile: No results for input(s): CHOL, HDL, LDLCALC, TRIG, CHOLHDL, LDLDIRECT in the last  72 hours. Thyroid Function Tests: No results for input(s): TSH, T4TOTAL, FREET4, T3FREE, THYROIDAB in the last 72 hours. Anemia Panel: No results for input(s): VITAMINB12, FOLATE, FERRITIN, TIBC, IRON, RETICCTPCT in the last 72 hours. Sepsis Labs: Invalid input(s): PROCALCITONIN, LACTICIDVEN No results found for this or any previous visit (from the past 240 hour(s)).           Assessment/Plan  1. Acute GI bleed:  NSAID induced.  Melena this week.  Currently hemodynamically stable and asymptomatic from standpoint of anemia.   -NPO -Ondansetron for nausea -PPI infustion -Consult to gastroenterology, appreciate cares   2. Blood loss anemia on polycythemia vera:  -Trend Hgb overnight -Continue DDAVP for PV  3. OSA on CPAP:  -Continue CPAP  4. HTN:  -Continue valsartan-HCTZ -Continue verapamil -Continue statin  5. Portal vein thrombosis:  Secondary to NASH. -Hold Xarelto  6. ?Cirrhosis with liver mass:  Compensated.  Has liver mass noted on [place] and MRI, which is in workup by GI, anticipating repeat imaging in 3-6 months -Consult to Gastroenterology, appreciate cares  7. NIDDM:  -Hold metformin and glimepiride -SSI q6hrs     DVT prophylaxis: SCDs  Code Status: FULL  Family Communication: None present  Disposition Plan: Anticipate NPO at midnight, PPI infusion and GI consultation with possible EGD tomorrow.  Consults called: GI, Dr. Henrene Pastor spoke with EDP Admission status: OBS, med surg At the point of initial evaluation, it is my clinical opinion that admission for OBSERVATION is reasonable and necessary because the patient's presenting complaints in the context of their chronic conditions represent sufficient risk of deterioration or significant morbidity to constitute reasonable grounds for close observation in the hospital setting, but that the patient may be medically stable for discharge from the hospital within 24 to 48 hours, as he is currently without  tachycardia, confusion, ongoing bleeding, or need for transfusion.    Medical decision making: Patient seen at 7:25 PM on 09/27/2016.  The patient  was discussed with Dr. Tyrone Nine.  What exists of the patient's chart was reviewed in depth and summarized above.  Clinical condition: stable.        Edwin Dada [facility] Pager [phone]

## 2016-09-27 NOTE — Telephone Encounter (Signed)
Patient has had his daily, regular bowel movements, only black and soft. Verbal order from Dr. Havery Moros to check a stat CBC today, patient will double up on Prilosec for next two weeks. If he experiences any light headiness and or based on CBC, he will need to go to ED for evaluation.

## 2016-09-27 NOTE — Patient Instructions (Signed)
CPAP download is Excellent!!! If your symptoms worsen or you develop new symptoms please let us know.

## 2016-09-27 NOTE — ED Triage Notes (Signed)
Pt sent from doctor c/o dark stools over the last 3 days. Pt was recently placed on Duexis by his orthopedic doctor. Pt reports a hx of blood clots in May. PCP sent him here d/t concern for internal bleeding. A&Ox4. Ambulatory.

## 2016-09-27 NOTE — Telephone Encounter (Signed)
Patient states that immediately after starting the Duexis for bursitis, he started having "dark, soft" stools. Denies abdominal pain, blood in stool or fever. He called ortho doctor who advised him to call here, he also spoke to his PCP and was advised to stop taking it. Last dose was Tuesday. Patient wants to know if he should or could continue to take this medication. Please advise.

## 2016-09-27 NOTE — Progress Notes (Signed)
I agree with the assessment and plan as directed by NP .The patient is known to me .   Arvo Ealy, MD  

## 2016-09-27 NOTE — ED Provider Notes (Signed)
[facility] DEPT Provider Note   CSN: [id number] Arrival date & time: 09/27/16  1731     History   Chief Complaint Chief Complaint  Patient presents with  . Melena    sent from doctor- concern for internal bleeding    HPI James [name]. is a 70 y.o. male.  70 yo M with dark and tarry stools, going on for past three days.  Denies cp, sob, near syncope.  Called his GI doctor, they checked a stat hemoglobin which was 2 g lower than about a month and a half ago and told to come directly to the ED. He was recently started on an NSAID for her bursitis by his orthopedic surgeon. He is also on Xarelto for splenic vein thrombosis.   The history is provided by the patient.  Rectal Bleeding  Quality:  Maroon and black and tarry Amount:  Moderate Duration:  3 days Timing:  Constant Chronicity:  New Similar prior episodes: no   Relieved by:  Nothing Worsened by:  Nothing Ineffective treatments:  None tried Associated symptoms: no abdominal pain, no fever and no vomiting   Risk factors: anticoagulant use     Past Medical History:  Diagnosis Date  . DM (diabetes mellitus) ([place])   . GERD (gastroesophageal reflux disease)   . Hiatal hernia   . History of shingles 04/03/2014  . HTN (hypertension)   . Hyperlipidemia   . Insomnia    resolved by using CPAP  . 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 Macular degeneration   . Nocturia more than twice per night 11/11/2014   For 6 month, 5 nocturias a night.   . Obesity   . 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 Rhinitis   . Situational depression   . Sleep apnea    uses CPAP every night  . Tubular adenoma 12/10/2015   6 cecum polyps    Patient Active Problem List   Diagnosis Date Noted  . Acute blood loss anemia 09/27/2016  . Acute upper GI bleed 09/27/2016  . Abnormal liver diagnostic imaging 06/22/2016  . 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 Diabetes mellitus ([place]) 04/29/2016  . Leukocytosis 04/29/2016  . Dyspnea 12/31/2015  . Essential hypertension 12/31/2015  . Hyperlipidemia 12/31/2015  . Nocturia more than twice per night 11/11/2014  . Severe obesity (BMI >= 40) ([place]) 11/11/2014  . 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 Polycythemia vera ([place]) 11/29/2011    Past Surgical History:  Procedure Laterality Date  . CARDIAC CATHETERIZATION     greater 10 yrs ago, normal  . CARPAL TUNNEL RELEASE     bilateral  . COLONOSCOPY  06/2005   [facility] Medical Dr Lajoyce Corners  . ROTATOR CUFF REPAIR     right  . TENDON REPAIR     left arm  . TONSILLECTOMY    . TOTAL KNEE ARTHROPLASTY Right 2010  . WISDOM TOOTH EXTRACTION         Home Medications    Prior to Admission medications   Medication Sig Start Date End Date Taking? Authorizing Provider  [facility] B-12 500 MCG SUBL Place 1 tablet under the tongue daily with breakfast.  09/17/14  Yes Historical Provider, MD  desmopressin (DDAVP) 0.2 MG tablet Take 0.4 mg by mouth at bedtime.  03/21/14  Yes Historical Provider, MD  glimepiride (AMARYL) 2 MG tablet Take 2-4 mg by mouth 2 (two) times  daily. Take 60ms in the morning and 219m at night 03/21/14  Yes Historical Provider, MD  metFORMIN (GLUCOPHAGE-XR) 500 MG 24 hr tablet Take 500 mg by mouth 2 (two) times daily.    Yes Historical Provider, MD  omeprazole (PRILOSEC) 20 MG capsule Take 20 mg by mouth daily.     Yes Historical Provider, MD  omeprazole (PRILOSEC) 20 MG capsule Take 1 capsule (20 mg total) by mouth 2 (two) times daily. 09/27/16 10/11/16 Yes StManus GunningMD  ONE TOUCH ULTRA TEST test strip by Other route as needed.  08/04/13  Yes Historical Provider, MD  PRESCRIPTION MEDICATION every 8 (eight) weeks. Gets injections in right eye at dr's office every 8 weeks   Yes Historical Provider, MD  rivaroxaban (XARELTO) 20 MG TABS tablet Take 1 tablet (20 mg total) by mouth daily with supper. 05/24/16  Yes ViHosie PoissonMD  rosuvastatin (CRESTOR) 20 MG tablet Take 20 mg by mouth at bedtime.    Yes Historical Provider, MD  tobramycin (TOBREX) 0.3 % ophthalmic solution Place 1 drop into the right eye every 4 (four) hours. X 5 days Patient taking differently: Place 1 drop into the right eye every 4 (four) hours. Use 4 times daily the 2 days before, the day of, and the 2 days after eye injections 04/08/16  Yes DaJanne NapoleonNP  Triamcinolone Acetonide (KENALOG IJ) Inject as directed once.   Yes Historical Provider, MD  valsartan-hydrochlorothiazide (DIOVAN-HCT) 320-25 MG per tablet Take 1 tablet by mouth daily with breakfast.  07/20/13  Yes Historical Provider, MD  verapamil (CALAN-SR) 240 MG CR tablet Take 240 mg by mouth daily.   Yes Historical Provider, MD  VIAGRA 100 MG tablet Take 100 mg by mouth daily as needed for erectile dysfunction.  10/26/14  Yes Historical Provider, MD    Family History Family History  Problem Relation Age of Onset  . Colon cancer    . Heart failure    . Heart attack    . Lung disease Father     history  . Diabetes Mother     history  . Stroke Mother     history  . Colon polyps Neg Hx   . Rectal cancer Neg Hx   . Stomach cancer Neg Hx   . Esophageal cancer Neg Hx     Social History Social History  Substance Use Topics  . Smoking status: Never Smoker  . Smokeless tobacco: Never Used     Comment: never used tobacco  . Alcohol use 0.0 oz/week     Comment: rarely      Allergies   Review of patient's allergies indicates no known allergies.   Review of Systems Review of Systems  Constitutional: Negative for chills and fever.  HENT: Negative for congestion and facial swelling.   Eyes: Negative for discharge and visual disturbance.  Respiratory: Negative for shortness of breath.   Cardiovascular: Negative for chest pain and palpitations.  Gastrointestinal: Positive for blood in stool and hematochezia. Negative for abdominal pain, diarrhea, nausea and vomiting.    Musculoskeletal: Negative for arthralgias and myalgias.  Skin: Negative for color change and rash.  Neurological: Negative for tremors, syncope and headaches.  Psychiatric/Behavioral: Negative for confusion and dysphoric mood.     Physical Exam Updated Vital Signs BP 128/79 (BP Location: Left Arm)   Pulse 72   Temp 98.5 F (36.9 C) (Oral)   Resp 20   Ht 5' 6"  (1.676 m)   Wt 227 lb 12.8 oz (  103.3 kg)   SpO2 98%   BMI 36.77 kg/m   Physical Exam  Constitutional: He is oriented to person, place, and time. He appears well-developed and well-nourished.  HENT:  Head: Normocephalic and atraumatic.  Eyes: EOM are normal. Pupils are equal, round, and reactive to light.  Neck: Normal range of motion. Neck supple. No JVD present.  Cardiovascular: Normal rate and regular rhythm.  Exam reveals no gallop and no friction rub.   No murmur heard. Pulmonary/Chest: No respiratory distress. He has no wheezes.  Abdominal: He exhibits no distension and no mass. There is no tenderness. There is no rebound and no guarding.  Genitourinary: Rectal exam shows guaiac positive stool. Rectal exam shows no external hemorrhoid and no internal hemorrhoid.  Musculoskeletal: Normal range of motion.  Neurological: He is alert and oriented to person, place, and time.  Skin: No rash noted. No pallor.  Psychiatric: He has a normal mood and affect. His behavior is normal.  Nursing note and vitals reviewed.    ED Treatments / Results  Labs (all labs ordered are listed, but only abnormal results are displayed) Labs Reviewed  COMPREHENSIVE METABOLIC PANEL - Abnormal; Notable for the following:       Result Value   BUN 32 (*)    All other components within normal limits  CBC - Abnormal; Notable for the following:    Hemoglobin 9.9 (*)    HCT 32.6 (*)    MCV 67.8 (*)    MCH 20.6 (*)    RDW 18.7 (*)    All other components within normal limits  GLUCOSE, CAPILLARY - Abnormal; Notable for the following:     Glucose-Capillary 237 (*)    All other components within normal limits  POC OCCULT BLOOD, ED - Abnormal; Notable for the following:    Fecal Occult Bld POSITIVE (*)    All other components within normal limits  I-STAT CHEM 8, ED - Abnormal; Notable for the following:    BUN 32 (*)    Hemoglobin 11.2 (*)    HCT 33.0 (*)    All other components within normal limits  BASIC METABOLIC PANEL  CBC  TYPE AND SCREEN    EKG  EKG Interpretation None       Radiology No results found.  Procedures Procedures (including critical care time)  Medications Ordered in ED Medications  pantoprazole (PROTONIX) 80 mg in sodium chloride 0.9 % 250 mL (0.32 mg/mL) infusion (8 mg/hr Intravenous Transfusing/Transfer 09/27/16 2031)  insulin aspart (novoLOG) injection 0-15 Units (0 Units Subcutaneous Not Given 09/28/16 0042)  acetaminophen (TYLENOL) tablet 650 mg (not administered)    Or  acetaminophen (TYLENOL) suppository 650 mg (not administered)  ondansetron (ZOFRAN) tablet 4 mg (not administered)    Or  ondansetron (ZOFRAN) injection 4 mg (not administered)  verapamil (CALAN-SR) CR tablet 240 mg (240 mg Oral Given 09/27/16 2143)  desmopressin (DDAVP) tablet 0.4 mg (0.4 mg Oral Given 09/27/16 2143)  rosuvastatin (CRESTOR) tablet 20 mg (20 mg Oral Given 09/27/16 2143)  irbesartan (AVAPRO) tablet 300 mg (not administered)    And  hydrochlorothiazide (HYDRODIURIL) tablet 25 mg (not administered)  sodium chloride 0.9 % bolus 1,000 mL (1,000 mLs Intravenous Transfusing/Transfer 09/27/16 2031)  pantoprazole (PROTONIX) 80 mg in sodium chloride 0.9 % 100 mL IVPB (0 mg Intravenous Stopped 09/27/16 2004)     Initial Impression / Assessment and Plan / ED Course  I have reviewed the triage vital signs and the nursing notes.  Pertinent labs & imaging  results that were available during my care of the patient were reviewed by me and considered in my medical decision making (see chart for  details).  Clinical Course    70 yo M With a chief complaint of a likely upper GI bleed. Dark and. tarry stools for the past couple days. Gross melena on rectal exam. Will give a fluid bolus discussed with GI, Dr. Henrene Pastor, will admit, start on protonix gtt, likely scope in the morning.   CRITICAL CARE Performed by: Cecilio Asper   Total critical care time: 35 minutes  Critical care time was exclusive of separately billable procedures and treating other patients.  Critical care was necessary to treat or prevent imminent or life-threatening deterioration.  Critical care was time spent personally by me on the following activities: development of treatment plan with patient and/or surrogate as well as nursing, discussions with consultants, evaluation of patient's response to treatment, examination of patient, obtaining history from patient or surrogate, ordering and performing treatments and interventions, ordering and review of laboratory studies, ordering and review of radiographic studies, pulse oximetry and re-evaluation of patient's condition.   Final Clinical Impressions(s) / ED Diagnoses   Final diagnoses:  Melena    New Prescriptions Current Discharge Medication List       Deno Etienne, DO 09/28/16 0113

## 2016-09-27 NOTE — Telephone Encounter (Signed)
This patient should not be on any NSAIDs and he should stop the duexis. Is he continuing to have dark stools? How frequent is this happening and when is the last time it has occurred? Can you clarify if he is having dark / black stools concerning for melena / active bleeding. Please let me know if this is ongoing and update me today if you can. Depending on his history, he may need CBC and an ER Evaluation if thought to have active GI bleeding.

## 2016-09-28 ENCOUNTER — Encounter (HOSPITAL_COMMUNITY)
Admission: EM | Disposition: A | Discharge status: Home / Self Care | Payer: Self-pay | Source: Home / Self Care | Attending: Emergency Medicine

## 2016-09-28 ENCOUNTER — Encounter (HOSPITAL_COMMUNITY): Payer: Self-pay | Admitting: Student

## 2016-09-28 ENCOUNTER — Observation Stay (HOSPITAL_COMMUNITY): Payer: 59 | Admitting: Certified Registered Nurse Anesthetist

## 2016-09-28 DIAGNOSIS — I10 Essential (primary) hypertension: Secondary | ICD-10-CM | POA: Diagnosis not present

## 2016-09-28 DIAGNOSIS — E119 Type 2 diabetes mellitus without complications: Secondary | ICD-10-CM

## 2016-09-28 DIAGNOSIS — K2961 Other gastritis with bleeding: Secondary | ICD-10-CM | POA: Diagnosis not present

## 2016-09-28 DIAGNOSIS — G4710 Hypersomnia, unspecified: Secondary | ICD-10-CM | POA: Diagnosis not present

## 2016-09-28 DIAGNOSIS — K922 Gastrointestinal hemorrhage, unspecified: Secondary | ICD-10-CM

## 2016-09-28 DIAGNOSIS — K921 Melena: Secondary | ICD-10-CM

## 2016-09-28 DIAGNOSIS — D62 Acute posthemorrhagic anemia: Secondary | ICD-10-CM

## 2016-09-28 HISTORY — PX: ESOPHAGOGASTRODUODENOSCOPY (EGD) WITH PROPOFOL: SHX5813

## 2016-09-28 LAB — GLUCOSE, CAPILLARY
GLUCOSE-CAPILLARY: 114 mg/dL — AB (ref 65–99)
GLUCOSE-CAPILLARY: 176 mg/dL — AB (ref 65–99)
Glucose-Capillary: 237 mg/dL — ABNORMAL HIGH (ref 65–99)
Glucose-Capillary: 95 mg/dL (ref 65–99)

## 2016-09-28 LAB — CBC
HEMATOCRIT: 27.8 % — AB (ref 39.0–52.0)
Hemoglobin: 8.4 g/dL — ABNORMAL LOW (ref 13.0–17.0)
MCH: 21.3 pg — ABNORMAL LOW (ref 26.0–34.0)
MCHC: 30.2 g/dL (ref 30.0–36.0)
MCV: 70.4 fL — AB (ref 78.0–100.0)
Platelets: 221 10*3/uL (ref 150–400)
RBC: 3.95 MIL/uL — ABNORMAL LOW (ref 4.22–5.81)
RDW: 18.9 % — AB (ref 11.5–15.5)
WBC: 5.7 10*3/uL (ref 4.0–10.5)

## 2016-09-28 LAB — BASIC METABOLIC PANEL
Anion gap: 3 — ABNORMAL LOW (ref 5–15)
BUN: 28 mg/dL — AB (ref 6–20)
CHLORIDE: 109 mmol/L (ref 101–111)
CO2: 25 mmol/L (ref 22–32)
Calcium: 9.5 mg/dL (ref 8.9–10.3)
Creatinine, Ser: 1.02 mg/dL (ref 0.61–1.24)
GFR calc Af Amer: >60 mL/min (ref >60)
GFR calc non Af Amer: >60 mL/min (ref >60)
Glucose, Bld: 186 mg/dL — ABNORMAL HIGH (ref 65–99)
POTASSIUM: 3.9 mmol/L (ref 3.5–5.1)
Sodium: 137 mmol/L (ref 135–145)

## 2016-09-28 LAB — PROTIME-INR
INR: 1.19
Prothrombin Time: 15.2 seconds (ref 11.4–15.2)

## 2016-09-28 SURGERY — ESOPHAGOGASTRODUODENOSCOPY (EGD) WITH PROPOFOL
Anesthesia: Monitor Anesthesia Care

## 2016-09-28 MED ORDER — LACTATED RINGERS IV SOLN
INTRAVENOUS | Status: DC | PRN
  Administered 2016-09-28: 13:00:00 via INTRAVENOUS

## 2016-09-28 MED ORDER — FENTANYL CITRATE (PF) 100 MCG/2ML IJ SOLN: 25.0000 ug | INTRAMUSCULAR | Status: DC | PRN

## 2016-09-28 MED ORDER — OXYCODONE HCL 5 MG/5ML PO SOLN: 5.0000 mg | Freq: Once | ORAL | Status: DC | PRN

## 2016-09-28 MED ORDER — PANTOPRAZOLE SODIUM 40 MG PO TBEC
40.0000 mg | DELAYED_RELEASE_TABLET | Freq: Every day | ORAL | Status: DC
Start: 2016-09-29 — End: 2016-09-29
  Administered 2016-09-29: 40 mg via ORAL
  Filled 2016-09-28: qty 1

## 2016-09-28 MED ORDER — ONDANSETRON HCL 4 MG/2ML IJ SOLN: 4.0000 mg | Freq: Four times a day (QID) | INTRAMUSCULAR | Status: DC | PRN

## 2016-09-28 MED ORDER — OXYCODONE HCL 5 MG PO TABS: 5.0000 mg | ORAL_TABLET | Freq: Once | ORAL | Status: DC | PRN

## 2016-09-28 MED ORDER — PROPOFOL 10 MG/ML IV BOLUS
INTRAVENOUS | Status: AC
  Filled 2016-09-28: qty 40

## 2016-09-28 MED ORDER — SODIUM CHLORIDE 0.9 % IV SOLN: INTRAVENOUS | Status: DC

## 2016-09-28 MED ORDER — BUTAMBEN-TETRACAINE-BENZOCAINE 2-2-14 % EX AERO
INHALATION_SPRAY | CUTANEOUS | Status: DC | PRN
  Administered 2016-09-28: 2 via TOPICAL

## 2016-09-28 MED ORDER — PROPOFOL 10 MG/ML IV BOLUS
INTRAVENOUS | Status: DC | PRN
Start: 2016-09-28 — End: 2016-09-28
  Administered 2016-09-28: 20 mg via INTRAVENOUS
  Administered 2016-09-28: 40 mg via INTRAVENOUS
  Administered 2016-09-28 (×5): 20 mg via INTRAVENOUS

## 2016-09-28 SURGICAL SUPPLY — 14 items

## 2016-09-28 NOTE — Consult Note (Signed)
Consultation  Referring Provider:   Dr. Karleen Hampshire   Primary Care Physician:  James Lopes, MD Primary Gastroenterologist:  Trying to establish with Dr. Havery Moros       Reason for Consultation: Melena, Anemia             HPI:   James [name]. is a 70 y.o. Caucasian male with past medical history significant for diabetes, GERD, hiatal hernia, hypertension, hyperlipidemia, recent diagnosis of liver cirrhosis thought secondary to [facility] with questionable liver mass, polycythemia vera, sleep apnea and history of tubular adenomas, who presented to the ER yesterday, 09/27/16 after having recent labs done by [facility] showing anemia and with history of melena.  Today, the patient tells me that he has actually never been seen by Dr. Havery Moros, but was told to establish care with him recently by his primary care provider. He called the [facility] office yesterday and described his complaint and was told to come in for labs. He was then called back around 5:00 and told that he should proceed to the ER due to a finding of anemia and because of his complaint of melena. The patient tells me that in May of this year he developed severe abdominal pain and was found to have "blood clots in my kidney and spleen", at that time he was started on Xarelto. He expresses that 2-3 weeks ago he had a follow-up CT which showed that his "clots were okay". Last week he proceeded to his orthopedic doctor for a complaint of bursitis in his shoulder. He was started on an ibuprofen plus famotidine mixture at that time. Then on Sunday/Monday of this past week, 10/8 or 10/9, he had an episode of a soft black stool. This has continued once daily since that time. The patient became concerned as he was told that if he ever had black stool he needed to go to the ER. He contacted his various doctors and was put in touch with Dr. Havery Moros for further evaluation. He then proceeded as above with labs and to the ER yesterday.  Patient  describes a long history of reflux and diagnosis of a hiatal hernia for which he has been using Omeprazole 20 mg daily for "many years". He denies any acute symptoms of abdominal pain, heartburn or reflux.  Patient tells me he has had an EGD and colonoscopy in the past but is unsure where this took place or how long ago they were. He is certain they were "over 5 years ago".  Per chart review, patient has seen Dr. Havery Moros for for a screening colonoscopy completed on 12/10/15. Independent review of report and images shows 6 small cecal polyps, moderate diverticulosis in the sigmoid colon and internal hemorrhoids. Pathology revealed tubular adenomas. Also, finding of pathology from EGD on 05/16/2002 with biopsies consistent with gastroesophageal reflux with focal ulceration. No intestinal metaplasia or malignancy identified.  Patient's social history is positive for continuing to work at what seems like a very stressful and "intense" job. The patient questions me multiple times on how many days he will need to stay in the hospital and alerts me that he needs to get out as soon as he can so he can get back to work.  Patient denies fever, chills, change in appetite, nausea, vomiting, abdominal pain, bright red blood in his stool or previous episodes of the same.  Past Medical History:  Diagnosis Date  . DM (diabetes mellitus) ([place])   . GERD (gastroesophageal reflux disease)   .  Hiatal hernia   . History of shingles 04/03/2014  . HTN (hypertension)   . Hyperlipidemia   . Insomnia    resolved by using CPAP  . Liver cirrhosis secondary to NASH ([place]) 05/03/2016  . Macular degeneration   . Nocturia more than twice per night 11/11/2014   For 6 month, 5 nocturias a night.   . Obesity   . 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 Rhinitis   . Situational depression   . Sleep apnea    uses CPAP every night  . Tubular adenoma 12/10/2015   6 cecum polyps    Past Surgical History:  Procedure  Laterality Date  . CARDIAC CATHETERIZATION     greater 10 yrs ago, normal  . CARPAL TUNNEL RELEASE     bilateral  . COLONOSCOPY  06/2005   [facility] Dr Lajoyce Corners  . ROTATOR CUFF REPAIR     right  . TENDON REPAIR     left arm  . TONSILLECTOMY    . TOTAL KNEE ARTHROPLASTY Right 2010  . WISDOM TOOTH EXTRACTION      Family History  Problem Relation Age of Onset  . Colon cancer    . Heart failure    . Heart attack    . Lung disease Father     history  . Diabetes Mother     history  . Stroke Mother     history  . Colon polyps Neg Hx   . Rectal cancer Neg Hx   . Stomach cancer Neg Hx   . Esophageal cancer Neg Hx     Social History  Substance Use Topics  . Smoking status: Never Smoker  . Smokeless tobacco: Never Used     Comment: never used tobacco  . Alcohol use 0.0 oz/week     Comment: rarely     Prior to Admission medications   Medication Sig Start Date End Date Taking? Authorizing Provider  [facility] B-12 500 MCG SUBL Place 1 tablet under the tongue daily with breakfast.  09/17/14  Yes Historical Provider, MD  desmopressin (DDAVP) 0.2 MG tablet Take 0.4 mg by mouth at bedtime.  03/21/14  Yes Historical Provider, MD  glimepiride (AMARYL) 2 MG tablet Take 2-4 mg by mouth 2 (two) times daily. Take 38ms in the morning and 265m at night 03/21/14  Yes Historical Provider, MD  metFORMIN (GLUCOPHAGE-XR) 500 MG 24 hr tablet Take 500 mg by mouth 2 (two) times daily.    Yes Historical Provider, MD  omeprazole (PRILOSEC) 20 MG capsule Take 20 mg by mouth daily.     Yes Historical Provider, MD  omeprazole (PRILOSEC) 20 MG capsule Take 1 capsule (20 mg total) by mouth 2 (two) times daily. 09/27/16 10/11/16 Yes StManus GunningMD  ONE TOUCH ULTRA TEST test strip by Other route as needed.  08/04/13  Yes Historical Provider, MD  PRESCRIPTION MEDICATION every 8 (eight) weeks. Gets injections in right eye at dr's office every 8 weeks   Yes Historical Provider, MD  rivaroxaban (XARELTO) 20 MG  TABS tablet Take 1 tablet (20 mg total) by mouth daily with supper. 05/24/16  Yes ViHosie PoissonMD  rosuvastatin (CRESTOR) 20 MG tablet Take 20 mg by mouth at bedtime.    Yes Historical Provider, MD  tobramycin (TOBREX) 0.3 % ophthalmic solution Place 1 drop into the right eye every 4 (four) hours. X 5 days Patient taking differently: Place 1 drop into the right eye every 4 (four) hours. Use 4 times daily  the 2 days before, the day of, and the 2 days after eye injections 04/08/16  Yes Janne Napoleon, NP  Triamcinolone Acetonide (KENALOG IJ) Inject as directed once.   Yes Historical Provider, MD  valsartan-hydrochlorothiazide (DIOVAN-HCT) 320-25 MG per tablet Take 1 tablet by mouth daily with breakfast.  07/20/13  Yes Historical Provider, MD  verapamil (CALAN-SR) 240 MG CR tablet Take 240 mg by mouth daily.   Yes Historical Provider, MD  VIAGRA 100 MG tablet Take 100 mg by mouth daily as needed for erectile dysfunction.  10/26/14  Yes Historical Provider, MD    Current Facility-Administered Medications  Medication Dose Route Frequency Provider Last Rate Last Dose  . acetaminophen (TYLENOL) tablet 650 mg  650 mg Oral Q6H PRN Edwin Dada, MD       Or  . acetaminophen (TYLENOL) suppository 650 mg  650 mg Rectal Q6H PRN Edwin Dada, MD      . desmopressin (DDAVP) tablet 0.4 mg  0.4 mg Oral QHS Edwin Dada, MD   0.4 mg at 09/27/16 2143  . irbesartan (AVAPRO) tablet 300 mg  300 mg Oral Daily Edwin Dada, MD       And  . hydrochlorothiazide (HYDRODIURIL) tablet 25 mg  25 mg Oral Daily Edwin Dada, MD      . insulin aspart (novoLOG) injection 0-15 Units  0-15 Units Subcutaneous Q6H Edwin Dada, MD      . ondansetron (ZOFRAN) tablet 4 mg  4 mg Oral Q6H PRN Edwin Dada, MD       Or  . ondansetron (ZOFRAN) injection 4 mg  4 mg Intravenous Q6H PRN Edwin Dada, MD      . pantoprazole (PROTONIX) 80 mg in sodium chloride 0.9 % 250 mL (0.32  mg/mL) infusion  8 mg/hr Intravenous Continuous Deno Etienne, DO 25 mL/hr at 09/28/16 0605 8 mg/hr at 09/28/16 0605  . rosuvastatin (CRESTOR) tablet 20 mg  20 mg Oral QHS Edwin Dada, MD   20 mg at 09/27/16 2143  . verapamil (CALAN-SR) CR tablet 240 mg  240 mg Oral Daily Edwin Dada, MD   240 mg at 09/27/16 2143    Allergies as of 09/27/2016  . (No Known Allergies)     Review of Systems:     Constitutional: No weight loss, fever, chills, weakness or fatigue HEENT: Eyes: No change in vision               Ears, Nose, Throat:  No change in hearing or congestion Skin: No rash or itching Cardiovascular: No chest pain, chest pressure or palpitations   Respiratory: No SOB or cough Gastrointestinal: See HPI and otherwise negative Genitourinary: No dysuria or change in urinary frequency Neurological: No headache, dizziness or syncope Musculoskeletal: No new muscle or joint pain Hematologic: See history of present illness No bruising Psychiatric: No history of depression or anxiety   Physical Exam:  Vital signs in last 24 hours: Temp:  [97.8 F (36.6 C)-98.5 F (36.9 C)] 97.8 F (36.6 C) (10/12 7622) Pulse Rate:  [62-90] 62 (10/12 0633) Resp:  [17-22] 20 (10/12 0633) BP: (107-128)/(62-79) 114/73 (10/12 0633) SpO2:  [94 %-99 %] 99 % (10/12 0633) Weight:  [227 lb 12.8 oz (103.3 kg)] 227 lb 12.8 oz (103.3 kg) (10/11 2133) Last BM Date: 09/27/16 General:   Pleasant Overweight Caucasian male appears to be in NAD, Well developed, Well nourished, alert and cooperative Head:  Normocephalic and atraumatic. Eyes:   PEERL, EOMI.  No icterus. Conjunctiva pink. Ears:  Normal auditory acuity. Neck:  Supple Throat: Oral cavity and pharynx without inflammation, swelling or lesion. Lungs: Respirations even and unlabored. Lungs clear to auscultation bilaterally.   No wheezes, crackles, or rhonchi.  Heart: Normal S1, S2. No MRG. Regular rate and rhythm. No peripheral edema, cyanosis or  pallor.  Abdomen:  Soft, nondistended, nontender. No rebound or guarding. Normal bowel sounds. No appreciable masses or hepatomegaly. Rectal:  Not performed.  Msk:  Symmetrical without gross deformities. Peripheral pulses intact.  Extremities:  Without edema, no deformity or joint abnormality.  Neurologic:  Alert and  oriented x4;  grossly normal neurologically.  Skin:   Dry and intact without significant lesions or rashes. Psychiatric: Oriented to person, place and time. Demonstrates good judgement and reason without abnormal affect or behaviors.   LAB RESULTS:  Recent Labs  09/27/16 1455 09/27/16 1844 09/27/16 1859 09/28/16 0405  WBC 8.8 10.4  --  5.7  HGB 9.1* 9.9* 11.2* 8.4*  HCT 29.0* 32.6* 33.0* 27.8*  PLT 296.0 335  --  221   BMET  Recent Labs  09/27/16 1844 09/27/16 1859 09/28/16 0405  NA 136 138 137  K 4.0 3.9 3.9  CL 107 106 109  CO2 22  --  25  GLUCOSE 92 88 186*  BUN 32* 32* 28*  CREATININE 1.13 1.20 1.02  CALCIUM 10.2  --  9.5   LFT  Recent Labs  09/27/16 1844  PROT 7.1  ALBUMIN 4.0  AST 24  ALT 23  ALKPHOS 87  BILITOT 0.4   PT/INR No results for input(s): LABPROT, INR in the last 72 hours.   Imaging:  MR Liver w and wo contrast 08/23/16: CLINICAL DATA:  Evaluate liver lesion on ultrasound. History of NASH cirrhosis.  EXAM: MRI ABDOMEN WITHOUT AND WITH CONTRAST  TECHNIQUE: Multiplanar multisequence MR imaging of the abdomen was performed both before and after the administration of intravenous contrast.  CONTRAST:  10 mL Eovist IV  COMPARISON:  Abdominal ultrasound dated 08/06/2016. CT abdomen/pelvis dated 04/29/2016.  FINDINGS: Lower chest:  Lung bases are clear.  Hepatobiliary: Nodular hepatic contour, compatible with known cirrhosis. No hepatic steatosis.  1.8 x 1.7 cm T2 hyperintense lesion in segment 4A (series 10/ image 10), not well visualized on prior CT. No enhancement on the postcontrast subtraction images.  Lesion could not be characterized as a simple cyst on ultrasound, but has imaging characteristics which would favor a simple or mildly complex cyst on MR.  Pancreas: Within normal limits.  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  Adrenals/Urinary Tract: Adrenal glands are within normal limits.  Multiple bilateral renal cysts, including a dominant 6.9 cm cyst in the left lower kidney (series 10/image 40). Cortical scarring along the lateral interpolar left kidney. No hydronephrosis.  Stomach/Bowel: Stomach is grossly unremarkable.  Visualized bowel is notable for colonic diverticulosis.  Vascular/Lymphatic: No evidence of abdominal aortic aneurysm.  No suspicious abdominal lymphadenopathy.  Other: No abdominal ascites.  Musculoskeletal: No focal osseous lesions.  IMPRESSION: Cirrhosis.  1.8 cm T2 hyperintense lesion in segment 4A, without enhancement. Overall, the MR appearance on the current study favors a simple or mildly complex cyst. However, the lesion was not well visualized on prior CT. As such, correlate with serum AFP and consider follow-up MR abdomen with/ without contrast in 3-6 months.  Sequela of prior splenic infarcts.  Additional ancillary findings as above.   Electronically Signed  By: Julian Hy M.D.   On: 08/23/2016 11:05  CLINICAL DATA:  History of portal vein and splenic vein thrombosis. Polycythemia.  EXAM: ULTRASOUND ABDOMEN COMPLETE  ULTRASOUND HEPATIC ELASTOGRAPHY  TECHNIQUE: Sonography of the upper abdomen was performed. In addition, ultrasound elastography evaluation of the liver was performed. A region of interest was placed within the right lobe of the liver. Following application of a compressive sonographic pulse, shear waves were detected in the adjacent hepatic tissue and the shear wave velocity was calculated.  Multiple assessments were performed at the selected site. Median shear wave velocity is correlated to a Metavir fibrosis score.  COMPARISON:  04/29/2016 CT abdomen/pelvis.  FINDINGS: ULTRASOUND ABDOMEN  Gallbladder: No gallstones or wall thickening visualized. No sonographic Murphy sign noted by sonographer.  Common bile duct: Diameter: 5 mm  Liver: Liver parenchyma is diffusely mildly coarsened in echotexture. No definite liver surface irregularity. Hypoechoic 1.5 x 1.2 x 1.6 cm anterior liver lesion, which does not demonstrate definitive posterior acoustic enhancement to suggest a cyst, and which was not definitely visualized on 04/29/2016 CT of the abdomen. No additional liver lesions are demonstrated.  IVC: No abnormality visualized.  Pancreas:  Limited visualized portion unremarkable.  Spleen: Normal size spleen. No discrete splenic mass. Previously described splenic infarcts have resolved.  Right Kidney: Length: 11.2 cm. Echogenicity within normal limits. No hydronephrosis. Simple exophytic 3.7 x 3.3 x 2.9 cm renal cyst in the lower right kidney.  Left Kidney: Length: 12.3 cm. Echogenicity within normal limits. No hydronephrosis. Simple left renal cysts measuring up to 6.2 x 5.8 x 6.3 cm in the lower left kidney.  Abdominal aorta: No aneurysm visualized.  Other findings: None.  ULTRASOUND HEPATIC ELASTOGRAPHY 08/08/16  Device: Siemens Helix VTQ  Patient position:  Left Lateral Decubitus  Transducer 6C1  Number of measurements:  10  Hepatic Segment:  8  Median velocity:   2.90  m/sec  IQR: 0.57  IQR/Median velocity ratio 0.19  Corresponding Metavir fibrosis score:  Some F3 + F4  Risk of fibrosis: High  Limitations of exam: None  Pertinent findings noted on other imaging exams:  None  Please note that abnormal shear wave velocities may also be identified in clinical settings other than with hepatic fibrosis, such as:  acute hepatitis, elevated right heart and central venous pressures including use of beta blockers, veno-occlusive disease (Budd-Chiari), infiltrative processes such as mastocytosis/amyloidosis/infiltrative tumor, extrahepatic cholestasis, in the post-prandial state, and liver transplantation. Correlation with patient history, laboratory data, and clinical condition recommended.  IMPRESSION: 1. Diffusely mildly coarsened liver parenchymal echotexture, a nonspecific finding that could indicate hepatic fibrosis. No definite liver surface irregularity to suggest cirrhosis. 2. Hypoechoic 1.6 cm anterior liver lesion, possibly new since 04/29/2016, indeterminate. Liver protocol MRI of the abdomen with and without IV contrast is recommended for further characterization. 3. No secondary findings of portal hypertension. 4. Hepatic elastography results:  Median hepatic shear wave velocity is calculated at 2.90 m/sec.  Corresponding Metavir fibrosis score is Some F3 + F4.  Risk of fibrosis is high.  Follow-up:  Followup Advised.   Electronically Signed   By: Ilona Sorrel M.D.   On: 08/08/2016 15:43   PREVIOUS ENDOSCOPIES:            See HPI EGD 2003 and [place] 2016   Impression / Plan:   Impression: 1. Acute GI bleed: Thought to be NSAID induced after starting ibuprofen for recent shoulder bursitis last week, exacerbated by chronic anticoagulation was a result oh, melena 2-3  over the past 2-3 days, currently stable and asymptomatic from anemia standpoint; consider PUD versus other 2. Blood loss anemia on polycythemia vera: Hemoglobin 9.1 at time of admission down from 13 in June, currently 8.4; due to above 3. Portal vein thrombosis: thought secondary to [facility], patient on Xarelto 4. ?Cirrhosis with liver lesion: Recently diagnosed, history of liver lesion noted on ultrasound and MRI, repeat imaging recommended in 3-6 months; Consider cyst vs other 5. OSA on CPAP  Plan: 1.  Recommend EGD for further evaluation. Discussed risks, benefits, limitations and alternatives of the patient and he agrees to proceed. Scheduled at ~1230 today 2. Patient to remain nothing by mouth until after EGD 3. Agree with Protonix. 4. Patient will need follow with our clinic regarding his cirrhosis with question of liver lesion in the future. Will order AFP today. 5. Will discuss above with Dr. Carlean Purl, please await any further recommendations.  Thank you for your kind consultation, we will continue to follow.  Lavone Nian Lemmon  09/28/2016, 9:27 AM Pager #: [phone]    [facility] Attending   I have taken an interval history, reviewed the chart and examined the patient. I agree with the Advanced Practitioner's note, impression and recommendations.    Gatha Mayer, MD, [facility]

## 2016-09-28 NOTE — Progress Notes (Signed)
Initial Nutrition Assessment  DOCUMENTATION CODES:   Obesity unspecified  INTERVENTION:   RD to follow-up to complete assessment. Pt having EGD at this time.  NUTRITION DIAGNOSIS:   Increased nutrient needs related to other (see comment) (liver cirrhosis) as evidenced by estimated needs.  GOAL:   Patient will meet greater than or equal to 90% of their needs  MONITOR:   Diet advancement, Labs, Weight trends, I & O's  REASON FOR ASSESSMENT:   Malnutrition Screening Tool    ASSESSMENT:   70 y.o. male with a past medical history significant for cirrhosis from NASH, obesity, Polycythemia vera, HTN, NIDDM, and portal vein thrombosis on Xarelto since May who presents with melena for 3 days.  Patient leaving to have EGD at time of RD visit. Will need to follow-up to complete assessment and NFPE.  Pt with increased needs d/t liver cirrhosis. Pt NPO for procedure.  Per chart review, pt has lost 13 lb since 5/30 (5% wt loss x 4.5 months, significant for time frame).  Labs reviewed. Medications reviewed.  Diet Order:  Diet NPO time specified  Skin:  Reviewed, no issues  Last BM:  10/11  Height:   Ht Readings from Last 1 Encounters:  09/27/16 5' 6"  (1.676 m)    Weight:   Wt Readings from Last 1 Encounters:  09/27/16 227 lb 12.8 oz (103.3 kg)    Ideal Body Weight:  64.5 kg  BMI:  Body mass index is 36.77 kg/m.  Estimated Nutritional Needs:   Kcal:  2100-2300  Protein:  95-105g  Fluid:  2.1L/day  EDUCATION NEEDS:   No education needs identified at this time  Clayton Bibles, MS, RD, LDN Pager: [phone] After Hours Pager: [phone]

## 2016-09-28 NOTE — Transfer of Care (Signed)
Immediate Anesthesia Transfer of Care Note  Patient: James [name].  Procedure(s) Performed: Procedure(s): ESOPHAGOGASTRODUODENOSCOPY (EGD) WITH PROPOFOL (N/A)  Patient Location: PACU and Endoscopy Unit  Anesthesia Type:MAC  Level of Consciousness: awake, oriented, patient cooperative, lethargic and responds to stimulation  Airway & Oxygen Therapy: Patient Spontanous Breathing and Patient connected to nasal cannula oxygen  Post-op Assessment: Report given to RN, Post -op Vital signs reviewed and stable and Patient moving all extremities  Post vital signs: Reviewed and stable  Last Vitals:  Vitals:   09/28/16 0900 09/28/16 1232  BP: 126/80 135/69  Pulse: 64 66  Resp: 20 18  Temp: 37 C 36.9 C    Last Pain:  Vitals:   09/28/16 1232  TempSrc: Oral  PainSc:          Complications: No apparent anesthesia complications

## 2016-09-28 NOTE — Progress Notes (Signed)
PROGRESS NOTE    James [name].  VOJ:[id number] DOB: 1946/04/28 DOA: 09/27/2016 PCP: [name], MD    Brief Narrative: 70 year old admitted for anemia, hemoglobin of 9 , drop from baseline of 11.    Assessment & Plan:   Principal Problem:   Acute upper GI bleed Active Problems:   Polycythemia vera (HCC)   Severe obesity (BMI >= 40) (HCC)   Hypersomnia with sleep apnea   Essential hypertension   Portal vein thrombosis   Diabetes mellitus (HCC)   Liver cirrhosis secondary to NASH (HCC)   Acute blood loss anemia   Erosive gastritis with hemorrhage   Upper GI bleed : hemoglobin dropped from 11 to 8.4.  Gastroenterology consulted and he underwent EGD showing Non-bleeding erosive gastropathy. Biopsied. The examination was otherwise normal. Resume diabetic diet and watch hemoglobin in am. Follow up biopsy report.   Hypertension: controlled.  Diabetes mellitus: CBG (last 3)   Recent Labs  09/28/16 0028 09/28/16 0627 09/28/16 1209  GLUCAP 237* 114* 95    Resume SSI.  Resumed carb modified diet.   Acute blood loss anemia: from GI bleed.  Hemoglobin stable at 8.4.  Monitor.   Liver cirrhosis secondary to NASH: Stable.    OSA on CPAP:   Polycythemia Vera: Stable.   DVT prophylaxis: SCD'S Code Status: FULL CODE. Family Communication: (none at bedside.  Disposition Plan: possibly home in am.    Consultants:   Gastroenterology.    Procedures: EGD.    Antimicrobials: none at bedside.    Subjective: No new complaints.   Objective: Vitals:   09/28/16 1326 09/28/16 1330 09/28/16 1350 09/28/16 1400  BP: (!) 100/49 (!) 111/51 123/75 139/75  Pulse: 69 68  65  Resp: 16 17  18   Temp: 97.9 F (36.6 C)   98 F (36.7 C)  TempSrc: Oral   Oral  SpO2: 100% 100%  98%  Weight:      Height:        Intake/Output Summary (Last 24 hours) at 09/28/16 1753 Last data filed at 09/28/16 1324  Gross per 24 hour  Intake              500 ml  Output                 0 ml  Net              500 ml   Filed Weights   09/27/16 2133 09/28/16 1232  Weight: 103.3 kg (227 lb 12.8 oz) 103 kg (227 lb)    Examination:  General exam: Appears calm and comfortable  Respiratory system: Clear to auscultation. Respiratory effort normal. Cardiovascular system: S1 & S2 heard, RRR. No JVD, murmurs, rubs, gallops or clicks. No pedal edema. Gastrointestinal system: Abdomen is nondistended, soft and nontender. No organomegaly or masses felt. Normal bowel sounds heard. Central nervous system: Alert and oriented. No focal neurological deficits. Extremities: Symmetric 5 x 5 power. Skin: No rashes, lesions or ulcers Psychiatry: Judgement and insight appear normal. Mood & affect appropriate.     Data Reviewed: I have personally reviewed following labs and imaging studies  CBC:  Recent Labs Lab 09/27/16 1455 09/27/16 1844 09/27/16 1859 09/28/16 0405  WBC 8.8 10.4  --  5.7  NEUTROABS 6.4  --   --   --   HGB 9.1* 9.9* 11.2* 8.4*  HCT 29.0* 32.6* 33.0* 27.8*  MCV 67.0 Repeated and verified X2.* 67.8*  --  70.4*  PLT 296.0 335  --  106   Basic Metabolic Panel:  Recent Labs Lab 09/27/16 1844 09/27/16 1859 09/28/16 0405  NA 136 138 137  K 4.0 3.9 3.9  CL 107 106 109  CO2 22  --  25  GLUCOSE 92 88 186*  BUN 32* 32* 28*  CREATININE 1.13 1.20 1.02  CALCIUM 10.2  --  9.5   GFR: Estimated Creatinine Clearance: 74.7 mL/min (by C-G formula based on SCr of 1.02 mg/dL). Liver Function Tests:  Recent Labs Lab 09/27/16 1844  AST 24  ALT 23  ALKPHOS 87  BILITOT 0.4  PROT 7.1  ALBUMIN 4.0   No results for input(s): LIPASE, AMYLASE in the last 168 hours. No results for input(s): AMMONIA in the last 168 hours. Coagulation Profile:  Recent Labs Lab 09/28/16 0948  INR 1.19   Cardiac Enzymes: No results for input(s): CKTOTAL, CKMB, CKMBINDEX, TROPONINI in the last 168 hours. BNP (last 3 results) No results for input(s): PROBNP in the last  8760 hours. HbA1C: No results for input(s): HGBA1C in the last 72 hours. CBG:  Recent Labs Lab 09/28/16 0028 09/28/16 0627 09/28/16 1209  GLUCAP 237* 114* 95   Lipid Profile: No results for input(s): CHOL, HDL, LDLCALC, TRIG, CHOLHDL, LDLDIRECT in the last 72 hours. Thyroid Function Tests: No results for input(s): TSH, T4TOTAL, FREET4, T3FREE, THYROIDAB in the last 72 hours. Anemia Panel: No results for input(s): VITAMINB12, FOLATE, FERRITIN, TIBC, IRON, RETICCTPCT in the last 72 hours. Sepsis Labs: No results for input(s): PROCALCITON, LATICACIDVEN in the last 168 hours.  No results found for this or any previous visit (from the past 240 hour(s)).       Radiology Studies: No results found.      Scheduled Meds: . desmopressin  0.4 mg Oral QHS  . irbesartan  300 mg Oral Daily   And  . hydrochlorothiazide  25 mg Oral Daily  . insulin aspart  0-15 Units Subcutaneous Q6H  . [START ON 09/29/2016] pantoprazole  40 mg Oral QAC breakfast  . rosuvastatin  20 mg Oral QHS  . verapamil  240 mg Oral Daily   Continuous Infusions:    LOS: 0 days    Time spent: 30 minutes.     Hosie Poisson, MD [facility] Pager [phone]  If 7PM-7AM, please contact night-coverage [email or web address] Password [facility] 09/28/2016, 5:53 PM

## 2016-09-28 NOTE — Anesthesia Preprocedure Evaluation (Signed)
Anesthesia Evaluation  Patient identified by MRN, date of birth, ID band Patient awake    Reviewed: Allergy & Precautions, NPO status , Patient's Chart, lab work & pertinent test results  Airway Mallampati: II   Neck ROM: full    Dental   Pulmonary shortness of breath, sleep apnea ,    breath sounds clear to auscultation       Cardiovascular hypertension,  Rhythm:regular Rate:Normal     Neuro/Psych Depression    GI/Hepatic hiatal hernia, GERD  ,  Endo/Other  diabetes, Type 2  Renal/GU      Musculoskeletal   Abdominal   Peds  Hematology  (+) anemia ,   Anesthesia Other Findings   Reproductive/Obstetrics                             Anesthesia Physical Anesthesia Plan  ASA: III  Anesthesia Plan: MAC   Post-op Pain Management:    Induction: Intravenous  Airway Management Planned: Nasal Cannula  Additional Equipment:   Intra-op Plan:   Post-operative Plan:   Informed Consent: I have reviewed the patients History and Physical, chart, labs and discussed the procedure including the risks, benefits and alternatives for the proposed anesthesia with the patient or authorized representative who has indicated his/her understanding and acceptance.     Plan Discussed with: CRNA, Anesthesiologist and Surgeon  Anesthesia Plan Comments:         Anesthesia Quick Evaluation

## 2016-09-28 NOTE — Anesthesia Postprocedure Evaluation (Signed)
Anesthesia Post Note  Patient: James [name].  Procedure(s) Performed: Procedure(s) (LRB): ESOPHAGOGASTRODUODENOSCOPY (EGD) WITH PROPOFOL (N/A)  Patient location during evaluation: PACU Anesthesia Type: MAC Level of consciousness: awake and alert Pain management: pain level controlled Vital Signs Assessment: post-procedure vital signs reviewed and stable Respiratory status: spontaneous breathing, nonlabored ventilation, respiratory function stable and patient connected to nasal cannula oxygen Cardiovascular status: stable and blood pressure returned to baseline Anesthetic complications: no    Last Vitals:  Vitals:   09/28/16 1330 09/28/16 1350  BP: (!) 111/51 123/75  Pulse: 68   Resp: 17   Temp:      Last Pain:  Vitals:   09/28/16 1326  TempSrc: Oral  PainSc:                  [place]

## 2016-09-28 NOTE — Op Note (Signed)
[facility] Patient Name: James [name] Procedure Date: 09/28/2016 MRN: [id number] Attending MD: Gatha Mayer , MD Date of Birth: 07/13/1946 CSN: [id number] Age: 70 Admit Type: Inpatient Procedure:                Upper GI endoscopy Indications:              Melena Providers:                Gatha Mayer, MD, Sarah Monday RN, RN, Corliss Parish, Technician Referring MD:              Medicines:                Monitored Anesthesia Care Complications:            No immediate complications. Estimated Blood Loss:     Estimated blood loss was minimal. Procedure:                Pre-Anesthesia Assessment:                           - Prior to the procedure, a History and Physical                            was performed, and patient medications and                            allergies were reviewed. The patient's tolerance of                            previous anesthesia was also reviewed. The risks                            and benefits of the procedure and the sedation                            options and risks were discussed with the patient.                            All questions were answered, and informed consent                            was obtained. Prior Anticoagulants: The patient                            last took ibuprofen 3 days and Xarelto                            (rivaroxaban) 2 days prior to the procedure. After                            reviewing the risks and benefits, the patient was  deemed in satisfactory condition to undergo the                            procedure.                           After obtaining informed consent, the endoscope was                            passed under direct vision. Throughout the                            procedure, the patient's blood pressure, pulse, and                            oxygen saturations were monitored continuously. The          Endoscope was introduced through the mouth, and                            advanced to the second part of duodenum. The upper                            GI endoscopy was accomplished without difficulty.                            The patient tolerated the procedure well. Scope In: Scope Out: Findings:      Multiple dispersed, diminutive non-bleeding erosions were found in the       prepyloric region of the stomach. There were no stigmata of recent       bleeding. Biopsies were taken with a cold forceps for Helicobacter       pylori testing using CLOtest. Verification of patient identification for       the specimen was done. Estimated blood loss was minimal.      A few 2 to 5 mm sessile polyps were found in the gastric body. THESE ARE       CONSISTENT WITH BENIGN FUNDIC GLAND POLYPS      The exam was otherwise without abnormality.      The cardia and gastric fundus were normal on retroflexion. Impression:               - Non-bleeding erosive gastropathy. Biopsied.                           - The examination was otherwise normal. Moderate Sedation:      Please see anesthesia notes, moderate sedation not given Recommendation:           - Return patient to hospital ward for ongoing care.                           - Diabetic (ADA) diet.                           - Continue present medications.                           -  Await pathology results.                           ORAL PPI                           STAY OFF XARELTO FOR A FEW MORE DAYS                           TREAT H PYLORI IF +                           WILL ARRANGE GI F/U DR. Valentina Shaggy OF NSAIDS (TOOK IBUPROFEN RECENTLY) Procedure Code(s):        --- Professional ---                           [phone], Esophagogastroduodenoscopy, flexible,                            transoral; with biopsy, single or multiple Diagnosis Code(s):        --- Professional ---                           K31.89,  Other diseases of stomach and duodenum                           K92.1, Melena (includes Hematochezia) CPT copyright 2016 American Medical Association. All rights reserved. The codes documented in this report are preliminary and upon coder review may  be revised to meet current compliance requirements. Gatha Mayer, MD 09/28/2016 1:39:03 PM This report has been signed electronically. Number of Addenda: 0

## 2016-09-29 ENCOUNTER — Encounter (HOSPITAL_COMMUNITY): Payer: Self-pay | Admitting: Internal Medicine

## 2016-09-29 DIAGNOSIS — K2961 Other gastritis with bleeding: Secondary | ICD-10-CM | POA: Diagnosis not present

## 2016-09-29 DIAGNOSIS — G4730 Sleep apnea, unspecified: Secondary | ICD-10-CM

## 2016-09-29 DIAGNOSIS — I10 Essential (primary) hypertension: Secondary | ICD-10-CM | POA: Diagnosis not present

## 2016-09-29 DIAGNOSIS — E119 Type 2 diabetes mellitus without complications: Secondary | ICD-10-CM | POA: Diagnosis not present

## 2016-09-29 DIAGNOSIS — G4710 Hypersomnia, unspecified: Secondary | ICD-10-CM | POA: Diagnosis not present

## 2016-09-29 DIAGNOSIS — K922 Gastrointestinal hemorrhage, unspecified: Secondary | ICD-10-CM | POA: Diagnosis not present

## 2016-09-29 LAB — AFP TUMOR MARKER: AFP-Tumor Marker: 1.4 ng/mL (ref 0.0–8.3)

## 2016-09-29 LAB — CBC
HCT: 28.7 % — ABNORMAL LOW (ref 39.0–52.0)
HEMOGLOBIN: 8.7 g/dL — AB (ref 13.0–17.0)
MCH: 21.3 pg — AB (ref 26.0–34.0)
MCHC: 30.3 g/dL (ref 30.0–36.0)
MCV: 70.2 fL — ABNORMAL LOW (ref 78.0–100.0)
Platelets: 233 10*3/uL (ref 150–400)
RBC: 4.09 MIL/uL — AB (ref 4.22–5.81)
RDW: 18.8 % — ABNORMAL HIGH (ref 11.5–15.5)
WBC: 7.8 10*3/uL (ref 4.0–10.5)

## 2016-09-29 LAB — GLUCOSE, CAPILLARY
GLUCOSE-CAPILLARY: 140 mg/dL — AB (ref 65–99)
Glucose-Capillary: 117 mg/dL — ABNORMAL HIGH (ref 65–99)

## 2016-09-29 LAB — CLOTEST (H. PYLORI), BIOPSY: Helicobacter screen: NEGATIVE

## 2016-09-29 MED ORDER — RIVAROXABAN 20 MG PO TABS: ORAL_TABLET | ORAL | 1 refills | Status: DC

## 2016-09-29 MED ORDER — PANTOPRAZOLE SODIUM 40 MG PO TBEC: 40.0000 mg | DELAYED_RELEASE_TABLET | Freq: Every day | ORAL | 0 refills | Status: DC

## 2016-09-29 NOTE — Progress Notes (Signed)
Progress Note   Subjective  Chief Complaint:Gi Bleed  Pt found sitting up dressed and ready to go today because he "has to work" he feels well and is looking forward to leaving. He denies new complaints or concerns, he has continued with some melena x4 more episodes over the past 24 hours, pt states this is starting to return to "more normal" color.     Objective   Vital signs in last 24 hours: Temp:  [97.9 F (36.6 C)-98.4 F (36.9 C)] 98.2 F (36.8 C) (10/13 0613) Pulse Rate:  [65-79] 68 (10/13 0613) Resp:  [16-20] 20 (10/13 0613) BP: (92-139)/(48-80) 126/80 (10/13 0613) SpO2:  [95 %-100 %] 98 % (10/13 4944) Weight:  [227 lb (103 kg)] 227 lb (103 kg) (10/12 1232) Last BM Date: 09/28/16 General: Caucasian male in NAD Heart:  Regular rate and rhythm; no murmurs Lungs: Respirations even and unlabored, lungs CTA bilaterally Abdomen:  Soft, nontender and nondistended. Normal bowel sounds. Extremities:  Without edema. Neurologic:  Alert and oriented,  grossly normal neurologically. Psych:  Cooperative. Normal mood and affect.  Intake/Output from previous day: 10/12 0701 - 10/13 0700 In: 1120 [P.O.:720; I.V.:400] Out: 450 [Urine:450]  Lab Results:  Recent Labs  09/27/16 1844 09/27/16 1859 09/28/16 0405 09/29/16 0355  WBC 10.4  --  5.7 7.8  HGB 9.9* 11.2* 8.4* 8.7*  HCT 32.6* 33.0* 27.8* 28.7*  PLT 335  --  221 233   BMET  Recent Labs  09/27/16 1844 09/27/16 1859 09/28/16 0405  NA 136 138 137  K 4.0 3.9 3.9  CL 107 106 109  CO2 22  --  25  GLUCOSE 92 88 186*  BUN 32* 32* 28*  CREATININE 1.13 1.20 1.02  CALCIUM 10.2  --  9.5   LFT  Recent Labs  09/27/16 1844  PROT 7.1  ALBUMIN 4.0  AST 24  ALT 23  ALKPHOS 87  BILITOT 0.4   PT/INR  Recent Labs  09/28/16 0948  LABPROT 15.2  INR 1.19    Studies/Results: EGD 09/28/16-[place] Impression:               - Non-bleeding erosive gastropathy. Biopsied.                           - The  examination was otherwise normal.  Recommendation:           - Return patient to hospital ward for ongoing care.                           - Diabetic (ADA) diet.                           - Continue present medications.                           - Await pathology results.                           ORAL PPI                           STAY OFF XARELTO FOR A FEW MORE DAYS  TREAT H PYLORI IF +                           WILL ARRANGE GI F/U DR. Havery Moros                           STAY OF NSAIDS (TOOK IBUPROFEN RECENTLY)    Assessment / Plan:   Impression: 1. Acute GI bleed: Thought to be NSAID induced after starting ibuprofen for recent shoulder bursitis last week, exacerbated by chronic anticoagulation with Xarelto, melena 2-3 over the past 2-3 days, currently stable and asymptomatic from anemia standpoint, EGD yesterday as above 2. Blood loss anemia on polycythemia vera: Hemoglobin 9.1 at time of admission down from 13 in June, currently 8.7; due to above 3. Portal vein thrombosis: thought secondary to [facility], patient on Xarelto 4. ?Cirrhosis with liver lesion: Recently diagnosed, history of liver lesion noted on ultrasound and MRI, repeat imaging recommended in 3-6 months; Consider cyst vs other 5. OSA on CPAP  Plan: 1. Path results pending, our office will call when they return 2. Continue oral ppi 3. Stay off [place] for a few more days per Dr. Carlean Purl 4. Will arrange Gi f/u with dr. Havery Moros 5. Stay off nsaids 6. Will discuss above with Dr. Carlean Purl, ok with pt d/c today  Thank you for your kind consult, we will sign off   LOS: 0 days   Lavone Nian [facility]  09/29/2016, 9:33 AM  Pager # [phone]  Agree w/ Ms. Lemmon's note Gatha Mayer, MD, [facility] Gastroenterology [phone] (pager) [phone] after 5 PM, weekends and holidays  09/29/2016 8:19 PM

## 2016-09-29 NOTE — Progress Notes (Signed)
Discharge instructions reviewed with patient, questions answered, verbalized understanding.  Patient transported to front of hospital via wheelchair accompanied by Jena Gauss.  Patient plans to drive self home.

## 2016-10-01 NOTE — Discharge Summary (Signed)
Physician Discharge Summary  [name]. CHY:[id number] DOB: Apr 04, 1946 DOA: 09/27/2016  PCP: [name], MD  Admit date: 09/27/2016 Discharge date: 09/29/2016  Admitted From: Home.  Disposition:  Home.   Recommendations for Outpatient Follow-up:  1. Follow up with PCP in 1-2 weeks 2. Please obtain BMP/CBC in one week 3. Please follow up with Gi  Regarding the path results.  4. Please hold xarelto for atleast one week.  5. Please hold all NSAIDS .     Discharge Condition:stable.  CODE STATUS:dull code.  Diet recommendation: Heart Healthy / Carb Modified  Brief/Interim Summary: 70 year old admitted for anemia, hemoglobin of 9 , drop from baseline of 11.   Discharge Diagnoses:  Principal Problem:   Acute upper GI bleed Active Problems:   Polycythemia vera (HCC)   Severe obesity (BMI >= 40) (HCC)   Hypersomnia with sleep apnea   Essential hypertension   Portal vein thrombosis   Diabetes mellitus (HCC)   Liver cirrhosis secondary to NASH (HCC)   Acute blood loss anemia   Erosive gastritis with hemorrhage   Melena  Upper GI bleed : hemoglobin dropped from 11 to 8.4.  Gastroenterology consulted and he underwent EGD showing Non-bleeding erosive gastropathy. Biopsied. The examination was otherwise normal. Resume diabetic diet and watch hemoglobin . Recommend holding the xarelto for a few days as per Dr Carlean Purl, hold all NSAIDS.    Hypertension: controlled.  Diabetes mellitus: CBG (last 3)   Recent Labs (last 2 labs)    Recent Labs  09/28/16 0028 09/28/16 0627 09/28/16 1209  GLUCAP 237* 114* 95      Resume SSI.  Resumed carb modified diet.   Acute blood loss anemia: from GI bleed.  Hemoglobin stable at 8.4.  Monitor.   Liver cirrhosis secondary to NASH: Stable.    OSA on CPAP:   Polycythemia Vera: Stable.    Discharge Instructions  Discharge Instructions    Diet Carb Modified    Complete by:  As directed    Discharge  instructions    Complete by:  As directed    Follow up with Gastroenterology as recommended.       Medication List    STOP taking these medications   omeprazole 20 MG capsule Commonly known as:  PRILOSEC     TAKE these medications   [employer] B-12 500 MCG Subl Generic drug:  Cyanocobalamin Place 1 tablet under the tongue daily with breakfast.   desmopressin 0.2 MG tablet Commonly known as:  DDAVP Take 0.4 mg by mouth at bedtime.   glimepiride 2 MG tablet Commonly known as:  AMARYL Take 2-4 mg by mouth 2 (two) times daily. Take 85ms in the morning and 23m at night   KENALOG IJ Inject as directed once.   metFORMIN 500 MG 24 hr tablet Commonly known as:  GLUCOPHAGE-XR Take 500 mg by mouth 2 (two) times daily.   ONE TOUCH ULTRA TEST test strip Generic drug:  glucose blood by Other route as needed.   pantoprazole 40 MG tablet Commonly known as:  PROTONIX Take 1 tablet (40 mg total) by mouth daily before breakfast.   PRESCRIPTION MEDICATION every 8 (eight) weeks. Gets injections in right eye at dr's office every 8 weeks   rivaroxaban 20 MG Tabs tablet Commonly known as:  XARELTO Hold medication for one week, please resume it after follow up with GI. What changed:  how much to take  how to take this  when to take this  additional instructions  rosuvastatin 20 MG tablet Commonly known as:  CRESTOR Take 20 mg by mouth at bedtime.   tobramycin 0.3 % ophthalmic solution Commonly known as:  TOBREX Place 1 drop into the right eye every 4 (four) hours. X 5 days What changed:  additional instructions   valsartan-hydrochlorothiazide 320-25 MG tablet Commonly known as:  DIOVAN-HCT Take 1 tablet by mouth daily with breakfast.   verapamil 240 MG CR tablet Commonly known as:  CALAN-SR Take 240 mg by mouth daily.   VIAGRA 100 MG tablet Generic drug:  sildenafil Take 100 mg by mouth daily as needed for erectile dysfunction.      Follow-up Information     [name], MD. Schedule an appointment as soon as possible for a visit in 1 week(s).   Specialty:  Internal Medicine Contact information: [address] [place] [address] [phone]          No Known Allergies  Consultations: Gastroenterology.   Procedures/Studies: EGD   Subjective:  No new complaints.  Discharge Exam: Vitals:   09/28/16 2120 09/29/16 0613  BP: 117/61 126/80  Pulse:  68  Resp:  20  Temp:  98.2 F (36.8 C)   Vitals:   09/28/16 1400 09/28/16 2110 09/28/16 2120 09/29/16 0613  BP: 139/75 (!) 92/48 117/61 126/80  Pulse: 65 79  68  Resp: 18 20  20   Temp: 98 F (36.7 C) 98.2 F (36.8 C)  98.2 F (36.8 C)  TempSrc: Oral Oral  Oral  SpO2: 98% 95%  98%  Weight:      Height:        General: Pt is alert, awake, not in acute distress Cardiovascular: RRR, S1/S2 +, no rubs, no gallops Respiratory: CTA bilaterally, no wheezing, no rhonchi Abdominal: Soft, NT, ND, bowel sounds + Extremities: no edema, no cyanosis    The results of significant diagnostics from this hospitalization (including imaging, microbiology, ancillary and laboratory) are listed below for reference.     Microbiology: No results found for this or any previous visit (from the past 240 hour(s)).   Labs: BNP (last 3 results) No results for input(s): BNP in the last 8760 hours. Basic Metabolic Panel:  Recent Labs Lab 09/27/16 1844 09/27/16 1859 09/28/16 0405  NA 136 138 137  K 4.0 3.9 3.9  CL 107 106 109  CO2 22  --  25  GLUCOSE 92 88 186*  BUN 32* 32* 28*  CREATININE 1.13 1.20 1.02  CALCIUM 10.2  --  9.5   Liver Function Tests:  Recent Labs Lab 09/27/16 1844  AST 24  ALT 23  ALKPHOS 87  BILITOT 0.4  PROT 7.1  ALBUMIN 4.0   No results for input(s): LIPASE, AMYLASE in the last 168 hours. No results for input(s): AMMONIA in the last 168 hours. CBC:  Recent Labs Lab 09/27/16 1455 09/27/16 1844 09/27/16 1859 09/28/16 0405 09/29/16 0355   WBC 8.8 10.4  --  5.7 7.8  NEUTROABS 6.4  --   --   --   --   HGB 9.1* 9.9* 11.2* 8.4* 8.7*  HCT 29.0* 32.6* 33.0* 27.8* 28.7*  MCV 67.0 Repeated and verified X2.* 67.8*  --  70.4* 70.2*  PLT 296.0 335  --  221 233   Cardiac Enzymes: No results for input(s): CKTOTAL, CKMB, CKMBINDEX, TROPONINI in the last 168 hours. BNP: Invalid input(s): POCBNP CBG:  Recent Labs Lab 09/28/16 0627 09/28/16 1209 09/28/16 1821 09/29/16 0140 09/29/16 0607  GLUCAP 114* 95 176* 140* 117*  D-Dimer No results for input(s): DDIMER in the last 72 hours. Hgb A1c No results for input(s): HGBA1C in the last 72 hours. Lipid Profile No results for input(s): CHOL, HDL, LDLCALC, TRIG, CHOLHDL, LDLDIRECT in the last 72 hours. Thyroid function studies No results for input(s): TSH, T4TOTAL, T3FREE, THYROIDAB in the last 72 hours.  Invalid input(s): FREET3 Anemia work up No results for input(s): VITAMINB12, FOLATE, FERRITIN, TIBC, IRON, RETICCTPCT in the last 72 hours. Urinalysis    Component Value Date/Time   COLORURINE AMBER (A) 04/29/2016 1855   APPEARANCEUR CLEAR 04/29/2016 1855   LABSPEC >1.046 (H) 04/29/2016 1855   PHURINE 5.0 04/29/2016 1855   GLUCOSEU NEGATIVE 04/29/2016 1855   HGBUR NEGATIVE 04/29/2016 1855   BILIRUBINUR NEGATIVE 04/29/2016 1855   KETONESUR NEGATIVE 04/29/2016 1855   PROTEINUR NEGATIVE 04/29/2016 1855   UROBILINOGEN 0.2 02/02/2009 0805   NITRITE NEGATIVE 04/29/2016 1855   LEUKOCYTESUR NEGATIVE 04/29/2016 1855   Sepsis Labs Invalid input(s): PROCALCITONIN,  WBC,  LACTICIDVEN Microbiology No results found for this or any previous visit (from the past 240 hour(s)).   Time coordinating discharge: Over 30 minutes  SIGNED:   Hosie Poisson, MD  [facility] 10/01/2016, 9:52 PM Pager   If 7PM-7AM, please contact night-coverage [email or web address] Password [email or web address]

## 2016-10-11 ENCOUNTER — Encounter: Payer: Self-pay | Admitting: Gastroenterology

## 2016-10-11 ENCOUNTER — Ambulatory Visit (INDEPENDENT_AMBULATORY_CARE_PROVIDER_SITE_OTHER): Payer: 59 | Admitting: Gastroenterology

## 2016-10-11 ENCOUNTER — Other Ambulatory Visit (INDEPENDENT_AMBULATORY_CARE_PROVIDER_SITE_OTHER): Payer: 59

## 2016-10-11 VITALS — BP 118/68 | HR 64 | Ht 66.0 in | Wt 233.0 lb

## 2016-10-11 DIAGNOSIS — I81 Portal vein thrombosis: Secondary | ICD-10-CM

## 2016-10-11 DIAGNOSIS — K922 Gastrointestinal hemorrhage, unspecified: Secondary | ICD-10-CM | POA: Diagnosis not present

## 2016-10-11 DIAGNOSIS — D45 Polycythemia vera: Secondary | ICD-10-CM

## 2016-10-11 DIAGNOSIS — R932 Abnormal findings on diagnostic imaging of liver and biliary tract: Secondary | ICD-10-CM | POA: Diagnosis not present

## 2016-10-11 DIAGNOSIS — Z23 Encounter for immunization: Secondary | ICD-10-CM | POA: Diagnosis not present

## 2016-10-11 LAB — CBC WITH DIFFERENTIAL/PLATELET
Basophils Absolute: 0 10*3/uL (ref 0.0–0.1)
Basophils Relative: 0.3 % (ref 0.0–3.0)
Eosinophils Absolute: 0.1 10*3/uL (ref 0.0–0.7)
Eosinophils Relative: 1.4 % (ref 0.0–5.0)
HCT: 29.8 % — ABNORMAL LOW (ref 39.0–52.0)
Hemoglobin: 9.3 g/dL — ABNORMAL LOW (ref 13.0–17.0)
Lymphocytes Relative: 12.2 % (ref 12.0–46.0)
Lymphs Abs: 0.8 10*3/uL (ref 0.7–4.0)
MCHC: 31.3 g/dL (ref 30.0–36.0)
MCV: 67.2 fl — ABNORMAL LOW (ref 78.0–100.0)
Monocytes Absolute: 0.9 10*3/uL (ref 0.1–1.0)
Monocytes Relative: 12.3 % — ABNORMAL HIGH (ref 3.0–12.0)
Neutro Abs: 5.1 10*3/uL (ref 1.4–7.7)
Neutrophils Relative %: 73.8 % (ref 43.0–77.0)
Platelets: 286 10*3/uL (ref 150.0–400.0)
RBC: 4.43 Mil/uL (ref 4.22–5.81)
RDW: 19.8 % — ABNORMAL HIGH (ref 11.5–15.5)
WBC: 6.9 10*3/uL (ref 4.0–10.5)

## 2016-10-11 LAB — PROTIME-INR
INR: 1.1 ratio — AB (ref 0.8–1.0)
PROTHROMBIN TIME: 11.4 s (ref 9.6–13.1)

## 2016-10-11 NOTE — Patient Instructions (Signed)
If you are age 71 or older, your body mass index should be between 23-30. Your Body mass index is 37.61 kg/m. If this is out of the aforementioned range listed, please consider follow up with your Primary Care Provider.  If you are age 69 or younger, your body mass index should be between 19-25. Your Body mass index is 37.61 kg/m. If this is out of the aformentioned range listed, please consider follow up with your Primary Care Provider.   Your physician has requested that you go to the basement for lab work before leaving today:  You received your Hepatitis A/B vaccination today. You will need to return in 31 days for your next injection.

## 2016-10-11 NOTE — Progress Notes (Signed)
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 He was started on anticoagulation with Xarelto at that time. Incidentally noted at that time on imaging or changes concerning for underlying cirrhosis of the liver, however the spleen size was normal. He followed up with one of our PAs in recent months and underwent ultrasound elastography showing F3/4 fibrosis and possible changes of cirrhosis. Along with this was an indeterminate liver lesion, for which the patient had a follow-up MRI of the liver which showed this appeared to be a benign cystic lesion. AFP was normal at the time. Radiology recommended interval MRI in 3-6 months given this appeared to be rather new compared to previous imaging. The patient also had some labs done to evaluate for chronic liver diseases which appeared negative to date other than mildly positive ANA.   In the interim since the patient was initially seen he developed dark stools and anemia all taking his Xarelto. He was admitted a few weeks ago to the hospital, he had an EGD done on 09/28/16 - multiple erosions / gastritis, benign fundic gland polyps, no varices noted. Clo test negative for H pylori. Patient had recently been taking NSAIDs for bursitis. It was thought perhaps he had gastritis related to NSAID use causing his symptoms he was started on proton pump inhibitor Xarelto was held his stools returned to normal color.  HE reports he has been feeling normal since discharge. No further in the stools. Normal brown stools. He has not been on blood thinner since discharge, he has not resumed Xarelto however. It was planned that he take this for a year or so after the initial diagnosis of his clot. Interval MRI showed thrombosis appears to have resolved. He has been on protonix every day since hospitalization. He is not taking further NSAIDs.  Mother had  cirrhosis of the liver. NAFLD per patient. No other FH of liver disease. Rare alcohol use. He denies any routine or daily alcohol use. He thinks he has been told he had polycythemia for 8-10 years or so. This is the first blood clot he has ever had. He is received phlebotomy for this in the past, and his iron levels are normal  Screening colonoscopy completed on 12/10/15. Independent review of report and images shows 6 small cecal polyps, moderate diverticulosis in the sigmoid colon and internal hemorrhoids. Pathology revealed tubular adenomas.   Past Medical History:  Diagnosis Date  . DM (diabetes mellitus) ([place])   . GERD (gastroesophageal reflux disease)   . Hiatal hernia   . History of shingles 04/03/2014  . HTN (hypertension)   . Hyperlipidemia   . Insomnia    resolved by using CPAP  . 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 Macular degeneration   . Nocturia more than twice per night 11/11/2014   For 6 month, 5 nocturias a night.   . Obesity   . 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 Rhinitis   . Situational depression   . Sleep apnea    uses CPAP every night  . Tubular adenoma 12/10/2015   6 cecum polyps     Past Surgical History:  Procedure Laterality Date  . CARDIAC CATHETERIZATION     greater 10 yrs ago, normal  . CARPAL TUNNEL RELEASE     bilateral  . COLONOSCOPY  06/2005   [facility] Dr Lajoyce [place]  . ESOPHAGOGASTRODUODENOSCOPY (EGD)  WITH PROPOFOL N/A 09/28/2016   Procedure: ESOPHAGOGASTRODUODENOSCOPY (EGD) WITH PROPOFOL;  Surgeon: Gatha Mayer, MD;  Location: [facility];  Service: Endoscopy;  Laterality: N/A;  . ROTATOR CUFF REPAIR     right  . TENDON REPAIR     left arm  . TONSILLECTOMY    . TOTAL KNEE ARTHROPLASTY Right 2010  . WISDOM TOOTH EXTRACTION     Family History  Problem Relation Age of Onset  . Colon cancer    . Heart failure    . Heart attack    . Lung disease Father     history  . Diabetes Mother     history  . Stroke Mother      history  . Colon polyps Neg Hx   . Rectal cancer Neg Hx   . Stomach cancer Neg Hx   . Esophageal cancer Neg Hx    Social History  Substance Use Topics  . Smoking status: Never Smoker  . Smokeless tobacco: Never Used     Comment: never used tobacco  . Alcohol use 0.0 oz/week     Comment: rarely    Current Outpatient Prescriptions  Medication Sig Dispense Refill  . [employer] B-12 500 MCG SUBL Place 1 tablet under the tongue daily with breakfast.   12  . desmopressin (DDAVP) 0.2 MG tablet Take 0.4 mg by mouth at bedtime.     [name] glimepiride (AMARYL) 2 MG tablet Take 2-4 mg by mouth 2 (two) times daily. Take 71ms in the morning and 295m at night    . metFORMIN (GLUCOPHAGE-XR) 500 MG 24 hr tablet Take 500 mg by mouth 2 (two) times daily.     . ONE TOUCH ULTRA TEST test strip by Other route as needed.     . pantoprazole (PROTONIX) 40 MG tablet Take 1 tablet (40 mg total) by mouth daily before breakfast. 30 tablet 0  . PRESCRIPTION MEDICATION every 8 (eight) weeks. Gets injections in right eye at dr's office every 8 weeks    . rosuvastatin (CRESTOR) 20 MG tablet Take 20 mg by mouth at bedtime.     . Marland Kitchenobramycin (TOBREX) 0.3 % ophthalmic solution Place 1 drop into the right eye every 4 (four) hours. X 5 days (Patient taking differently: Place 1 drop into the right eye every 4 (four) hours. Use 4 times daily the 2 days before, the day of, and the 2 days after eye injections) 5 mL 0  . Triamcinolone Acetonide (KENALOG IJ) Inject as directed once.    . valsartan-hydrochlorothiazide (DIOVAN-HCT) 320-25 MG per tablet Take 1 tablet by mouth daily with breakfast.     . verapamil (CALAN-SR) 240 MG CR tablet Take 240 mg by mouth daily.    . Marland KitchenIAGRA 100 MG tablet Take 100 mg by mouth daily as needed for erectile dysfunction.   1  . rivaroxaban (XARELTO) 20 MG TABS tablet Hold medication for one week, please resume it after follow up with GI. (Patient not taking: Reported on 10/11/2016) 30 tablet 1   No  current facility-administered medications for this visit.    No Known Allergies   Review of Systems: All systems reviewed and negative except where noted in HPI.   CBC Latest Ref Rng & Units 10/11/2016 09/29/2016 09/28/2016  WBC 4.0 - 10.5 K/uL 6.9 7.8 5.7  Hemoglobin 13.0 - 17.0 g/dL 9.3(L) 8.7(L) 8.4(L)  Hematocrit 39.0 - 52.0 % 29.8(L) 28.7(L) 27.8(L)  Platelets 150.0 - 400.0 K/uL 286.0 233 221   Lab Results  Component Value  Date   CREATININE 1.02 09/28/2016   BUN 28 (H) 09/28/2016   NA 137 09/28/2016   K 3.9 09/28/2016   CL 109 09/28/2016   CO2 25 09/28/2016    Lab Results  Component Value Date   ALT 23 09/27/2016   AST 24 09/27/2016   ALKPHOS 87 09/27/2016   BILITOT 0.4 09/27/2016     Physical Exam: BP 118/68   Pulse 64   Ht 5' 6"  (1.676 m)   Wt 233 lb (105.7 kg)   BMI 37.61 kg/m  Constitutional: Pleasant,well-developed, male in no acute distress. HEENT: Normocephalic and atraumatic. Conjunctivae are normal. No scleral icterus. Neck supple.  Cardiovascular: Normal rate, regular rhythm.  Pulmonary/chest: Effort normal and breath sounds normal. No wheezing, rales or rhonchi. Abdominal: Soft, obese / protuberant abdomen, nontender. There are no masses palpable. Difficult to assess for hepatomegaly given protuberant abdomen. Extremities: no edema Lymphadenopathy: No cervical adenopathy noted. Neurological: Alert and oriented to person place and time. Skin: Skin is warm and dry. No rashes noted. Psychiatric: Normal mood and affect. Behavior is normal.   ASSESSMENT AND PLAN: 70 year old male with a complicated medical history to include polycythemia vera, associated with portal and splenic vein thrombosis with splenic infarcts, on anticoagulation who recently presented with a GI bleed thought to be due to gastritis in the setting of NSAID use. He has a questionable diagnosis of cirrhosis based off imaging, no varices were seen on recent EGD. Symptoms of bleeding  have resolved, his hemoglobin is uptrending.  GI bleed - on protonix, Xarelto held, all symptoms have resolved with uptrending Hgb. Okay to resume Xarelto today but monitor symptoms closely. If he has any recurrence of symptoms he needs to contact us for re-evaluation and stop Xarelto. Continue protonix for now daily. He will avoid all NSAIDs. Colonoscopy up to date.  Suspected cirrhosis - his liver appears cirrhotic, elastography with F3/F4 changes, however spleen is normal size, and his platelets are normal, as well as coagulation studies. His LFTs are normal. His workup for chronic liver diseases has been unremarkable so far, and his imaging does not show steatosis despite his weight. It's possible this could be related to his polycythemia diagnosis. I'm going to complete his serologic workup for chronic liver diseases with adding a few additional labs. Otherwise, in order to clarify if he has cirrhosis given conflicting findings, and what is causing it if cirrhosis is present, I discussed liver biopsy with him. His mother had cirrhosis of the liver, he is quite anxious about this issue & potential etiologies, and he wanted to proceed with a liver biopsy. He understands he will need to hold anticoagulation for this. I'll await his labs first and contact him for scheduling if interested. It may be useful to do this via transjugular approach to obtain portal pressures as well. He is not immune to hepatitis A or B we'll vaccinate him for that today.  Indeterminate liver lesion - I suspect this is a benign cyst, however Will repeat MRI liver in 3-6 months per radiology given this appears to be a new lesion, and continue to trend AFP which has been negative so far.  Portal vein/splenic vein thrombosis / polycythemia - we'll resume Xarelto today, he is going need to watch his stools very closely and stop this immediately if he notices any recurrence of bleeding. He should follow up with hematology to determine  the length of anticoagulation. Based on most recent imaging appears clots of resolved  Patient cell [phone], we  will contact him with results.   [place] Cellar, MD [facility] Pager [phone]

## 2016-10-12 ENCOUNTER — Other Ambulatory Visit: Payer: Self-pay

## 2016-10-12 DIAGNOSIS — D45 Polycythemia vera: Secondary | ICD-10-CM

## 2016-10-12 DIAGNOSIS — K922 Gastrointestinal hemorrhage, unspecified: Secondary | ICD-10-CM

## 2016-10-13 LAB — CERULOPLASMIN: CERULOPLASMIN: 26 mg/dL (ref 18–36)

## 2016-10-13 LAB — IGG: IgG (Immunoglobin G), Serum: 828 mg/dL (ref 694–1618)

## 2016-10-13 LAB — ALPHA-1-ANTITRYPSIN: A1 ANTITRYPSIN SER: 176 mg/dL (ref 83–199)

## 2016-10-16 ENCOUNTER — Ambulatory Visit: Payer: Medicare Other | Admitting: Hematology & Oncology

## 2016-10-16 ENCOUNTER — Other Ambulatory Visit: Payer: Medicare Other

## 2016-10-18 ENCOUNTER — Telehealth: Payer: Self-pay | Admitting: Neurology

## 2016-10-18 NOTE — Telephone Encounter (Signed)
I spoke to pt and advised him that per Dr. Brett Fairy, he can use the deionized water for his cpap, but he can't drink it. Pt says that he still doesn't understand why he can't drink it but can inhale it. I transferred the call at this point to Dr. Brett Fairy.

## 2016-10-18 NOTE — Telephone Encounter (Signed)
Please tell him that he can use the iron iced water for his CPAP machine. D ionized water is not safe to drink because it can deplete the body of electrolytes. It is safe to inhale. CD

## 2016-10-18 NOTE — Telephone Encounter (Signed)
Pt called to advise he is in [place] today and the resort is trying to make him use deionized water that can't be drank. It is used for batteries and steam irons.  [place]  He wants to know if he can't drink it why would he inhale it.  He said he has not slept in 2 nights. He doesn't leave until Sunday.  He wants to know what Dr D thinks. Please call

## 2016-10-18 NOTE — Telephone Encounter (Signed)
I spoke to the patient by phone and explained to him that it is not safe to drink the ionized water or distilled water due to the electrolyte imbalances can cause been ingested. I explained again that it is completely safe to use in his CPAP machine and to inhale and he will be able to use this on his location. The patient signaled understanding.CD

## 2016-10-24 ENCOUNTER — Encounter: Payer: Self-pay | Admitting: Hematology & Oncology

## 2016-10-25 ENCOUNTER — Other Ambulatory Visit (HOSPITAL_BASED_OUTPATIENT_CLINIC_OR_DEPARTMENT_OTHER): Payer: 59

## 2016-10-25 ENCOUNTER — Encounter: Payer: Self-pay | Admitting: Hematology & Oncology

## 2016-10-25 ENCOUNTER — Ambulatory Visit (HOSPITAL_BASED_OUTPATIENT_CLINIC_OR_DEPARTMENT_OTHER): Payer: 59 | Admitting: Hematology & Oncology

## 2016-10-25 VITALS — BP 108/46 | HR 70 | Temp 97.8°F | Resp 16 | Ht 66.0 in | Wt 235.0 lb

## 2016-10-25 DIAGNOSIS — I81 Portal vein thrombosis: Secondary | ICD-10-CM

## 2016-10-25 DIAGNOSIS — K7581 Nonalcoholic steatohepatitis (NASH): Secondary | ICD-10-CM

## 2016-10-25 DIAGNOSIS — D751 Secondary polycythemia: Secondary | ICD-10-CM | POA: Diagnosis not present

## 2016-10-25 DIAGNOSIS — K7460 Unspecified cirrhosis of liver: Secondary | ICD-10-CM

## 2016-10-25 DIAGNOSIS — R160 Hepatomegaly, not elsewhere classified: Secondary | ICD-10-CM

## 2016-10-25 DIAGNOSIS — D45 Polycythemia vera: Secondary | ICD-10-CM

## 2016-10-25 LAB — CBC WITH DIFFERENTIAL (CANCER CENTER ONLY)
BASO#: 0 10*3/uL (ref 0.0–0.2)
BASO%: 0.5 % (ref 0.0–2.0)
EOS ABS: 0.2 10*3/uL (ref 0.0–0.5)
EOS%: 2.4 % (ref 0.0–7.0)
HEMATOCRIT: 29.5 % — AB (ref 38.7–49.9)
HEMOGLOBIN: 8.9 g/dL — AB (ref 13.0–17.1)
LYMPH#: 0.9 10*3/uL (ref 0.9–3.3)
LYMPH%: 14.7 % (ref 14.0–48.0)
MCH: 20.9 pg — AB (ref 28.0–33.4)
MCHC: 30.2 g/dL — ABNORMAL LOW (ref 32.0–35.9)
MCV: 69 fL — AB (ref 82–98)
MONO#: 0.9 10*3/uL (ref 0.1–0.9)
MONO%: 14.1 % — AB (ref 0.0–13.0)
NEUT%: 68.3 % (ref 40.0–80.0)
NEUTROS ABS: 4.3 10*3/uL (ref 1.5–6.5)
Platelets: 258 10*3/uL (ref 145–400)
RBC: 4.25 10*6/uL (ref 4.20–5.70)
RDW: 19 % — ABNORMAL HIGH (ref 11.1–15.7)
WBC: 6.3 10*3/uL (ref 4.0–10.0)

## 2016-10-25 LAB — COMPREHENSIVE METABOLIC PANEL (CC13)
ALBUMIN: 4.1 g/dL (ref 3.5–4.8)
ALT: 25 IU/L (ref 0–44)
AST (SGOT): 24 IU/L (ref 0–40)
Albumin/Globulin Ratio: 1.7 (ref 1.2–2.2)
Alkaline Phosphatase, S: 102 IU/L (ref 39–117)
BILIRUBIN TOTAL: 0.3 mg/dL (ref 0.0–1.2)
BUN / CREAT RATIO: 17 (ref 10–24)
BUN: 19 mg/dL (ref 8–27)
CHLORIDE: 103 mmol/L (ref 96–106)
Calcium, Ser: 10.4 mg/dL — ABNORMAL HIGH (ref 8.6–10.2)
Carbon Dioxide, Total: 24 mmol/L (ref 18–29)
Creatinine, Ser: 1.09 mg/dL (ref 0.76–1.27)
GFR calc non Af Amer: 68 mL/min/{1.73_m2} (ref >59)
GFR, EST AFRICAN AMERICAN: 79 mL/min/{1.73_m2} (ref >59)
GLUCOSE: 109 mg/dL — AB (ref 65–99)
Globulin, Total: 2.4 g/dL (ref 1.5–4.5)
Potassium, Ser: 4.1 mmol/L (ref 3.5–5.2)
Sodium: 134 mmol/L (ref 134–144)
TOTAL PROTEIN: 6.5 g/dL (ref 6.0–8.5)

## 2016-10-25 MED ORDER — RIVAROXABAN 10 MG PO TABS: ORAL_TABLET | ORAL | 8 refills | Status: DC

## 2016-10-25 NOTE — Progress Notes (Signed)
Hematology and Oncology Follow Up Visit  James [name] [id number] 03-Jun-1946 70 y.o. 10/25/2016   Principle Diagnosis:   Portal vein thrombus  Polycythemia vera  NASH with splenomegaly  Current Therapy:    Xarelto 10 mg by mouth daily-to be completed on 04/2017  Phlebotomy to maintain hematocrit less than 45%     Interim History:  James [name] is back for follow-up. Unfortunately, he was hospitalized since I last saw him. He had a GI bleed that was upper GI bleed back in October. He had been taking Motrin for bursitis. He began to have melena. He was hospitalized. He did have an upper endoscopy done. This showed some ulcers that were beginning to heal.   He was not transfused. He is not giving any IV iron. He is now on some oral iron from his family doctor.   He feels a little bit better.   He obviously was taken off his Xarelto. He was told that he go back onto his Xarelto. I think we can get him on 10 mg of Xarelto. I think that this would be reasonable.   He has no fever. He has no cough. He has no shortness of breath.  He and his wife just got back from [place]. He was at a "conference". He also enjoyed himself down there. He is nice and tanned.  Overall, his performance status is ECOG 1.  Medications:  Current Outpatient Prescriptions:  .  iron polysaccharides (NIFEREX) 150 MG capsule, Take 150 mg by mouth daily., Disp: , Rfl:  .  [employer]S B-12 500 MCG SUBL, Place 1 tablet under the tongue daily with breakfast. , Disp: , Rfl: 12 .  desmopressin (DDAVP) 0.2 MG tablet, Take 0.4 mg by mouth at bedtime. , Disp: , Rfl:  .  glimepiride (AMARYL) 2 MG tablet, Take 2-4 mg by mouth 2 (two) times daily. Take 63ms in the morning and 271m at night, Disp: , Rfl:  .  metFORMIN (GLUCOPHAGE-XR) 500 MG 24 hr tablet, Take 500 mg by mouth 2 (two) times daily. , Disp: , Rfl:  .  ONE TOUCH ULTRA TEST test strip, by Other route as needed. , Disp: , Rfl:  .  pantoprazole (PROTONIX) 40 MG tablet,  Take 1 tablet (40 mg total) by mouth daily before breakfast., Disp: 30 tablet, Rfl: 0 .  PRESCRIPTION MEDICATION, every 8 (eight) weeks. Gets injections in right eye at dr's office every 8 weeks, Disp: , Rfl:  .  rivaroxaban (XARELTO) 10 MG TABS tablet, Hold medication for one week, please resume it after follow up with GI., Disp: 30 tablet, Rfl: 8 .  rosuvastatin (CRESTOR) 20 MG tablet, Take 20 mg by mouth at bedtime. , Disp: , Rfl:  .  tobramycin (TOBREX) 0.3 % ophthalmic solution, Place 1 drop into the right eye every 4 (four) hours. X 5 days (Patient taking differently: Place 1 drop into the right eye every 4 (four) hours. Use 4 times daily the 2 days before, the day of, and the 2 days after eye injections), Disp: 5 mL, Rfl: 0 .  Triamcinolone Acetonide (KENALOG IJ), Inject as directed once., Disp: , Rfl:  .  valsartan-hydrochlorothiazide (DIOVAN-HCT) 320-25 MG per tablet, Take 1 tablet by mouth daily with breakfast. , Disp: , Rfl:  .  verapamil (CALAN-SR) 240 MG CR tablet, Take 240 mg by mouth daily., Disp: , Rfl:  .  VIAGRA 100 MG tablet, Take 100 mg by mouth daily as needed for erectile dysfunction. , Disp: , Rfl:  1  Allergies: No Known Allergies  Past Medical History, Surgical history, Social history, and Family History were reviewed and updated.  Review of Systems: As above  Physical Exam:  height is 5' 6"  (1.676 m) and weight is 235 lb (106.6 kg). His oral temperature is 97.8 F (36.6 C). His blood pressure is 108/46 (abnormal) and his pulse is 70. His respiration is 16.   Wt Readings from Last 3 Encounters:  10/25/16 235 lb (106.6 kg)  10/11/16 233 lb (105.7 kg)  09/28/16 227 lb (103 kg)     Obese white male in no obvious distress. Head exam shows no ocular or oral lesions. There are no palpable cervical or supraclavicular lymph nodes. Lungs are clear. Cardiac exam regular rate and rhythm with no murmurs, rubs or bruits. Abdomen is soft. He has good bowel sounds. There is no  guarding or rebound tenderness. He has no fluid wave. There is no palpable abdominal mass. His spleen tip cannot be palpated. Extremities shows no clubbing, cyanosis or edema. Back exam shows no tenderness over the spine, ribs or hips. Skin exam shows no rashes, ecchymoses or petechia. Neurological exam shows no focal neurological deficits.  Lab Results  Component Value Date   WBC 6.3 10/25/2016   HGB 8.9 (L) 10/25/2016   HCT 29.5 (L) 10/25/2016   MCV 69 (L) 10/25/2016   PLT 258 10/25/2016     Chemistry      Component Value Date/Time   NA 134 10/25/2016 1524   NA 138 08/11/2016 1423   K 4.1 10/25/2016 1524   K 4.1 08/11/2016 1423   CL 103 10/25/2016 1524   CO2 24 10/25/2016 1524   CO2 20 (L) 08/11/2016 1423   BUN 19 10/25/2016 1524   BUN 21.5 08/11/2016 1423   CREATININE 1.09 10/25/2016 1524   CREATININE 1.3 08/11/2016 1423      Component Value Date/Time   CALCIUM 10.4 (H) 10/25/2016 1524   CALCIUM 10.6 (H) 08/11/2016 1423   ALKPHOS 102 10/25/2016 1524   ALKPHOS 83 08/11/2016 1423   AST 24 10/25/2016 1524   AST 23 08/11/2016 1423   ALT 25 10/25/2016 1524   ALT 21 08/11/2016 1423   BILITOT 0.3 10/25/2016 1524   BILITOT 0.46 08/11/2016 1423         Impression and Plan: James [name] is a 70 year old with polycythemia. We've been very aggressive with control of his blood counts.   Clearly, the upper GI bleed was from him taking Motrin. He is not taking any Motrin again.  We will go ahead and get him back on to 10 mg of Xarelto. I think this would be very appropriate for him. I wanted to try to keep him on treatment until May 2018.   I'm sure that he is still iron deficient. He is on oral iron. We will follow up in about 3 weeks and recheck his counts and his iron studies.   I do not expect him to tell me that he had a GI bleed and was hospitalized. I told his wife to make sure that she calls me next time that he is hospitalized so that I can see him in the hospital.   I  spent about 30 minutes with he and his wife.  [name], MD 11/8/20175:57 PM

## 2016-10-26 ENCOUNTER — Other Ambulatory Visit (INDEPENDENT_AMBULATORY_CARE_PROVIDER_SITE_OTHER): Payer: 59

## 2016-10-26 ENCOUNTER — Other Ambulatory Visit: Payer: Self-pay

## 2016-10-26 DIAGNOSIS — D45 Polycythemia vera: Secondary | ICD-10-CM

## 2016-10-26 DIAGNOSIS — K922 Gastrointestinal hemorrhage, unspecified: Secondary | ICD-10-CM | POA: Diagnosis not present

## 2016-10-26 LAB — CBC WITH DIFFERENTIAL/PLATELET
Basophils Absolute: 0 10*3/uL (ref 0.0–0.1)
Basophils Relative: 0.2 % (ref 0.0–3.0)
EOS PCT: 2.5 % (ref 0.0–5.0)
Eosinophils Absolute: 0.1 10*3/uL (ref 0.0–0.7)
HCT: 28.9 % — ABNORMAL LOW (ref 39.0–52.0)
Hemoglobin: 9 g/dL — ABNORMAL LOW (ref 13.0–17.0)
LYMPHS ABS: 0.6 10*3/uL — AB (ref 0.7–4.0)
Lymphocytes Relative: 12 % (ref 12.0–46.0)
MCHC: 31.1 g/dL (ref 30.0–36.0)
MONOS PCT: 11.7 % (ref 3.0–12.0)
Monocytes Absolute: 0.6 10*3/uL (ref 0.1–1.0)
NEUTROS PCT: 73.6 % (ref 43.0–77.0)
Neutro Abs: 3.8 10*3/uL (ref 1.4–7.7)
Platelets: 275 10*3/uL (ref 150.0–400.0)
RBC: 4.35 Mil/uL (ref 4.22–5.81)
RDW: 19 % — ABNORMAL HIGH (ref 11.5–15.5)
WBC: 5.1 10*3/uL (ref 4.0–10.5)

## 2016-10-26 LAB — AFP TUMOR MARKER: AFP, SERUM, TUMOR MARKER: 1.4 ng/mL (ref 0.0–8.3)

## 2016-10-27 ENCOUNTER — Other Ambulatory Visit: Payer: Self-pay

## 2016-10-27 ENCOUNTER — Telehealth: Payer: Self-pay | Admitting: Gastroenterology

## 2016-10-27 MED ORDER — PANTOPRAZOLE SODIUM 40 MG PO TBEC: 40.0000 mg | DELAYED_RELEASE_TABLET | Freq: Every day | ORAL | 3 refills | Status: DC

## 2016-10-27 NOTE — Telephone Encounter (Signed)
Pt is currently on Protonix. Refill sent.

## 2016-10-31 LAB — HEMOCHROMATOSIS DNA-PCR(C282Y,H63D)

## 2016-11-13 ENCOUNTER — Ambulatory Visit (INDEPENDENT_AMBULATORY_CARE_PROVIDER_SITE_OTHER): Payer: PRIVATE HEALTH INSURANCE | Admitting: Gastroenterology

## 2016-11-13 DIAGNOSIS — K7400 Hepatic fibrosis, unspecified: Secondary | ICD-10-CM

## 2016-11-13 DIAGNOSIS — I81 Portal vein thrombosis: Secondary | ICD-10-CM

## 2016-11-13 DIAGNOSIS — Z23 Encounter for immunization: Secondary | ICD-10-CM

## 2016-11-13 DIAGNOSIS — R932 Abnormal findings on diagnostic imaging of liver and biliary tract: Secondary | ICD-10-CM

## 2016-11-16 ENCOUNTER — Other Ambulatory Visit: Payer: Self-pay

## 2016-11-17 ENCOUNTER — Other Ambulatory Visit: Payer: Self-pay

## 2016-11-20 ENCOUNTER — Encounter: Payer: Self-pay | Admitting: Hematology & Oncology

## 2016-11-20 ENCOUNTER — Ambulatory Visit (HOSPITAL_BASED_OUTPATIENT_CLINIC_OR_DEPARTMENT_OTHER): Payer: PRIVATE HEALTH INSURANCE | Admitting: Hematology & Oncology

## 2016-11-20 ENCOUNTER — Other Ambulatory Visit (HOSPITAL_BASED_OUTPATIENT_CLINIC_OR_DEPARTMENT_OTHER): Payer: PRIVATE HEALTH INSURANCE

## 2016-11-20 VITALS — BP 129/47 | HR 73 | Temp 97.8°F | Wt 242.0 lb

## 2016-11-20 DIAGNOSIS — D45 Polycythemia vera: Secondary | ICD-10-CM

## 2016-11-20 DIAGNOSIS — I81 Portal vein thrombosis: Secondary | ICD-10-CM

## 2016-11-20 DIAGNOSIS — D751 Secondary polycythemia: Secondary | ICD-10-CM | POA: Diagnosis not present

## 2016-11-20 DIAGNOSIS — E611 Iron deficiency: Secondary | ICD-10-CM | POA: Diagnosis not present

## 2016-11-20 DIAGNOSIS — D500 Iron deficiency anemia secondary to blood loss (chronic): Secondary | ICD-10-CM | POA: Insufficient documentation

## 2016-11-20 DIAGNOSIS — K922 Gastrointestinal hemorrhage, unspecified: Secondary | ICD-10-CM

## 2016-11-20 HISTORY — DX: Iron deficiency anemia secondary to blood loss (chronic): D50.0

## 2016-11-20 LAB — COMPREHENSIVE METABOLIC PANEL
ALBUMIN: 3.7 g/dL (ref 3.5–5.0)
ALK PHOS: 109 U/L (ref 40–150)
ALT: 19 U/L (ref 0–55)
ANION GAP: 9 meq/L (ref 3–11)
AST: 19 U/L (ref 5–34)
BILIRUBIN TOTAL: 0.43 mg/dL (ref 0.20–1.20)
BUN: 17.2 mg/dL (ref 7.0–26.0)
CALCIUM: 10.1 mg/dL (ref 8.4–10.4)
CO2: 21 mEq/L — ABNORMAL LOW (ref 22–29)
Chloride: 107 mEq/L (ref 98–109)
Creatinine: 1.2 mg/dL (ref 0.7–1.3)
EGFR: 61 mL/min/{1.73_m2} — AB (ref >90)
GLUCOSE: 91 mg/dL (ref 70–140)
Potassium: 4.3 mEq/L (ref 3.5–5.1)
Sodium: 137 mEq/L (ref 136–145)
TOTAL PROTEIN: 7 g/dL (ref 6.4–8.3)

## 2016-11-20 LAB — CBC WITH DIFFERENTIAL (CANCER CENTER ONLY)
BASO#: 0.1 10*3/uL (ref 0.0–0.2)
BASO%: 0.7 % (ref 0.0–2.0)
EOS ABS: 0.2 10*3/uL (ref 0.0–0.5)
EOS%: 2.3 % (ref 0.0–7.0)
HCT: 30.4 % — ABNORMAL LOW (ref 38.7–49.9)
HEMOGLOBIN: 9 g/dL — AB (ref 13.0–17.1)
LYMPH#: 1.1 10*3/uL (ref 0.9–3.3)
LYMPH%: 14.3 % (ref 14.0–48.0)
MCH: 20 pg — AB (ref 28.0–33.4)
MCHC: 29.6 g/dL — ABNORMAL LOW (ref 32.0–35.9)
MCV: 67 fL — AB (ref 82–98)
MONO#: 1 10*3/uL — AB (ref 0.1–0.9)
MONO%: 14 % — ABNORMAL HIGH (ref 0.0–13.0)
NEUT%: 68.7 % (ref 40.0–80.0)
NEUTROS ABS: 5.1 10*3/uL (ref 1.5–6.5)
Platelets: 264 10*3/uL (ref 145–400)
RBC: 4.51 10*6/uL (ref 4.20–5.70)
RDW: 18.7 % — ABNORMAL HIGH (ref 11.1–15.7)
WBC: 7.4 10*3/uL (ref 4.0–10.0)

## 2016-11-20 NOTE — Progress Notes (Signed)
Hematology and Oncology Follow Up Visit  Tor Tsuda [id number] [birth year] 70 y.o. 11/20/2016   Principle Diagnosis:   Portal vein thrombus  Polycythemia vera  NASH with splenomegaly  Current Therapy:    Xarelto 10 mg by mouth daily-to be completed on 04/2017  Phlebotomy to maintain hematocrit less than 45%     Interim History:  Mr. Kanaan is back for follow-up. He is not feeling well. He just feels very tired. He is iron deficient from my point of view. He was put on some oral iron by his family doctor. This will not work as he is on a PPI.  He just has no energy. His stamina is quite low.  He is working. He did have a decent Thanksgiving although he just felt tired.  He has not noted any obvious melena.  He has had no fever. He's had no cough or shortness of breath. He's had no change in bowel or bladder habits.  Overall, his performance status is ECOG 1.  Medications:  Current Outpatient Prescriptions:  .  CVS B-12 500 MCG SUBL, Place 1 tablet under the tongue daily with breakfast. , Disp: , Rfl: 12 .  desmopressin (DDAVP) 0.2 MG tablet, Take 0.4 mg by mouth at bedtime. , Disp: , Rfl:  .  glimepiride (AMARYL) 2 MG tablet, Take 2-4 mg by mouth 2 (two) times daily. Take 68ms in the morning and 251m at night, Disp: , Rfl:  .  iron polysaccharides (NIFEREX) 150 MG capsule, Take 150 mg by mouth daily., Disp: , Rfl:  .  metFORMIN (GLUCOPHAGE-XR) 500 MG 24 hr tablet, Take 500 mg by mouth 2 (two) times daily. , Disp: , Rfl:  .  ONE TOUCH ULTRA TEST test strip, by Other route as needed. , Disp: , Rfl:  .  pantoprazole (PROTONIX) 40 MG tablet, Take 1 tablet (40 mg total) by mouth daily before breakfast., Disp: 30 tablet, Rfl: 3 .  PRESCRIPTION MEDICATION, every 8 (eight) weeks. Gets injections in right eye at dr's office every 8 weeks, Disp: , Rfl:  .  rivaroxaban (XARELTO) 10 MG TABS tablet, Hold medication for one week, please resume it after follow up with GI., Disp: 30  tablet, Rfl: 8 .  rosuvastatin (CRESTOR) 20 MG tablet, Take 20 mg by mouth at bedtime. , Disp: , Rfl:  .  tobramycin (TOBREX) 0.3 % ophthalmic solution, Place 1 drop into the right eye every 4 (four) hours. X 5 days (Patient taking differently: Place 1 drop into the right eye every 4 (four) hours. Use 4 times daily the 2 days before, the day of, and the 2 days after eye injections), Disp: 5 mL, Rfl: 0 .  Triamcinolone Acetonide (KENALOG IJ), Inject as directed once., Disp: , Rfl:  .  valsartan-hydrochlorothiazide (DIOVAN-HCT) 320-25 MG per tablet, Take 0.5 tablets by mouth daily with breakfast. , Disp: , Rfl:  .  verapamil (CALAN-SR) 240 MG CR tablet, Take 240 mg by mouth daily., Disp: , Rfl:  .  VIAGRA 100 MG tablet, Take 100 mg by mouth daily as needed for erectile dysfunction. , Disp: , Rfl: 1  Allergies: No Known Allergies  Past Medical History, Surgical history, Social history, and Family History were reviewed and updated.  Review of Systems: As above  Physical Exam:  weight is 242 lb (109.8 kg). His oral temperature is 97.8 F (36.6 C). His blood pressure is 129/47 (abnormal) and his pulse is 73.   Wt Readings from Last 3 Encounters:  11/20/16 242  lb (109.8 kg)  10/25/16 235 lb (106.6 kg)  10/11/16 233 lb (105.7 kg)     Obese white male in no obvious distress. Head exam shows no ocular or oral lesions. There are no palpable cervical or supraclavicular lymph nodes. Lungs are clear. Cardiac exam regular rate and rhythm with no murmurs, rubs or bruits. Abdomen is soft. He has good bowel sounds. There is no guarding or rebound tenderness. He has no fluid wave. There is no palpable abdominal mass. His spleen tip cannot be palpated. Extremities shows no clubbing, cyanosis or edema. Back exam shows no tenderness over the spine, ribs or hips. Skin exam shows no rashes, ecchymoses or petechia. Neurological exam shows no focal neurological deficits.  Lab Results  Component Value Date   WBC  7.4 11/20/2016   HGB 9.0 (L) 11/20/2016   HCT 30.4 (L) 11/20/2016   MCV 67 (L) 11/20/2016   PLT 264 11/20/2016     Chemistry      Component Value Date/Time   NA 134 10/25/2016 1524   NA 138 08/11/2016 1423   K 4.1 10/25/2016 1524   K 4.1 08/11/2016 1423   CL 103 10/25/2016 1524   CO2 24 10/25/2016 1524   CO2 20 (L) 08/11/2016 1423   BUN 19 10/25/2016 1524   BUN 21.5 08/11/2016 1423   CREATININE 1.09 10/25/2016 1524   CREATININE 1.3 08/11/2016 1423      Component Value Date/Time   CALCIUM 10.4 (H) 10/25/2016 1524   CALCIUM 10.6 (H) 08/11/2016 1423   ALKPHOS 102 10/25/2016 1524   ALKPHOS 83 08/11/2016 1423   AST 24 10/25/2016 1524   AST 23 08/11/2016 1423   ALT 25 10/25/2016 1524   ALT 21 08/11/2016 1423   BILITOT 0.3 10/25/2016 1524   BILITOT 0.46 08/11/2016 1423         Impression and Plan: Mr. Brafford is a 70 year old with polycythemia. We've been very aggressive with control of his blood counts.   I suspect that he is still iron deficient. His MCV is only 67. Under the microscope, he has microcytic red blood cells.   I did check his iron studies. I will get this back later on. Despite the fact that he has polycythemia, he actually may need iron right now. He is not going to absorb iron orally because he is on Protonix.   I think if we gave him I'm back intravenously, he will feel a whole lot better. I just want him to feel well for Christmas.   He is on the low-dose Xarelto until well with this.   I will plan to see him back myself in 6 weeks. We probably will get him in later on this week for IV iron. He is very much on board with IV iron as he wants to feel better.   [name], MD 12/4/20172:42 PM

## 2016-11-21 ENCOUNTER — Telehealth: Payer: Self-pay | Admitting: *Deleted

## 2016-11-21 ENCOUNTER — Other Ambulatory Visit: Payer: Self-pay | Admitting: *Deleted

## 2016-11-21 DIAGNOSIS — D500 Iron deficiency anemia secondary to blood loss (chronic): Secondary | ICD-10-CM

## 2016-11-21 LAB — RETICULOCYTES: RETICULOCYTE COUNT: 1.9 % (ref 0.6–2.6)

## 2016-11-21 LAB — FERRITIN: Ferritin: 7 ng/ml — ABNORMAL LOW (ref 22–316)

## 2016-11-21 LAB — IRON AND TIBC
%SAT: 4 % — AB (ref 20–55)
Iron: 19 ug/dL — ABNORMAL LOW (ref 42–163)
TIBC: 464 ug/dL — AB (ref 202–409)
UIBC: 445 ug/dL — AB (ref 117–376)

## 2016-11-21 NOTE — Telephone Encounter (Signed)
Called patient left phone mail message to let him know his iron is VERY low!! Need IV iron tomorrow and next week. Message sent to scheduler.

## 2016-11-22 ENCOUNTER — Telehealth: Payer: Self-pay | Admitting: Gastroenterology

## 2016-11-22 NOTE — Telephone Encounter (Signed)
Will call patient to review his blood work. Remains severely iron deficient. May need capsule endoscopy to clear the small bowel given relatively recent EGD and colonoscopy. On IV iron

## 2016-11-23 ENCOUNTER — Ambulatory Visit: Payer: PRIVATE HEALTH INSURANCE

## 2016-11-23 ENCOUNTER — Telehealth: Payer: Self-pay | Admitting: Gastroenterology

## 2016-11-23 DIAGNOSIS — D500 Iron deficiency anemia secondary to blood loss (chronic): Secondary | ICD-10-CM

## 2016-11-23 MED ORDER — SODIUM CHLORIDE 0.9 % IV SOLN: 510.0000 mg | Freq: Once | INTRAVENOUS | Status: DC

## 2016-11-23 MED ORDER — SODIUM CHLORIDE 0.9 % IV SOLN
Freq: Once | INTRAVENOUS | Status: AC
  Administered 2016-11-23: 08:00:00 via INTRAVENOUS

## 2016-11-23 NOTE — Telephone Encounter (Signed)
See other telephone note - being referred to IR for liver biopsy

## 2016-11-23 NOTE — Progress Notes (Signed)
Patient here for Feraheme infusion. Pharmacist was at a [facility] meeting this morning and not here to mix medicine.  Patient had a 10 oclock meeting with half hour drive so had to reschedule.  Did not get Feraheme.  IV removed .  Patient will reschedule.

## 2016-11-23 NOTE — Telephone Encounter (Signed)
James [name] I spoke with this patient at length today. His anemia is not improved, Hgb remains around 9, he remains on Xarelto, but having no obvious blood loss in the stools. Prior colonoscopy last year without a cause for anemia. He previously had melena and was not to have some gastritis, although it's possible he has had small bowel bleeding in the setting of Xarelto. He is profoundly iron deficient and getting IV iron tomorrow.  I am recommending a capsule endoscopy to clear his small bowel at this point, and will also obtain some views of the stomach with this exam. Can you please coordinate a capsule study for him.  Otherwise he is quite anxious to have the liver biopsy done prior to the end of the year due to insurance issues. I would prefer his Hgb to be higher in case he had bleeding from this and hopefully it is with the IV iron infusion. Can you please refer him to IR for transjugular liver biopsy, so they can see him and determine if they wish to proceed with it at this time. He would need to hold Xarelto for this and defer to them regarding the timing.   Thank you

## 2016-11-23 NOTE — Telephone Encounter (Signed)
Dr. Havery Moros please advise regarding liver biopsy.

## 2016-11-23 NOTE — Patient Instructions (Signed)
Ferumoxytol injection What is this medicine? FERUMOXYTOL is an iron complex. Iron is used to make healthy red blood cells, which carry oxygen and nutrients throughout the body. This medicine is used to treat iron deficiency anemia in people with chronic kidney disease. COMMON BRAND NAME(S): Feraheme What should I tell my health care provider before I take this medicine? They need to know if you have any of these conditions: -anemia not caused by low iron levels -high levels of iron in the blood -magnetic resonance imaging (MRI) test scheduled -an unusual or allergic reaction to iron, other medicines, foods, dyes, or preservatives -pregnant or trying to get pregnant -breast-feeding How should I use this medicine? This medicine is for injection into a vein. It is given by a health care professional in a hospital or clinic setting. Talk to your pediatrician regarding the use of this medicine in children. Special care may be needed. What if I miss a dose? It is important not to miss your dose. Call your doctor or health care professional if you are unable to keep an appointment. What may interact with this medicine? This medicine may interact with the following medications: -other iron products What should I watch for while using this medicine? Visit your doctor or healthcare professional regularly. Tell your doctor or healthcare professional if your symptoms do not start to get better or if they get worse. You may need blood work done while you are taking this medicine. You may need to follow a special diet. Talk to your doctor. Foods that contain iron include: whole grains/cereals, dried fruits, beans, or peas, leafy green vegetables, and organ meats (liver, kidney). What side effects may I notice from receiving this medicine? Side effects that you should report to your doctor or health care professional as soon as possible: -allergic reactions like skin rash, itching or hives, swelling of the  face, lips, or tongue -breathing problems -changes in blood pressure -feeling faint or lightheaded, falls -fever or chills -flushing, sweating, or hot feelings -swelling of the ankles or feet Side effects that usually do not require medical attention (report to your doctor or health care professional if they continue or are bothersome): -diarrhea -headache -nausea, vomiting -stomach pain Where should I keep my medicine? This drug is given in a hospital or clinic and will not be stored at home.  2017 Elsevier/Gold Standard (2016-01-06 12:41:49)  

## 2016-11-24 ENCOUNTER — Ambulatory Visit (HOSPITAL_BASED_OUTPATIENT_CLINIC_OR_DEPARTMENT_OTHER): Payer: PRIVATE HEALTH INSURANCE

## 2016-11-24 ENCOUNTER — Other Ambulatory Visit: Payer: Self-pay | Admitting: Family

## 2016-11-24 ENCOUNTER — Other Ambulatory Visit: Payer: Self-pay

## 2016-11-24 VITALS — BP 120/67 | HR 65 | Temp 98.2°F | Resp 18

## 2016-11-24 DIAGNOSIS — D509 Iron deficiency anemia, unspecified: Secondary | ICD-10-CM

## 2016-11-24 DIAGNOSIS — K7460 Unspecified cirrhosis of liver: Secondary | ICD-10-CM

## 2016-11-24 DIAGNOSIS — D500 Iron deficiency anemia secondary to blood loss (chronic): Secondary | ICD-10-CM | POA: Diagnosis not present

## 2016-11-24 DIAGNOSIS — K7581 Nonalcoholic steatohepatitis (NASH): Principal | ICD-10-CM

## 2016-11-24 MED ORDER — SODIUM CHLORIDE 0.9 % IV SOLN
Freq: Once | INTRAVENOUS | Status: AC
  Administered 2016-11-24: 12:00:00 via INTRAVENOUS

## 2016-11-24 MED ORDER — SODIUM CHLORIDE 0.9 % IV SOLN
510.0000 mg | Freq: Once | INTRAVENOUS | Status: AC
  Administered 2016-11-24: 510 mg via INTRAVENOUS
  Filled 2016-11-24: qty 17

## 2016-11-24 NOTE — Telephone Encounter (Signed)
Thanks very much.

## 2016-11-24 NOTE — Patient Instructions (Signed)
Ferumoxytol injection What is this medicine? FERUMOXYTOL is an iron complex. Iron is used to make healthy red blood cells, which carry oxygen and nutrients throughout the body. This medicine is used to treat iron deficiency anemia in people with chronic kidney disease. COMMON BRAND NAME(S): Feraheme What should I tell my health care provider before I take this medicine? They need to know if you have any of these conditions: -anemia not caused by low iron levels -high levels of iron in the blood -magnetic resonance imaging (MRI) test scheduled -an unusual or allergic reaction to iron, other medicines, foods, dyes, or preservatives -pregnant or trying to get pregnant -breast-feeding How should I use this medicine? This medicine is for injection into a vein. It is given by a health care professional in a hospital or clinic setting. Talk to your pediatrician regarding the use of this medicine in children. Special care may be needed. What if I miss a dose? It is important not to miss your dose. Call your doctor or health care professional if you are unable to keep an appointment. What may interact with this medicine? This medicine may interact with the following medications: -other iron products What should I watch for while using this medicine? Visit your doctor or healthcare professional regularly. Tell your doctor or healthcare professional if your symptoms do not start to get better or if they get worse. You may need blood work done while you are taking this medicine. You may need to follow a special diet. Talk to your doctor. Foods that contain iron include: whole grains/cereals, dried fruits, beans, or peas, leafy green vegetables, and organ meats (liver, kidney). What side effects may I notice from receiving this medicine? Side effects that you should report to your doctor or health care professional as soon as possible: -allergic reactions like skin rash, itching or hives, swelling of the  face, lips, or tongue -breathing problems -changes in blood pressure -feeling faint or lightheaded, falls -fever or chills -flushing, sweating, or hot feelings -swelling of the ankles or feet Side effects that usually do not require medical attention (report to your doctor or health care professional if they continue or are bothersome): -diarrhea -headache -nausea, vomiting -stomach pain Where should I keep my medicine? This drug is given in a hospital or clinic and will not be stored at home.  2017 Elsevier/Gold Standard (2016-01-06 12:41:49)  

## 2016-11-24 NOTE — Telephone Encounter (Signed)
Spoke to patient, he is scheduled for capsule endo on 11/28/16. Faxed prep instructions to him. Order placed for transjugular liver bx in [facility]. They will contact patient to schedule. They will also send a note to Dr. [name] to see if holding Xarelto 24 hours prior will be okay.

## 2016-11-30 ENCOUNTER — Telehealth: Payer: Self-pay | Admitting: Gastroenterology

## 2016-11-30 NOTE — Telephone Encounter (Signed)
Patient needed to reschedule endo capsule from 12/20 to 12/28.

## 2016-12-01 ENCOUNTER — Ambulatory Visit (HOSPITAL_COMMUNITY): Payer: PRIVATE HEALTH INSURANCE

## 2016-12-01 ENCOUNTER — Telehealth: Payer: Self-pay | Admitting: *Deleted

## 2016-12-01 ENCOUNTER — Other Ambulatory Visit (HOSPITAL_COMMUNITY): Payer: Medicare Other

## 2016-12-01 ENCOUNTER — Telehealth: Payer: Self-pay | Admitting: Gastroenterology

## 2016-12-01 NOTE — Telephone Encounter (Signed)
Bad breath would be an unusual symptom of anemia. If he has other symptoms for worsening anemia (shortness of breath, exertional dyspnea, worsening fatigue) we can recheck his CBC soon, but otherwise would plan on repeating in another 1-2 weeks after he received his IV iron. If he wants it done sooner for piece of mind however, that's fine. thanks

## 2016-12-01 NOTE — Telephone Encounter (Signed)
Spoke to patient, other than feeling tired, has not noticed anything different. Patient is eating well, sleeping 6-7 hours, normal color stools (not dark). Wife stated that his breath smells bad like it did the last time he became so anemic and wondered if this could be some correlation. Please advise.

## 2016-12-01 NOTE — Telephone Encounter (Signed)
Patient is calling because his wife states "Your breath smells like it did when you had internal bleeding". He wants to know if this is a possible side effect of his iron infusion last week.  Spoke with both Dr [name] and the pharmacist, Lattie Haw, and neither believe that this is caused by his feraheme infusion. Patient aware; he will follow up with his PCP.

## 2016-12-04 ENCOUNTER — Other Ambulatory Visit: Payer: Self-pay

## 2016-12-04 DIAGNOSIS — D508 Other iron deficiency anemias: Secondary | ICD-10-CM

## 2016-12-04 NOTE — Telephone Encounter (Signed)
Spoke to patient, let him know that Dr. Havery Moros thought that bad breath would not be the usual symptom of anemia. Discussed again what to look for with worsening anemia. I let him know that we could check a CBC this week at his convenience. Patient said that he can come get that done this Thursday, 12/21.

## 2016-12-07 ENCOUNTER — Other Ambulatory Visit (INDEPENDENT_AMBULATORY_CARE_PROVIDER_SITE_OTHER): Payer: PRIVATE HEALTH INSURANCE

## 2016-12-07 ENCOUNTER — Other Ambulatory Visit: Payer: Self-pay

## 2016-12-07 DIAGNOSIS — D508 Other iron deficiency anemias: Secondary | ICD-10-CM | POA: Diagnosis not present

## 2016-12-07 DIAGNOSIS — K7460 Unspecified cirrhosis of liver: Secondary | ICD-10-CM

## 2016-12-07 DIAGNOSIS — K7581 Nonalcoholic steatohepatitis (NASH): Principal | ICD-10-CM

## 2016-12-07 LAB — CBC WITH DIFFERENTIAL/PLATELET
BASOS PCT: 0.3 % (ref 0.0–3.0)
Basophils Absolute: 0 10*3/uL (ref 0.0–0.1)
EOS ABS: 0.1 10*3/uL (ref 0.0–0.7)
EOS PCT: 2.1 % (ref 0.0–5.0)
HEMATOCRIT: 35.5 % — AB (ref 39.0–52.0)
HEMOGLOBIN: 11.1 g/dL — AB (ref 13.0–17.0)
Lymphocytes Relative: 14.9 % (ref 12.0–46.0)
Lymphs Abs: 0.8 10*3/uL (ref 0.7–4.0)
MCHC: 31.4 g/dL (ref 30.0–36.0)
MCV: 68.4 fl — ABNORMAL LOW (ref 78.0–100.0)
MONO ABS: 0.7 10*3/uL (ref 0.1–1.0)
Monocytes Relative: 13.3 % — ABNORMAL HIGH (ref 3.0–12.0)
NEUTROS ABS: 3.7 10*3/uL (ref 1.4–7.7)
Neutrophils Relative %: 69.4 % (ref 43.0–77.0)
PLATELETS: 232 10*3/uL (ref 150.0–400.0)
RBC: 5.19 Mil/uL (ref 4.22–5.81)
RDW: 23.4 % — AB (ref 11.5–15.5)
WBC: 5.4 10*3/uL (ref 4.0–10.5)

## 2016-12-08 ENCOUNTER — Other Ambulatory Visit: Payer: Self-pay | Admitting: Student

## 2016-12-12 ENCOUNTER — Ambulatory Visit (HOSPITAL_COMMUNITY)
Admission: RE | Admit: 2016-12-12 | Discharge: 2016-12-12 | Disposition: A | Discharge status: Home / Self Care | Payer: PRIVATE HEALTH INSURANCE | Source: Ambulatory Visit | Attending: Gastroenterology | Admitting: Gastroenterology

## 2016-12-12 ENCOUNTER — Encounter (HOSPITAL_COMMUNITY): Payer: Self-pay

## 2016-12-12 ENCOUNTER — Other Ambulatory Visit: Payer: Self-pay | Admitting: Gastroenterology

## 2016-12-12 DIAGNOSIS — K760 Fatty (change of) liver, not elsewhere classified: Secondary | ICD-10-CM | POA: Insufficient documentation

## 2016-12-12 DIAGNOSIS — D45 Polycythemia vera: Secondary | ICD-10-CM | POA: Diagnosis not present

## 2016-12-12 DIAGNOSIS — Z7901 Long term (current) use of anticoagulants: Secondary | ICD-10-CM | POA: Diagnosis not present

## 2016-12-12 DIAGNOSIS — E785 Hyperlipidemia, unspecified: Secondary | ICD-10-CM | POA: Diagnosis not present

## 2016-12-12 DIAGNOSIS — K7581 Nonalcoholic steatohepatitis (NASH): Principal | ICD-10-CM

## 2016-12-12 DIAGNOSIS — Z96659 Presence of unspecified artificial knee joint: Secondary | ICD-10-CM | POA: Diagnosis not present

## 2016-12-12 DIAGNOSIS — E119 Type 2 diabetes mellitus without complications: Secondary | ICD-10-CM | POA: Diagnosis not present

## 2016-12-12 DIAGNOSIS — Z7984 Long term (current) use of oral hypoglycemic drugs: Secondary | ICD-10-CM | POA: Insufficient documentation

## 2016-12-12 DIAGNOSIS — E669 Obesity, unspecified: Secondary | ICD-10-CM | POA: Diagnosis not present

## 2016-12-12 DIAGNOSIS — K7460 Unspecified cirrhosis of liver: Secondary | ICD-10-CM | POA: Diagnosis present

## 2016-12-12 DIAGNOSIS — H3530 Unspecified macular degeneration: Secondary | ICD-10-CM | POA: Diagnosis not present

## 2016-12-12 DIAGNOSIS — I10 Essential (primary) hypertension: Secondary | ICD-10-CM | POA: Insufficient documentation

## 2016-12-12 DIAGNOSIS — D649 Anemia, unspecified: Secondary | ICD-10-CM | POA: Diagnosis not present

## 2016-12-12 DIAGNOSIS — G4730 Sleep apnea, unspecified: Secondary | ICD-10-CM | POA: Diagnosis not present

## 2016-12-12 DIAGNOSIS — K219 Gastro-esophageal reflux disease without esophagitis: Secondary | ICD-10-CM | POA: Diagnosis not present

## 2016-12-12 DIAGNOSIS — D509 Iron deficiency anemia, unspecified: Secondary | ICD-10-CM

## 2016-12-12 HISTORY — PX: IR GENERIC HISTORICAL: IMG1180011

## 2016-12-12 LAB — COMPREHENSIVE METABOLIC PANEL
ALBUMIN: 4.2 g/dL (ref 3.5–5.0)
ALK PHOS: 69 U/L (ref 38–126)
ALT: 21 U/L (ref 17–63)
AST: 30 U/L (ref 15–41)
Anion gap: 9 (ref 5–15)
BILIRUBIN TOTAL: 0.6 mg/dL (ref 0.3–1.2)
BUN: 17 mg/dL (ref 6–20)
CALCIUM: 10.2 mg/dL (ref 8.9–10.3)
CO2: 22 mmol/L (ref 22–32)
CREATININE: 1.08 mg/dL (ref 0.61–1.24)
Chloride: 106 mmol/L (ref 101–111)
GFR calc Af Amer: >60 mL/min (ref >60)
GFR calc non Af Amer: >60 mL/min (ref >60)
GLUCOSE: 128 mg/dL — AB (ref 65–99)
Potassium: 4.2 mmol/L (ref 3.5–5.1)
SODIUM: 137 mmol/L (ref 135–145)
TOTAL PROTEIN: 6.9 g/dL (ref 6.5–8.1)

## 2016-12-12 LAB — CBC
HCT: 37.3 % — ABNORMAL LOW (ref 39.0–52.0)
Hemoglobin: 11 g/dL — ABNORMAL LOW (ref 13.0–17.0)
MCH: 21.1 pg — AB (ref 26.0–34.0)
MCHC: 29.5 g/dL — AB (ref 30.0–36.0)
MCV: 71.6 fL — AB (ref 78.0–100.0)
PLATELETS: 269 10*3/uL (ref 150–400)
RBC: 5.21 MIL/uL (ref 4.22–5.81)
RDW: 23.7 % — AB (ref 11.5–15.5)
WBC: 5.4 10*3/uL (ref 4.0–10.5)

## 2016-12-12 LAB — PROTIME-INR
INR: 1.12
Prothrombin Time: 14.4 seconds (ref 11.4–15.2)

## 2016-12-12 LAB — GLUCOSE, CAPILLARY: Glucose-Capillary: 123 mg/dL — ABNORMAL HIGH (ref 65–99)

## 2016-12-12 LAB — APTT: aPTT: 27 seconds (ref 24–36)

## 2016-12-12 MED ORDER — MIDAZOLAM HCL 2 MG/2ML IJ SOLN
INTRAMUSCULAR | Status: AC | PRN
  Administered 2016-12-12 (×3): 1 mg via INTRAVENOUS

## 2016-12-12 MED ORDER — LIDOCAINE HCL 1 % IJ SOLN
INTRAMUSCULAR | Status: AC
  Filled 2016-12-12: qty 20

## 2016-12-12 MED ORDER — IOPAMIDOL (ISOVUE-300) INJECTION 61%
25.0000 mL | Freq: Once | INTRAVENOUS | Status: AC | PRN
  Administered 2016-12-12: 25 mL via INTRAVENOUS

## 2016-12-12 MED ORDER — IOPAMIDOL (ISOVUE-300) INJECTION 61%
INTRAVENOUS | Status: AC
Start: 2016-12-12 — End: 2016-12-12
  Administered 2016-12-12: 25 mL via INTRAVENOUS
  Filled 2016-12-12: qty 50

## 2016-12-12 MED ORDER — LIDOCAINE HCL 1 % IJ SOLN
INTRAMUSCULAR | Status: AC | PRN
  Administered 2016-12-12: 5 mL

## 2016-12-12 MED ORDER — FENTANYL CITRATE (PF) 100 MCG/2ML IJ SOLN
INTRAMUSCULAR | Status: AC | PRN
  Administered 2016-12-12 (×2): 50 ug via INTRAVENOUS
  Administered 2016-12-12 (×2): 25 ug via INTRAVENOUS

## 2016-12-12 MED ORDER — FENTANYL CITRATE (PF) 100 MCG/2ML IJ SOLN
INTRAMUSCULAR | Status: AC
  Filled 2016-12-12: qty 4

## 2016-12-12 MED ORDER — SODIUM CHLORIDE 0.9 % IV SOLN
INTRAVENOUS | Status: DC
  Administered 2016-12-12: 08:00:00 via INTRAVENOUS

## 2016-12-12 MED ORDER — MIDAZOLAM HCL 2 MG/2ML IJ SOLN
INTRAMUSCULAR | Status: AC
  Filled 2016-12-12: qty 4

## 2016-12-12 NOTE — Sedation Documentation (Signed)
Patient is resting comfortably. 

## 2016-12-12 NOTE — Discharge Instructions (Signed)
Liver Biopsy, Care After Introduction These instructions give you information on caring for yourself after your procedure. Your doctor may also give you more specific instructions. Call your doctor if you have any problems or questions after your procedure. Follow these instructions at home:  Rest at home for 1-2 days or as told by your doctor.  Have someone stay with you for at least 24 hours.  Do not do these things in the first 24 hours:  Drive.  Use machinery.  Take care of other people.  Sign legal documents.  Take a bath or shower.  There are many different ways to close and cover a cut (incision). For example, a cut can be closed with stitches, skin glue, or adhesive strips. Follow your doctor's instructions on:  Taking care of your cut.  Changing and removing your bandage (dressing).  Removing whatever was used to close your cut.  Do not drink alcohol in the first week.  Do not lift more than 5 pounds or play contact sports for the first 2 weeks.  Take medicines only as told by your doctor. For 1 week, do not take medicine that has aspirin in it or medicines like ibuprofen.  Get your test results. Contact a doctor if:  A cut bleeds and leaves more than just a small spot of blood.  A cut is red, puffs up (swells), or hurts more than before.  Fluid or something else comes from a cut.  A cut smells bad.  You have a fever or chills. Get help right away if:  You have swelling, bloating, or pain in your belly (abdomen).  You get dizzy or faint.  You have a rash.  You feel sick to your stomach (nauseous) or throw up (vomit).  You have trouble breathing, feel short of breath, or feel faint.  Your chest hurts.  You have problems talking or seeing.  You have trouble balancing or moving your arms or legs. This information is not intended to replace advice given to you by your health care provider. Make sure you discuss any questions you have with your  health care provider. Document Released: 09/12/2008 Document Revised: 05/11/2016 Document Reviewed: 01/30/2014  2017 Elsevier Moderate Conscious Sedation, Adult, Care After These instructions provide you with information about caring for yourself after your procedure. Your health care provider may also give you more specific instructions. Your treatment has been planned according to current medical practices, but problems sometimes occur. Call your health care provider if you have any problems or questions after your procedure. What can I expect after the procedure? After your procedure, it is common:  To feel sleepy for several hours.  To feel clumsy and have poor balance for several hours.  To have poor judgment for several hours.  To vomit if you eat too soon. Follow these instructions at home: For at least 24 hours after the procedure:   Do not:  Participate in activities where you could fall or become injured.  Drive.  Use heavy machinery.  Drink alcohol.  Take sleeping pills or medicines that cause drowsiness.  Make important decisions or sign legal documents.  Take care of children on your own.  Rest. Eating and drinking  Follow the diet recommended by your health care provider.  If you vomit:  Drink water, juice, or soup when you can drink without vomiting.  Make sure you have little or no nausea before eating solid foods. General instructions  Have a responsible adult stay with you until  you are awake and alert.  Take over-the-counter and prescription medicines only as told by your health care provider.  If you smoke, do not smoke without supervision.  Keep all follow-up visits as told by your health care provider. This is important. Contact a health care provider if:  You keep feeling nauseous or you keep vomiting.  You feel light-headed.  You develop a rash.  You have a fever. Get help right away if:  You have trouble breathing. This  information is not intended to replace advice given to you by your health care provider. Make sure you discuss any questions you have with your health care provider. Document Released: 09/24/2013 Document Revised: 05/08/2016 Document Reviewed: 03/25/2016 Elsevier Interactive Patient Education  2017 [employer].

## 2016-12-12 NOTE — Consult Note (Signed)
Chief Complaint: Patient was seen in consultation today for image guided random core liver biopsy  Referring Physician(s): [name]  Supervising Physician: [name]  Patient Status: James [name] OP  History of Present Illness: James [name]. is a 70 y.o. 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 He was started on anticoagulation with Xarelto. He also has anemia and prior GI bleed with gastritis noted on recent endoscopy. Recent imaging has also revealed changing concerning for underlying cirrhosis of liver. He presents today for transjugular liver biopsy for further evaluation and to assess portal vein pressures.  Past Medical History:  Diagnosis Date  . DM (diabetes mellitus) ([place])   . GERD (gastroesophageal reflux disease)   . Hiatal hernia   . History of shingles 04/03/2014  . HTN (hypertension)   . Hyperlipidemia   . Insomnia    resolved by using CPAP  . Iron deficiency anemia due to chronic blood loss 11/20/2016  . 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 Macular degeneration   . Nocturia more than twice per night 11/11/2014   For 6 month, 5 nocturias a night.   . Obesity   . 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 Rhinitis   . Situational depression   . Sleep apnea    uses CPAP every night  . Tubular adenoma 12/10/2015   6 cecum polyps    Past Surgical History:  Procedure Laterality Date  . CARDIAC CATHETERIZATION     greater 10 yrs ago, normal  . CARPAL TUNNEL RELEASE     bilateral  . COLONOSCOPY  06/2005   [facility] Dr Lajoyce Corners  . ESOPHAGOGASTRODUODENOSCOPY (EGD) WITH PROPOFOL N/A 09/28/2016   Procedure: ESOPHAGOGASTRODUODENOSCOPY (EGD) WITH PROPOFOL;  Surgeon: Gatha Mayer, MD;  Location: [facility];  Service: Endoscopy;  Laterality: N/A;  . ROTATOR CUFF REPAIR     right  . TENDON REPAIR     left arm  . TONSILLECTOMY    . TOTAL KNEE  ARTHROPLASTY Right 2010  . WISDOM TOOTH EXTRACTION      Allergies: Patient has no known allergies.  Medications: Prior to Admission medications   Medication Sig Start Date End Date Taking? Authorizing Provider  [facility] B-12 500 MCG SUBL Place 1 tablet under the tongue daily with breakfast.  09/17/14  Yes Historical Provider, MD  desmopressin (DDAVP) 0.2 MG tablet Take 0.4 mg by mouth at bedtime.  03/21/14  Yes Historical Provider, MD  glimepiride (AMARYL) 2 MG tablet Take 2-4 mg by mouth 2 (two) times daily. Take 77ms in the morning and 277m at night 03/21/14  Yes Historical Provider, MD  metFORMIN (GLUCOPHAGE-XR) 500 MG 24 hr tablet Take 500 mg by mouth 2 (two) times daily.    Yes Historical Provider, MD  pantoprazole (PROTONIX) 40 MG tablet Take 1 tablet (40 mg total) by mouth daily before breakfast. 10/27/16  Yes StManus GunningMD  tobramycin (TOBREX) 0.3 % ophthalmic solution Place 1 drop into the right eye every 4 (four) hours. X 5 days Patient taking differently: Place 1 drop into the right eye every 4 (four) hours. Use 4 times daily the 2 days before, the day of, and the 2 days after eye injections 04/08/16  Yes DaJanne NapoleonNP  Triamcinolone Acetonide (KENALOG IJ) Inject as directed once.   Yes Historical Provider, MD  valsartan-hydrochlorothiazide (DIOVAN-HCT) 320-25 MG per tablet Take 0.5 tablets by mouth daily with  breakfast.  07/20/13  Yes Historical Provider, MD  verapamil (CALAN-SR) 240 MG CR tablet Take 240 mg by mouth daily.   Yes Historical Provider, MD  iron polysaccharides (NIFEREX) 150 MG capsule Take 150 mg by mouth daily.    Historical Provider, MD  ONE TOUCH ULTRA TEST test strip by Other route as needed.  08/04/13   Historical Provider, MD  PRESCRIPTION MEDICATION every 8 (eight) weeks. Gets injections in right eye at dr's office every 8 weeks    Historical Provider, MD  rivaroxaban (XARELTO) 10 MG TABS tablet Hold medication for one week, please resume it after follow up  with GI. 10/25/16   [name], MD  rosuvastatin (CRESTOR) 20 MG tablet Take 20 mg by mouth at bedtime.     Historical Provider, MD  VIAGRA 100 MG tablet Take 100 mg by mouth daily as needed for erectile dysfunction.  10/26/14   Historical Provider, MD     Family History  Problem Relation Age of Onset  . Colon cancer    . Heart failure    . Heart attack    . Lung disease Father     history  . Diabetes Mother     history  . Stroke Mother     history  . Colon polyps Neg Hx   . Rectal cancer Neg Hx   . Stomach cancer Neg Hx   . Esophageal cancer Neg Hx     Social History   Social History  . Marital status: Married    Spouse name: N/A  . Number of children: 1  . Years of education: N/A   Social History Main Topics  . Smoking status: Never Smoker  . Smokeless tobacco: Never Used     Comment: never used tobacco  . Alcohol use 0.0 oz/week     Comment: rarely   . Drug use: No  . Sexual activity: Not Asked   Other Topics Concern  . None   Social History Narrative   Right handed.  Caffeine 3 cups daily,  Married 2 kids (one deceased).  College grad.  [employer].       Review of Systems denies fever, HA,CP, cough, abd/back pain,N/V or bleeding. He does have some dyspnea with exertion  Vital Signs: BP 139/76   Pulse 69   Temp 98.1 F (36.7 C) (Oral)   Resp 16   SpO2 98%   Physical Exam awake/alert; chest- CTA bilat; heart- RRR; abd- soft,+BS,NT; LE- no edema  Mallampati Score:     Imaging: No results found.  Labs:  CBC:  Recent Labs  10/26/16 0754 11/20/16 1338 12/07/16 0755 12/12/16 0750  WBC 5.1 7.4 5.4 5.4  HGB 9.0* 9.0* 11.1* 11.0*  HCT 28.9* 30.4* 35.5* 37.3*  PLT 275.0 264 232.0 269    COAGS:  Recent Labs  04/29/16 1920 09/28/16 0948 10/11/16 1204 12/12/16 0750  INR 1.24 1.19 1.1* 1.12  APTT 30  --   --  27    BMP:  Recent Labs  09/27/16 1844 09/27/16 1859 09/28/16 0405 10/25/16 1524  11/20/16 1338 12/12/16 0750  NA 136 138 137 134 137 137  K 4.0 3.9 3.9 4.1 4.3 4.2  CL 107 106 109 103  --  106  CO2 22  --  25 24 21* 22  GLUCOSE 92 88 186* 109* 91 128*  BUN 32* 32* 28* 19 17.2 17  CALCIUM 10.2  --  9.5 10.4* 10.1 10.2  CREATININE 1.13 1.20 1.02 1.09  1.2 1.08  GFRNONAA >60  --  >60 68  --  >60  GFRAA >60  --  >60 79  --  >60    LIVER FUNCTION TESTS:  Recent Labs  09/27/16 1844 10/25/16 1524 11/20/16 1338 12/12/16 0750  BILITOT 0.4 0.3 0.43 0.6  AST 24 24 19 30   ALT 23 25 19 21   ALKPHOS 87 102 109 69  PROT 7.1 6.5 7.0 6.9  ALBUMIN 4.0 4.1 3.7 4.2    TUMOR MARKERS:  Recent Labs  09/28/16 1003  AFPTM 1.4    Assessment and Plan: 70 y.o. 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 He was started on anticoagulation with Xarelto. He also has anemia and prior GI bleed with gastritis noted on recent endoscopy. Recent imaging has also revealed changing concerning for underlying cirrhosis of liver. He presents today for transjugular liver biopsy for further evaluation and to assess portal vein pressures.Risks and benefits discussed with the patient including, but not limited to bleeding, infection, damage to adjacent structures or low yield requiring additional tests. All of the patient's questions were answered, patient is agreeable to proceed. Consent signed and in chart.      Thank you for this interesting consult.  I greatly enjoyed meeting Ellis Mehaffey. and look forward to participating in their care.  A copy of this report was sent to the requesting provider on this date.  Electronically Signed: D. Rowe Robert 12/12/2016, 9:11 AM   I spent a total of 25 minutes in face to face in clinical consultation, greater than 50% of which was counseling/coordinating care for transjugular liver biopsy

## 2016-12-12 NOTE — Procedures (Signed)
TJLBx RIJV Core times four No comp/EBL

## 2016-12-12 NOTE — Sedation Documentation (Signed)
Hepatic vein wedge pressure:15 Vein Pressure:  9

## 2016-12-12 NOTE — Sedation Documentation (Signed)
C/o pain medicated

## 2016-12-12 NOTE — Sedation Documentation (Signed)
C/o of pain medicated

## 2016-12-13 ENCOUNTER — Ambulatory Visit (HOSPITAL_BASED_OUTPATIENT_CLINIC_OR_DEPARTMENT_OTHER): Payer: PRIVATE HEALTH INSURANCE

## 2016-12-13 VITALS — BP 137/76 | HR 75 | Temp 98.3°F | Resp 20

## 2016-12-13 DIAGNOSIS — D500 Iron deficiency anemia secondary to blood loss (chronic): Secondary | ICD-10-CM

## 2016-12-13 MED ORDER — SODIUM CHLORIDE 0.9 % IV SOLN
510.0000 mg | Freq: Once | INTRAVENOUS | Status: AC
  Administered 2016-12-13: 510 mg via INTRAVENOUS
  Filled 2016-12-13: qty 17

## 2016-12-13 MED ORDER — SODIUM CHLORIDE 0.9 % IV SOLN
Freq: Once | INTRAVENOUS | Status: AC
  Administered 2016-12-13: 16:00:00 via INTRAVENOUS

## 2016-12-13 NOTE — Progress Notes (Signed)
4:30 PM Band-aid removed to right lateral neck, site of biopsy. Area clean and dry, no swelling or reddness noted. Spot band-aid applied.

## 2016-12-13 NOTE — Patient Instructions (Signed)
Ferumoxytol injection What is this medicine? FERUMOXYTOL is an iron complex. Iron is used to make healthy red blood cells, which carry oxygen and nutrients throughout the body. This medicine is used to treat iron deficiency anemia in people with chronic kidney disease. COMMON BRAND NAME(S): Feraheme What should I tell my health care provider before I take this medicine? They need to know if you have any of these conditions: -anemia not caused by low iron levels -high levels of iron in the blood -magnetic resonance imaging (MRI) test scheduled -an unusual or allergic reaction to iron, other medicines, foods, dyes, or preservatives -pregnant or trying to get pregnant -breast-feeding How should I use this medicine? This medicine is for injection into a vein. It is given by a health care professional in a hospital or clinic setting. Talk to your pediatrician regarding the use of this medicine in children. Special care may be needed. What if I miss a dose? It is important not to miss your dose. Call your doctor or health care professional if you are unable to keep an appointment. What may interact with this medicine? This medicine may interact with the following medications: -other iron products What should I watch for while using this medicine? Visit your doctor or healthcare professional regularly. Tell your doctor or healthcare professional if your symptoms do not start to get better or if they get worse. You may need blood work done while you are taking this medicine. You may need to follow a special diet. Talk to your doctor. Foods that contain iron include: whole grains/cereals, dried fruits, beans, or peas, leafy green vegetables, and organ meats (liver, kidney). What side effects may I notice from receiving this medicine? Side effects that you should report to your doctor or health care professional as soon as possible: -allergic reactions like skin rash, itching or hives, swelling of the  face, lips, or tongue -breathing problems -changes in blood pressure -feeling faint or lightheaded, falls -fever or chills -flushing, sweating, or hot feelings -swelling of the ankles or feet Side effects that usually do not require medical attention (report to your doctor or health care professional if they continue or are bothersome): -diarrhea -headache -nausea, vomiting -stomach pain Where should I keep my medicine? This drug is given in a hospital or clinic and will not be stored at home.  2017 Elsevier/Gold Standard (2016-01-06 12:41:49)  

## 2016-12-14 ENCOUNTER — Encounter: Payer: Self-pay | Admitting: Gastroenterology

## 2016-12-14 ENCOUNTER — Ambulatory Visit (INDEPENDENT_AMBULATORY_CARE_PROVIDER_SITE_OTHER): Payer: PRIVATE HEALTH INSURANCE | Admitting: Gastroenterology

## 2016-12-14 DIAGNOSIS — D649 Anemia, unspecified: Secondary | ICD-10-CM | POA: Diagnosis not present

## 2016-12-14 NOTE — Progress Notes (Signed)
Patient here for capsule endoscopy. Tolerated procedure. Verbalizes understanding of written and verbal instructions. Capsule Id# [id number] Lot # [id number] Exp. 01-24-2018

## 2016-12-15 ENCOUNTER — Telehealth: Payer: Self-pay | Admitting: Gastroenterology

## 2016-12-15 NOTE — Telephone Encounter (Signed)
Spoke to patient, he is doing well following his capsule endoscopy yesterday. He retrieved the capsule and wanted to know what to do with it, told him to flush it or throw it away. We will let him know in a couple of weeks about the results.

## 2016-12-21 ENCOUNTER — Telehealth: Payer: Self-pay | Admitting: Gastroenterology

## 2016-12-21 NOTE — Telephone Encounter (Signed)
I called the patient related results of liver biopsy and capsules study with him as follows:  Portal pressures obtained with transjugular liver biopsy. He has a gradient of 6 which barely meets criteria for portal hypertension. Fortunately his liver biopsy does not show any evidence of cirrhosis, he has only some mild steatosis and is otherwise normal biopsy. I reassured patient in this light. 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   He has had a prior MRI of the liver in September for which radiology recommended repeat MRI in 3-6 months. I would like to repeat MRI of the liver next month for reassessment of indeterminate liver lesion.  Capsule endoscopy was performed and generally was a good prep with good views. 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 Otherwise no high-risk lesions. His hemoglobin has risen on IV iron, he said 2 more infusions since his last CBC and would plan on repeating CBC again in another few weeks. If his hemoglobin rises appropriately with IV iron and maintains while being on blood thinner I don't think we need to perform any further workup. If he fails to respond to IV iron, anemia worsens etc. then we will need to consider repeating upper endoscopy and colonoscopy.   All questions answered I will let Dr. [name] know results of capsule and plan.   Almyra Free can you please help coordinate MRI liver for February and repeat CBC in 2-3 weeks. Thanks

## 2016-12-21 NOTE — Telephone Encounter (Signed)
Have you had a chance to review his liver biopsy pathology yet? Thank you.

## 2016-12-22 ENCOUNTER — Other Ambulatory Visit: Payer: Self-pay

## 2016-12-22 DIAGNOSIS — K7460 Unspecified cirrhosis of liver: Secondary | ICD-10-CM

## 2016-12-22 DIAGNOSIS — K7581 Nonalcoholic steatohepatitis (NASH): Principal | ICD-10-CM

## 2016-12-22 NOTE — Telephone Encounter (Signed)
Patient aware of MRI of liver scheduled for 2/6 at 10:00, arrive 9:45 at [facility]. NPO 4 hours. Patient will also come get labs drawn on 1/23.

## 2016-12-29 ENCOUNTER — Encounter: Payer: Self-pay | Admitting: Gastroenterology

## 2016-12-29 NOTE — Telephone Encounter (Signed)
Error

## 2017-01-04 ENCOUNTER — Ambulatory Visit: Payer: PRIVATE HEALTH INSURANCE

## 2017-01-04 ENCOUNTER — Ambulatory Visit: Payer: PRIVATE HEALTH INSURANCE | Admitting: Hematology & Oncology

## 2017-01-04 ENCOUNTER — Other Ambulatory Visit: Payer: PRIVATE HEALTH INSURANCE

## 2017-01-08 ENCOUNTER — Other Ambulatory Visit (HOSPITAL_BASED_OUTPATIENT_CLINIC_OR_DEPARTMENT_OTHER): Payer: PRIVATE HEALTH INSURANCE

## 2017-01-08 ENCOUNTER — Ambulatory Visit: Payer: PRIVATE HEALTH INSURANCE

## 2017-01-08 ENCOUNTER — Ambulatory Visit (HOSPITAL_BASED_OUTPATIENT_CLINIC_OR_DEPARTMENT_OTHER): Payer: PRIVATE HEALTH INSURANCE | Admitting: Family

## 2017-01-08 VITALS — BP 123/77 | HR 82 | Temp 98.2°F | Resp 18 | Wt 239.0 lb

## 2017-01-08 DIAGNOSIS — D45 Polycythemia vera: Secondary | ICD-10-CM

## 2017-01-08 DIAGNOSIS — D751 Secondary polycythemia: Secondary | ICD-10-CM

## 2017-01-08 DIAGNOSIS — D500 Iron deficiency anemia secondary to blood loss (chronic): Secondary | ICD-10-CM

## 2017-01-08 DIAGNOSIS — K922 Gastrointestinal hemorrhage, unspecified: Secondary | ICD-10-CM

## 2017-01-08 LAB — CMP (CANCER CENTER ONLY)
ALBUMIN: 4.1 g/dL (ref 3.3–5.5)
ALK PHOS: 93 U/L — AB (ref 26–84)
ALT: 28 U/L (ref 10–47)
AST: 26 U/L (ref 11–38)
BUN: 15 mg/dL (ref 7–22)
CO2: 24 mEq/L (ref 18–33)
CREATININE: 1.2 mg/dL (ref 0.6–1.2)
Calcium: 10.5 mg/dL — ABNORMAL HIGH (ref 8.0–10.3)
Chloride: 107 mEq/L (ref 98–108)
Glucose, Bld: 89 mg/dL (ref 73–118)
POTASSIUM: 3.9 meq/L (ref 3.3–4.7)
Sodium: 139 mEq/L (ref 128–145)
TOTAL PROTEIN: 7.1 g/dL (ref 6.4–8.1)
Total Bilirubin: 0.7 mg/dl (ref 0.20–1.60)

## 2017-01-08 LAB — CBC WITH DIFFERENTIAL (CANCER CENTER ONLY)
BASO#: 0 10*3/uL (ref 0.0–0.2)
BASO%: 0.4 % (ref 0.0–2.0)
EOS ABS: 0.2 10*3/uL (ref 0.0–0.5)
EOS%: 2.8 % (ref 0.0–7.0)
HEMATOCRIT: 45.6 % (ref 38.7–49.9)
HEMOGLOBIN: 14.3 g/dL (ref 13.0–17.1)
LYMPH#: 1.5 10*3/uL (ref 0.9–3.3)
LYMPH%: 20.9 % (ref 14.0–48.0)
MCH: 23.8 pg — ABNORMAL LOW (ref 28.0–33.4)
MCHC: 31.4 g/dL — ABNORMAL LOW (ref 32.0–35.9)
MCV: 76 fL — AB (ref 82–98)
MONO#: 1 10*3/uL — AB (ref 0.1–0.9)
MONO%: 14.1 % — ABNORMAL HIGH (ref 0.0–13.0)
NEUT%: 61.8 % (ref 40.0–80.0)
NEUTROS ABS: 4.4 10*3/uL (ref 1.5–6.5)
PLATELETS: 231 10*3/uL (ref 145–400)
RBC: 6.02 10*6/uL — AB (ref 4.20–5.70)
RDW: 27.3 % — ABNORMAL HIGH (ref 11.1–15.7)
WBC: 7.1 10*3/uL (ref 4.0–10.0)

## 2017-01-08 NOTE — Progress Notes (Signed)
Hematology and Oncology Follow Up Visit  James [name] [id number] [birth year] 71 y.o. 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 IV iron as indicated - last received in Decemeber 2017 x 2     Interim History:  James [name] is here today for a follow-up. He is feeling a bit better. His energy has improved. He seems to have recuperated nicely from his GI bleed in October. His count have come up nicely and his energy is improving. His Hct today is 45.6 and since he is feeling so well and per his request we will hold off on phlebotomizing him.   He is doing well on low dose Xarelto at 10 mg PO daily. He states that he has not noticed much old blood in his stool as this is resolving. No bruising or petechiae.  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 This study was otherwise unremarkable. He is currently taking Protonix 40 mg PO daily.  He also had a liver biopsy in December which revealed hepatic steatosis with no evidence of cirrhosis.   He follows up with GI Dr. Havery Moros tomorrow.  No fever, chills, n/v, cough, dizziness, headache, SOB, chest pain, palpitations, abdominal pain or changes in bowel or bladder habits.  No swelling, tenderness, numbness or tingling in his extremities. No new aches or pains.  He has maintained a good appetite and is staying well hydrated. His weight is stable.   Medications:  Allergies as of 01/08/2017   No Known Allergies     Medication List       Accurate as of 01/08/17  4:47 PM. Always use your most recent med list.          [facility] B-12 500 MCG Subl Generic drug:  Cyanocobalamin Place 1 tablet under the tongue daily with breakfast.   desmopressin 0.2 MG tablet Commonly known as:  DDAVP Take 0.4 mg by mouth at bedtime.   glimepiride 2  MG tablet Commonly known as:  AMARYL Take 2-4 mg by mouth 2 (two) times daily. Take 54ms in the morning and 234m at night   KENALOG IJ Inject as directed once.   metFORMIN 500 MG 24 hr tablet Commonly known as:  GLUCOPHAGE-XR Take 500 mg by mouth 2 (two) times daily.   ONE TOUCH ULTRA TEST test strip Generic drug:  glucose blood by Other route as needed.   pantoprazole 40 MG tablet Commonly known as:  PROTONIX Take 1 tablet (40 mg total) by mouth daily before breakfast.   PRESCRIPTION MEDICATION every 8 (eight) weeks. Gets injections in right eye at dr's office every 8 weeks   rivaroxaban 10 MG Tabs tablet Commonly known as:  XARELTO Hold medication for one week, please resume it after follow up with GI.   rosuvastatin 20 MG tablet Commonly known as:  CRESTOR Take 20 mg by mouth at bedtime.   tobramycin 0.3 % ophthalmic solution Commonly known as:  TOBREX Place 1 drop into the right eye every 4 (four) hours. X 5 days   valsartan-hydrochlorothiazide 320-25 MG tablet Commonly known as:  DIOVAN-HCT Take 0.5 tablets by mouth daily with breakfast.   verapamil 240 MG CR tablet Commonly known as:  CALAN-SR Take 240 mg by mouth daily.   VIAGRA 100 MG tablet Generic drug:  sildenafil Take 100  mg by mouth daily as needed for erectile dysfunction.       Allergies: No Known Allergies  Past Medical History, Surgical history, Social history, and Family History were reviewed and updated.  Review of Systems: All other 10 point review of systems is negative.   Physical Exam:  weight is 239 lb (108.4 kg). His oral temperature is 98.2 F (36.8 C). His blood pressure is 123/77 and his pulse is 82. His respiration is 18 and oxygen saturation is 94%.   Wt Readings from Last 3 Encounters:  01/08/17 239 lb (108.4 kg)  11/20/16 242 lb (109.8 kg)  10/25/16 235 lb (106.6 kg)    Ocular: Sclerae unicteric, pupils equal, round and reactive to light Ear-nose-throat: Oropharynx  clear, dentition fair Lymphatic: No cervical supraclavicular or axillary adenopathy Lungs no rales or rhonchi, good excursion bilaterally Heart regular rate and rhythm, no murmur appreciated Abd soft, nontender, positive bowel sounds, no liver or spleen tip palpated on exam, no fluid wave MSK no focal spinal tenderness, no joint edema Neuro: non-focal, well-oriented, appropriate affect Breasts: Deferred  Lab Results  Component Value Date   WBC 7.1 01/08/2017   HGB 14.3 01/08/2017   HCT 45.6 01/08/2017   MCV 76 (L) 01/08/2017   PLT 231 01/08/2017   Lab Results  Component Value Date   FERRITIN 7 (L) 11/20/2016   IRON 19 (L) 11/20/2016   TIBC 464 (H) 11/20/2016   UIBC 445 (H) 11/20/2016   IRONPCTSAT 4 (L) 11/20/2016   Lab Results  Component Value Date   RETICCTPCT 1.7 08/30/2011   RBC 6.02 (H) 01/08/2017   RETICCTABS 94.0 08/30/2011   No results found for: Nils Pyle [facility] Lab Results  Component Value Date   IGGSERUM 828 10/11/2016   IGA 177 06/22/2016   No results found for: Odetta Pink, SPEI   Chemistry      Component Value Date/Time   NA 139 01/08/2017 1435   NA 137 11/20/2016 1338   K 3.9 01/08/2017 1435   K 4.3 11/20/2016 1338   CL 107 01/08/2017 1435   CO2 24 01/08/2017 1435   CO2 21 (L) 11/20/2016 1338   BUN 15 01/08/2017 1435   BUN 17.2 11/20/2016 1338   CREATININE 1.2 01/08/2017 1435   CREATININE 1.2 11/20/2016 1338      Component Value Date/Time   CALCIUM 10.5 (H) 01/08/2017 1435   CALCIUM 10.1 11/20/2016 1338   ALKPHOS 93 (H) 01/08/2017 1435   ALKPHOS 109 11/20/2016 1338   AST 26 01/08/2017 1435   AST 19 11/20/2016 1338   ALT 28 01/08/2017 1435   ALT 19 11/20/2016 1338   BILITOT 0.70 01/08/2017 1435   BILITOT 0.43 11/20/2016 1338     Impression and Plan: James [name] is 71 yo white male with polycythemia and recent history of anemia due to GI bleed. His anemia has  resolved nicely since receiving IV iron in December and his CBC counts today are stable. Iron studies are pending. I have forwarded available lab work to Dr. Havery Moros.  His Hct is 45.6 but we will not phlebotomize him since he is feeling so much better and per his request.  He will continue on low dose Xarelto at 10 mg PO daily finishing in May 2018.  We will plan to see him back in 6 weeks for labs and follow-up.  He will contact us with any questions or concerns. We can certainly see him sooner if need be.  Eliezer Bottom, NP 1/22/20184:47 PM

## 2017-01-09 ENCOUNTER — Telehealth: Payer: Self-pay

## 2017-01-09 ENCOUNTER — Other Ambulatory Visit (INDEPENDENT_AMBULATORY_CARE_PROVIDER_SITE_OTHER): Payer: PRIVATE HEALTH INSURANCE

## 2017-01-09 ENCOUNTER — Other Ambulatory Visit: Payer: Self-pay

## 2017-01-09 DIAGNOSIS — K7460 Unspecified cirrhosis of liver: Secondary | ICD-10-CM

## 2017-01-09 DIAGNOSIS — K7581 Nonalcoholic steatohepatitis (NASH): Secondary | ICD-10-CM

## 2017-01-09 DIAGNOSIS — Z862 Personal history of diseases of the blood and blood-forming organs and certain disorders involving the immune mechanism: Secondary | ICD-10-CM

## 2017-01-09 LAB — CBC WITH DIFFERENTIAL/PLATELET
BASOS ABS: 0 10*3/uL (ref 0.0–0.1)
Basophils Relative: 0.5 % (ref 0.0–3.0)
EOS ABS: 0.2 10*3/uL (ref 0.0–0.7)
EOS PCT: 3.2 % (ref 0.0–5.0)
HCT: 42.1 % (ref 39.0–52.0)
HEMOGLOBIN: 13.4 g/dL (ref 13.0–17.0)
Lymphocytes Relative: 16.8 % (ref 12.0–46.0)
Lymphs Abs: 0.8 10*3/uL (ref 0.7–4.0)
MCHC: 31.8 g/dL (ref 30.0–36.0)
MCV: 73.6 fl — ABNORMAL LOW (ref 78.0–100.0)
MONO ABS: 0.6 10*3/uL (ref 0.1–1.0)
Monocytes Relative: 11.4 % (ref 3.0–12.0)
NEUTROS PCT: 68.1 % (ref 43.0–77.0)
Neutro Abs: 3.4 10*3/uL (ref 1.4–7.7)
Platelets: 207 10*3/uL (ref 150.0–400.0)
RBC: 5.72 Mil/uL (ref 4.22–5.81)
RDW: 29.9 % — ABNORMAL HIGH (ref 11.5–15.5)
WBC: 5 10*3/uL (ref 4.0–10.5)

## 2017-01-09 LAB — FERRITIN: Ferritin: 32 ng/ml (ref 22–316)

## 2017-01-09 LAB — IRON AND TIBC
%SAT: 8 % — AB (ref 20–55)
Iron: 33 ug/dL — ABNORMAL LOW (ref 42–163)
TIBC: 394 ug/dL (ref 202–409)
UIBC: 360 ug/dL (ref 117–376)

## 2017-01-09 LAB — RETICULOCYTES: RETICULOCYTE COUNT: 0.9 % (ref 0.6–2.6)

## 2017-01-09 NOTE — Telephone Encounter (Signed)
-----   Message from [name], MD sent at 01/09/2017  9:47 AM EST ----- Caryl Pina can you please contact this patient and let him know that his Hgb is normal and his anemia has resolved which is excellent news. Recommend we repeat CBC in 1 month. Can you please let him know and let me know after you got ahold of him. thanks

## 2017-01-09 NOTE — Telephone Encounter (Signed)
Pt informed of results and understood to return in 1 month for CBC. Order put in for CBC.

## 2017-01-10 ENCOUNTER — Telehealth: Payer: Self-pay | Admitting: *Deleted

## 2017-01-10 NOTE — Telephone Encounter (Addendum)
Message left on personal voice mail   ----- Message from Eliezer Bottom, NP sent at 01/10/2017  3:58 PM EST ----- Regarding: iron  No iron needed at this time now that Hgb/Hct are up. Thank you!  Sarah  ----- Message ----- From: [name], MD Sent: 01/09/2017   1:58 PM To: Eliezer Bottom, NP    ----- Message ----- From: Interface, Lab In Three Zero One Sent: 01/08/2017   2:54 PM To: [name], MD

## 2017-01-18 ENCOUNTER — Ambulatory Visit (HOSPITAL_COMMUNITY)
Admission: RE | Admit: 2017-01-18 | Discharge: 2017-01-18 | Disposition: A | Discharge status: Home / Self Care | Payer: PRIVATE HEALTH INSURANCE | Source: Ambulatory Visit | Attending: Gastroenterology | Admitting: Gastroenterology

## 2017-01-18 DIAGNOSIS — K7460 Unspecified cirrhosis of liver: Secondary | ICD-10-CM | POA: Diagnosis present

## 2017-01-18 DIAGNOSIS — K7581 Nonalcoholic steatohepatitis (NASH): Secondary | ICD-10-CM | POA: Insufficient documentation

## 2017-01-18 MED ORDER — GADOBENATE DIMEGLUMINE 529 MG/ML IV SOLN: 20.0000 mL | Freq: Once | INTRAVENOUS | Status: DC | PRN

## 2017-01-18 MED ORDER — GADOXETATE DISODIUM 0.25 MMOL/ML IV SOLN
10.0000 mL | Freq: Once | INTRAVENOUS | Status: AC | PRN
  Administered 2017-01-18: 10 mL via INTRAVENOUS

## 2017-01-19 ENCOUNTER — Telehealth: Payer: Self-pay

## 2017-01-19 NOTE — Telephone Encounter (Signed)
Great - thanks

## 2017-01-19 NOTE — Telephone Encounter (Signed)
Pt informed of MR results. He requested that results be mailed to him and it has been mailed. A recall was placed for him to be contacted in April to schedule a 41mf/u in May

## 2017-01-19 NOTE — Telephone Encounter (Signed)
-----   Message from [name], MD sent at 01/19/2017  9:37 AM EST ----- Caryl Pina can you please let Mr. Newsham know that his MRI liver is stable, liver lesion is a benign cyst without interval change.  They comment on possible cirrhosis but he has already had a liver biopsy which did not show this.  I would like to see him in clinic for follow up in a few months if he does not already have an appointment, can you help him in scheduling. Thanks Can you please let me know. Thanks

## 2017-01-23 ENCOUNTER — Ambulatory Visit (HOSPITAL_COMMUNITY): Payer: PRIVATE HEALTH INSURANCE

## 2017-02-06 ENCOUNTER — Other Ambulatory Visit (INDEPENDENT_AMBULATORY_CARE_PROVIDER_SITE_OTHER): Payer: PRIVATE HEALTH INSURANCE

## 2017-02-06 DIAGNOSIS — Z862 Personal history of diseases of the blood and blood-forming organs and certain disorders involving the immune mechanism: Secondary | ICD-10-CM

## 2017-02-06 LAB — CBC WITH DIFFERENTIAL/PLATELET
BASOS ABS: 0 10*3/uL (ref 0.0–0.1)
Basophils Relative: 0.7 % (ref 0.0–3.0)
EOS ABS: 0.2 10*3/uL (ref 0.0–0.7)
Eosinophils Relative: 2.7 % (ref 0.0–5.0)
HCT: 44.1 % (ref 39.0–52.0)
Hemoglobin: 14.2 g/dL (ref 13.0–17.0)
LYMPHS ABS: 0.9 10*3/uL (ref 0.7–4.0)
Lymphocytes Relative: 16.3 % (ref 12.0–46.0)
MCHC: 32.1 g/dL (ref 30.0–36.0)
MCV: 75.4 fl — ABNORMAL LOW (ref 78.0–100.0)
MONO ABS: 0.8 10*3/uL (ref 0.1–1.0)
Monocytes Relative: 13.9 % — ABNORMAL HIGH (ref 3.0–12.0)
Neutro Abs: 3.8 10*3/uL (ref 1.4–7.7)
Neutrophils Relative %: 66.4 % (ref 43.0–77.0)
PLATELETS: 205 10*3/uL (ref 150.0–400.0)
RBC: 5.85 Mil/uL — ABNORMAL HIGH (ref 4.22–5.81)
RDW: 27.1 % — AB (ref 11.5–15.5)
WBC: 5.7 10*3/uL (ref 4.0–10.5)

## 2017-02-07 ENCOUNTER — Telehealth: Payer: Self-pay

## 2017-02-07 NOTE — Telephone Encounter (Signed)
Pt informed of results. Recall placed to contact pt to schedule a 62mclinic appt with [facility]

## 2017-02-07 NOTE — Telephone Encounter (Signed)
Left message for pt to return call.

## 2017-02-07 NOTE — Telephone Encounter (Signed)
-----   Message from [name], MD sent at 02/06/2017 12:42 PM EST ----- Caryl Pina can you please let this patient know that his Hgb is stable and he has no anemia which is excellent news.  I would like to see him in clinic for a follow up visit within the next 2 months if you can help coordinate. Thanks

## 2017-02-23 ENCOUNTER — Other Ambulatory Visit (HOSPITAL_BASED_OUTPATIENT_CLINIC_OR_DEPARTMENT_OTHER): Payer: PRIVATE HEALTH INSURANCE

## 2017-02-23 ENCOUNTER — Ambulatory Visit (HOSPITAL_BASED_OUTPATIENT_CLINIC_OR_DEPARTMENT_OTHER): Payer: PRIVATE HEALTH INSURANCE | Admitting: Hematology & Oncology

## 2017-02-23 VITALS — BP 117/81 | HR 85 | Temp 97.7°F | Wt 241.0 lb

## 2017-02-23 DIAGNOSIS — Z862 Personal history of diseases of the blood and blood-forming organs and certain disorders involving the immune mechanism: Secondary | ICD-10-CM

## 2017-02-23 DIAGNOSIS — D45 Polycythemia vera: Secondary | ICD-10-CM

## 2017-02-23 DIAGNOSIS — D500 Iron deficiency anemia secondary to blood loss (chronic): Secondary | ICD-10-CM

## 2017-02-23 DIAGNOSIS — K7460 Unspecified cirrhosis of liver: Secondary | ICD-10-CM | POA: Diagnosis not present

## 2017-02-23 LAB — CBC WITH DIFFERENTIAL (CANCER CENTER ONLY)
BASO#: 0 10*3/uL (ref 0.0–0.2)
BASO%: 0.3 % (ref 0.0–2.0)
EOS ABS: 0.1 10*3/uL (ref 0.0–0.5)
EOS%: 2.2 % (ref 0.0–7.0)
HCT: 47 % (ref 38.7–49.9)
HGB: 15.2 g/dL (ref 13.0–17.1)
LYMPH#: 1.1 10*3/uL (ref 0.9–3.3)
LYMPH%: 16.3 % (ref 14.0–48.0)
MCH: 24.3 pg — AB (ref 28.0–33.4)
MCHC: 32.3 g/dL (ref 32.0–35.9)
MCV: 75 fL — ABNORMAL LOW (ref 82–98)
MONO#: 0.8 10*3/uL (ref 0.1–0.9)
MONO%: 12.8 % (ref 0.0–13.0)
NEUT%: 68.4 % (ref 40.0–80.0)
NEUTROS ABS: 4.4 10*3/uL (ref 1.5–6.5)
PLATELETS: 214 10*3/uL (ref 145–400)
RBC: 6.26 10*6/uL — AB (ref 4.20–5.70)
RDW: 23.1 % — ABNORMAL HIGH (ref 11.1–15.7)
WBC: 6.5 10*3/uL (ref 4.0–10.0)

## 2017-02-23 LAB — IRON AND TIBC
%SAT: 11 % — ABNORMAL LOW (ref 20–55)
Iron: 46 ug/dL (ref 42–163)
TIBC: 430 ug/dL — ABNORMAL HIGH (ref 202–409)
UIBC: 384 ug/dL — ABNORMAL HIGH (ref 117–376)

## 2017-02-23 LAB — COMPREHENSIVE METABOLIC PANEL
ALT: 25 U/L (ref 0–55)
AST: 23 U/L (ref 5–34)
Albumin: 4 g/dL (ref 3.5–5.0)
Alkaline Phosphatase: 107 U/L (ref 40–150)
Anion Gap: 10 mEq/L (ref 3–11)
BILIRUBIN TOTAL: 0.62 mg/dL (ref 0.20–1.20)
BUN: 20.7 mg/dL (ref 7.0–26.0)
CO2: 23 meq/L (ref 22–29)
Calcium: 10.9 mg/dL — ABNORMAL HIGH (ref 8.4–10.4)
Chloride: 106 mEq/L (ref 98–109)
Creatinine: 1.3 mg/dL (ref 0.7–1.3)
EGFR: 54 mL/min/{1.73_m2} — AB (ref >90)
GLUCOSE: 153 mg/dL — AB (ref 70–140)
Potassium: 4.4 mEq/L (ref 3.5–5.1)
SODIUM: 139 meq/L (ref 136–145)
TOTAL PROTEIN: 7.2 g/dL (ref 6.4–8.3)

## 2017-02-23 LAB — FERRITIN: Ferritin: 10 ng/ml — ABNORMAL LOW (ref 22–316)

## 2017-02-23 NOTE — Progress Notes (Signed)
Hematology and Oncology Follow Up Visit  James [name] [id number] 1946/11/03 71 y.o. 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 IV iron as indicated - last received in Decemeber 2017 x 2     Interim History:  James [name] is here today for a follow-up. He is doing quite well. He had a MRI of the liver back in early February. Everything looked stable. There was a stable lesion in his liver which was felt to be a cyst. There is no obvious adenopathy seen. There is no thrombus in the portal vein.  He does have some NASH. This, I think he will always deal with.  He has got iron in the past because of profound anemia and history of bleeding. He last get iron back in January.  He's had no headache. He's had no nausea or vomiting. He's had no obvious bleeding. His been no change in bowel or bladder habits.  He still is not exercising like he needs to be. Hopefully, this will improve.  Overall, his performance status is ECOG 1.   Medications:  Allergies as of 02/23/2017   No Known Allergies     Medication List       Accurate as of 02/23/17  8:09 AM. Always use your most recent med list.          [employer] B-12 500 MCG Subl Generic drug:  Cyanocobalamin Place 1 tablet under the tongue daily with breakfast.   desmopressin 0.2 MG tablet Commonly known as:  DDAVP Take 0.4 mg by mouth at bedtime.   glimepiride 2 MG tablet Commonly known as:  AMARYL Take 2-4 mg by mouth 2 (two) times daily. Take 15ms in the morning and 29m at night   KENALOG IJ Inject as directed once.   metFORMIN 500 MG 24 hr tablet Commonly known as:  GLUCOPHAGE-XR Take 500 mg by mouth 2 (two) times daily.   ONE TOUCH ULTRA TEST test strip Generic drug:  glucose blood by Other route as needed.   pantoprazole 40 MG  tablet Commonly known as:  PROTONIX Take 1 tablet (40 mg total) by mouth daily before breakfast.   PRESCRIPTION MEDICATION every 8 (eight) weeks. Gets injections in right eye at dr's office every 8 weeks   rivaroxaban 10 MG Tabs tablet Commonly known as:  XARELTO Hold medication for one week, please resume it after follow up with GI.   rosuvastatin 20 MG tablet Commonly known as:  CRESTOR Take 20 mg by mouth at bedtime.   tobramycin 0.3 % ophthalmic solution Commonly known as:  TOBREX Place 1 drop into the right eye every 4 (four) hours. X 5 days   valsartan-hydrochlorothiazide 320-25 MG tablet Commonly known as:  DIOVAN-HCT Take 0.5 tablets by mouth daily with breakfast.   verapamil 240 MG CR tablet Commonly known as:  CALAN-SR Take 240 mg by mouth daily.   VIAGRA 100 MG tablet Generic drug:  sildenafil Take 100 mg by mouth daily as needed for erectile dysfunction.       Allergies: No Known Allergies  Past Medical History, Surgical history, Social history, and Family History were reviewed and updated.  Review of Systems: All other 10 point review of systems is negative.   Physical Exam:  weight is 241 lb (109.3 kg). His oral temperature is 97.7 F (36.5  C). His blood pressure is 117/81 and his pulse is 85.   Wt Readings from Last 3 Encounters:  02/23/17 241 lb (109.3 kg)  01/08/17 239 lb (108.4 kg)  11/20/16 242 lb (109.8 kg)    Ocular: Sclerae unicteric, pupils equal, round and reactive to light Ear-nose-throat: Oropharynx clear, dentition fair Lymphatic: No cervical supraclavicular or axillary adenopathy Lungs no rales or rhonchi, good excursion bilaterally Heart regular rate and rhythm, no murmur appreciated Abd soft, nontender, positive bowel sounds, no liver or spleen tip palpated on exam, no fluid wave MSK no focal spinal tenderness, no joint edema Neuro: non-focal, well-oriented, appropriate affect Breasts: Deferred  Lab Results  Component Value  Date   WBC 5.7 02/06/2017   HGB 14.2 02/06/2017   HCT 44.1 02/06/2017   MCV 75.4 (L) 02/06/2017   PLT 205.0 02/06/2017   Lab Results  Component Value Date   FERRITIN 32 01/08/2017   IRON 33 (L) 01/08/2017   TIBC 394 01/08/2017   UIBC 360 01/08/2017   IRONPCTSAT 8 (L) 01/08/2017   Lab Results  Component Value Date   RETICCTPCT 1.7 08/30/2011   RBC 5.85 (H) 02/06/2017   RETICCTABS 94.0 08/30/2011   No results found for: Nils Pyle [facility] Lab Results  Component Value Date   IGGSERUM 828 10/11/2016   IGA 177 06/22/2016   No results found for: Odetta Pink, SPEI   Chemistry      Component Value Date/Time   NA 139 01/08/2017 1435   NA 137 11/20/2016 1338   K 3.9 01/08/2017 1435   K 4.3 11/20/2016 1338   CL 107 01/08/2017 1435   CO2 24 01/08/2017 1435   CO2 21 (L) 11/20/2016 1338   BUN 15 01/08/2017 1435   BUN 17.2 11/20/2016 1338   CREATININE 1.2 01/08/2017 1435   CREATININE 1.2 11/20/2016 1338      Component Value Date/Time   CALCIUM 10.5 (H) 01/08/2017 1435   CALCIUM 10.1 11/20/2016 1338   ALKPHOS 93 (H) 01/08/2017 1435   ALKPHOS 109 11/20/2016 1338   AST 26 01/08/2017 1435   AST 19 11/20/2016 1338   ALT 28 01/08/2017 1435   ALT 19 11/20/2016 1338   BILITOT 0.70 01/08/2017 1435   BILITOT 0.43 11/20/2016 1338     Impression and Plan: James [name] is 71 yo white male with polycythemia and recent history of anemia due to GI bleed. His anemia has resolved nicely since receiving IV iron in December and his CBC counts today are stable. Iron studies are pending. I have forwarded available lab work to Dr. Havery Moros.  His Hct is 45.6 but we will not phlebotomize him since he is feeling so much better and per his request.  He will continue on low dose Xarelto at 10 mg PO daily finishing in May 2018.  We will plan to see him back in 6 weeks for labs and follow-up.  He will contact us with any  questions or concerns. We can certainly see him sooner if need be.   [name], MD 3/9/20188:09 AM   Hematology and Oncology Follow Up Visit  James [name] [id number] 1946/10/09 71 y.o. 02/23/2017   Principle Diagnosis:   Portal vein thrombus  Polycythemia vera  NASH with splenomegaly  Current Therapy:    Xarelto 10 mg by mouth daily-to be completed on 04/2017  Phlebotomy to maintain hematocrit less than 45%     Interim History:  Mr. Weldon is back for follow-up. He is  not feeling well. He just feels very tired. He is iron deficient from my point of view. He was put on some oral iron by his family doctor. This will not work as he is on a PPI.  He just has no energy. His stamina is quite low.  He is working. He did have a decent Thanksgiving although he just felt tired.  He has not noted any obvious melena.  He has had no fever. He's had no cough or shortness of breath. He's had no change in bowel or bladder habits.  Overall, his performance status is ECOG 1.  Medications:  Current Outpatient Prescriptions:  .  [employer] B-12 500 MCG SUBL, Place 1 tablet under the tongue daily with breakfast. , Disp: , Rfl: 12 .  desmopressin (DDAVP) 0.2 MG tablet, Take 0.4 mg by mouth at bedtime. , Disp: , Rfl:  .  glimepiride (AMARYL) 2 MG tablet, Take 2-4 mg by mouth 2 (two) times daily. Take 88ms in the morning and 215m at night, Disp: , Rfl:  .  metFORMIN (GLUCOPHAGE-XR) 500 MG 24 hr tablet, Take 500 mg by mouth 2 (two) times daily. , Disp: , Rfl:  .  ONE TOUCH ULTRA TEST test strip, by Other route as needed. , Disp: , Rfl:  .  pantoprazole (PROTONIX) 40 MG tablet, Take 1 tablet (40 mg total) by mouth daily before breakfast., Disp: 30 tablet, Rfl: 3 .  PRESCRIPTION MEDICATION, every 8 (eight) weeks. Gets injections in right eye at dr's office every 8 weeks, Disp: , Rfl:  .  rivaroxaban (XARELTO) 10 MG TABS tablet, Hold medication for one week, please resume it after follow up with  GI. (Patient taking differently: No sig reported), Disp: 30 tablet, Rfl: 8 .  rosuvastatin (CRESTOR) 20 MG tablet, Take 20 mg by mouth at bedtime. , Disp: , Rfl:  .  tobramycin (TOBREX) 0.3 % ophthalmic solution, Place 1 drop into the right eye every 4 (four) hours. X 5 days (Patient taking differently: Place 1 drop into the right eye every 4 (four) hours. Use 4 times daily the 2 days before, the day of, and the 2 days after eye injections), Disp: 5 mL, Rfl: 0 .  Triamcinolone Acetonide (KENALOG IJ), Inject as directed once., Disp: , Rfl:  .  valsartan-hydrochlorothiazide (DIOVAN-HCT) 320-25 MG per tablet, Take 0.5 tablets by mouth daily with breakfast. , Disp: , Rfl:  .  verapamil (CALAN-SR) 240 MG CR tablet, Take 240 mg by mouth daily., Disp: , Rfl:  .  VIAGRA 100 MG tablet, Take 100 mg by mouth daily as needed for erectile dysfunction. , Disp: , Rfl: 1  Allergies: No Known Allergies  Past Medical History, Surgical history, Social history, and Family History were reviewed and updated.  Review of Systems: As above  Physical Exam:  weight is 241 lb (109.3 kg). His oral temperature is 97.7 F (36.5 C). His blood pressure is 117/81 and his pulse is 85.   Wt Readings from Last 3 Encounters:  02/23/17 241 lb (109.3 kg)  01/08/17 239 lb (108.4 kg)  11/20/16 242 lb (109.8 kg)     Obese white male in no obvious distress. Head exam shows no ocular or oral lesions. There are no palpable cervical or supraclavicular lymph nodes. Lungs are clear. Cardiac exam regular rate and rhythm with no murmurs, rubs or bruits. Abdomen is soft. He has good bowel sounds. There is no guarding or rebound tenderness. He has no fluid wave. There is no palpable abdominal  mass. His spleen tip cannot be palpated. Extremities shows no clubbing, cyanosis or edema. Back exam shows no tenderness over the spine, ribs or hips. Skin exam shows no rashes, ecchymoses or petechia. Neurological exam shows no focal neurological  deficits.  Lab Results  Component Value Date   WBC 6.5 02/23/2017   HGB 15.2 02/23/2017   HCT 47.0 02/23/2017   MCV 75 (L) 02/23/2017   PLT 214 02/23/2017     Chemistry      Component Value Date/Time   NA 139 01/08/2017 1435   NA 137 11/20/2016 1338   K 3.9 01/08/2017 1435   K 4.3 11/20/2016 1338   CL 107 01/08/2017 1435   CO2 24 01/08/2017 1435   CO2 21 (L) 11/20/2016 1338   BUN 15 01/08/2017 1435   BUN 17.2 11/20/2016 1338   CREATININE 1.2 01/08/2017 1435   CREATININE 1.2 11/20/2016 1338      Component Value Date/Time   CALCIUM 10.5 (H) 01/08/2017 1435   CALCIUM 10.1 11/20/2016 1338   ALKPHOS 93 (H) 01/08/2017 1435   ALKPHOS 109 11/20/2016 1338   AST 26 01/08/2017 1435   AST 19 11/20/2016 1338   ALT 28 01/08/2017 1435   ALT 19 11/20/2016 1338   BILITOT 0.70 01/08/2017 1435   BILITOT 0.43 11/20/2016 1338         Impression and Plan: Mr. Canner is a 71 year old with polycythemia.   His hemoglobin actually is a little on the high side now. We will follow this closely. I really don't want to phlebotomize him right now. I think we can have a little bit of "flexibility" with respect to phlebotomize him.  I'll elect to see him back in 2 months. When we see him back, if his hematocrit is not a little bit lower, and we may have to phlebotomize him. We've been very aggressive with control of his blood counts.   He definitely does not need iron.   [name], MD 3/9/20188:25 AM

## 2017-02-24 LAB — RETICULOCYTES: Reticulocyte Count: 1.1 % (ref 0.6–2.6)

## 2017-03-01 ENCOUNTER — Encounter: Payer: Self-pay | Admitting: Gastroenterology

## 2017-03-18 ENCOUNTER — Other Ambulatory Visit: Payer: Self-pay | Admitting: Gastroenterology

## 2017-04-13 ENCOUNTER — Ambulatory Visit (INDEPENDENT_AMBULATORY_CARE_PROVIDER_SITE_OTHER): Payer: 59 | Admitting: Gastroenterology

## 2017-04-13 DIAGNOSIS — Z23 Encounter for immunization: Secondary | ICD-10-CM | POA: Diagnosis not present

## 2017-04-25 ENCOUNTER — Other Ambulatory Visit: Payer: Self-pay | Admitting: Orthopedic Surgery

## 2017-04-25 ENCOUNTER — Ambulatory Visit
Admission: RE | Admit: 2017-04-25 | Discharge: 2017-04-25 | Disposition: A | Discharge status: Home / Self Care | Payer: 59 | Source: Ambulatory Visit | Attending: Orthopedic Surgery | Admitting: Orthopedic Surgery

## 2017-04-25 DIAGNOSIS — S79911A Unspecified injury of right hip, initial encounter: Secondary | ICD-10-CM

## 2017-04-26 ENCOUNTER — Telehealth: Payer: Self-pay | Admitting: Gastroenterology

## 2017-04-26 NOTE — Telephone Encounter (Signed)
I looked at his CT, I don't see anything acute going on. I'll see him in clinic. Thanks

## 2017-04-26 NOTE — Telephone Encounter (Signed)
Called patient, he is doing fine GI wise, states the fall through him for a loop. Please take a look at recent CT scan. FYI patient does have a follow up appointment with you on 5/14.

## 2017-04-27 NOTE — Telephone Encounter (Signed)
Spoke to patient, reassured him nothing acute was going on and keep his appointment next week.

## 2017-04-30 ENCOUNTER — Encounter: Payer: Self-pay | Admitting: Gastroenterology

## 2017-04-30 ENCOUNTER — Other Ambulatory Visit (INDEPENDENT_AMBULATORY_CARE_PROVIDER_SITE_OTHER): Payer: 59

## 2017-04-30 ENCOUNTER — Ambulatory Visit (INDEPENDENT_AMBULATORY_CARE_PROVIDER_SITE_OTHER): Payer: 59 | Admitting: Gastroenterology

## 2017-04-30 VITALS — BP 110/60 | HR 88 | Ht 64.0 in | Wt 246.2 lb

## 2017-04-30 DIAGNOSIS — D509 Iron deficiency anemia, unspecified: Secondary | ICD-10-CM

## 2017-04-30 DIAGNOSIS — R935 Abnormal findings on diagnostic imaging of other abdominal regions, including retroperitoneum: Secondary | ICD-10-CM | POA: Diagnosis not present

## 2017-04-30 DIAGNOSIS — R932 Abnormal findings on diagnostic imaging of liver and biliary tract: Secondary | ICD-10-CM | POA: Diagnosis not present

## 2017-04-30 DIAGNOSIS — I81 Portal vein thrombosis: Secondary | ICD-10-CM | POA: Diagnosis not present

## 2017-04-30 LAB — CBC WITH DIFFERENTIAL/PLATELET
Basophils Absolute: 0 10*3/uL (ref 0.0–0.1)
Basophils Relative: 0.5 % (ref 0.0–3.0)
EOS ABS: 0.2 10*3/uL (ref 0.0–0.7)
Eosinophils Relative: 2 % (ref 0.0–5.0)
HCT: 41.8 % (ref 39.0–52.0)
HEMOGLOBIN: 13.1 g/dL (ref 13.0–17.0)
Lymphocytes Relative: 10.1 % — ABNORMAL LOW (ref 12.0–46.0)
Lymphs Abs: 0.8 10*3/uL (ref 0.7–4.0)
MCHC: 31.4 g/dL (ref 30.0–36.0)
MCV: 78.7 fl (ref 78.0–100.0)
MONO ABS: 0.7 10*3/uL (ref 0.1–1.0)
Monocytes Relative: 9.8 % (ref 3.0–12.0)
Neutro Abs: 5.9 10*3/uL (ref 1.4–7.7)
Neutrophils Relative %: 77.6 % — ABNORMAL HIGH (ref 43.0–77.0)
Platelets: 240 10*3/uL (ref 150.0–400.0)
RBC: 5.3 Mil/uL (ref 4.22–5.81)
RDW: 19.7 % — ABNORMAL HIGH (ref 11.5–15.5)
WBC: 7.6 10*3/uL (ref 4.0–10.5)

## 2017-04-30 LAB — FERRITIN: FERRITIN: 64.2 ng/mL (ref 22.0–322.0)

## 2017-04-30 LAB — IBC PANEL
IRON: 72 ug/dL (ref 42–165)
Saturation Ratios: 16.3 % — ABNORMAL LOW (ref 20.0–50.0)
TRANSFERRIN: 316 mg/dL (ref 212.0–360.0)

## 2017-04-30 NOTE — Progress Notes (Signed)
HPI :  71 year old male, complicated history as outlined below, here for a follow-up visit.  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 He was started on anticoagulation with Xarelto at that time. Incidentally noted at that time on imaging or changes concerning for underlying cirrhosis of the liver, however the spleen size was normal. Underwent ultrasound elastography showing F3/4 fibrosis and possible changes of cirrhosis. Along with this was an indeterminate liver lesion, for which the patient had a follow-up MRI of the liver which showed this appeared to be a benign cystic lesion. AFP was normal at the time. The patient also had some labs done to evaluate for chronic liver diseases which appeared negative to date other than mildly positive ANA.   In the interim since the patient was initially seen he developed dark stools and anemia all taking his Xarelto. He was admitted, had an EGD done on 09/28/16 - multiple erosions / gastritis, benign fundic gland polyps, no varices noted. Clo test negative for H pylori. Patient had been taking NSAIDs for bursitis at the time. It was thought perhaps he had gastritis related to NSAID use causing his symptoms he was started on proton pump inhibitor Xarelto was held his stools returned to normal color.  Since his last visit he had negative labs for chronic liver diseases negative including labs for hemochromatosis.  Transjugular liver biopsy with portal pressures obtained. He has a gradient of 6 which barely meets criteria for portal hypertension. Fortunately his liver biopsy does not show any evidence of cirrhosis, he has only some mild steatosis and is otherwise normal biopsy. I reassured patient in this light. 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 Follow up MRI liver on 01/18/17 showed  stable hepatic cyst, morphologic changes of mild cirrhosis  He had IV Iron infusion. Hgb normalized. Capsule endoscopy was performed and generally was a good prep with good views. 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 Otherwise no high-risk lesions. After IV iron infusion his ferritin level has remained around 10 as of March. Iron level of 46 with TIBC 430 and iron sat of 11%  He had a fall on Monday. Hurt his right leg / pelvis. CT scan 04/25/16  noting some "haziness" in the omental fat just below the umbilicus, slightly more prominent than the last CT, perhaps due to recent injury. Prior recent MRI abdomens did not show this.  He denies any obvious blood in the stools. He has dark brown stools.  He continues to be on Xarelto, supposed to be a 12 month course.  No abdominal pains he has. Outside of his recent fall he has been doing well.     Past Medical History:  Diagnosis Date  . DM (diabetes mellitus) ([place])   . GERD (gastroesophageal reflux disease)   . Hiatal hernia   . History of shingles 04/03/2014  . HTN (hypertension)   . Hyperlipidemia   . Insomnia    resolved by using CPAP  . Iron deficiency anemia due to chronic blood loss 11/20/2016  . Macular degeneration   . Nocturia more than twice per night 11/11/2014   For 6 month, 5 nocturias a night.   . Obesity   . 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 Portal vein thrombosis   .  Rhinitis   . Situational depression   . Sleep apnea    uses CPAP every night  . Tubular adenoma 12/10/2015   6 cecum polyps     Past Surgical History:  Procedure Laterality Date  . CARDIAC CATHETERIZATION     greater 10 yrs ago, normal  . CARPAL TUNNEL RELEASE     bilateral  . COLONOSCOPY  06/2005   [facility] Dr Lajoyce Corners  . ESOPHAGOGASTRODUODENOSCOPY (EGD) WITH PROPOFOL N/A 09/28/2016   Procedure: ESOPHAGOGASTRODUODENOSCOPY (EGD) WITH PROPOFOL;  Surgeon: Gatha Mayer, MD;   Location: [facility];  Service: Endoscopy;  Laterality: N/A;  . IR GENERIC HISTORICAL  12/12/2016   IR US GUIDE VASC ACCESS RIGHT 12/12/2016 [name], MD [facility]  . IR GENERIC HISTORICAL  12/12/2016   IR VENOGRAM HEPATIC W HEMODYNAMIC EVALUATION 12/12/2016 [name], MD [facility]  . IR GENERIC HISTORICAL  12/12/2016   IR TRANSCATHETER BX 12/12/2016 [name], MD [facility]  . ROTATOR CUFF REPAIR     right  . TENDON REPAIR     left arm  . TONSILLECTOMY    . TOTAL KNEE ARTHROPLASTY Right 2010  . WISDOM TOOTH EXTRACTION     Family History  Problem Relation Age of Onset  . Colon cancer Unknown   . Heart failure Unknown   . Heart attack Unknown   . Lung disease Father        history  . Diabetes Mother        history  . Stroke Mother        history  . Colon polyps Neg Hx   . Rectal cancer Neg Hx   . Stomach cancer Neg Hx   . Esophageal cancer Neg Hx    Social History  Substance Use Topics  . Smoking status: Never Smoker  . Smokeless tobacco: Never Used     Comment: never used tobacco  . Alcohol use 0.0 oz/week     Comment: rarely    Current Outpatient Prescriptions  Medication Sig Dispense Refill  . [employer] B-12 500 MCG SUBL Place 1 tablet under the tongue daily with breakfast.   12  . desmopressin (DDAVP) 0.2 MG tablet Take 0.4 mg by mouth at bedtime.     [name] glimepiride (AMARYL) 2 MG tablet Take 2-4 mg by mouth 2 (two) times daily. Take 81ms in the morning and 222m at night    . metFORMIN (GLUCOPHAGE-XR) 500 MG 24 hr tablet Take 500 mg by mouth 2 (two) times daily.     . ONE TOUCH ULTRA TEST test strip by Other route as needed.     . pantoprazole (PROTONIX) 40 MG tablet TAKE 1 TABLET (40 MG TOTAL) BY MOUTH DAILY BEFORE BREAKFAST. 30 tablet 3  . PRESCRIPTION MEDICATION every 8 (eight) weeks. Gets injections in right eye at dr's office every 8 weeks    . rivaroxaban (XARELTO) 10 MG TABS tablet Hold medication for one week, please resume it after follow up  with GI. (Patient taking differently: No sig reported) 30 tablet 8  . rosuvastatin (CRESTOR) 20 MG tablet Take 20 mg by mouth at bedtime.     . Marland Kitchenobramycin (TOBREX) 0.3 % ophthalmic solution Place 1 drop into the right eye every 4 (four) hours. X 5 days (Patient taking differently: Place 1 drop into the right eye every 4 (four) hours. Use 4 times daily the 2 days before, the day of, and the 2 days after eye injections) 5 mL 0  . Triamcinolone Acetonide (KENALOG  IJ) Inject as directed once.    . valsartan-hydrochlorothiazide (DIOVAN-HCT) 320-25 MG per tablet Take 0.5 tablets by mouth daily with breakfast.     . verapamil (CALAN-SR) 240 MG CR tablet Take 240 mg by mouth daily.    [name] VIAGRA 100 MG tablet Take 100 mg by mouth daily as needed for erectile dysfunction.   1   No current facility-administered medications for this visit.    No Known Allergies   Review of Systems: All systems reviewed and negative except where noted in HPI.    Ct Pelvis Wo Contrast  Result Date: 04/25/2017 CLINICAL DATA:  Fall 2 days ago with right-sided hip pain, initial encounter EXAM: CT PELVIS WITHOUT CONTRAST TECHNIQUE: Multidetector CT imaging of the pelvis was performed following the standard protocol without intravenous contrast. COMPARISON:  None. FINDINGS: Urinary Tract:  Bladder is partially distended. Bowel: Scattered diverticular change of the colon is noted. No diverticulitis is seen. The appendix is within normal limits. Vascular/Lymphatic: No pathologically enlarged lymph nodes. No significant vascular abnormality seen. Reproductive:  Prostate is within normal limits. Other: Some haziness is noted within the omental fat anteriorly just beneath the umbilicus. This is slightly more prominent than that seen on the prior exam Musculoskeletal: Degenerative changes of lumbar spine as well as the sacroiliac joints are seen. Some degenerative changes of the hip joints are noted right greater than left. No findings to  suggest acute fracture or dislocation are seen. IMPRESSION: No acute fracture is noted. Degenerative changes of the right hip joint are seen. Some haziness is noted in the omental fat just beneath the umbilicus. This slightly more prominent than on the prior CT of 2017. This may be related to the recent injury. Short-term follow-up in 3-6 months with noncontrast study is recommended to rule out any progression. These results will be called to the ordering clinician or representative by the Radiologist Assistant, and communication documented in the PACS or zVision Dashboard. Electronically Signed   By: Inez Catalina M.D.   On: 04/25/2017 11:57    CBC Latest Ref Rng & Units 04/30/2017 02/23/2017 02/06/2017  WBC 4.0 - 10.5 K/uL 7.6 6.5 5.7  Hemoglobin 13.0 - 17.0 g/dL 13.1 15.2 14.2  Hematocrit 39.0 - 52.0 % 41.8 47.0 44.1  Platelets 150.0 - 400.0 K/uL 240.0 214 205.0     Physical Exam: BP 110/60 (BP Location: Left Arm, Patient Position: Sitting, Cuff Size: Normal)   Pulse 88   Ht 5' 4"  (1.626 m)   Wt 246 lb 4 oz (111.7 kg)   BMI 42.27 kg/m  Constitutional: Pleasant,well-developed, male in no acute distress. HEENT: Normocephalic and atraumatic. Conjunctivae are normal. No scleral icterus. Neck supple.  Cardiovascular: Normal rate, regular rhythm.  Pulmonary/chest: Effort normal and breath sounds normal. No wheezing, rales or rhonchi. Abdominal: Soft, protuberant, nontender. There are no masses palpable. No hepatomegaly. Extremities: r leg ecchymosis and edema from traumatic injury Lymphadenopathy: No cervical adenopathy noted. Neurological: Alert and oriented to person place and time. Skin: Skin is warm and dry. No rashes noted. Psychiatric: Normal mood and affect. Behavior is normal.   ASSESSMENT AND PLAN: 71 year old male here for reassessment of the following issues:  Portal / splenic vein thrombosis - perhaps associated with polycythemia vera, now on anticoagulation for almost a year,  defer to Dr. [name] to determine duration of anticoagulation.  Abnormal liver imaging - there is a concern for cirrhosis based off imaging however normal spleen size and normal platelets, normal coags argued against this. He since  had a transjugular liver biopsy, very mild elevated portal pressure likely due to portal vein thrombosis, with liver biopsy not showing any evidence of cirrhosis, only mild steatosis. Based off of the liver biopsy he does not have cirrhosis of liver. We'll continue to monitor him over time. Of note, benign hepatic cyst on follow-up MRI, does not warrant further surveillance.   Iron deficiency anemia / prior GI bleed- in the setting of anticoagulation, perhaps due to gastritis and AVMs on capsule study. Relatively recent colonoscopy without any etiology to account for that. We'll repeat iron studies and CBC today, if they remain normal no further workup is needed, I'll periodically check CBC. He may stop the Xarelto in the near future, will await Dr. Antonieta Pert recommendation. Holding off on phlebotomy of polycythemia  Abnormal abdominal imaging - nonspecific haziness noted in the omental fat beneath the umbilicus on CT in the setting of a recent fall. The prior MRI in February did not show this. Perhaps this is reactive to his fall. May consider a repeat CT scan in 3-6 months per radiology to ensure no persistence or progression. he agreed  Bellefonte Cellar, MD [facility] Gastroenterology Pager [phone]

## 2017-04-30 NOTE — Patient Instructions (Addendum)
If you are age 72 or older, your body mass index should be between 23-30. Your Body mass index is 42.27 kg/m. If this is out of the aforementioned range listed, please consider follow up with your Primary Care Provider.  If you are age 44 or younger, your body mass index should be between 19-25. Your Body mass index is 42.27 kg/m. If this is out of the aformentioned range listed, please consider follow up with your Primary Care Provider.   Your physician has requested that you go to the basement for the following lab work before leaving today:  CBC, IBC, Ferritin  You will be contacted in 3-6 months to return for a CT scan.  Thank you.

## 2017-05-01 ENCOUNTER — Other Ambulatory Visit: Payer: Self-pay

## 2017-05-01 DIAGNOSIS — D508 Other iron deficiency anemias: Secondary | ICD-10-CM

## 2017-05-04 ENCOUNTER — Telehealth: Payer: Self-pay | Admitting: Gastroenterology

## 2017-05-04 NOTE — Telephone Encounter (Signed)
Called patient back, had to lvm to return call.

## 2017-05-04 NOTE — Telephone Encounter (Signed)
Patient of Dr. Doyne Keel, routed to DOD. Patient states that for last 2 day noticed external hemorrhoid, enlarging and becoming more uncomfortable. Denies any bleeding, patient is on Xarelto. He has not tried any OTC treatments. Please advise.

## 2017-05-04 NOTE — Telephone Encounter (Signed)
Patient given instructions, he understands to call office if not better on Monday to be seen.

## 2017-05-04 NOTE — Telephone Encounter (Signed)
Sitz baths three times daily for the next 3 days and witch hazel (Tucks) pads. SA back Monday if not better then and needs to be seen.

## 2017-05-10 ENCOUNTER — Ambulatory Visit (HOSPITAL_BASED_OUTPATIENT_CLINIC_OR_DEPARTMENT_OTHER): Payer: 59 | Admitting: Hematology & Oncology

## 2017-05-10 ENCOUNTER — Other Ambulatory Visit (HOSPITAL_BASED_OUTPATIENT_CLINIC_OR_DEPARTMENT_OTHER): Payer: 59

## 2017-05-10 ENCOUNTER — Telehealth: Payer: Self-pay | Admitting: *Deleted

## 2017-05-10 VITALS — BP 120/70 | HR 79 | Temp 98.5°F | Resp 20 | Wt 244.4 lb

## 2017-05-10 DIAGNOSIS — K7581 Nonalcoholic steatohepatitis (NASH): Secondary | ICD-10-CM | POA: Diagnosis not present

## 2017-05-10 DIAGNOSIS — D500 Iron deficiency anemia secondary to blood loss (chronic): Secondary | ICD-10-CM

## 2017-05-10 DIAGNOSIS — E119 Type 2 diabetes mellitus without complications: Secondary | ICD-10-CM

## 2017-05-10 DIAGNOSIS — I81 Portal vein thrombosis: Secondary | ICD-10-CM

## 2017-05-10 DIAGNOSIS — D45 Polycythemia vera: Secondary | ICD-10-CM

## 2017-05-10 DIAGNOSIS — K7460 Unspecified cirrhosis of liver: Secondary | ICD-10-CM

## 2017-05-10 LAB — CBC WITH DIFFERENTIAL (CANCER CENTER ONLY)
BASO#: 0 10*3/uL (ref 0.0–0.2)
BASO%: 0.2 % (ref 0.0–2.0)
EOS%: 1.7 % (ref 0.0–7.0)
Eosinophils Absolute: 0.1 10*3/uL (ref 0.0–0.5)
HCT: 44 % (ref 38.7–49.9)
HEMOGLOBIN: 14.2 g/dL (ref 13.0–17.1)
LYMPH#: 0.7 10*3/uL — ABNORMAL LOW (ref 0.9–3.3)
LYMPH%: 14 % (ref 14.0–48.0)
MCH: 25.9 pg — ABNORMAL LOW (ref 28.0–33.4)
MCHC: 32.3 g/dL (ref 32.0–35.9)
MCV: 80 fL — ABNORMAL LOW (ref 82–98)
MONO#: 0.6 10*3/uL (ref 0.1–0.9)
MONO%: 12 % (ref 0.0–13.0)
NEUT%: 72.1 % (ref 40.0–80.0)
NEUTROS ABS: 3.8 10*3/uL (ref 1.5–6.5)
PLATELETS: 261 10*3/uL (ref 145–400)
RBC: 5.48 10*6/uL (ref 4.20–5.70)
RDW: 21.1 % — ABNORMAL HIGH (ref 11.1–15.7)
WBC: 5.3 10*3/uL (ref 4.0–10.0)

## 2017-05-10 LAB — COMPREHENSIVE METABOLIC PANEL
ALT: 26 U/L (ref 0–55)
ANION GAP: 10 meq/L (ref 3–11)
AST: 25 U/L (ref 5–34)
Albumin: 3.9 g/dL (ref 3.5–5.0)
Alkaline Phosphatase: 97 U/L (ref 40–150)
BILIRUBIN TOTAL: 0.7 mg/dL (ref 0.20–1.20)
BUN: 21 mg/dL (ref 7.0–26.0)
CALCIUM: 10.3 mg/dL (ref 8.4–10.4)
CHLORIDE: 105 meq/L (ref 98–109)
CO2: 20 meq/L — AB (ref 22–29)
CREATININE: 1.3 mg/dL (ref 0.7–1.3)
EGFR: 54 mL/min/{1.73_m2} — ABNORMAL LOW (ref >90)
Glucose: 238 mg/dl — ABNORMAL HIGH (ref 70–140)
Potassium: 4.2 mEq/L (ref 3.5–5.1)
Sodium: 134 mEq/L — ABNORMAL LOW (ref 136–145)
TOTAL PROTEIN: 6.9 g/dL (ref 6.4–8.3)

## 2017-05-10 LAB — IRON AND TIBC
%SAT: 12 % — ABNORMAL LOW (ref 20–55)
Iron: 47 ug/dL (ref 42–163)
TIBC: 396 ug/dL (ref 202–409)
UIBC: 349 ug/dL (ref 117–376)

## 2017-05-10 LAB — FERRITIN: FERRITIN: 76 ng/mL (ref 22–316)

## 2017-05-10 NOTE — Telephone Encounter (Signed)
-----   Message from [name], MD sent at 05/10/2017 10:32 AM EDT ----- Call - No need for a phlebotomy!!!  Hct is 44%.  pete

## 2017-05-10 NOTE — Progress Notes (Signed)
Hematology and Oncology Follow Up Visit  James [name] [id number] 1946/09/17 72 y.o. 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 IV iron as indicated - last received in Decemeber 2017 x 2     Interim History:  James [name] here today for a follow-up. He is doing quite well. He feels well. He's had a problem with bleeding. He's had a recent fall. Thank you, he did not break anything. There is no excessive bleeding with the fall.  We will go ahead and get him off Xarelto now. He's been on low-dose Xarelto for one year. I think this is reasonable. His last MRI did not show any evidence of residual thrombus in the portal vein.  His last iron studies that were done back in March showed a ferritin of 10 with iron saturation of 11%. We did not give him any iron at that time.  His appetite is good. His weight is way too high. I think this will help contribute to his NASH. He does have diabetes.  Overall, his performance status is ECOG 1.   Medications:  Allergies as of 05/10/2017   No Known Allergies     Medication List       Accurate as of 05/10/17  8:35 AM. Always use your most recent med list.          CVS B-12 500 MCG Subl Generic drug:  Cyanocobalamin Place 1 tablet under the tongue daily with breakfast.   desmopressin 0.2 MG tablet Commonly known as:  DDAVP Take 0.4 mg by mouth at bedtime.   glimepiride 2 MG tablet Commonly known as:  AMARYL Take 2-4 mg by mouth 2 (two) times daily. Take 4735m in the morning and 35m28mat night   KENALOG IJ Inject as directed once.   metFORMIN 500 MG 24 hr tablet Commonly known as:  GLUCOPHAGE-XR Take 500 mg by mouth 2 (two) times daily.   ONE TOUCH ULTRA TEST test strip Generic drug:  glucose  blood by Other route as needed.   pantoprazole 40 MG tablet Commonly known as:  PROTONIX TAKE 1 TABLET (40 MG TOTAL) BY MOUTH DAILY BEFORE BREAKFAST.   PRESCRIPTION MEDICATION every 8 (eight) weeks. Gets injections in right eye at dr's office every 8 weeks   rivaroxaban 10 MG Tabs tablet Commonly known as:  XARELTO Hold medication for one week, please resume it after follow up with GI.   rosuvastatin 20 MG tablet Commonly known as:  CRESTOR Take 20 mg by mouth at bedtime.   tobramycin 0.3 % ophthalmic solution Commonly known as:  TOBREX Place 1 drop into the right eye every 4 (four) hours. X 5 days   valsartan-hydrochlorothiazide 320-25 MG tablet Commonly known as:  DIOVAN-HCT Take 0.5 tablets by mouth daily with breakfast.   verapamil 240 MG CR tablet Commonly known as:  CALAN-SR Take 240 mg by mouth daily.   VIAGRA 100 MG tablet Generic drug:  sildenafil Take 100 mg by mouth daily as needed for erectile dysfunction.       Allergies: No Known Allergies  Past Medical History, Surgical history, Social history, and Family History were reviewed and updated.  Review of Systems: All other 10 point review of systems is negative.   Physical Exam:  weight is 244 lb  6.4 oz (110.9 kg). His oral temperature is 98.5 F (36.9 C). His blood pressure is 120/70 and his pulse is 79. His respiration is 20 and oxygen saturation is 90%.   Wt Readings from Last 3 Encounters:  05/10/17 244 lb 6.4 oz (110.9 kg)  04/30/17 246 lb 4 oz (111.7 kg)  02/23/17 241 lb (109.3 kg)    Ocular: Sclerae unicteric, pupils equal, round and reactive to light Ear-nose-throat: Oropharynx clear, dentition fair Lymphatic: No cervical supraclavicular or axillary adenopathy Lungs no rales or rhonchi, good excursion bilaterally Heart regular rate and rhythm, no murmur appreciated Abd soft, nontender, positive bowel sounds, no liver or spleen tip palpated on exam, no fluid wave MSK no focal spinal  tenderness, no joint edema Neuro: non-focal, well-oriented, appropriate affect Breasts: Deferred  Lab Results  Component Value Date   WBC 7.6 04/30/2017   HGB 13.1 04/30/2017   HCT 41.8 04/30/2017   MCV 78.7 04/30/2017   PLT 240.0 04/30/2017   Lab Results  Component Value Date   FERRITIN 64.2 04/30/2017   IRON 72 04/30/2017   TIBC 430 (H) 02/23/2017   UIBC 384 (H) 02/23/2017   IRONPCTSAT 16.3 (L) 04/30/2017   Lab Results  Component Value Date   RETICCTPCT 1.7 08/30/2011   RBC 5.30 04/30/2017   RETICCTABS 94.0 08/30/2011   No results found for: Nils Pyle [facility] Lab Results  Component Value Date   IGGSERUM 828 10/11/2016   IGA 177 06/22/2016   No results found for: Odetta Pink, SPEI   Chemistry      Component Value Date/Time   NA 139 02/23/2017 0751   K 4.4 02/23/2017 0751   CL 107 01/08/2017 1435   CO2 23 02/23/2017 0751   BUN 20.7 02/23/2017 0751   CREATININE 1.3 02/23/2017 0751      Component Value Date/Time   CALCIUM 10.9 (H) 02/23/2017 0751   ALKPHOS 107 02/23/2017 0751   AST 23 02/23/2017 0751   ALT 25 02/23/2017 0751   BILITOT 0.62 02/23/2017 0751     Impression and Plan: James [name] is 71 yo white male with polycythemia and recent history of anemia due to GI bleed. His anemia has resolved nicely since receiving IV iron in December and his CBC counts today are stable. Iron studies are pending. I have forwarded available lab work to Dr. Havery Moros.  His Hct is 45.6 but we will not phlebotomize him since he is feeling so much better and per his request.  He will continue on low dose Xarelto at 10 mg PO daily finishing in May 2018.  We will plan to see him back in 6 weeks for labs and follow-up.  He will contact us with any questions or concerns. We can certainly see him sooner if need be.   [name], MD 5/24/20188:35 AM   Hematology and Oncology Follow Up Visit  James [name] [id number] [birth year] 71 y.o. 05/10/2017   Principle Diagnosis:   Portal vein thrombus  Polycythemia vera  NASH with splenomegaly  Current Therapy:    Xarelto 10 mg by mouth daily-to be completed on 04/2017  Phlebotomy to maintain hematocrit less than 45%     Interim History:  James [name] is back for follow-up. He is not feeling well. He just feels very tired. He is iron deficient from my point of view. He was put on some oral iron by his family doctor. This will not work as he is on a PPI.  He just has no energy. His stamina is quite low.  He is working. He did have a decent Thanksgiving although he just felt tired.  He has not noted any obvious melena.  He has had no fever. He's had no cough or shortness of breath. He's had no change in bowel or bladder habits.  Overall, his performance status is ECOG 1.  Medications:  Current Outpatient Prescriptions:  .  CVS B-12 500 MCG SUBL, Place 1 tablet under the tongue daily with breakfast. , Disp: , Rfl: 12 .  desmopressin (DDAVP) 0.2 MG tablet, Take 0.4 mg by mouth at bedtime. , Disp: , Rfl:  .  glimepiride (AMARYL) 2 MG tablet, Take 2-4 mg by mouth 2 (two) times daily. Take 30ms in the morning and 268m at night, Disp: , Rfl:  .  metFORMIN (GLUCOPHAGE-XR) 500 MG 24 hr tablet, Take 500 mg by mouth 2 (two) times daily. , Disp: , Rfl:  .  ONE TOUCH ULTRA TEST test strip, by Other route as needed. , Disp: , Rfl:  .  pantoprazole (PROTONIX) 40 MG tablet, TAKE 1 TABLET (40 MG TOTAL) BY MOUTH DAILY BEFORE BREAKFAST., Disp: 30 tablet, Rfl: 3 .  PRESCRIPTION MEDICATION, every 8 (eight) weeks. Gets injections in right eye at dr's office every 8 weeks, Disp: , Rfl:  .  rivaroxaban (XARELTO) 10 MG TABS tablet, Hold medication for one week, please resume it after follow up with GI. (Patient taking differently: No sig reported), Disp: 30 tablet, Rfl: 8 .  rosuvastatin (CRESTOR) 20 MG tablet, Take 20 mg by mouth at bedtime. , Disp: , Rfl:   .  tobramycin (TOBREX) 0.3 % ophthalmic solution, Place 1 drop into the right eye every 4 (four) hours. X 5 days (Patient taking differently: Place 1 drop into the right eye every 4 (four) hours. Use 4 times daily the 2 days before, the day of, and the 2 days after eye injections), Disp: 5 mL, Rfl: 0 .  Triamcinolone Acetonide (KENALOG IJ), Inject as directed once., Disp: , Rfl:  .  valsartan-hydrochlorothiazide (DIOVAN-HCT) 320-25 MG per tablet, Take 0.5 tablets by mouth daily with breakfast. , Disp: , Rfl:  .  verapamil (CALAN-SR) 240 MG CR tablet, Take 240 mg by mouth daily., Disp: , Rfl:  .  VIAGRA 100 MG tablet, Take 100 mg by mouth daily as needed for erectile dysfunction. , Disp: , Rfl: 1  Allergies: No Known Allergies  Past Medical History, Surgical history, Social history, and Family History were reviewed and updated.  Review of Systems: As above  Physical Exam:  weight is 244 lb 6.4 oz (110.9 kg). His oral temperature is 98.5 F (36.9 C). His blood pressure is 120/70 and his pulse is 79. His respiration is 20 and oxygen saturation is 90%.   Wt Readings from Last 3 Encounters:  05/10/17 244 lb 6.4 oz (110.9 kg)  04/30/17 246 lb 4 oz (111.7 kg)  02/23/17 241 lb (109.3 kg)     Obese white male in no obvious distress. Head exam shows no ocular or oral lesions. There are no palpable cervical or supraclavicular lymph nodes. Lungs are clear. Cardiac exam regular rate and rhythm with no murmurs, rubs or bruits. Abdomen is soft. He has good bowel sounds. There is no guarding or rebound tenderness. He has no fluid wave. There is no palpable abdominal mass. His spleen tip cannot be palpated. Extremities shows no clubbing, cyanosis or edema. Back exam shows no tenderness over the spine, ribs or  hips. Skin exam shows no rashes, ecchymoses or petechia. Neurological exam shows no focal neurological deficits.  Lab Results  Component Value Date   WBC 7.6 04/30/2017   HGB 13.1 04/30/2017    HCT 41.8 04/30/2017   MCV 78.7 04/30/2017   PLT 240.0 04/30/2017     Chemistry      Component Value Date/Time   NA 139 02/23/2017 0751   K 4.4 02/23/2017 0751   CL 107 01/08/2017 1435   CO2 23 02/23/2017 0751   BUN 20.7 02/23/2017 0751   CREATININE 1.3 02/23/2017 0751      Component Value Date/Time   CALCIUM 10.9 (H) 02/23/2017 0751   ALKPHOS 107 02/23/2017 0751   AST 23 02/23/2017 0751   ALT 25 02/23/2017 0751   BILITOT 0.62 02/23/2017 0751         Impression and Plan: Mr. Azer is a 71 year old with polycythemia.   Unfortunately, we will have to await the results from the labs. I would not think that he should have a problem.  I really think we can get him through the summertime.  We will get him off Xarelto now. He has been on Xarelto for a year. His last scans did not show any portal vein thrombus. He will take baby aspirin. He'll take 81 mg daily.    [name], MD 5/24/20188:35 AM

## 2017-05-22 ENCOUNTER — Telehealth: Payer: Self-pay | Admitting: Gastroenterology

## 2017-05-23 NOTE — Telephone Encounter (Signed)
Dr. Havery Moros, What do I need to do for this pt?

## 2017-05-23 NOTE — Telephone Encounter (Signed)
I think it is okay if he stops routine use, and take is as needed for reflux, etc. Please advise him to avoid NSAIDs (he has a history of gastritis) I would recommend he repeat CBC in a few months or so, or whatever Dr. [name] recommends

## 2017-05-24 ENCOUNTER — Other Ambulatory Visit: Payer: Self-pay

## 2017-05-24 DIAGNOSIS — D509 Iron deficiency anemia, unspecified: Secondary | ICD-10-CM

## 2017-05-24 DIAGNOSIS — Z8719 Personal history of other diseases of the digestive system: Secondary | ICD-10-CM

## 2017-05-24 NOTE — Telephone Encounter (Signed)
Pt states that he only spoke with Dr. [name] about him d/c Xarelto.  An order for CBC has been made and pt made aware to return in a few months to have drawn. He states that he only takes Tylenol and no NSAIDS. Pt has d/c Xarelto. He is taking the Protonix qd. He does not take the Omeprazole. Instructed to try taking the protonix qod and if he tolerates that he can continue to titrate down as long as symptoms do not return. Pt understood.

## 2017-06-11 ENCOUNTER — Other Ambulatory Visit: Payer: Self-pay | Admitting: *Deleted

## 2017-06-11 ENCOUNTER — Telehealth: Payer: Self-pay | Admitting: Gastroenterology

## 2017-06-11 ENCOUNTER — Telehealth: Payer: Self-pay | Admitting: *Deleted

## 2017-06-11 DIAGNOSIS — D500 Iron deficiency anemia secondary to blood loss (chronic): Secondary | ICD-10-CM

## 2017-06-11 DIAGNOSIS — D45 Polycythemia vera: Secondary | ICD-10-CM

## 2017-06-11 NOTE — Telephone Encounter (Signed)
Patient is c/o feeling sluggish. He'd like to have his labs drawn to check his levels. Appointment made with patient. Will follow up depending on results.

## 2017-06-11 NOTE — Telephone Encounter (Signed)
Spoke to patient, he is feeling "tired". He has a trip planned to go out [place] in 3 weeks and wanted to address this tiredness prior to going away. He also has a call into Dr. Antonieta Pert office. I let patient know that Dr. Havery Moros is not here today, advised him to talk to Dr. [name]. If patient does not hear back from them to please let us know and Dr. Havery Moros would be back in the office tomorrow.

## 2017-06-12 NOTE — Telephone Encounter (Signed)
Chart reviewed. Dr. [name] has ordered labs and they are pending. They will let him know results.

## 2017-06-22 ENCOUNTER — Other Ambulatory Visit (HOSPITAL_BASED_OUTPATIENT_CLINIC_OR_DEPARTMENT_OTHER): Payer: 59

## 2017-06-22 ENCOUNTER — Telehealth: Payer: Self-pay | Admitting: *Deleted

## 2017-06-22 DIAGNOSIS — D500 Iron deficiency anemia secondary to blood loss (chronic): Secondary | ICD-10-CM | POA: Diagnosis not present

## 2017-06-22 DIAGNOSIS — D45 Polycythemia vera: Secondary | ICD-10-CM

## 2017-06-22 LAB — CBC WITH DIFFERENTIAL (CANCER CENTER ONLY)
BASO#: 0 10*3/uL (ref 0.0–0.2)
BASO%: 0.2 % (ref 0.0–2.0)
EOS ABS: 0.1 10*3/uL (ref 0.0–0.5)
EOS%: 2.7 % (ref 0.0–7.0)
HCT: 45.7 % (ref 38.7–49.9)
HGB: 14.7 g/dL (ref 13.0–17.1)
LYMPH#: 0.8 10*3/uL — ABNORMAL LOW (ref 0.9–3.3)
LYMPH%: 16 % (ref 14.0–48.0)
MCH: 25.9 pg — AB (ref 28.0–33.4)
MCHC: 32.2 g/dL (ref 32.0–35.9)
MCV: 81 fL — ABNORMAL LOW (ref 82–98)
MONO#: 0.6 10*3/uL (ref 0.1–0.9)
MONO%: 12.3 % (ref 0.0–13.0)
NEUT#: 3.5 10*3/uL (ref 1.5–6.5)
NEUT%: 68.8 % (ref 40.0–80.0)
PLATELETS: 172 10*3/uL (ref 145–400)
RBC: 5.68 10*6/uL (ref 4.20–5.70)
RDW: 18.1 % — ABNORMAL HIGH (ref 11.1–15.7)
WBC: 5.1 10*3/uL (ref 4.0–10.0)

## 2017-06-22 LAB — CMP (CANCER CENTER ONLY)
ALT(SGPT): 29 U/L (ref 10–47)
AST: 28 U/L (ref 11–38)
Albumin: 3.6 g/dL (ref 3.3–5.5)
Alkaline Phosphatase: 88 U/L — ABNORMAL HIGH (ref 26–84)
BUN: 14 mg/dL (ref 7–22)
CHLORIDE: 107 meq/L (ref 98–108)
CO2: 23 meq/L (ref 18–33)
CREATININE: 1.4 mg/dL — AB (ref 0.6–1.2)
Calcium: 10.3 mg/dL (ref 8.0–10.3)
GLUCOSE: 209 mg/dL — AB (ref 73–118)
Potassium: 4.1 mEq/L (ref 3.3–4.7)
SODIUM: 135 meq/L (ref 128–145)
TOTAL PROTEIN: 6.6 g/dL (ref 6.4–8.1)
Total Bilirubin: 0.7 mg/dl (ref 0.20–1.60)

## 2017-06-22 LAB — IRON AND TIBC
%SAT: 10 % — AB (ref 20–55)
Iron: 39 ug/dL — ABNORMAL LOW (ref 42–163)
TIBC: 400 ug/dL (ref 202–409)
UIBC: 361 ug/dL (ref 117–376)

## 2017-06-22 LAB — FERRITIN: FERRITIN: 19 ng/mL — AB (ref 22–316)

## 2017-06-22 NOTE — Telephone Encounter (Signed)
Dr [name] reviewed lab results. Iron levels are low, however his hgb and hct are upper limits normal therefor patient is not to receive iron.  Patient is aware of results.

## 2017-06-23 LAB — RETICULOCYTES: Reticulocyte Count: 1.2 % (ref 0.6–2.6)

## 2017-07-20 ENCOUNTER — Other Ambulatory Visit: Payer: Self-pay | Admitting: Internal Medicine

## 2017-07-20 DIAGNOSIS — R269 Unspecified abnormalities of gait and mobility: Secondary | ICD-10-CM

## 2017-07-20 DIAGNOSIS — R4586 Emotional lability: Secondary | ICD-10-CM

## 2017-07-21 ENCOUNTER — Other Ambulatory Visit: Payer: Self-pay | Admitting: Gastroenterology

## 2017-07-22 ENCOUNTER — Ambulatory Visit
Admission: RE | Admit: 2017-07-22 | Discharge: 2017-07-22 | Disposition: A | Discharge status: Home / Self Care | Payer: 59 | Source: Ambulatory Visit | Attending: Internal Medicine | Admitting: Internal Medicine

## 2017-07-22 DIAGNOSIS — R269 Unspecified abnormalities of gait and mobility: Secondary | ICD-10-CM

## 2017-07-22 DIAGNOSIS — R4586 Emotional lability: Secondary | ICD-10-CM

## 2017-07-23 ENCOUNTER — Other Ambulatory Visit: Payer: Self-pay | Admitting: Internal Medicine

## 2017-07-23 DIAGNOSIS — R4586 Emotional lability: Secondary | ICD-10-CM

## 2017-07-23 DIAGNOSIS — R269 Unspecified abnormalities of gait and mobility: Secondary | ICD-10-CM

## 2017-07-25 ENCOUNTER — Telehealth: Payer: Self-pay | Admitting: Gastroenterology

## 2017-07-25 ENCOUNTER — Other Ambulatory Visit: Payer: Self-pay

## 2017-07-25 DIAGNOSIS — D508 Other iron deficiency anemias: Secondary | ICD-10-CM

## 2017-07-25 DIAGNOSIS — R933 Abnormal findings on diagnostic imaging of other parts of digestive tract: Secondary | ICD-10-CM

## 2017-07-25 NOTE — Telephone Encounter (Signed)
Routed to Dr. Armbruster. 

## 2017-07-25 NOTE — Telephone Encounter (Signed)
Called patient. He reports having some down with a virus during recent vacation, has been recovering over the past 2 weeks. States he feels an iron taste in his mouth, but is not having any bleeding symptoms. He reports he has brown stools, nothing black like he had before, although his energy has been low and wanting another CBC. It was last done a month ago and stable although his iron levels remained on the lower side. He is taking his protonix and is off Xarelto.  I don't think he is having any active bleeding right now based on his history today, but will send a CBC to ensure stable.  Otherwise he is asking about a follow up CT scan. His last scan in May showed some nonspecific haziness in the omentum and the radiologist had recommended an interval scan in 3-6 months. He wants to proceed with it now.   Almyra Free, can you order the patient a CBC and CT scan of the abdomen / pelvis with contrast. I notified him I will be out of the office the next 2 days. If he gets the CBC done during that time he will call the clinic to get results. Thanks

## 2017-07-26 ENCOUNTER — Telehealth: Payer: Self-pay | Admitting: *Deleted

## 2017-07-26 ENCOUNTER — Other Ambulatory Visit (INDEPENDENT_AMBULATORY_CARE_PROVIDER_SITE_OTHER): Payer: 59

## 2017-07-26 ENCOUNTER — Other Ambulatory Visit: Payer: Self-pay

## 2017-07-26 DIAGNOSIS — D508 Other iron deficiency anemias: Secondary | ICD-10-CM | POA: Diagnosis not present

## 2017-07-26 LAB — CBC WITH DIFFERENTIAL/PLATELET
Basophils Absolute: 0.1 10*3/uL (ref 0.0–0.1)
Basophils Relative: 1.1 % (ref 0.0–3.0)
EOS PCT: 2.4 % (ref 0.0–5.0)
Eosinophils Absolute: 0.2 10*3/uL (ref 0.0–0.7)
HEMATOCRIT: 44.8 % (ref 39.0–52.0)
HEMOGLOBIN: 14.2 g/dL (ref 13.0–17.0)
LYMPHS PCT: 12.2 % (ref 12.0–46.0)
Lymphs Abs: 1.1 10*3/uL (ref 0.7–4.0)
MCHC: 31.7 g/dL (ref 30.0–36.0)
MCV: 80.5 fl (ref 78.0–100.0)
MONOS PCT: 13.2 % — AB (ref 3.0–12.0)
Monocytes Absolute: 1.1 10*3/uL — ABNORMAL HIGH (ref 0.1–1.0)
Neutro Abs: 6.2 10*3/uL (ref 1.4–7.7)
Neutrophils Relative %: 71.1 % (ref 43.0–77.0)
Platelets: 526 10*3/uL — ABNORMAL HIGH (ref 150.0–400.0)
RBC: 5.57 Mil/uL (ref 4.22–5.81)
RDW: 17.9 % — ABNORMAL HIGH (ref 11.5–15.5)
WBC: 8.7 10*3/uL (ref 4.0–10.5)

## 2017-07-26 MED ORDER — RIVAROXABAN 10 MG PO TABS: 10.0000 mg | ORAL_TABLET | Freq: Every day | ORAL | 11 refills | Status: DC

## 2017-07-26 MED ORDER — ASPIRIN 81 MG PO TABS: 81.0000 mg | ORAL_TABLET | Freq: Every day | ORAL | Status: DC

## 2017-07-26 NOTE — Telephone Encounter (Signed)
Patient scheduled for CT, ordered CBC.

## 2017-07-26 NOTE — Telephone Encounter (Signed)
Patient's testosterone level is low and his PCP would like to supplement it. Patient states that in the past, due to his clotting, he advised patient to come off the testosterone he was on at that time.  Patient wants to know if he can now restart the testosterone.   Reviewed with Dr [name]. Chart was reviewed and patient is on Xarelto. Dr [name] is okay with patient being prescribed testosterone as long as he stays on his anticoagulant.   When I called the patient he stated he had been off Xarelto since May 2018 and was now on Aspirin 56m daily.   Reviewed again with Dr EMarin Olp He states that if patient wants to be on testosterone, he needs to restart the Xarelto 14mdaily and stop the ASA.  Patient does want to start testosterone to help with physical symptoms. He will restart Xarelto and stop ASA.   New prescription called in. Pharmacy confirmed with patient.

## 2017-07-27 NOTE — Telephone Encounter (Signed)
Left message for patient that his lab work is stable, if he has questions he can contact our office.

## 2017-07-27 NOTE — Telephone Encounter (Signed)
Hgb stable, platelet elevated, non specific.

## 2017-07-27 NOTE — Telephone Encounter (Signed)
Dr. Havery Moros patient, routed to DOD: Dr. Silverio Decamp. Can you please look at patient's recent CBC, thank you.

## 2017-08-01 ENCOUNTER — Ambulatory Visit
Admission: RE | Admit: 2017-08-01 | Discharge: 2017-08-01 | Disposition: A | Discharge status: Home / Self Care | Payer: 59 | Source: Ambulatory Visit | Attending: Gastroenterology | Admitting: Gastroenterology

## 2017-08-01 DIAGNOSIS — R933 Abnormal findings on diagnostic imaging of other parts of digestive tract: Secondary | ICD-10-CM

## 2017-08-01 MED ORDER — IOPAMIDOL (ISOVUE-300) INJECTION 61%: 125.0000 mL | Freq: Once | INTRAVENOUS | Status: DC | PRN

## 2017-08-02 ENCOUNTER — Telehealth: Payer: Self-pay | Admitting: Gastroenterology

## 2017-08-03 NOTE — Telephone Encounter (Signed)
Routed to Dr. Armbruster. 

## 2017-08-03 NOTE — Telephone Encounter (Signed)
Gallbladder is normal, no problems. He has a stable cyst in his liver otherwise, not causing any issues.

## 2017-08-03 NOTE — Telephone Encounter (Signed)
Patient advised of results.

## 2017-08-07 ENCOUNTER — Telehealth: Payer: Self-pay | Admitting: Gastroenterology

## 2017-08-07 NOTE — Telephone Encounter (Signed)
Called Dr. [name] - details in result note of CT

## 2017-08-15 ENCOUNTER — Other Ambulatory Visit: Payer: Self-pay

## 2017-08-15 ENCOUNTER — Other Ambulatory Visit (INDEPENDENT_AMBULATORY_CARE_PROVIDER_SITE_OTHER): Payer: 59

## 2017-08-15 ENCOUNTER — Telehealth: Payer: Self-pay | Admitting: Gastroenterology

## 2017-08-15 DIAGNOSIS — R1031 Right lower quadrant pain: Secondary | ICD-10-CM | POA: Diagnosis not present

## 2017-08-15 LAB — COMPREHENSIVE METABOLIC PANEL
ALK PHOS: 89 U/L (ref 39–117)
ALT: 18 U/L (ref 0–53)
AST: 17 U/L (ref 0–37)
Albumin: 4 g/dL (ref 3.5–5.2)
BUN: 13 mg/dL (ref 6–23)
CO2: 25 meq/L (ref 19–32)
CREATININE: 1.06 mg/dL (ref 0.40–1.50)
Calcium: 10.3 mg/dL (ref 8.4–10.5)
Chloride: 103 mEq/L (ref 96–112)
GFR: 72.93 mL/min (ref >60.00)
GLUCOSE: 178 mg/dL — AB (ref 70–99)
Potassium: 3.7 mEq/L (ref 3.5–5.1)
SODIUM: 135 meq/L (ref 135–145)
Total Bilirubin: 0.5 mg/dL (ref 0.2–1.2)
Total Protein: 6.6 g/dL (ref 6.0–8.3)

## 2017-08-15 LAB — CBC WITH DIFFERENTIAL/PLATELET
BASOS ABS: 0.1 10*3/uL (ref 0.0–0.1)
Basophils Relative: 0.7 % (ref 0.0–3.0)
EOS ABS: 0.2 10*3/uL (ref 0.0–0.7)
Eosinophils Relative: 2.2 % (ref 0.0–5.0)
HCT: 45.2 % (ref 39.0–52.0)
Hemoglobin: 14.4 g/dL (ref 13.0–17.0)
LYMPHS ABS: 1.3 10*3/uL (ref 0.7–4.0)
Lymphocytes Relative: 14 % (ref 12.0–46.0)
MCHC: 31.8 g/dL (ref 30.0–36.0)
MCV: 80 fl (ref 78.0–100.0)
Monocytes Absolute: 1.5 10*3/uL — ABNORMAL HIGH (ref 0.1–1.0)
Monocytes Relative: 16.5 % — ABNORMAL HIGH (ref 3.0–12.0)
NEUTROS ABS: 6 10*3/uL (ref 1.4–7.7)
NEUTROS PCT: 66.6 % (ref 43.0–77.0)
PLATELETS: 367 10*3/uL (ref 150.0–400.0)
RBC: 5.65 Mil/uL (ref 4.22–5.81)
RDW: 18 % — ABNORMAL HIGH (ref 11.5–15.5)
WBC: 9 10*3/uL (ref 4.0–10.5)

## 2017-08-15 LAB — LIPASE: Lipase: 12 U/L (ref 11.0–59.0)

## 2017-08-15 NOTE — Telephone Encounter (Signed)
Routed to Dr. Havery Moros. Patient has appointment with Dr. Jonette Eva on 08/23/17.

## 2017-08-15 NOTE — Telephone Encounter (Signed)
Not sure what is causing his discomfort. We can do basic labs to ensure stable - CBC, CMET, and lipase. He should be taking his Xarelto. Do any of the PAs have an opening in the next day or so to see him? I have no clinic today or tomorrow. Thanks

## 2017-08-15 NOTE — Telephone Encounter (Signed)
Spoke to patient labs ordered and appointment made with APP for this week. Patient is still taking Xarelto.

## 2017-08-16 ENCOUNTER — Ambulatory Visit (INDEPENDENT_AMBULATORY_CARE_PROVIDER_SITE_OTHER): Payer: 59 | Admitting: Physician Assistant

## 2017-08-16 ENCOUNTER — Encounter: Payer: Self-pay | Admitting: Physician Assistant

## 2017-08-16 VITALS — BP 110/70 | HR 74 | Ht 64.0 in | Wt 234.0 lb

## 2017-08-16 DIAGNOSIS — R109 Unspecified abdominal pain: Secondary | ICD-10-CM

## 2017-08-16 NOTE — Progress Notes (Signed)
Chief Complaint: RLQ abdominal pain  HPI:  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   Please recall patient follows with Dr. Havery Moros and was last seen 04/30/17. Please see that note for further details. At that time management of his portal/splenic vein thrombosis was deferred to Dr. Francetta Found. Patient's abnormal liver imaging showing cirrhosis was discussed. Recent liver biopsy did not show cirrhosis. Plan was to monitor labs over time. Patient's iron deficiency anemia in the setting of anticoagulation was discussed. This was thought perhaps due to gastritis and AVMs on capsule study. Iron studies and CBC were ordered that day for monitoring. Patient also had a CT scheduled due to nonspecific haziness noted in the omental fat near the umbilicus on CT recently.   Patient had repeat CT of the abdomen and pelvis with contrast 08/01/17 this showed a focal 3 cm area of soft tissue attenuation identified in the central omentum which was stable since 04/25/17 and new since 04/29/16. 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 It was recommended he have follow-up in 6 months with repeat imaging study.   Patient did call our clinic yesterday with complaints of right lower quadrant abdominal pain and CBC, CMP and lipase were ordered. These were normal.   Today, the patient presents to clinic accompanied by his wife and explains that a few days after his recent CT, approximately 08/04/17 he started with a right flank pain that he rates as an 8-9 out of his pain scale of "20". Patient explains that he tried drinking a lot of water last week, but this pain was almost constant. This was somewhat worse when he would go to change  position, either rollover in bed or stand up from a sitting position. Most recently last night this pain seemed to "go away" it is now a 1-2/20. He is just "aware that it was there". Patient denies any change in his urine, pain or increased frequency. Patient denies recent activity including pushing/pulling or possible muscle strain.   Patient denies fever, chills, blood in the stool, melena, change in bowel habits, weight loss, anorexia, nausea, vomiting, heartburn, reflux, abdominal pain or increase in gas or bloating.  Past Medical History:  Diagnosis Date  . DM (diabetes mellitus) ([place])   . GERD (gastroesophageal reflux disease)   . Hiatal hernia   . History of shingles 04/03/2014  . HTN (hypertension)   . Hyperlipidemia   . Insomnia    resolved by using CPAP  . Iron deficiency anemia due to chronic blood loss 11/20/2016  . Macular degeneration   . Nocturia more than twice per night 11/11/2014   For 6 month, 5 nocturias a night.   . Obesity   . 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 Portal vein thrombosis   . Rhinitis   . Situational depression   . Sleep apnea    uses CPAP every night  . Tubular adenoma 12/10/2015   6 cecum polyps    Past Surgical History:  Procedure Laterality Date  . CARDIAC CATHETERIZATION     greater 10 yrs ago, normal  . CARPAL TUNNEL RELEASE     bilateral  . COLONOSCOPY  06/2005   [facility] Dr Lajoyce Corners  . ESOPHAGOGASTRODUODENOSCOPY (EGD) WITH PROPOFOL N/A 09/28/2016  Procedure: ESOPHAGOGASTRODUODENOSCOPY (EGD) WITH PROPOFOL;  Surgeon: Gatha Mayer, MD;  Location: [facility];  Service: Endoscopy;  Laterality: N/A;  . IR GENERIC HISTORICAL  12/12/2016   IR US GUIDE VASC ACCESS RIGHT 12/12/2016 [name], MD [facility]  . IR GENERIC HISTORICAL  12/12/2016   IR VENOGRAM HEPATIC W HEMODYNAMIC EVALUATION 12/12/2016 [name], MD [facility]  . IR GENERIC HISTORICAL  12/12/2016   IR TRANSCATHETER BX 12/12/2016 [name], MD [facility]  .  ROTATOR CUFF REPAIR     right  . TENDON REPAIR     left arm  . TONSILLECTOMY    . TOTAL KNEE ARTHROPLASTY Right 2010  . WISDOM TOOTH EXTRACTION      Current Outpatient Prescriptions  Medication Sig Dispense Refill  . CVS B-12 500 MCG SUBL Place 1 tablet under the tongue daily with breakfast.   12  . desmopressin (DDAVP) 0.2 MG tablet Take 0.4 mg by mouth at bedtime.     [name] glimepiride (AMARYL) 2 MG tablet Take 2-4 mg by mouth 2 (two) times daily. Take 25ms in the morning and 269m at night    . metFORMIN (GLUCOPHAGE-XR) 500 MG 24 hr tablet Take 500 mg by mouth 2 (two) times daily.     . ONE TOUCH ULTRA TEST test strip by Other route as needed.     . pantoprazole (PROTONIX) 40 MG tablet TAKE 1 TABLET (40 MG TOTAL) BY MOUTH DAILY BEFORE BREAKFAST. 30 tablet 3  . PRESCRIPTION MEDICATION every 8 (eight) weeks. Gets injections in right eye at dr's office every 8 weeks    . rivaroxaban (XARELTO) 10 MG TABS tablet Take 1 tablet (10 mg total) by mouth daily. 30 tablet 11  . rosuvastatin (CRESTOR) 20 MG tablet Take 20 mg by mouth at bedtime.     . Marland Kitchenobramycin (TOBREX) 0.3 % ophthalmic solution Place 1 drop into the right eye every 4 (four) hours. X 5 days (Patient taking differently: Place 1 drop into the right eye every 4 (four) hours. Use 4 times daily the 2 days before, the day of, and the 2 days after eye injections) 5 mL 0  . Triamcinolone Acetonide (KENALOG IJ) Inject as directed once.    . valsartan-hydrochlorothiazide (DIOVAN-HCT) 320-25 MG per tablet Take 0.5 tablets by mouth daily with breakfast.     . verapamil (CALAN-SR) 240 MG CR tablet Take 240 mg by mouth daily.    . Marland KitchenIAGRA 100 MG tablet Take 100 mg by mouth daily as needed for erectile dysfunction.   1   No current facility-administered medications for this visit.     Allergies as of 08/16/2017  . (No Known Allergies)    Family History  Problem Relation Age of Onset  . Colon cancer Unknown   . Heart failure Unknown   . Heart  attack Unknown   . Lung disease Father        history  . Diabetes Mother        history  . Stroke Mother        history  . Colon polyps Neg Hx   . Rectal cancer Neg Hx   . Stomach cancer Neg Hx   . Esophageal cancer Neg Hx     Social History   Social History  . Marital status: Married    Spouse name: N/A  . Number of children: 1  . Years of education: N/A   Occupational History  . Not on file.   Social History Main Topics  .  Smoking status: Never Smoker  . Smokeless tobacco: Never Used     Comment: never used tobacco  . Alcohol use 0.0 oz/week     Comment: rarely   . Drug use: No  . Sexual activity: Not on file   Other Topics Concern  . Not on file   Social History Narrative   Right handed.  Caffeine 3 cups daily,  Married 2 kids (one deceased).  College grad.  [employer].     Review of Systems:    Constitutional: No weight loss, fever or chills Cardiovascular: No chest pain  Respiratory: No SOB Gastrointestinal: See HPI and otherwise negative   Physical Exam:  Vital signs: BP 110/70   Pulse 74   Ht 5' 4"  (1.626 m)   Wt 234 lb (106.1 kg)   BMI 40.17 kg/m    Constitutional:   Very Pleasant overweight Caucasian male appears to be in NAD, Well developed, Well nourished, alert and cooperative Respiratory: Respirations even and unlabored. Lungs clear to auscultation bilaterally.   No wheezes, crackles, or rhonchi.  Cardiovascular: Normal S1, S2. No MRG. Regular rate and rhythm. No peripheral edema, cyanosis or pallor.  Gastrointestinal:  Soft, nondistended, nontender. No rebound or guarding. Normal bowel sounds. No appreciable masses or hepatomegaly. Rectal:  Not performed.  Msk:  Symmetrical without gross deformities. Without edema, no deformity or joint abnormality. Tenderness to deep palpation in Right flank, closer to midline Neurologic:  Alert and  oriented x4;  grossly normal neurologically.  Skin:   Dry and intact without  significant lesions or rashes. Psychiatric:  Demonstrates good judgement and reason without abnormal affect or behaviors.  MOST RECENT LABS AND IMAGING: CBC    Component Value Date/Time   WBC 9.0 08/15/2017 1624   RBC 5.65 08/15/2017 1624   HGB 14.4 08/15/2017 1624   HGB 14.7 06/22/2017 0809   HGB 15.3 05/18/2008 1306   HCT 45.2 08/15/2017 1624   HCT 45.7 06/22/2017 0809   HCT 46.0 05/18/2008 1306   PLT 367.0 08/15/2017 1624   PLT 172 06/22/2017 0809   PLT 278 05/18/2008 1306   MCV 80.0 08/15/2017 1624   MCV 81 (L) 06/22/2017 0809   MCV 82.0 05/18/2008 1306   MCH 25.9 (L) 06/22/2017 0809   MCH 21.1 (L) 12/12/2016 0750   MCHC 31.8 08/15/2017 1624   RDW 18.0 (H) 08/15/2017 1624   RDW 18.1 (H) 06/22/2017 0809   RDW 15.9 (H) 05/18/2008 1306   LYMPHSABS 1.3 08/15/2017 1624   LYMPHSABS 0.8 (L) 06/22/2017 0809   LYMPHSABS 1.4 05/18/2008 1306   MONOABS 1.5 (H) 08/15/2017 1624   MONOABS 0.8 05/18/2008 1306   EOSABS 0.2 08/15/2017 1624   EOSABS 0.1 06/22/2017 0809   BASOSABS 0.1 08/15/2017 1624   BASOSABS 0.0 06/22/2017 0809   BASOSABS 0.0 05/18/2008 1306    CMP     Component Value Date/Time   NA 135 08/15/2017 1624   NA 135 06/22/2017 0809   NA 134 (L) 05/10/2017 0755   K 3.7 08/15/2017 1624   K 4.1 06/22/2017 0809   K 4.2 05/10/2017 0755   CL 103 08/15/2017 1624   CL 107 06/22/2017 0809   CO2 25 08/15/2017 1624   CO2 23 06/22/2017 0809   CO2 20 (L) 05/10/2017 0755   GLUCOSE 178 (H) 08/15/2017 1624   GLUCOSE 209 (H) 06/22/2017 0809   BUN 13 08/15/2017 1624   BUN 14 06/22/2017 0809   BUN 21.0 05/10/2017 0755   CREATININE 1.06 08/15/2017  1624   CREATININE 1.4 (H) 06/22/2017 0809   CREATININE 1.3 05/10/2017 0755   CALCIUM 10.3 08/15/2017 1624   CALCIUM 10.3 06/22/2017 0809   CALCIUM 10.3 05/10/2017 0755   PROT 6.6 08/15/2017 1624   PROT 6.6 06/22/2017 0809   PROT 6.9 05/10/2017 0755   ALBUMIN 4.0 08/15/2017 1624   ALBUMIN 3.6 06/22/2017 0809   ALBUMIN 3.9  05/10/2017 0755   AST 17 08/15/2017 1624   AST 28 06/22/2017 0809   AST 25 05/10/2017 0755   ALT 18 08/15/2017 1624   ALT 29 06/22/2017 0809   ALT 26 05/10/2017 0755   ALKPHOS 89 08/15/2017 1624   ALKPHOS 88 (H) 06/22/2017 0809   ALKPHOS 97 05/10/2017 0755   BILITOT 0.5 08/15/2017 1624   BILITOT 0.70 06/22/2017 0809   BILITOT 0.70 05/10/2017 0755   GFRNONAA >60 12/12/2016 0750   GFRAA >60 12/12/2016 0750    Assessment: 1. Right flank pain: Recent CT of abdomen and pelvis did not show etiology for described right flank pain, labs including CBC, CMP and lipase were normal yesterday, pain has decreased greatly overnight; consider musculoskeletal etiology most likely vs possibly passed kidney stone or other  Plan: 1. Discussed case with Dr. Havery Moros at time patient's appointment.  2. Patient to follow in clinic as needed in the future. Did discuss with him that if symptoms increase or worsen or he develops fever or chills or has a change in bowel habits or any other concerning symptoms he should let us know and/or proceed to the ER. 3. Patient to follow in clinic as needed in the near future.  Ellouise Newer, PA-C [facility] 08/16/2017, 1:02 PM

## 2017-08-16 NOTE — Progress Notes (Signed)
Agree with assessment and plan as outlined. Labs stable, recent CT done. Pain appears positional, perhaps musculoskeletal. It appears to be resolving, he can let us know if it recurs.

## 2017-08-23 ENCOUNTER — Other Ambulatory Visit (HOSPITAL_BASED_OUTPATIENT_CLINIC_OR_DEPARTMENT_OTHER): Payer: 59

## 2017-08-23 ENCOUNTER — Ambulatory Visit (HOSPITAL_BASED_OUTPATIENT_CLINIC_OR_DEPARTMENT_OTHER): Payer: 59 | Admitting: Hematology & Oncology

## 2017-08-23 VITALS — BP 112/68 | HR 72 | Temp 98.3°F | Resp 20 | Wt 236.1 lb

## 2017-08-23 DIAGNOSIS — R161 Splenomegaly, not elsewhere classified: Secondary | ICD-10-CM | POA: Diagnosis not present

## 2017-08-23 DIAGNOSIS — E119 Type 2 diabetes mellitus without complications: Secondary | ICD-10-CM | POA: Diagnosis not present

## 2017-08-23 DIAGNOSIS — D45 Polycythemia vera: Secondary | ICD-10-CM

## 2017-08-23 DIAGNOSIS — I81 Portal vein thrombosis: Secondary | ICD-10-CM

## 2017-08-23 DIAGNOSIS — K7581 Nonalcoholic steatohepatitis (NASH): Secondary | ICD-10-CM

## 2017-08-23 DIAGNOSIS — K7460 Unspecified cirrhosis of liver: Secondary | ICD-10-CM

## 2017-08-23 LAB — CBC WITH DIFFERENTIAL (CANCER CENTER ONLY)
BASO#: 0 10*3/uL (ref 0.0–0.2)
BASO%: 0.3 % (ref 0.0–2.0)
EOS ABS: 0.3 10*3/uL (ref 0.0–0.5)
EOS%: 3.4 % (ref 0.0–7.0)
HEMATOCRIT: 44.7 % (ref 38.7–49.9)
HEMOGLOBIN: 14.7 g/dL (ref 13.0–17.1)
LYMPH#: 0.8 10*3/uL — AB (ref 0.9–3.3)
LYMPH%: 11.4 % — ABNORMAL LOW (ref 14.0–48.0)
MCH: 25.7 pg — AB (ref 28.0–33.4)
MCHC: 32.9 g/dL (ref 32.0–35.9)
MCV: 78 fL — ABNORMAL LOW (ref 82–98)
MONO#: 1 10*3/uL — ABNORMAL HIGH (ref 0.1–0.9)
MONO%: 13.8 % — AB (ref 0.0–13.0)
NEUT%: 71.1 % (ref 40.0–80.0)
NEUTROS ABS: 5.2 10*3/uL (ref 1.5–6.5)
Platelets: 307 10*3/uL (ref 145–400)
RBC: 5.71 10*6/uL — ABNORMAL HIGH (ref 4.20–5.70)
RDW: 18.7 % — AB (ref 11.1–15.7)
WBC: 7.4 10*3/uL (ref 4.0–10.0)

## 2017-08-23 LAB — CMP (CANCER CENTER ONLY)
ALBUMIN: 3.5 g/dL (ref 3.3–5.5)
ALT(SGPT): 27 U/L (ref 10–47)
AST: 26 U/L (ref 11–38)
Alkaline Phosphatase: 100 U/L — ABNORMAL HIGH (ref 26–84)
BILIRUBIN TOTAL: 0.8 mg/dL (ref 0.20–1.60)
BUN, Bld: 14 mg/dL (ref 7–22)
CALCIUM: 9.9 mg/dL (ref 8.0–10.3)
CHLORIDE: 105 meq/L (ref 98–108)
CO2: 24 meq/L (ref 18–33)
Creat: 1 mg/dl (ref 0.6–1.2)
Glucose, Bld: 234 mg/dL — ABNORMAL HIGH (ref 73–118)
POTASSIUM: 4 meq/L (ref 3.3–4.7)
Sodium: 137 mEq/L (ref 128–145)
Total Protein: 6.6 g/dL (ref 6.4–8.1)

## 2017-08-23 LAB — IRON AND TIBC
%SAT: 8 % — AB (ref 20–55)
IRON: 33 ug/dL — AB (ref 42–163)
TIBC: 394 ug/dL (ref 202–409)
UIBC: 361 ug/dL (ref 117–376)

## 2017-08-23 LAB — FERRITIN: Ferritin: 29 ng/ml (ref 22–316)

## 2017-08-23 NOTE — Progress Notes (Signed)
Hematology and Oncology Follow Up Visit  James [name] [id number] Jun 09, 1946 71 y.o. 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 IV iron as indicated - last received in Decemeber 2017 x 2     Interim History:  James [name] here today for a follow-up. He is quite nervous. Apparently, he had a CT scan of his abdomen about 3 or 4 weeks ago. I'm not sure as to why this was done.  The CT scan showed a 14 mm subcapsular lesion in the left lobe of the liver. There was some interval evolution of splenic infarct with chronic occlusion of the splenic vein. 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  He does have polycythemia. I suppose this might increase his risk for thromboembolic disease.  He now is on maintenance Xarelto. He restarted testosterone. Testosterone is made him feel a whole lot better. This is something that is being up for his quality of life.  I told him that his biggest problem clearly will be diabetes. His blood sugar this morning was 234. He will see his family doctor to have his blood sugar monitored.  He's had no obvious bleeding. In fact, bleeding has been a problem for him in the past. Back in July, his ferritin was 19 with iron saturation of 10%. We actually have had to give him IV iron because of symptom exacerbation of iron deficiency.  He is still working. [place]  He really is not exercising. He used to swim. I don't think he does this anymore.  Overall, his performance status is ECOG 1.  Medications:  Allergies as of 08/23/2017      Reactions   Ibuprofen Other (See Comments)   Upper GI Bled      Medication List       Accurate as of 08/23/17   8:40 AM. Always use your most recent med list.          CVS B-12 500 MCG Subl Generic drug:  Cyanocobalamin Place 1 tablet under the tongue daily with breakfast.   desmopressin 0.2 MG tablet Commonly known as:  DDAVP Take 0.4 mg by mouth at bedtime.   glimepiride 2 MG tablet Commonly known as:  AMARYL Take 2-4 mg by mouth 2 (two) times daily. Take 421m in the morning and 65m74mat night   losartan-hydrochlorothiazide 100-12.5 MG tablet Commonly known as:  HYZAAR Take 1 tablet by mouth daily.   metFORMIN 500 MG 24 hr tablet Commonly known as:  GLUCOPHAGE-XR Take 500 mg by mouth 2 (two) times daily.   ONE TOUCH ULTRA TEST test strip Generic drug:  glucose blood by Other route as needed.   pantoprazole 40 MG tablet Commonly known as:  PROTONIX TAKE 1 TABLET (40 MG TOTAL) BY MOUTH DAILY BEFORE BREAKFAST.   PRESCRIPTION MEDICATION every 8 (eight) weeks. Gets injections in right eye at dr's office every 8 weeks   rivaroxaban 10 MG Tabs tablet Commonly known as:  XARELTO Take 1 tablet (10 mg total) by mouth daily.   rosuvastatin 20 MG tablet Commonly known as:  CRESTOR Take 20 mg by mouth at bedtime.   testosterone cypionate 200 MG/ML injection Commonly known as:  DEPOTESTOSTERONE CYPIONATE Inject into the muscle every 21 ( twenty-one) days.   tobramycin 0.3 % ophthalmic solution Commonly known as:  TOBREX Place 1 drop into the right eye every 4 (four) hours. X 5 days   verapamil 240 MG CR tablet Commonly known as:  CALAN-SR Take 240 mg by mouth daily.   VIAGRA 100 MG tablet Generic drug:  sildenafil Take 100 mg by mouth daily as needed for erectile dysfunction.            Discharge Care Instructions        Start     Ordered   08/23/17 0000  CBC with Differential [facility])     08/23/17 0839   08/23/17 0000  CMP STAT ([place] only)     08/23/17 0839   08/23/17 0000  Lactate dehydrogenase     08/23/17 0839      Allergies:    Allergies  Allergen Reactions  . Ibuprofen Other (See Comments)    Upper GI Bled    Past Medical History, Surgical history, Social history, and Family History were reviewed and updated.  Review of Systems: As stated in the interim history  Physical Exam:  weight is 236 lb 1.9 oz (107.1 kg). His oral temperature is 98.3 F (36.8 C). His blood pressure is 112/68 and his pulse is 72. His respiration is 20.   Wt Readings from Last 3 Encounters:  08/23/17 236 lb 1.9 oz (107.1 kg)  08/16/17 234 lb (106.1 kg)  05/10/17 244 lb 6.4 oz (110.9 kg)    Physical Exam  Constitutional: He is oriented to person, place, and time.  HENT:  Head: Normocephalic and atraumatic.  Mouth/Throat: Oropharynx is clear and moist.  Eyes: Pupils are equal, round, and reactive to light. EOM are normal.  Neck: Normal range of motion.  Cardiovascular: Normal rate, regular rhythm and normal heart sounds.   Pulmonary/Chest: Effort normal and breath sounds normal.  Abdominal: Soft. Bowel sounds are normal.  Musculoskeletal: Normal range of motion. He exhibits no edema, tenderness or deformity.  Lymphadenopathy:    He has no cervical adenopathy.  Neurological: He is alert and oriented to person, place, and time.  Skin: Skin is warm and dry. No rash noted. No erythema.  Psychiatric: He has a normal mood and affect. His behavior is normal. Judgment and thought content normal.  Vitals reviewed.    Lab Results  Component Value Date   WBC 7.4 08/23/2017   HGB 14.7 08/23/2017   HCT 44.7 08/23/2017   MCV 78 (L) 08/23/2017   PLT 307 08/23/2017   Lab Results  Component Value Date   FERRITIN 19 (L) 06/22/2017   IRON 39 (L) 06/22/2017   TIBC 400 06/22/2017   UIBC 361 06/22/2017   IRONPCTSAT 10 (L) 06/22/2017   Lab Results  Component Value Date   RETICCTPCT 1.7 08/30/2011   RBC 5.71 (H) 08/23/2017   RETICCTABS 94.0 08/30/2011   No results found for: Nils Pyle [facility] Lab Results   Component Value Date   IGGSERUM 828 10/11/2016   IGA 177 06/22/2016   No results found for: Odetta Pink, SPEI   Chemistry      Component Value Date/Time   NA 137 08/23/2017 0744   NA 134 (L) 05/10/2017 0755   K 4.0 08/23/2017 0744   K 4.2 05/10/2017 0755   CL 105 08/23/2017 0744   CO2 24 08/23/2017 0744   CO2 20 (L) 05/10/2017 1275  BUN 14 08/23/2017 0744   BUN 21.0 05/10/2017 0755   CREATININE 1.0 08/23/2017 0744   CREATININE 1.3 05/10/2017 0755      Component Value Date/Time   CALCIUM 9.9 08/23/2017 0744   CALCIUM 10.3 05/10/2017 0755   ALKPHOS 100 (H) 08/23/2017 0744   ALKPHOS 97 05/10/2017 0755   AST 26 08/23/2017 0744   AST 25 05/10/2017 0755   ALT 27 08/23/2017 0744   ALT 26 05/10/2017 0755   BILITOT 0.80 08/23/2017 0744   BILITOT 0.70 05/10/2017 0755     Impression and Plan: James [name] is 71 year old gentleman with polycythemia. He is JAK2 negative.  I'm really not all that worried about the splenomegaly. I reassured him about this.  For now, I think we will get him back in 2 months.  As long as he is on testosterone, think he probably needs to be on Xarelto.  I may consider doing a hypercoagulable workup on him. I just want to make sure that nothing else is going on that might be contributing to this splenic vein occlusion and this omental infarct.   Hopefully, his blood sugars will be under better control and we see him back.   He definitely does not need to be phlebotomized today.    [name], MD 9/6/20188:40 AM

## 2017-08-24 LAB — RETICULOCYTES: Reticulocyte Count: 2 % (ref 0.6–2.6)

## 2017-09-10 ENCOUNTER — Ambulatory Visit: Payer: 59 | Admitting: Hematology & Oncology

## 2017-09-10 ENCOUNTER — Other Ambulatory Visit: Payer: 59

## 2017-09-18 ENCOUNTER — Encounter: Payer: Self-pay | Admitting: Gastroenterology

## 2017-09-18 ENCOUNTER — Other Ambulatory Visit: Payer: 59

## 2017-09-18 ENCOUNTER — Ambulatory Visit: Payer: 59 | Admitting: Gastroenterology

## 2017-09-18 ENCOUNTER — Ambulatory Visit (INDEPENDENT_AMBULATORY_CARE_PROVIDER_SITE_OTHER): Payer: 59 | Admitting: Gastroenterology

## 2017-09-18 VITALS — BP 120/68 | HR 78 | Ht 64.0 in | Wt 241.0 lb

## 2017-09-18 DIAGNOSIS — K55069 Acute infarction of intestine, part and extent unspecified: Secondary | ICD-10-CM | POA: Diagnosis not present

## 2017-09-18 DIAGNOSIS — I81 Portal vein thrombosis: Secondary | ICD-10-CM

## 2017-09-18 DIAGNOSIS — R935 Abnormal findings on diagnostic imaging of other abdominal regions, including retroperitoneum: Secondary | ICD-10-CM | POA: Diagnosis not present

## 2017-09-18 NOTE — Patient Instructions (Addendum)
If you are age 72 or older, your body mass index should be between 23-30. Your Body mass index is 41.37 kg/m. If this is out of the aforementioned range listed, please consider follow up with your Primary Care Provider.  If you are age 68 or younger, your body mass index should be between 19-25. Your Body mass index is 41.37 kg/m. If this is out of the aformentioned range listed, please consider follow up with your Primary Care Provider.   You will be due for a CT in February of 2019.  Please have your labs drawn at your earliest convenience.  Thank you.

## 2017-09-18 NOTE — Progress Notes (Signed)
HPI :  71 year old male here for a follow-up visit.   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 He was started on anticoagulation with Xarelto. Incidentally noted at that time on imaging or changes concerning for underlying cirrhosis of the liver. Underwent ultrasound elastography showing F3/4 fibrosis and possible changes of cirrhosis. Along with this was anindeterminate liver lesion, for which the patient had a follow-up MRI of the liver which showed this appeared to be a benign cystic lesion. AFP was normal at the time. The patient also had some labs done to evaluate for chronic liver diseases which appeared negative. He was admitted for possible GI bleed, had an EGD done on 09/28/16 - multiple erosions / gastritis, benign fundic gland polyps, no varices noted. Clo test negative for H pylori. It was thought perhaps he had gastritis related to NSAID   He had negative labs for chronic liver diseases negative including labs for hemochromatosis.  Transjugular liver biopsy with portal pressures obtained. He has a gradient of 6 which barely met criteria for portal hypertension. Fortunately his liver biopsy does not show any evidence of cirrhosis, he has only some mild steatosis and is otherwise normal biopsy.Follow up MRI liver on 01/18/17 showed stable hepatic cyst, morphologic changes of mild cirrhosis  He had IV Iron infusion. Hgb normalized. Capsule endoscopy was performed and generally was a good prep with good views. 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   Since the last visit the patient had a CT scan on 08/01/17 showing focal 3cm area of the central omentum, thought to be related to omental infarct. He has collateralization around the SMV indicating prior  chronic thrombus of the SMV. Also with interval evolution of splenic infarcts and chronic occulusion of the splenic vein. Discussed with radiology, they recommended a repeat CT scan in 6 months. He otherwise had hypogonadism and started on testosterone therapy. He states he feels much better since starting this. Given resumption of this, Dr. [name] resumed Xarelto. No blood in the stools. No abdominal pains.   Endoscopic history: Screening colonoscopy completed on 12/10/15. 6 small cecal polyps, moderate diverticulosis in the sigmoid colon and internal hemorrhoids. Pathology revealed tubular adenomas    Past Medical History:  Diagnosis Date  . DM (diabetes mellitus) ([place])   . GERD (gastroesophageal reflux disease)   . Hiatal hernia   . History of shingles 04/03/2014  . HTN (hypertension)   . Hyperlipidemia   . Insomnia    resolved by using CPAP  . Iron deficiency anemia due to chronic blood loss 11/20/2016  . Macular degeneration   . Nocturia more than twice per night 11/11/2014   For 6 month, 5 nocturias a night.   . Obesity   . 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 Portal vein thrombosis   . Rhinitis   . Situational depression   . Sleep apnea    uses CPAP every night  . Tubular adenoma 12/10/2015   6 cecum polyps     Past Surgical History:  Procedure Laterality Date  . CARDIAC CATHETERIZATION     greater 10 yrs ago, normal  . CARPAL TUNNEL RELEASE     bilateral  . COLONOSCOPY  06/2005   [facility] Medical Dr Lajoyce Corners  . ESOPHAGOGASTRODUODENOSCOPY (EGD) WITH PROPOFOL  N/A 09/28/2016   Procedure: ESOPHAGOGASTRODUODENOSCOPY (EGD) WITH PROPOFOL;  Surgeon: Gatha Mayer, MD;  Location: [facility];  Service: Endoscopy;  Laterality: N/A;  . IR GENERIC HISTORICAL  12/12/2016   IR US GUIDE VASC ACCESS RIGHT 12/12/2016 [name], MD [facility]  . IR GENERIC HISTORICAL  12/12/2016   IR VENOGRAM HEPATIC W HEMODYNAMIC EVALUATION 12/12/2016 [name], MD [facility]  . IR GENERIC  HISTORICAL  12/12/2016   IR TRANSCATHETER BX 12/12/2016 [name], MD [facility]  . ROTATOR CUFF REPAIR     right  . TENDON REPAIR     left arm  . TONSILLECTOMY    . TOTAL KNEE ARTHROPLASTY Right 2010  . WISDOM TOOTH EXTRACTION     Family History  Problem Relation Age of Onset  . Colon cancer Unknown   . Heart failure Unknown   . Heart attack Unknown   . Lung disease Father        history  . Diabetes Mother        history  . Stroke Mother        history  . Colon polyps Neg Hx   . Rectal cancer Neg Hx   . Stomach cancer Neg Hx   . Esophageal cancer Neg Hx    Social History  Substance Use Topics  . Smoking status: Never Smoker  . Smokeless tobacco: Never Used     Comment: never used tobacco  . Alcohol use 0.0 oz/week     Comment: rarely    Current Outpatient Prescriptions  Medication Sig Dispense Refill  . [facility] B-12 500 MCG SUBL Place 1 tablet under the tongue daily with breakfast.   12  . desmopressin (DDAVP) 0.2 MG tablet Take 0.4 mg by mouth at bedtime.     [name] glimepiride (AMARYL) 2 MG tablet Take 2-4 mg by mouth 2 (two) times daily. Take 20ms in the morning and 239m at night    . losartan-hydrochlorothiazide (HYZAAR) 100-12.5 MG tablet Take 1 tablet by mouth daily.    . metFORMIN (GLUCOPHAGE-XR) 500 MG 24 hr tablet Take 500 mg by mouth 2 (two) times daily.     . ONE TOUCH ULTRA TEST test strip by Other route as needed.     . pantoprazole (PROTONIX) 40 MG tablet TAKE 1 TABLET (40 MG TOTAL) BY MOUTH DAILY BEFORE BREAKFAST. 30 tablet 3  . PRESCRIPTION MEDICATION every 8 (eight) weeks. Gets injections in right eye at dr's office every 8 weeks    . rivaroxaban (XARELTO) 10 MG TABS tablet Take 1 tablet (10 mg total) by mouth daily. 30 tablet 11  . rosuvastatin (CRESTOR) 20 MG tablet Take 20 mg by mouth at bedtime.     . Marland Kitchenestosterone cypionate (DEPOTESTOSTERONE CYPIONATE) 200 MG/ML injection Inject into the muscle every 21 ( twenty-one) days.     . Marland Kitchenobramycin (TOBREX) 0.3  % ophthalmic solution Place 1 drop into the right eye every 4 (four) hours. X 5 days (Patient taking differently: Place 1 drop into the right eye every 4 (four) hours. Use 4 times daily the 2 days before, the day of, and the 2 days after eye injections) 5 mL 0  . verapamil (CALAN-SR) 240 MG CR tablet Take 240 mg by mouth daily.    . Marland KitchenIAGRA 100 MG tablet Take 100 mg by mouth daily as needed for erectile dysfunction.   1   No current facility-administered medications for this visit.    Allergies  Allergen Reactions  . Ibuprofen Other (See Comments)  Upper GI Bled     Review of Systems: All systems reviewed and negative except where noted in HPI.   Lab Results  Component Value Date   WBC 7.4 08/23/2017   HGB 14.7 08/23/2017   HCT 44.7 08/23/2017   MCV 78 (L) 08/23/2017   PLT 307 08/23/2017    Lab Results  Component Value Date   IRON 33 (L) 08/23/2017   TIBC 394 08/23/2017   FERRITIN 29 08/23/2017    Lab Results  Component Value Date   CREATININE 1.0 08/23/2017   BUN 14 08/23/2017   NA 137 08/23/2017   K 4.0 08/23/2017   CL 105 08/23/2017   CO2 24 08/23/2017    Lab Results  Component Value Date   ALT 27 08/23/2017   AST 26 08/23/2017   ALKPHOS 100 (H) 08/23/2017   BILITOT 0.80 08/23/2017     Physical Exam: BP 120/68   Pulse 78   Ht _0  (1.626 m)   Wt 241 lb (109.3 kg)   BMI 41.37 kg/m  Constitutional: Pleasant,well-developed, male in no acute distress. HEENT: Normocephalic and atraumatic. Conjunctivae are normal. No scleral icterus. Neck supple.  Cardiovascular: Normal rate, regular rhythm.  Pulmonary/chest: Effort normal and breath sounds normal. No wheezing, rales or rhonchi. Abdominal: Soft protuberant, nontender. . There are no masses palpable.  Extremities: no edema Lymphadenopathy: No cervical adenopathy noted. Neurological: Alert and oriented to person place and time. Skin: Skin is warm and dry. No rashes noted. Psychiatric: Normal mood and  affect. Behavior is normal.   ASSESSMENT AND PLAN:  71 year old male here for reassessment following issues:  Portal / splenic vein / SMV thrombosis / omental infarcts - suspected to be related to polycythemia vera, now on anticoagulation for Xarelto which will be continued given his testosterone supplementation. He is followed with Dr. [name] for this. I have reviewed the radiology findings with radiology, they have recommended a follow-up CT scan in 6 months given these changes have evolved over time, and ensure that the finding of omental infarct is accurate without interval concerning changes. I have discussed this with him and he agreed to the plan, recall CT scan in 6 months.   Abnormal liver imaging - as above, no evidence of cirrhosis based on transjugular liver biopsy.   History of iron deficiency anemia / GI bleed - resolved, occured the setting of anticoagulation previously, perhaps due to gastritis and/or AVMs on capsule study. CBC stable at this time, will monitor over time.  Cove Cellar, MD [facility] Pager [phone]

## 2017-09-24 ENCOUNTER — Emergency Department (HOSPITAL_COMMUNITY): Admission: EM | Admit: 2017-09-24 | Discharge: 2017-09-24 | Discharge status: Absent without leave | Payer: 59

## 2017-09-25 ENCOUNTER — Ambulatory Visit: Payer: 59 | Admitting: Neurology

## 2017-10-18 ENCOUNTER — Ambulatory Visit: Payer: 59 | Admitting: Neurology

## 2017-10-25 ENCOUNTER — Other Ambulatory Visit: Payer: 59

## 2017-10-25 ENCOUNTER — Ambulatory Visit: Payer: 59 | Admitting: Hematology & Oncology

## 2017-10-28 ENCOUNTER — Encounter: Payer: Self-pay | Admitting: Neurology

## 2017-10-29 ENCOUNTER — Encounter: Payer: Self-pay | Admitting: Hematology & Oncology

## 2017-10-29 ENCOUNTER — Other Ambulatory Visit: Payer: Self-pay

## 2017-10-29 ENCOUNTER — Other Ambulatory Visit (HOSPITAL_BASED_OUTPATIENT_CLINIC_OR_DEPARTMENT_OTHER): Payer: 59

## 2017-10-29 ENCOUNTER — Ambulatory Visit (HOSPITAL_BASED_OUTPATIENT_CLINIC_OR_DEPARTMENT_OTHER): Payer: 59 | Admitting: Hematology & Oncology

## 2017-10-29 VITALS — BP 124/60 | HR 69 | Temp 97.9°F | Resp 17 | Wt 243.0 lb

## 2017-10-29 DIAGNOSIS — R161 Splenomegaly, not elsewhere classified: Secondary | ICD-10-CM

## 2017-10-29 DIAGNOSIS — D45 Polycythemia vera: Secondary | ICD-10-CM

## 2017-10-29 DIAGNOSIS — K769 Liver disease, unspecified: Secondary | ICD-10-CM | POA: Diagnosis not present

## 2017-10-29 DIAGNOSIS — Z7901 Long term (current) use of anticoagulants: Secondary | ICD-10-CM | POA: Diagnosis not present

## 2017-10-29 LAB — CBC WITH DIFFERENTIAL (CANCER CENTER ONLY)
BASO#: 0 10*3/uL (ref 0.0–0.2)
BASO%: 0.4 % (ref 0.0–2.0)
EOS%: 1.8 % (ref 0.0–7.0)
Eosinophils Absolute: 0.2 10*3/uL (ref 0.0–0.5)
HCT: 41.3 % (ref 38.7–49.9)
HGB: 12.6 g/dL — ABNORMAL LOW (ref 13.0–17.1)
LYMPH#: 1.2 10*3/uL (ref 0.9–3.3)
LYMPH%: 15 % (ref 14.0–48.0)
MCH: 23 pg — ABNORMAL LOW (ref 28.0–33.4)
MCHC: 30.5 g/dL — ABNORMAL LOW (ref 32.0–35.9)
MCV: 75 fL — ABNORMAL LOW (ref 82–98)
MONO#: 1.3 10*3/uL — ABNORMAL HIGH (ref 0.1–0.9)
MONO%: 16.1 % — AB (ref 0.0–13.0)
NEUT#: 5.5 10*3/uL (ref 1.5–6.5)
NEUT%: 66.7 % (ref 40.0–80.0)
PLATELETS: 354 10*3/uL (ref 145–400)
RBC: 5.49 10*6/uL (ref 4.20–5.70)
RDW: 19.9 % — ABNORMAL HIGH (ref 11.1–15.7)
WBC: 8.2 10*3/uL (ref 4.0–10.0)

## 2017-10-29 LAB — CMP (CANCER CENTER ONLY)
ALBUMIN: 3.6 g/dL (ref 3.3–5.5)
ALT(SGPT): 27 U/L (ref 10–47)
AST: 25 U/L (ref 11–38)
Alkaline Phosphatase: 77 U/L (ref 26–84)
BILIRUBIN TOTAL: 0.5 mg/dL (ref 0.20–1.60)
BUN, Bld: 14 mg/dL (ref 7–22)
CHLORIDE: 101 meq/L (ref 98–108)
CO2: 25 mEq/L (ref 18–33)
CREATININE: 1.4 mg/dL — AB (ref 0.6–1.2)
Calcium: 10.3 mg/dL (ref 8.0–10.3)
Glucose, Bld: 200 mg/dL — ABNORMAL HIGH (ref 73–118)
Potassium: 3.6 mEq/L (ref 3.3–4.7)
SODIUM: 137 meq/L (ref 128–145)
TOTAL PROTEIN: 6.5 g/dL (ref 6.4–8.1)

## 2017-10-29 LAB — LACTATE DEHYDROGENASE: LDH: 202 U/L (ref 125–245)

## 2017-10-29 NOTE — Progress Notes (Signed)
Hematology and Oncology Follow Up Visit  Leevon Upperman [id number] [birth year] 71 y.o. 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 IV iron as indicated - last received in Decemeber 2017 x 2     Interim History:  Mr. Olguin is here today for a follow-up.  We last saw him back in early September.  Since then, he is doing okay.  He really had no specific complaints.  There is no abdominal pain.  He has had no nausea or vomiting.  He has had no cough.  He has had no fever.  He has had no leg swelling.  He did have a CT scan done back in August.  The radiologist said that there was a 14 mm subcapsular lesion in the medial segment of the left lobe of the liver.  I am not sure exactly what this signifies.  He has had some splenic infarcts.  He is on lifelong low-dose Xarelto.  He has to take testosterone because this makes him feel better.  He has had no bleeding.  He has had no change in bowel or bladder habits.  He has had no headaches.    Overall, his performance status is ECOG 1.  Medications:  Allergies as of 10/29/2017      Reactions   Ibuprofen Other (See Comments)   Upper GI Bled      Medication List        Accurate as of 10/29/17  2:25 PM. Always use your most recent med list.          [employer] B-12 500 MCG Subl Generic drug:  Cyanocobalamin Place 1 tablet under the tongue daily with breakfast.   desmopressin 0.2 MG tablet Commonly known as:  DDAVP Take 0.4 mg by mouth at bedtime.   glimepiride 2 MG tablet Commonly known as:  AMARYL Take 2-4 mg by mouth 2 (two) times daily. Take 31ms in the morning and 232m at night   losartan-hydrochlorothiazide 100-12.5 MG tablet Commonly known as:  HYZAAR Take 1 tablet by mouth daily.   metFORMIN 500 MG 24 hr tablet Commonly known as:  GLUCOPHAGE-XR Take 500 mg  by mouth 2 (two) times daily.   ONE TOUCH ULTRA TEST test strip Generic drug:  glucose blood by Other route as needed.   pantoprazole 40 MG tablet Commonly known as:  PROTONIX TAKE 1 TABLET (40 MG TOTAL) BY MOUTH DAILY BEFORE BREAKFAST.   PRESCRIPTION MEDICATION every 8 (eight) weeks. Gets injections in right eye at dr's office every 8 weeks   rivaroxaban 10 MG Tabs tablet Commonly known as:  XARELTO Take 1 tablet (10 mg total) by mouth daily.   rosuvastatin 20 MG tablet Commonly known as:  CRESTOR Take 20 mg by mouth at bedtime.   testosterone cypionate 200 MG/ML injection Commonly known as:  DEPOTESTOSTERONE CYPIONATE Inject into the muscle every 21 ( twenty-one) days.   tobramycin 0.3 % ophthalmic solution Commonly known as:  TOBREX Place 1 drop into the right eye every 4 (four) hours. X 5 days   verapamil 240 MG CR tablet Commonly known as:  CALAN-SR Take 240 mg by mouth daily.   VIAGRA 100 MG tablet Generic drug:  sildenafil Take 100 mg by mouth daily as needed for erectile dysfunction.       Allergies:  Allergies  Allergen Reactions  . Ibuprofen Other (  See Comments)    Upper GI Bled    Past Medical History, Surgical history, Social history, and Family History were reviewed and updated.  Review of Systems: As stated in the interim history  Physical Exam:  weight is 243 lb (110.2 kg). His oral temperature is 97.9 F (36.6 C). His blood pressure is 124/60 and his pulse is 69. His respiration is 17 and oxygen saturation is 95%.   Wt Readings from Last 3 Encounters:  10/29/17 243 lb (110.2 kg)  09/18/17 241 lb (109.3 kg)  08/23/17 236 lb 1.9 oz (107.1 kg)    Physical Exam  Constitutional: He is oriented to person, place, and time.  HENT:  Head: Normocephalic and atraumatic.  Mouth/Throat: Oropharynx is clear and moist.  Eyes: EOM are normal. Pupils are equal, round, and reactive to light.  Neck: Normal range of motion.  Cardiovascular: Normal  rate, regular rhythm and normal heart sounds.  Pulmonary/Chest: Effort normal and breath sounds normal.  Abdominal: Soft. Bowel sounds are normal.  Musculoskeletal: Normal range of motion. He exhibits no edema, tenderness or deformity.  Lymphadenopathy:    He has no cervical adenopathy.  Neurological: He is alert and oriented to person, place, and time.  Skin: Skin is warm and dry. No rash noted. No erythema.  Psychiatric: He has a normal mood and affect. His behavior is normal. Judgment and thought content normal.  Vitals reviewed.    Lab Results  Component Value Date   WBC 8.2 10/29/2017   HGB 12.6 (L) 10/29/2017   HCT 41.3 10/29/2017   MCV 75 (L) 10/29/2017   PLT 354 10/29/2017   Lab Results  Component Value Date   FERRITIN 29 08/23/2017   IRON 33 (L) 08/23/2017   TIBC 394 08/23/2017   UIBC 361 08/23/2017   IRONPCTSAT 8 (L) 08/23/2017   Lab Results  Component Value Date   RETICCTPCT 1.7 08/30/2011   RBC 5.49 10/29/2017   RETICCTABS 94.0 08/30/2011   No results found for: Nils Pyle [facility] Lab Results  Component Value Date   IGGSERUM 828 10/11/2016   IGA 177 06/22/2016   No results found for: Odetta Pink, SPEI   Chemistry      Component Value Date/Time   NA 137 10/29/2017 1307   NA 134 (L) 05/10/2017 0755   K 3.6 10/29/2017 1307   K 4.2 05/10/2017 0755   CL 101 10/29/2017 1307   CO2 25 10/29/2017 1307   CO2 20 (L) 05/10/2017 0755   BUN 14 10/29/2017 1307   BUN 21.0 05/10/2017 0755   CREATININE 1.4 (H) 10/29/2017 1307   CREATININE 1.3 05/10/2017 0755      Component Value Date/Time   CALCIUM 10.3 10/29/2017 1307   CALCIUM 10.3 05/10/2017 0755   ALKPHOS 77 10/29/2017 1307   ALKPHOS 97 05/10/2017 0755   AST 25 10/29/2017 1307   AST 25 05/10/2017 0755   ALT 27 10/29/2017 1307   ALT 26 05/10/2017 0755   BILITOT 0.50 10/29/2017 1307   BILITOT 0.70 05/10/2017 0755     Impression  and Plan: Mr. Gurney is 71 year old gentleman with polycythemia. He is JAK2 negative.  I'm really not all that worried about the splenomegaly. I reassured him about this.  From my point of view, everything looks okay.  He does not need to be phlebotomized.  I do worry about his weight.  He just weighs too much.  I think the weight is going to be a problem for  him in the future.  We will plan to get him back in 4 months.  We will hopefully find that he has lost a little weight after 4 months.  He will stay on his Xarelto.  He needs the testosterone to make him feel better.  As long as he is on testosterone, he will be on Xarelto.     [name], MD 11/12/20182:25 PM

## 2017-10-30 ENCOUNTER — Ambulatory Visit: Payer: 59 | Admitting: Neurology

## 2017-10-30 ENCOUNTER — Encounter: Payer: Self-pay | Admitting: Neurology

## 2017-10-30 VITALS — BP 119/77 | HR 91 | Ht 66.0 in | Wt 242.0 lb

## 2017-10-30 DIAGNOSIS — E291 Testicular hypofunction: Secondary | ICD-10-CM

## 2017-10-30 DIAGNOSIS — G4733 Obstructive sleep apnea (adult) (pediatric): Secondary | ICD-10-CM | POA: Diagnosis not present

## 2017-10-30 DIAGNOSIS — Z9989 Dependence on other enabling machines and devices: Secondary | ICD-10-CM

## 2017-10-30 LAB — PROTEIN S, TOTAL: PROTEIN S AG TOTAL: 66 % (ref 60–150)

## 2017-10-30 LAB — ANTITHROMBIN III: Antithrombin Activity: 111 % (ref 75–135)

## 2017-10-30 LAB — PROTEIN C ACTIVITY: Protein C-Functional: 82 % (ref 73–180)

## 2017-10-30 LAB — PROTEIN S ACTIVITY: PROTEIN S ACTIVITY: 102 % (ref 63–140)

## 2017-10-30 NOTE — Progress Notes (Signed)
PATIENT: James [name]. DOB: [birth year]  REASON FOR VISIT: follow up HISTORY FROM: patient  HISTORY OF PRESENT ILLNESS: HISTORY 10/29/15: James [name]. is a 71 y.o. male ,seen here as a referral from Dr. Philip Aspen  and upon recommendation of his Urologist for a sleep evaluation.  He was recently told by Dr. Sharlett Iles as well as by his urologist that his nocturia is not related to any prostate pathology that he had no trouble emptying his full bladder. Since he is also obese to Dr. Philip Aspen suggested to have a sleep apnea evaluation as a possible cause for the nocturia. The patient reports that he on some nights have restless legs. He often feels not restored or refreshed in the morning feeling like he didn't get enough sleep at night. He has some muscle cramps as well coughing and shortness of breath has been reported daytime fatigue and hearing loss. He has a history of hypertension, diabetes, obesity, and high cholesterol.   It is review of systems he also endorsed fatigue at 30 points the Epworth Sleepiness Scale at 13 points which indicates hypersomnia and the geriatric depression score at one point.   Dr. Philip Aspen also listed that the patient has hypogonadism, and is on testosterone replacements, he was diagnosed with vitamin B12 deficiency pernicious anemia but the level is still considered borderline and he was given or prescribed sublingual vitamin B12 tablets, he has allergic rhinitis which may interfere with CPAP use as needed. His diabetes is controlled on metformin and glimepiride.  He is also on Diovan which contains a diuretic and takes it in the morning. He has peripheral vascular disease but no claudications and suffered in the past from shingles and has postherpetic neuralgia he has polycythemia vera . Problem #1 on his list was insomnia which really seems to be related to nocturia as cause for  inability to maintain sleep. He goes to bed at 8 PM and is asleep by 8.05, he  jokes. He falls asleep in church , in cinema and during TV or reading. He wakes at 4.30 and has been meanwhile up 4-5 times. His first nocturia is after 4 hours and then every 60- 90 minutes.   He doesn't nap  at work. 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  James [name] reports that he has a son that also has sleep apnea and is actually depending happily on the use of his CPAP machine.  He lost one son to suicide. His mother and aunts had sleepiness.   01-29-15 James [name] underwent a split night polysomnography on 11-26-14 after he had endorsed the Epworth sleepiness score at an elevated level of 13 points. He has a past medical history including polycythemia vera obesity dyspnea and hypertension and diabetes. In addition he had nocturia more than 3 times at night. He was diagnosed with an AHI of 24.8 and an RDI of 26.5 he slept not on his back but only on his side during the night in the sleep lab he also had very frequent periodic limb movements his PLM arousals were 18.5 per hour. He was titrated to 7 cm CPAP with a reduction of the AHI to 5.4 the now residual apneas were mostly central in nature. His PLM still continued as he was on CPAP. I see him today for his first visit after the sleep study he endorsed today the Epworth sleepiness score at 6 points which is quite reduced in comparison and his  fatigue severity at 19 points. He has been using an O2 sat CPAP machine with a minimum pressure of 5 cm water and a maximum pressure of 12 cm water the 95 st percentile of pressure is 11.2 He is 100% compliance for 30 days and 97% compliance for over 4 hours of use, average time of use of 6 hours 59 minutes EPR level 3 cm water.  I would like for this patient to remain on this AutoSet machine-  his residual AHI is only 1.2 , there are no central apneas. He does have a mild air leak. He uses a nasal covering mask.  He has some pressure marks around the nose and I suggested  to try and nasal pillow. He had not seen one before also the sleep lab staff is supposed to offer several options to the patient. He does like the res med airfit  P 10.I would like to add that the patient still has 3 nocturia is at night in spite of having sufficiently even wonderfully treated.  Interval history from 09-28-15, James [name] is here today for follow-up visit with an excellent compliance report. He has used the machine 100% of the last 30 days over 4 hours with an average user time of 7 hours and 54 minutes. He remains on AUTO set 5-12 cm water with 3 cm EPR. He has minor air leaks his 95th percentile pressure is 11.7. The AHI is 0.9. The patient reports sleeping through the night and having only 2 bathroom breaks down from 3 or 4 before being treated for OSA. He tolerates the CPAP well and his Epworth sleepiness score has decreased to 8 points his fatigue severity 27 points and he does not indicate a significant depression on his geriatric depression scale.  Today 09/27/2016:James [name] is a 71 year old male with a history of obstructive sleep apnea on CPAP. He returns today for a compliance download. His download indicates that he uses machine 30 out of 30 days for compliance of 100%. Each night he uses machine greater than 4 hours. On average he uses his machine 7 hours and 30 minutes. His residual AHI is 0.6 on a minimum pressure of 5 cm water and maximum pressure of 12 cm water with EPR 3. His leak in the 95th percentile is 16.9 L/m. Overall he feels that he is doing well. He continues to use the fullface mask. His Epworth sleepiness score is 5. He returns today for an evaluation.  Interval history from 30 October 2016,  I have the pleasure of seeing James [name] today who has been a compliant CPAP user.  Yesterday's compliance download showed 30 days was 97% compliance in hours an average of 7 hours and 38 minutes was reached, he is using an AutoSet between 5 and 12 cmH2O was 3 cm  EPR his residual AHI is reduced to 0.5/h, is 95th percentile pressure is 11.7.  There are no central apneas emerging, his fatigue severity score is low at 17 point and his Epworth Sleepiness Scale was endorsed at six-points. He is very happy with CPAP and it's positive effects on alertness.     REVIEW OF SYSTEMS: Out of a complete 14 system review of symptoms, the patient complains only of the following symptoms, and all other reviewed systems are negative. See history of present illness  ALLERGIES: Allergies  Allergen Reactions  . Ibuprofen Other (See Comments)    Upper GI Bled    HOME MEDICATIONS: Outpatient Medications Prior to Visit  Medication Sig Dispense Refill  . [facility] B-12 500 MCG SUBL Place 1 tablet under the tongue daily with breakfast.   12  . desmopressin (DDAVP) 0.2 MG tablet Take 0.4 mg by mouth at bedtime.     [name] glimepiride (AMARYL) 2 MG tablet Take 2-4 mg by mouth 2 (two) times daily. Take 12ms in the morning and 234m at night    . losartan-hydrochlorothiazide (HYZAAR) 100-12.5 MG tablet Take 1 tablet by mouth daily.    . metFORMIN (GLUCOPHAGE-XR) 500 MG 24 hr tablet Take 500 mg by mouth 2 (two) times daily.     . ONE TOUCH ULTRA TEST test strip by Other route as needed.     . pantoprazole (PROTONIX) 40 MG tablet TAKE 1 TABLET (40 MG TOTAL) BY MOUTH DAILY BEFORE BREAKFAST. 30 tablet 3  . PRESCRIPTION MEDICATION every 8 (eight) weeks. Gets injections in right eye at dr's office every 8 weeks    . rivaroxaban (XARELTO) 10 MG TABS tablet Take 1 tablet (10 mg total) by mouth daily. 30 tablet 11  . rosuvastatin (CRESTOR) 20 MG tablet Take 20 mg by mouth at bedtime.     . Marland Kitchenestosterone cypionate (DEPOTESTOSTERONE CYPIONATE) 200 MG/ML injection Inject into the muscle every 21 ( twenty-one) days.     . Marland Kitchenobramycin (TOBREX) 0.3 % ophthalmic solution Place 1 drop into the right eye every 4 (four) hours. X 5 days (Patient taking differently: Place 1 drop into the right eye every 4  (four) hours. Use 4 times daily the 2 days before, the day of, and the 2 days after eye injections) 5 mL 0  . verapamil (CALAN-SR) 240 MG CR tablet Take 240 mg by mouth daily.    . Marland KitchenIAGRA 100 MG tablet Take 100 mg by mouth daily as needed for erectile dysfunction.   1   No facility-administered medications prior to visit.     PAST MEDICAL HISTORY: Past Medical History:  Diagnosis Date  . [place]) [place]  . GERD (gastroesophageal reflux disease)   . Hiatal hernia   . History of shingles 04/03/2014  . HTN (hypertension)   . Hyperlipidemia   . Insomnia    resolved by using CPAP  . Iron deficiency anemia due to chronic blood loss 11/20/2016  . Macular degeneration   . Nocturia more than twice per night 11/11/2014   For 6 month, 5 nocturias a night.   . Obesity   . 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 Portal vein thrombosis   . Rhinitis   . Situational depression   . Sleep apnea    uses CPAP every night  . Tubular adenoma 12/10/2015   6 cecum polyps    PAST SURGICAL HISTORY: Past Surgical History:  Procedure Laterality Date  . CARDIAC CATHETERIZATION     greater 10 yrs ago, normal  . CARPAL TUNNEL RELEASE     bilateral  . [facility]  [facility] IR GENERIC HISTORICAL  12/12/2016   IR [place] VASC ACCESS RIGHT 12/12/2016 [facility]  . IR GENERIC HISTORICAL  12/12/2016   IR VENOGRAM HEPATIC W HEMODYNAMIC EVALUATION 12/12/2016 [facility] Killings[facility]  . IR GENERIC HISTORICAL  12/12/2016   IR TRANSCATHETER BX 12/12/2016 [facility]belle Killings[facility]  . ROTATOR CUFF REPAIR     right  . TENDON REPAIR     left arm  . TONSILLECTOMY    . TOTAL KNEE ARTHROPLASTY Right 2010  .  WISDOM TOOTH EXTRACTION      FAMILY HISTORY: Family History  Problem Relation Age of Onset  . Colon cancer Unknown   . Heart failure Unknown   . Heart attack Unknown   . Lung disease Father        history  . Diabetes Mother         history  . Stroke Mother        history  . Colon polyps Neg Hx   . Rectal cancer Neg Hx   . Stomach cancer Neg Hx   . Esophageal cancer Neg Hx     SOCIAL HISTORY: Social History   Socioeconomic History  . Marital status: Married    Spouse name: Not on file  . Number of children: 1  . Years of education: Not on file  . Highest education level: Not on file  Social Needs  . Financial resource strain: Not on file  . Food insecurity - worry: Not on file  . Food insecurity - inability: Not on file  . Transportation needs - medical: Not on file  . Transportation needs - non-medical: Not on file  Occupational History  . Not on file  Tobacco Use  . Smoking status: Never Smoker  . Smokeless tobacco: Never Used  . Tobacco comment: never used tobacco  Substance and Sexual Activity  . Alcohol use: Yes    Alcohol/week: 0.0 oz    Comment: rarely   . Drug use: No  . Sexual activity: Not on file  Other Topics Concern  . Not on file  Social History Narrative   Right handed.  Caffeine 3 cups daily,  Married 2 kids (one deceased).  College grad.  [employer].       PHYSICAL EXAM  Vitals:   10/30/17 0743  BP: 119/77  Pulse: 91  Weight: 242 lb (109.8 kg)  Height: 5' 6"  (1.676 m)   Body mass index is 39.06 kg/m.  Generalized: Well developed, in no acute distress  Neck: Circumference 17 inches, Mallampati 3+  Neurological examination  Mentation: Alert oriented to time, place, history taking.  Follows all commands speech and language fluent Cranial nerve I: no changes in TASTE OR SMELL, Pupils were equal round reactive to light. Extraocular movements were full, visual field were full on confrontational test. Facial sensation and strength were normal. Uvula tongue midline. Head turning and shoulder shrug  were normal and symmetric. Motor: The motor testing reveals 5 over 5 strength of all 4 extremities,  symmetric motor tone is noted throughout.    Sensory: intact to soft touch in/on  all 4 extremities. No evidence of extinction is noted.   DIAGNOSTIC DATA (LABS, IMAGING, TESTING) - I reviewed patient records, labs, notes, testing and imaging myself where available.  Lab Results  Component Value Date   WBC 8.2 10/29/2017   HGB 12.6 (L) 10/29/2017   HCT 41.3 10/29/2017   MCV 75 (L) 10/29/2017   PLT 354 10/29/2017      Component Value Date/Time   NA 137 10/29/2017 1307   NA 134 (L) 05/10/2017 0755   K 3.6 10/29/2017 1307   K 4.2 05/10/2017 0755   CL 101 10/29/2017 1307   CO2 25 10/29/2017 1307   CO2 20 (L) 05/10/2017 0755   GLUCOSE 200 (H) 10/29/2017 1307   BUN 14 10/29/2017 1307   BUN 21.0 05/10/2017 0755   CREATININE 1.4 (H) 10/29/2017 1307   CREATININE 1.3 05/10/2017 0755   CALCIUM 10.3 10/29/2017 1307  CALCIUM 10.3 05/10/2017 0755   PROT 6.5 10/29/2017 1307   PROT 6.9 05/10/2017 0755   ALBUMIN 3.6 10/29/2017 1307   ALBUMIN 3.9 05/10/2017 0755   AST 25 10/29/2017 1307   AST 25 05/10/2017 0755   ALT 27 10/29/2017 1307   ALT 26 05/10/2017 0755   ALKPHOS 77 10/29/2017 1307   ALKPHOS 97 05/10/2017 0755   BILITOT 0.50 10/29/2017 1307   BILITOT 0.70 05/10/2017 0755   GFRNONAA >60 12/12/2016 0750   GFRAA >60 12/12/2016 0750      ASSESSMENT AND PLAN  71 y.o. year old male  has a past medical history of DM (diabetes mellitus) ([place]), GERD (gastroesophageal reflux disease), Hiatal hernia, History of shingles (04/03/2014), HTN (hypertension), Hyperlipidemia, Insomnia, Iron deficiency anemia due to chronic blood loss (11/20/2016), Macular degeneration, Nocturia more than twice per night (11/11/2014), Obesity, Polycythemia vera(238.4), Portal vein thrombosis, Rhinitis, Situational depression, Sleep apnea, and Tubular adenoma (12/10/2015). here with:  1. Obstructive sleep apnea on CPAP- 97% compliance , residual AHI 0.5 /hr.  Auto 5-12 cm water, 3 cm water EPR.    He will follow-up in one year with me,  Dr.  Brett Fairy.  2. Nocturia , completely resolved to once a night.   3. Followed for polycythemia vera, which let to portal vein thrombosis- by Dr. [name] and Dr. Havery Moros.      Larey Seat, MD  10/30/2017, 8:04 AM [facility] [address] [place], [place] [address] [phone]

## 2017-10-31 LAB — LUPUS ANTICOAGULANT PANEL
DRVVT CONFIRM: 1.8 ratio — AB (ref 0.8–1.2)
DRVVT MIX: 67.4 s — AB (ref 0.0–47.0)
DRVVT: 102.9 s — AB (ref 0.0–47.0)
PTT-LA: 39.5 s (ref 0.0–51.9)

## 2017-11-01 LAB — BETA-2-GLYCOPROTEIN I ABS, IGG/M/A
Beta-2 Glyco 1 IgA: <9 GPI IgA units (ref 0–25)
Beta-2 Glyco 1 IgM: <9 GPI IgM units (ref 0–32)

## 2017-11-01 LAB — CARDIOLIPIN ANTIBODIES, IGG, IGM, IGA: Anticardiolipin Ab,IgG,Qn: <9 GPL U/mL (ref 0–14)

## 2017-11-01 LAB — PROTEIN C, TOTAL: Protein C Antigen: 67 % (ref 60–150)

## 2017-11-02 LAB — FACTOR 5 LEIDEN

## 2017-11-05 LAB — PROTHROMBIN GENE MUTATION

## 2017-12-01 ENCOUNTER — Other Ambulatory Visit: Payer: Self-pay | Admitting: Gastroenterology

## 2017-12-03 NOTE — Telephone Encounter (Signed)
Ok to refill? thanks

## 2017-12-04 NOTE — Telephone Encounter (Signed)
Yes you can refill. #90, 3 RF

## 2018-01-18 ENCOUNTER — Telehealth: Payer: Self-pay

## 2018-01-18 ENCOUNTER — Other Ambulatory Visit: Payer: Self-pay

## 2018-01-18 DIAGNOSIS — R935 Abnormal findings on diagnostic imaging of other abdominal regions, including retroperitoneum: Secondary | ICD-10-CM

## 2018-01-18 NOTE — Telephone Encounter (Signed)
-----   Message from Doristine Counter, RN sent at 08/08/2017 10:10 AM EDT ----- Schedule 6 month repeat CT abd/pelvis w/contrast, abnormal CT, due in Feb 2019.

## 2018-01-18 NOTE — Telephone Encounter (Signed)
Ordered CT abdomen/pelvis and called patient to let him know that [facility] will contact him to schedule this 6 month follow up test. He has their phone number to contact them incase they do not call him in next day or so.

## 2018-01-28 NOTE — Telephone Encounter (Signed)
Patient did contact [facility] and is now set up for CT appointment.

## 2018-01-28 NOTE — Telephone Encounter (Signed)
Patient is wanting to follow up on CT appointment. States he has not received a call from [facility].

## 2018-02-14 ENCOUNTER — Other Ambulatory Visit: Payer: 59

## 2018-02-25 ENCOUNTER — Inpatient Hospital Stay: Payer: 59 | Attending: Hematology & Oncology | Admitting: Hematology & Oncology

## 2018-02-25 ENCOUNTER — Inpatient Hospital Stay: Payer: 59

## 2018-02-25 ENCOUNTER — Other Ambulatory Visit: Payer: Self-pay

## 2018-02-25 VITALS — BP 140/71 | HR 82 | Temp 98.3°F | Resp 16 | Wt 234.0 lb

## 2018-02-25 DIAGNOSIS — D45 Polycythemia vera: Secondary | ICD-10-CM

## 2018-02-25 DIAGNOSIS — R161 Splenomegaly, not elsewhere classified: Secondary | ICD-10-CM

## 2018-02-25 LAB — IRON AND TIBC
Iron: 24 ug/dL — ABNORMAL LOW (ref 42–163)
SATURATION RATIOS: 6 % — AB (ref 42–163)
TIBC: 403 ug/dL (ref 202–409)
UIBC: 378 ug/dL

## 2018-02-25 LAB — CMP (CANCER CENTER ONLY)
ALBUMIN: 3.9 g/dL (ref 3.5–5.0)
ALT: 21 U/L (ref 0–55)
ANION GAP: 10 (ref 3–11)
AST: 19 U/L (ref 5–34)
Alkaline Phosphatase: 85 U/L (ref 40–150)
BILIRUBIN TOTAL: 0.9 mg/dL (ref 0.2–1.2)
BUN: 15 mg/dL (ref 7–26)
CHLORIDE: 104 mmol/L (ref 98–109)
CO2: 22 mmol/L (ref 22–29)
Calcium: 10.6 mg/dL — ABNORMAL HIGH (ref 8.4–10.4)
Creatinine: 1.43 mg/dL — ABNORMAL HIGH (ref 0.70–1.30)
GFR, Est AFR Am: 55 mL/min — ABNORMAL LOW (ref >60)
GFR, Estimated: 47 mL/min — ABNORMAL LOW (ref >60)
GLUCOSE: 187 mg/dL — AB (ref 70–140)
POTASSIUM: 4 mmol/L (ref 3.5–5.1)
SODIUM: 136 mmol/L (ref 136–145)
Total Protein: 7 g/dL (ref 6.4–8.3)

## 2018-02-25 LAB — CBC WITH DIFFERENTIAL (CANCER CENTER ONLY)
BASOS ABS: 0 10*3/uL (ref 0.0–0.1)
Basophils Relative: 0 %
EOS PCT: 1 %
Eosinophils Absolute: 0.1 10*3/uL (ref 0.0–0.5)
HCT: 46.6 % (ref 38.7–49.9)
Hemoglobin: 13.9 g/dL (ref 13.0–17.1)
LYMPHS PCT: 11 %
Lymphs Abs: 0.8 10*3/uL — ABNORMAL LOW (ref 0.9–3.3)
MCH: 20.1 pg — ABNORMAL LOW (ref 28.0–33.4)
MCHC: 29.8 g/dL — ABNORMAL LOW (ref 32.0–35.9)
MCV: 67.3 fL — AB (ref 82.0–98.0)
Monocytes Absolute: 1 10*3/uL — ABNORMAL HIGH (ref 0.1–0.9)
Monocytes Relative: 14 %
NEUTROS ABS: 5.4 10*3/uL (ref 1.5–6.5)
Neutrophils Relative %: 74 %
Platelet Count: 309 10*3/uL (ref 145–400)
RBC: 6.92 MIL/uL — AB (ref 4.20–5.70)
RDW: 24 % — ABNORMAL HIGH (ref 11.1–15.7)
WBC: 7.4 10*3/uL (ref 4.0–10.0)

## 2018-02-25 LAB — FERRITIN: Ferritin: 13 ng/mL — ABNORMAL LOW (ref 22–316)

## 2018-02-25 NOTE — Progress Notes (Signed)
Hematology and Oncology Follow Up Visit  Axton Cihlar [id number] [birth year] 72 y.o. 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 IV iron as indicated - last received in Decemeber 2017 x 2     Interim History:  Mr. Gallentine is here today for a follow-up.  He is doing okay.  He got through all the holidays without any difficulty.  He got through the bad snow in December.  He has a CT scan scheduled for next week.  This is ordered by his gastroenterologist.  He is had no bleeding.  He is on blood thinner.  He is on low-dose Xarelto.  He has had no change in bowel or bladder habits.  He has had no melena or bright red blood per rectum.    Is had no fever.  He has had no cough or shortness of breath.  There has been no leg swelling.  He has had no rashes.  Overall, his performance status is ECOG 1.  Medications:  Allergies as of 02/25/2018      Reactions   Ibuprofen Other (See Comments)   Upper GI Bled   Nsaids       Medication List        Accurate as of 02/25/18  8:23 AM. Always use your most recent med list.          benzonatate 100 MG capsule Commonly known as:  TESSALON Take 100 mg by mouth as needed.   [facility] B-12 500 MCG Subl Generic drug:  Cyanocobalamin Place 1 tablet under the tongue daily with breakfast.   desmopressin 0.2 MG tablet Commonly known as:  DDAVP Take 0.4 mg by mouth at bedtime.   gatifloxacin 0.5 % Soln Commonly known as:  ZYMAXID   glimepiride 2 MG tablet Commonly known as:  AMARYL Take 2-4 mg by mouth 2 (two) times daily. Take 62ms in the morning and 248m at night   losartan-hydrochlorothiazide 100-12.5 MG tablet Commonly known as:  HYZAAR Take 1 tablet by mouth daily.   metFORMIN 500 MG 24 hr tablet Commonly known as:  GLUCOPHAGE-XR Take 500 mg by mouth 2 (two) times  daily.   moxifloxacin 0.5 % ophthalmic solution Commonly known as:  VIGAMOX PLACE 1 DROP IN RIGHT EYE FOUR TIMES A DAY USE THE DAY BEFORE, DAY OF, AND DAY AFTER TREATMENT.   ONE TOUCH ULTRA TEST test strip Generic drug:  glucose blood by Other route as needed.   pantoprazole 40 MG tablet Commonly known as:  PROTONIX TAKE 1 TABLET (40 MG TOTAL) BY MOUTH DAILY BEFORE BREAKFAST.   PRESCRIPTION MEDICATION every 8 (eight) weeks. Gets injections in right eye at dr's office every 8 weeks   PROLENSA 0.07 % Soln Generic drug:  Bromfenac Sodium PLACE 1 DROP INTO THE LEFT EYE ONCE DAILY. BEGIN 2 DAYS BEFORE SURGERY.   rivaroxaban 10 MG Tabs tablet Commonly known as:  XARELTO Take 1 tablet (10 mg total) by mouth daily.   rosuvastatin 20 MG tablet Commonly known as:  CRESTOR Take 20 mg by mouth at bedtime.   testosterone cypionate 200 MG/ML injection Commonly known as:  DEPOTESTOSTERONE CYPIONATE Inject into the muscle every 21 ( twenty-one) days.   tobramycin 0.3 % ophthalmic solution Commonly known as:  TOBREX Place 1 drop into the right eye every 4 (four) hours. X 5 days   verapamil  240 MG CR tablet Commonly known as:  CALAN-SR Take 240 mg by mouth daily.   VIAGRA 100 MG tablet Generic drug:  sildenafil Take 100 mg by mouth daily as needed for erectile dysfunction.       Allergies:  Allergies  Allergen Reactions  . Ibuprofen Other (See Comments)    Upper GI Bled  . Nsaids     Past Medical History, Surgical history, Social history, and Family History were reviewed and updated.  Review of Systems: Review of Systems  Constitutional: Negative.   HENT: Negative.   Eyes: Negative.   Respiratory: Negative.   Cardiovascular: Negative.   Gastrointestinal: Negative.   Genitourinary: Negative.   Musculoskeletal: Negative.   Skin: Negative.   Neurological: Negative.   Endo/Heme/Allergies: Negative.   Psychiatric/Behavioral: Negative.     Physical Exam:  weight is  234 lb (106.1 kg). His oral temperature is 98.3 F (36.8 C). His blood pressure is 140/71 and his pulse is 82. His respiration is 16 and oxygen saturation is 94%.   Wt Readings from Last 3 Encounters:  02/25/18 234 lb (106.1 kg)  10/30/17 242 lb (109.8 kg)  10/29/17 243 lb (110.2 kg)    Physical Exam  Constitutional: He is oriented to person, place, and time.  HENT:  Head: Normocephalic and atraumatic.  Mouth/Throat: Oropharynx is clear and moist.  Eyes: EOM are normal. Pupils are equal, round, and reactive to light.  Neck: Normal range of motion.  Cardiovascular: Normal rate, regular rhythm and normal heart sounds.  Pulmonary/Chest: Effort normal and breath sounds normal.  Abdominal: Soft. Bowel sounds are normal.  Musculoskeletal: Normal range of motion. He exhibits no edema, tenderness or deformity.  Lymphadenopathy:    He has no cervical adenopathy.  Neurological: He is alert and oriented to person, place, and time.  Skin: Skin is warm and dry. No rash noted. No erythema.  Psychiatric: He has a normal mood and affect. His behavior is normal. Judgment and thought content normal.  Vitals reviewed.    Lab Results  Component Value Date   WBC 7.4 02/25/2018   HGB 12.6 (L) 10/29/2017   HCT 46.6 02/25/2018   MCV 67.3 (L) 02/25/2018   PLT 309 02/25/2018   Lab Results  Component Value Date   FERRITIN 29 08/23/2017   IRON 33 (L) 08/23/2017   TIBC 394 08/23/2017   UIBC 361 08/23/2017   IRONPCTSAT 8 (L) 08/23/2017   Lab Results  Component Value Date   RETICCTPCT 1.7 08/30/2011   RBC 6.92 (H) 02/25/2018   RETICCTABS 94.0 08/30/2011   No results found for: Nils Pyle [facility] Lab Results  Component Value Date   IGGSERUM 828 10/11/2016   IGA 177 06/22/2016   No results found for: Odetta Pink, SPEI   Chemistry      Component Value Date/Time   NA 137 10/29/2017 1307   NA 134 (L) 05/10/2017 0755     K 3.6 10/29/2017 1307   K 4.2 05/10/2017 0755   CL 101 10/29/2017 1307   CO2 25 10/29/2017 1307   CO2 20 (L) 05/10/2017 0755   BUN 14 10/29/2017 1307   BUN 21.0 05/10/2017 0755   CREATININE 1.4 (H) 10/29/2017 1307   CREATININE 1.3 05/10/2017 0755      Component Value Date/Time   CALCIUM 10.3 10/29/2017 1307   CALCIUM 10.3 05/10/2017 0755   ALKPHOS 77 10/29/2017 1307   ALKPHOS 97 05/10/2017 0755   AST 25 10/29/2017 1307  AST 25 05/10/2017 0755   ALT 27 10/29/2017 1307   ALT 26 05/10/2017 0755   BILITOT 0.50 10/29/2017 1307   BILITOT 0.70 05/10/2017 0755     Impression and Plan: Mr. Cauthon is 72 year old gentleman with polycythemia. He is JAK2 negative.  I'm really not all that worried about the splenomegaly. I reassured him about this.  We will have to see what his CT scan shows.  I cannot imagine that this will cause any problems.  I really do not think he needs to be phlebotomized.  His hematocrit is a little above 45%.  However, I just do not think we have to get blood off of him.    See me in another 3 months.     [name], MD 3/11/20198:23 AM

## 2018-02-26 ENCOUNTER — Telehealth: Payer: Self-pay

## 2018-02-26 NOTE — Telephone Encounter (Addendum)
-----   Message from [name], MD sent at 02/25/2018  2:02 PM EDT ----- Call - iron is low, but not too bad!!  Laurey Arrow  Above message left on personalized VM with instructions to contact our office with questions/concerns. dph

## 2018-02-27 ENCOUNTER — Ambulatory Visit
Admission: RE | Admit: 2018-02-27 | Discharge: 2018-02-27 | Disposition: A | Discharge status: Home / Self Care | Payer: 59 | Source: Ambulatory Visit | Attending: Gastroenterology | Admitting: Gastroenterology

## 2018-02-27 ENCOUNTER — Telehealth: Payer: Self-pay | Admitting: *Deleted

## 2018-02-27 DIAGNOSIS — R935 Abnormal findings on diagnostic imaging of other abdominal regions, including retroperitoneum: Secondary | ICD-10-CM

## 2018-02-27 MED ORDER — IOPAMIDOL (ISOVUE-300) INJECTION 61%
125.0000 mL | Freq: Once | INTRAVENOUS | Status: AC | PRN
  Administered 2018-02-27: 125 mL via INTRAVENOUS

## 2018-02-27 NOTE — Telephone Encounter (Signed)
Patient received message from Dr [name] that while his iron was low, intervention wasn't needed. Patient states he is feeling poorly and wants to know what his options for treatment are.  Spoke to Dr [name] about patient not feeling well, and he states we can give iron to treat his symptoms.   Returned patient call. Patient will think about it, look at his schedule and call back.

## 2018-02-28 ENCOUNTER — Telehealth: Payer: Self-pay | Admitting: Gastroenterology

## 2018-05-09 ENCOUNTER — Encounter: Payer: Self-pay | Admitting: Gastroenterology

## 2018-05-09 ENCOUNTER — Ambulatory Visit: Payer: 59 | Admitting: Gastroenterology

## 2018-05-09 ENCOUNTER — Encounter

## 2018-05-09 VITALS — BP 132/80 | HR 74 | Ht 66.0 in | Wt 218.1 lb

## 2018-05-09 DIAGNOSIS — D509 Iron deficiency anemia, unspecified: Secondary | ICD-10-CM

## 2018-05-09 DIAGNOSIS — Z86010 Personal history of colonic polyps: Secondary | ICD-10-CM

## 2018-05-09 DIAGNOSIS — I81 Portal vein thrombosis: Secondary | ICD-10-CM

## 2018-05-09 DIAGNOSIS — K760 Fatty (change of) liver, not elsewhere classified: Secondary | ICD-10-CM

## 2018-05-09 MED ORDER — POLYETHYLENE GLYCOL 3350 17 GM/SCOOP PO POWD: ORAL | 3 refills | Status: DC

## 2018-05-09 NOTE — Patient Instructions (Signed)
If you are age 73 or older, your body mass index should be between 23-30. Your Body mass index is 35.21 kg/m. If this is out of the aforementioned range listed, please consider follow up with your Primary Care Provider.  If you are age 50 or younger, your body mass index should be between 19-25. Your Body mass index is 35.21 kg/m. If this is out of the aformentioned range listed, please consider follow up with your Primary Care Provider.   Please stop taking Reglan.  Please purchase the following medications over the counter and take as directed: Miralax: take once to twice a day as needed for constipation  You will be due for a recall colonoscopy in December 2019. We will send you a reminder in the mail when it gets closer to that time.  Thank you for entrusting me with your care and for choosing [facility], Dr. Hillside Cellar

## 2018-05-09 NOTE — Progress Notes (Signed)
HPI :  72 year old male here for a follow-up visit.   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 He was started on anticoagulation with Xarelto. Incidentally noted at that time on imaging or changes concerning for underlying cirrhosis of the liver. Underwent ultrasound elastography showing F3/4 fibrosis and possible changes of cirrhosis. Along with this was anindeterminate liver lesion, for which the patient had a follow-up MRI of the liver which showed this appeared to be a benign cystic lesion. AFP was normal at the time. The patient also had some labs done to evaluate for chronic liver diseases which appeared negative. He was admitted for possible GI bleed, had an EGD done on 09/28/16 - multiple erosions / gastritis, benign fundic gland polyps, no varices noted. Clo test negative for H pylori. It was thought perhaps he had gastritis related to NSAID  He had negative labs for chronic liver diseases negative including labs for hemochromatosis. Transjugular liver biopsy with portal pressures obtained. He has a gradient of 6 which barely met criteria for portal hypertension. Fortunately his liver biopsy does not show any evidence of cirrhosis, he has only some mild steatosis and is otherwise normal biopsy. Follow up MRI liver on 01/18/17 showed stable hepatic cyst, morphologic changes of mild cirrhosis. He had IV Iron infusion. Hgb normalized. Capsule endoscopy was performed and generally was a good prep with good views. 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   CT scan on 08/01/17 showed focal 3cm area of the central omentum, thought to be related to omental infarct. He had collateralization around the SMV indicating prior chronic thrombus of the SMV. Also with  interval evolution of splenic infarcts and chronic occulusion of the splenic vein. He has remained on Xarelto and following with Dr. [name].  Since I have last seen him he had another CT scan in March 2019 per radiology recommendations. CT showed Decreased in focal omental soft tissue density, most consistent with chronic omental infarct. Expected evolution of chronic splenic infarcts. 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 Hepatic steatosis.  He states he is not having any abdominal pains which are bothering him. His bowels are okay, not as regular as they were previously, using prune juice as needed for some constipation having some straining periodically. He has made lifestyle changes and is lost some weight, he's been trying to lose weight and lost about 25 pounds over the past several months. Most recent issues bothering him or problems with anxiety and depression which he is dealing with. He has been on recent medication changes to help deal with this. Of note he does take Reglan and is not having much nausea or vomiting.  Endoscopic history: Colonoscopy 12/10/15: 6 small cecal polyps, moderate diverticulosis in the sigmoid colon and internal hemorrhoids. Pathology revealed tubular adenomas    Past Medical History:  Diagnosis Date  . Anxiety   . Depression   . DM (diabetes mellitus) ([place])   . GERD (gastroesophageal reflux disease)   . Hiatal hernia   . History of shingles 04/03/2014  . HTN (hypertension)   . Hyperlipidemia   . Insomnia    resolved by using CPAP  . Iron deficiency anemia due to chronic blood loss 11/20/2016  . Macular degeneration   . Nocturia more than twice  per night 11/11/2014   For 6 month, 5 nocturias a night.   . Obesity   . 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 Portal vein thrombosis   . Rhinitis   . Situational depression   . Sleep apnea    uses CPAP every night  . Tubular adenoma 12/10/2015   6 cecum polyps     Past  Surgical History:  Procedure Laterality Date  . CARDIAC CATHETERIZATION     greater 10 yrs ago, normal  . CARPAL TUNNEL RELEASE     bilateral  . COLONOSCOPY  06/2005   [facility] Dr Lajoyce Corners  . ESOPHAGOGASTRODUODENOSCOPY (EGD) WITH PROPOFOL N/A 09/28/2016   Procedure: ESOPHAGOGASTRODUODENOSCOPY (EGD) WITH PROPOFOL;  Surgeon: Gatha Mayer, MD;  Location: [facility];  Service: Endoscopy;  Laterality: N/A;  . IR GENERIC HISTORICAL  12/12/2016   IR US GUIDE VASC ACCESS RIGHT 12/12/2016 [name], MD [facility]  . IR GENERIC HISTORICAL  12/12/2016   IR VENOGRAM HEPATIC W HEMODYNAMIC EVALUATION 12/12/2016 [name], MD [facility]  . IR GENERIC HISTORICAL  12/12/2016   IR TRANSCATHETER BX 12/12/2016 [name], MD [facility]  . ROTATOR CUFF REPAIR     right  . TENDON REPAIR     left arm  . TONSILLECTOMY    . TOTAL KNEE ARTHROPLASTY Right 2010  . WISDOM TOOTH EXTRACTION     Family History  Problem Relation Age of Onset  . Colon cancer Unknown   . Heart failure Unknown   . Heart attack Unknown   . Lung disease Father        history  . Diabetes Mother        history  . Stroke Mother        history  . Colon polyps Neg Hx   . Rectal cancer Neg Hx   . Stomach cancer Neg Hx   . Esophageal cancer Neg Hx    Social History   Tobacco Use  . Smoking status: Never Smoker  . Smokeless tobacco: Never Used  . Tobacco comment: never used tobacco  Substance Use Topics  . Alcohol use: Yes    Alcohol/week: 0.0 oz    Comment: rarely   . Drug use: No   Current Outpatient Medications  Medication Sig Dispense Refill  . acetaminophen (TYLENOL) 500 MG tablet Take 500 mg by mouth every 6 (six) hours as needed.    [name] AMLODIPINE BESYLATE PO Take 5 mg by mouth daily.    . ARIPiprazole (ABILIFY) 5 MG tablet Take 5 mg by mouth daily.    . colchicine 0.6 MG tablet Take 0.6 mg by mouth daily.    . [employer] B-12 500 MCG SUBL Place 1 tablet under the tongue daily with breakfast.   12  .  desmopressin (DDAVP) 0.2 MG tablet Take 0.4 mg by mouth at bedtime.     [name] glimepiride (AMARYL) 2 MG tablet Take 2-4 mg by mouth 2 (two) times daily. Take 38ms in the morning and 272m at night    . losartan-hydrochlorothiazide (HYZAAR) 100-12.5 MG tablet Take 1 tablet by mouth daily.    . magnesium hydroxide (MILK OF MAGNESIA) 400 MG/5ML suspension Take by mouth daily as needed for mild constipation.    . metFORMIN (GLUCOPHAGE-XR) 500 MG 24 hr tablet Take 500 mg by mouth 2 (two) times daily.     . Marland Kitchenoxifloxacin (VIGAMOX) 0.5 % ophthalmic solution PLACE 1 DROP IN RIGHT EYE FOUR TIMES A DAY USE THE DAY BEFORE, DAY OF,  AND DAY AFTER TREATMENT.  5  . ONE TOUCH ULTRA TEST test strip by Other route as needed.     . pantoprazole (PROTONIX) 40 MG tablet TAKE 1 TABLET (40 MG TOTAL) BY MOUTH DAILY BEFORE BREAKFAST. 90 tablet 3  . PARoxetine (PAXIL) 20 MG tablet Take 20 mg by mouth daily.    [name] PRESCRIPTION MEDICATION every 8 (eight) weeks. Gets injections in right eye at dr's office every 8 weeks    . PROLENSA 0.07 % SOLN PLACE 1 DROP INTO THE LEFT EYE ONCE DAILY. BEGIN 2 DAYS BEFORE SURGERY.  0  . propranolol (INDERAL) 10 MG tablet Take 10 mg by mouth 2 (two) times daily.    . rivaroxaban (XARELTO) 10 MG TABS tablet Take 1 tablet (10 mg total) by mouth daily. 30 tablet 11  . rosuvastatin (CRESTOR) 20 MG tablet Take 20 mg by mouth at bedtime.     [name] testosterone cypionate (DEPOTESTOSTERONE CYPIONATE) 200 MG/ML injection Inject into the muscle every 21 ( twenty-one) days.     [name] tobramycin (TOBREX) 0.3 % ophthalmic solution Place 1 drop into the right eye every 4 (four) hours. X 5 days (Patient taking differently: Place 1 drop into the right eye every 4 (four) hours. Use 4 times daily the 2 days before, the day of, and the 2 days after eye injections) 5 mL 0  . verapamil (CALAN-SR) 240 MG CR tablet Take 240 mg by mouth daily.    [name] VIAGRA 100 MG tablet Take 100 mg by mouth daily as needed for erectile dysfunction.    1  . zolpidem (AMBIEN) 10 MG tablet Take 10 mg by mouth at bedtime as needed for sleep.    . polyethylene glycol powder (GLYCOLAX/MIRALAX) powder Take once to twice a day as needed for constipation 255 g 3   No current facility-administered medications for this visit.    Allergies  Allergen Reactions  . Ibuprofen Other (See Comments)    Upper GI Bled  . Nsaids      Review of Systems: All systems reviewed and negative except where noted in HPI.   Lab Results  Component Value Date   WBC 7.4 02/25/2018   HGB 13.9 02/25/2018   HCT 46.6 02/25/2018   MCV 67.3 (L) 02/25/2018   PLT 309 02/25/2018    Lab Results  Component Value Date   CREATININE 1.43 (H) 02/25/2018   BUN 15 02/25/2018   NA 136 02/25/2018   K 4.0 02/25/2018   CL 104 02/25/2018   CO2 22 02/25/2018    Lab Results  Component Value Date   ALT 21 02/25/2018   AST 19 02/25/2018   ALKPHOS 85 02/25/2018   BILITOT 0.9 02/25/2018    Lab Results  Component Value Date   IRON 24 (L) 02/25/2018   TIBC 403 02/25/2018   FERRITIN 13 (L) 02/25/2018     Physical Exam: BP 132/80   Pulse 74   Ht 5' 6"  (1.676 m)   Wt 218 lb 2 oz (98.9 kg)   BMI 35.21 kg/m  Constitutional: Pleasant,well-developed, male in no acute distress. HEENT: Normocephalic and atraumatic. Conjunctivae are normal. No scleral icterus. Neck supple.  Cardiovascular: Normal rate, regular rhythm.  Pulmonary/chest: Effort normal and breath sounds normal. No wheezing, rales or rhonchi. Abdominal: Soft, nondistended, nontender. There are no masses palpable. No hepatomegaly. Extremities: no edema Lymphadenopathy: No cervical adenopathy noted. Neurological: Alert and oriented to person place and time. Skin: Skin is warm and dry. No rashes noted. Psychiatric: Normal  mood and affect. Behavior is normal.   ASSESSMENT AND PLAN: 72 year old male here for reassessment of the following issues:  Portal / splenic vein / SMV thrombosis / omental infarcts -  thought to be related to polycythemia, on anticoagulation with his relative per Dr. [name]. Follow-up CT scan since the last one was done and shows decrease in chronic omental infarcts, expected evolution of chronic splenic infarcts. 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 He continue anticoagulation at this time. Defer management of polycythemia to Dr. [name]. He is stable at this time, I don't think he warrants any further imaging for this issue at this time. I will see him in 6 months for follow up.   Abnormal imaging of the liver / fatty liver - history of fatty liver but no evidence of cirrhosis based on prior liver biopsy. He has been trying to lose weight through lifestyle changes and doing a good job with this. Will continue to monitor LFTs.  History of iron deficiency anemia / GI bleed - resolved, occured the setting of anticoagulation previously, perhaps due to gastritis and/or AVMs on capsule study. CBC stable at this time, will monitor over time.  History of colon adenomas - due for surveillance colonoscopy in December 2019. He will need to hold anticoagulation for that procedure, we'll reassess him in clinic prior to that appointment.  Of note, I don't see why he needs Reglan at this time, we'll stop it. If symptoms recur in contact me.  Waterville Cellar, MD [facility]

## 2018-05-27 ENCOUNTER — Inpatient Hospital Stay: Payer: 59 | Attending: Hematology & Oncology | Admitting: Hematology & Oncology

## 2018-05-27 ENCOUNTER — Inpatient Hospital Stay: Payer: 59

## 2018-05-27 ENCOUNTER — Other Ambulatory Visit: Payer: Self-pay

## 2018-05-27 ENCOUNTER — Encounter: Payer: Self-pay | Admitting: Hematology & Oncology

## 2018-05-27 VITALS — BP 144/79 | HR 88 | Temp 97.7°F | Resp 18 | Wt 215.0 lb

## 2018-05-27 DIAGNOSIS — Z7901 Long term (current) use of anticoagulants: Secondary | ICD-10-CM | POA: Diagnosis not present

## 2018-05-27 DIAGNOSIS — F418 Other specified anxiety disorders: Secondary | ICD-10-CM | POA: Diagnosis not present

## 2018-05-27 DIAGNOSIS — D45 Polycythemia vera: Secondary | ICD-10-CM

## 2018-05-27 DIAGNOSIS — D735 Infarction of spleen: Secondary | ICD-10-CM

## 2018-05-27 DIAGNOSIS — E611 Iron deficiency: Secondary | ICD-10-CM | POA: Diagnosis not present

## 2018-05-27 DIAGNOSIS — I81 Portal vein thrombosis: Secondary | ICD-10-CM

## 2018-05-27 LAB — CMP (CANCER CENTER ONLY)
ALBUMIN: 4.1 g/dL (ref 3.5–5.0)
ALT: 23 U/L (ref 10–47)
AST: 23 U/L (ref 11–38)
Alkaline Phosphatase: 89 U/L — ABNORMAL HIGH (ref 26–84)
Anion gap: 10 (ref 5–15)
BUN: 12 mg/dL (ref 7–22)
CO2: 26 mmol/L (ref 18–33)
CREATININE: 1.4 mg/dL — AB (ref 0.60–1.20)
Calcium: 10.5 mg/dL — ABNORMAL HIGH (ref 8.0–10.3)
Chloride: 106 mmol/L (ref 98–108)
GLUCOSE: 188 mg/dL — AB (ref 73–118)
Potassium: 3.9 mmol/L (ref 3.3–4.7)
SODIUM: 142 mmol/L (ref 128–145)
Total Bilirubin: 0.9 mg/dL (ref 0.2–1.6)
Total Protein: 7.1 g/dL (ref 6.4–8.1)

## 2018-05-27 LAB — CBC WITH DIFFERENTIAL/PLATELET
BASOS ABS: 0 10*3/uL (ref 0.0–0.1)
Basophils Relative: 0 %
Eosinophils Absolute: 0.2 10*3/uL (ref 0.0–0.7)
Eosinophils Relative: 2 %
HEMATOCRIT: 47.5 % (ref 39.0–52.0)
Hemoglobin: 14.7 g/dL (ref 13.0–17.0)
LYMPHS ABS: 0.8 10*3/uL (ref 0.7–4.0)
LYMPHS PCT: 10 %
MCH: 20.5 pg — ABNORMAL LOW (ref 26.0–34.0)
MCHC: 30.9 g/dL (ref 30.0–36.0)
MCV: 66.3 fL — ABNORMAL LOW (ref 78.0–100.0)
MONOS PCT: 17 %
Monocytes Absolute: 1.4 10*3/uL — ABNORMAL HIGH (ref 0.1–1.0)
NEUTROS PCT: 71 %
Neutro Abs: 5.8 10*3/uL (ref 1.7–7.7)
Platelets: 315 10*3/uL (ref 150–400)
RBC: 7.16 MIL/uL — AB (ref 4.22–5.81)
RDW: 23.2 % — AB (ref 11.5–15.5)
WBC: 8.2 10*3/uL (ref 4.0–10.5)

## 2018-05-27 LAB — IRON AND TIBC
IRON: 23 ug/dL — AB (ref 42–163)
Saturation Ratios: 5 % — ABNORMAL LOW (ref 42–163)
TIBC: 436 ug/dL — ABNORMAL HIGH (ref 202–409)
UIBC: 413 ug/dL

## 2018-05-27 LAB — FERRITIN: Ferritin: 9 ng/mL — ABNORMAL LOW (ref 22–316)

## 2018-05-27 NOTE — Progress Notes (Signed)
Hematology and Oncology Follow Up Visit  Yosiel Thieme [id number] [birth year] 72 y.o. 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 IV iron as indicated - last received in Decemeber 2017 x 2     Interim History:  Mr. Jarnigan is here today for a follow-up.  Unfortunately, he is now dealing with a combination of anxiety and depression.  This is been going on for couple months.  He takes Ambien.  He is on Wellbutrin.  He is also on some Ativan.  I told him that exercise will certainly help make him feel better.  Hopefully he will be able to swim this summer.  Otherwise, he seems to be doing okay.  He is still working.  His iron studies showed iron deficiency.  Back in March, his ferritin was 13 with iron saturation of 6%.  He has had no change in bowel or bladder habits.  He has had no abdominal pain.  He has had no nausea or vomiting.  He has had no leg swelling.  He has had no rashes.  He did have a CT scan of the abdomen back in March.  This showed decrease in omental soft tissue density.  He has evolution of chronic splenic infarcts.  He has stable splenic and superior mesenteric vein thrombus.  He is on Xarelto.  He is doing well with Xarelto.  Overall, his performance status is ECOG 1.  Medications:  Allergies as of 05/27/2018      Reactions   Ibuprofen Other (See Comments)   Upper GI Bled   Nsaids       Medication List        Accurate as of 05/27/18  8:11 AM. Always use your most recent med list.          acetaminophen 500 MG tablet Commonly known as:  TYLENOL Take 500 mg by mouth every 6 (six) hours as needed.   AMLODIPINE BESYLATE PO Take 5 mg by mouth daily.   ARIPiprazole 5 MG tablet Commonly known as:  ABILIFY Take 5 mg by mouth daily.   colchicine 0.6 MG tablet Take 0.6 mg by mouth daily.   [employer]S B-12 500 MCG Subl Generic drug:  Cyanocobalamin Place 1 tablet under the tongue daily with breakfast.   desmopressin 0.2 MG tablet Commonly known as:  DDAVP Take 0.4 mg by mouth at bedtime.   glimepiride 2 MG tablet Commonly known as:  AMARYL Take 2-4 mg by mouth 2 (two) times daily. Take 38ms in the morning and 237m at night   losartan-hydrochlorothiazide 100-12.5 MG tablet Commonly known as:  HYZAAR Take 1 tablet by mouth daily.   magnesium hydroxide 400 MG/5ML suspension Commonly known as:  MILK OF MAGNESIA Take by mouth daily as needed for mild constipation.   metFORMIN 500 MG 24 hr tablet Commonly known as:  GLUCOPHAGE-XR Take 500 mg by mouth 2 (two) times daily.   moxifloxacin 0.5 % ophthalmic solution Commonly known as:  VIGAMOX PLACE 1 DROP IN RIGHT EYE FOUR TIMES A DAY USE THE DAY BEFORE, DAY OF, AND DAY AFTER TREATMENT.   ONE TOUCH ULTRA TEST test strip Generic drug:  glucose blood by Other route as needed.   pantoprazole 40 MG tablet Commonly known as:  PROTONIX TAKE 1 TABLET (40 MG TOTAL) BY MOUTH DAILY BEFORE BREAKFAST.   PARoxetine 20 MG tablet  Commonly known as:  PAXIL Take 20 mg by mouth daily.   polyethylene glycol powder powder Commonly known as:  GLYCOLAX/MIRALAX Take once to twice a day as needed for constipation   PRESCRIPTION MEDICATION every 8 (eight) weeks. Gets injections in right eye at dr's office every 8 weeks   PROLENSA 0.07 % Soln Generic drug:  Bromfenac Sodium PLACE 1 DROP INTO THE LEFT EYE ONCE DAILY. BEGIN 2 DAYS BEFORE SURGERY.   propranolol 10 MG tablet Commonly known as:  INDERAL Take 10 mg by mouth 2 (two) times daily.   rivaroxaban 10 MG Tabs tablet Commonly known as:  XARELTO Take 1 tablet (10 mg total) by mouth daily.   rosuvastatin 20 MG tablet Commonly known as:  CRESTOR Take 20 mg by mouth at bedtime.   testosterone cypionate 200 MG/ML injection Commonly known as:  DEPOTESTOSTERONE CYPIONATE Inject into  the muscle every 21 ( twenty-one) days.   tobramycin 0.3 % ophthalmic solution Commonly known as:  TOBREX Place 1 drop into the right eye every 4 (four) hours. X 5 days   verapamil 240 MG CR tablet Commonly known as:  CALAN-SR Take 240 mg by mouth daily.   VIAGRA 100 MG tablet Generic drug:  sildenafil Take 100 mg by mouth daily as needed for erectile dysfunction.   zolpidem 10 MG tablet Commonly known as:  AMBIEN Take 10 mg by mouth at bedtime as needed for sleep.       Allergies:  Allergies  Allergen Reactions  . Ibuprofen Other (See Comments)    Upper GI Bled  . Nsaids     Past Medical History, Surgical history, Social history, and Family History were reviewed and updated.  Review of Systems: Review of Systems  Constitutional: Negative.   HENT: Negative.   Eyes: Negative.   Respiratory: Negative.   Cardiovascular: Negative.   Gastrointestinal: Negative.   Genitourinary: Negative.   Musculoskeletal: Negative.   Skin: Negative.   Neurological: Negative.   Endo/Heme/Allergies: Negative.   Psychiatric/Behavioral: Negative.     Physical Exam:  vitals were not taken for this visit.   Wt Readings from Last 3 Encounters:  05/09/18 218 lb 2 oz (98.9 kg)  02/25/18 234 lb (106.1 kg)  10/30/17 242 lb (109.8 kg)    Physical Exam  Constitutional: He is oriented to person, place, and time.  HENT:  Head: Normocephalic and atraumatic.  Mouth/Throat: Oropharynx is clear and moist.  Eyes: Pupils are equal, round, and reactive to light. EOM are normal.  Neck: Normal range of motion.  Cardiovascular: Normal rate, regular rhythm and normal heart sounds.  Pulmonary/Chest: Effort normal and breath sounds normal.  Abdominal: Soft. Bowel sounds are normal.  Musculoskeletal: Normal range of motion. He exhibits no edema, tenderness or deformity.  Lymphadenopathy:    He has no cervical adenopathy.  Neurological: He is alert and oriented to person, place, and time.  Skin:  Skin is warm and dry. No rash noted. No erythema.  Psychiatric: He has a normal mood and affect. His behavior is normal. Judgment and thought content normal.  Vitals reviewed.    Lab Results  Component Value Date   WBC 7.4 02/25/2018   HGB 13.9 02/25/2018   HCT 46.6 02/25/2018   MCV 67.3 (L) 02/25/2018   PLT 309 02/25/2018   Lab Results  Component Value Date   FERRITIN 13 (L) 02/25/2018   IRON 24 (L) 02/25/2018   TIBC 403 02/25/2018   UIBC 378 02/25/2018   IRONPCTSAT 6 (L) 02/25/2018  Lab Results  Component Value Date   RETICCTPCT 1.7 08/30/2011   RBC 6.92 (H) 02/25/2018   RETICCTABS 94.0 08/30/2011   No results found for: Nils Pyle [facility] Lab Results  Component Value Date   IGGSERUM 828 10/11/2016   IGA 177 06/22/2016   No results found for: Odetta Pink, SPEI   Chemistry      Component Value Date/Time   NA 136 02/25/2018 0739   NA 137 10/29/2017 1307   NA 134 (L) 05/10/2017 0755   K 4.0 02/25/2018 0739   K 3.6 10/29/2017 1307   K 4.2 05/10/2017 0755   CL 104 02/25/2018 0739   CL 101 10/29/2017 1307   CO2 22 02/25/2018 0739   CO2 25 10/29/2017 1307   CO2 20 (L) 05/10/2017 0755   BUN 15 02/25/2018 0739   BUN 14 10/29/2017 1307   BUN 21.0 05/10/2017 0755   CREATININE 1.43 (H) 02/25/2018 0739   CREATININE 1.4 (H) 10/29/2017 1307   CREATININE 1.3 05/10/2017 0755      Component Value Date/Time   CALCIUM 10.6 (H) 02/25/2018 0739   CALCIUM 10.3 10/29/2017 1307   CALCIUM 10.3 05/10/2017 0755   ALKPHOS 85 02/25/2018 0739   ALKPHOS 77 10/29/2017 1307   ALKPHOS 97 05/10/2017 0755   AST 19 02/25/2018 0739   AST 25 05/10/2017 0755   ALT 21 02/25/2018 0739   ALT 27 10/29/2017 1307   ALT 26 05/10/2017 0755   BILITOT 0.9 02/25/2018 0739   BILITOT 0.70 05/10/2017 0755     Impression and Plan: Mr. Soltys is 71 year old gentleman with polycythemia. He is JAK2 negative.  Unfortunately,  our machines are down right now.  I do not know what his CBC is.  I will get him back in 3 months.  We can always phlebotomize him if his hemoglobin is too high.  We will plan to see him back in 3 months.   [name], MD 6/10/20198:11 AM

## 2018-05-28 ENCOUNTER — Inpatient Hospital Stay: Payer: 59

## 2018-05-28 DIAGNOSIS — D45 Polycythemia vera: Secondary | ICD-10-CM | POA: Diagnosis not present

## 2018-05-28 NOTE — Progress Notes (Signed)
James [name]. presents today for phlebotomy per MD orders. Phlebotomy procedure started at 1545 and ended at 1550. 540 grams removed via phlebotomy kit to left ac. Patient observed for 30 minutes after procedure without any incident. Patient tolerated procedure well.  Snack and drink taken.  IV needle removed intact.  Pressure dressing clean, dry and intact to left ac at time of discharge.  VS stable.

## 2018-05-28 NOTE — Patient Instructions (Signed)

## 2018-06-13 ENCOUNTER — Telehealth: Payer: Self-pay | Admitting: Gastroenterology

## 2018-06-13 NOTE — Telephone Encounter (Signed)
Spoke to patient, he thought that he had to have further CT's done in 6 months. Read through last CT report and ov note, let him know from what I was reading that since everything is stable nothing further is needed unless becomes symptomatic. I let him know that he is due for a colonoscopy in December, but that our office will contact him to schedule that procedure sometime in the fall.  Patient also mentioned for last couple of days he has been having some RUQ pain, please advise.

## 2018-06-13 NOTE — Telephone Encounter (Signed)
No plans for further CT surveillance right now in regards to prior CT findings, as per my clinic note. He is due for colonoscopy in December.  If his RUQ discomfort is mild and just started, I would observe and see if this persists or worsens. If severe, or intolerant of PO, associated with vomiting, etc, he should be evaluated. He's had numerous imaging studies of his abdomen including [place] in recent years, no evidence of gallstones we are aware of and his labs a few weeks ago were normal.

## 2018-06-14 NOTE — Telephone Encounter (Signed)
Patient advised that no further CT for surveillance are needed. He understands to monitor his RUQ pain, if symptoms worsen he needs to contact our office or if so severe go to ED.

## 2018-08-10 ENCOUNTER — Other Ambulatory Visit: Payer: Self-pay | Admitting: Hematology & Oncology

## 2018-08-27 ENCOUNTER — Inpatient Hospital Stay: Payer: 59

## 2018-08-27 ENCOUNTER — Telehealth: Payer: Self-pay | Admitting: *Deleted

## 2018-08-27 ENCOUNTER — Inpatient Hospital Stay: Payer: 59 | Attending: Hematology & Oncology | Admitting: Hematology & Oncology

## 2018-08-27 VITALS — BP 146/71 | HR 85 | Temp 98.0°F | Resp 19 | Wt 213.0 lb

## 2018-08-27 DIAGNOSIS — F418 Other specified anxiety disorders: Secondary | ICD-10-CM | POA: Diagnosis not present

## 2018-08-27 DIAGNOSIS — D45 Polycythemia vera: Secondary | ICD-10-CM | POA: Diagnosis present

## 2018-08-27 DIAGNOSIS — D500 Iron deficiency anemia secondary to blood loss (chronic): Secondary | ICD-10-CM

## 2018-08-27 DIAGNOSIS — K7460 Unspecified cirrhosis of liver: Secondary | ICD-10-CM

## 2018-08-27 DIAGNOSIS — K7581 Nonalcoholic steatohepatitis (NASH): Secondary | ICD-10-CM

## 2018-08-27 LAB — CMP (CANCER CENTER ONLY)
ALBUMIN: 4.2 g/dL (ref 3.5–5.0)
ALK PHOS: 87 U/L (ref 38–126)
ALT: 23 U/L (ref 0–44)
AST: 22 U/L (ref 15–41)
Anion gap: 12 (ref 5–15)
BUN: 17 mg/dL (ref 8–23)
CALCIUM: 10.9 mg/dL — AB (ref 8.9–10.3)
CHLORIDE: 103 mmol/L (ref 98–111)
CO2: 23 mmol/L (ref 22–32)
Creatinine: 1.56 mg/dL — ABNORMAL HIGH (ref 0.61–1.24)
GFR, EST AFRICAN AMERICAN: 49 mL/min — AB (ref >60)
GFR, Estimated: 42 mL/min — ABNORMAL LOW (ref >60)
GLUCOSE: 234 mg/dL — AB (ref 70–99)
POTASSIUM: 4.2 mmol/L (ref 3.5–5.1)
SODIUM: 138 mmol/L (ref 135–145)
Total Bilirubin: 0.9 mg/dL (ref 0.3–1.2)
Total Protein: 7.1 g/dL (ref 6.5–8.1)

## 2018-08-27 LAB — CBC WITH DIFFERENTIAL (CANCER CENTER ONLY)
BASOS PCT: 0 %
Basophils Absolute: 0 10*3/uL (ref 0.0–0.1)
EOS ABS: 0.1 10*3/uL (ref 0.0–0.5)
EOS PCT: 1 %
HCT: 47.9 % (ref 38.7–49.9)
HEMOGLOBIN: 14.2 g/dL (ref 13.0–17.1)
Lymphocytes Relative: 11 %
Lymphs Abs: 0.8 10*3/uL — ABNORMAL LOW (ref 0.9–3.3)
MCH: 19.9 pg — ABNORMAL LOW (ref 28.0–33.4)
MCHC: 29.6 g/dL — AB (ref 32.0–35.9)
MCV: 67 fL — ABNORMAL LOW (ref 82.0–98.0)
MONOS PCT: 14 %
Monocytes Absolute: 1 10*3/uL — ABNORMAL HIGH (ref 0.1–0.9)
NEUTROS PCT: 74 %
Neutro Abs: 5.4 10*3/uL (ref 1.5–6.5)
PLATELETS: 332 10*3/uL (ref 145–400)
RBC: 7.15 MIL/uL — ABNORMAL HIGH (ref 4.20–5.70)
RDW: 22.4 % — ABNORMAL HIGH (ref 11.1–15.7)
WBC: 7.3 10*3/uL (ref 4.0–10.0)

## 2018-08-27 LAB — IRON AND TIBC
IRON: 23 ug/dL — AB (ref 42–163)
SATURATION RATIOS: 5 % — AB (ref 42–163)
TIBC: 429 ug/dL — AB (ref 202–409)
UIBC: 406 ug/dL

## 2018-08-27 LAB — FERRITIN: Ferritin: <4 ng/mL — ABNORMAL LOW (ref 24–336)

## 2018-08-27 NOTE — Telephone Encounter (Addendum)
Patient is aware of results. He will call back to schedule.   ----- Message from [name], MD sent at 08/27/2018  2:00 PM EDT ----- Call - iron is quite low!!!  We still need to do a phlebotomy.  Call him to find out when he wants to do this!!  Laurey Arrow

## 2018-08-27 NOTE — Progress Notes (Signed)
Hematology and Oncology Follow Up Visit  James [name] [id number] September 18, 1946 72 y.o. 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 IV iron as indicated - last received in Decemeber 2017 x 2     Interim History:  James [name] is here today for a follow-up.  Unfortunately, he is dealing with a combination of anxiety and depression.  This is been going on for couple months.  He takes Ambien.  He is on Wellbutrin.  He is also on some Ativan.  He is had a decent summer.  He does not travel.  He is on a reduced work schedule which I think will help him out.  We are watching his iron studies.  They are now then we do have to give some IV iron depending on how low his blood gait.  Back in June, his ferritin was 9 with an iron saturation of 5%.  He has had no change in bowel or bladder habits.  He has had no leg swelling.  He has had no rashes.  There is been no nausea or vomiting.  He is on Xarelto.  He is doing well with Xarelto.  He says that his gastroenterologist is in charge of doing his abdominal x-rays.  He thinks that he is due soon for a scan or ultrasound.    Overall, his performance status is ECOG 1.  Medications:  Allergies as of 08/27/2018      Reactions   Ibuprofen Other (See Comments)   Upper GI Bled   Nsaids       Medication List        Accurate as of 08/27/18  8:08 AM. Always use your most recent med list.          AMLODIPINE BESYLATE PO Take 5 mg by mouth daily.   amLODipine 5 MG tablet Commonly known as:  NORVASC Take 5 mg by mouth daily.   buPROPion 150 MG 24 hr tablet Commonly known as:  WELLBUTRIN XL Take 150 mg by mouth daily. Take 2 tablets total of 300 mg in the morning.   [employer] B-12 500 MCG Subl Generic drug:  Cyanocobalamin Place 1 tablet under the tongue daily with breakfast.     desmopressin 0.2 MG tablet Commonly known as:  DDAVP Take 0.4 mg by mouth at bedtime.   glimepiride 2 MG tablet Commonly known as:  AMARYL Take 2-4 mg by mouth 2 (two) times daily. Take 11ms in the morning and 260m at night   LORazepam 0.5 MG tablet Commonly known as:  ATIVAN Take 0.5 mg by mouth 2 (two) times daily.   losartan-hydrochlorothiazide 100-12.5 MG tablet Commonly known as:  HYZAAR Take 1 tablet by mouth daily.   metFORMIN 500 MG 24 hr tablet Commonly known as:  GLUCOPHAGE-XR Take 500 mg by mouth 2 (two) times daily.   moxifloxacin 0.5 % ophthalmic solution Commonly known as:  VIGAMOX PLACE 1 DROP IN RIGHT EYE FOUR TIMES A DAY USE THE DAY BEFORE, DAY OF, AND DAY AFTER TREATMENT.   ONE TOUCH ULTRA TEST test strip Generic drug:  glucose blood by Other route as needed.   pantoprazole 40 MG tablet Commonly known as:  PROTONIX TAKE 1 TABLET (40 MG TOTAL) BY MOUTH DAILY BEFORE BREAKFAST.   polyethylene glycol powder powder Commonly known as:  GLYCOLAX/MIRALAX Take once to twice a day as  needed for constipation   PRESCRIPTION MEDICATION every 8 (eight) weeks. Gets injections in right eye at dr's office every 8 weeks   rosuvastatin 20 MG tablet Commonly known as:  CRESTOR Take 20 mg by mouth at bedtime.   testosterone cypionate 200 MG/ML injection Commonly known as:  DEPOTESTOSTERONE CYPIONATE Inject into the muscle every 21 ( twenty-one) days.   VIAGRA 100 MG tablet Generic drug:  sildenafil Take 100 mg by mouth daily as needed for erectile dysfunction.   XARELTO 10 MG Tabs tablet Generic drug:  rivaroxaban TAKE 1 TABLET BY MOUTH EVERY DAY   zolpidem 10 MG tablet Commonly known as:  AMBIEN Take 10 mg by mouth at bedtime as needed for sleep.       Allergies:  Allergies  Allergen Reactions  . Ibuprofen Other (See Comments)    Upper GI Bled  . Nsaids     Past Medical History, Surgical history, Social history, and Family History were reviewed  and updated.  Review of Systems: Review of Systems  Constitutional: Negative.   HENT: Negative.   Eyes: Negative.   Respiratory: Negative.   Cardiovascular: Negative.   Gastrointestinal: Negative.   Genitourinary: Negative.   Musculoskeletal: Negative.   Skin: Negative.   Neurological: Negative.   Endo/Heme/Allergies: Negative.   Psychiatric/Behavioral: Negative.     Physical Exam:  weight is 213 lb (96.6 kg). His oral temperature is 98 F (36.7 C). His blood pressure is 146/71 (abnormal) and his pulse is 85. His respiration is 19.   Wt Readings from Last 3 Encounters:  08/27/18 213 lb (96.6 kg)  05/27/18 215 lb (97.5 kg)  05/09/18 218 lb 2 oz (98.9 kg)    Physical Exam  Constitutional: He is oriented to person, place, and time.  HENT:  Head: Normocephalic and atraumatic.  Mouth/Throat: Oropharynx is clear and moist.  Eyes: Pupils are equal, round, and reactive to light. EOM are normal.  Neck: Normal range of motion.  Cardiovascular: Normal rate, regular rhythm and normal heart sounds.  Pulmonary/Chest: Effort normal and breath sounds normal.  Abdominal: Soft. Bowel sounds are normal.  Musculoskeletal: Normal range of motion. He exhibits no edema, tenderness or deformity.  Lymphadenopathy:    He has no cervical adenopathy.  Neurological: He is alert and oriented to person, place, and time.  Skin: Skin is warm and dry. No rash noted. No erythema.  Psychiatric: He has a normal mood and affect. His behavior is normal. Judgment and thought content normal.  Vitals reviewed.    Lab Results  Component Value Date   WBC 7.3 08/27/2018   HGB 14.2 08/27/2018   HCT 47.9 08/27/2018   MCV 67.0 (L) 08/27/2018   PLT 332 08/27/2018   Lab Results  Component Value Date   FERRITIN 9 (L) 05/27/2018   IRON 23 (L) 05/27/2018   TIBC 436 (H) 05/27/2018   UIBC 413 05/27/2018   IRONPCTSAT 5 (L) 05/27/2018   Lab Results  Component Value Date   RETICCTPCT 1.7 08/30/2011   RBC  7.15 (H) 08/27/2018   RETICCTABS 94.0 08/30/2011   No results found for: Nils Pyle [facility] Lab Results  Component Value Date   IGGSERUM 828 10/11/2016   IGA 177 06/22/2016   No results found for: Odetta Pink, SPEI   Chemistry      Component Value Date/Time   NA 142 05/27/2018 0737   NA 137 10/29/2017 1307   NA 134 (L) 05/10/2017 0755   K 3.9 05/27/2018  0737   K 3.6 10/29/2017 1307   K 4.2 05/10/2017 0755   CL 106 05/27/2018 0737   CL 101 10/29/2017 1307   CO2 26 05/27/2018 0737   CO2 25 10/29/2017 1307   CO2 20 (L) 05/10/2017 0755   BUN 12 05/27/2018 0737   BUN 14 10/29/2017 1307   BUN 21.0 05/10/2017 0755   CREATININE 1.40 (H) 05/27/2018 0737   CREATININE 1.4 (H) 10/29/2017 1307   CREATININE 1.3 05/10/2017 0755      Component Value Date/Time   CALCIUM 10.5 (H) 05/27/2018 0737   CALCIUM 10.3 10/29/2017 1307   CALCIUM 10.3 05/10/2017 0755   ALKPHOS 89 (H) 05/27/2018 0737   ALKPHOS 77 10/29/2017 1307   ALKPHOS 97 05/10/2017 0755   AST 23 05/27/2018 0737   AST 25 05/10/2017 0755   ALT 23 05/27/2018 0737   ALT 27 10/29/2017 1307   ALT 26 05/10/2017 0755   BILITOT 0.9 05/27/2018 0737   BILITOT 0.70 05/10/2017 0755     Impression and Plan: James [name] is 72 year old gentleman with polycythemia. He is JAK2 negative.  He wants to be phlebotomized.  We will probably go ahead and set him up for this.  We will see what his iron studies look like.  I would like to get him back to see [place] close to the holidays.  I will make sure that his blood is going to be okay so we can enjoy the holidays.  I just he will bad about the depression.  I know that this stems from the death of his son.  This is really been tough on him.   [name], MD 9/10/20198:08 AM

## 2018-08-29 ENCOUNTER — Encounter (HOSPITAL_COMMUNITY): Payer: Self-pay | Admitting: Emergency Medicine

## 2018-08-29 ENCOUNTER — Emergency Department (HOSPITAL_COMMUNITY): Payer: 59

## 2018-08-29 ENCOUNTER — Emergency Department (HOSPITAL_COMMUNITY)
Admission: EM | Admit: 2018-08-29 | Discharge: 2018-08-29 | Disposition: A | Discharge status: Home / Self Care | Payer: 59 | Attending: Emergency Medicine | Admitting: Emergency Medicine

## 2018-08-29 DIAGNOSIS — I10 Essential (primary) hypertension: Secondary | ICD-10-CM | POA: Insufficient documentation

## 2018-08-29 DIAGNOSIS — E119 Type 2 diabetes mellitus without complications: Secondary | ICD-10-CM | POA: Diagnosis not present

## 2018-08-29 DIAGNOSIS — Z7901 Long term (current) use of anticoagulants: Secondary | ICD-10-CM | POA: Diagnosis not present

## 2018-08-29 DIAGNOSIS — R55 Syncope and collapse: Secondary | ICD-10-CM | POA: Diagnosis not present

## 2018-08-29 DIAGNOSIS — R1030 Lower abdominal pain, unspecified: Secondary | ICD-10-CM | POA: Diagnosis present

## 2018-08-29 DIAGNOSIS — Z96651 Presence of right artificial knee joint: Secondary | ICD-10-CM | POA: Insufficient documentation

## 2018-08-29 DIAGNOSIS — Z79899 Other long term (current) drug therapy: Secondary | ICD-10-CM | POA: Diagnosis not present

## 2018-08-29 DIAGNOSIS — R109 Unspecified abdominal pain: Secondary | ICD-10-CM

## 2018-08-29 LAB — URINALYSIS, ROUTINE W REFLEX MICROSCOPIC
BILIRUBIN URINE: NEGATIVE
Glucose, UA: 150 mg/dL — AB
HGB URINE DIPSTICK: NEGATIVE
KETONES UR: NEGATIVE mg/dL
Leukocytes, UA: NEGATIVE
Nitrite: NEGATIVE
PROTEIN: NEGATIVE mg/dL
Specific Gravity, Urine: 1.016 (ref 1.005–1.030)
pH: 5 (ref 5.0–8.0)

## 2018-08-29 LAB — COMPREHENSIVE METABOLIC PANEL
ALBUMIN: 4.1 g/dL (ref 3.5–5.0)
ALT: 26 U/L (ref 0–44)
AST: 25 U/L (ref 15–41)
Alkaline Phosphatase: 72 U/L (ref 38–126)
Anion gap: 12 (ref 5–15)
BILIRUBIN TOTAL: 0.9 mg/dL (ref 0.3–1.2)
BUN: 16 mg/dL (ref 8–23)
CALCIUM: 10.6 mg/dL — AB (ref 8.9–10.3)
CO2: 23 mmol/L (ref 22–32)
CREATININE: 1.45 mg/dL — AB (ref 0.61–1.24)
Chloride: 103 mmol/L (ref 98–111)
GFR calc Af Amer: 54 mL/min — ABNORMAL LOW (ref >60)
GFR calc non Af Amer: 46 mL/min — ABNORMAL LOW (ref >60)
GLUCOSE: 195 mg/dL — AB (ref 70–99)
Potassium: 3.5 mmol/L (ref 3.5–5.1)
Sodium: 138 mmol/L (ref 135–145)
Total Protein: 7 g/dL (ref 6.5–8.1)

## 2018-08-29 LAB — CBC
HCT: 46.9 % (ref 39.0–52.0)
Hemoglobin: 14 g/dL (ref 13.0–17.0)
MCH: 20.3 pg — AB (ref 26.0–34.0)
MCHC: 29.9 g/dL — AB (ref 30.0–36.0)
MCV: 67.9 fL — ABNORMAL LOW (ref 78.0–100.0)
PLATELETS: 340 10*3/uL (ref 150–400)
RBC: 6.91 MIL/uL — ABNORMAL HIGH (ref 4.22–5.81)
RDW: 19.8 % — AB (ref 11.5–15.5)
WBC: 7.9 10*3/uL (ref 4.0–10.5)

## 2018-08-29 LAB — LIPASE, BLOOD: Lipase: 31 U/L (ref 11–51)

## 2018-08-29 MED ORDER — IOPAMIDOL (ISOVUE-300) INJECTION 61%
100.0000 mL | Freq: Once | INTRAVENOUS | Status: AC | PRN
  Administered 2018-08-29: 80 mL via INTRAVENOUS

## 2018-08-29 NOTE — ED Provider Notes (Signed)
[facility] DEPT Provider Note   CSN: [id number] Arrival date & time: 08/29/18  1402     History   Chief Complaint Abdominal pain, near-syncope  HPI James [name]. is a 72 y.o. male.  HPI Pt was out to eat lunch when he started to feel poorly.  He developed a pain in his lower abdomen. He then started to feel like he was going to pass out and became diaphoretic.    Pt started to lean against the wall when someone helped him to the ground.  Patient came to the emergency room for evaluation.  While he has been waiting his abdominal discomfort has resolved.  He no longer feels lightheaded.  He has not had any trouble urinating.  No vomiting or diarrhea. Past Medical History:  Diagnosis Date  . Anxiety   . Depression   . DM (diabetes mellitus) ([place])   . GERD (gastroesophageal reflux disease)   . Hiatal hernia   . History of shingles 04/03/2014  . HTN (hypertension)   . Hyperlipidemia   . Insomnia    resolved by using CPAP  . Iron deficiency anemia due to chronic blood loss 11/20/2016  . Macular degeneration   . Nocturia more than twice per night 11/11/2014   For 6 month, 5 nocturias a night.   . Obesity   . 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 Portal vein thrombosis   . Rhinitis   . Situational depression   . Sleep apnea    uses CPAP every night  . Tubular adenoma 12/10/2015   6 cecum polyps    Patient Active Problem List   Diagnosis Date Noted  . OSA on CPAP 10/30/2017  . Hypogonadism, male 10/30/2017  . Iron deficiency anemia due to chronic blood loss 11/20/2016  . Erosive gastritis with hemorrhage   . Melena   . Acute blood loss anemia 09/27/2016  . Acute upper GI bleed 09/27/2016  . Abnormal liver diagnostic imaging 06/22/2016  . Liver cirrhosis secondary to NASH ([place]) 05/03/2016  . 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 Portal vein thrombosis 04/29/2016  . Diabetes mellitus ([place]) 04/29/2016  . Leukocytosis 04/29/2016  . Dyspnea  12/31/2015  . Essential hypertension 12/31/2015  . Hyperlipidemia 12/31/2015  . Nocturia more than twice per night 11/11/2014  . Severe obesity (BMI >= 40) ([place]) 11/11/2014  . 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 Polycythemia vera ([place]) 11/29/2011    Past Surgical History:  Procedure Laterality Date  . CARDIAC CATHETERIZATION     greater 10 yrs ago, normal  . CARPAL TUNNEL RELEASE     bilateral  . COLONOSCOPY  06/2005   [facility] Dr Lajoyce Corners  . ESOPHAGOGASTRODUODENOSCOPY (EGD) WITH PROPOFOL N/A 09/28/2016   Procedure: ESOPHAGOGASTRODUODENOSCOPY (EGD) WITH PROPOFOL;  Surgeon: Gatha Mayer, MD;  Location: [facility] ENDOSCOPY;  Service: Endoscopy;  Laterality: N/A;  . IR GENERIC HISTORICAL  12/12/2016   IR US GUIDE VASC ACCESS RIGHT 12/12/2016 [name], MD [facility]  . IR GENERIC HISTORICAL  12/12/2016   IR VENOGRAM HEPATIC W HEMODYNAMIC EVALUATION 12/12/2016 [name], MD [facility]  . IR GENERIC HISTORICAL  12/12/2016   IR TRANSCATHETER BX 12/12/2016 [name], MD [facility]  . ROTATOR CUFF REPAIR     right  . TENDON REPAIR     left arm  . TONSILLECTOMY    . TOTAL KNEE ARTHROPLASTY Right 2010  . [place] EXTRACTION          Home Medications  Prior to Admission medications   Medication Sig Start Date End Date Taking? Authorizing Provider  amLODipine (NORVASC) 5 MG tablet Take 5 mg by mouth daily. 05/25/18  Yes [provider]  buPROPion (WELLBUTRIN XL) 150 MG 24 hr tablet Take 150 mg by mouth daily. Take 2 tablets total of 300 mg in the morning. 05/16/18  Yes [provider]  [employer] B-12 500 MCG SUBL Place 1 tablet under the tongue daily with breakfast.  09/17/14  Yes [provider]  desmopressin (DDAVP) 0.2 MG tablet Take 0.4 mg by mouth at bedtime.  03/21/14  Yes [provider]  glimepiride (AMARYL) 2 MG tablet Take 2-4 mg by mouth 2 (two) times daily. Take 109ms in the morning and 233m at night 03/21/14  Yes [provider]  losartan-hydrochlorothiazide (HYZAAR) 100-12.5 MG tablet Take 1 tablet by mouth daily.   Yes [provider]  metFORMIN (GLUCOPHAGE-XR) 500 MG 24 hr tablet Take 500 mg by mouth 2 (two) times daily.    Yes [provider]  moxifloxacin (VIGAMOX) 0.5 % ophthalmic solution PLACE 1 DROP IN RIGHT EYE FOUR TIMES A DAY USE THE DAY BEFORE, DAY OF, AND DAY AFTER TREATMENT. 02/15/18  Yes [provider]  pantoprazole (PROTONIX) 40 MG tablet TAKE 1 TABLET (40 MG TOTAL) BY MOUTH DAILY BEFORE BREAKFAST. 12/04/17  Yes Armbruster, StCarlota RaspberryMD  PRESCRIPTION MEDICATION every 8 (eight) weeks. Gets injections in right eye at dr's office every 8 weeks   Yes [provider]  rosuvastatin (CRESTOR) 20 MG tablet Take 20 mg by mouth at bedtime.    Yes [provider]  testosterone cypionate (DEPOTESTOSTERONE CYPIONATE) 200 MG/ML injection Inject into the muscle every 21 ( twenty-one) days.    Yes [provider]  VIAGRA 100 MG tablet Take 100 mg by mouth daily as needed for erectile dysfunction.  10/26/14  Yes [provider]  XARELTO 10 MG TABS tablet TAKE 1 TABLET BY MOUTH EVERY DAY 08/12/18  Yes Ennever, PeRudell CobbMD  zolpidem (AMBIEN) 10 MG tablet Take 10 mg by mouth at bedtime as needed for sleep.   Yes [provider]  ONE TOUCH ULTRA TEST test strip by Other route as needed.  08/04/13   [provider]  polyethylene glycol powder (GLYCOLAX/MIRALAX) powder Take once to twice a day as needed for constipation Patient not taking: Reported on 08/29/2018 05/09/18   Armbruster, StCarlota RaspberryMD    Family History Family History  Problem Relation Age of Onset  . Colon cancer Unknown   . Heart failure Unknown   . Heart attack Unknown   . Lung disease Father        history  . Diabetes Mother        history  . Stroke Mother        history  . Colon polyps Neg Hx   . Rectal cancer Neg Hx   . Stomach cancer Neg Hx   . Esophageal  cancer Neg Hx     Social History Social History   Tobacco Use  . Smoking status: Never Smoker  . Smokeless tobacco: Never Used  . Tobacco comment: never used tobacco  Substance Use Topics  . Alcohol use: Yes    Alcohol/week: 0.0 standard drinks    Comment: rarely   . Drug use: No     Allergies   Ibuprofen and Nsaids   Review of Systems Review of Systems  All other systems reviewed and are negative.  Physical Exam Updated Vital Signs BP 136/87   Pulse 77   Temp 98.3 F (36.8 C) (Oral)   Resp 20   SpO2 93%   Physical Exam  Constitutional: He appears well-developed and well-nourished. No distress.  HENT:  Head: Normocephalic and atraumatic.  Right Ear: External ear normal.  Left Ear: External ear normal.  Eyes: Conjunctivae are normal. Right eye exhibits no discharge. Left eye exhibits no discharge. No scleral icterus.  Neck: Neck supple. No tracheal deviation present.  Cardiovascular: Normal rate, regular rhythm and intact distal pulses.  Pulmonary/Chest: Effort normal and breath sounds normal. No stridor. No respiratory distress. He has no wheezes. He has no rales.  Abdominal: Soft. Bowel sounds are normal. He exhibits no distension. There is no tenderness. There is no rebound and no guarding.  Musculoskeletal: He exhibits no edema or tenderness.  Neurological: He is alert. He has normal strength. No cranial nerve deficit (no facial droop, extraocular movements intact, no slurred speech) or sensory deficit. He exhibits normal muscle tone. He displays no seizure activity. Coordination normal.  Skin: Skin is warm and dry. No rash noted.  Psychiatric: He has a normal mood and affect.  Nursing note and vitals reviewed.    ED Treatments / Results  Labs (all labs ordered are listed, but only abnormal results are displayed) Labs Reviewed  COMPREHENSIVE METABOLIC PANEL - Abnormal; Notable for the following components:      Result Value   Glucose, Bld 195 (*)     Creatinine, Ser 1.45 (*)    Calcium 10.6 (*)    GFR calc non Af Amer 46 (*)    GFR calc Af Amer 54 (*)    All other components within normal limits  CBC - Abnormal; Notable for the following components:   RBC 6.91 (*)    MCV 67.9 (*)    MCH 20.3 (*)    MCHC 29.9 (*)    RDW 19.8 (*)    All other components within normal limits  URINALYSIS, ROUTINE W REFLEX MICROSCOPIC - Abnormal; Notable for the following components:   Glucose, UA 150 (*)    All other components within normal limits  LIPASE, BLOOD    EKG EKG Interpretation  Date/Time:  Thursday August 29 2018 19:28:12 EDT Ventricular Rate:  78 PR Interval:    QRS Duration: 87 QT Interval:  361 QTC Calculation: 412 R Axis:   -11 Text Interpretation:  Sinus rhythm No significant change since last tracing except PVC is new Confirmed by Dorie Rank [phone]) on 08/29/2018 7:49:48 PM   Radiology Ct Abdomen Pelvis W Contrast  Result Date: 08/29/2018 CLINICAL DATA:  Abdominal pain, nausea, and vomiting, sudden onset after lunch, diaphoretic; history hypertension, diabetes mellitus, GERD, cirrhosis due to NASH EXAM: CT ABDOMEN AND PELVIS WITH CONTRAST TECHNIQUE: Multidetector CT imaging of the abdomen and pelvis was performed using the standard protocol following bolus administration of intravenous contrast. Sagittal and coronal MPR images reconstructed from axial data set. CONTRAST:  33m ISOVUE-300 IOPAMIDOL (ISOVUE-300) INJECTION 61% IV. No oral contrast. COMPARISON:  02/27/2018 FINDINGS: Lower chest: Lung bases hyperaerated but clear. Hepatobiliary: Unremarkable gallbladder. Minimally nodular hepatic contour consistent with history of cirrhosis. No definite focal hepatic abnormalities. Pancreas: Atrophic no mass or fluid collection Spleen: Lobular and deformed question sequela of prior infarcts. Subcapsular cystic focus at lateral mid spleen appears unchanged. Adrenals/Urinary Tract: Adrenal glands normal appearance. BILATERAL renal cysts  largest inferior pole RIGHT kidney 7.2 x 6.2 cm. No solid mass or hydronephrosis. No urinary  tract calcifications. Bladder and ureters unremarkable. Stomach/Bowel: Normal appendix. Scattered stool throughout colon. Decompressed stomach, unable to exclude gastric wall thickening in this setting. Diverticulosis of descending and sigmoid colon. No additional bowel abnormalities. Vascular/Lymphatic: Atherosclerotic calcifications aorta and iliac arteries as well as splenic artery. Aorta normal caliber. Few pelvic phleboliths. No adenopathy. Reproductive: Minimal prostatic enlargement. Other: Small focus of poorly defined infiltration in the omentum 18 x 11 mm image 52 grossly unchanged, present since 2018 exam, could reflect sequela of prior omental infarct. Musculoskeletal: Mild scattered degenerative changes of thoracolumbar spine. Mild chronic anterolisthesis L4-L5. IMPRESSION: No acute intra-abdominal or intrapelvic abnormalities. Distal colonic diverticulosis without evidence of diverticulitis. Chronic splenic changes likely reflecting sequela of infarcts. BILATERAL renal cysts. Small focus of chronic infiltration of the greater omentum question sequela of prior omental infarct. Electronically Signed   By: Lavonia Dana M.D.   On: 08/29/2018 21:28    Procedures Procedures (including critical care time)  Medications Ordered in ED Medications  iopamidol (ISOVUE-300) 61 % injection 100 mL (80 mLs Intravenous Contrast Given 08/29/18 2107)     Initial Impression / Assessment and Plan / ED Course  I have reviewed the triage vital signs and the nursing notes.  Pertinent labs & imaging results that were available during my care of the patient were reviewed by me and considered in my medical decision making (see chart for details).   Patient presented to the emergency room for evaluation after a near syncopal episode.  It was preceded by abdominal cramping.  Patient's symptoms resolved by the time I evaluated  him.  His laboratory tests are reassuring.  With his complaint of acute abdominal pain and near syncope a CT abdomen pelvis was performed.  No acute findings noted.  Patient has been monitored emergency room.  No recurrent syncopal episodes.  He may have had a vasovagal episode in relation to an abdominal cramp.  At this time there does not appear to be any evidence of an acute emergency medical condition and the patient appears stable for discharge with appropriate outpatient follow up.   Final Clinical Impressions(s) / ED Diagnoses   Final diagnoses:  Abdominal pain, unspecified abdominal location  Near syncope      Dorie Rank, MD 08/29/18 2151

## 2018-08-29 NOTE — ED Triage Notes (Signed)
Per [facility] pt from restaurant where Eating lunch with sudden onset abd pain and got "nervous stomach and when on his way to bathroom went down to his knees".  Pt was diaphoretic when EMS arrived at seen.  12 lead EKG normal, 20g left hand.  Vitals: 127/90, 78HR, 16R, 97%, CBG 73.

## 2018-08-29 NOTE — ED Notes (Signed)
Pt is aware a urine sample but is unable to provide one at this time. Specimen cup provided in lobby.

## 2018-08-29 NOTE — Discharge Instructions (Signed)
Drink plenty of fluids, follow-up with your doctor if you have any recurrent symptoms, return to the emergency room for recurrent syncopal episodes

## 2018-08-30 ENCOUNTER — Inpatient Hospital Stay: Payer: 59

## 2018-08-30 ENCOUNTER — Telehealth: Payer: Self-pay | Admitting: Gastroenterology

## 2018-08-30 ENCOUNTER — Other Ambulatory Visit: Payer: Self-pay

## 2018-08-30 VITALS — BP 120/60 | HR 68 | Temp 97.8°F | Resp 20

## 2018-08-30 DIAGNOSIS — D45 Polycythemia vera: Secondary | ICD-10-CM | POA: Diagnosis not present

## 2018-08-30 DIAGNOSIS — D500 Iron deficiency anemia secondary to blood loss (chronic): Secondary | ICD-10-CM

## 2018-08-30 NOTE — Patient Instructions (Signed)

## 2018-08-30 NOTE — Telephone Encounter (Signed)
Patient just wanted to make you aware and asked if you would look over ED visit from 9/12, CT and lab work.

## 2018-08-30 NOTE — Telephone Encounter (Signed)
Pt would like to speak with you regarding his visit to the ED yesterday.

## 2018-08-30 NOTE — Progress Notes (Signed)
James [name]. presents today for phlebotomy per MD orders. Phlebotomy procedure started at 1439 and ended at 60. 512 grams removed. Patient observed for 30 minutes after procedure without any incident. Patient tolerated procedure well. IV needle removed intact.

## 2018-09-01 NOTE — Telephone Encounter (Signed)
I reviewed ER visit notes, labs, and CT scan. The scan does not show any new changes or concerning pathology in regards to the prior findings and issues he has had. Unclear what caused his symptoms from the other day but his CT is reassuring. He can follow up with me for a routine visit in clinic in the upcoming months, unless he still feels poorly and wants to be seen sooner. Thanks

## 2018-09-02 NOTE — Telephone Encounter (Signed)
Patient advised of Dr. Doyne Keel plan, he has a recall for colonoscopy in December and patient has opted to follow up at that time. He understands that should he have any questions or concerns to contact the office.

## 2018-09-05 ENCOUNTER — Other Ambulatory Visit: Payer: 59

## 2018-09-05 ENCOUNTER — Ambulatory Visit: Payer: 59 | Admitting: Hematology & Oncology

## 2018-10-31 ENCOUNTER — Ambulatory Visit: Payer: 59 | Admitting: Neurology

## 2018-11-04 ENCOUNTER — Other Ambulatory Visit: Payer: Self-pay

## 2018-11-04 ENCOUNTER — Inpatient Hospital Stay: Payer: 59 | Attending: Hematology & Oncology

## 2018-11-04 ENCOUNTER — Inpatient Hospital Stay (HOSPITAL_BASED_OUTPATIENT_CLINIC_OR_DEPARTMENT_OTHER): Payer: 59 | Admitting: Hematology & Oncology

## 2018-11-04 ENCOUNTER — Encounter: Payer: Self-pay | Admitting: Hematology & Oncology

## 2018-11-04 VITALS — BP 133/78 | HR 87 | Temp 98.3°F | Resp 18 | Wt 217.0 lb

## 2018-11-04 DIAGNOSIS — D500 Iron deficiency anemia secondary to blood loss (chronic): Secondary | ICD-10-CM

## 2018-11-04 DIAGNOSIS — K7460 Unspecified cirrhosis of liver: Secondary | ICD-10-CM

## 2018-11-04 DIAGNOSIS — D45 Polycythemia vera: Secondary | ICD-10-CM | POA: Diagnosis present

## 2018-11-04 DIAGNOSIS — K7581 Nonalcoholic steatohepatitis (NASH): Secondary | ICD-10-CM

## 2018-11-04 DIAGNOSIS — Z79899 Other long term (current) drug therapy: Secondary | ICD-10-CM | POA: Insufficient documentation

## 2018-11-04 LAB — CMP (CANCER CENTER ONLY)
ALK PHOS: 91 U/L (ref 38–126)
ALT: 24 U/L (ref 0–44)
AST: 26 U/L (ref 15–41)
Albumin: 4 g/dL (ref 3.5–5.0)
Anion gap: 10 (ref 5–15)
BILIRUBIN TOTAL: 0.7 mg/dL (ref 0.3–1.2)
BUN: 15 mg/dL (ref 8–23)
CALCIUM: 10.6 mg/dL — AB (ref 8.9–10.3)
CO2: 26 mmol/L (ref 22–32)
CREATININE: 1.47 mg/dL — AB (ref 0.61–1.24)
Chloride: 104 mmol/L (ref 98–111)
GFR, EST NON AFRICAN AMERICAN: 46 mL/min — AB (ref >60)
GFR, Est AFR Am: 53 mL/min — ABNORMAL LOW (ref >60)
Glucose, Bld: 114 mg/dL — ABNORMAL HIGH (ref 70–99)
Potassium: 3.9 mmol/L (ref 3.5–5.1)
Sodium: 140 mmol/L (ref 135–145)
TOTAL PROTEIN: 7 g/dL (ref 6.5–8.1)

## 2018-11-04 LAB — CBC WITH DIFFERENTIAL (CANCER CENTER ONLY)
ABS IMMATURE GRANULOCYTES: 0.02 10*3/uL (ref 0.00–0.07)
BASOS ABS: 0.1 10*3/uL (ref 0.0–0.1)
Basophils Relative: 1 %
EOS ABS: 0.1 10*3/uL (ref 0.0–0.5)
EOS PCT: 1 %
HEMATOCRIT: 46.8 % (ref 39.0–52.0)
HEMOGLOBIN: 12.8 g/dL — AB (ref 13.0–17.0)
Immature Granulocytes: 0 %
LYMPHS PCT: 14 %
Lymphs Abs: 1.1 10*3/uL (ref 0.7–4.0)
MCH: 19.1 pg — AB (ref 26.0–34.0)
MCHC: 27.4 g/dL — ABNORMAL LOW (ref 30.0–36.0)
MCV: 69.9 fL — AB (ref 80.0–100.0)
Monocytes Absolute: 1.3 10*3/uL — ABNORMAL HIGH (ref 0.1–1.0)
Monocytes Relative: 17 %
NRBC: 0 % (ref 0.0–0.2)
Neutro Abs: 5.1 10*3/uL (ref 1.7–7.7)
Neutrophils Relative %: 67 %
Platelet Count: 361 10*3/uL (ref 150–400)
RBC: 6.7 MIL/uL — AB (ref 4.22–5.81)
RDW: 22.1 % — ABNORMAL HIGH (ref 11.5–15.5)
WBC: 7.7 10*3/uL (ref 4.0–10.5)

## 2018-11-04 NOTE — Progress Notes (Signed)
Hematology and Oncology Follow Up Visit  James [name] [id number] 06/02/1946 72 y.o. 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 IV iron as indicated - last received in Decemeber 2017 x 2     Interim History:  James [name] is here today for a follow-up.  He certainly looks much more peppy.  Last time that we had seen him, he was not doing all that well.  He was having problems with anxiety and depression.  We did phlebotomize him the last time that he was here.  He has had no problems with rashes.  He has had no headaches.  He has had no cough.  He has had no change in bowel or bladder habits.  When we last saw him, his iron studies showed a ferritin less than 4 with an iron saturation of 5%.  He has had no fever.  He has had no leg swelling.  He has had no obvious bleeding.  Overall, his performance status is ECOG 1.  Medications:  Allergies as of 11/04/2018      Reactions   Ibuprofen Other (See Comments)   Upper GI Bled   Nsaids       Medication List        Accurate as of 11/04/18  2:40 PM. Always use your most recent med list.          amLODipine 5 MG tablet Commonly known as:  NORVASC Take 5 mg by mouth daily.   buPROPion 150 MG 24 hr tablet Commonly known as:  WELLBUTRIN XL Take 150 mg by mouth daily. Take 2 tablets total of 300 mg in the morning.   [employer] B-12 500 MCG Subl Generic drug:  Cyanocobalamin Place 1 tablet under the tongue daily with breakfast.   desmopressin 0.2 MG tablet Commonly known as:  DDAVP Take 0.4 mg by mouth at bedtime.   glimepiride 2 MG tablet Commonly known as:  AMARYL Take 2-4 mg by mouth 2 (two) times daily. Take 10ms in the morning and 283m at night   losartan-hydrochlorothiazide 100-12.5 MG tablet Commonly known as:  HYZAAR Take 1 tablet by mouth daily.     metFORMIN 500 MG 24 hr tablet Commonly known as:  GLUCOPHAGE-XR Take 500 mg by mouth 2 (two) times daily.   moxifloxacin 0.5 % ophthalmic solution Commonly known as:  VIGAMOX PLACE 1 DROP IN RIGHT EYE FOUR TIMES A DAY USE THE DAY BEFORE, DAY OF, AND DAY AFTER TREATMENT.   ONE TOUCH ULTRA TEST test strip Generic drug:  glucose blood by Other route as needed.   pantoprazole 40 MG tablet Commonly known as:  PROTONIX TAKE 1 TABLET (40 MG TOTAL) BY MOUTH DAILY BEFORE BREAKFAST.   PRESCRIPTION MEDICATION every 8 (eight) weeks. Gets injections in right eye at dr's office every 8 weeks   rosuvastatin 20 MG tablet Commonly known as:  CRESTOR Take 20 mg by mouth at bedtime.   testosterone cypionate 200 MG/ML injection Commonly known as:  DEPOTESTOSTERONE CYPIONATE Inject into the muscle every 21 ( twenty-one) days.   VIAGRA 100 MG tablet Generic drug:  sildenafil Take 100 mg by mouth daily as needed for erectile dysfunction.   XARELTO 10 MG Tabs tablet Generic drug:  rivaroxaban TAKE 1 TABLET BY MOUTH EVERY DAY   zolpidem 10 MG tablet Commonly known as:  AMBIEN Take 10 mg  by mouth at bedtime as needed for sleep.       Allergies:  Allergies  Allergen Reactions  . Ibuprofen Other (See Comments)    Upper GI Bled  . Nsaids     Past Medical History, Surgical history, Social history, and Family History were reviewed and updated.  Review of Systems: Review of Systems  Constitutional: Negative.   HENT: Negative.   Eyes: Negative.   Respiratory: Negative.   Cardiovascular: Negative.   Gastrointestinal: Negative.   Genitourinary: Negative.   Musculoskeletal: Negative.   Skin: Negative.   Neurological: Negative.   Endo/Heme/Allergies: Negative.   Psychiatric/Behavioral: Negative.     Physical Exam:  weight is 217 lb (98.4 kg). His oral temperature is 98.3 F (36.8 C). His blood pressure is 133/78 and his pulse is 87. His respiration is 18 and oxygen saturation is  93%.   Wt Readings from Last 3 Encounters:  11/04/18 217 lb (98.4 kg)  08/27/18 213 lb (96.6 kg)  05/27/18 215 lb (97.5 kg)    Physical Exam  Constitutional: He is oriented to person, place, and time.  HENT:  Head: Normocephalic and atraumatic.  Mouth/Throat: Oropharynx is clear and moist.  Eyes: Pupils are equal, round, and reactive to light. EOM are normal.  Neck: Normal range of motion.  Cardiovascular: Normal rate, regular rhythm and normal heart sounds.  Pulmonary/Chest: Effort normal and breath sounds normal.  Abdominal: Soft. Bowel sounds are normal.  Musculoskeletal: Normal range of motion. He exhibits no edema, tenderness or deformity.  Lymphadenopathy:    He has no cervical adenopathy.  Neurological: He is alert and oriented to person, place, and time.  Skin: Skin is warm and dry. No rash noted. No erythema.  Psychiatric: He has a normal mood and affect. His behavior is normal. Judgment and thought content normal.  Vitals reviewed.    Lab Results  Component Value Date   WBC 7.7 11/04/2018   HGB 12.8 (L) 11/04/2018   HCT 46.8 11/04/2018   MCV 69.9 (L) 11/04/2018   PLT 361 11/04/2018   Lab Results  Component Value Date   FERRITIN <4 (L) 08/27/2018   IRON 23 (L) 08/27/2018   TIBC 429 (H) 08/27/2018   UIBC 406 08/27/2018   IRONPCTSAT 5 (L) 08/27/2018   Lab Results  Component Value Date   RETICCTPCT 1.7 08/30/2011   RBC 6.70 (H) 11/04/2018   RETICCTABS 94.0 08/30/2011   No results found for: Nils Pyle [facility] Lab Results  Component Value Date   IGGSERUM 828 10/11/2016   IGA 177 06/22/2016   No results found for: Ronnald Ramp, A1GS, Nelida Meuse, SPEI   Chemistry      Component Value Date/Time   NA 138 08/29/2018 1414   NA 137 10/29/2017 1307   NA 134 (L) 05/10/2017 0755   K 3.5 08/29/2018 1414   K 3.6 10/29/2017 1307   K 4.2 05/10/2017 0755   CL 103 08/29/2018 1414   CL 101 10/29/2017 1307    CO2 23 08/29/2018 1414   CO2 25 10/29/2017 1307   CO2 20 (L) 05/10/2017 0755   BUN 16 08/29/2018 1414   BUN 14 10/29/2017 1307   BUN 21.0 05/10/2017 0755   CREATININE 1.45 (H) 08/29/2018 1414   CREATININE 1.56 (H) 08/27/2018 0740   CREATININE 1.4 (H) 10/29/2017 1307   CREATININE 1.3 05/10/2017 0755      Component Value Date/Time   CALCIUM 10.6 (H) 08/29/2018 1414   CALCIUM 10.3 10/29/2017 1307  CALCIUM 10.3 05/10/2017 0755   ALKPHOS 72 08/29/2018 1414   ALKPHOS 77 10/29/2017 1307   ALKPHOS 97 05/10/2017 0755   AST 25 08/29/2018 1414   AST 22 08/27/2018 0740   AST 25 05/10/2017 0755   ALT 26 08/29/2018 1414   ALT 23 08/27/2018 0740   ALT 27 10/29/2017 1307   ALT 26 05/10/2017 0755   BILITOT 0.9 08/29/2018 1414   BILITOT 0.9 08/27/2018 0740   BILITOT 0.70 05/10/2017 0755     Impression and Plan: James [name] is 72 year old gentleman with polycythemia. He is JAK2 negative.  I do not think that we have to phlebotomize him today.  I will plan to see him back in 2 months.  We will get him through the holiday season.  Hopefully, he will have a little bit of a better outlook with respect to his health.   [name], MD 11/18/20192:40 PM

## 2018-11-05 ENCOUNTER — Other Ambulatory Visit: Payer: 59

## 2018-11-05 ENCOUNTER — Inpatient Hospital Stay: Payer: 59

## 2018-11-05 ENCOUNTER — Ambulatory Visit: Payer: 59 | Admitting: Hematology & Oncology

## 2018-11-05 LAB — IRON AND TIBC
Iron: 24 ug/dL — ABNORMAL LOW (ref 42–163)
Saturation Ratios: 5 % — ABNORMAL LOW (ref 20–55)
TIBC: 451 ug/dL — AB (ref 202–409)
UIBC: 427 ug/dL — ABNORMAL HIGH (ref 117–376)

## 2018-11-05 LAB — FERRITIN: Ferritin: 5 ng/mL — ABNORMAL LOW (ref 24–336)

## 2018-11-07 ENCOUNTER — Telehealth: Payer: Self-pay | Admitting: *Deleted

## 2018-11-07 NOTE — Telephone Encounter (Signed)
-----   Message from [name], MD sent at 11/07/2018  1:50 PM EST ----- Call - the iron is low, but no need to give IV iron back -- your blood is at a good level!!  James [name]

## 2018-11-07 NOTE — Telephone Encounter (Signed)
Patient notified per order of Dr. [name] that the iron is low, but no need to give IV iron back, blood is at a good level. Patient appreciative of call and has no questions at this time.

## 2018-11-15 ENCOUNTER — Encounter (HOSPITAL_COMMUNITY): Payer: Self-pay | Admitting: Emergency Medicine

## 2018-11-15 ENCOUNTER — Ambulatory Visit (HOSPITAL_COMMUNITY)
Admission: EM | Admit: 2018-11-15 | Discharge: 2018-11-15 | Disposition: A | Discharge status: Home / Self Care | Payer: 59 | Attending: Family Medicine | Admitting: Family Medicine

## 2018-11-15 DIAGNOSIS — S0101XA Laceration without foreign body of scalp, initial encounter: Secondary | ICD-10-CM | POA: Diagnosis not present
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
## 2018-11-15 NOTE — Discharge Instructions (Signed)
Keep clean Watch for infection Stitches out in 7 days

## 2018-11-15 NOTE — ED Notes (Signed)
Applied band aid over laceration

## 2018-11-22 ENCOUNTER — Ambulatory Visit (HOSPITAL_COMMUNITY)
Admission: EM | Admit: 2018-11-22 | Discharge: 2018-11-22 | Disposition: A | Discharge status: Home / Self Care | Payer: 59

## 2018-11-22 NOTE — ED Triage Notes (Signed)
Pt presents to have sutures removed from head.

## 2018-11-22 NOTE — ED Notes (Signed)
Pt presents to have 4 sutures removed from head

## 2018-11-23 ENCOUNTER — Other Ambulatory Visit: Payer: Self-pay | Admitting: Gastroenterology

## 2018-12-03 ENCOUNTER — Telehealth: Payer: Self-pay | Admitting: Gastroenterology

## 2018-12-03 ENCOUNTER — Telehealth: Payer: Self-pay

## 2018-12-03 ENCOUNTER — Encounter: Payer: Self-pay | Admitting: Gastroenterology

## 2018-12-03 ENCOUNTER — Ambulatory Visit: Payer: 59 | Admitting: Gastroenterology

## 2018-12-03 VITALS — BP 142/70 | HR 81 | Ht 66.0 in | Wt 219.0 lb

## 2018-12-03 DIAGNOSIS — D45 Polycythemia vera: Secondary | ICD-10-CM | POA: Diagnosis not present

## 2018-12-03 DIAGNOSIS — D509 Iron deficiency anemia, unspecified: Secondary | ICD-10-CM

## 2018-12-03 DIAGNOSIS — Z86010 Personal history of colonic polyps: Secondary | ICD-10-CM

## 2018-12-03 DIAGNOSIS — Z7901 Long term (current) use of anticoagulants: Secondary | ICD-10-CM | POA: Diagnosis not present

## 2018-12-03 DIAGNOSIS — K760 Fatty (change of) liver, not elsewhere classified: Secondary | ICD-10-CM

## 2018-12-03 MED ORDER — SUPREP BOWEL PREP KIT 17.5-3.13-1.6 GM/177ML PO SOLN: ORAL | 0 refills | Status: DC

## 2018-12-03 NOTE — Telephone Encounter (Signed)
Please notify Dr. Havery Moros and [facility] that we do not follow the pt's Xarelto.  Looks like Dr. [name] does.  They will need to contact him

## 2018-12-03 NOTE — Telephone Encounter (Signed)
[facility] Pre-operative Risk Assessment     Request for surgical clearance:     Endoscopy Procedure  What type of surgery is being performed?     Colonoscopy and Upper Endoscopy  When is this surgery scheduled?  Monday,  12-09-18  What type of clearance is required ?   Pharmacy  Are there any medications that need to be held prior to surgery and how long? Xarelto 3 days  Practice name and name of physician performing surgery?   Dr. Buckingham Cellar, [facility]  What is your office phone and fax number?      Phone- [phone]  Fax- [phone] [place], [place] asap  Anesthesia type (None, local, MAC, general) ?       MAC  THANK YOU!

## 2018-12-03 NOTE — Telephone Encounter (Signed)
I have notified requesting office that we do not follow pt's Xarelto and for them to contact Dr.Ennever

## 2018-12-03 NOTE — Telephone Encounter (Signed)
Dr. [name], James [name] is scheduled for his  Procedure on Monday, 12-09-18.  Ok for him to hold his Xarelto for 3 days prior to procedure? Thank you.

## 2018-12-03 NOTE — Patient Instructions (Addendum)
If you are age 73 or older, your body mass index should be between 23-30. Your Body mass index is 35.35 kg/m. If this is out of the aforementioned range listed, please consider follow up with your Primary Care Provider.  If you are age 58 or younger, your body mass index should be between 19-25. Your Body mass index is 35.35 kg/m. If this is out of the aformentioned range listed, please consider follow up with your Primary Care Provider.   You have been scheduled for a endoscopy/colonoscopy. Please follow written instructions given to you at your visit today.  Please pick up your prep supplies at the pharmacy within the next 1-3 days. If you use inhalers (even only as needed), please bring them with you on the day of your procedure. Your physician has requested that you go to www.startemmi.com and enter the access code given to you at your visit today. This web site gives a general overview about your procedure. However, you should still follow specific instructions given to you by our office regarding your preparation for the procedure.  You will be contacted by our office prior to your procedure for directions on holding your Xarelto.  If you do not hear from our office 1 week prior to your scheduled procedure, please call [phone] to discuss.   Thank you for entrusting me with your care and for choosing [facility], Dr. Amherst Cellar

## 2018-12-03 NOTE — Telephone Encounter (Signed)
Kim from [facility] [facility] called and advised for the filling for XARELTO 10 MG TABS  For the pt it has to go through his pcp Dr. Burney Gauze.

## 2018-12-03 NOTE — Progress Notes (Signed)
HPI :  72 year old male here for a follow-up visit.   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 He was started on anticoagulation with Xarelto.Incidentally noted at that time on imaging or changes concerning for underlying cirrhosis of the liver.Underwent ultrasound elastography showing F3/4 fibrosis and possible changes of cirrhosis. Along with this was anindeterminate liver lesion, for which the patient had a follow-up MRI of the liver which showed this appeared to be a benign cystic lesion. AFP was normal at the time. The patient also had some labs done to evaluate for chronic liver diseases which appeared negative.He was admitted for possible GI bleed, had an EGD done on 09/28/16 - multiple erosions / gastritis, benign fundic gland polyps, no varices noted. Clo test negative for H pylori. It was thought perhaps he had gastritis related to NSAID  He had negative labs for chronic liver diseases negative including labs for hemochromatosis. Transjugular liver biopsy with portal pressures obtained. He had a gradient of 6 which barely metcriteria for portal hypertension. Fortunately his liver biopsy does not show any evidence of cirrhosis, he had only some mild steatosis and is otherwise normal biopsy. Follow up MRI liver on 01/18/17 showed stable hepatic cyst, morphologic changes of mild cirrhosis. He had IV Iron infusion. Hgb normalized. Capsule endoscopy was performed and generally was a good prep with good views. 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  CT scan on 08/01/17 showedfocal 3cm area of the central omentum, thought to be related to omental infarct.He had collateralization around the SMV indicating prior chronic thrombus of the SMV.Also with interval  evolution of splenic infarcts and chronic occulusion of the splenic vein. He has remained on Xarelto and following with Dr. [name].  He's had some follow up CT scans which have showed changes of sequelae from splenic and omental infarcts. 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 Hepatic steatosis.  Since I have last seen him he's been evaluated for depression and anxiety. He is currently on Ambien for sleep as well as Lorazepam and Bupropion, reports he's doing much better in regards to those issues.  Dr. [name] obtained some labs for him in November, and he has an ongoing iron deficiency / microcytosis, with a mild anemia. This has not been repleted given his history of polycythemia. His last colonoscopy was December 2016 at which time he had 6 small adenomas, he is due for surveillance colonoscopy at this time. He reports his bowels are otherwise fairly stable in regards to form, he is not seeing any blood in stools. He does report his stools are extremely foul-smelling at this time. He continues takes Xarelto. He is also taking Protonix 40 mg once a day. He reports this controls his upper tract symptoms, he is not having any dark stools or upper abdominal pain. No NSAID use.  Most recently he had imaging while done in the ED in September for pain, CT scan showed no acute changes - sequela of chronic splenic infarcts, sequela of prior omental infarct  Endoscopic history: Colonoscopy 12/10/15: 6 small cecal polyps, moderate diverticulosis in the sigmoid colon and internal hemorrhoids. Pathology revealed tubular adenomas    Past Medical History:  Diagnosis Date  . Anxiety   . Depression   . DM (diabetes mellitus) ([place])   .  GERD (gastroesophageal reflux disease)   . Hiatal hernia   . History of shingles 04/03/2014  . HTN (hypertension)   . Hyperlipidemia   . Insomnia    resolved by using CPAP  . Iron deficiency anemia due to chronic blood loss 11/20/2016  .  Macular degeneration   . Nocturia more than twice per night 11/11/2014   For 6 month, 5 nocturias a night.   . Obesity   . 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 Portal vein thrombosis   . Rhinitis   . Situational depression   . Sleep apnea    uses CPAP every night  . Tubular adenoma 12/10/2015   6 cecum polyps     Past Surgical History:  Procedure Laterality Date  . CARDIAC CATHETERIZATION     greater 10 yrs ago, normal  . CARPAL TUNNEL RELEASE     bilateral  . COLONOSCOPY  06/2005   [facility] Dr Lajoyce Corners  . ESOPHAGOGASTRODUODENOSCOPY (EGD) WITH PROPOFOL N/A 09/28/2016   Procedure: ESOPHAGOGASTRODUODENOSCOPY (EGD) WITH PROPOFOL;  Surgeon: Gatha Mayer, MD;  Location: [facility];  Service: Endoscopy;  Laterality: N/A;  . IR GENERIC HISTORICAL  12/12/2016   IR US GUIDE VASC ACCESS RIGHT 12/12/2016 [name], MD [facility]  . IR GENERIC HISTORICAL  12/12/2016   IR VENOGRAM HEPATIC W HEMODYNAMIC EVALUATION 12/12/2016 [name], MD [facility]  . IR GENERIC HISTORICAL  12/12/2016   IR TRANSCATHETER BX 12/12/2016 [name], MD [facility]  . ROTATOR CUFF REPAIR     right  . TENDON REPAIR     left arm  . TONSILLECTOMY    . TOTAL KNEE ARTHROPLASTY Right 2010  . WISDOM TOOTH EXTRACTION     Family History  Problem Relation Age of Onset  . Colon cancer Other   . Heart failure Other   . Heart attack Other   . Lung disease Father        history  . Diabetes Mother        history  . Stroke Mother        history  . Colon polyps Neg Hx   . Rectal cancer Neg Hx   . Stomach cancer Neg Hx   . Esophageal cancer Neg Hx    Social History   Tobacco Use  . Smoking status: Never Smoker  . Smokeless tobacco: Never Used  . Tobacco comment: never used tobacco  Substance Use Topics  . Alcohol use: Yes    Alcohol/week: 0.0 standard drinks    Comment: rarely   . Drug use: No   Current Outpatient Medications  Medication Sig Dispense Refill  . amLODipine (NORVASC)  5 MG tablet Take 5 mg by mouth daily.    [name] buPROPion (WELLBUTRIN XL) 150 MG 24 hr tablet Take 150 mg by mouth daily. Take 2 tablets total of 300 mg in the morning.  5  . [facility] B-12 500 MCG SUBL Place 1 tablet under the tongue daily with breakfast.   12  . desmopressin (DDAVP) 0.2 MG tablet Take 0.4 mg by mouth at bedtime.     [name] glimepiride (AMARYL) 2 MG tablet Take 2-4 mg by mouth 2 (two) times daily. Take 67ms in the morning and 274m at night    . losartan-hydrochlorothiazide (HYZAAR) 100-12.5 MG tablet Take 1 tablet by mouth daily.    . metFORMIN (GLUCOPHAGE-XR) 500 MG 24 hr tablet Take 500 mg by mouth 2 (two) times daily.     . Marland Kitchenoxifloxacin (VIGAMOX) 0.5 %  ophthalmic solution PLACE 1 DROP IN RIGHT EYE FOUR TIMES A DAY USE THE DAY BEFORE, DAY OF, AND DAY AFTER TREATMENT.  5  . ONE TOUCH ULTRA TEST test strip by Other route as needed.     . pantoprazole (PROTONIX) 40 MG tablet TAKE 1 TABLET (40 MG TOTAL) BY MOUTH DAILY BEFORE BREAKFAST (MAX DAYS #30 ON INS) 30 tablet 1  . PRESCRIPTION MEDICATION every 8 (eight) weeks. Gets injections in right eye at dr's office every 8 weeks    . rosuvastatin (CRESTOR) 20 MG tablet Take 20 mg by mouth at bedtime.     [name] testosterone cypionate (DEPOTESTOSTERONE CYPIONATE) 200 MG/ML injection Inject into the muscle every 21 ( twenty-one) days.     [name] VIAGRA 100 MG tablet Take 100 mg by mouth daily as needed for erectile dysfunction.   1  . XARELTO 10 MG TABS tablet TAKE 1 TABLET BY MOUTH EVERY DAY 30 tablet 11  . zolpidem (AMBIEN) 10 MG tablet Take 10 mg by mouth at bedtime as needed for sleep.     No current facility-administered medications for this visit.    Allergies  Allergen Reactions  . Ibuprofen Other (See Comments)    Upper GI Bled  . Nsaids      Review of Systems: All systems reviewed and negative except where noted in HPI.   Lab Results  Component Value Date   WBC 7.7 11/04/2018   HGB 12.8 (L) 11/04/2018   HCT 46.8 11/04/2018   MCV 69.9 (L)  11/04/2018   PLT 361 11/04/2018    Lab Results  Component Value Date   IRON 24 (L) 11/04/2018   TIBC 451 (H) 11/04/2018   FERRITIN 5 (L) 11/04/2018    Lab Results  Component Value Date   CREATININE 1.47 (H) 11/04/2018   BUN 15 11/04/2018   NA 140 11/04/2018   K 3.9 11/04/2018   CL 104 11/04/2018   CO2 26 11/04/2018    Lab Results  Component Value Date   ALT 24 11/04/2018   AST 26 11/04/2018   ALKPHOS 91 11/04/2018   BILITOT 0.7 11/04/2018     Physical Exam: BP (!) 142/70   Pulse 81   Ht 5' 6"  (1.676 m)   Wt 219 lb (99.3 kg)   SpO2 96%   BMI 35.35 kg/m  Constitutional: Pleasant,well-developed, male in no acute distress. HEENT: Normocephalic and atraumatic. Conjunctivae are normal. No scleral icterus. Neck supple.  Cardiovascular: Normal rate, regular rhythm.  Pulmonary/chest: Effort normal and breath sounds normal. No wheezing, rales or rhonchi. Abdominal: Soft, protuberant, nontender. . There are no masses palpable. No hepatomegaly. Extremities: no edema Lymphadenopathy: No cervical adenopathy noted. Neurological: Alert and oriented to person place and time. Skin: Skin is warm and dry. No rashes noted. Psychiatric: Normal mood and affect. Behavior is normal.   ASSESSMENT AND PLAN: 71 year old male here for reassessment of the following issues:  Iron deficiency anemia / history of colon polyps / anticoagulated - this has recurred on recent labs in the setting of anticoagulation. He denies any overt blood loss. He is compliant with Protonix and denies NSAID use. Discussed differential for him. We are not repleting iron given history of polycythemia if Hgb otherwise stable. Is possible he's had some oozing from small bowel AVMs noted on prior capsule. That being said he is due for surveillance colonoscopy at this time. Given his history of gastritis with concern for upper GI bleed in the past, I also offered him an EGD at  the time of his colonoscopy. I discussed the  risks and benefits of these procedures with him and he wanted to proceed. We will reach out to Dr. [name] if okay to hold Xarelto for 3 days prior to the procedures given his GFR. Okay to take aspirin in the peri-procedure time frame. He agreed, further recommendations pending the results  Portal / splenic vein / SMV thrombosis / omental infarcts - thought to be related to polycythemia in the past, continue with anticoagulation. CT in September shows stable changes.   Fatty liver - no evidence of cirrhosis based on prior liver biopsy, LFTs normal, will monitor  Caledonia Cellar, MD [facility]

## 2018-12-05 ENCOUNTER — Telehealth: Payer: Self-pay | Admitting: *Deleted

## 2018-12-05 NOTE — Telephone Encounter (Signed)
James [name],  I am getting this pt's chart ready for his procedure on 12-09-18.  Has he been notified about stopping his Xarelto?  Thanks, [employer]

## 2018-12-05 NOTE — Telephone Encounter (Signed)
Thanks very much for the quick response! Would it be okay if we held the Xarelto for 3 days before the procedure given his lower GFR? Happy holidays to you as well. Thanks

## 2018-12-05 NOTE — Telephone Encounter (Signed)
Called and LM on pt's cell phone for pt to call me regarding holding Xarelto starting tomorrow for procedure on Monday 12-09-18.  Called pt at work and left message to call back.   Pt called back and confirmed receipt of my calls and expressed understanding to hold Xarelto starting tomorrow, 12-20 for procedure on 12-23

## 2018-12-08 ENCOUNTER — Encounter: Payer: Self-pay | Admitting: Neurology

## 2018-12-09 ENCOUNTER — Ambulatory Visit (AMBULATORY_SURGERY_CENTER): Payer: 59 | Admitting: Gastroenterology

## 2018-12-09 ENCOUNTER — Encounter: Payer: Self-pay | Admitting: Gastroenterology

## 2018-12-09 VITALS — BP 105/78 | HR 78 | Temp 97.8°F | Resp 15 | Ht 66.0 in | Wt 219.0 lb

## 2018-12-09 DIAGNOSIS — Z86010 Personal history of colonic polyps: Secondary | ICD-10-CM

## 2018-12-09 DIAGNOSIS — D120 Benign neoplasm of cecum: Secondary | ICD-10-CM

## 2018-12-09 DIAGNOSIS — D509 Iron deficiency anemia, unspecified: Secondary | ICD-10-CM

## 2018-12-09 DIAGNOSIS — D122 Benign neoplasm of ascending colon: Secondary | ICD-10-CM

## 2018-12-09 MED ORDER — SODIUM CHLORIDE 0.9 % IV SOLN: 500.0000 mL | Freq: Once | INTRAVENOUS | Status: DC

## 2018-12-09 NOTE — Op Note (Signed)
[facility] Patient Name: James [name] Procedure Date: 12/09/2018 2:36 PM MRN: [id number] Endoscopist: Remo Lipps P. Havery Moros , MD Age: 72 Referring MD:  Date of Birth: [birth year] Gender: Male Account #: [id number] Procedure:                Colonoscopy Indications:              High risk colon cancer surveillance: Personal                            history of colonic polyps, iron deficiency anemia -                            on Xarelto Medicines:                Monitored Anesthesia Care Procedure:                Pre-Anesthesia Assessment:                           - Prior to the procedure, a History and Physical                            was performed, and patient medications and                            allergies were reviewed. The patient's tolerance of                            previous anesthesia was also reviewed. The risks                            and benefits of the procedure and the sedation                            options and risks were discussed with the patient.                            All questions were answered, and informed consent                            was obtained. Prior Anticoagulants: The patient has                            taken Xarelto (rivaroxaban), last dose was 3 days                            prior to procedure. ASA Grade Assessment: III - A                            patient with severe systemic disease. After                            reviewing the risks and benefits, the patient was  deemed in satisfactory condition to undergo the                            procedure.                           After obtaining informed consent, the colonoscope                            was passed under direct vision. Throughout the                            procedure, the patient's blood pressure, pulse, and                            oxygen saturations were monitored continuously. The   Colonoscope was introduced through the anus and                            advanced to the the terminal ileum, with                            identification of the appendiceal orifice and IC                            valve. The colonoscopy was performed without                            difficulty. The patient tolerated the procedure                            well. The quality of the bowel preparation was                            good. The terminal ileum, ileocecal valve,                            appendiceal orifice, and rectum were photographed. Scope In: 2:51:54 PM Scope Out: 3:14:03 PM Scope Withdrawal Time: 0 hours 16 minutes 47 seconds  Total Procedure Duration: 0 hours 22 minutes 9 seconds  Findings:                 The perianal and digital rectal examinations were                            normal.                           The terminal ileum appeared normal.                           A 2 to 3 mm polyp was found in the cecum. The polyp                            was sessile. The polyp was removed with a cold  snare. Resection and retrieval were complete.                           A 4 mm polyp was found in the ascending colon. The                            polyp was sessile. The polyp was removed with a                            cold snare. Resection and retrieval were complete.                           Multiple small-mouthed diverticula were found in                            the sigmoid colon.                           The exam was otherwise without abnormality. Complications:            No immediate complications. Estimated blood loss:                            Minimal. Estimated Blood Loss:     Estimated blood loss was minimal. Impression:               - The examined portion of the ileum was normal.                           - One 2 to 3 mm polyp in the cecum, removed with a                            cold snare. Resected and retrieved.                            - One 4 mm polyp in the ascending colon, removed                            with a cold snare. Resected and retrieved.                           - Diverticulosis in the sigmoid colon.                           - The examination was otherwise normal.                           No clear cause for iron deficiency on this exam or                            EGD, suspect small bowel etiology (AVMs seen on                            prior capsule exam) Recommendation:           -  Patient has a contact number available for                            emergencies. The signs and symptoms of potential                            delayed complications were discussed with the                            patient. Return to normal activities tomorrow.                            Written discharge instructions were provided to the                            patient.                           - Resume previous diet.                           - Continue present medications.                           - Resume Xarelto tomorrow                           - Await pathology results. Remo Lipps P. Armbruster, MD 12/09/2018 3:19:27 PM This report has been signed electronically.

## 2018-12-09 NOTE — Op Note (Signed)
[facility] Patient Name: James [name] Procedure Date: 12/09/2018 2:37 PM MRN: [id number] Endoscopist: Remo Lipps P. Havery Moros , MD Age: 72 Referring MD:  Date of Birth: March 20, 1946 Gender: Male Account #: [id number] Procedure:                Upper GI endoscopy Indications:              Iron deficiency anemia Medicines:                Monitored Anesthesia Care Procedure:                Pre-Anesthesia Assessment:                           - Prior to the procedure, a History and Physical                            was performed, and patient medications and                            allergies were reviewed. The patient's tolerance of                            previous anesthesia was also reviewed. The risks                            and benefits of the procedure and the sedation                            options and risks were discussed with the patient.                            All questions were answered, and informed consent                            was obtained. Prior Anticoagulants: The patient has                            taken Xarelto (rivaroxaban), last dose was 3 days                            prior to procedure. ASA Grade Assessment: III - A                            patient with severe systemic disease. After                            reviewing the risks and benefits, the patient was                            deemed in satisfactory condition to undergo the                            procedure.  After obtaining informed consent, the endoscope was                            passed under direct vision. Throughout the                            procedure, the patient's blood pressure, pulse, and                            oxygen saturations were monitored continuously. The                            Endoscope was introduced through the mouth, and                            advanced to the second part of duodenum. The upper                  GI endoscopy was accomplished without difficulty.                            The patient tolerated the procedure well. Scope In: Scope Out: Findings:                 Esophagogastric landmarks were identified: the                            Z-line was found at 38 cm, the gastroesophageal                            junction was found at 38 cm and the upper extent of                            the gastric folds was found at 38 cm from the                            incisors.                           The exam of the esophagus was otherwise normal.                           The entire examined stomach was normal.                           The duodenal bulb and second portion of the                            duodenum were normal. Complications:            No immediate complications. Estimated blood loss:                            None. Estimated Blood Loss:     Estimated blood loss: none. Impression:               -  Esophagogastric landmarks identified.                           - Normal esophagus                           - Normal stomach.                           - Normal duodenal bulb and second portion of the                            duodenum.                           No cause for iron deficiency on this exam Recommendation:           - Patient has a contact number available for                            emergencies. The signs and symptoms of potential                            delayed complications were discussed with the                            patient. Return to normal activities tomorrow.                            Written discharge instructions were provided to the                            patient.                           - Resume previous diet.                           - Continue present medications.                           - Resume Xarelto tomorrow per colonoscopy note Remo Lipps P. Armbruster, MD 12/09/2018 3:22:24 PM This report has been signed  electronically.

## 2018-12-09 NOTE — Progress Notes (Signed)
Called to room to assist during endoscopic procedure.  Patient ID and intended procedure confirmed with present staff. Received instructions for my participation in the procedure from the performing physician.  

## 2018-12-09 NOTE — Patient Instructions (Signed)
Information on polyps and diverticulosis given to you today.  Await pathology results.  [facility] [place] tomorrow - December 24.  YOU HAD AN ENDOSCOPIC PROCEDURE TODAY AT [facility]:   Refer to the procedure report that was given to you for any specific questions about what was found during the examination.  If the procedure report does not answer your questions, please call your gastroenterologist to clarify.  If you requested that your care partner not be given the details of your procedure findings, then the procedure report has been included in a sealed envelope for you to review at your convenience later.  YOU SHOULD EXPECT: Some feelings of bloating in the abdomen. Passage of more gas than usual.  Walking can help get rid of the air that was put into your GI tract during the procedure and reduce the bloating. If you had a lower endoscopy (such as a colonoscopy or flexible sigmoidoscopy) you may notice spotting of blood in your stool or on the toilet paper. If you underwent a bowel prep for your procedure, you may not have a normal bowel movement for a few days.  Please Note:  You might notice some irritation and congestion in your nose or some drainage.  This is from the oxygen used during your procedure.  There is no need for concern and it should clear up in a day or so.  SYMPTOMS TO REPORT IMMEDIATELY:   Following lower endoscopy (colonoscopy or flexible sigmoidoscopy):  Excessive amounts of blood in the stool  Significant tenderness or worsening of abdominal pains  Swelling of the abdomen that is new, acute  Fever of 100F or higher   Following upper endoscopy (EGD)  Vomiting of blood or coffee ground material  New chest pain or pain under the shoulder blades  Painful or persistently difficult swallowing  New shortness of breath  Fever of 100F or higher  Black, tarry-looking stools  For urgent or emergent issues, a gastroenterologist can be reached at any  hour by calling [phone].   DIET:  We do recommend a small meal at first, but then you may proceed to your regular diet.  Drink plenty of fluids but you should avoid alcoholic beverages for 24 hours.  ACTIVITY:  You should plan to take it easy for the rest of today and you should NOT DRIVE or use heavy machinery until tomorrow (because of the sedation medicines used during the test).    FOLLOW UP: Our staff will call the number listed on your records the next business day following your procedure to check on you and address any questions or concerns that you may have regarding the information given to you following your procedure. If we do not reach you, we will leave a message.  However, if you are feeling well and you are not experiencing any problems, there is no need to return our call.  We will assume that you have returned to your regular daily activities without incident.  If any biopsies were taken you will be contacted by phone or by letter within the next 1-3 weeks.  Please call us at [phone] if you have not heard about the biopsies in 3 weeks.    SIGNATURES/CONFIDENTIALITY: You and/or your care partner have signed paperwork which will be entered into your electronic medical record.  These signatures attest to the fact that that the information above on your After Visit Summary has been reviewed and is understood.  Full responsibility of the confidentiality of  this discharge information lies with you and/or your care-partner.

## 2018-12-10 ENCOUNTER — Telehealth: Payer: Self-pay | Admitting: *Deleted

## 2018-12-10 NOTE — Telephone Encounter (Signed)
  Follow up Call-  Call back number 12/09/2018  Post procedure Call Back phone  # [phone]  Permission to leave phone message Yes  Some recent data might be hidden     Patient questions:  Do you have a fever, pain , or abdominal swelling? No. Pain Score  0 *  Have you tolerated food without any problems? Yes.    Have you been able to return to your normal activities? Yes.    Do you have any questions about your discharge instructions: Diet   No. Medications  No. Follow up visit  No.  Do you have questions or concerns about your Care? No.  Actions: * If pain score is 4 or above: No action needed, pain <4.

## 2018-12-12 ENCOUNTER — Encounter: Payer: Self-pay | Admitting: Neurology

## 2018-12-12 ENCOUNTER — Ambulatory Visit: Payer: 59 | Admitting: Neurology

## 2018-12-12 VITALS — BP 132/77 | HR 98 | Ht 66.0 in | Wt 219.0 lb

## 2018-12-12 DIAGNOSIS — G4733 Obstructive sleep apnea (adult) (pediatric): Secondary | ICD-10-CM | POA: Diagnosis not present

## 2018-12-12 DIAGNOSIS — F322 Major depressive disorder, single episode, severe without psychotic features: Secondary | ICD-10-CM

## 2018-12-12 DIAGNOSIS — Z9989 Dependence on other enabling machines and devices: Secondary | ICD-10-CM | POA: Diagnosis not present

## 2018-12-12 NOTE — Progress Notes (Signed)
PATIENT: James [name]. DOB: 21-Sep-1946  REASON FOR VISIT: follow up HISTORY FROM: patient alone.   HISTORY OF PRESENT ILLNESS: HISTORY 10/29/15: James [name]. is a 72 y.o. male ,seen here as a referral from Dr. Philip Aspen  and upon recommendation of his Urologist for a sleep evaluation.  He was recently told by Dr. Sharlett Iles as well as by his urologist that his nocturia is not related to any prostate pathology that he had no trouble emptying his full bladder. Since he is also obese to Dr. Philip Aspen suggested to have a sleep apnea evaluation as a possible cause for the nocturia. The patient reports that he on some nights have restless legs. He often feels not restored or refreshed in the morning feeling like he didn't get enough sleep at night. He has some muscle cramps as well coughing and shortness of breath has been reported daytime fatigue and hearing loss. He has a history of hypertension, diabetes, obesity, and high cholesterol.   It is review of systems he also endorsed fatigue at 30 points the Epworth Sleepiness Scale at 13 points which indicates hypersomnia and the geriatric depression score at one point.   Dr. Philip Aspen also listed that the patient has hypogonadism, and is on testosterone replacements, he was diagnosed with vitamin B12 deficiency pernicious anemia but the level is still considered borderline and he was given or prescribed sublingual vitamin B12 tablets, he has allergic rhinitis which may interfere with CPAP use as needed. His diabetes is controlled on metformin and glimepiride.  He is also on Diovan which contains a diuretic and takes it in the morning. He has peripheral vascular disease but no claudications and suffered in the past from shingles and has postherpetic neuralgia he has polycythemia vera . Problem #1 on his list was insomnia which really seems to be related to nocturia as cause for  inability to maintain sleep. He goes to bed at 8 PM and is asleep by  8.05, he jokes. He falls asleep in church , in cinema and during TV or reading. He wakes at 4.30 and has been meanwhile up 4-5 times. His first nocturia is after 4 hours and then every 60- 90 minutes.   He doesn't nap  at work. 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  James [name] reports that he has a son that also has sleep apnea and is actually depending happily on the use of his CPAP machine.  He lost one son to suicide. His mother and aunts had sleepiness.   01-29-15 James [name] underwent a split night polysomnography on 11-26-14 after he had endorsed the Epworth sleepiness score at an elevated level of 13 points. He has a past medical history including polycythemia vera obesity dyspnea and hypertension and diabetes. In addition he had nocturia more than 3 times at night. He was diagnosed with an AHI of 24.8 and an RDI of 26.5 he slept not on his back but only on his side during the night in the sleep lab he also had very frequent periodic limb movements his PLM arousals were 18.5 per hour. He was titrated to 7 cm CPAP with a reduction of the AHI to 5.4 the now residual apneas were mostly central in nature. His PLM still continued as he was on CPAP. I see him today for his first visit after the sleep study he endorsed today the Epworth sleepiness score at 6 points which is quite reduced in comparison  and his fatigue severity at 19 points. He has been using an O2 sat CPAP machine with a minimum pressure of 5 cm water and a maximum pressure of 12 cm water the 95 st percentile of pressure is 11.2 He is 100% compliance for 30 days and 97% compliance for over 4 hours of use, average time of use of 6 hours 59 minutes EPR level 3 cm water. I would like for this patient to remain on this AutoSet machine-  his residual AHI is only 1.2 , there are no central apneas. He does have a mild air leak. He uses a nasal covering mask. He has some pressure marks around the nose and I  suggested to try and nasal pillow. He had not seen one before also the sleep lab staff is supposed to offer several options to the patient. He does like the res med airfit  P 10. I would like to add that the patient still has 3 nocturia is at night in spite of having sufficiently even wonderfully treated.  Interval history from 09-28-15, James [name] is here today for follow-up visit with an excellent compliance report. He has used the machine 100% of the last 30 days over 4 hours with an average user time of 7 hours and 54 minutes. He remains on AUTO set 5-12 cm water with 3 cm EPR. He has minor air leaks his 95th percentile pressure is 11.7. The AHI is 0.9. The patient reports sleeping through the night and having only 2 bathroom breaks down from 3 or 4 before being treated for OSA. He tolerates the CPAP well and his Epworth sleepiness score has decreased to 8 points his fatigue severity 27 points and he does not indicate a significant depression on his geriatric depression scale.   09/27/2016: James [name] is a 72 year old male with a history of obstructive sleep apnea on CPAP. He returns today for a compliance download. His download indicates that he uses machine 30 out of 30 days for compliance of 100%. Each night he uses machine greater than 4 hours. On average he uses his machine 7 hours and 30 minutes. His residual AHI is 0.6 on a minimum pressure of 5 cm water and maximum pressure of 12 cm water with EPR 3. His leak in the 95th percentile is 16.9 L/m. Overall he feels that he is doing well. He continues to use the fullface mask. His Epworth sleepiness score is 5. He returns today for an evaluation.  12-15-2018, RV--pleasure of meeting with James [name], James [name]. today and meanwhile 72 year old Caucasian gentleman with severe hearing loss and obstructive sleep apnea treated on CPAP.  He is here today for his routine compliance visit.[name]  He is also seen by Dr. Casimiro Needle for treatment of anxiety and  depression, he receives Wellbutrin and lorazepam as well as Ambien prescriptions through them. He has been on CPAP for over a decade. About 8 years ago his son died and he was without sleep for many days, treated there ever since.  The patient is as in the years before highly compliant, 30 out of 30 days use was 29 of those days over 4 consecutive hours average user time is 6 hours and 49 minutes, minimum pressure 5 maximum pressure on his AutoSet 12 cmH2O with 3 cm EPR and an AHI of 0.3.  No major air leaks the 95th percentile pressure is 10.9.  The machine is comfortable to use with the current set up, and he continues to use a  nasal mask   Interval history from 30 October 2016,  I have the pleasure of seeing James [name] today who has been a compliant CPAP user. Yesterday's compliance download showed 30 days was 97% compliance in hours an average of 7 hours and 38 minutes was reached, he is using an AutoSet between 5 and 12 cmH2O was 3 cm EPR his residual AHI is reduced to 0.5/h, is 95th percentile pressure is 11.7.  There are no central apneas emerging, his fatigue severity score is low at 17 point and his Epworth Sleepiness Scale was endorsed at six-points. He is very happy with CPAP and it's positive effects on alertness.     REVIEW OF SYSTEMS: Out of a complete 14 system review of symptoms, the patient complains only of the following symptoms, and all other reviewed systems are negative. See history of present illness.    Son died of suicide/ or accidential overdose of pain medication, had chronic pain and was bipolar. 2012.  [name]   ALLERGIES: Allergies  Allergen Reactions  . Ibuprofen Other (See Comments)    Upper GI Bled  . Nsaids     HOME MEDICATIONS: Outpatient Medications Prior to Visit  Medication Sig Dispense Refill  . amLODipine (NORVASC) 5 MG tablet Take 5 mg by mouth daily.    [name] buPROPion (WELLBUTRIN XL) 150 MG 24 hr tablet Take 150 mg by mouth daily. Take 2 tablets  total of 300 mg in the morning.  5  . [facility] B-12 500 MCG SUBL Place 2 tablets under the tongue daily with breakfast.   12  . desmopressin (DDAVP) 0.2 MG tablet Take 0.4 mg by mouth at bedtime.     [name] glimepiride (AMARYL) 2 MG tablet Take 2-4 mg by mouth 2 (two) times daily. Take 29ms in the morning and 228m at night    . LORazepam (ATIVAN) 0.5 MG tablet Take 0.5 mg by mouth 2 (two) times daily.  3  . losartan-hydrochlorothiazide (HYZAAR) 100-12.5 MG tablet Take 1 tablet by mouth daily.    . metFORMIN (GLUCOPHAGE-XR) 500 MG 24 hr tablet Take 500 mg by mouth 2 (two) times daily.     . Marland Kitchenoxifloxacin (VIGAMOX) 0.5 % ophthalmic solution PLACE 1 DROP IN RIGHT EYE FOUR TIMES A DAY USE THE DAY BEFORE, DAY OF, AND DAY AFTER TREATMENT.  5  . ONE TOUCH ULTRA TEST test strip by Other route as needed.     . pantoprazole (PROTONIX) 40 MG tablet TAKE 1 TABLET (40 MG TOTAL) BY MOUTH DAILY BEFORE BREAKFAST (MAX DAYS #30 ON INS) 30 tablet 1  . PRESCRIPTION MEDICATION every 8 (eight) weeks. Gets injections in right eye at dr's office every 8 weeks    . rosuvastatin (CRESTOR) 20 MG tablet Take 20 mg by mouth at bedtime.     . Marland Kitchenestosterone cypionate (DEPOTESTOSTERONE CYPIONATE) 200 MG/ML injection Inject into the muscle every 21 ( twenty-one) days.     . Marland KitchenIAGRA 100 MG tablet Take 100 mg by mouth daily as needed for erectile dysfunction.   1  . XARELTO 10 MG TABS tablet TAKE 1 TABLET BY MOUTH EVERY DAY 30 tablet 11  . zolpidem (AMBIEN) 10 MG tablet Take 10 mg by mouth at bedtime as needed for sleep.    . hydrochlorothiazide (HYDRODIURIL) 25 MG tablet TAKE 1 TABLET BY MOUTH EVERY DAY IN THE MORNING  2  . losartan (COZAAR) 100 MG tablet TAKE 1 TABLET BY MOUTH EVERY DAY IN THE MORNING  2   No facility-administered  medications prior to visit.     PAST MEDICAL HISTORY: Past Medical History:  Diagnosis Date  . Anxiety   . Arthritis   . Clotting disorder ([place])   . Depression   . DM (diabetes mellitus) ([place])   . GERD  (gastroesophageal reflux disease)   . Hiatal hernia   . History of shingles 04/03/2014  . HTN (hypertension)   . Hyperlipidemia   . Insomnia    resolved by using CPAP  . Iron deficiency anemia due to chronic blood loss 11/20/2016  . Macular degeneration   . Nocturia more than twice per night 11/11/2014   For 6 month, 5 nocturias a night.   . Obesity   . 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 Portal vein thrombosis   . Rhinitis   . Situational depression   . Sleep apnea    uses CPAP every night  . Tubular adenoma 12/10/2015   6 cecum polyps    PAST SURGICAL HISTORY: Past Surgical History:  Procedure Laterality Date  . CARDIAC CATHETERIZATION     greater 10 yrs ago, normal  . CARPAL TUNNEL RELEASE     bilateral  . COLONOSCOPY  06/2005   [facility] Dr Lajoyce Corners  . ESOPHAGOGASTRODUODENOSCOPY (EGD) WITH PROPOFOL N/A 09/28/2016   Procedure: ESOPHAGOGASTRODUODENOSCOPY (EGD) WITH PROPOFOL;  Surgeon: Gatha Mayer, MD;  Location: [facility];  Service: Endoscopy;  Laterality: N/A;  . IR GENERIC HISTORICAL  12/12/2016   IR US GUIDE VASC ACCESS RIGHT 12/12/2016 [name], MD [facility]  . IR GENERIC HISTORICAL  12/12/2016   IR VENOGRAM HEPATIC W HEMODYNAMIC EVALUATION 12/12/2016 [name], MD [facility]  . IR GENERIC HISTORICAL  12/12/2016   IR TRANSCATHETER BX 12/12/2016 [name], MD [facility]  . ROTATOR CUFF REPAIR     right  . TENDON REPAIR     left arm  . TONSILLECTOMY    . TOTAL KNEE ARTHROPLASTY Right 2010  . WISDOM TOOTH EXTRACTION      FAMILY HISTORY: Family History  Problem Relation Age of Onset  . Colon cancer Other   . Heart failure Other   . Heart attack Other   . Lung disease Father        history  . Diabetes Mother        history  . Stroke Mother        history  . Colon polyps Neg Hx   . Rectal cancer Neg Hx   . Stomach cancer Neg Hx   . Esophageal cancer Neg Hx     SOCIAL HISTORY: Social History   Socioeconomic History  . Marital  status: Married    Spouse name: Not on file  . Number of children: 1  . Years of education: Not on file  . Highest education level: Not on file  Occupational History  . Not on file  Social Needs  . Financial resource strain: Not on file  . Food insecurity:    Worry: Not on file    Inability: Not on file  . Transportation needs:    Medical: Not on file    Non-medical: Not on file  Tobacco Use  . Smoking status: Never Smoker  . Smokeless tobacco: Never Used  . Tobacco comment: never used tobacco  Substance and Sexual Activity  . Alcohol use: Yes    Alcohol/week: 0.0 standard drinks    Comment: rarely   . Drug use: No  . Sexual activity: Not on file  Lifestyle  . Physical  activity:    Days per week: Not on file    Minutes per session: Not on file  . Stress: Not on file  Relationships  . Social connections:    Talks on phone: Not on file    Gets together: Not on file    Attends religious service: Not on file    Active member of club or organization: Not on file    Attends meetings of clubs or organizations: Not on file    Relationship status: Not on file  . Intimate partner violence:    Fear of current or ex partner: Not on file    Emotionally abused: Not on file    Physically abused: Not on file    Forced sexual activity: Not on file  Other Topics Concern  . Not on file  Social History Narrative   Right handed.  Caffeine 3 cups daily,  Married 2 kids (one deceased).  College grad.  [employer].       PHYSICAL EXAM  Vitals:   12/12/18 1323  BP: 132/77  Pulse: 98  Weight: 219 lb (99.3 kg)  Height: 5' 6"  (1.676 m)   Body mass index is 35.35 kg/m.  Generalized: Well developed, in no acute distress, audible fast  breathing.  Nasal congestion  Neck: Circumference 16. 25  inches, Mallampati 3+,   Neurological examination  Mentation: Alert oriented to time, place, history taking.  Follows all commands speech and language  fluent, Cranial nerve : no changes in TASTE OR SMELL, Pupils were equal round reactive to light. The tongue is  midline. Head turning and shoulder shrug  were normal and symmetric. Hearing loss bilaterally  Motor: Full strength, no falls.  extremities,  symmetric motor tone is noted throughout.  Sensory: intact to soft touch in/on  all 4 extremities. No evidence of extinction is noted.   DIAGNOSTIC DATA (LABS, IMAGING, TESTING) - I reviewed patient records, labs, notes, testing and imaging myself where available.  No new labs to be reviewed.   ASSESSMENT AND PLAN  72 y.o. year old here with OSA on CPAP:  1. Obstructive sleep apnea on CPAP- 97% compliance , residual AHI 0.5 /hr.  CPAP Autotitraotion 5-12 cm water, 3 cm water EPR.    He will follow-up in one year withNP alternating with me   2. Nocturia , completely resolved to once a night.   3. Followed for polycythemia vera, which let to portal vein thrombosis- by Dr. [name] and Dr. Havery Moros.   4-. Last Monday negative  Colonoscopy and Endoscopy     I spent 20 minutes with the patient.     Larey Seat, MD  12/12/2018, 1:39 PM   [facility] [address] [place], [place] [address] [phone]

## 2018-12-12 NOTE — Patient Instructions (Signed)

## 2018-12-17 ENCOUNTER — Encounter: Payer: Self-pay | Admitting: Gastroenterology

## 2019-01-01 ENCOUNTER — Inpatient Hospital Stay: Payer: 59

## 2019-01-01 ENCOUNTER — Inpatient Hospital Stay: Payer: 59 | Attending: Hematology & Oncology | Admitting: Hematology & Oncology

## 2019-01-01 ENCOUNTER — Other Ambulatory Visit: Payer: Self-pay

## 2019-01-01 VITALS — BP 124/62 | HR 86 | Temp 98.3°F | Resp 16 | Wt 212.4 lb

## 2019-01-01 VITALS — BP 101/67 | HR 86 | Resp 18

## 2019-01-01 DIAGNOSIS — D500 Iron deficiency anemia secondary to blood loss (chronic): Secondary | ICD-10-CM

## 2019-01-01 DIAGNOSIS — E611 Iron deficiency: Secondary | ICD-10-CM | POA: Insufficient documentation

## 2019-01-01 DIAGNOSIS — D45 Polycythemia vera: Secondary | ICD-10-CM | POA: Diagnosis not present

## 2019-01-01 LAB — RETICULOCYTES
Immature Retic Fract: 23.9 % — ABNORMAL HIGH (ref 2.3–15.9)
RBC.: 6.94 MIL/uL — ABNORMAL HIGH (ref 4.22–5.81)
Retic Count, Absolute: 77.7 10*3/uL (ref 19.0–186.0)
Retic Ct Pct: 1.1 % (ref 0.4–3.1)

## 2019-01-01 LAB — CBC WITH DIFFERENTIAL (CANCER CENTER ONLY)
Abs Immature Granulocytes: 0.02 10*3/uL (ref 0.00–0.07)
BASOS ABS: 0.1 10*3/uL (ref 0.0–0.1)
BASOS PCT: 1 %
Eosinophils Absolute: 0.1 10*3/uL (ref 0.0–0.5)
Eosinophils Relative: 1 %
HCT: 47.4 % (ref 39.0–52.0)
Hemoglobin: 13.3 g/dL (ref 13.0–17.0)
Immature Granulocytes: 0 %
Lymphocytes Relative: 12 %
Lymphs Abs: 1.3 10*3/uL (ref 0.7–4.0)
MCH: 19.2 pg — ABNORMAL LOW (ref 26.0–34.0)
MCHC: 28.1 g/dL — ABNORMAL LOW (ref 30.0–36.0)
MCV: 68.3 fL — ABNORMAL LOW (ref 80.0–100.0)
Monocytes Absolute: 1.5 10*3/uL — ABNORMAL HIGH (ref 0.1–1.0)
Monocytes Relative: 14 %
Neutro Abs: 7.3 10*3/uL (ref 1.7–7.7)
Neutrophils Relative %: 72 %
PLATELETS: 453 10*3/uL — AB (ref 150–400)
RBC: 6.94 MIL/uL — AB (ref 4.22–5.81)
RDW: 22 % — ABNORMAL HIGH (ref 11.5–15.5)
WBC Count: 10.2 10*3/uL (ref 4.0–10.5)
nRBC: 0 % (ref 0.0–0.2)

## 2019-01-01 LAB — CMP (CANCER CENTER ONLY)
ALBUMIN: 4.5 g/dL (ref 3.5–5.0)
ALT: 19 U/L (ref 0–44)
AST: 19 U/L (ref 15–41)
Alkaline Phosphatase: 83 U/L (ref 38–126)
Anion gap: 7 (ref 5–15)
BUN: 21 mg/dL (ref 8–23)
CHLORIDE: 100 mmol/L (ref 98–111)
CO2: 25 mmol/L (ref 22–32)
Calcium: 10.6 mg/dL — ABNORMAL HIGH (ref 8.9–10.3)
Creatinine: 1.5 mg/dL — ABNORMAL HIGH (ref 0.61–1.24)
GFR, Est AFR Am: 53 mL/min — ABNORMAL LOW (ref >60)
GFR, Estimated: 46 mL/min — ABNORMAL LOW (ref >60)
Glucose, Bld: 167 mg/dL — ABNORMAL HIGH (ref 70–99)
Potassium: 3.9 mmol/L (ref 3.5–5.1)
SODIUM: 132 mmol/L — AB (ref 135–145)
Total Bilirubin: 0.6 mg/dL (ref 0.3–1.2)
Total Protein: 7.1 g/dL (ref 6.5–8.1)

## 2019-01-01 NOTE — Progress Notes (Signed)
Hematology and Oncology Follow Up Visit  James [name] [id number] 03-Jan-1946 73 y.o. 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 IV iron as indicated - last received in Decemeber 2017 x 2     Interim History:  James [name] is here today for a follow-up.  He is doing pretty well.  He got through the holidays without a lot of issues.  He has had no bleeding problems.  He has had no problems with nausea or vomiting.  He is on Xarelto.  There is been no abdominal pain.  When we last saw him, his iron studies showed iron deficiency but he was not anemic.  He feels as if he might need to have a phlebotomy today.  He does have a little bit of a headache.  There is been no problems with rashes.  He has had a little bit of leg swelling.  Overall, his performance status is ECOG 1.  Medications:  Allergies as of 01/01/2019      Reactions   Ibuprofen Other (See Comments)   Upper GI Bled   Nsaids       Medication List       Accurate as of January 01, 2019  4:19 PM. Always use your most recent med list.        amLODipine 5 MG tablet Commonly known as:  NORVASC Take 5 mg by mouth daily.   buPROPion 150 MG 24 hr tablet Commonly known as:  WELLBUTRIN XL Take 150 mg by mouth daily. Take 2 tablets total of 300 mg in the morning.   [facility] B-12 500 MCG Subl Generic drug:  Cyanocobalamin Place 2 tablets under the tongue daily with breakfast.   desmopressin 0.2 MG tablet Commonly known as:  DDAVP Take 0.4 mg by mouth at bedtime.   glimepiride 2 MG tablet Commonly known as:  AMARYL Take 2-4 mg by mouth 2 (two) times daily. Take 74ms in the morning and 281m at night   LORazepam 0.5 MG tablet Commonly known as:  ATIVAN Take 0.5 mg by mouth 2 (two) times daily.   losartan-hydrochlorothiazide 100-12.5 MG tablet Commonly  known as:  HYZAAR Take 1 tablet by mouth daily.   metFORMIN 500 MG 24 hr tablet Commonly known as:  GLUCOPHAGE-XR Take 500 mg by mouth 2 (two) times daily.   moxifloxacin 0.5 % ophthalmic solution Commonly known as:  VIGAMOX PLACE 1 DROP IN RIGHT EYE FOUR TIMES A DAY USE THE DAY BEFORE, DAY OF, AND DAY AFTER TREATMENT.   ONE TOUCH ULTRA TEST test strip Generic drug:  glucose blood by Other route as needed.   pantoprazole 40 MG tablet Commonly known as:  PROTONIX TAKE 1 TABLET (40 MG TOTAL) BY MOUTH DAILY BEFORE BREAKFAST (MAX DAYS #30 ON INS)   PRESCRIPTION MEDICATION every 8 (eight) weeks. Gets injections in right eye at dr's office every 8 weeks   rosuvastatin 20 MG tablet Commonly known as:  CRESTOR Take 20 mg by mouth at bedtime.   testosterone cypionate 200 MG/ML injection Commonly known as:  DEPOTESTOSTERONE CYPIONATE Inject into the muscle every 21 ( twenty-one) days.   VIAGRA 100 MG tablet Generic drug:  sildenafil Take 100 mg by mouth daily as needed for erectile dysfunction.   XARELTO 10 MG Tabs tablet Generic drug:  rivaroxaban TAKE 1 TABLET BY MOUTH EVERY DAY  zolpidem 10 MG tablet Commonly known as:  AMBIEN Take 10 mg by mouth at bedtime as needed for sleep.       Allergies:  Allergies  Allergen Reactions  . Ibuprofen Other (See Comments)    Upper GI Bled  . Nsaids     Past Medical History, Surgical history, Social history, and Family History were reviewed and updated.  Review of Systems: Review of Systems  Constitutional: Negative.   HENT: Negative.   Eyes: Negative.   Respiratory: Negative.   Cardiovascular: Negative.   Gastrointestinal: Negative.   Genitourinary: Negative.   Musculoskeletal: Negative.   Skin: Negative.   Neurological: Negative.   Endo/Heme/Allergies: Negative.   Psychiatric/Behavioral: Negative.     Physical Exam:  weight is 212 lb 6.4 oz (96.3 kg). His oral temperature is 98.3 F (36.8 C). His blood pressure  is 124/62 and his pulse is 86. His respiration is 16 and oxygen saturation is 94%.   Wt Readings from Last 3 Encounters:  01/01/19 212 lb 6.4 oz (96.3 kg)  12/12/18 219 lb (99.3 kg)  12/09/18 219 lb (99.3 kg)    Physical Exam Vitals signs reviewed.  HENT:     Head: Normocephalic and atraumatic.  Eyes:     Pupils: Pupils are equal, round, and reactive to light.  Neck:     Musculoskeletal: Normal range of motion.  Cardiovascular:     Rate and Rhythm: Normal rate and regular rhythm.     Heart sounds: Normal heart sounds.  Pulmonary:     Effort: Pulmonary effort is normal.     Breath sounds: Normal breath sounds.  Abdominal:     General: Bowel sounds are normal.     Palpations: Abdomen is soft.  Musculoskeletal: Normal range of motion.        General: No tenderness or deformity.  Lymphadenopathy:     Cervical: No cervical adenopathy.  Skin:    General: Skin is warm and dry.     Findings: No erythema or rash.  Neurological:     Mental Status: He is alert and oriented to person, place, and time.  Psychiatric:        Behavior: Behavior normal.        Thought Content: Thought content normal.        Judgment: Judgment normal.      Lab Results  Component Value Date   WBC 10.2 01/01/2019   HGB 13.3 01/01/2019   HCT 47.4 01/01/2019   MCV 68.3 (L) 01/01/2019   PLT 453 (H) 01/01/2019   Lab Results  Component Value Date   FERRITIN 5 (L) 11/04/2018   IRON 24 (L) 11/04/2018   TIBC 451 (H) 11/04/2018   UIBC 427 (H) 11/04/2018   IRONPCTSAT 5 (L) 11/04/2018   Lab Results  Component Value Date   RETICCTPCT 1.1 01/01/2019   RBC 6.94 (H) 01/01/2019   RBC 6.94 (H) 01/01/2019   RETICCTABS 94.0 08/30/2011   No results found for: Nils Pyle, [facility] Lab Results  Component Value Date   IGGSERUM 828 10/11/2016   IGA 177 06/22/2016   No results found for: Ronnald Ramp, A1GS, A2GS, BETS, BETA2SER, GAMS, MSPIKE, SPEI   Chemistry      Component  Value Date/Time   NA 132 (L) 01/01/2019 1359   NA 137 10/29/2017 1307   NA 134 (L) 05/10/2017 0755   K 3.9 01/01/2019 1359   K 3.6 10/29/2017 1307   K 4.2 05/10/2017 0755   CL 100 01/01/2019 1359  CL 101 10/29/2017 1307   CO2 25 01/01/2019 1359   CO2 25 10/29/2017 1307   CO2 20 (L) 05/10/2017 0755   BUN 21 01/01/2019 1359   BUN 14 10/29/2017 1307   BUN 21.0 05/10/2017 0755   CREATININE 1.50 (H) 01/01/2019 1359   CREATININE 1.4 (H) 10/29/2017 1307   CREATININE 1.3 05/10/2017 0755      Component Value Date/Time   CALCIUM 10.6 (H) 01/01/2019 1359   CALCIUM 10.3 10/29/2017 1307   CALCIUM 10.3 05/10/2017 0755   ALKPHOS 83 01/01/2019 1359   ALKPHOS 77 10/29/2017 1307   ALKPHOS 97 05/10/2017 0755   AST 19 01/01/2019 1359   AST 25 05/10/2017 0755   ALT 19 01/01/2019 1359   ALT 27 10/29/2017 1307   ALT 26 05/10/2017 0755   BILITOT 0.6 01/01/2019 1359   BILITOT 0.70 05/10/2017 0755     Impression and Plan: Mr. Kenyon is 73 year old gentleman with polycythemia. He is JAK2 negative.  We will definitely phlebotomize him today.  I think this will be helpful.  I think will make him feel better.  I will plan to get him back in another couple months for follow-up.  He will stay on his Xarelto.  [name], MD 1/15/20204:19 PM

## 2019-01-01 NOTE — Patient Instructions (Signed)

## 2019-01-01 NOTE — Progress Notes (Signed)
James [name]. presents today for phlebotomy per MD orders. Phlebotomy procedure started at 1618 and ended at 1625. 520 cc removed via 16 G needle at R antecubital site. Patient tolerated procedure well.

## 2019-01-02 LAB — IRON AND TIBC
Iron: 21 ug/dL — ABNORMAL LOW (ref 42–163)
Saturation Ratios: 5 % — ABNORMAL LOW (ref 20–55)
TIBC: 423 ug/dL — AB (ref 202–409)
UIBC: 401 ug/dL — ABNORMAL HIGH (ref 117–376)

## 2019-01-02 LAB — FERRITIN: Ferritin: 10 ng/mL — ABNORMAL LOW (ref 24–336)

## 2019-01-19 ENCOUNTER — Other Ambulatory Visit: Payer: Self-pay | Admitting: Cardiology

## 2019-01-19 DIAGNOSIS — R0789 Other chest pain: Secondary | ICD-10-CM

## 2019-01-19 DIAGNOSIS — I10 Essential (primary) hypertension: Secondary | ICD-10-CM

## 2019-01-20 ENCOUNTER — Other Ambulatory Visit: Payer: Self-pay | Admitting: Cardiology

## 2019-01-20 DIAGNOSIS — E7800 Pure hypercholesterolemia, unspecified: Secondary | ICD-10-CM

## 2019-01-20 DIAGNOSIS — R0789 Other chest pain: Secondary | ICD-10-CM

## 2019-01-20 DIAGNOSIS — I10 Essential (primary) hypertension: Secondary | ICD-10-CM

## 2019-01-22 ENCOUNTER — Other Ambulatory Visit: Payer: Self-pay | Admitting: Gastroenterology

## 2019-01-31 ENCOUNTER — Ambulatory Visit: Payer: 59

## 2019-01-31 DIAGNOSIS — R0789 Other chest pain: Secondary | ICD-10-CM

## 2019-01-31 DIAGNOSIS — I10 Essential (primary) hypertension: Secondary | ICD-10-CM

## 2019-01-31 DIAGNOSIS — E7800 Pure hypercholesterolemia, unspecified: Secondary | ICD-10-CM

## 2019-02-04 ENCOUNTER — Telehealth: Payer: Self-pay

## 2019-02-04 NOTE — Telephone Encounter (Signed)
-----   Message from Adrian Prows, MD sent at 02/02/2019  9:43 PM EST ----- Normal stress

## 2019-02-04 NOTE — Telephone Encounter (Signed)
Pt aware.

## 2019-02-06 ENCOUNTER — Other Ambulatory Visit: Payer: Self-pay

## 2019-02-12 ENCOUNTER — Other Ambulatory Visit: Payer: Self-pay

## 2019-02-18 ENCOUNTER — Other Ambulatory Visit: Payer: Self-pay

## 2019-02-26 ENCOUNTER — Ambulatory Visit: Payer: Self-pay | Admitting: Cardiology

## 2019-03-03 ENCOUNTER — Inpatient Hospital Stay: Payer: 59

## 2019-03-03 ENCOUNTER — Other Ambulatory Visit: Payer: Self-pay

## 2019-03-03 ENCOUNTER — Inpatient Hospital Stay: Payer: 59 | Attending: Hematology & Oncology | Admitting: Hematology & Oncology

## 2019-03-03 ENCOUNTER — Encounter: Payer: Self-pay | Admitting: Hematology & Oncology

## 2019-03-03 VITALS — BP 105/60 | HR 80 | Temp 98.0°F | Resp 18 | Wt 212.0 lb

## 2019-03-03 DIAGNOSIS — D45 Polycythemia vera: Secondary | ICD-10-CM

## 2019-03-03 DIAGNOSIS — Z7901 Long term (current) use of anticoagulants: Secondary | ICD-10-CM

## 2019-03-03 LAB — CBC WITH DIFFERENTIAL (CANCER CENTER ONLY)
ABS IMMATURE GRANULOCYTES: 0.03 10*3/uL (ref 0.00–0.07)
Basophils Absolute: 0.1 10*3/uL (ref 0.0–0.1)
Basophils Relative: 1 %
Eosinophils Absolute: 0.1 10*3/uL (ref 0.0–0.5)
Eosinophils Relative: 1 %
HCT: 41.4 % (ref 39.0–52.0)
Hemoglobin: 12 g/dL — ABNORMAL LOW (ref 13.0–17.0)
Immature Granulocytes: 0 %
LYMPHS ABS: 1.2 10*3/uL (ref 0.7–4.0)
Lymphocytes Relative: 15 %
MCH: 21.4 pg — ABNORMAL LOW (ref 26.0–34.0)
MCHC: 29 g/dL — ABNORMAL LOW (ref 30.0–36.0)
MCV: 73.7 fL — ABNORMAL LOW (ref 80.0–100.0)
Monocytes Absolute: 0.9 10*3/uL (ref 0.1–1.0)
Monocytes Relative: 12 %
Neutro Abs: 5.2 10*3/uL (ref 1.7–7.7)
Neutrophils Relative %: 71 %
Platelet Count: 352 10*3/uL (ref 150–400)
RBC: 5.62 MIL/uL (ref 4.22–5.81)
RDW: 25.1 % — ABNORMAL HIGH (ref 11.5–15.5)
WBC Count: 7.5 10*3/uL (ref 4.0–10.5)
nRBC: 0 % (ref 0.0–0.2)

## 2019-03-03 LAB — CMP (CANCER CENTER ONLY)
ALBUMIN: 4.4 g/dL (ref 3.5–5.0)
ALT: 33 U/L (ref 0–44)
AST: 26 U/L (ref 15–41)
Alkaline Phosphatase: 74 U/L (ref 38–126)
Anion gap: 8 (ref 5–15)
BUN: 20 mg/dL (ref 8–23)
CALCIUM: 10.2 mg/dL (ref 8.9–10.3)
CO2: 24 mmol/L (ref 22–32)
Chloride: 104 mmol/L (ref 98–111)
Creatinine: 1.16 mg/dL (ref 0.61–1.24)
GFR, Est AFR Am: >60 mL/min (ref >60)
GFR, Estimated: >60 mL/min (ref >60)
GLUCOSE: 176 mg/dL — AB (ref 70–99)
Potassium: 4 mmol/L (ref 3.5–5.1)
Sodium: 136 mmol/L (ref 135–145)
Total Bilirubin: 0.6 mg/dL (ref 0.3–1.2)
Total Protein: 6.6 g/dL (ref 6.5–8.1)

## 2019-03-03 NOTE — Progress Notes (Signed)
Hematology and Oncology Follow Up Visit  James [name] [id number] July 29, 1946 73 y.o. 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 IV iron as indicated - last received in January 2020      Interim History:  James [name] is here today for a follow-up.  He is doing pretty well.  He feels pretty well.  We also gave him some iron last time he was here.  Now that we had to because his ferritin was only 10 with an iron saturation of 5%.  He is not noted any bleeding.  He has had no abdominal pain.  He still stays on Xarelto.  He is doing pretty well on the Xarelto.  He has had no cough or shortness of breath.  He has had no fever.  So far, the fact that all the sports events have not been canceled really does not bother him.  Currently, his performance status is ECOG 1.  Medications:  Allergies as of 03/03/2019      Reactions   Ibuprofen Other (See Comments)   Upper GI Bled   Nsaids       Medication List       Accurate as of March 03, 2019  4:00 PM. Always use your most recent med list.        amLODipine 5 MG tablet Commonly known as:  NORVASC Take 5 mg by mouth daily.   buPROPion 150 MG 24 hr tablet Commonly known as:  WELLBUTRIN XL Take 150 mg by mouth daily. Take 2 tablets total of 300 mg in the morning.   [facility] B-12 500 MCG Subl Generic drug:  Cyanocobalamin Place 2 tablets under the tongue daily with breakfast.   desmopressin 0.2 MG tablet Commonly known as:  DDAVP Take 0.4 mg by mouth at bedtime.   glimepiride 2 MG tablet Commonly known as:  AMARYL Take 2-4 mg by mouth 2 (two) times daily. Take 66ms in the morning and 254m at night   LORazepam 0.5 MG tablet Commonly known as:  ATIVAN Take 0.5 mg by mouth 2 (two) times daily.   losartan-hydrochlorothiazide 100-12.5 MG tablet Commonly known as:   HYZAAR Take 1 tablet by mouth daily.   metFORMIN 500 MG 24 hr tablet Commonly known as:  GLUCOPHAGE-XR Take 500 mg by mouth 2 (two) times daily.   moxifloxacin 0.5 % ophthalmic solution Commonly known as:  VIGAMOX PLACE 1 DROP IN RIGHT EYE FOUR TIMES A DAY USE THE DAY BEFORE, DAY OF, AND DAY AFTER TREATMENT.   ONE TOUCH ULTRA TEST test strip Generic drug:  glucose blood by Other route as needed.   pantoprazole 40 MG tablet Commonly known as:  PROTONIX TAKE 1 TABLET (40 MG TOTAL) BY MOUTH DAILY BEFORE BREAKFAST (MAX DAYS #30 ON INS)   PRESCRIPTION MEDICATION every 8 (eight) weeks. Gets injections in right eye at dr's office every 8 weeks   rosuvastatin 20 MG tablet Commonly known as:  CRESTOR Take 20 mg by mouth at bedtime.   testosterone cypionate 200 MG/ML injection Commonly known as:  DEPOTESTOSTERONE CYPIONATE Inject into the muscle every 21 ( twenty-one) days.   Viagra 100 MG tablet Generic drug:  sildenafil Take 100 mg by mouth daily as needed for erectile dysfunction.   Xarelto 10 MG Tabs tablet Generic drug:  rivaroxaban TAKE 1 TABLET BY MOUTH EVERY DAY  zolpidem 10 MG tablet Commonly known as:  AMBIEN Take 10 mg by mouth at bedtime as needed for sleep.       Allergies:  Allergies  Allergen Reactions  . Ibuprofen Other (See Comments)    Upper GI Bled  . Nsaids     Past Medical History, Surgical history, Social history, and Family History were reviewed and updated.  Review of Systems: Review of Systems  Constitutional: Negative.   HENT: Negative.   Eyes: Negative.   Respiratory: Negative.   Cardiovascular: Negative.   Gastrointestinal: Negative.   Genitourinary: Negative.   Musculoskeletal: Negative.   Skin: Negative.   Neurological: Negative.   Endo/Heme/Allergies: Negative.   Psychiatric/Behavioral: Negative.     Physical Exam:  weight is 212 lb (96.2 kg). His oral temperature is 98 F (36.7 C). His blood pressure is 105/60 and his  pulse is 80. His respiration is 18 and oxygen saturation is 95%.   Wt Readings from Last 3 Encounters:  03/03/19 212 lb (96.2 kg)  01/01/19 212 lb 6.4 oz (96.3 kg)  12/12/18 219 lb (99.3 kg)    Physical Exam Vitals signs reviewed.  HENT:     Head: Normocephalic and atraumatic.  Eyes:     Pupils: Pupils are equal, round, and reactive to light.  Neck:     Musculoskeletal: Normal range of motion.  Cardiovascular:     Rate and Rhythm: Normal rate and regular rhythm.     Heart sounds: Normal heart sounds.  Pulmonary:     Effort: Pulmonary effort is normal.     Breath sounds: Normal breath sounds.  Abdominal:     General: Bowel sounds are normal.     Palpations: Abdomen is soft.  Musculoskeletal: Normal range of motion.        General: No tenderness or deformity.  Lymphadenopathy:     Cervical: No cervical adenopathy.  Skin:    General: Skin is warm and dry.     Findings: No erythema or rash.  Neurological:     Mental Status: He is alert and oriented to person, place, and time.  Psychiatric:        Behavior: Behavior normal.        Thought Content: Thought content normal.        Judgment: Judgment normal.      Lab Results  Component Value Date   WBC 7.5 03/03/2019   HGB 12.0 (L) 03/03/2019   HCT 41.4 03/03/2019   MCV 73.7 (L) 03/03/2019   PLT 352 03/03/2019   Lab Results  Component Value Date   FERRITIN 10 (L) 01/01/2019   IRON 21 (L) 01/01/2019   TIBC 423 (H) 01/01/2019   UIBC 401 (H) 01/01/2019   IRONPCTSAT 5 (L) 01/01/2019   Lab Results  Component Value Date   RETICCTPCT 1.1 01/01/2019   RBC 5.62 03/03/2019   RETICCTABS 94.0 08/30/2011   No results found for: Nils Pyle, [facility] Lab Results  Component Value Date   IGGSERUM 828 10/11/2016   IGA 177 06/22/2016   No results found for: Odetta Pink, SPEI   Chemistry      Component Value Date/Time   NA 136 03/03/2019 1427   NA  137 10/29/2017 1307   NA 134 (L) 05/10/2017 0755   K 4.0 03/03/2019 1427   K 3.6 10/29/2017 1307   K 4.2 05/10/2017 0755   CL 104 03/03/2019 1427   CL 101 10/29/2017 1307   CO2 24 03/03/2019 1427  CO2 25 10/29/2017 1307   CO2 20 (L) 05/10/2017 0755   BUN 20 03/03/2019 1427   BUN 14 10/29/2017 1307   BUN 21.0 05/10/2017 0755   CREATININE 1.16 03/03/2019 1427   CREATININE 1.4 (H) 10/29/2017 1307   CREATININE 1.3 05/10/2017 0755      Component Value Date/Time   CALCIUM 10.2 03/03/2019 1427   CALCIUM 10.3 10/29/2017 1307   CALCIUM 10.3 05/10/2017 0755   ALKPHOS 74 03/03/2019 1427   ALKPHOS 77 10/29/2017 1307   ALKPHOS 97 05/10/2017 0755   AST 26 03/03/2019 1427   AST 25 05/10/2017 0755   ALT 33 03/03/2019 1427   ALT 27 10/29/2017 1307   ALT 26 05/10/2017 0755   BILITOT 0.6 03/03/2019 1427   BILITOT 0.70 05/10/2017 0755     Impression and Plan: Mr. Kittell is 73 year old gentleman with polycythemia. He is JAK2 negative.  He does not need to be phlebotomized today.  This is a true blessing.  We will see him back now in a couple months.  I think we have the luxury of going 2 or 3 months before we see him back.  [name], MD 3/16/20204:00 PM

## 2019-03-04 LAB — IRON AND TIBC
Iron: 27 ug/dL — ABNORMAL LOW (ref 42–163)
SATURATION RATIOS: 6 % — AB (ref 20–55)
TIBC: 418 ug/dL — AB (ref 202–409)
UIBC: 391 ug/dL — ABNORMAL HIGH (ref 117–376)

## 2019-03-04 LAB — FERRITIN: Ferritin: 28 ng/mL (ref 24–336)

## 2019-03-10 ENCOUNTER — Other Ambulatory Visit: Payer: Self-pay

## 2019-03-12 ENCOUNTER — Other Ambulatory Visit: Payer: Self-pay

## 2019-03-19 ENCOUNTER — Ambulatory Visit: Payer: Self-pay | Admitting: Cardiology

## 2019-04-14 ENCOUNTER — Other Ambulatory Visit: Payer: Self-pay

## 2019-04-17 ENCOUNTER — Other Ambulatory Visit: Payer: Self-pay

## 2019-04-17 ENCOUNTER — Ambulatory Visit (INDEPENDENT_AMBULATORY_CARE_PROVIDER_SITE_OTHER): Payer: 59

## 2019-04-17 DIAGNOSIS — R0789 Other chest pain: Secondary | ICD-10-CM | POA: Diagnosis not present

## 2019-04-17 DIAGNOSIS — I10 Essential (primary) hypertension: Secondary | ICD-10-CM | POA: Diagnosis not present

## 2019-04-23 ENCOUNTER — Other Ambulatory Visit: Payer: Self-pay

## 2019-05-14 ENCOUNTER — Other Ambulatory Visit: Payer: Self-pay

## 2019-05-14 ENCOUNTER — Inpatient Hospital Stay: Payer: 59

## 2019-05-14 ENCOUNTER — Encounter: Payer: Self-pay | Admitting: Hematology & Oncology

## 2019-05-14 ENCOUNTER — Inpatient Hospital Stay: Payer: 59 | Attending: Hematology & Oncology | Admitting: Hematology & Oncology

## 2019-05-14 VITALS — BP 143/74 | HR 83 | Temp 98.0°F | Resp 17 | Wt 213.1 lb

## 2019-05-14 DIAGNOSIS — D45 Polycythemia vera: Secondary | ICD-10-CM | POA: Diagnosis present

## 2019-05-14 LAB — CBC WITH DIFFERENTIAL (CANCER CENTER ONLY)
Abs Immature Granulocytes: 0.04 10*3/uL (ref 0.00–0.07)
Basophils Absolute: 0.1 10*3/uL (ref 0.0–0.1)
Basophils Relative: 1 %
Eosinophils Absolute: 0.1 10*3/uL (ref 0.0–0.5)
Eosinophils Relative: 2 %
HCT: 41.5 % (ref 39.0–52.0)
Hemoglobin: 12.8 g/dL — ABNORMAL LOW (ref 13.0–17.0)
Immature Granulocytes: 1 %
Lymphocytes Relative: 15 %
Lymphs Abs: 1.2 10*3/uL (ref 0.7–4.0)
MCH: 22.9 pg — ABNORMAL LOW (ref 26.0–34.0)
MCHC: 30.8 g/dL (ref 30.0–36.0)
MCV: 74.2 fL — ABNORMAL LOW (ref 80.0–100.0)
Monocytes Absolute: 1.3 10*3/uL — ABNORMAL HIGH (ref 0.1–1.0)
Monocytes Relative: 16 %
Neutro Abs: 5.3 10*3/uL (ref 1.7–7.7)
Neutrophils Relative %: 65 %
Platelet Count: 318 10*3/uL (ref 150–400)
RBC: 5.59 MIL/uL (ref 4.22–5.81)
RDW: 18.9 % — ABNORMAL HIGH (ref 11.5–15.5)
WBC Count: 8.1 10*3/uL (ref 4.0–10.5)
nRBC: 0 % (ref 0.0–0.2)

## 2019-05-14 LAB — CMP (CANCER CENTER ONLY)
ALT: 23 U/L (ref 0–44)
AST: 24 U/L (ref 15–41)
Albumin: 4.3 g/dL (ref 3.5–5.0)
Alkaline Phosphatase: 89 U/L (ref 38–126)
Anion gap: 10 (ref 5–15)
BUN: 16 mg/dL (ref 8–23)
CO2: 23 mmol/L (ref 22–32)
Calcium: 10 mg/dL (ref 8.9–10.3)
Chloride: 103 mmol/L (ref 98–111)
Creatinine: 1.15 mg/dL (ref 0.61–1.24)
GFR, Est AFR Am: >60 mL/min (ref >60)
GFR, Estimated: >60 mL/min (ref >60)
Glucose, Bld: 270 mg/dL — ABNORMAL HIGH (ref 70–99)
Potassium: 3.4 mmol/L — ABNORMAL LOW (ref 3.5–5.1)
Sodium: 136 mmol/L (ref 135–145)
Total Bilirubin: 0.5 mg/dL (ref 0.3–1.2)
Total Protein: 6.9 g/dL (ref 6.5–8.1)

## 2019-05-14 LAB — RETICULOCYTES
Immature Retic Fract: 30.3 % — ABNORMAL HIGH (ref 2.3–15.9)
RBC.: 5.58 MIL/uL (ref 4.22–5.81)
Retic Count, Absolute: 83.7 10*3/uL (ref 19.0–186.0)
Retic Ct Pct: 1.5 % (ref 0.4–3.1)

## 2019-05-14 NOTE — Progress Notes (Signed)
Hematology and Oncology Follow Up Visit  James [name] [id number] 06/22/1946 73 y.o. 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 IV iron as indicated - last received in January 2020      Interim History:  Mr. Schear is here today for a follow-up.  He is doing okay.  He else is affected by the coronavirus.  He is now working from home.  Even worse is the fact that they his company wants him to retire.  He will retire at the end of the year.  He really does not want to retire but sounds like he has no choice.  Otherwise is been doing okay.  He is on the maintenance Xarelto and doing well with this.  He has had no bleeding.  He has had no change in bowel or bladder habits.  He has had no leg swelling.  He is not been able to really exercise.  He does like to swim but with the coronavirus, has not been able to.  His last iron studies that were done back in February showed a ferritin of 28 with an iron saturation of 6%.  Currently, his performance status is ECOG 1.  Medications:  Allergies as of 05/14/2019      Reactions   Ibuprofen Other (See Comments)   Upper GI Bled   Nsaids       Medication List       Accurate as of May 14, 2019  4:13 PM. If you have any questions, ask your nurse or doctor.        amLODipine 5 MG tablet Commonly known as:  NORVASC Take 5 mg by mouth daily.   buPROPion 150 MG 24 hr tablet Commonly known as:  WELLBUTRIN XL Take 150 mg by mouth daily. Take 2 tablets total of 300 mg in the morning.   [employer] B-12 500 MCG Subl Generic drug:  Cyanocobalamin Place 2 tablets under the tongue daily with breakfast.   desmopressin 0.2 MG tablet Commonly known as:  DDAVP Take 0.4 mg by mouth at bedtime.   glimepiride 2 MG tablet Commonly known as:  AMARYL Take 2-4 mg by mouth 2 (two) times  daily. Take 50ms in the morning and 231m at night   LORazepam 0.5 MG tablet Commonly known as:  ATIVAN Take 0.5 mg by mouth 2 (two) times daily.   losartan-hydrochlorothiazide 100-12.5 MG tablet Commonly known as:  HYZAAR Take 1 tablet by mouth daily.   metFORMIN 500 MG 24 hr tablet Commonly known as:  GLUCOPHAGE-XR Take 500 mg by mouth 2 (two) times daily.   moxifloxacin 0.5 % ophthalmic solution Commonly known as:  VIGAMOX PLACE 1 DROP IN RIGHT EYE FOUR TIMES A DAY USE THE DAY BEFORE, DAY OF, AND DAY AFTER TREATMENT.   ONE TOUCH ULTRA TEST test strip Generic drug:  glucose blood by Other route as needed.   pantoprazole 40 MG tablet Commonly known as:  PROTONIX TAKE 1 TABLET (40 MG TOTAL) BY MOUTH DAILY BEFORE BREAKFAST (MAX DAYS #30 ON INS)   PRESCRIPTION MEDICATION every 8 (eight) weeks. Gets injections in right eye at dr's office every 8 weeks   rosuvastatin 20 MG tablet Commonly known as:  CRESTOR Take 20 mg by mouth at bedtime.   testosterone cypionate 200 MG/ML injection Commonly known as:  DEPOTESTOSTERONE CYPIONATE Inject into the muscle every  21 ( twenty-one) days.   Viagra 100 MG tablet Generic drug:  sildenafil Take 100 mg by mouth daily as needed for erectile dysfunction.   Xarelto 10 MG Tabs tablet Generic drug:  rivaroxaban TAKE 1 TABLET BY MOUTH EVERY DAY   zolpidem 10 MG tablet Commonly known as:  AMBIEN Take 10 mg by mouth at bedtime as needed for sleep.       Allergies:  Allergies  Allergen Reactions  . Ibuprofen Other (See Comments)    Upper GI Bled  . Nsaids     Past Medical History, Surgical history, Social history, and Family History were reviewed and updated.  Review of Systems: Review of Systems  Constitutional: Negative.   HENT: Negative.   Eyes: Negative.   Respiratory: Negative.   Cardiovascular: Negative.   Gastrointestinal: Negative.   Genitourinary: Negative.   Musculoskeletal: Negative.   Skin: Negative.    Neurological: Negative.   Endo/Heme/Allergies: Negative.   Psychiatric/Behavioral: Negative.     Physical Exam:  weight is 213 lb 1.3 oz (96.7 kg). His oral temperature is 98 F (36.7 C). His blood pressure is 143/74 (abnormal) and his pulse is 83. His respiration is 17 and oxygen saturation is 97%.   Wt Readings from Last 3 Encounters:  05/14/19 213 lb 1.3 oz (96.7 kg)  03/03/19 212 lb (96.2 kg)  01/01/19 212 lb 6.4 oz (96.3 kg)    Physical Exam Vitals signs reviewed.  HENT:     Head: Normocephalic and atraumatic.  Eyes:     Pupils: Pupils are equal, round, and reactive to light.  Neck:     Musculoskeletal: Normal range of motion.  Cardiovascular:     Rate and Rhythm: Normal rate and regular rhythm.     Heart sounds: Normal heart sounds.  Pulmonary:     Effort: Pulmonary effort is normal.     Breath sounds: Normal breath sounds.  Abdominal:     General: Bowel sounds are normal.     Palpations: Abdomen is soft.  Musculoskeletal: Normal range of motion.        General: No tenderness or deformity.  Lymphadenopathy:     Cervical: No cervical adenopathy.  Skin:    General: Skin is warm and dry.     Findings: No erythema or rash.  Neurological:     Mental Status: He is alert and oriented to person, place, and time.  Psychiatric:        Behavior: Behavior normal.        Thought Content: Thought content normal.        Judgment: Judgment normal.      Lab Results  Component Value Date   WBC 8.1 05/14/2019   HGB 12.8 (L) 05/14/2019   HCT 41.5 05/14/2019   MCV 74.2 (L) 05/14/2019   PLT 318 05/14/2019   Lab Results  Component Value Date   FERRITIN 28 03/03/2019   IRON 27 (L) 03/03/2019   TIBC 418 (H) 03/03/2019   UIBC 391 (H) 03/03/2019   IRONPCTSAT 6 (L) 03/03/2019   Lab Results  Component Value Date   RETICCTPCT 1.5 05/14/2019   RBC 5.58 05/14/2019   RBC 5.59 05/14/2019   RETICCTABS 94.0 08/30/2011   No results found for: KPAFRELGTCHN, LAMBDASER,  KAPLAMBRATIO Lab Results  Component Value Date   IGGSERUM 828 10/11/2016   IGA 177 06/22/2016   No results found for: TOTALPROTELP, ALBUMINELP, A1GS, A2GS, BETS, BETA2SER, GAMS, MSPIKE, SPEI   Chemistry      Component Value Date/Time  NA 136 05/14/2019 1356   NA 137 10/29/2017 1307   NA 134 (L) 05/10/2017 0755   K 3.4 (L) 05/14/2019 1356   K 3.6 10/29/2017 1307   K 4.2 05/10/2017 0755   CL 103 05/14/2019 1356   CL 101 10/29/2017 1307   CO2 23 05/14/2019 1356   CO2 25 10/29/2017 1307   CO2 20 (L) 05/10/2017 0755   BUN 16 05/14/2019 1356   BUN 14 10/29/2017 1307   BUN 21.0 05/10/2017 0755   CREATININE 1.15 05/14/2019 1356   CREATININE 1.4 (H) 10/29/2017 1307   CREATININE 1.3 05/10/2017 0755      Component Value Date/Time   CALCIUM 10.0 05/14/2019 1356   CALCIUM 10.3 10/29/2017 1307   CALCIUM 10.3 05/10/2017 0755   ALKPHOS 89 05/14/2019 1356   ALKPHOS 77 10/29/2017 1307   ALKPHOS 97 05/10/2017 0755   AST 24 05/14/2019 1356   AST 25 05/10/2017 0755   ALT 23 05/14/2019 1356   ALT 27 10/29/2017 1307   ALT 26 05/10/2017 0755   BILITOT 0.5 05/14/2019 1356   BILITOT 0.70 05/10/2017 0755     Impression and Plan: Mr. Kloepfer is 73 year old gentleman with polycythemia. He is JAK2 negative.  He does not need to be phlebotomized today.  This is a true blessing.  We will see him back now in a couple months.  I think we have the luxury of going 2 or 3 months before we see him back.  [name], MD 5/27/20204:13 PM

## 2019-05-15 LAB — IRON AND TIBC
Iron: 25 ug/dL — ABNORMAL LOW (ref 42–163)
Saturation Ratios: 6 % — ABNORMAL LOW (ref 20–55)
TIBC: 416 ug/dL — ABNORMAL HIGH (ref 202–409)
UIBC: 391 ug/dL — ABNORMAL HIGH (ref 117–376)

## 2019-05-15 LAB — FERRITIN: Ferritin: 8 ng/mL — ABNORMAL LOW (ref 24–336)

## 2019-08-06 ENCOUNTER — Other Ambulatory Visit: Payer: Self-pay | Admitting: Hematology & Oncology

## 2019-08-13 ENCOUNTER — Encounter: Payer: Self-pay | Admitting: Hematology & Oncology

## 2019-08-13 ENCOUNTER — Inpatient Hospital Stay: Payer: 59

## 2019-08-13 ENCOUNTER — Telehealth: Payer: Self-pay | Admitting: Hematology & Oncology

## 2019-08-13 ENCOUNTER — Inpatient Hospital Stay: Payer: 59 | Attending: Hematology & Oncology | Admitting: Hematology & Oncology

## 2019-08-13 ENCOUNTER — Other Ambulatory Visit: Payer: Self-pay

## 2019-08-13 VITALS — BP 122/71 | HR 88 | Temp 98.0°F | Resp 20 | Wt 218.4 lb

## 2019-08-13 DIAGNOSIS — K7460 Unspecified cirrhosis of liver: Secondary | ICD-10-CM | POA: Diagnosis not present

## 2019-08-13 DIAGNOSIS — D500 Iron deficiency anemia secondary to blood loss (chronic): Secondary | ICD-10-CM | POA: Diagnosis not present

## 2019-08-13 DIAGNOSIS — D45 Polycythemia vera: Secondary | ICD-10-CM | POA: Insufficient documentation

## 2019-08-13 DIAGNOSIS — K7581 Nonalcoholic steatohepatitis (NASH): Secondary | ICD-10-CM | POA: Diagnosis not present

## 2019-08-13 LAB — CBC WITH DIFFERENTIAL (CANCER CENTER ONLY)
Abs Immature Granulocytes: 0.02 10*3/uL (ref 0.00–0.07)
Basophils Absolute: 0.1 10*3/uL (ref 0.0–0.1)
Basophils Relative: 2 %
Eosinophils Absolute: 0.1 10*3/uL (ref 0.0–0.5)
Eosinophils Relative: 1 %
HCT: 43.4 % (ref 39.0–52.0)
Hemoglobin: 12.5 g/dL — ABNORMAL LOW (ref 13.0–17.0)
Immature Granulocytes: 0 %
Lymphocytes Relative: 14 %
Lymphs Abs: 1.1 10*3/uL (ref 0.7–4.0)
MCH: 20.2 pg — ABNORMAL LOW (ref 26.0–34.0)
MCHC: 28.8 g/dL — ABNORMAL LOW (ref 30.0–36.0)
MCV: 70.1 fL — ABNORMAL LOW (ref 80.0–100.0)
Monocytes Absolute: 1.3 10*3/uL — ABNORMAL HIGH (ref 0.1–1.0)
Monocytes Relative: 17 %
Neutro Abs: 5.1 10*3/uL (ref 1.7–7.7)
Neutrophils Relative %: 66 %
Platelet Count: 412 10*3/uL — ABNORMAL HIGH (ref 150–400)
RBC: 6.19 MIL/uL — ABNORMAL HIGH (ref 4.22–5.81)
RDW: 20.1 % — ABNORMAL HIGH (ref 11.5–15.5)
WBC Count: 7.6 10*3/uL (ref 4.0–10.5)
nRBC: 0 % (ref 0.0–0.2)

## 2019-08-13 LAB — CMP (CANCER CENTER ONLY)
ALT: 20 U/L (ref 0–44)
AST: 21 U/L (ref 15–41)
Albumin: 4.5 g/dL (ref 3.5–5.0)
Alkaline Phosphatase: 72 U/L (ref 38–126)
Anion gap: 9 (ref 5–15)
BUN: 14 mg/dL (ref 8–23)
CO2: 26 mmol/L (ref 22–32)
Calcium: 9.9 mg/dL (ref 8.9–10.3)
Chloride: 102 mmol/L (ref 98–111)
Creatinine: 1.17 mg/dL (ref 0.61–1.24)
GFR, Est AFR Am: >60 mL/min (ref >60)
GFR, Estimated: >60 mL/min (ref >60)
Glucose, Bld: 136 mg/dL — ABNORMAL HIGH (ref 70–99)
Potassium: 3.5 mmol/L (ref 3.5–5.1)
Sodium: 137 mmol/L (ref 135–145)
Total Bilirubin: 0.6 mg/dL (ref 0.3–1.2)
Total Protein: 7 g/dL (ref 6.5–8.1)

## 2019-08-13 NOTE — Progress Notes (Signed)
Hematology and Oncology Follow Up Visit  James [name] [id number] [birth year] 73 y.o. 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 IV iron as indicated - last received in January 2020      Interim History:  [name] is here today for a follow-up.  He is doing okay.  He else is affected by the coronavirus.  He is now working from home.  Even worse is the fact that they his company wants him to retire.  He will retire at the end of the year.  He really does not want to retire but sounds like he has no choice.  Otherwise is been doing okay.  He is on the maintenance Xarelto and doing well with this.  He has had no bleeding.  He has had no change in bowel or bladder habits.  He has had no leg swelling.  He is not been able to really exercise.  He does like to swim but with the coronavirus, has not been able to.  His last iron studies that were done back in May showed a ferritin of 8 with an iron saturation of 6%.  Currently, his performance status is ECOG 1.  Medications:  Allergies as of 08/13/2019      Reactions   Ibuprofen Other (See Comments)   Upper GI Bled   Nsaids       Medication List       Accurate as of August 13, 2019  3:43 PM. If you have any questions, ask your nurse or doctor.        amLODipine 5 MG tablet Commonly known as: NORVASC Take 5 mg by mouth 2 (two) times daily.   benzonatate 100 MG capsule Commonly known as: TESSALON benzonatate 100 mg capsule  TAKE 1 CAP BY MOUTH UP TO 3 TIMES DAILY AS NEEDED FOR COUGH   buPROPion 150 MG 24 hr tablet Commonly known as: WELLBUTRIN XL Take 150 mg by mouth daily. Take 2 tablets total of 300 mg in the morning.   cephALEXin 500 MG capsule Commonly known as: KEFLEX cephalexin 500 mg capsule  TAKE 1 CAPSULE BY MOUTH EVERY 6 HOURS FOR 7 DAYS AS  DIRECTED   [employer] B-12 500 MCG Subl Generic drug: Cyanocobalamin Place 2 tablets under the tongue daily with breakfast.   desmopressin 0.2 MG tablet Commonly known as: DDAVP Take 0.4 mg by mouth 2 (two) times daily.   glimepiride 2 MG tablet Commonly known as: AMARYL Take 2-4 mg by mouth 2 (two) times daily. Take 68ms in the morning and 267m at night   hydrochlorothiazide 25 MG tablet Commonly known as: HYDRODIURIL Take 25 mg by mouth every morning.   LORazepam 0.5 MG tablet Commonly known as: ATIVAN Take 0.5 mg by mouth daily.   losartan-hydrochlorothiazide 100-12.5 MG tablet Commonly known as: HYZAAR Take 1 tablet by mouth daily.   metFORMIN 500 MG 24 hr tablet Commonly known as: GLUCOPHAGE-XR Take 500 mg by mouth 2 (two) times daily.   moxifloxacin 0.5 % ophthalmic solution Commonly known as: VIGAMOX PLACE 1 DROP IN RIGHT EYE FOUR TIMES A DAY USE THE DAY BEFORE, DAY OF, AND DAY AFTER TREATMENT.   glucose blood test strip OneTouch Ultra Blue Test Strip  USE TO TEST BLOOD SUGAR ONCE DAILY   ONE TOUCH ULTRA TEST test strip Generic drug: glucose blood by Other  route as needed.   pantoprazole 40 MG tablet Commonly known as: PROTONIX TAKE 1 TABLET (40 MG TOTAL) BY MOUTH DAILY BEFORE BREAKFAST (MAX DAYS #30 ON INS)   PRESCRIPTION MEDICATION every 8 (eight) weeks. Gets injections in right eye at dr's office every 8 weeks   rosuvastatin 20 MG tablet Commonly known as: CRESTOR Take 20 mg by mouth at bedtime.   testosterone cypionate 200 MG/ML injection Commonly known as: DEPOTESTOSTERONE CYPIONATE Inject 150 mg into the muscle every 21 ( twenty-one) days.   Viagra 100 MG tablet Generic drug: sildenafil Take 100 mg by mouth daily as needed for erectile dysfunction.   Xarelto 10 MG Tabs tablet Generic drug: rivaroxaban TAKE 1 TABLET BY MOUTH EVERY DAY   zolpidem 10 MG tablet Commonly known as: AMBIEN Take 10 mg by mouth at bedtime as needed for sleep.        Allergies:  Allergies  Allergen Reactions   Ibuprofen Other (See Comments)    Upper GI Bled   Nsaids     Past Medical History, Surgical history, Social history, and Family History were reviewed and updated.  Review of Systems: Review of Systems  Constitutional: Negative.   HENT: Negative.   Eyes: Negative.   Respiratory: Negative.   Cardiovascular: Negative.   Gastrointestinal: Negative.   Genitourinary: Negative.   Musculoskeletal: Negative.   Skin: Negative.   Neurological: Negative.   Endo/Heme/Allergies: Negative.   Psychiatric/Behavioral: Negative.     Physical Exam:  weight is 218 lb 6.4 oz (99.1 kg). His oral temperature is 98 F (36.7 C). His blood pressure is 122/71 and his pulse is 88. His respiration is 20 and oxygen saturation is 94%.   Wt Readings from Last 3 Encounters:  08/13/19 218 lb 6.4 oz (99.1 kg)  05/14/19 213 lb 1.3 oz (96.7 kg)  03/03/19 212 lb (96.2 kg)    Physical Exam Vitals signs reviewed.  HENT:     Head: Normocephalic and atraumatic.  Eyes:     Pupils: Pupils are equal, round, and reactive to light.  Neck:     Musculoskeletal: Normal range of motion.  Cardiovascular:     Rate and Rhythm: Normal rate and regular rhythm.     Heart sounds: Normal heart sounds.  Pulmonary:     Effort: Pulmonary effort is normal.     Breath sounds: Normal breath sounds.  Abdominal:     General: Bowel sounds are normal.     Palpations: Abdomen is soft.  Musculoskeletal: Normal range of motion.        General: No tenderness or deformity.  Lymphadenopathy:     Cervical: No cervical adenopathy.  Skin:    General: Skin is warm and dry.     Findings: No erythema or rash.  Neurological:     Mental Status: He is alert and oriented to person, place, and time.  Psychiatric:        Behavior: Behavior normal.        Thought Content: Thought content normal.        Judgment: Judgment normal.      Lab Results  Component Value Date   WBC 7.6  08/13/2019   HGB 12.5 (L) 08/13/2019   HCT 43.4 08/13/2019   MCV 70.1 (L) 08/13/2019   PLT 412 (H) 08/13/2019   Lab Results  Component Value Date   FERRITIN 8 (L) 05/14/2019   IRON 25 (L) 05/14/2019   TIBC 416 (H) 05/14/2019   UIBC 391 (H) 05/14/2019   IRONPCTSAT 6 (  L) 05/14/2019   Lab Results  Component Value Date   RETICCTPCT 1.5 05/14/2019   RBC 6.19 (H) 08/13/2019   RETICCTABS 94.0 08/30/2011   No results found for: Nils Pyle [facility] Lab Results  Component Value Date   IGGSERUM 828 10/11/2016   IGA 177 06/22/2016   No results found for: Odetta Pink, SPEI   Chemistry      Component Value Date/Time   NA 137 08/13/2019 1415   NA 137 10/29/2017 1307   NA 134 (L) 05/10/2017 0755   K 3.5 08/13/2019 1415   K 3.6 10/29/2017 1307   K 4.2 05/10/2017 0755   CL 102 08/13/2019 1415   CL 101 10/29/2017 1307   CO2 26 08/13/2019 1415   CO2 25 10/29/2017 1307   CO2 20 (L) 05/10/2017 0755   BUN 14 08/13/2019 1415   BUN 14 10/29/2017 1307   BUN 21.0 05/10/2017 0755   CREATININE 1.17 08/13/2019 1415   CREATININE 1.4 (H) 10/29/2017 1307   CREATININE 1.3 05/10/2017 0755      Component Value Date/Time   CALCIUM 9.9 08/13/2019 1415   CALCIUM 10.3 10/29/2017 1307   CALCIUM 10.3 05/10/2017 0755   ALKPHOS 72 08/13/2019 1415   ALKPHOS 77 10/29/2017 1307   ALKPHOS 97 05/10/2017 0755   AST 21 08/13/2019 1415   AST 25 05/10/2017 0755   ALT 20 08/13/2019 1415   ALT 27 10/29/2017 1307   ALT 26 05/10/2017 0755   BILITOT 0.6 08/13/2019 1415   BILITOT 0.70 05/10/2017 0755     Impression and Plan: Mr. Murakami is 73 year old gentleman with polycythemia. He is JAK2 negative.  He does not need to be phlebotomized today.  This is a true blessing.  We will see him back now in a couple months.  I think we have the luxury of going 2 or 3 months before we see him back.  [name], MD 8/26/20203:43 PM

## 2019-08-13 NOTE — Telephone Encounter (Signed)
lmom to inform patient of 11/18 appt at 245 pm per 8/26 LOS

## 2019-08-14 LAB — IRON AND TIBC
Iron: 19 ug/dL — ABNORMAL LOW (ref 42–163)
Saturation Ratios: 4 % — ABNORMAL LOW (ref 20–55)
TIBC: 453 ug/dL — ABNORMAL HIGH (ref 202–409)
UIBC: 433 ug/dL — ABNORMAL HIGH (ref 117–376)

## 2019-08-14 LAB — FERRITIN: Ferritin: 6 ng/mL — ABNORMAL LOW (ref 24–336)

## 2019-10-08 ENCOUNTER — Telehealth: Payer: Self-pay | Admitting: Gastroenterology

## 2019-10-08 NOTE — Telephone Encounter (Signed)
Pt requested to speak to a nurse about his current condition and to "follow up on procedures several years ago."

## 2019-10-08 NOTE — Telephone Encounter (Signed)
Yes clinic visit is fine, thanks

## 2019-10-08 NOTE — Telephone Encounter (Signed)
Called patient back, and James [name] states James [name] has been feeling tired lately and has not seen Dr. Havery Moros in about 1 yr. Would like an office visit. Please advise

## 2019-10-24 ENCOUNTER — Other Ambulatory Visit: Payer: Self-pay

## 2019-10-24 ENCOUNTER — Other Ambulatory Visit (INDEPENDENT_AMBULATORY_CARE_PROVIDER_SITE_OTHER): Payer: 59

## 2019-10-24 ENCOUNTER — Ambulatory Visit: Payer: 59 | Admitting: Gastroenterology

## 2019-10-24 ENCOUNTER — Encounter: Payer: Self-pay | Admitting: Gastroenterology

## 2019-10-24 VITALS — BP 132/78 | HR 91 | Temp 97.8°F | Ht 66.0 in | Wt 218.0 lb

## 2019-10-24 DIAGNOSIS — R5383 Other fatigue: Secondary | ICD-10-CM

## 2019-10-24 DIAGNOSIS — Z7901 Long term (current) use of anticoagulants: Secondary | ICD-10-CM | POA: Diagnosis not present

## 2019-10-24 DIAGNOSIS — D509 Iron deficiency anemia, unspecified: Secondary | ICD-10-CM | POA: Diagnosis not present

## 2019-10-24 DIAGNOSIS — Z86718 Personal history of other venous thrombosis and embolism: Secondary | ICD-10-CM | POA: Diagnosis not present

## 2019-10-24 DIAGNOSIS — K760 Fatty (change of) liver, not elsewhere classified: Secondary | ICD-10-CM

## 2019-10-24 LAB — CBC WITH DIFFERENTIAL/PLATELET
Basophils Absolute: 0.1 10*3/uL (ref 0.0–0.1)
Basophils Relative: 1.9 % (ref 0.0–3.0)
Eosinophils Absolute: 0.2 10*3/uL (ref 0.0–0.7)
Eosinophils Relative: 2.4 % (ref 0.0–5.0)
HCT: 41.5 % (ref 39.0–52.0)
Hemoglobin: 12.4 g/dL — ABNORMAL LOW (ref 13.0–17.0)
Lymphocytes Relative: 13.6 % (ref 12.0–46.0)
Lymphs Abs: 0.9 10*3/uL (ref 0.7–4.0)
MCHC: 29.8 g/dL — ABNORMAL LOW (ref 30.0–36.0)
MCV: 64.5 fl — ABNORMAL LOW (ref 78.0–100.0)
Monocytes Absolute: 1.1 10*3/uL — ABNORMAL HIGH (ref 0.1–1.0)
Monocytes Relative: 15.6 % — ABNORMAL HIGH (ref 3.0–12.0)
Neutro Abs: 4.6 10*3/uL (ref 1.4–7.7)
Neutrophils Relative %: 66.5 % (ref 43.0–77.0)
Platelets: 367 10*3/uL (ref 150.0–400.0)
RBC: 6.44 Mil/uL — ABNORMAL HIGH (ref 4.22–5.81)
RDW: 20.4 % — ABNORMAL HIGH (ref 11.5–15.5)
WBC: 6.9 10*3/uL (ref 4.0–10.5)

## 2019-10-24 NOTE — Progress Notes (Signed)
HPI :  73 year old male here for a follow-up visit.   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 He was started on anticoagulation with Xarelto.Incidentally noted at that time on imaging or changes concerning for underlying cirrhosis of the liver.Underwent ultrasound elastography showing F3/4 fibrosis and possible changes of cirrhosis. Along with this was anindeterminate liver lesion, for which the patient had a follow-up MRI of the liver which showed this appeared to be a benign cystic lesion. AFP was normal at the time. The patient also had some labs done to evaluate for chronic liver diseases which appeared negative.He was admitted for possible GI bleed, had an EGD done on 09/28/16 - multiple erosions / gastritis, benign fundic gland polyps, no varices noted. Clo test negative for H pylori. It was thought perhaps he had gastritis related to NSAID. He had negative labs for chronic liver diseases negative including labs for hemochromatosis. Transjugular liver biopsy with portal pressures obtained. He had a gradient of 6 which barely metcriteria for portal hypertension. Fortunately his liver biopsy did not show any evidence of cirrhosis, he had only some mild steatosis and is otherwise normal biopsy. Follow up MRI liver on 01/18/17 showed stable hepatic cyst, morphologic changes of mild cirrhosis.He had IV Iron infusion. Hgb normalized. Capsule endoscopy was performed and generally was a good prep with good views. 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  He has been followed with CTs over time in light of prior imaging abnormalities.   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  It is thought that his polycythemia has led to his prior thromboses. He has remained on Xarelto and following with Dr. [name].  He states in general he is feeling okay today.  His main symptom that is bothering him is decreased energy and feeling fatigued, this is been going on for the past 4 to 6 weeks.  He does have a history of sleep apnea and he reports sleeping well with his current CPAP, however he states his CPAP is dated and may need to be replaced.  He denies any new medications.  He is on testosterone every 3 weeks for supplementation.  He does have ongoing iron deficiency and managed by Dr. [name].  He has not repleted iron as the patient has a history of polycythemia has not had any significant anemia.  He denies any blood in his stools, he does have gas that bothers him at times.  He usually has a formed bowel movement most of the time.  His weight is stable for the most part.  He previously had significant depression anxiety over the past year, this is managed with bupropion and Ativan is doing much better in this regard.  He currently denies symptoms of depression  He most recently had a CT scan in September 2019 as outlined below He most recently had an EGD and colonoscopy as outlined below in 2019  Endoscopic history: Colonoscopy 12/10/15:6 small cecal polyps, moderate diverticulosis in the sigmoid colon and internal hemorrhoids. Pathology revealed tubular adenomas  CT abdomen / pelvis 08/29/18 - IMPRESSION: No acute intra-abdominal or intrapelvic abnormalities. Distal colonic diverticulosis without evidence of diverticulitis. Chronic splenic changes  likely reflecting sequela of infarcts. BILATERAL renal cysts. Small focus of chronic infiltration of the greater omentum question sequela of prior omental infarct.  EGD 12/09/18 - normal exam, no cause for anemia  Colonoscopy 12/09/18 - The perianal and digital  rectal examinations were normal. - The terminal ileum appeared normal. - A 2 to 3 mm polyp was found in the cecum. The polyp was sessile. The polyp was removed with a cold snare. Resection and retrieval were complete. - A 4 mm polyp was found in the ascending colon. The polyp was sessile. The polyp was removed with a cold snare. Resection and retrieval were complete. - Multiple small-mouthed diverticula were found in the sigmoid colon. - The exam was otherwise without abnormality.  Path shows adenomatous polyps   Past Medical History:  Diagnosis Date  . Anxiety   . Arthritis   . Clotting disorder ([place])   . Depression   . DM (diabetes mellitus) ([place])   . GERD (gastroesophageal reflux disease)   . Hiatal hernia   . History of shingles 04/03/2014  . HTN (hypertension)   . Hyperlipidemia   . Insomnia    resolved by using CPAP  . Iron deficiency anemia due to chronic blood loss 11/20/2016  . Macular degeneration   . Nocturia more than twice per night 11/11/2014   For 6 month, 5 nocturias a night.   . Obesity   . 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 Portal vein thrombosis   . Rhinitis   . Situational depression   . Sleep apnea    uses CPAP every night  . Tubular adenoma 12/10/2015   6 cecum polyps     Past Surgical History:  Procedure Laterality Date  . CARDIAC CATHETERIZATION     greater 10 yrs ago, normal  . CARPAL TUNNEL RELEASE     bilateral  . COLONOSCOPY  06/2005   [facility] Dr Lajoyce Corners  . ESOPHAGOGASTRODUODENOSCOPY (EGD) WITH PROPOFOL N/A 09/28/2016   Procedure: ESOPHAGOGASTRODUODENOSCOPY (EGD) WITH PROPOFOL;  Surgeon: Gatha Mayer, MD;  Location: [facility];  Service: Endoscopy;  Laterality: N/A;  . IR GENERIC HISTORICAL  12/12/2016   IR US GUIDE VASC ACCESS RIGHT 12/12/2016 [name], MD [facility]  . IR GENERIC HISTORICAL  12/12/2016   IR VENOGRAM HEPATIC W HEMODYNAMIC EVALUATION 12/12/2016 [name], MD [facility]  . IR GENERIC HISTORICAL   12/12/2016   IR TRANSCATHETER BX 12/12/2016 [name], MD [facility]  . ROTATOR CUFF REPAIR     right  . TENDON REPAIR     left arm  . TONSILLECTOMY    . TOTAL KNEE ARTHROPLASTY Right 2010  . WISDOM TOOTH EXTRACTION     Family History  Problem Relation Age of Onset  . Colon cancer Other   . Heart failure Other   . Heart attack Other   . Lung disease Father        history  . Diabetes Mother        history  . Stroke Mother        history  . Colon polyps Neg Hx   . Rectal cancer Neg Hx   . Stomach cancer Neg Hx   . Esophageal cancer Neg Hx    Social History   Tobacco Use  . Smoking status: Never Smoker  . Smokeless tobacco: Never Used  . Tobacco comment: never used tobacco  Substance Use Topics  . Alcohol use: Yes    Alcohol/week: 0.0 standard drinks  . Drug use:  No   Current Outpatient Medications  Medication Sig Dispense Refill  . amLODipine (NORVASC) 5 MG tablet Take 5 mg by mouth 2 (two) times daily.     . benzonatate (TESSALON) 100 MG capsule benzonatate 100 mg capsule  TAKE 1 CAP BY MOUTH UP TO 3 TIMES DAILY AS NEEDED FOR COUGH    . buPROPion (WELLBUTRIN XL) 150 MG 24 hr tablet Take 150 mg by mouth daily. Take 2 tablets total of 300 mg in the morning.  5  . [facility] B-12 500 MCG SUBL Place 2 tablets under the tongue daily with breakfast.   12  . desmopressin (DDAVP) 0.2 MG tablet Take 0.4 mg by mouth 2 (two) times daily.     [name] glimepiride (AMARYL) 2 MG tablet Take 2-4 mg by mouth 2 (two) times daily. Take 33ms in the morning and 229m at night    . glucose blood test strip OneTouch Ultra Blue Test Strip  USE TO TEST BLOOD SUGAR ONCE DAILY    . hydrochlorothiazide (HYDRODIURIL) 25 MG tablet Take 25 mg by mouth every morning.    . Marland KitchenORazepam (ATIVAN) 0.5 MG tablet Take 0.5 mg by mouth daily.   3  . metFORMIN (GLUCOPHAGE-XR) 500 MG 24 hr tablet Take 500 mg by mouth 2 (two) times daily.     . Marland Kitchenoxifloxacin (VIGAMOX) 0.5 % ophthalmic solution PLACE 1 DROP IN RIGHT EYE  FOUR TIMES A DAY USE THE DAY BEFORE, DAY OF, AND DAY AFTER TREATMENT.  5  . ONE TOUCH ULTRA TEST test strip by Other route as needed.     . pantoprazole (PROTONIX) 40 MG tablet TAKE 1 TABLET (40 MG TOTAL) BY MOUTH DAILY BEFORE BREAKFAST (MAX DAYS #30 ON INS) 30 tablet 5  . PRESCRIPTION MEDICATION every 8 (eight) weeks. Gets injections in right eye at dr's office every 8 weeks    . rosuvastatin (CRESTOR) 20 MG tablet Take 20 mg by mouth at bedtime.     . Marland Kitchenestosterone cypionate (DEPOTESTOSTERONE CYPIONATE) 200 MG/ML injection Inject 150 mg into the muscle every 21 ( twenty-one) days.     . Marland KitchenIAGRA 100 MG tablet Take 100 mg by mouth daily as needed for erectile dysfunction.   1  . XARELTO 10 MG TABS tablet TAKE 1 TABLET BY MOUTH EVERY DAY 30 tablet 11  . zolpidem (AMBIEN) 10 MG tablet Take 10 mg by mouth at bedtime as needed for sleep.    . cephALEXin (KEFLEX) 500 MG capsule cephalexin 500 mg capsule  TAKE 1 CAPSULE BY MOUTH EVERY 6 HOURS FOR 7 DAYS AS DIRECTED     No current facility-administered medications for this visit.    Allergies  Allergen Reactions  . Ibuprofen Other (See Comments)    Upper GI Bled  . Nsaids      Review of Systems: All systems reviewed and negative except where noted in HPI.    No results found.  Physical Exam: BP 132/78 (BP Location: Left Arm, Patient Position: Sitting, Cuff Size: Normal)   Pulse 91   Temp 97.8 F (36.6 C) (Oral)   Ht 5' 6" (1.676 m)   Wt 218 lb (98.9 kg)   BMI 35.19 kg/m  Constitutional: Pleasant,well-developed, male in no acute distress. HEENT: Normocephalic and atraumatic. Conjunctivae are normal. No scleral icterus. Neck supple.  Cardiovascular: Normal rate, regular rhythm.  Pulmonary/chest: Effort normal and breath sounds normal. No wheezing, rales or rhonchi. Abdominal: Soft, protuberant, nontender.  There are no masses palpable. No hepatomegaly. Extremities: no edema Lymphadenopathy: No  cervical adenopathy noted.  Neurological: Alert and oriented to person place and time. Skin: Skin is warm and dry. No rashes noted. Psychiatric: Normal mood and affect. Behavior is normal.   ASSESSMENT AND PLAN: 73 year old male here for reassessment of the following issues:  Iron deficiency anemia / anticoagulated -ongoing iron deficiency anemia that has not been repleted given his history of polycythemia with history of thromboses.  He has had a slight downtrend in his hemoglobin since I have last seen him.  Unclear if this is related in any way to his fatigue or not as it seems mild.  I will recheck his CBC today to ensure stable hemoglobin.  Otherwise it is suspected he's had some oozing from small bowel AVMs noted on prior capsule.  He has not had any other clear cause for his anemia on EGD and colonoscopy x2.  He will continue to follow with Dr. [name] who will determine if he warrants iron supplementation at some point time.  Fatigue - nonspecific and unclear what is driving this as above.  We will repeat a CBC to recheck his hemoglobin today.  He is on testosterone therapy already.  His anxiety and depression seems well managed at this time. Not sure if this is anyway related to his sleep apnea as his CPAP device is old and needs to be replaced, he will work on this.  We will let him know results of labs  Portal / splenic vein / SMV thrombosis / omental infarcts- thought to be related to polycythemia in the past, continue with anticoagulation, no need for surveillance CT at this time.   Fatty liver - no evidence of cirrhosis based on prior liver biopsy, LFTs normal, will monitor  [place] Cellar, MD [facility]

## 2019-10-24 NOTE — Patient Instructions (Addendum)
If you are age 74 or older, your body mass index should be between 23-30. Your Body mass index is 35.19 kg/m. If this is out of the aforementioned range listed, please consider follow up with your Primary Care Provider.  If you are age 44 or younger, your body mass index should be between 19-25. Your Body mass index is 35.19 kg/m. If this is out of the aformentioned range listed, please consider follow up with your Primary Care Provider.   To help prevent the possible spread of infection to our patients, communities, and staff; we will be implementing the following measures:  As of now we are not allowing any visitors/family members to accompany you to any upcoming appointments with [facility]. If you have any concerns about this please contact our office to discuss prior to the appointment.   Please go to the lab in the basement of our building to have lab work done as you leave today. Hit "B" for basement when you get on the elevator.  When the doors open the lab is on your left.  We will call you with the results. Thank you.     Thank you for entrusting me with your care and for choosing [facility], Dr. Tracyton Cellar

## 2019-10-27 ENCOUNTER — Telehealth: Payer: Self-pay | Admitting: Neurology

## 2019-10-27 NOTE — Telephone Encounter (Addendum)
Called the patient back and made him aware that insurance will cover a new machine about every 5 yrs. The machine he currently has was set up 12/16/2014. Advised the patient that he will be eligible for a new machine around 12/17/2019. Informed him when he comes in for his visit with Amy in December just advise her and she can write for a new machine and we can send that off to his current DME company. Pt verbalized understanding. Pt will need to make a follow up visit apt scheduled around end of feb,first of march 2021 for a 31-90 day follow up for the initial CPAP (for starting a new machine)

## 2019-10-27 NOTE — Telephone Encounter (Signed)
Pt called to inform that his GI made the suggestion that he look into requesting a new CPAP because of the age of it.  Please call pt to discuss

## 2019-11-05 ENCOUNTER — Other Ambulatory Visit: Payer: Self-pay

## 2019-11-05 ENCOUNTER — Encounter: Payer: Self-pay | Admitting: Hematology & Oncology

## 2019-11-05 ENCOUNTER — Inpatient Hospital Stay: Payer: 59

## 2019-11-05 ENCOUNTER — Inpatient Hospital Stay: Payer: 59 | Attending: Hematology & Oncology | Admitting: Hematology & Oncology

## 2019-11-05 VITALS — BP 124/97 | HR 92 | Temp 96.0°F | Resp 20 | Wt 218.4 lb

## 2019-11-05 DIAGNOSIS — D45 Polycythemia vera: Secondary | ICD-10-CM

## 2019-11-05 DIAGNOSIS — K7581 Nonalcoholic steatohepatitis (NASH): Secondary | ICD-10-CM

## 2019-11-05 DIAGNOSIS — D500 Iron deficiency anemia secondary to blood loss (chronic): Secondary | ICD-10-CM

## 2019-11-05 DIAGNOSIS — K7460 Unspecified cirrhosis of liver: Secondary | ICD-10-CM

## 2019-11-05 LAB — CBC WITH DIFFERENTIAL (CANCER CENTER ONLY)
Abs Immature Granulocytes: 0.03 10*3/uL (ref 0.00–0.07)
Basophils Absolute: 0.2 10*3/uL — ABNORMAL HIGH (ref 0.0–0.1)
Basophils Relative: 2 %
Eosinophils Absolute: 0.1 10*3/uL (ref 0.0–0.5)
Eosinophils Relative: 1 %
HCT: 44.2 % (ref 39.0–52.0)
Hemoglobin: 12.4 g/dL — ABNORMAL LOW (ref 13.0–17.0)
Immature Granulocytes: 0 %
Lymphocytes Relative: 13 %
Lymphs Abs: 1.2 10*3/uL (ref 0.7–4.0)
MCH: 18.8 pg — ABNORMAL LOW (ref 26.0–34.0)
MCHC: 28.1 g/dL — ABNORMAL LOW (ref 30.0–36.0)
MCV: 67 fL — ABNORMAL LOW (ref 80.0–100.0)
Monocytes Absolute: 1.4 10*3/uL — ABNORMAL HIGH (ref 0.1–1.0)
Monocytes Relative: 15 %
Neutro Abs: 6.4 10*3/uL (ref 1.7–7.7)
Neutrophils Relative %: 69 %
Platelet Count: 410 10*3/uL — ABNORMAL HIGH (ref 150–400)
RBC: 6.6 MIL/uL — ABNORMAL HIGH (ref 4.22–5.81)
RDW: 21.8 % — ABNORMAL HIGH (ref 11.5–15.5)
WBC Count: 9.4 10*3/uL (ref 4.0–10.5)
nRBC: 0 % (ref 0.0–0.2)

## 2019-11-05 LAB — CMP (CANCER CENTER ONLY)
ALT: 18 U/L (ref 0–44)
AST: 20 U/L (ref 15–41)
Albumin: 4.6 g/dL (ref 3.5–5.0)
Alkaline Phosphatase: 86 U/L (ref 38–126)
Anion gap: 9 (ref 5–15)
BUN: 18 mg/dL (ref 8–23)
CO2: 26 mmol/L (ref 22–32)
Calcium: 10.5 mg/dL — ABNORMAL HIGH (ref 8.9–10.3)
Chloride: 101 mmol/L (ref 98–111)
Creatinine: 1.43 mg/dL — ABNORMAL HIGH (ref 0.61–1.24)
GFR, Est AFR Am: 56 mL/min — ABNORMAL LOW (ref >60)
GFR, Estimated: 48 mL/min — ABNORMAL LOW (ref >60)
Glucose, Bld: 198 mg/dL — ABNORMAL HIGH (ref 70–99)
Potassium: 4 mmol/L (ref 3.5–5.1)
Sodium: 136 mmol/L (ref 135–145)
Total Bilirubin: 0.6 mg/dL (ref 0.3–1.2)
Total Protein: 6.5 g/dL (ref 6.5–8.1)

## 2019-11-05 NOTE — Progress Notes (Signed)
Hematology and Oncology Follow Up Visit  James [name] [id number] 1946/02/17 73 y.o. 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 IV iron as indicated - last received in January 2020      Interim History:  [name] is here today for a follow-up.  He is doing okay.  He will retire at the end of this year.  He is not too happy about having to retire but it sounds like his company is making him.  There is no issues so far with him.  He has had no abdominal pain.  He has had no bleeding.  He is on Xarelto.  There is been no problems with leg swelling.  His blood pressure has been a little on the high side.  His blood sugar clearly is on the high side.  I suspect that his family doctor will be able to manage this.  We last saw him back in August, his ferritin was 6 and iron saturation of 4%.  We have had to give him a little bit of iron every now and then but his blood clearly is not low enough that would cause a problem.  There is been no fever.  He has had no headache.  His would not been able to exercise all that much.  Overall, his performance status is ECOG 0.   Medications:  Allergies as of 11/05/2019      Reactions   Ibuprofen Other (See Comments)   Upper GI Bled   Nsaids       Medication List       Accurate as of November 05, 2019  3:03 PM. If you have any questions, ask your nurse or doctor.        amLODipine 5 MG tablet Commonly known as: NORVASC Take 5 mg by mouth 2 (two) times daily.   benzonatate 100 MG capsule Commonly known as: TESSALON benzonatate 100 mg capsule  TAKE 1 CAP BY MOUTH UP TO 3 TIMES DAILY AS NEEDED FOR COUGH   buPROPion 150 MG 24 hr tablet Commonly known as: WELLBUTRIN XL Take 150 mg by mouth daily. Take 2 tablets total of 300 mg in the morning.   cephALEXin 500 MG  capsule Commonly known as: KEFLEX cephalexin 500 mg capsule  TAKE 1 CAPSULE BY MOUTH EVERY 6 HOURS FOR 7 DAYS AS DIRECTED   CVS B-12 500 MCG Subl Generic drug: Cyanocobalamin Place 2 tablets under the tongue daily with breakfast.   desmopressin 0.2 MG tablet Commonly known as: DDAVP Take 0.4 mg by mouth 2 (two) times daily.   fluticasone 50 MCG/ACT nasal spray Commonly known as: FLONASE Place 1 spray into both nostrils 2 (two) times daily.   glimepiride 2 MG tablet Commonly known as: AMARYL Take 2-4 mg by mouth 2 (two) times daily. Take 88ms in the morning and 264m at night   hydrochlorothiazide 25 MG tablet Commonly known as: HYDRODIURIL Take 25 mg by mouth every morning.   LORazepam 0.5 MG tablet Commonly known as: ATIVAN Take 0.5 mg by mouth daily.   metFORMIN 500 MG 24 hr tablet Commonly known as: GLUCOPHAGE-XR Take 500 mg by mouth 2 (two) times daily.   moxifloxacin 0.5 % ophthalmic solution Commonly known as: VIGAMOX PLACE 1 DROP IN RIGHT EYE FOUR TIMES A DAY USE THE DAY BEFORE, DAY OF, AND DAY AFTER TREATMENT.  glucose blood test strip OneTouch Ultra Blue Test Strip  USE TO TEST BLOOD SUGAR ONCE DAILY   ONE TOUCH ULTRA TEST test strip Generic drug: glucose blood by Other route as needed.   pantoprazole 40 MG tablet Commonly known as: PROTONIX TAKE 1 TABLET (40 MG TOTAL) BY MOUTH DAILY BEFORE BREAKFAST (MAX DAYS #30 ON INS)   PRESCRIPTION MEDICATION every 8 (eight) weeks. Gets injections in right eye at dr's office every 8 weeks   rosuvastatin 20 MG tablet Commonly known as: CRESTOR Take 20 mg by mouth at bedtime.   testosterone cypionate 200 MG/ML injection Commonly known as: DEPOTESTOSTERONE CYPIONATE Inject 150 mg into the muscle every 21 ( twenty-one) days.   Viagra 100 MG tablet Generic drug: sildenafil Take 100 mg by mouth daily as needed for erectile dysfunction.   Xarelto 10 MG Tabs tablet Generic drug: rivaroxaban TAKE 1 TABLET BY  MOUTH EVERY DAY   zolpidem 10 MG tablet Commonly known as: AMBIEN Take 10 mg by mouth at bedtime as needed for sleep.       Allergies:  Allergies  Allergen Reactions  . Ibuprofen Other (See Comments)    Upper GI Bled  . Nsaids     Past Medical History, Surgical history, Social history, and Family History were reviewed and updated.  Review of Systems: Review of Systems  Constitutional: Negative.   HENT: Negative.   Eyes: Negative.   Respiratory: Negative.   Cardiovascular: Negative.   Gastrointestinal: Negative.   Genitourinary: Negative.   Musculoskeletal: Negative.   Skin: Negative.   Neurological: Negative.   Endo/Heme/Allergies: Negative.   Psychiatric/Behavioral: Negative.     Physical Exam:  weight is 218 lb 6.4 oz (99.1 kg). His oral temperature is 96 F (35.6 C) (abnormal). His blood pressure is 124/97 (abnormal) and his pulse is 92. His respiration is 20 and oxygen saturation is 95%.   Wt Readings from Last 3 Encounters:  11/05/19 218 lb 6.4 oz (99.1 kg)  10/24/19 218 lb (98.9 kg)  08/13/19 218 lb 6.4 oz (99.1 kg)    Physical Exam Vitals signs reviewed.  HENT:     Head: Normocephalic and atraumatic.  Eyes:     Pupils: Pupils are equal, round, and reactive to light.  Neck:     Musculoskeletal: Normal range of motion.  Cardiovascular:     Rate and Rhythm: Normal rate and regular rhythm.     Heart sounds: Normal heart sounds.  Pulmonary:     Effort: Pulmonary effort is normal.     Breath sounds: Normal breath sounds.  Abdominal:     General: Bowel sounds are normal.     Palpations: Abdomen is soft.  Musculoskeletal: Normal range of motion.        General: No tenderness or deformity.  Lymphadenopathy:     Cervical: No cervical adenopathy.  Skin:    General: Skin is warm and dry.     Findings: No erythema or rash.  Neurological:     Mental Status: He is alert and oriented to person, place, and time.  Psychiatric:        Behavior: Behavior  normal.        Thought Content: Thought content normal.        Judgment: Judgment normal.      Lab Results  Component Value Date   WBC 9.4 11/05/2019   HGB 12.4 (L) 11/05/2019   HCT 44.2 11/05/2019   MCV 67.0 (L) 11/05/2019   PLT 410 (H) 11/05/2019   Lab  Results  Component Value Date   FERRITIN 6 (L) 08/13/2019   IRON 19 (L) 08/13/2019   TIBC 453 (H) 08/13/2019   UIBC 433 (H) 08/13/2019   IRONPCTSAT 4 (L) 08/13/2019   Lab Results  Component Value Date   RETICCTPCT 1.5 05/14/2019   RBC 6.60 (H) 11/05/2019   RETICCTABS 94.0 08/30/2011   No results found for: Nils Pyle [facility] Lab Results  Component Value Date   IGGSERUM 828 10/11/2016   IGA 177 06/22/2016   No results found for: Odetta Pink, SPEI   Chemistry      Component Value Date/Time   NA 137 08/13/2019 1415   NA 137 10/29/2017 1307   NA 134 (L) 05/10/2017 0755   K 3.5 08/13/2019 1415   K 3.6 10/29/2017 1307   K 4.2 05/10/2017 0755   CL 102 08/13/2019 1415   CL 101 10/29/2017 1307   CO2 26 08/13/2019 1415   CO2 25 10/29/2017 1307   CO2 20 (L) 05/10/2017 0755   BUN 14 08/13/2019 1415   BUN 14 10/29/2017 1307   BUN 21.0 05/10/2017 0755   CREATININE 1.17 08/13/2019 1415   CREATININE 1.4 (H) 10/29/2017 1307   CREATININE 1.3 05/10/2017 0755      Component Value Date/Time   CALCIUM 9.9 08/13/2019 1415   CALCIUM 10.3 10/29/2017 1307   CALCIUM 10.3 05/10/2017 0755   ALKPHOS 72 08/13/2019 1415   ALKPHOS 77 10/29/2017 1307   ALKPHOS 97 05/10/2017 0755   AST 21 08/13/2019 1415   AST 25 05/10/2017 0755   ALT 20 08/13/2019 1415   ALT 27 10/29/2017 1307   ALT 26 05/10/2017 0755   BILITOT 0.6 08/13/2019 1415   BILITOT 0.70 05/10/2017 0755     Impression and Plan: Mr. Douthit is 73 year old gentleman with polycythemia. He is JAK2 negative.  He does not need to be phlebotomized today.  He does not need any iron.  This is a true  blessing.  We will see him back in 3 months.    [name], MD 11/18/20203:03 PM

## 2019-11-06 LAB — IRON AND TIBC
Iron: 20 ug/dL — ABNORMAL LOW (ref 42–163)
Saturation Ratios: 4 % — ABNORMAL LOW (ref 20–55)
TIBC: 444 ug/dL — ABNORMAL HIGH (ref 202–409)
UIBC: 424 ug/dL — ABNORMAL HIGH (ref 117–376)

## 2019-11-06 LAB — FERRITIN: Ferritin: 8 ng/mL — ABNORMAL LOW (ref 24–336)

## 2019-11-07 ENCOUNTER — Ambulatory Visit: Payer: 59 | Admitting: Gastroenterology

## 2019-11-28 ENCOUNTER — Other Ambulatory Visit: Payer: Self-pay

## 2019-11-28 DIAGNOSIS — Z20822 Contact with and (suspected) exposure to covid-19: Secondary | ICD-10-CM

## 2019-11-29 LAB — NOVEL CORONAVIRUS, NAA: SARS-CoV-2, NAA: NOT DETECTED

## 2019-12-09 ENCOUNTER — Ambulatory Visit (INDEPENDENT_AMBULATORY_CARE_PROVIDER_SITE_OTHER): Payer: 59 | Admitting: Family Medicine

## 2019-12-09 ENCOUNTER — Encounter: Payer: Self-pay | Admitting: Family Medicine

## 2019-12-09 ENCOUNTER — Other Ambulatory Visit: Payer: Self-pay

## 2019-12-09 VITALS — BP 131/82 | HR 83 | Temp 97.8°F | Ht 66.0 in | Wt 213.2 lb

## 2019-12-09 DIAGNOSIS — Z9989 Dependence on other enabling machines and devices: Secondary | ICD-10-CM | POA: Diagnosis not present

## 2019-12-09 DIAGNOSIS — G4733 Obstructive sleep apnea (adult) (pediatric): Secondary | ICD-10-CM | POA: Diagnosis not present

## 2019-12-09 NOTE — Patient Instructions (Signed)
Continue CPAP nightly. We will order new CPAP machine.   Follow up 60 days following starting new CPAP  Sleep Apnea Sleep apnea affects breathing during sleep. It causes breathing to stop for a short time or to become shallow. It can also increase the risk of:  Heart attack.  Stroke.  Being very overweight (obese).  Diabetes.  Heart failure.  Irregular heartbeat. The goal of treatment is to help you breathe normally again. What are the causes? There are three kinds of sleep apnea:  Obstructive sleep apnea. This is caused by a blocked or collapsed airway.  Central sleep apnea. This happens when the brain does not send the right signals to the muscles that control breathing.  Mixed sleep apnea. This is a combination of obstructive and central sleep apnea. The most common cause of this condition is a collapsed or blocked airway. This can happen if:  Your throat muscles are too relaxed.  Your tongue and tonsils are too large.  You are overweight.  Your airway is too small. What increases the risk?  Being overweight.  Smoking.  Having a small airway.  Being older.  Being male.  Drinking alcohol.  Taking medicines to calm yourself (sedatives or tranquilizers).  Having family members with the condition. What are the signs or symptoms?  Trouble staying asleep.  Being sleepy or tired during the day.  Getting angry a lot.  Loud snoring.  Headaches in the morning.  Not being able to focus your mind (concentrate).  Forgetting things.  Less interest in sex.  Mood swings.  Personality changes.  Feelings of sadness (depression).  Waking up a lot during the night to pee (urinate).  Dry mouth.  Sore throat. How is this diagnosed?  Your medical history.  A physical exam.  A test that is done when you are sleeping (sleep study). The test is most often done in a sleep lab but may also be done at home. How is this treated?   Sleeping on your  side.  Using a medicine to get rid of mucus in your nose (decongestant).  Avoiding the use of alcohol, medicines to help you relax, or certain pain medicines (narcotics).  Losing weight, if needed.  Changing your diet.  Not smoking.  Using a machine to open your airway while you sleep, such as: ? An oral appliance. This is a mouthpiece that shifts your lower jaw forward. ? A CPAP device. This device blows air through a mask when you breathe out (exhale). ? An EPAP device. This has valves that you put in each nostril. ? A BPAP device. This device blows air through a mask when you breathe in (inhale) and breathe out.  Having surgery if other treatments do not work. It is important to get treatment for sleep apnea. Without treatment, it can lead to:  High blood pressure.  Coronary artery disease.  In men, not being able to have an erection (impotence).  Reduced thinking ability. Follow these instructions at home: Lifestyle  Make changes that your doctor recommends.  Eat a healthy diet.  Lose weight if needed.  Avoid alcohol, medicines to help you relax, and some pain medicines.  Do not use any products that contain nicotine or tobacco, such as cigarettes, e-cigarettes, and chewing tobacco. If you need help quitting, ask your doctor. General instructions  Take over-the-counter and prescription medicines only as told by your doctor.  If you were given a machine to use while you sleep, use it only as told  by your doctor.  If you are having surgery, make sure to tell your doctor you have sleep apnea. You may need to bring your device with you.  Keep all follow-up visits as told by your doctor. This is important. Contact a doctor if:  The machine that you were given to use during sleep bothers you or does not seem to be working.  You do not get better.  You get worse. Get help right away if:  Your chest hurts.  You have trouble breathing in enough air.  You have  an uncomfortable feeling in your back, arms, or stomach.  You have trouble talking.  One side of your body feels weak.  A part of your face is hanging down. These symptoms may be an emergency. Do not wait to see if the symptoms will go away. Get medical help right away. Call your local emergency services (911 in the [place] Do not drive yourself to the hospital. Summary  This condition affects breathing during sleep.  The most common cause is a collapsed or blocked airway.  The goal of treatment is to help you breathe normally while you sleep. This information is not intended to replace advice given to you by your health care provider. Make sure you discuss any questions you have with your health care provider. Document Released: 09/12/2008 Document Revised: 09/20/2018 Document Reviewed: 07/30/2018 [employer] Patient Education  2020 [employer].

## 2019-12-09 NOTE — Progress Notes (Signed)
PATIENT: James [name]. DOB: [birth year]  REASON FOR VISIT: follow up HISTORY FROM: patient  Chief Complaint  Patient presents with  . Follow-up    RM2,alone. States he would like to get a updated CPAP machine. Stipulates he uses it every night.      HISTORY OF PRESENT ILLNESS: Today 12/09/19 James [name]. is a 73 y.o. male here today for follow up for OSA on CPAP. He is doing well. He is using CPAP nightly. Sometimes he drifts off to sleep prior to placing therapy. He was recently tested for COVID-19 on 11/28/2019.  Results were negative.  He would like to get a new CPAP machine. He reports that power cord will not stay connected. He is also noting error messages of leaks but he does not feel this is accurate. Last RX was 12/16/2014.   Compliance report dated 11/08/2019 through 12/07/2019 reveals that he used CPAP 29 of the last 30 days for compliance of 97%.  20 of the last 30 days he used CPAP greater than 4 hours for compliance of 67%.  Average usage was 4 hours and 55 minutes.  Residual AHI was 1.5 on 5 to 12 cm of water and an EPR of 3.  There was no significant leak noted.  HISTORY: (copied from Dr [name]'s note on 12/12/2018)  HISTORY 10/29/15: James Brandtis a 73 y.o.male,seen here as a referral from Dr. Dorette Grate upon recommendation of his Urologist for a sleep evaluation.  He was recently told by Dr. Sharlett Iles as well as by his urologist that his nocturia is not related to any prostate pathology that he had no trouble emptying his full bladder. Since he is also obese to Dr. Philip Aspen suggested to have a sleep apnea evaluation as a possible cause for the nocturia. The patient reports that he on some nights have restless legs. He often feels not restored or refreshed in the morning feeling like he didn't get enough sleep at night. He has some muscle cramps as well coughing and shortness of breath has been reported daytime fatigue and hearing loss. He has a history  of hypertension, diabetes, obesity, and high cholesterol.  It is review of systems he also endorsed fatigue at 30 points the Epworth Sleepiness Scale at 13 points which indicates hypersomnia and the geriatric depression score at one point.   Dr. Philip Aspen also listed that the patient has hypogonadism, and is on testosterone replacements, he was diagnosed with vitamin B12 deficiency pernicious anemia but the level is still considered borderline and he was given or prescribed sublingual vitamin B12 tablets, he has allergic rhinitis which may interfere with CPAP use as needed. His diabetes is controlled on metformin and glimepiride.  He is also on Diovan which contains a diuretic and takes it in the morning. He has peripheral vascular disease but no claudications and suffered in the past from shingles and has postherpetic neuralgia he has polycythemia vera . Problem #1 on his list was insomnia which really seems to be related to nocturia as cause for inability to maintain sleep. He goes to bed at 8 PM and is asleep by 8.05, he jokes. He falls asleep in church , in cinema and during TV or reading. He wakes at 4.30 and has been meanwhile up 4-5 times. His first nocturia is after 4 hours and then every 60- 90 minutes.  He doesn't nap at work. 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  James [name]  reports that he has a son that also has sleep apnea and is actually depending happily on the use of his CPAP machine. He lost one son to suicide. His mother and aunts had sleepiness.   01-29-15 James [name] underwent a split night polysomnography on 11-26-14 after he had endorsed the Epworth sleepiness score at an elevated level of 13 points. He has a past medical history including polycythemia vera obesity dyspnea and hypertension and diabetes. In addition he had nocturia more than 3 times at night. He was diagnosed with an AHI of 24.8 and an RDI of 26.5 he slept not on his back  but only on his side during the night in the sleep lab he also had very frequent periodic limb movements his PLM arousals were 18.5 per hour. He was titrated to 7 cm CPAP with a reduction of the AHI to 5.4 the now residual apneas were mostly central in nature. His PLM still continued as he was on CPAP. I see him today for his first visit after the sleep study he endorsed today the Epworth sleepiness score at 6 points which is quite reduced in comparison and his fatigue severity at 19 points. He has been using an O2 sat CPAP machine with a minimum pressure of 5 cm water and a maximum pressure of 12 cm water the 95 st percentile of pressure is 11.2 He is 100% compliance for 30 days and 97% compliance for over 4 hours of use, average time of use of 6 hours 59 minutes EPR level 3 cm water.I would like for this patient to remain on this AutoSet machine- his residual AHI is only 1.2 , there are no central apneas. He does have a mild air leak. He uses a nasal covering mask. He has some pressure marks around the nose and I suggested to try and nasal pillow. He had not seen one before also the sleep lab staff is supposed to offer several options to the patient. He does like the res med airfit P 10. I would like to add that the patient still has 3 nocturia is at night in spite of having sufficiently even wonderfully treated.  Interval history from 09-28-15, James [name] is here today for follow-up visit with an excellent compliance report. He has used the machine 100% of the last 30 days over 4 hours with an average user time of 7 hours and 54 minutes. He remains on AUTO set 5-12 cm water with 3 cm EPR. He has minor air leaks his 95th percentile pressure is 11.7. The AHI is 0.9. The patient reports sleeping through the night and having only 2 bathroom breaks down from 3 or 4 before being treated for OSA. He tolerates the CPAP well and his Epworth sleepiness score has decreased to 8 points his fatigue severity 27  points and he does not indicate a significant depression on his geriatric depression scale.   09/27/2016: James [name] is a 73 year old male with a history of obstructive sleep apnea on CPAP. He returns today for a compliance download. His download indicates that he uses machine 30 out of 30 days for compliance of 100%. Each night he uses machine greater than 4 hours. On average he uses his machine 7 hours and 30 minutes. His residual AHI is 0.6 on a minimum pressure of 5 cm water and maximum pressure of 12 cm water with EPR 3. His leak in the 95th percentile is 16.9 L/m. Overall he feels that he is doing well. He  continues to use the fullface mask. His Epworth sleepiness score is 5. He returns today for an evaluation.  12-15-2018, RV--pleasure of meeting with James [name], James [name]. today and meanwhile 74 year old Caucasian gentleman with severe hearing loss and obstructive sleep apnea treated on CPAP.  He is here today for his routine compliance visit.[name]  He is also seen by Dr. Casimiro Needle for treatment of anxiety and depression, he receives Wellbutrin and lorazepam as well as Ambien prescriptions through them. He has been on CPAP for over a decade. About 8 years ago his son died and he was without sleep for many days, treated there ever since.  The patient is as in the years before highly compliant, 30 out of 30 days use was 29 of those days over 4 consecutive hours average user time is 6 hours and 49 minutes, minimum pressure 5 maximum pressure on his AutoSet 12 cmH2O with 3 cm EPR and an AHI of 0.3.  No major air leaks the 95th percentile pressure is 10.9.  The machine is comfortable to use with the current set up, and he continues to use a nasal mask   Interval history from 30 October 2016,  I have the pleasure of seeing James [name] today who has been a compliant CPAP user. Yesterday's compliance download showed 30 days was 97% compliance in hours an average of 7 hours and 38 minutes was reached, he  is using an AutoSet between 5 and 12 cmH2O was 3 cm EPR his residual AHI is reduced to 0.5/h, is 95th percentile pressure is 11.7.  There are no central apneas emerging, his fatigue severity score is low at 17 point and his Epworth Sleepiness Scale was endorsed at six-points. He is very happy with CPAP and it's positive effects on alertness.   REVIEW OF SYSTEMS: Out of a complete 14 system review of symptoms, the patient complains only of the following symptoms, anxiety/depression and all other reviewed systems are negative.  ESS: 15 FSS: 22  ALLERGIES: Allergies  Allergen Reactions  . Ibuprofen Other (See Comments) and Anaphylaxis    Upper GI Bled  . Nsaids     HOME MEDICATIONS: Outpatient Medications Prior to Visit  Medication Sig Dispense Refill  . amLODipine (NORVASC) 5 MG tablet Take 5 mg by mouth 2 (two) times daily.     . benzonatate (TESSALON) 100 MG capsule benzonatate 100 mg capsule  TAKE 1 CAP BY MOUTH UP TO 3 TIMES DAILY AS NEEDED FOR COUGH    . buPROPion (WELLBUTRIN XL) 150 MG 24 hr tablet Take 150 mg by mouth daily. Take 2 tablets total of 300 mg in the morning.  5  . cephALEXin (KEFLEX) 500 MG capsule cephalexin 500 mg capsule  TAKE 1 CAPSULE BY MOUTH EVERY 6 HOURS FOR 7 DAYS AS DIRECTED    . [facility] B-12 500 MCG SUBL Place 2 tablets under the tongue daily with breakfast.   12  . desmopressin (DDAVP) 0.2 MG tablet Take 0.4 mg by mouth 2 (two) times daily.     . fluticasone (FLONASE) 50 MCG/ACT nasal spray Place 1 spray into both nostrils 2 (two) times daily.    [name] glimepiride (AMARYL) 2 MG tablet Take 2-4 mg by mouth 2 (two) times daily. Take 76ms in the morning and 237m at night    . glucose blood test strip OneTouch Ultra Blue Test Strip  USE TO TEST BLOOD SUGAR ONCE DAILY    . hydrochlorothiazide (HYDRODIURIL) 25 MG tablet Take 25 mg by mouth  every morning.    [name] LORazepam (ATIVAN) 0.5 MG tablet Take 0.5 mg by mouth daily.   3  . metFORMIN (GLUCOPHAGE-XR) 500 MG 24 hr  tablet Take 500 mg by mouth 2 (two) times daily.     [name] moxifloxacin (VIGAMOX) 0.5 % ophthalmic solution PLACE 1 DROP IN RIGHT EYE FOUR TIMES A DAY USE THE DAY BEFORE, DAY OF, AND DAY AFTER TREATMENT.  5  . ONE TOUCH ULTRA TEST test strip by Other route as needed.     . pantoprazole (PROTONIX) 40 MG tablet TAKE 1 TABLET (40 MG TOTAL) BY MOUTH DAILY BEFORE BREAKFAST (MAX DAYS #30 ON INS) 30 tablet 5  . PRESCRIPTION MEDICATION every 8 (eight) weeks. Gets injections in right eye at dr's office every 8 weeks    . rosuvastatin (CRESTOR) 20 MG tablet Take 20 mg by mouth at bedtime.     [name] testosterone cypionate (DEPOTESTOSTERONE CYPIONATE) 200 MG/ML injection Inject 150 mg into the muscle every 21 ( twenty-one) days.     [name] VIAGRA 100 MG tablet Take 100 mg by mouth daily as needed for erectile dysfunction.   1  . XARELTO 10 MG TABS tablet TAKE 1 TABLET BY MOUTH EVERY DAY 30 tablet 11  . zolpidem (AMBIEN) 10 MG tablet Take 10 mg by mouth at bedtime as needed for sleep.     No facility-administered medications prior to visit.    PAST MEDICAL HISTORY: Past Medical History:  Diagnosis Date  . Anxiety   . Arthritis   . Clotting disorder ([place])   . Depression   . DM (diabetes mellitus) ([place])   . GERD (gastroesophageal reflux disease)   . Hiatal hernia   . History of shingles 04/03/2014  . HTN (hypertension)   . Hyperlipidemia   . Insomnia    resolved by using CPAP  . Iron deficiency anemia due to chronic blood loss 11/20/2016  . Macular degeneration   . Nocturia more than twice per night 11/11/2014   For 6 month, 5 nocturias a night.   . Obesity   . 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 Portal vein thrombosis   . Rhinitis   . Situational depression   . Sleep apnea    uses CPAP every night  . Tubular adenoma 12/10/2015   6 cecum polyps    PAST SURGICAL HISTORY: Past Surgical History:  Procedure Laterality Date  . CARDIAC CATHETERIZATION     greater 10 yrs ago, normal  . CARPAL TUNNEL  RELEASE     bilateral  . COLONOSCOPY  06/2005   [facility] Dr Lajoyce Corners  . ESOPHAGOGASTRODUODENOSCOPY (EGD) WITH PROPOFOL N/A 09/28/2016   Procedure: ESOPHAGOGASTRODUODENOSCOPY (EGD) WITH PROPOFOL;  Surgeon: Gatha Mayer, MD;  Location: [facility];  Service: Endoscopy;  Laterality: N/A;  . IR GENERIC HISTORICAL  12/12/2016   IR US GUIDE VASC ACCESS RIGHT 12/12/2016 [name], MD [facility]  . IR GENERIC HISTORICAL  12/12/2016   IR VENOGRAM HEPATIC W HEMODYNAMIC EVALUATION 12/12/2016 [name], MD [facility]  . IR GENERIC HISTORICAL  12/12/2016   IR TRANSCATHETER BX 12/12/2016 [name], MD [facility]  . ROTATOR CUFF REPAIR     right  . TENDON REPAIR     left arm  . TONSILLECTOMY    . TOTAL KNEE ARTHROPLASTY Right 2010  . WISDOM TOOTH EXTRACTION      FAMILY HISTORY: Family History  Problem Relation Age of Onset  . Colon cancer Other   . Heart failure Other   .  Heart attack Other   . Lung disease Father        history  . Diabetes Mother        history  . Stroke Mother        history  . Colon polyps Neg Hx   . Rectal cancer Neg Hx   . Stomach cancer Neg Hx   . Esophageal cancer Neg Hx     SOCIAL HISTORY: Social History   Socioeconomic History  . Marital status: Married    Spouse name: Not on file  . Number of children: 1  . Years of education: Not on file  . Highest education level: Not on file  Occupational History  . Not on file  Tobacco Use  . Smoking status: Never Smoker  . Smokeless tobacco: Never Used  . Tobacco comment: never used tobacco  Substance and Sexual Activity  . Alcohol use: Yes    Alcohol/week: 0.0 standard drinks  . Drug use: No  . Sexual activity: Not on file  Other Topics Concern  . Not on file  Social History Narrative   Right handed.  Caffeine 3 cups daily,  Married 2 kids (one deceased).  College grad.  [employer].    Social Determinants of Health   Financial Resource Strain:   .  Difficulty of Paying Living Expenses: Not on file  Food Insecurity:   . Worried About Charity fundraiser in the Last Year: Not on file  . Ran Out of Food in the Last Year: Not on file  Transportation Needs:   . Lack of Transportation (Medical): Not on file  . Lack of Transportation (Non-Medical): Not on file  Physical Activity:   . Days of Exercise per Week: Not on file  . Minutes of Exercise per Session: Not on file  Stress:   . Feeling of Stress : Not on file  Social Connections:   . Frequency of Communication with Friends and Family: Not on file  . Frequency of Social Gatherings with Friends and Family: Not on file  . Attends Religious Services: Not on file  . Active Member of Clubs or Organizations: Not on file  . Attends Archivist Meetings: Not on file  . Marital Status: Not on file  Intimate Partner Violence:   . Fear of Current or Ex-Partner: Not on file  . Emotionally Abused: Not on file  . Physically Abused: Not on file  . Sexually Abused: Not on file      PHYSICAL EXAM  Vitals:   12/09/19 1416  BP: 131/82  Pulse: 83  Temp: 97.8 F (36.6 C)  Weight: 213 lb 3.2 oz (96.7 kg)  Height: 5' 6"  (1.676 m)   Body mass index is 34.41 kg/m.  Generalized: Well developed, in no acute distress  Cardiology: normal rate and rhythm, no murmur noted Respiratory: clear to auscultation bilaterally  Mallampati: 3+ Neck circumference: 14.5" Neurological examination  Mentation: Alert oriented to time, place, history taking. Follows all commands speech and language fluent Cranial nerve II-XII: Pupils were equal round reactive to light. Extraocular movements were full, visual field were full on confrontational test. Facial sensation and strength were normal. Uvula tongue midline. Head turning and shoulder shrug  were normal and symmetric. Motor: The motor testing reveals 5 over 5 strength of all 4 extremities. Good symmetric motor tone is noted throughout.  Gait and  station: Gait is wide   DIAGNOSTIC DATA (LABS, IMAGING, TESTING) - I reviewed patient records, labs, notes,  testing and imaging myself where available.  No flowsheet data found.   Lab Results  Component Value Date   WBC 9.4 11/05/2019   HGB 12.4 (L) 11/05/2019   HCT 44.2 11/05/2019   MCV 67.0 (L) 11/05/2019   PLT 410 (H) 11/05/2019      Component Value Date/Time   NA 136 11/05/2019 1433   NA 137 10/29/2017 1307   NA 134 (L) 05/10/2017 0755   K 4.0 11/05/2019 1433   K 3.6 10/29/2017 1307   K 4.2 05/10/2017 0755   CL 101 11/05/2019 1433   CL 101 10/29/2017 1307   CO2 26 11/05/2019 1433   CO2 25 10/29/2017 1307   CO2 20 (L) 05/10/2017 0755   GLUCOSE 198 (H) 11/05/2019 1433   GLUCOSE 200 (H) 10/29/2017 1307   BUN 18 11/05/2019 1433   BUN 14 10/29/2017 1307   BUN 21.0 05/10/2017 0755   CREATININE 1.43 (H) 11/05/2019 1433   CREATININE 1.4 (H) 10/29/2017 1307   CREATININE 1.3 05/10/2017 0755   CALCIUM 10.5 (H) 11/05/2019 1433   CALCIUM 10.3 10/29/2017 1307   CALCIUM 10.3 05/10/2017 0755   PROT 6.5 11/05/2019 1433   PROT 6.5 10/29/2017 1307   PROT 6.9 05/10/2017 0755   ALBUMIN 4.6 11/05/2019 1433   ALBUMIN 3.6 10/29/2017 1307   ALBUMIN 3.9 05/10/2017 0755   AST 20 11/05/2019 1433   AST 25 05/10/2017 0755   ALT 18 11/05/2019 1433   ALT 27 10/29/2017 1307   ALT 26 05/10/2017 0755   ALKPHOS 86 11/05/2019 1433   ALKPHOS 77 10/29/2017 1307   ALKPHOS 97 05/10/2017 0755   BILITOT 0.6 11/05/2019 1433   BILITOT 0.70 05/10/2017 0755   GFRNONAA 48 (L) 11/05/2019 1433   GFRAA 56 (L) 11/05/2019 1433   No results found for: CHOL, HDL, LDLCALC, LDLDIRECT, TRIG, CHOLHDL Lab Results  Component Value Date   HGBA1C 7.5 (H) 04/30/2016   No results found for: VITAMINB12 No results found for: TSH     ASSESSMENT AND PLAN 73 y.o. year old male  has a past medical history of Anxiety, Arthritis, Clotting disorder ([place]), Depression, DM (diabetes mellitus) ([place]), GERD  (gastroesophageal reflux disease), Hiatal hernia, History of shingles (04/03/2014), HTN (hypertension), Hyperlipidemia, Insomnia, Iron deficiency anemia due to chronic blood loss (11/20/2016), Macular degeneration, Nocturia more than twice per night (11/11/2014), Obesity, Polycythemia vera(238.4), Portal vein thrombosis, Rhinitis, Situational depression, Sleep apnea, and Tubular adenoma (12/10/2015). here with     ICD-10-CM   1. OSA on CPAP  G47.33 For home use only DME continuous positive airway pressure (CPAP)   Z99.89     Antawn continues to do fairly well with CPAP therapy. He does admit that he often drifts off to sleep prior to placing CPAP. He will work on making sure that mask is in place if he feels he may fall asleep. He was encouraged to use CPAP nightly and for greater than 4 hours each night. We will order new CPAP machine as he is having difficulty with his power cord and error messages. He will follow up in 31-90 days following new CPAP initiation. He verbalizes understanding and agreement with this plan.    Orders Placed This Encounter  Procedures  . For home use only DME continuous positive airway pressure (CPAP)    New CPAP with current setting of minimum pressure 5cmH20, maximum pressure 12cmH20 and EPR 3.    Order Specific Question:   Length of Need    Answer:   Lifetime  Order Specific Question:   Patient has OSA or probable OSA    Answer:   Yes    Order Specific Question:   Is the patient currently using CPAP in the home    Answer:   Yes    Order Specific Question:   Settings    Answer:   Other see comments    Order Specific Question:   CPAP supplies needed    Answer:   Mask, headgear, cushions, filters, heated tubing and water chamber     No orders of the defined types were placed in this encounter.     I spent 15 minutes with the patient. 50% of this time was spent counseling and educating patient on plan of care and medications.    Debbora Presto, FNP-C 12/09/2019,  2:26 PM [facility] [address], [place], [place] [address] [phone]

## 2019-12-09 NOTE — Progress Notes (Signed)
Received that [facility] received order and will process.

## 2019-12-10 ENCOUNTER — Telehealth: Payer: Self-pay | Admitting: *Deleted

## 2019-12-10 NOTE — Telephone Encounter (Signed)
Call received from patient stating that his insurance will no longer cover Xarelto and would like to know an alternative.  Patient notified to contact his insurance to see what medication is covered in place of Xarelto and to call office back with alternative.  Pt appreciative of assistance and will contact his insurance regarding replacement for Xarelto.

## 2019-12-15 ENCOUNTER — Telehealth: Payer: Self-pay | Admitting: Family Medicine

## 2019-12-15 ENCOUNTER — Ambulatory Visit: Payer: 59 | Admitting: Adult Health

## 2019-12-15 NOTE — Telephone Encounter (Signed)
Pt called wanting to know if the RN can give him the number to the agency that will be replacing his cpap. Pt states he wrote number down wrong. Please advise.

## 2019-12-15 NOTE — Telephone Encounter (Signed)
I called and LMVM for pt that the # for [facility] is [phone].  He is to call back if needed.

## 2020-01-07 ENCOUNTER — Ambulatory Visit: Payer: 59 | Attending: Internal Medicine

## 2020-01-07 DIAGNOSIS — Z23 Encounter for immunization: Secondary | ICD-10-CM

## 2020-01-07 NOTE — Progress Notes (Signed)
   [id number]  Name:  James [name].    MRN: [id number] DOB: Jun 13, 1946  01/07/2020  [name] was observed post Covid-19 immunization for 15 minutes without incidence. He was provided with Vaccine Information Sheet and instruction to access the V-Safe system.   [name] was instructed to call 911 with any severe reactions post vaccine: [name] Difficulty breathing  . Swelling of your face and throat  . A fast heartbeat  . A bad rash all over your body  . Dizziness and weakness    Immunizations Administered    Name Date Dose VIS Date Route   Pfizer COVID-19 Vaccine 01/07/2020  8:43 AM 0.3 mL 11/28/2019 Intramuscular   Manufacturer: [place]   Lot: [id number]   [place]: [id number]

## 2020-01-25 ENCOUNTER — Ambulatory Visit: Payer: 59 | Attending: Internal Medicine

## 2020-01-25 DIAGNOSIS — Z23 Encounter for immunization: Secondary | ICD-10-CM | POA: Insufficient documentation

## 2020-01-25 NOTE — Progress Notes (Signed)
   [id number]  Name:  James [name].    MRN: [id number] DOB: 1946-10-18  01/25/2020  Mr. Goelz was observed post Covid-19 immunization for 15 minutes without incidence. He was provided with Vaccine Information Sheet and instruction to access the V-Safe system.   Mr. Chiou was instructed to call 911 with any severe reactions post vaccine: [name] Difficulty breathing  . Swelling of your face and throat  . A fast heartbeat  . A bad rash all over your body  . Dizziness and weakness    Immunizations Administered    Name Date Dose VIS Date Route   Pfizer COVID-19 Vaccine 01/25/2020  1:31 PM 0.3 mL 11/28/2019 Intramuscular   Manufacturer: [place]   Lot: [id number]   [place]: [id number]

## 2020-01-27 ENCOUNTER — Telehealth: Payer: Self-pay

## 2020-01-27 NOTE — Telephone Encounter (Signed)
Patient called stating that insurance is requiring OV notes for a new cpap machine for 2021 as in December it was too soon to cover.

## 2020-01-28 ENCOUNTER — Telehealth: Payer: Self-pay | Admitting: *Deleted

## 2020-01-28 NOTE — Telephone Encounter (Signed)
Pt request phone call from nurse not understanding why he is schedule for a office visit in March. Please [phone]

## 2020-01-29 NOTE — Telephone Encounter (Signed)
Called the pt back. Pt was ordered a new machine in dec at the last visit. The apt was made for 3/22 to be in place for initial CPAP visit. I will verify with [facility] to make sure that this date falls within the time frame of the initial CPAP set up. Patient has written that time of scheduled apt down on his calendar.

## 2020-01-29 NOTE — Telephone Encounter (Signed)
Reached out to [facility] because the patient had mentioned they were stating he needed to be seen sooner. I had not received a message indicating this so I reached out to [facility] to make sure everything was ok on our end.   "So the issue is that he went to [facility]. His notes for the new machine are from 12/15/19. We need notes (televisit is fine) AFTER pt became Medicare effective for billing purposes. He's already been set up, this is just a billing thing."  He is following up March 08 2020 which was scheduled for initial CPAP follow up. I called the patient and was able to get him scheduled next thur on 2/18 with [facility] basically so we can document that he is compliant and will start a new CPAP machine. Pt verbalized understanding. For now I will leave the patient on schedule for the 3/22 apt as well as that will be needed to be technically "initial CPAP follow up". Advised the patient that if we have to push that out for any reason that I will contact him and let him know.

## 2020-02-04 ENCOUNTER — Inpatient Hospital Stay: Payer: Medicare Other

## 2020-02-04 ENCOUNTER — Inpatient Hospital Stay: Payer: Medicare Other | Attending: Hematology & Oncology | Admitting: Hematology & Oncology

## 2020-02-04 ENCOUNTER — Other Ambulatory Visit: Payer: Self-pay

## 2020-02-04 ENCOUNTER — Encounter: Payer: Self-pay | Admitting: Hematology & Oncology

## 2020-02-04 ENCOUNTER — Telehealth: Payer: Self-pay | Admitting: Adult Health

## 2020-02-04 VITALS — BP 143/83 | HR 86 | Temp 97.3°F | Resp 19 | Wt 213.0 lb

## 2020-02-04 DIAGNOSIS — I81 Portal vein thrombosis: Secondary | ICD-10-CM | POA: Diagnosis not present

## 2020-02-04 DIAGNOSIS — D500 Iron deficiency anemia secondary to blood loss (chronic): Secondary | ICD-10-CM | POA: Diagnosis not present

## 2020-02-04 DIAGNOSIS — D45 Polycythemia vera: Secondary | ICD-10-CM

## 2020-02-04 LAB — CBC WITH DIFFERENTIAL (CANCER CENTER ONLY)
Abs Immature Granulocytes: 0.06 10*3/uL (ref 0.00–0.07)
Basophils Absolute: 0.3 10*3/uL — ABNORMAL HIGH (ref 0.0–0.1)
Basophils Relative: 3 %
Eosinophils Absolute: 0.2 10*3/uL (ref 0.0–0.5)
Eosinophils Relative: 2 %
HCT: 45.2 % (ref 39.0–52.0)
Hemoglobin: 13.2 g/dL (ref 13.0–17.0)
Immature Granulocytes: 1 %
Lymphocytes Relative: 15 %
Lymphs Abs: 1.5 10*3/uL (ref 0.7–4.0)
MCH: 20.6 pg — ABNORMAL LOW (ref 26.0–34.0)
MCHC: 29.2 g/dL — ABNORMAL LOW (ref 30.0–36.0)
MCV: 70.5 fL — ABNORMAL LOW (ref 80.0–100.0)
Monocytes Absolute: 0.9 10*3/uL (ref 0.1–1.0)
Monocytes Relative: 9 %
Neutro Abs: 7.3 10*3/uL (ref 1.7–7.7)
Neutrophils Relative %: 70 %
Platelet Count: 357 10*3/uL (ref 150–400)
RBC: 6.41 MIL/uL — ABNORMAL HIGH (ref 4.22–5.81)
RDW: 23.2 % — ABNORMAL HIGH (ref 11.5–15.5)
WBC Count: 10.2 10*3/uL (ref 4.0–10.5)
nRBC: 0 % (ref 0.0–0.2)

## 2020-02-04 LAB — RETICULOCYTES
Immature Retic Fract: 34.4 % — ABNORMAL HIGH (ref 2.3–15.9)
RBC.: 6.29 MIL/uL — ABNORMAL HIGH (ref 4.22–5.81)
Retic Count, Absolute: 101.3 10*3/uL (ref 19.0–186.0)
Retic Ct Pct: 1.6 % (ref 0.4–3.1)

## 2020-02-04 LAB — CMP (CANCER CENTER ONLY)
ALT: 21 U/L (ref 0–44)
AST: 18 U/L (ref 15–41)
Albumin: 4.6 g/dL (ref 3.5–5.0)
Alkaline Phosphatase: 83 U/L (ref 38–126)
Anion gap: 9 (ref 5–15)
BUN: 19 mg/dL (ref 8–23)
CO2: 23 mmol/L (ref 22–32)
Calcium: 11.1 mg/dL — ABNORMAL HIGH (ref 8.9–10.3)
Chloride: 105 mmol/L (ref 98–111)
Creatinine: 1.27 mg/dL — ABNORMAL HIGH (ref 0.61–1.24)
GFR, Est AFR Am: >60 mL/min (ref >60)
GFR, Estimated: 55 mL/min — ABNORMAL LOW (ref >60)
Glucose, Bld: 168 mg/dL — ABNORMAL HIGH (ref 70–99)
Potassium: 3.8 mmol/L (ref 3.5–5.1)
Sodium: 137 mmol/L (ref 135–145)
Total Bilirubin: 0.6 mg/dL (ref 0.3–1.2)
Total Protein: 6.8 g/dL (ref 6.5–8.1)

## 2020-02-04 NOTE — Progress Notes (Signed)
Hematology and Oncology Follow Up Visit  James [name] [id number] 1946/11/21 74 y.o. 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 IV iron as indicated - last received in January 2020      Interim History:  James [name] is here today for a follow-up.  He is doing okay.  He is now retired.  He had a quiet holiday season with his family.  He is actually doing some Pilates right now.  He is having some balance issues.  He says of the Pilates help him out.  He has had no problems with bleeding.  There is no bruising.  He has had no fever.  He has been very cautious with the coronavirus.  There is been no abdominal pain.  He is on Xarelto.  He is doing well with the Xarelto.  He has had no change in bowel or bladder habits.  He has had no leg swelling.  There is no cough or shortness of breath.   Overall, his performance status is ECOG 0.   Medications:  Allergies as of 02/04/2020      Reactions   Ibuprofen Other (See Comments), Anaphylaxis   Upper GI Bled   Nsaids       Medication List       Accurate as of February 04, 2020  3:25 PM. If you have any questions, ask your nurse or doctor.        amLODipine 5 MG tablet Commonly known as: NORVASC Take 5 mg by mouth 2 (two) times daily.   benzonatate 100 MG capsule Commonly known as: TESSALON benzonatate 100 mg capsule  TAKE 1 CAP BY MOUTH UP TO 3 TIMES DAILY AS NEEDED FOR COUGH   buPROPion 150 MG 24 hr tablet Commonly known as: WELLBUTRIN XL Take 150 mg by mouth daily. Take 2 tablets total of 300 mg in the morning.   cephALEXin 500 MG capsule Commonly known as: KEFLEX cephalexin 500 mg capsule  TAKE 1 CAPSULE BY MOUTH EVERY 6 HOURS FOR 7 DAYS AS DIRECTED   [employer] B-12 500 MCG Subl Generic drug: Cyanocobalamin Place 2 tablets under the tongue daily with  breakfast.   desmopressin 0.2 MG tablet Commonly known as: DDAVP Take 0.4 mg by mouth 2 (two) times daily.   fluticasone 50 MCG/ACT nasal spray Commonly known as: FLONASE Place 1 spray into both nostrils 2 (two) times daily.   glimepiride 2 MG tablet Commonly known as: AMARYL Take 2-4 mg by mouth 2 (two) times daily. Take 26ms in the morning and 269m at night   hydrochlorothiazide 25 MG tablet Commonly known as: HYDRODIURIL Take 25 mg by mouth every morning.   LORazepam 0.5 MG tablet Commonly known as: ATIVAN Take 0.5 mg by mouth daily.   metFORMIN 500 MG 24 hr tablet Commonly known as: GLUCOPHAGE-XR Take 500 mg by mouth 2 (two) times daily.   moxifloxacin 0.5 % ophthalmic solution Commonly known as: VIGAMOX PLACE 1 DROP IN RIGHT EYE FOUR TIMES A DAY USE THE DAY BEFORE, DAY OF, AND DAY AFTER TREATMENT.   glucose blood test strip OneTouch Ultra Blue Test Strip  USE TO TEST BLOOD SUGAR ONCE DAILY   ONE TOUCH ULTRA TEST test strip Generic drug: glucose blood by Other route as needed.   pantoprazole 40 MG tablet Commonly known as: PROTONIX TAKE 1 TABLET (40 MG TOTAL)  BY MOUTH DAILY BEFORE BREAKFAST (MAX DAYS #30 ON INS)   PRESCRIPTION MEDICATION every 8 (eight) weeks. Gets injections in right eye at dr's office every 8 weeks   rosuvastatin 20 MG tablet Commonly known as: CRESTOR Take 20 mg by mouth at bedtime.   testosterone cypionate 200 MG/ML injection Commonly known as: DEPOTESTOSTERONE CYPIONATE Inject 150 mg into the muscle every 21 ( twenty-one) days.   Viagra 100 MG tablet Generic drug: sildenafil Take 100 mg by mouth daily as needed for erectile dysfunction.   Xarelto 10 MG Tabs tablet Generic drug: rivaroxaban TAKE 1 TABLET BY MOUTH EVERY DAY   zolpidem 10 MG tablet Commonly known as: AMBIEN Take 10 mg by mouth at bedtime as needed for sleep.       Allergies:  Allergies  Allergen Reactions  . Ibuprofen Other (See Comments) and Anaphylaxis     Upper GI Bled  . Nsaids     Past Medical History, Surgical history, Social history, and Family History were reviewed and updated.  Review of Systems: Review of Systems  Constitutional: Negative.   HENT: Negative.   Eyes: Negative.   Respiratory: Negative.   Cardiovascular: Negative.   Gastrointestinal: Negative.   Genitourinary: Negative.   Musculoskeletal: Negative.   Skin: Negative.   Neurological: Negative.   Endo/Heme/Allergies: Negative.   Psychiatric/Behavioral: Negative.     Physical Exam:  weight is 213 lb (96.6 kg). His temporal temperature is 97.3 F (36.3 C) (abnormal). His blood pressure is 143/83 (abnormal) and his pulse is 86. His respiration is 19 and oxygen saturation is 93%.   Wt Readings from Last 3 Encounters:  02/04/20 213 lb (96.6 kg)  12/09/19 213 lb 3.2 oz (96.7 kg)  11/05/19 218 lb 6.4 oz (99.1 kg)    Physical Exam Vitals reviewed.  HENT:     Head: Normocephalic and atraumatic.  Eyes:     Pupils: Pupils are equal, round, and reactive to light.  Cardiovascular:     Rate and Rhythm: Normal rate and regular rhythm.     Heart sounds: Normal heart sounds.  Pulmonary:     Effort: Pulmonary effort is normal.     Breath sounds: Normal breath sounds.  Abdominal:     General: Bowel sounds are normal.     Palpations: Abdomen is soft.  Musculoskeletal:        General: No tenderness or deformity. Normal range of motion.     Cervical back: Normal range of motion.  Lymphadenopathy:     Cervical: No cervical adenopathy.  Skin:    General: Skin is warm and dry.     Findings: No erythema or rash.  Neurological:     Mental Status: He is alert and oriented to person, place, and time.  Psychiatric:        Behavior: Behavior normal.        Thought Content: Thought content normal.        Judgment: Judgment normal.      Lab Results  Component Value Date   WBC 10.2 02/04/2020   HGB 13.2 02/04/2020   HCT 45.2 02/04/2020   MCV 70.5 (L) 02/04/2020    PLT 357 02/04/2020   Lab Results  Component Value Date   FERRITIN 8 (L) 11/05/2019   IRON 20 (L) 11/05/2019   TIBC 444 (H) 11/05/2019   UIBC 424 (H) 11/05/2019   IRONPCTSAT 4 (L) 11/05/2019   Lab Results  Component Value Date   RETICCTPCT 1.6 02/04/2020   RBC 6.29 (H) 02/04/2020  RETICCTABS 94.0 08/30/2011   No results found for: Nils Pyle [facility] Lab Results  Component Value Date   POIPPGFQ 421 10/11/2016   IGA 177 06/22/2016   No results found for: Odetta Pink, SPEI   Chemistry      Component Value Date/Time   NA 136 11/05/2019 1433   NA 137 10/29/2017 1307   NA 134 (L) 05/10/2017 0755   K 4.0 11/05/2019 1433   K 3.6 10/29/2017 1307   K 4.2 05/10/2017 0755   CL 101 11/05/2019 1433   CL 101 10/29/2017 1307   CO2 26 11/05/2019 1433   CO2 25 10/29/2017 1307   CO2 20 (L) 05/10/2017 0755   BUN 18 11/05/2019 1433   BUN 14 10/29/2017 1307   BUN 21.0 05/10/2017 0755   CREATININE 1.43 (H) 11/05/2019 1433   CREATININE 1.4 (H) 10/29/2017 1307   CREATININE 1.3 05/10/2017 0755      Component Value Date/Time   CALCIUM 10.5 (H) 11/05/2019 1433   CALCIUM 10.3 10/29/2017 1307   CALCIUM 10.3 05/10/2017 0755   ALKPHOS 86 11/05/2019 1433   ALKPHOS 77 10/29/2017 1307   ALKPHOS 97 05/10/2017 0755   AST 20 11/05/2019 1433   AST 25 05/10/2017 0755   ALT 18 11/05/2019 1433   ALT 27 10/29/2017 1307   ALT 26 05/10/2017 0755   BILITOT 0.6 11/05/2019 1433   BILITOT 0.70 05/10/2017 0755     Impression and Plan: Mr. Repsher is 74 year old gentleman with polycythemia. He is JAK2 negative.  He does not need to be phlebotomized today.  He does not need any iron.  This is a true blessing.  We will see him back in 4 months.    [name], MD 2/17/20213:25 PM

## 2020-02-04 NOTE — Telephone Encounter (Signed)
I called patient and LVM to reschedule 2/18 due to inclement weather/office closure. Requested patient call back to reschedule.

## 2020-02-05 ENCOUNTER — Encounter: Payer: Self-pay | Admitting: *Deleted

## 2020-02-05 ENCOUNTER — Ambulatory Visit: Payer: Self-pay | Admitting: Adult Health

## 2020-02-05 LAB — IRON AND TIBC
Iron: 27 ug/dL — ABNORMAL LOW (ref 42–163)
Saturation Ratios: 6 % — ABNORMAL LOW (ref 20–55)
TIBC: 469 ug/dL — ABNORMAL HIGH (ref 202–409)
UIBC: 442 ug/dL — ABNORMAL HIGH (ref 117–376)

## 2020-02-05 LAB — FERRITIN: Ferritin: 5 ng/mL — ABNORMAL LOW (ref 24–336)

## 2020-02-16 ENCOUNTER — Encounter: Payer: Self-pay | Admitting: Adult Health

## 2020-02-16 ENCOUNTER — Ambulatory Visit (INDEPENDENT_AMBULATORY_CARE_PROVIDER_SITE_OTHER): Payer: Medicare Other | Admitting: Adult Health

## 2020-02-16 ENCOUNTER — Other Ambulatory Visit: Payer: Self-pay

## 2020-02-16 VITALS — BP 118/80 | HR 98 | Temp 97.8°F | Ht 66.0 in | Wt 218.2 lb

## 2020-02-16 DIAGNOSIS — Z9989 Dependence on other enabling machines and devices: Secondary | ICD-10-CM | POA: Diagnosis not present

## 2020-02-16 DIAGNOSIS — G4733 Obstructive sleep apnea (adult) (pediatric): Secondary | ICD-10-CM | POA: Diagnosis not present

## 2020-02-16 NOTE — Patient Instructions (Addendum)
Your Plan:  Continue to use CPAP nightly  Speak to PCP about O2-- 92% If your symptoms worsen or you develop new symptoms please let us know.   Thank you for coming to see [place] at [facility]. I hope we have been able to provide you high quality care today.  You may receive a patient satisfaction survey over the next few weeks. We would appreciate your feedback and comments so that we may continue to improve ourselves and the health of our patients.

## 2020-02-16 NOTE — Progress Notes (Signed)
PATIENT: James [name]. DOB: Dec 05, 1946  REASON FOR VISIT: follow up HISTORY FROM: patient  HISTORY OF PRESENT ILLNESS: Today 02/16/20   [name] is a 74 year old male with a history of obstructive sleep apnea on CPAP.  He returns today for follow-up.  His download indicates that he use his machine 28 out of 30 days for compliance of 93%.  He uses machine greater than 4 hours 19 days for compliance of 63%.  On average he uses his machine 4 hours and 58 minutes.  His residual AHI is 1.3 on 5 to 12 cm of water with EPR 3.  Leak in the 95th percentile is 15 L/min.  He recently got a new machine on the 17th.  Reports that he had to set it up himself.  We are unable to get this data.  We will recheck the DME company today.  HISTORY 12/09/19 James [name]. is a 74 y.o. male here today for follow up for OSA on CPAP. He is doing well. He is using CPAP nightly. Sometimes he drifts off to sleep prior to placing therapy. He was recently tested for COVID-19 on 11/28/2019.  Results were negative.  He would like to get a new CPAP machine. He reports that power cord will not stay connected. He is also noting error messages of leaks but he does not feel this is accurate. Last RX was 12/16/2014.    REVIEW OF SYSTEMS: Out of a complete 14 system review of symptoms, the patient complains only of the following symptoms, and all other reviewed systems are negative.  Epworth sleepiness score 13 fatigue severity score 29  ALLERGIES: Allergies  Allergen Reactions  . Ibuprofen Other (See Comments) and Anaphylaxis    Upper GI Bled  . Nsaids     HOME MEDICATIONS: Outpatient Medications Prior to Visit  Medication Sig Dispense Refill  . amLODipine (NORVASC) 5 MG tablet Take 5 mg by mouth 2 (two) times daily.     . benzonatate (TESSALON) 100 MG capsule benzonatate 100 mg capsule  TAKE 1 CAP BY MOUTH UP TO 3 TIMES DAILY AS NEEDED FOR COUGH    . buPROPion (WELLBUTRIN XL) 150 MG 24 hr tablet Take 150 mg by  mouth daily. Take 2 tablets total of 300 mg in the morning.  5  . cephALEXin (KEFLEX) 500 MG capsule cephalexin 500 mg capsule  TAKE 1 CAPSULE BY MOUTH EVERY 6 HOURS FOR 7 DAYS AS DIRECTED    . [facility] B-12 500 MCG SUBL Place 2 tablets under the tongue daily with breakfast.   12  . desmopressin (DDAVP) 0.2 MG tablet Take 0.4 mg by mouth 2 (two) times daily.     . fluticasone (FLONASE) 50 MCG/ACT nasal spray Place 1 spray into both nostrils 2 (two) times daily.    [name] glimepiride (AMARYL) 2 MG tablet Take 2-4 mg by mouth 2 (two) times daily. Take 58ms in the morning and 210m at night    . glucose blood test strip OneTouch Ultra Blue Test Strip  USE TO TEST BLOOD SUGAR ONCE DAILY    . hydrochlorothiazide (HYDRODIURIL) 25 MG tablet Take 25 mg by mouth every morning.    . Marland KitchenORazepam (ATIVAN) 0.5 MG tablet Take 0.5 mg by mouth daily.   3  . metFORMIN (GLUCOPHAGE-XR) 500 MG 24 hr tablet Take 500 mg by mouth 2 (two) times daily.     . Marland Kitchenoxifloxacin (VIGAMOX) 0.5 % ophthalmic solution PLACE 1 DROP IN RIGHT EYE FOUR TIMES A DAY  USE THE DAY BEFORE, DAY OF, AND DAY AFTER TREATMENT.  5  . ONE TOUCH ULTRA TEST test strip by Other route as needed.     . pantoprazole (PROTONIX) 40 MG tablet TAKE 1 TABLET (40 MG TOTAL) BY MOUTH DAILY BEFORE BREAKFAST (MAX DAYS #30 ON INS) 30 tablet 5  . PRESCRIPTION MEDICATION every 8 (eight) weeks. Gets injections in right eye at dr's office every 8 weeks    . rosuvastatin (CRESTOR) 20 MG tablet Take 20 mg by mouth at bedtime.     [name] testosterone cypionate (DEPOTESTOSTERONE CYPIONATE) 200 MG/ML injection Inject 150 mg into the muscle every 21 ( twenty-one) days.     [name] VIAGRA 100 MG tablet Take 100 mg by mouth daily as needed for erectile dysfunction.   1  . XARELTO 10 MG TABS tablet TAKE 1 TABLET BY MOUTH EVERY DAY 30 tablet 11  . zolpidem (AMBIEN) 10 MG tablet Take 10 mg by mouth at bedtime as needed for sleep.     No facility-administered medications prior to visit.    PAST  MEDICAL HISTORY: Past Medical History:  Diagnosis Date  . Anxiety   . Arthritis   . Clotting disorder ([place])   . Depression   . DM (diabetes mellitus) ([place])   . GERD (gastroesophageal reflux disease)   . Hiatal hernia   . History of shingles 04/03/2014  . HTN (hypertension)   . Hyperlipidemia   . Insomnia    resolved by using CPAP  . Iron deficiency anemia due to chronic blood loss 11/20/2016  . Macular degeneration   . Nocturia more than twice per night 11/11/2014   For 6 month, 5 nocturias a night.   . Obesity   . 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 Portal vein thrombosis   . Rhinitis   . Situational depression   . Sleep apnea    uses CPAP every night  . Tubular adenoma 12/10/2015   6 cecum polyps    PAST SURGICAL HISTORY: Past Surgical History:  Procedure Laterality Date  . CARDIAC CATHETERIZATION     greater 10 yrs ago, normal  . CARPAL TUNNEL RELEASE     bilateral  . COLONOSCOPY  06/2005   [facility] Dr Lajoyce Corners  . ESOPHAGOGASTRODUODENOSCOPY (EGD) WITH PROPOFOL N/A 09/28/2016   Procedure: ESOPHAGOGASTRODUODENOSCOPY (EGD) WITH PROPOFOL;  Surgeon: Gatha Mayer, MD;  Location: [facility];  Service: Endoscopy;  Laterality: N/A;  . IR GENERIC HISTORICAL  12/12/2016   IR US GUIDE VASC ACCESS RIGHT 12/12/2016 [name], MD [facility]  . IR GENERIC HISTORICAL  12/12/2016   IR VENOGRAM HEPATIC W HEMODYNAMIC EVALUATION 12/12/2016 [name], MD [facility]  . IR GENERIC HISTORICAL  12/12/2016   IR TRANSCATHETER BX 12/12/2016 [name], MD [facility]  . ROTATOR CUFF REPAIR     right  . TENDON REPAIR     left arm  . TONSILLECTOMY    . TOTAL KNEE ARTHROPLASTY Right 2010  . WISDOM TOOTH EXTRACTION      FAMILY HISTORY: Family History  Problem Relation Age of Onset  . Colon cancer Other   . Heart failure Other   . Heart attack Other   . Lung disease Father        history  . Diabetes Mother        history  . Stroke Mother        history  . Colon  polyps Neg Hx   . Rectal cancer Neg Hx   .  Stomach cancer Neg Hx   . Esophageal cancer Neg Hx     SOCIAL HISTORY: Social History   Socioeconomic History  . Marital status: Married    Spouse name: Not on file  . Number of children: 1  . Years of education: Not on file  . Highest education level: Not on file  Occupational History  . Not on file  Tobacco Use  . Smoking status: Never Smoker  . Smokeless tobacco: Never Used  . Tobacco comment: never used tobacco  Substance and Sexual Activity  . Alcohol use: Yes    Alcohol/week: 0.0 standard drinks  . Drug use: No  . Sexual activity: Not on file  Other Topics Concern  . Not on file  Social History Narrative   Right handed.  Caffeine 3 cups daily,  Married 2 kids (one deceased).  College grad.  [employer].    Social Determinants of Health   Financial Resource Strain:   . Difficulty of Paying Living Expenses: Not on file  Food Insecurity:   . Worried About Charity fundraiser in the Last Year: Not on file  . Ran Out of Food in the Last Year: Not on file  Transportation Needs:   . Lack of Transportation (Medical): Not on file  . Lack of Transportation (Non-Medical): Not on file  Physical Activity:   . Days of Exercise per Week: Not on file  . Minutes of Exercise per Session: Not on file  Stress:   . Feeling of Stress : Not on file  Social Connections:   . Frequency of Communication with Friends and Family: Not on file  . Frequency of Social Gatherings with Friends and Family: Not on file  . Attends Religious Services: Not on file  . Active Member of Clubs or Organizations: Not on file  . Attends Archivist Meetings: Not on file  . Marital Status: Not on file  Intimate Partner Violence:   . Fear of Current or Ex-Partner: Not on file  . Emotionally Abused: Not on file  . Physically Abused: Not on file  . Sexually Abused: Not on file      PHYSICAL EXAM  Vitals:   02/16/20 0750  02/16/20 0839  BP: 118/80   Pulse: 98   Temp: 97.8 F (36.6 C)   SpO2: (!) 89% 92%  Weight: 218 lb 3.2 oz (99 kg)   Height: 5' 6"  (1.676 m)    Body mass index is 35.22 kg/m.  Generalized: Well developed, in no acute distress  Chest: Lungs clear to auscultation bilaterally  Neurological examination  Mentation: Alert oriented to time, place, history taking. Follows all commands speech and language fluent Cranial nerve II-XII: Extraocular movements were full, visual field were full on confrontational test Head turning and shoulder shrug  were normal and symmetric. Motor: The motor testing reveals 5 over 5 strength of all 4 extremities. Good symmetric motor tone is noted throughout.  Sensory: Sensory testing is intact to soft touch on all 4 extremities. No evidence of extinction is noted.  Gait and station: Gait is normal.    DIAGNOSTIC DATA (LABS, IMAGING, TESTING) - I reviewed patient records, labs, notes, testing and imaging myself where available.  Lab Results  Component Value Date   WBC 10.2 02/04/2020   HGB 13.2 02/04/2020   HCT 45.2 02/04/2020   MCV 70.5 (L) 02/04/2020   PLT 357 02/04/2020      Component Value Date/Time   NA 137 02/04/2020 1450  NA 137 10/29/2017 1307   NA 134 (L) 05/10/2017 0755   K 3.8 02/04/2020 1450   K 3.6 10/29/2017 1307   K 4.2 05/10/2017 0755   CL 105 02/04/2020 1450   CL 101 10/29/2017 1307   CO2 23 02/04/2020 1450   CO2 25 10/29/2017 1307   CO2 20 (L) 05/10/2017 0755   GLUCOSE 168 (H) 02/04/2020 1450   GLUCOSE 200 (H) 10/29/2017 1307   BUN 19 02/04/2020 1450   BUN 14 10/29/2017 1307   BUN 21.0 05/10/2017 0755   CREATININE 1.27 (H) 02/04/2020 1450   CREATININE 1.4 (H) 10/29/2017 1307   CREATININE 1.3 05/10/2017 0755   CALCIUM 11.1 (H) 02/04/2020 1450   CALCIUM 10.3 10/29/2017 1307   CALCIUM 10.3 05/10/2017 0755   PROT 6.8 02/04/2020 1450   PROT 6.5 10/29/2017 1307   PROT 6.9 05/10/2017 0755   ALBUMIN 4.6 02/04/2020 1450    ALBUMIN 3.6 10/29/2017 1307   ALBUMIN 3.9 05/10/2017 0755   AST 18 02/04/2020 1450   AST 25 05/10/2017 0755   ALT 21 02/04/2020 1450   ALT 27 10/29/2017 1307   ALT 26 05/10/2017 0755   ALKPHOS 83 02/04/2020 1450   ALKPHOS 77 10/29/2017 1307   ALKPHOS 97 05/10/2017 0755   BILITOT 0.6 02/04/2020 1450   BILITOT 0.70 05/10/2017 0755   GFRNONAA 55 (L) 02/04/2020 1450   GFRAA >60 02/04/2020 1450   No results found for: CHOL, HDL, LDLCALC, LDLDIRECT, TRIG, CHOLHDL Lab Results  Component Value Date   HGBA1C 7.5 (H) 04/30/2016   No results found for: VITAMINB12 No results found for: TSH    ASSESSMENT AND PLAN 74 y.o. year old male  has a past medical history of Anxiety, Arthritis, Clotting disorder ([place]), Depression, DM (diabetes mellitus) ([place]), GERD (gastroesophageal reflux disease), Hiatal hernia, History of shingles (04/03/2014), HTN (hypertension), Hyperlipidemia, Insomnia, Iron deficiency anemia due to chronic blood loss (11/20/2016), Macular degeneration, Nocturia more than twice per night (11/11/2014), Obesity, Polycythemia vera(238.4), Portal vein thrombosis, Rhinitis, Situational depression, Sleep apnea, and Tubular adenoma (12/10/2015). here with :   1. Obstructive sleep apnea on CPAP  The patient's CPAP download shows excellent compliance and good treatment of his apnea.  He is encouraged to continue using CPAP nightly and greater than 4 hours each night.  He has noticed O2 sat was originally 89% but did bump up to 92%.  He states that he usually hovers around 92 to 93%.  I encouraged the patient just to make his PCP aware.  He is advised that if his symptoms worsen or he develops new symptoms he should let us know.  He will follow-up in March for initial CPAP download    I spent 15 minutes with the patient. 50% of this time was spent reviewing CPAP download   Ward Givens, MSN, NP-C 02/16/2020, 8:12 AM [facility] [address], [place], [place]  [address] [phone]

## 2020-03-08 ENCOUNTER — Ambulatory Visit: Payer: 59 | Admitting: Family Medicine

## 2020-04-19 ENCOUNTER — Ambulatory Visit (INDEPENDENT_AMBULATORY_CARE_PROVIDER_SITE_OTHER): Payer: Medicare Other | Admitting: Adult Health

## 2020-04-19 ENCOUNTER — Other Ambulatory Visit: Payer: Self-pay

## 2020-04-19 ENCOUNTER — Encounter: Payer: Self-pay | Admitting: Adult Health

## 2020-04-19 VITALS — BP 134/89 | HR 84 | Temp 97.8°F | Ht 66.0 in | Wt 215.0 lb

## 2020-04-19 DIAGNOSIS — G4733 Obstructive sleep apnea (adult) (pediatric): Secondary | ICD-10-CM

## 2020-04-19 DIAGNOSIS — Z9989 Dependence on other enabling machines and devices: Secondary | ICD-10-CM | POA: Diagnosis not present

## 2020-04-19 NOTE — Progress Notes (Signed)
PATIENT: [name]. DOB: [birth year]  REASON FOR VISIT: follow up HISTORY FROM: patient  HISTORY OF PRESENT ILLNESS: Today 04/19/20:  Mr. Mangas is a 74 year old male with a history of obstructive sleep apnea on CPAP.  He returns today for follow-up.  His download indicates that he use his machine nightly for compliance of 100%.  He uses machine greater than 4 hours each night.  On average he uses his machine 7 hours and 29 minutes.  His residual AHI is 2.2 on 5 to 12 cm of water with EPR 3.  Leak in the 95th percentile was 14.9 L/min.  He returns today for evaluation.  HISTORY 02/16/20   [name] is a 74 year old male with a history of obstructive sleep apnea on CPAP.  He returns today for follow-up.  His download indicates that he use his machine 28 out of 30 days for compliance of 93%.  He uses machine greater than 4 hours 19 days for compliance of 63%.  On average he uses his machine 4 hours and 58 minutes.  His residual AHI is 1.3 on 5 to 12 cm of water with EPR 3.  Leak in the 95th percentile is 15 L/min.  He recently got a new machine on the 17th.  Reports that he had to set it up himself.  We are unable to get this data.  We will recheck the DME company today.  REVIEW OF SYSTEMS: Out of a complete 14 system review of symptoms, the patient complains only of the following symptoms, and all other reviewed systems are negative.  ESS 9  ALLERGIES: Allergies  Allergen Reactions  . Ibuprofen Other (See Comments) and Anaphylaxis    Upper GI Bled  . Nsaids     HOME MEDICATIONS: Outpatient Medications Prior to Visit  Medication Sig Dispense Refill  . amLODipine (NORVASC) 5 MG tablet Take 5 mg by mouth 2 (two) times daily.     . benzonatate (TESSALON) 100 MG capsule benzonatate 100 mg capsule  TAKE 1 CAP BY MOUTH UP TO 3 TIMES DAILY AS NEEDED FOR COUGH    . buPROPion (WELLBUTRIN XL) 150 MG 24 hr tablet Take 150 mg by mouth daily. Take 2 tablets total of 300 mg in the morning.   5  . [facility] B-12 500 MCG SUBL Place 2 tablets under the tongue daily with breakfast.   12  . desmopressin (DDAVP) 0.2 MG tablet Take 0.4 mg by mouth 2 (two) times daily.     . fluticasone (FLONASE) 50 MCG/ACT nasal spray Place 1 spray into both nostrils 2 (two) times daily.    [name] glimepiride (AMARYL) 2 MG tablet Take 2-4 mg by mouth 2 (two) times daily. Take 59ms in the morning and 258m at night    . glucose blood test strip OneTouch Ultra Blue Test Strip  USE TO TEST BLOOD SUGAR ONCE DAILY    . hydrochlorothiazide (HYDRODIURIL) 25 MG tablet Take 25 mg by mouth every morning.    . Marland KitchenORazepam (ATIVAN) 0.5 MG tablet Take 0.5 mg by mouth daily.   3  . metFORMIN (GLUCOPHAGE-XR) 500 MG 24 hr tablet Take 500 mg by mouth 2 (two) times daily.     . Marland Kitchenoxifloxacin (VIGAMOX) 0.5 % ophthalmic solution PLACE 1 DROP IN RIGHT EYE FOUR TIMES A DAY USE THE DAY BEFORE, DAY OF, AND DAY AFTER TREATMENT.  5  . ONE TOUCH ULTRA TEST test strip by Other route as needed.     . pantoprazole (PROTONIX) 40  MG tablet TAKE 1 TABLET (40 MG TOTAL) BY MOUTH DAILY BEFORE BREAKFAST (MAX DAYS #30 ON INS) 30 tablet 5  . PRESCRIPTION MEDICATION every 8 (eight) weeks. Gets injections in right eye at dr's office every 8 weeks    . rosuvastatin (CRESTOR) 20 MG tablet Take 20 mg by mouth at bedtime.     [name] testosterone cypionate (DEPOTESTOSTERONE CYPIONATE) 200 MG/ML injection Inject 150 mg into the muscle every 21 ( twenty-one) days.     [name] VIAGRA 100 MG tablet Take 100 mg by mouth daily as needed for erectile dysfunction.   1  . XARELTO 10 MG TABS tablet TAKE 1 TABLET BY MOUTH EVERY DAY 30 tablet 11  . cephALEXin (KEFLEX) 500 MG capsule cephalexin 500 mg capsule  TAKE 1 CAPSULE BY MOUTH EVERY 6 HOURS FOR 7 DAYS AS DIRECTED    . zolpidem (AMBIEN) 10 MG tablet Take 10 mg by mouth at bedtime as needed for sleep.     No facility-administered medications prior to visit.    PAST MEDICAL HISTORY: Past Medical History:  Diagnosis Date  .  Anxiety   . Arthritis   . Clotting disorder ([place])   . Depression   . DM (diabetes mellitus) ([place])   . GERD (gastroesophageal reflux disease)   . Hiatal hernia   . History of shingles 04/03/2014  . HTN (hypertension)   . Hyperlipidemia   . Insomnia    resolved by using CPAP  . Iron deficiency anemia due to chronic blood loss 11/20/2016  . Macular degeneration   . Nocturia more than twice per night 11/11/2014   For 6 month, 5 nocturias a night.   . Obesity   . 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 Portal vein thrombosis   . Rhinitis   . Situational depression   . Sleep apnea    uses CPAP every night  . Tubular adenoma 12/10/2015   6 cecum polyps    PAST SURGICAL HISTORY: Past Surgical History:  Procedure Laterality Date  . CARDIAC CATHETERIZATION     greater 10 yrs ago, normal  . CARPAL TUNNEL RELEASE     bilateral  . COLONOSCOPY  06/2005   [facility] Dr Lajoyce [facility]  . ESOPHAGOGASTRODUODENOSCOPY (EGD) WITH PROPOFOL N/A 09/28/2016   Procedure: ESOPHAGOGASTRODUODENOSCOPY (EGD) WITH PROPOFOL;  Surgeon: Gatha Mayer, MD;  Location: [facility];  Service: Endoscopy;  Laterality: N/A;  . IR GENERIC HISTORICAL  12/12/2016   IR US GUIDE VASC ACCESS RIGHT 12/12/2016 [name], MD [facility]  . IR GENERIC HISTORICAL  12/12/2016   IR VENOGRAM HEPATIC W HEMODYNAMIC EVALUATION 12/12/2016 [name], MD [facility]  . IR GENERIC HISTORICAL  12/12/2016   IR TRANSCATHETER BX 12/12/2016 [name], MD [facility]  . ROTATOR CUFF REPAIR     right  . TENDON REPAIR     left arm  . TONSILLECTOMY    . TOTAL KNEE ARTHROPLASTY Right 2010  . WISDOM TOOTH EXTRACTION      FAMILY HISTORY: Family History  Problem Relation Age of Onset  . Colon cancer Other   . Heart failure Other   . Heart attack Other   . Lung disease Father        history  . Diabetes Mother        history  . Stroke Mother        history  . Colon polyps Neg Hx   . Rectal cancer Neg Hx   . Stomach cancer  Neg Hx   .  Esophageal cancer Neg Hx     SOCIAL HISTORY: Social History   Socioeconomic History  . Marital status: Married    Spouse name: Not on file  . Number of children: 1  . Years of education: Not on file  . Highest education level: Not on file  Occupational History  . Not on file  Tobacco Use  . Smoking status: Never Smoker  . Smokeless tobacco: Never Used  . Tobacco comment: never used tobacco  Substance and Sexual Activity  . Alcohol use: Yes    Alcohol/week: 0.0 standard drinks  . Drug use: No  . Sexual activity: Not on file  Other Topics Concern  . Not on file  Social History Narrative   Right handed.  Caffeine 3 cups daily,  Married 2 kids (one deceased).  College grad.  [employer].    Social Determinants of Health   Financial Resource Strain:   . Difficulty of Paying Living Expenses:   Food Insecurity:   . Worried About Charity fundraiser in the Last Year:   . Arboriculturist in the Last Year:   Transportation Needs:   . Film/video editor (Medical):   [name] Lack of Transportation (Non-Medical):   Physical Activity:   . Days of Exercise per Week:   . Minutes of Exercise per Session:   Stress:   . Feeling of Stress :   Social Connections:   . Frequency of Communication with Friends and Family:   . Frequency of Social Gatherings with Friends and Family:   . Attends Religious Services:   . Active Member of Clubs or Organizations:   . Attends Archivist Meetings:   [name] Marital Status:   Intimate Partner Violence:   . Fear of Current or Ex-Partner:   . Emotionally Abused:   [name] Physically Abused:   . Sexually Abused:       PHYSICAL EXAM  Vitals:   04/19/20 0841  BP: 134/89  Pulse: 84  Temp: 97.8 F (36.6 C)  Weight: 215 lb (97.5 kg)  Height: 5' 6"  (1.676 m)   Body mass index is 34.7 kg/m.  Generalized: Well developed, in no acute distress  Chest: Lungs clear to auscultation bilaterally  Neurological  examination  Mentation: Alert oriented to time, place, history taking. Follows all commands speech and language fluent Cranial nerve II-XII: Extraocular movements were full, visual field were full on confrontational test Head turning and shoulder shrug  were normal and symmetric. Motor: The motor testing reveals 5 over 5 strength of all 4 extremities. Good symmetric motor tone is noted throughout.  Sensory: Sensory testing is intact to soft touch on all 4 extremities. No evidence of extinction is noted.  Gait and station: Gait is normal.    DIAGNOSTIC DATA (LABS, IMAGING, TESTING) - I reviewed patient records, labs, notes, testing and imaging myself where available.  Lab Results  Component Value Date   WBC 10.2 02/04/2020   HGB 13.2 02/04/2020   HCT 45.2 02/04/2020   MCV 70.5 (L) 02/04/2020   PLT 357 02/04/2020      Component Value Date/Time   NA 137 02/04/2020 1450   NA 137 10/29/2017 1307   NA 134 (L) 05/10/2017 0755   K 3.8 02/04/2020 1450   K 3.6 10/29/2017 1307   K 4.2 05/10/2017 0755   CL 105 02/04/2020 1450   CL 101 10/29/2017 1307   CO2 23 02/04/2020 1450   CO2 25 10/29/2017 1307   CO2  20 (L) 05/10/2017 0755   GLUCOSE 168 (H) 02/04/2020 1450   GLUCOSE 200 (H) 10/29/2017 1307   BUN 19 02/04/2020 1450   BUN 14 10/29/2017 1307   BUN 21.0 05/10/2017 0755   CREATININE 1.27 (H) 02/04/2020 1450   CREATININE 1.4 (H) 10/29/2017 1307   CREATININE 1.3 05/10/2017 0755   CALCIUM 11.1 (H) 02/04/2020 1450   CALCIUM 10.3 10/29/2017 1307   CALCIUM 10.3 05/10/2017 0755   PROT 6.8 02/04/2020 1450   PROT 6.5 10/29/2017 1307   PROT 6.9 05/10/2017 0755   ALBUMIN 4.6 02/04/2020 1450   ALBUMIN 3.6 10/29/2017 1307   ALBUMIN 3.9 05/10/2017 0755   AST 18 02/04/2020 1450   AST 25 05/10/2017 0755   ALT 21 02/04/2020 1450   ALT 27 10/29/2017 1307   ALT 26 05/10/2017 0755   ALKPHOS 83 02/04/2020 1450   ALKPHOS 77 10/29/2017 1307   ALKPHOS 97 05/10/2017 0755   BILITOT 0.6 02/04/2020  1450   BILITOT 0.70 05/10/2017 0755   GFRNONAA 55 (L) 02/04/2020 1450   GFRAA >60 02/04/2020 1450   No results found for: CHOL, HDL, LDLCALC, LDLDIRECT, TRIG, CHOLHDL Lab Results  Component Value Date   HGBA1C 7.5 (H) 04/30/2016   No results found for: VITAMINB12 No results found for: TSH    ASSESSMENT AND PLAN 74 y.o. year old male  has a past medical history of Anxiety, Arthritis, Clotting disorder ([place]), Depression, DM (diabetes mellitus) ([place]), GERD (gastroesophageal reflux disease), Hiatal hernia, History of shingles (04/03/2014), HTN (hypertension), Hyperlipidemia, Insomnia, Iron deficiency anemia due to chronic blood loss (11/20/2016), Macular degeneration, Nocturia more than twice per night (11/11/2014), Obesity, Polycythemia vera(238.4), Portal vein thrombosis, Rhinitis, Situational depression, Sleep apnea, and Tubular adenoma (12/10/2015). here with:  1. OSA on CPAP  - CPAP compliance excellent - Good treatment of AHI  - Encourage patient to use CPAP nightly and > 4 hours each night - F/U in 1 year or sooner if needed   I spent 20 minutes of face-to-face and non-face-to-face time with patient.  This included previsit chart review, lab review, study review, order entry, electronic health record documentation, patient education.  Ward Givens, MSN, NP-C 04/19/2020, 8:46 AM [facility] [address] [place], [place] [address] [phone]

## 2020-04-19 NOTE — Patient Instructions (Signed)
Continue using CPAP nightly and greater than 4 hours each night °If your symptoms worsen or you develop new symptoms please let us know.  ° °

## 2020-05-25 ENCOUNTER — Ambulatory Visit (INDEPENDENT_AMBULATORY_CARE_PROVIDER_SITE_OTHER): Payer: Medicare Other | Admitting: Licensed Clinical Social Worker

## 2020-05-25 ENCOUNTER — Other Ambulatory Visit: Payer: Self-pay

## 2020-05-25 DIAGNOSIS — F411 Generalized anxiety disorder: Secondary | ICD-10-CM | POA: Diagnosis not present

## 2020-05-25 DIAGNOSIS — F331 Major depressive disorder, recurrent, moderate: Secondary | ICD-10-CM | POA: Diagnosis not present

## 2020-05-25 NOTE — Progress Notes (Addendum)
Virtual Visit via Video Note  I connected with James [name]. on 05/25/20 at  9:00 AM EDT by a video enabled telemedicine application and verified that I am speaking with the correct person using two identifiers.  Location: Patient and wife: home Provider: [facility]   I discussed the limitations of evaluation and management by telemedicine and the availability of in person appointments. The patient expressed understanding and agreed to proceed.   I discussed the assessment and treatment plan with the patient. The patient was provided an opportunity to ask questions and all were answered. The patient agreed with the plan and demonstrated an understanding of the instructions.   The patient was advised to call back or seek an in-person evaluation if the symptoms worsen or if the condition fails to improve as anticipated.  I provided 60 minutes of non-face-to-face time during this encounter.   James Amesquita R Roshanna Cimino, LCSW   THERAPIST PROGRESS NOTE  Session Time: 3  Participation Level: Active  Behavioral Response: Well GroomedAlertAnxious  Type of Therapy: Individual Therapy  Treatment Goals addressed: Anger, Anxiety and Communication: relationship communication  Interventions: Supportive and Meditation: mindfulness  Summary: James Giammarco. is a 74 y.o. male who presents with continuing depression, anxiety, and insomnia. Session included wife. Allowed pt and wife to explore and express thoughts and feelings associated with pt's mood/anxiety. Reviewed overall management of anxiety and recommended focus on self-care, mindfulness, and meditation. Will continue with focus on emotion regulation   Suicidal/Homicidal: Nowithout intent/plan  Therapist Response: Pt is showing evidence of fluctuating progress. Will continue working on communication, anxiety management, and emotion regulation  Plan: Return again in 5 weeks.  Diagnosis: Axis I: Generalized Anxiety Disorder and Major  Depression, Recurrent severe    Axis II: No diagnosis    James Bo Rickardo Brinegar, LCSW 05/25/2020

## 2020-05-25 NOTE — Patient Instructions (Signed)
Mindfulness-Based Stress Reduction Mindfulness-based stress reduction (MBSR) is a program that helps people learn to practice mindfulness. Mindfulness is the practice of intentionally paying attention to the present moment. It can be learned and practiced through techniques such as education, breathing exercises, meditation, and yoga. MBSR includes several mindfulness techniques in one program. MBSR works best when you understand the treatment, are willing to try new things, and can commit to spending time practicing what you learn. MBSR training may include learning about:  How your emotions, thoughts, and reactions affect your body.  New ways to respond to things that cause negative thoughts to start (triggers).  How to notice your thoughts and let go of them.  Practicing awareness of everyday things that you normally do without thinking.  The techniques and goals of different types of meditation. What are the benefits of MBSR? MBSR can have many benefits, which include helping you to:  Develop self-awareness. This refers to knowing and understanding yourself.  Learn skills and attitudes that help you to participate in your own health care.  Learn new ways to care for yourself.  Be more accepting about how things are, and let things go.  Be less judgmental and approach things with an open mind.  Be patient with yourself and trust yourself more. MBSR has also been shown to:  Reduce negative emotions, such as depression and anxiety.  Improve memory and focus.  Change how you sense and approach pain.  Boost your body's ability to fight infections.  Help you connect better with other people.  Improve your sense of well-being. Follow these instructions at home:   Find a local in-person or online MBSR program.  Set aside some time regularly for mindfulness practice.  Find a mindfulness practice that works best for you. This may include one or more of the  following: ? Meditation. Meditation involves focusing your mind on a certain thought or activity. ? Breathing awareness exercises. These help you to stay present by focusing on your breath. ? Body scan. For this practice, you lie down and pay attention to each part of your body from head to toe. You can identify tension and soreness and intentionally relax parts of your body. ? Yoga. Yoga involves stretching and breathing, and it can improve your ability to move and be flexible. It can also provide an experience of testing your body's limits, which can help you release stress. ? Mindful eating. This way of eating involves focusing on the taste, texture, color, and smell of each bite of food. Because this slows down eating and helps you feel full sooner, it can be an important part of a weight-loss plan.  Find a podcast or recording that provides guidance for breathing awareness, body scan, or meditation exercises. You can listen to these any time when you have a free moment to rest without distractions.  Follow your treatment plan as told by your health care provider. This may include taking regular medicines and making changes to your diet or lifestyle as recommended. How to practice mindfulness To do a basic awareness exercise:  Find a comfortable place to sit.  Pay attention to the present moment. Observe your thoughts, feelings, and surroundings just as they are.  Avoid placing judgment on yourself, your feelings, or your surroundings. Make note of any judgment that comes up, and let it go.  Your mind may wander, and that is okay. Make note of when your thoughts drift, and return your attention to the present moment. To do  basic mindfulness meditation:  Find a comfortable place to sit. This may include a stable chair or a firm floor cushion. ? Sit upright with your back straight. Let your arms fall next to your side with your hands resting on your legs. ? If sitting in a chair, rest your  feet flat on the floor. ? If sitting on a cushion, cross your legs in front of you.  Keep your head in a neutral position with your chin dropped slightly. Relax your jaw and rest the tip of your tongue on the roof of your mouth. Drop your gaze to the floor. You can close your eyes if you like.  Breathe normally and pay attention to your breath. Feel the air moving in and out of your nose. Feel your belly expanding and relaxing with each breath.  Your mind may wander, and that is okay. Make note of when your thoughts drift, and return your attention to your breath.  Avoid placing judgment on yourself, your feelings, or your surroundings. Make note of any judgment or feelings that come up, let them go, and bring your attention back to your breath.  When you are ready, lift your gaze or open your eyes. Pay attention to how your body feels after the meditation. Where to find more information You can find more information about MBSR from:  Your health care provider.  Community-based meditation centers or programs.  Programs offered near you. Summary  Mindfulness-based stress reduction (MBSR) is a program that teaches you how to intentionally pay attention to the present moment. It is used with other treatments to help you cope better with daily stress, emotions, and pain.  MBSR focuses on developing self-awareness, which allows you to respond to life stress without judgment or negative emotions.  MBSR programs may involve learning different mindfulness practices, such as breathing exercises, meditation, yoga, body scan, or mindful eating. Find a mindfulness practice that works best for you, and set aside time for it on a regular basis. This information is not intended to replace advice given to you by your health care provider. Make sure you discuss any questions you have with your health care provider. Document Revised: 11/16/2017 Document Reviewed: 04/12/2017 Elsevier Patient Education   [place].

## 2020-06-03 ENCOUNTER — Inpatient Hospital Stay: Payer: Medicare Other

## 2020-06-03 ENCOUNTER — Encounter: Payer: Self-pay | Admitting: Hematology & Oncology

## 2020-06-03 ENCOUNTER — Other Ambulatory Visit: Payer: Self-pay

## 2020-06-03 ENCOUNTER — Inpatient Hospital Stay: Payer: Medicare Other | Attending: Hematology & Oncology | Admitting: Hematology & Oncology

## 2020-06-03 VITALS — BP 125/71 | HR 90 | Temp 96.9°F | Resp 19 | Wt 207.0 lb

## 2020-06-03 DIAGNOSIS — D500 Iron deficiency anemia secondary to blood loss (chronic): Secondary | ICD-10-CM

## 2020-06-03 DIAGNOSIS — D45 Polycythemia vera: Secondary | ICD-10-CM

## 2020-06-03 DIAGNOSIS — D72829 Elevated white blood cell count, unspecified: Secondary | ICD-10-CM | POA: Insufficient documentation

## 2020-06-03 DIAGNOSIS — I81 Portal vein thrombosis: Secondary | ICD-10-CM

## 2020-06-03 LAB — CMP (CANCER CENTER ONLY)
ALT: 18 U/L (ref 0–44)
AST: 19 U/L (ref 15–41)
Albumin: 4.5 g/dL (ref 3.5–5.0)
Alkaline Phosphatase: 104 U/L (ref 38–126)
Anion gap: 8 (ref 5–15)
BUN: 19 mg/dL (ref 8–23)
CO2: 26 mmol/L (ref 22–32)
Calcium: 11 mg/dL — ABNORMAL HIGH (ref 8.9–10.3)
Chloride: 102 mmol/L (ref 98–111)
Creatinine: 1.56 mg/dL — ABNORMAL HIGH (ref 0.61–1.24)
GFR, Est AFR Am: 50 mL/min — ABNORMAL LOW (ref >60)
GFR, Estimated: 43 mL/min — ABNORMAL LOW (ref >60)
Glucose, Bld: 269 mg/dL — ABNORMAL HIGH (ref 70–99)
Potassium: 4.1 mmol/L (ref 3.5–5.1)
Sodium: 136 mmol/L (ref 135–145)
Total Bilirubin: 0.7 mg/dL (ref 0.3–1.2)
Total Protein: 6.8 g/dL (ref 6.5–8.1)

## 2020-06-03 LAB — CBC WITH DIFFERENTIAL (CANCER CENTER ONLY)
Abs Immature Granulocytes: 0.24 10*3/uL — ABNORMAL HIGH (ref 0.00–0.07)
Basophils Absolute: 0.6 10*3/uL — ABNORMAL HIGH (ref 0.0–0.1)
Basophils Relative: 3 %
Eosinophils Absolute: 0.6 10*3/uL — ABNORMAL HIGH (ref 0.0–0.5)
Eosinophils Relative: 4 %
HCT: 47.4 % (ref 39.0–52.0)
Hemoglobin: 13.7 g/dL (ref 13.0–17.0)
Immature Granulocytes: 1 %
Lymphocytes Relative: 11 %
Lymphs Abs: 1.8 10*3/uL (ref 0.7–4.0)
MCH: 19.9 pg — ABNORMAL LOW (ref 26.0–34.0)
MCHC: 28.9 g/dL — ABNORMAL LOW (ref 30.0–36.0)
MCV: 68.9 fL — ABNORMAL LOW (ref 80.0–100.0)
Monocytes Absolute: 1.2 10*3/uL — ABNORMAL HIGH (ref 0.1–1.0)
Monocytes Relative: 7 %
Neutro Abs: 12.5 10*3/uL — ABNORMAL HIGH (ref 1.7–7.7)
Neutrophils Relative %: 74 %
Platelet Count: 474 10*3/uL — ABNORMAL HIGH (ref 150–400)
RBC: 6.88 MIL/uL — ABNORMAL HIGH (ref 4.22–5.81)
RDW: 22.8 % — ABNORMAL HIGH (ref 11.5–15.5)
WBC Count: 16.9 10*3/uL — ABNORMAL HIGH (ref 4.0–10.5)
nRBC: 0.3 % — ABNORMAL HIGH (ref 0.0–0.2)

## 2020-06-03 NOTE — Progress Notes (Signed)
Hematology and Oncology Follow Up Visit  James [name] [id number] 1946/05/16 74 y.o. 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 IV iron as indicated - last received in February 2021       Interim History:  James [name] is here today for a follow-up.  He is doing okay.  He is now retired.  He and his family are planning on going down to [place] to go to the beach next week.  He is looking forward to this.  He is losing some weight.  This certainly will help him out.  He has had no problems with headache.  He has had no cough or shortness of breath.  He has had no change in bowel or bladder habits.  He has had no rashes.  When we last saw him in February, his ferritin was 5 and his iron saturation was 6.  We had to give him some IV iron at that time.  His blood sugar is quite high today.  He said he had a big lunch.  He has had no nausea or vomiting.  There is been no bleeding.  He still on the Xarelto.  Overall, his performance status is ECOG 0.   Medications:  Allergies as of 06/03/2020      Reactions   Ibuprofen Other (See Comments), Anaphylaxis   Upper GI Bled   Nsaids       Medication List       Accurate as of June 03, 2020  3:54 PM. If you have any questions, ask your nurse or doctor.        STOP taking these medications   glimepiride 2 MG tablet Commonly known as: AMARYL Stopped by: [name], MD     TAKE these medications   amLODipine 5 MG tablet Commonly known as: NORVASC Take 5 mg by mouth 2 (two) times daily.   benzonatate 100 MG capsule Commonly known as: TESSALON benzonatate 100 mg capsule  TAKE 1 CAP BY MOUTH UP TO 3 TIMES DAILY AS NEEDED FOR COUGH   buPROPion 150 MG 24 hr tablet Commonly known as: WELLBUTRIN XL Take 150 mg by mouth daily. Take 2 tablets total of 300  mg in the morning.   buPROPion 300 MG 24 hr tablet Commonly known as: WELLBUTRIN XL Take 300 mg by mouth daily.   [facility] B-12 500 MCG Subl Generic drug: Cyanocobalamin Place 2 tablets under the tongue daily with breakfast.   desmopressin 0.2 MG tablet Commonly known as: DDAVP Take 0.4 mg by mouth 2 (two) times daily.   fluticasone 50 MCG/ACT nasal spray Commonly known as: FLONASE Place 1 spray into both nostrils 2 (two) times daily.   glucose blood test strip OneTouch Ultra Blue Test Strip  USE TO TEST BLOOD SUGAR ONCE DAILY What changed: Another medication with the same name was removed. Continue taking this medication, and follow the directions you see here. Changed by: [name], MD   hydrochlorothiazide 25 MG tablet Commonly known as: HYDRODIURIL Take 25 mg by mouth every morning.   LORazepam 0.5 MG tablet Commonly known as: ATIVAN Take 0.5 mg by mouth daily.   metFORMIN 500 MG 24 hr tablet Commonly known as: GLUCOPHAGE-XR Take 500 mg by mouth 2 (two) times daily.   moxifloxacin 0.5 % ophthalmic solution Commonly known as: VIGAMOX PLACE 1 DROP  IN RIGHT EYE FOUR TIMES A DAY USE THE DAY BEFORE, DAY OF, AND DAY AFTER TREATMENT.   pantoprazole 40 MG tablet Commonly known as: PROTONIX TAKE 1 TABLET (40 MG TOTAL) BY MOUTH DAILY BEFORE BREAKFAST (MAX DAYS #30 ON INS)   PRESCRIPTION MEDICATION every 8 (eight) weeks. Gets injections in right eye at dr's office every 8 weeks   rosuvastatin 20 MG tablet Commonly known as: CRESTOR Take 20 mg by mouth at bedtime.   testosterone cypionate 200 MG/ML injection Commonly known as: DEPOTESTOSTERONE CYPIONATE Inject 150 mg into the muscle every 21 ( twenty-one) days.   Trulicity 1.5 JE/5.6DJ Sopn Generic drug: Dulaglutide SMARTSIG:0.5 Milliliter(s) SUB-Q Once a Week   Viagra 100 MG tablet Generic drug: sildenafil Take 100 mg by mouth daily as needed for erectile dysfunction.   Xarelto 10 MG Tabs tablet Generic drug:  rivaroxaban TAKE 1 TABLET BY MOUTH EVERY DAY   zolpidem 10 MG tablet Commonly known as: AMBIEN Take 10 mg by mouth at bedtime as needed.       Allergies:  Allergies  Allergen Reactions  . Ibuprofen Other (See Comments) and Anaphylaxis    Upper GI Bled  . Nsaids     Past Medical History, Surgical history, Social history, and Family History were reviewed and updated.  Review of Systems: Review of Systems  Constitutional: Negative.   HENT: Negative.   Eyes: Negative.   Respiratory: Negative.   Cardiovascular: Negative.   Gastrointestinal: Negative.   Genitourinary: Negative.   Musculoskeletal: Negative.   Skin: Negative.   Neurological: Negative.   Endo/Heme/Allergies: Negative.   Psychiatric/Behavioral: Negative.     Physical Exam:  weight is 207 lb (93.9 kg). His temporal temperature is 96.9 F (36.1 C) (abnormal). His blood pressure is 125/71 and his pulse is 90. His respiration is 19 and oxygen saturation is 100%.   Wt Readings from Last 3 Encounters:  06/03/20 207 lb (93.9 kg)  04/19/20 215 lb (97.5 kg)  02/16/20 218 lb 3.2 oz (99 kg)    Physical Exam Vitals reviewed.  HENT:     Head: Normocephalic and atraumatic.  Eyes:     Pupils: Pupils are equal, round, and reactive to light.  Cardiovascular:     Rate and Rhythm: Normal rate and regular rhythm.     Heart sounds: Normal heart sounds.  Pulmonary:     Effort: Pulmonary effort is normal.     Breath sounds: Normal breath sounds.  Abdominal:     General: Bowel sounds are normal.     Palpations: Abdomen is soft.  Musculoskeletal:        General: No tenderness or deformity. Normal range of motion.     Cervical back: Normal range of motion.  Lymphadenopathy:     Cervical: No cervical adenopathy.  Skin:    General: Skin is warm and dry.     Findings: No erythema or rash.  Neurological:     Mental Status: He is alert and oriented to person, place, and time.  Psychiatric:        Behavior: Behavior  normal.        Thought Content: Thought content normal.        Judgment: Judgment normal.      Lab Results  Component Value Date   WBC 16.9 (H) 06/03/2020   HGB 13.7 06/03/2020   HCT 47.4 06/03/2020   MCV 68.9 (L) 06/03/2020   PLT 474 (H) 06/03/2020   Lab Results  Component Value Date   FERRITIN 5 (  L) 02/04/2020   IRON 27 (L) 02/04/2020   TIBC 469 (H) 02/04/2020   UIBC 442 (H) 02/04/2020   IRONPCTSAT 6 (L) 02/04/2020   Lab Results  Component Value Date   RETICCTPCT 1.6 02/04/2020   RBC 6.88 (H) 06/03/2020   RETICCTABS 94.0 08/30/2011   No results found for: [facility] Lab Results  Component Value Date   IGGSERUM 828 10/11/2016   IGA 177 06/22/2016   No results found for: Odetta Pink, SPEI   Chemistry      Component Value Date/Time   NA 136 06/03/2020 1422   NA 137 10/29/2017 1307   NA 134 (L) 05/10/2017 0755   K 4.1 06/03/2020 1422   K 3.6 10/29/2017 1307   K 4.2 05/10/2017 0755   CL 102 06/03/2020 1422   CL 101 10/29/2017 1307   CO2 26 06/03/2020 1422   CO2 25 10/29/2017 1307   CO2 20 (L) 05/10/2017 0755   BUN 19 06/03/2020 1422   BUN 14 10/29/2017 1307   BUN 21.0 05/10/2017 0755   CREATININE 1.56 (H) 06/03/2020 1422   CREATININE 1.4 (H) 10/29/2017 1307   CREATININE 1.3 05/10/2017 0755      Component Value Date/Time   CALCIUM 11.0 (H) 06/03/2020 1422   CALCIUM 10.3 10/29/2017 1307   CALCIUM 10.3 05/10/2017 0755   ALKPHOS 104 06/03/2020 1422   ALKPHOS 77 10/29/2017 1307   ALKPHOS 97 05/10/2017 0755   AST 19 06/03/2020 1422   AST 25 05/10/2017 0755   ALT 18 06/03/2020 1422   ALT 27 10/29/2017 1307   ALT 26 05/10/2017 0755   BILITOT 0.7 06/03/2020 1422   BILITOT 0.70 05/10/2017 0755     Impression and Plan: Mr. Slayton is 74 year old gentleman with polycythemia. He is JAK2 negative.  I noted that his white cell count and platelet count are both up a little bit.  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  He probably does need to have a phlebotomy.  He wants to wait until he gets back from his vacation.  I do not see a problem with this.  We will plan to get him back to see [place] after Labor Day.  We will get him through the rest of summer.    [name], MD 6/17/20213:54 PM

## 2020-06-04 ENCOUNTER — Telehealth: Payer: Self-pay | Admitting: Hematology & Oncology

## 2020-06-04 LAB — IRON AND TIBC
Iron: 35 ug/dL — ABNORMAL LOW (ref 42–163)
Saturation Ratios: 8 % — ABNORMAL LOW (ref 20–55)
TIBC: 435 ug/dL — ABNORMAL HIGH (ref 202–409)
UIBC: 400 ug/dL — ABNORMAL HIGH (ref 117–376)

## 2020-06-04 LAB — FERRITIN: Ferritin: 9 ng/mL — ABNORMAL LOW (ref 24–336)

## 2020-06-04 NOTE — Telephone Encounter (Signed)
Called and spoke with patient regarding appointment that was added per 6/17 sch msg

## 2020-06-04 NOTE — Telephone Encounter (Signed)
Appointments scheduled patient will get updates from My Chart per 6/17 los

## 2020-06-15 ENCOUNTER — Inpatient Hospital Stay: Payer: Medicare Other

## 2020-06-15 ENCOUNTER — Other Ambulatory Visit: Payer: Self-pay

## 2020-06-15 DIAGNOSIS — D45 Polycythemia vera: Secondary | ICD-10-CM | POA: Diagnosis not present

## 2020-06-15 NOTE — Progress Notes (Signed)
James [name] presents today for phlebotomy per MD orders. Phlebotomy procedure started at 1210 and ended at 1220 580 grams removed via 16 gauge needle to right AC.  Patient observed for 30 minutes after procedure without any incident. Patient tolerated procedure well and received water and snacks after procedure.  Patient understands to call if he has any questions or concerns post discharge.

## 2020-06-15 NOTE — Patient Instructions (Signed)

## 2020-06-16 ENCOUNTER — Encounter: Payer: Self-pay | Admitting: Nurse Practitioner

## 2020-06-16 ENCOUNTER — Ambulatory Visit (INDEPENDENT_AMBULATORY_CARE_PROVIDER_SITE_OTHER): Payer: Medicare Other | Admitting: Nurse Practitioner

## 2020-06-16 VITALS — BP 110/78 | HR 80 | Ht 66.0 in | Wt 200.0 lb

## 2020-06-16 DIAGNOSIS — D509 Iron deficiency anemia, unspecified: Secondary | ICD-10-CM | POA: Diagnosis not present

## 2020-06-16 DIAGNOSIS — R194 Change in bowel habit: Secondary | ICD-10-CM

## 2020-06-16 NOTE — Patient Instructions (Addendum)
If you are age 75 or older, your body mass index should be between 23-30. Your Body mass index is 32.28 kg/m. If this is out of the aforementioned range listed, please consider follow up with your Primary Care Provider.  If you are age 39 or younger, your body mass index should be between 19-25. Your Body mass index is 32.28 kg/m. If this is out of the aformentioned range listed, please consider follow up with your Primary Care Provider.   Please purchase the following medications over the counter and take as directed:  START: Benefiber 1 - 2 tablespoons daily.  You will follow up with our office on 07-20-20 at 10:00am.  Thank you for entrusting me with your care and choosing [facility].  Colletta Maryland, NP

## 2020-06-16 NOTE — Progress Notes (Signed)
IMPRESSION and PLAN:     James [name]. is a 73 y.o. male with a PMH signficant for, but not necessarily limited to, diabetes, HTN, sleep apnea, polycythemia vera , portal vein thrombosis and splenic vein thrombosis, iron deficiency anemia on anticoagulation, small bowel AVMs, adenomatous colon polyps , hepatic steatosis.    # Bowel changes --Started several months ago --Unformed, malodorous stool  / fecal leakage.  --Could be having incomplete emptying. Trial of benefiber ( sugar free) may help. Soluble fiber may absorb some of the liquid content of stool. If able to solidify stool then fecal leakage might improve.  --If no improvement consider SIBO testing / empirically treating given risk factor of diabetes. Doubt EPI given description of symptoms --Internal hemorrhoids can sometimes contribute to fecal leakage. If continues to have issues will do DRE at next visit. Consider referral to pelvic floor PT if unable to find / treat cause.  --follow up with me in 3-4 weeks.   # Chronic iron deficiency --Doubt phlebotomies contributing as he gets them infrequently. Got one yesterday but it was January 2020 prior to that.  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 [name], gets IV iron as needed.  --No overt GI bleeding. Patient knows to notify us for dark / black stools.   # Polycythemia with thrombosis.  --Followed by Dr. [name]. On Xarelto --Gets occasional phlebotomies to maintain hct < 45%  # History of colon polyps --5 year recall colonoscopy due Dec 2024  HPI:    Primary GI: [place] Cellar, MD   Chief complaint :  Soft stool  [name] with a complicated past medical history.  [name], last seen 10/24/2019 for ongoing iron deficiency anemia, suspected to be related to ongoing oozing from small bowel AVMs noted on prior capsule.  No other cause of anemia had been found on EGD nor colonoscopy  x2.  He has been followed by Dr. [name]. Got phlebotomies periodically, last one was yesterday  Six months ago stool became more soft stools. Describes "soft serve"  ice cream and very foul smelling. Averaging 1 BM a day in the am though he gets scant amount of fecal leakage during the day and sometimes during the night.  Leakage didn't occur back when stools were more formed. No bloating or excessive gas. Stools are not black, normal color   Cannot related bowel changes to diet. On Glucophage for years. Started Trulicity,  but only 6 weeks ago. Though he does note fecal leakage has gotten a little worse since then. Blood sugars have been high lately.   Data Reviewed:  06/03/2020 WBC 16.9, hemoglobin 13.7, MCV 69 Ferritin 9 TIBC 435, iron saturation 8%   PREVIOUS ENDOSCOPIC EVALUATIONS / GI studies: 12/09/18 colonoscopy -The perianal and digital rectal examinations were normal. - The terminal ileum appeared normal. - A 2 to 3 mm polyp was found in the cecum. The polyp was sessile. The polyp was removed with a cold snare. Resection and retrieval were complete. - A 4 mm polyp was found in the ascending colon. The polyp was sessile. The polyp was removed with a cold snare. Resection and retrieval were complete. - Multiple small-mouthed diverticula were found in the sigmoid colon. - The exam was otherwise without abnormality.  Path = tubular adenoma  Review of systems:     No chest pain, no SOB, no fevers, no urinary sx   Past  Medical History:  Diagnosis Date  . Anxiety   . Arthritis   . Clotting disorder ([place])   . Depression   . DM (diabetes mellitus) ([place])   . GERD (gastroesophageal reflux disease)   . Hiatal hernia   . History of shingles 04/03/2014  . HTN (hypertension)   . Hyperlipidemia   . Insomnia    resolved by using CPAP  . Iron deficiency anemia due to chronic blood loss 11/20/2016  . Macular degeneration   . Nocturia more than twice per night 11/11/2014   For 6 month,  5 nocturias a night.   . Obesity   . 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 Portal vein thrombosis   . Rhinitis   . Situational depression   . Sleep apnea    uses CPAP every night  . Tubular adenoma 12/10/2015   6 cecum polyps    Patient's surgical history, family medical history, social history, medications and allergies were all reviewed in Epic   Serum creatinine: 1.56 mg/dL (H) 06/03/20 1422 Estimated creatinine clearance: 43.2 mL/min (A)  Current Outpatient Medications  Medication Sig Dispense Refill  . amLODipine (NORVASC) 5 MG tablet Take 5 mg by mouth 2 (two) times daily.     . benzonatate (TESSALON) 100 MG capsule benzonatate 100 mg capsule  TAKE 1 CAP BY MOUTH UP TO 3 TIMES DAILY AS NEEDED FOR COUGH    . buPROPion (WELLBUTRIN XL) 150 MG 24 hr tablet Take 150 mg by mouth daily. Take 2 tablets total of 300 mg in the morning.  5  . [facility] B-12 500 MCG SUBL Place 2 tablets under the tongue daily with breakfast.   12  . desmopressin (DDAVP) 0.2 MG tablet Take 0.4 mg by mouth 2 (two) times daily.     . fluticasone (FLONASE) 50 MCG/ACT nasal spray Place 1 spray into both nostrils 2 (two) times daily.    [name] glucose blood test strip OneTouch Ultra Blue Test Strip  USE TO TEST BLOOD SUGAR ONCE DAILY    . hydrochlorothiazide (HYDRODIURIL) 25 MG tablet Take 25 mg by mouth every morning.    [name] LORazepam (ATIVAN) 0.5 MG tablet Take 0.5 mg by mouth daily.   3  . metFORMIN (GLUCOPHAGE-XR) 500 MG 24 hr tablet Take 500 mg by mouth 2 (two) times daily.     [name] moxifloxacin (VIGAMOX) 0.5 % ophthalmic solution PLACE 1 DROP IN RIGHT EYE FOUR TIMES A DAY USE THE DAY BEFORE, DAY OF, AND DAY AFTER TREATMENT.  5  . pantoprazole (PROTONIX) 40 MG tablet TAKE 1 TABLET (40 MG TOTAL) BY MOUTH DAILY BEFORE BREAKFAST (MAX DAYS #30 ON INS) 30 tablet 5  . PRESCRIPTION MEDICATION every 8 (eight) weeks. Gets injections in right eye at dr's office every 8 weeks    . rosuvastatin (CRESTOR) 20 MG tablet Take 20 mg by  mouth at bedtime.     [name] testosterone cypionate (DEPOTESTOSTERONE CYPIONATE) 200 MG/ML injection Inject 150 mg into the muscle every 21 ( twenty-one) days.     . TRULICITY 1.5 LX/7.2IO SOPN SMARTSIG:0.5 Milliliter(s) SUB-Q Once a Week    . VIAGRA 100 MG tablet Take 100 mg by mouth daily as needed for erectile dysfunction.   1  . XARELTO 10 MG TABS tablet TAKE 1 TABLET BY MOUTH EVERY DAY 30 tablet 11  . zolpidem (AMBIEN) 10 MG tablet Take 10 mg by mouth at bedtime as needed.     No current facility-administered medications for this visit.  Filed Weights   06/16/20 0906  Weight: 200 lb (90.7 kg)    Physical Exam:     BP 110/78   Pulse 80   Ht 5' 6"  (1.676 m)   Wt 200 lb (90.7 kg)   BMI 32.28 kg/m   GENERAL:  Pleasant male in NAD PSYCH: : Cooperative, normal affect CARDIAC:  RRR PULM: Normal respiratory effort, lungs CTA bilaterally, no wheezing ABDOMEN:  Nondistended, soft, nontender. No obvious masses, no hepatomegaly,  normal bowel sounds SKIN:  turgor, no lesions seen Musculoskeletal:  Normal muscle tone, normal strength NEURO: Alert and oriented x 3, no focal neurologic deficits  I spent 30 minutes total reviewing records, obtaining history, performing exam, counseling patient and documenting visit / findings.   Tye Savoy , NP 06/16/2020, 9:46 AM

## 2020-06-16 NOTE — Progress Notes (Signed)
Agree with assessment and plan as outlined.  

## 2020-07-01 ENCOUNTER — Ambulatory Visit: Payer: Medicare Other | Admitting: Licensed Clinical Social Worker

## 2020-07-01 ENCOUNTER — Other Ambulatory Visit: Payer: Self-pay

## 2020-07-20 ENCOUNTER — Encounter: Payer: Self-pay | Admitting: Nurse Practitioner

## 2020-07-20 ENCOUNTER — Ambulatory Visit (INDEPENDENT_AMBULATORY_CARE_PROVIDER_SITE_OTHER): Payer: Medicare Other | Admitting: Nurse Practitioner

## 2020-07-20 VITALS — BP 118/70 | HR 75 | Ht 66.0 in | Wt 200.0 lb

## 2020-07-20 DIAGNOSIS — R194 Change in bowel habit: Secondary | ICD-10-CM | POA: Diagnosis not present

## 2020-07-20 MED ORDER — CHOLESTYRAMINE 4 G PO PACK
4.0000 g | PACK | Freq: Every day | ORAL | 2 refills | Status: DC
Start: 2020-07-20 — End: 2020-09-02

## 2020-07-20 NOTE — Progress Notes (Signed)
Agree with assessment and plan as outlined.  

## 2020-07-20 NOTE — Patient Instructions (Addendum)
If you are age 75 or older, your body mass index should be between 23-30. Your Body mass index is 32.28 kg/m. If this is out of the aforementioned range listed, please consider follow up with your Primary Care Provider.  If you are age 59 or younger, your body mass index should be between 19-25. Your Body mass index is 32.28 kg/m. If this is out of the aformentioned range listed, please consider follow up with your Primary Care Provider.   1. Please stop Benefiber 2. We have sent in the following prescription to your pharmacy: Cholestyramine 4g packet use 1 daily.  Call Tye Savoy, NP in around 3 weeks with an update on your bowels.  Due to recent changes in healthcare laws, you may see the results of your imaging and laboratory studies on MyChart before your provider has had a chance to review them.  We understand that in some cases there may be results that are confusing or concerning to you. Not all laboratory results come back in the same time frame and the provider may be waiting for multiple results in order to interpret others.  Please give [place] 48 hours in order for your provider to thoroughly review all the results before contacting the office for clarification of your results.

## 2020-07-20 NOTE — Progress Notes (Signed)
IMPRESSION and PLAN:     74 y.o. male with a PMH signficant for, but not necessarily limited to, diabetes, HTN, sleep apnea, polycythemia vera , portal vein thrombosis and splenic vein thrombosis, iron deficiency anemia on anticoagulation, small bowel AVMs, adenomatous colon polyps , diverticulosis, hepatic steatosis      # Bowel changes --Several month history of softer than normal / malodorous stool with associated fecal leakage.  --Soluber fiber wasn't helpful, will discontinue  --Still need to work on making stool more solid to see if fecal leakage will improve. However, need to be careful not to induce constipation since having only one BM a day. Will try questran 4 grams once daily. He can take mid day, 2 hour apart from any other medications.  --Patient will call in ~ 3 weeks with condition update. If no improvement in stool consistency consider obtaining fecal elastase. Could have mild pancreatic insufficiency in setting of diabetes. Another thought is SIBO in setting of diabetes.  --Hopefully once stool more solid then fecal leakage will improve. If not then consider pelvic floor physical therapy. On DRE today he had fresh leakage of soft soft. On DRE he seemed to have decent resting sphincter tone     HPI:    Primary GI: Demorest Cellar, MD   Chief complaint : follow up on bowel changes.   [name] who I saw the end of June for a several month history of bowel changes consisting of mushy malodorous stool with fecal leakage.  The thought was that he could be having incomplete emptying.  He was given a trial of soluble fiber to help solidify the stool.  There was also the thought that internal hemorrhoids could be contributing to some of his fecal leakage   INTERVAL HISTORY:   Taking Benefiber every day. Still having one mushy BM / day in shape of a banana. Stool is extremely malodorous. He continues to have fecal leakage throughout the day. He  hasn't changed any medications. On Metformin for years. Eating less but hasn't changed diet. No associated bloating / gas.  Prior to onset of bowel changes several months ago he suffered with constipation. No blood in stool.     PREVIOUS ENDOSCOPIC EVALUATIONS / GI studies: 12/09/18 colonoscopy -The perianal and digital rectal examinations were normal. - The terminal ileum appeared normal. - A 2 to 3 mm polyp was found in the cecum. The polyp was sessile. The polyp was removed with a cold snare. Resection and retrieval were complete. - A 4 mm polyp was found in the ascending colon. The polyp was sessile. The polyp was removed with a cold snare. Resection and retrieval were complete. - Multiple small-mouthed diverticula were found in the sigmoid colon. - The exam was otherwise without abnormality.  Path = tubular adenoma   Review of systems:     No chest pain, no SOB, no fevers, no urinary sx   Past Medical History:  Diagnosis Date  . Anxiety   . Arthritis   . Clotting disorder ([place])   . Depression   . DM (diabetes mellitus) ([place])   . GERD (gastroesophageal reflux disease)   . Hiatal hernia   . History of shingles 04/03/2014  . HTN (hypertension)   . Hyperlipidemia   . Insomnia    resolved by using CPAP  . Iron deficiency anemia due to chronic blood loss 11/20/2016  . Macular degeneration   . Nocturia more than twice per night 11/11/2014  For 6 month, 5 nocturias a night.   . Obesity   . 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 Portal vein thrombosis   . Rhinitis   . Situational depression   . Sleep apnea    uses CPAP every night  . Tubular adenoma 12/10/2015   6 cecum polyps    Patient's surgical history, family medical history, social history, medications and allergies were all reviewed in Epic   Creatinine clearance cannot be calculated (Patient's most recent lab result is older than the maximum 21 days allowed.)  Current Outpatient Medications  Medication Sig  Dispense Refill  . amLODipine (NORVASC) 5 MG tablet Take 5 mg by mouth 2 (two) times daily.     . benzonatate (TESSALON) 100 MG capsule benzonatate 100 mg capsule  TAKE 1 CAP BY MOUTH UP TO 3 TIMES DAILY AS NEEDED FOR COUGH    . buPROPion (WELLBUTRIN XL) 150 MG 24 hr tablet Take 150 mg by mouth daily. Take 2 tablets total of 300 mg in the morning.  5  . [facility]S B-12 500 MCG SUBL Place 2 tablets under the tongue daily with breakfast.   12  . desmopressin (DDAVP) 0.2 MG tablet Take 0.4 mg by mouth 2 (two) times daily.     . fluticasone (FLONASE) 50 MCG/ACT nasal spray Place 1 spray into both nostrils 2 (two) times daily.    [name] glucose blood test strip OneTouch Ultra Blue Test Strip  USE TO TEST BLOOD SUGAR ONCE DAILY    . hydrochlorothiazide (HYDRODIURIL) 25 MG tablet Take 25 mg by mouth every morning.    [name] LORazepam (ATIVAN) 0.5 MG tablet Take 0.5 mg by mouth daily.   3  . metFORMIN (GLUCOPHAGE-XR) 500 MG 24 hr tablet Take 500 mg by mouth 2 (two) times daily.     [name] moxifloxacin (VIGAMOX) 0.5 % ophthalmic solution PLACE 1 DROP IN RIGHT EYE FOUR TIMES A DAY USE THE DAY BEFORE, DAY OF, AND DAY AFTER TREATMENT.  5  . pantoprazole (PROTONIX) 40 MG tablet TAKE 1 TABLET (40 MG TOTAL) BY MOUTH DAILY BEFORE BREAKFAST (MAX DAYS #30 ON INS) 30 tablet 5  . PRESCRIPTION MEDICATION every 8 (eight) weeks. Gets injections in right eye at dr's office every 8 weeks    . rosuvastatin (CRESTOR) 20 MG tablet Take 20 mg by mouth at bedtime.     [name] testosterone cypionate (DEPOTESTOSTERONE CYPIONATE) 200 MG/ML injection Inject 150 mg into the muscle every 21 ( twenty-one) days.     . TRULICITY 1.5 GD/9.2EQ SOPN SMARTSIG:0.5 Milliliter(s) SUB-Q Once a Week    . VIAGRA 100 MG tablet Take 100 mg by mouth daily as needed for erectile dysfunction.   1  . Wheat Dextrin (BENEFIBER) POWD Take by mouth. 2 tablespoons daily    . XARELTO 10 MG TABS tablet TAKE 1 TABLET BY MOUTH EVERY DAY 30 tablet 11  . zolpidem (AMBIEN) 10 MG tablet  Take 10 mg by mouth at bedtime as needed.     No current facility-administered medications for this visit.    Filed Weights   07/20/20 0955  Weight: 200 lb (90.7 kg)    Physical Exam:     BP 118/70   Pulse 75   Ht 5' 6"  (1.676 m)   Wt 200 lb (90.7 kg)   BMI 32.28 kg/m   GENERAL:  Pleasant male in NAD PSYCH: : Cooperative, normal affect CARDIAC:  RRR PULM: Normal respiratory effort, lungs CTA bilaterally, no wheezing ABDOMEN:  Nondistended, soft,  nontender. No obvious masses, no hepatomegaly,  normal bowel sounds RECTAL: perianal area smeared with a small amount of light brown,  soft stool. Resting sphincter tone and squeeze pressure seems adequate  SKIN:  turgor, no lesions seen Musculoskeletal:  Normal muscle tone, normal strength NEURO: Alert and oriented x 3, no focal neurologic deficits   Tye Savoy , NP 07/20/2020, 10:19 AM

## 2020-08-12 ENCOUNTER — Other Ambulatory Visit: Payer: Self-pay | Admitting: Hematology & Oncology

## 2020-09-02 ENCOUNTER — Inpatient Hospital Stay (HOSPITAL_BASED_OUTPATIENT_CLINIC_OR_DEPARTMENT_OTHER): Payer: Medicare Other | Admitting: Hematology & Oncology

## 2020-09-02 ENCOUNTER — Telehealth: Payer: Self-pay | Admitting: Hematology & Oncology

## 2020-09-02 ENCOUNTER — Inpatient Hospital Stay: Payer: Medicare Other | Attending: Hematology & Oncology

## 2020-09-02 ENCOUNTER — Inpatient Hospital Stay: Payer: Medicare Other

## 2020-09-02 ENCOUNTER — Other Ambulatory Visit: Payer: Self-pay

## 2020-09-02 ENCOUNTER — Encounter: Payer: Self-pay | Admitting: Hematology & Oncology

## 2020-09-02 VITALS — BP 119/68 | HR 88 | Temp 97.8°F | Resp 19 | Ht 66.0 in | Wt 194.1 lb

## 2020-09-02 DIAGNOSIS — E119 Type 2 diabetes mellitus without complications: Secondary | ICD-10-CM | POA: Diagnosis not present

## 2020-09-02 DIAGNOSIS — R7989 Other specified abnormal findings of blood chemistry: Secondary | ICD-10-CM | POA: Diagnosis not present

## 2020-09-02 DIAGNOSIS — D72829 Elevated white blood cell count, unspecified: Secondary | ICD-10-CM | POA: Diagnosis not present

## 2020-09-02 DIAGNOSIS — D45 Polycythemia vera: Secondary | ICD-10-CM

## 2020-09-02 DIAGNOSIS — Z7901 Long term (current) use of anticoagulants: Secondary | ICD-10-CM | POA: Insufficient documentation

## 2020-09-02 DIAGNOSIS — Z7984 Long term (current) use of oral hypoglycemic drugs: Secondary | ICD-10-CM | POA: Insufficient documentation

## 2020-09-02 DIAGNOSIS — R634 Abnormal weight loss: Secondary | ICD-10-CM | POA: Insufficient documentation

## 2020-09-02 LAB — CBC WITH DIFFERENTIAL (CANCER CENTER ONLY)
Abs Immature Granulocytes: 1.57 10*3/uL — ABNORMAL HIGH (ref 0.00–0.07)
Basophils Absolute: 1.1 10*3/uL — ABNORMAL HIGH (ref 0.0–0.1)
Basophils Relative: 5 %
Eosinophils Absolute: 0.1 10*3/uL (ref 0.0–0.5)
Eosinophils Relative: 1 %
HCT: 45 % (ref 39.0–52.0)
Hemoglobin: 13.5 g/dL (ref 13.0–17.0)
Immature Granulocytes: 8 %
Lymphocytes Relative: 8 %
Lymphs Abs: 1.6 10*3/uL (ref 0.7–4.0)
MCH: 21.6 pg — ABNORMAL LOW (ref 26.0–34.0)
MCHC: 30 g/dL (ref 30.0–36.0)
MCV: 72.1 fL — ABNORMAL LOW (ref 80.0–100.0)
Monocytes Absolute: 1.5 10*3/uL — ABNORMAL HIGH (ref 0.1–1.0)
Monocytes Relative: 7 %
Neutro Abs: 14.2 10*3/uL — ABNORMAL HIGH (ref 1.7–7.7)
Neutrophils Relative %: 71 %
Platelet Count: 516 10*3/uL — ABNORMAL HIGH (ref 150–400)
RBC: 6.24 MIL/uL — ABNORMAL HIGH (ref 4.22–5.81)
RDW: 22.5 % — ABNORMAL HIGH (ref 11.5–15.5)
WBC Count: 20 10*3/uL — ABNORMAL HIGH (ref 4.0–10.5)
nRBC: 0.7 % — ABNORMAL HIGH (ref 0.0–0.2)

## 2020-09-02 LAB — CMP (CANCER CENTER ONLY)
ALT: 18 U/L (ref 0–44)
AST: 21 U/L (ref 15–41)
Albumin: 4.3 g/dL (ref 3.5–5.0)
Alkaline Phosphatase: 93 U/L (ref 38–126)
Anion gap: 8 (ref 5–15)
BUN: 21 mg/dL (ref 8–23)
CO2: 24 mmol/L (ref 22–32)
Calcium: 11 mg/dL — ABNORMAL HIGH (ref 8.9–10.3)
Chloride: 105 mmol/L (ref 98–111)
Creatinine: 1.47 mg/dL — ABNORMAL HIGH (ref 0.61–1.24)
GFR, Est AFR Am: 53 mL/min — ABNORMAL LOW (ref >60)
GFR, Estimated: 46 mL/min — ABNORMAL LOW (ref >60)
Glucose, Bld: 224 mg/dL — ABNORMAL HIGH (ref 70–99)
Potassium: 4.4 mmol/L (ref 3.5–5.1)
Sodium: 137 mmol/L (ref 135–145)
Total Bilirubin: 0.5 mg/dL (ref 0.3–1.2)
Total Protein: 6.8 g/dL (ref 6.5–8.1)

## 2020-09-02 LAB — RETICULOCYTES
Immature Retic Fract: 32.9 % — ABNORMAL HIGH (ref 2.3–15.9)
RBC.: 6.17 MIL/uL — ABNORMAL HIGH (ref 4.22–5.81)
Retic Count, Absolute: 119.1 10*3/uL (ref 19.0–186.0)
Retic Ct Pct: 1.9 % (ref 0.4–3.1)

## 2020-09-02 LAB — IRON AND TIBC
Iron: 27 ug/dL — ABNORMAL LOW (ref 42–163)
Saturation Ratios: 6 % — ABNORMAL LOW (ref 20–55)
TIBC: 435 ug/dL — ABNORMAL HIGH (ref 202–409)
UIBC: 408 ug/dL — ABNORMAL HIGH (ref 117–376)

## 2020-09-02 LAB — FERRITIN: Ferritin: 12 ng/mL — ABNORMAL LOW (ref 24–336)

## 2020-09-02 NOTE — Progress Notes (Signed)
Hematology and Oncology Follow Up Visit  James [name] [id number] 07/26/1946 74 y.o. 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 IV iron as indicated - last received in February 2021       Interim History:  Mr. Tunney is here today for a follow-up.  I am little bit bothered by the fact that his transfer platelets and white cells are going up.  I have a sense that this is secondary to the underlying bone marrow disorder.  I will not intervene as of yet.  I think that if we find that his blood counts continue to rise, we will have to be invasive.  I think he probably will need a bone marrow biopsy.  I also will probably get him on Hydrea.  Otherwise, he is doing well.  Is lost quite a bit of weight.  Is exercising.  He is now on Trulicity for the diabetes.  He is exercising more.  I am happy about the weight loss.  He continues on the Xarelto.  So far, there is no problems with Xarelto.    Overall, his performance status is ECOG 0.   Medications:  Allergies as of 09/02/2020      Reactions   Ibuprofen Anaphylaxis, Other (See Comments)   Upper GI Bled   Nsaids Other (See Comments)      Medication List       Accurate as of September 02, 2020  9:53 AM. If you have any questions, ask your nurse or doctor.        STOP taking these medications   cholestyramine 4 g packet Commonly known as: Questran Stopped by: [name], MD   metFORMIN 500 MG 24 hr tablet Commonly known as: GLUCOPHAGE-XR Stopped by: [name], MD     TAKE these medications   amLODipine 5 MG tablet Commonly known as: NORVASC Take 5 mg by mouth 2 (two) times daily.   Benefiber Powd Take by mouth. 2 tablespoons daily   benzonatate 100 MG capsule Commonly known as: TESSALON benzonatate 100 mg capsule  TAKE 1 CAP BY MOUTH UP TO  3 TIMES DAILY AS NEEDED FOR COUGH   buPROPion 300 MG 24 hr tablet Commonly known as: WELLBUTRIN XL Take 300 mg by mouth every morning. What changed: Another medication with the same name was removed. Continue taking this medication, and follow the directions you see here. Changed by: [name], MD   CVS B-12 500 MCG Subl Generic drug: Cyanocobalamin Place 2 tablets under the tongue daily with breakfast.   desmopressin 0.2 MG tablet Commonly known as: DDAVP Take 0.4 mg by mouth 2 (two) times daily.   fluticasone 50 MCG/ACT nasal spray Commonly known as: FLONASE Place 1 spray into both nostrils 2 (two) times daily.   glucose blood test strip OneTouch Ultra Blue Test Strip  USE TO TEST BLOOD SUGAR ONCE DAILY   hydrochlorothiazide 25 MG tablet Commonly known as: HYDRODIURIL Take 25 mg by mouth every morning.   LORazepam 0.5 MG tablet Commonly known as: ATIVAN Take 0.5 mg by mouth daily.   moxifloxacin 0.5 % ophthalmic solution Commonly known as: VIGAMOX PLACE 1 DROP IN RIGHT EYE FOUR TIMES A DAY USE THE DAY BEFORE, DAY OF, AND DAY AFTER TREATMENT.   pantoprazole 40 MG tablet Commonly known as: PROTONIX TAKE 1 TABLET (40 MG  TOTAL) BY MOUTH DAILY BEFORE BREAKFAST (MAX DAYS #30 ON INS)   PRESCRIPTION MEDICATION every 8 (eight) weeks. Gets injections in right eye at dr's office every 8 weeks   rosuvastatin 20 MG tablet Commonly known as: CRESTOR Take 20 mg by mouth at bedtime.   Synjardy XR 25-1000 MG Tb24 Generic drug: Empagliflozin-metFORMIN HCl ER Take 1 tablet by mouth every morning.   testosterone cypionate 200 MG/ML injection Commonly known as: DEPOTESTOSTERONE CYPIONATE Inject 150 mg into the muscle every 21 ( twenty-one) days.   Trulicity 1.5 ZC/5.8IF Sopn Generic drug: Dulaglutide SMARTSIG:0.5 Milliliter(s) SUB-Q Once a Week   Viagra 100 MG tablet Generic drug: sildenafil Take 100 mg by mouth daily as needed for erectile dysfunction.   Xarelto 10  MG Tabs tablet Generic drug: rivaroxaban TAKE 1 TABLET BY MOUTH EVERY DAY   zolpidem 10 MG tablet Commonly known as: AMBIEN Take 10 mg by mouth at bedtime as needed.       Allergies:  Allergies  Allergen Reactions  . Ibuprofen Anaphylaxis and Other (See Comments)    Upper GI Bled  . Nsaids Other (See Comments)    Past Medical History, Surgical history, Social history, and Family History were reviewed and updated.  Review of Systems: Review of Systems  Constitutional: Negative.   HENT: Negative.   Eyes: Negative.   Respiratory: Negative.   Cardiovascular: Negative.   Gastrointestinal: Negative.   Genitourinary: Negative.   Musculoskeletal: Negative.   Skin: Negative.   Neurological: Negative.   Endo/Heme/Allergies: Negative.   Psychiatric/Behavioral: Negative.     Physical Exam:  height is 5' 6"  (1.676 m) and weight is 194 lb 1.3 oz (88 kg). His oral temperature is 97.8 F (36.6 C). His blood pressure is 119/68 and his pulse is 88. His respiration is 19 and oxygen saturation is 96%.   Wt Readings from Last 3 Encounters:  09/02/20 194 lb 1.3 oz (88 kg)  07/20/20 200 lb (90.7 kg)  06/16/20 200 lb (90.7 kg)    Physical Exam Vitals reviewed.  HENT:     Head: Normocephalic and atraumatic.  Eyes:     Pupils: Pupils are equal, round, and reactive to light.  Cardiovascular:     Rate and Rhythm: Normal rate and regular rhythm.     Heart sounds: Normal heart sounds.  Pulmonary:     Effort: Pulmonary effort is normal.     Breath sounds: Normal breath sounds.  Abdominal:     General: Bowel sounds are normal.     Palpations: Abdomen is soft.  Musculoskeletal:        General: No tenderness or deformity. Normal range of motion.     Cervical back: Normal range of motion.  Lymphadenopathy:     Cervical: No cervical adenopathy.  Skin:    General: Skin is warm and dry.     Findings: No erythema or rash.  Neurological:     Mental Status: He is alert and oriented to  person, place, and time.  Psychiatric:        Behavior: Behavior normal.        Thought Content: Thought content normal.        Judgment: Judgment normal.      Lab Results  Component Value Date   WBC 20.0 (H) 09/02/2020   HGB 13.5 09/02/2020   HCT 45.0 09/02/2020   MCV 72.1 (L) 09/02/2020   PLT 516 (H) 09/02/2020   Lab Results  Component Value Date   FERRITIN 9 (L) 06/03/2020  IRON 35 (L) 06/03/2020   TIBC 435 (H) 06/03/2020   UIBC 400 (H) 06/03/2020   IRONPCTSAT 8 (L) 06/03/2020   Lab Results  Component Value Date   RETICCTPCT 1.9 09/02/2020   RBC 6.17 (H) 09/02/2020   RBC 6.24 (H) 09/02/2020   RETICCTABS 94.0 08/30/2011   No results found for: Nils Pyle [facility] Lab Results  Component Value Date   IGGSERUM 828 10/11/2016   IGA 177 06/22/2016   No results found for: Odetta Pink, SPEI   Chemistry      Component Value Date/Time   NA 137 09/02/2020 0900   NA 137 10/29/2017 1307   NA 134 (L) 05/10/2017 0755   K 4.4 09/02/2020 0900   K 3.6 10/29/2017 1307   K 4.2 05/10/2017 0755   CL 105 09/02/2020 0900   CL 101 10/29/2017 1307   CO2 24 09/02/2020 0900   CO2 25 10/29/2017 1307   CO2 20 (L) 05/10/2017 0755   BUN 21 09/02/2020 0900   BUN 14 10/29/2017 1307   BUN 21.0 05/10/2017 0755   CREATININE 1.47 (H) 09/02/2020 0900   CREATININE 1.4 (H) 10/29/2017 1307   CREATININE 1.3 05/10/2017 0755      Component Value Date/Time   CALCIUM 11.0 (H) 09/02/2020 0900   CALCIUM 10.3 10/29/2017 1307   CALCIUM 10.3 05/10/2017 0755   ALKPHOS 93 09/02/2020 0900   ALKPHOS 77 10/29/2017 1307   ALKPHOS 97 05/10/2017 0755   AST 21 09/02/2020 0900   AST 25 05/10/2017 0755   ALT 18 09/02/2020 0900   ALT 27 10/29/2017 1307   ALT 26 05/10/2017 0755   BILITOT 0.5 09/02/2020 0900   BILITOT 0.70 05/10/2017 0755     Impression and Plan: Mr. Gulley is 74 year old gentleman with polycythemia. He is JAK2  negative.  I really want to get him back in 6 weeks.  At that point in time, we will likely make a decision regarding any further intervention for the polycythemia.  He does not need to be phlebotomized today.  His hematocrit is 45%.  He is on Xarelto.  I think we can hold off on phlebotomizing him.  This is quite complicated.     [name], MD 9/16/20219:53 AM

## 2020-09-02 NOTE — Telephone Encounter (Signed)
Appointments scheduled calendar printed per 9/16 los

## 2020-09-06 ENCOUNTER — Ambulatory Visit
Admission: RE | Admit: 2020-09-06 | Discharge: 2020-09-06 | Disposition: A | Discharge status: Home / Self Care | Payer: Medicare Other | Source: Ambulatory Visit | Attending: Physician Assistant | Admitting: Physician Assistant

## 2020-09-06 ENCOUNTER — Other Ambulatory Visit: Payer: Self-pay | Admitting: Physician Assistant

## 2020-09-06 DIAGNOSIS — S0990XA Unspecified injury of head, initial encounter: Secondary | ICD-10-CM

## 2020-10-05 ENCOUNTER — Other Ambulatory Visit: Payer: Self-pay | Admitting: Orthopedic Surgery

## 2020-10-05 DIAGNOSIS — G8929 Other chronic pain: Secondary | ICD-10-CM

## 2020-10-05 DIAGNOSIS — M545 Low back pain, unspecified: Secondary | ICD-10-CM

## 2020-10-08 ENCOUNTER — Ambulatory Visit
Admission: RE | Admit: 2020-10-08 | Discharge: 2020-10-08 | Disposition: A | Discharge status: Home / Self Care | Payer: Medicare Other | Source: Ambulatory Visit | Attending: Orthopedic Surgery | Admitting: Orthopedic Surgery

## 2020-10-08 DIAGNOSIS — M545 Low back pain, unspecified: Secondary | ICD-10-CM

## 2020-10-08 DIAGNOSIS — G8929 Other chronic pain: Secondary | ICD-10-CM

## 2020-10-08 MED ORDER — METHYLPREDNISOLONE ACETATE 40 MG/ML INJ SUSP (RADIOLOG
120.0000 mg | Freq: Once | INTRAMUSCULAR | Status: AC
  Administered 2020-10-08: 120 mg via EPIDURAL

## 2020-10-08 MED ORDER — IOPAMIDOL (ISOVUE-M 200) INJECTION 41%
1.0000 mL | Freq: Once | INTRAMUSCULAR | Status: AC
  Administered 2020-10-08: 1 mL via EPIDURAL

## 2020-10-08 NOTE — Discharge Instructions (Signed)

## 2020-10-15 ENCOUNTER — Other Ambulatory Visit: Payer: Self-pay

## 2020-10-15 ENCOUNTER — Telehealth: Payer: Self-pay | Admitting: Hematology & Oncology

## 2020-10-15 ENCOUNTER — Encounter: Payer: Self-pay | Admitting: Hematology & Oncology

## 2020-10-15 ENCOUNTER — Inpatient Hospital Stay: Payer: Medicare Other | Attending: Hematology & Oncology

## 2020-10-15 ENCOUNTER — Inpatient Hospital Stay (HOSPITAL_BASED_OUTPATIENT_CLINIC_OR_DEPARTMENT_OTHER): Payer: Medicare Other | Admitting: Hematology & Oncology

## 2020-10-15 ENCOUNTER — Inpatient Hospital Stay: Payer: Medicare Other

## 2020-10-15 VITALS — BP 139/67 | HR 89 | Temp 98.2°F | Resp 20 | Wt 181.8 lb

## 2020-10-15 DIAGNOSIS — D45 Polycythemia vera: Secondary | ICD-10-CM | POA: Diagnosis present

## 2020-10-15 DIAGNOSIS — Z794 Long term (current) use of insulin: Secondary | ICD-10-CM | POA: Insufficient documentation

## 2020-10-15 DIAGNOSIS — E119 Type 2 diabetes mellitus without complications: Secondary | ICD-10-CM | POA: Diagnosis not present

## 2020-10-15 LAB — CMP (CANCER CENTER ONLY)
ALT: 10 U/L (ref 0–44)
AST: 14 U/L — ABNORMAL LOW (ref 15–41)
Albumin: 4.1 g/dL (ref 3.5–5.0)
Alkaline Phosphatase: 100 U/L (ref 38–126)
Anion gap: 9 (ref 5–15)
BUN: 19 mg/dL (ref 8–23)
CO2: 22 mmol/L (ref 22–32)
Calcium: 10.6 mg/dL — ABNORMAL HIGH (ref 8.9–10.3)
Chloride: 104 mmol/L (ref 98–111)
Creatinine: 1.08 mg/dL (ref 0.61–1.24)
GFR, Estimated: >60 mL/min (ref >60)
Glucose, Bld: 144 mg/dL — ABNORMAL HIGH (ref 70–99)
Potassium: 4 mmol/L (ref 3.5–5.1)
Sodium: 135 mmol/L (ref 135–145)
Total Bilirubin: 0.6 mg/dL (ref 0.3–1.2)
Total Protein: 6.7 g/dL (ref 6.5–8.1)

## 2020-10-15 LAB — IRON AND TIBC
Iron: 35 ug/dL — ABNORMAL LOW (ref 42–163)
Saturation Ratios: 8 % — ABNORMAL LOW (ref 20–55)
TIBC: 419 ug/dL — ABNORMAL HIGH (ref 202–409)
UIBC: 384 ug/dL — ABNORMAL HIGH (ref 117–376)

## 2020-10-15 LAB — CBC WITH DIFFERENTIAL (CANCER CENTER ONLY)
Abs Immature Granulocytes: 3.25 10*3/uL — ABNORMAL HIGH (ref 0.00–0.07)
Basophils Absolute: 0.8 10*3/uL — ABNORMAL HIGH (ref 0.0–0.1)
Basophils Relative: 3 %
Eosinophils Absolute: 0.1 10*3/uL (ref 0.0–0.5)
Eosinophils Relative: 0 %
HCT: 45.3 % (ref 39.0–52.0)
Hemoglobin: 13.7 g/dL (ref 13.0–17.0)
Immature Granulocytes: 11 %
Lymphocytes Relative: 6 %
Lymphs Abs: 1.8 10*3/uL (ref 0.7–4.0)
MCH: 21.7 pg — ABNORMAL LOW (ref 26.0–34.0)
MCHC: 30.2 g/dL (ref 30.0–36.0)
MCV: 71.8 fL — ABNORMAL LOW (ref 80.0–100.0)
Monocytes Absolute: 1.9 10*3/uL — ABNORMAL HIGH (ref 0.1–1.0)
Monocytes Relative: 6 %
Neutro Abs: 22.7 10*3/uL — ABNORMAL HIGH (ref 1.7–7.7)
Neutrophils Relative %: 74 %
Platelet Count: 643 10*3/uL — ABNORMAL HIGH (ref 150–400)
RBC: 6.31 MIL/uL — ABNORMAL HIGH (ref 4.22–5.81)
RDW: 21.7 % — ABNORMAL HIGH (ref 11.5–15.5)
WBC Count: 30.6 10*3/uL — ABNORMAL HIGH (ref 4.0–10.5)
nRBC: 0.5 % — ABNORMAL HIGH (ref 0.0–0.2)

## 2020-10-15 LAB — SAVE SMEAR(SSMR), FOR PROVIDER SLIDE REVIEW

## 2020-10-15 LAB — LACTATE DEHYDROGENASE: LDH: 260 U/L — ABNORMAL HIGH (ref 98–192)

## 2020-10-15 LAB — FERRITIN: Ferritin: 19 ng/mL — ABNORMAL LOW (ref 24–336)

## 2020-10-15 NOTE — Progress Notes (Signed)
Hematology and Oncology Follow Up Visit  Dillan Candela [id number] 1946/05/30 74 y.o. 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 IV iron as indicated - last received in February 2021       Interim History:  Mr. Swiderski is here today for a follow-up. Unfortunately, he fell and fractured his back. This was about 4 or 5 weeks ago. He has basically been in bed a lot. He is seeing orthopedic surgery. I think it had epidural injections recently. I don't know if he actually had vertebroplasty. He really cannot tell me.  He has a rolling walker that he is getting around with.  His white cell count and platelet count are going up. This might be reactionary to the fracture. However, I do worry about the possibility of him having a an underlying myeloproliferative process. When we see him back I will send off a BCR/ABL analysis.  He has had no fever. He has had no bleeding. There is been no change in bowel or bladder habits. He has had no rashes. Para he has lost quite a bit of weight. He says he was in the gym working out before he fell.  He does have the diabetes. His blood sugars are on the higher side.  Overall, I would say his performance status is ECOG 2.   Medications:  Allergies as of 10/15/2020      Reactions   Ibuprofen Anaphylaxis, Other (See Comments)   Upper GI Bled   Nsaids Other (See Comments)      Medication List       Accurate as of October 15, 2020  8:13 AM. If you have any questions, ask your nurse or doctor.        amLODipine 5 MG tablet Commonly known as: NORVASC Take 5 mg by mouth 2 (two) times daily.   Benefiber Powd Take by mouth. 2 tablespoons daily   benzonatate 100 MG capsule Commonly known as: TESSALON benzonatate 100 mg capsule  TAKE 1 CAP BY MOUTH UP TO 3 TIMES DAILY AS NEEDED FOR  COUGH   buPROPion 300 MG 24 hr tablet Commonly known as: WELLBUTRIN XL Take 300 mg by mouth every morning.   CVS B-12 500 MCG Subl Generic drug: Cyanocobalamin Place 2 tablets under the tongue daily with breakfast.   desmopressin 0.2 MG tablet Commonly known as: DDAVP Take 0.4 mg by mouth 2 (two) times daily.   fluticasone 50 MCG/ACT nasal spray Commonly known as: FLONASE Place 1 spray into both nostrils 2 (two) times daily.   glucose blood test strip OneTouch Ultra Blue Test Strip  USE TO TEST BLOOD SUGAR ONCE DAILY   hydrochlorothiazide 25 MG tablet Commonly known as: HYDRODIURIL Take 25 mg by mouth every morning.   LORazepam 0.5 MG tablet Commonly known as: ATIVAN Take 0.5 mg by mouth daily.   moxifloxacin 0.5 % ophthalmic solution Commonly known as: VIGAMOX PLACE 1 DROP IN RIGHT EYE FOUR TIMES A DAY USE THE DAY BEFORE, DAY OF, AND DAY AFTER TREATMENT.   pantoprazole 40 MG tablet Commonly known as: PROTONIX TAKE 1 TABLET (40 MG TOTAL) BY MOUTH DAILY BEFORE BREAKFAST (MAX DAYS #30 ON INS)   PRESCRIPTION MEDICATION every 8 (eight) weeks. Gets injections in right eye at dr's office every 8 weeks   rosuvastatin 20 MG tablet Commonly known as: CRESTOR Take 20 mg  by mouth at bedtime.   Synjardy XR 25-1000 MG Tb24 Generic drug: Empagliflozin-metFORMIN HCl ER Take 1 tablet by mouth every morning.   testosterone cypionate 200 MG/ML injection Commonly known as: DEPOTESTOSTERONE CYPIONATE Inject 150 mg into the muscle every 21 ( twenty-one) days.   Trulicity 1.5 HA/1.9FX Sopn Generic drug: Dulaglutide SMARTSIG:0.5 Milliliter(s) SUB-Q Once a Week   Viagra 100 MG tablet Generic drug: sildenafil Take 100 mg by mouth daily as needed for erectile dysfunction.   Xarelto 10 MG Tabs tablet Generic drug: rivaroxaban TAKE 1 TABLET BY MOUTH EVERY DAY   zolpidem 10 MG tablet Commonly known as: AMBIEN Take 10 mg by mouth at bedtime as needed.       Allergies:   Allergies  Allergen Reactions  . Ibuprofen Anaphylaxis and Other (See Comments)    Upper GI Bled  . Nsaids Other (See Comments)    Past Medical History, Surgical history, Social history, and Family History were reviewed and updated.  Review of Systems: Review of Systems  Constitutional: Negative.   HENT: Negative.   Eyes: Negative.   Respiratory: Negative.   Cardiovascular: Negative.   Gastrointestinal: Negative.   Genitourinary: Negative.   Musculoskeletal: Negative.   Skin: Negative.   Neurological: Negative.   Endo/Heme/Allergies: Negative.   Psychiatric/Behavioral: Negative.     Physical Exam:  weight is 181 lb 12.8 oz (82.5 kg). His oral temperature is 98.2 F (36.8 C). His blood pressure is 139/67 and his pulse is 89. His respiration is 20 and oxygen saturation is 96%.   Wt Readings from Last 3 Encounters:  10/15/20 181 lb 12.8 oz (82.5 kg)  09/02/20 194 lb 1.3 oz (88 kg)  07/20/20 200 lb (90.7 kg)    Physical Exam Vitals reviewed.  HENT:     Head: Normocephalic and atraumatic.  Eyes:     Pupils: Pupils are equal, round, and reactive to light.  Cardiovascular:     Rate and Rhythm: Normal rate and regular rhythm.     Heart sounds: Normal heart sounds.  Pulmonary:     Effort: Pulmonary effort is normal.     Breath sounds: Normal breath sounds.  Abdominal:     General: Bowel sounds are normal.     Palpations: Abdomen is soft.  Musculoskeletal:        General: No tenderness or deformity. Normal range of motion.     Cervical back: Normal range of motion.  Lymphadenopathy:     Cervical: No cervical adenopathy.  Skin:    General: Skin is warm and dry.     Findings: No erythema or rash.  Neurological:     Mental Status: He is alert and oriented to person, place, and time.  Psychiatric:        Behavior: Behavior normal.        Thought Content: Thought content normal.        Judgment: Judgment normal.      Lab Results  Component Value Date   WBC  30.6 (H) 10/15/2020   HGB 13.7 10/15/2020   HCT 45.3 10/15/2020   MCV 71.8 (L) 10/15/2020   PLT 643 (H) 10/15/2020   Lab Results  Component Value Date   FERRITIN 12 (L) 09/02/2020   IRON 27 (L) 09/02/2020   TIBC 435 (H) 09/02/2020   UIBC 408 (H) 09/02/2020   IRONPCTSAT 6 (L) 09/02/2020   Lab Results  Component Value Date   RETICCTPCT 1.9 09/02/2020   RBC 6.31 (H) 10/15/2020   RETICCTABS 94.0 08/30/2011  No results found for: Nils Pyle [facility] Lab Results  Component Value Date   IGGSERUM 828 10/11/2016   IGA 177 06/22/2016   No results found for: Odetta Pink, SPEI   Chemistry      Component Value Date/Time   NA 137 09/02/2020 0900   NA 137 10/29/2017 1307   NA 134 (L) 05/10/2017 0755   K 4.4 09/02/2020 0900   K 3.6 10/29/2017 1307   K 4.2 05/10/2017 0755   CL 105 09/02/2020 0900   CL 101 10/29/2017 1307   CO2 24 09/02/2020 0900   CO2 25 10/29/2017 1307   CO2 20 (L) 05/10/2017 0755   BUN 21 09/02/2020 0900   BUN 14 10/29/2017 1307   BUN 21.0 05/10/2017 0755   CREATININE 1.47 (H) 09/02/2020 0900   CREATININE 1.4 (H) 10/29/2017 1307   CREATININE 1.3 05/10/2017 0755      Component Value Date/Time   CALCIUM 11.0 (H) 09/02/2020 0900   CALCIUM 10.3 10/29/2017 1307   CALCIUM 10.3 05/10/2017 0755   ALKPHOS 93 09/02/2020 0900   ALKPHOS 77 10/29/2017 1307   ALKPHOS 97 05/10/2017 0755   AST 21 09/02/2020 0900   AST 25 05/10/2017 0755   ALT 18 09/02/2020 0900   ALT 27 10/29/2017 1307   ALT 26 05/10/2017 0755   BILITOT 0.5 09/02/2020 0900   BILITOT 0.70 05/10/2017 0755     Impression and Plan: Mr. Samad is 74 year old gentleman with polycythemia. He is JAK2 negative.  I still worry about the possibility of a myeloproliferative process. I'm going to get her back in about 5 weeks. We'll send off the BCR/ABL analysis.  I just feel bad that he fractured his back.  I will hold off on and  phlebotomize him right now. I do not want to put extra stress on his body.     [name], MD 10/29/20218:13 AM

## 2020-10-15 NOTE — Telephone Encounter (Signed)
Appointments scheduled calendar printed per 10/29 los

## 2020-10-21 ENCOUNTER — Other Ambulatory Visit: Payer: Self-pay | Admitting: Internal Medicine

## 2020-10-21 DIAGNOSIS — N281 Cyst of kidney, acquired: Secondary | ICD-10-CM

## 2020-10-22 ENCOUNTER — Ambulatory Visit
Admission: RE | Admit: 2020-10-22 | Discharge: 2020-10-22 | Disposition: A | Discharge status: Home / Self Care | Payer: Medicare Other | Source: Ambulatory Visit | Attending: Internal Medicine | Admitting: Internal Medicine

## 2020-10-22 DIAGNOSIS — N281 Cyst of kidney, acquired: Secondary | ICD-10-CM

## 2020-11-04 ENCOUNTER — Other Ambulatory Visit: Payer: Medicare Other

## 2020-11-24 ENCOUNTER — Other Ambulatory Visit: Payer: Self-pay

## 2020-11-24 ENCOUNTER — Ambulatory Visit (INDEPENDENT_AMBULATORY_CARE_PROVIDER_SITE_OTHER): Payer: Medicare Other | Admitting: Family Medicine

## 2020-11-24 ENCOUNTER — Encounter: Payer: Self-pay | Admitting: Family Medicine

## 2020-11-24 DIAGNOSIS — M25559 Pain in unspecified hip: Secondary | ICD-10-CM | POA: Insufficient documentation

## 2020-11-24 NOTE — Assessment & Plan Note (Signed)
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  He is most tender over the greater trochanteric bursa but also has extreme left hip abductor weakness along with a Trendelenburg gait. He also has signs consistent with tight IT band. - We will treat conservatively with formal physical therapy and he can continue doing water aerobics which has been beneficial for him in the last few weeks.  We will follow with him in 4-6 weeks to ensure he is continued to improve however if the pain gets worse I did let him know he could come in and get an injection into the greater trochanteric bursa.

## 2020-11-24 NOTE — Patient Instructions (Addendum)
It was great to meet you today! Thank you for letting me participate in your care!  Today, we discussed your right hip pain which is due to greater trochanteric pain syndrome. I have referred you to Physical Therapy with Barbaraann Barthel to work on increase your hip abductor strength.   Please follow up with me in 4-6 weeks to ensure you are getting better. If you are not getting better we can do a steroid shot in the area of pain to help.   Be well, Harolyn Rutherford, DO PGY-4, Sports Medicine Fellow [place]

## 2020-11-24 NOTE — Progress Notes (Signed)
    SUBJECTIVE:   CHIEF COMPLAINT / HPI:   Left hip pain [name] is a very pleasant 74 year old male who is a new patient to this practice who presents today for 8 weeks of left hip pain.  He states he has been seen at another orthopedic practice in the area and was treated for low back pain and had epidural injection which relieved a lot of his symptoms however he has tenderness over the outside of his left hip that has not gotten better.  This all began from a fall which occurred 8 weeks ago and he said he did get x-rays of his left hip which are not available for my review today but he was told that there was no fracture.  He has had difficulty walking due to pain and states he cannot lay on that side or have any pressure on it.  PERTINENT  PMH / PSH: Hypertension, obstructive sleep apnea, diabetes, hyperlipidemia, iron deficiency anemia  OBJECTIVE:   BP 139/87   Ht 5' 6"  (1.676 m)   Wt 182 lb (82.6 kg)   BMI 29.38 kg/m   No flowsheet data found.   Hip, Left: No obvious rash, erythema, ecchymosis, or edema. ROM full in all directions; Strength 3/5 in hip abduction otherwise normal in IR/ER/Flex/Ext/Add. Pelvic alignment unremarkable to inspection and palpation. Standing hip rotation and gait with positive trendelenburg. Greater trochanter with tenderness to palpation. No tenderness over piriformis. No SI joint tenderness and normal minimal SI movement. Special Test:    - FABER/FADIR test: NEG   - Passive Log Roll test: NEG   - Ober's test: Positive     ASSESSMENT/PLAN:   Greater trochanteric pain syndrome Patient with symptoms, history, and clinical presentation and exam are consistent with greater trochanteric pain syndrome on the left.  He is most tender over the greater trochanteric bursa but also has extreme left hip abductor weakness along with a Trendelenburg gait. He also has signs consistent with tight IT band. - We will treat conservatively with formal physical  therapy and he can continue doing water aerobics which has been beneficial for him in the last few weeks.  We will follow with him in 4-6 weeks to ensure he is continued to improve however if the pain gets worse I did let him know he could come in and get an injection into the greater trochanteric bursa.     Nuala Alpha, DO PGY-4, Sports Medicine Fellow [place]

## 2020-11-25 ENCOUNTER — Encounter: Payer: Self-pay | Admitting: Family Medicine

## 2020-11-26 ENCOUNTER — Encounter: Payer: Self-pay | Admitting: Hematology & Oncology

## 2020-11-26 ENCOUNTER — Inpatient Hospital Stay (HOSPITAL_BASED_OUTPATIENT_CLINIC_OR_DEPARTMENT_OTHER): Payer: Medicare Other | Admitting: Hematology & Oncology

## 2020-11-26 ENCOUNTER — Inpatient Hospital Stay: Payer: Medicare Other | Attending: Hematology & Oncology

## 2020-11-26 ENCOUNTER — Telehealth: Payer: Self-pay | Admitting: Hematology & Oncology

## 2020-11-26 ENCOUNTER — Other Ambulatory Visit: Payer: Self-pay

## 2020-11-26 VITALS — BP 132/87 | HR 91 | Temp 98.4°F | Resp 20 | Wt 183.0 lb

## 2020-11-26 DIAGNOSIS — C9210 Chronic myeloid leukemia, BCR/ABL-positive, not having achieved remission: Secondary | ICD-10-CM | POA: Insufficient documentation

## 2020-11-26 DIAGNOSIS — D45 Polycythemia vera: Secondary | ICD-10-CM | POA: Diagnosis present

## 2020-11-26 LAB — IRON AND TIBC
Iron: 28 ug/dL — ABNORMAL LOW (ref 42–163)
Saturation Ratios: 6 % — ABNORMAL LOW (ref 20–55)
TIBC: 445 ug/dL — ABNORMAL HIGH (ref 202–409)
UIBC: 417 ug/dL — ABNORMAL HIGH (ref 117–376)

## 2020-11-26 LAB — CBC WITH DIFFERENTIAL (CANCER CENTER ONLY)
Abs Immature Granulocytes: 5.4 10*3/uL — ABNORMAL HIGH (ref 0.00–0.07)
Basophils Absolute: 0.7 10*3/uL — ABNORMAL HIGH (ref 0.0–0.1)
Basophils Relative: 2 %
Eosinophils Absolute: 0.7 10*3/uL — ABNORMAL HIGH (ref 0.0–0.5)
Eosinophils Relative: 2 %
HCT: 43.6 % (ref 39.0–52.0)
Hemoglobin: 12.8 g/dL — ABNORMAL LOW (ref 13.0–17.0)
Lymphocytes Relative: 6 %
Lymphs Abs: 2 10*3/uL (ref 0.7–4.0)
MCH: 21.7 pg — ABNORMAL LOW (ref 26.0–34.0)
MCHC: 29.4 g/dL — ABNORMAL LOW (ref 30.0–36.0)
MCV: 74 fL — ABNORMAL LOW (ref 80.0–100.0)
Metamyelocytes Relative: 14 %
Monocytes Absolute: 2.4 10*3/uL — ABNORMAL HIGH (ref 0.1–1.0)
Monocytes Relative: 7 %
Myelocytes: 2 %
Neutro Abs: 22.5 10*3/uL — ABNORMAL HIGH (ref 1.7–7.7)
Neutrophils Relative %: 67 %
Platelet Count: 591 10*3/uL — ABNORMAL HIGH (ref 150–400)
RBC: 5.89 MIL/uL — ABNORMAL HIGH (ref 4.22–5.81)
RDW: 23.2 % — ABNORMAL HIGH (ref 11.5–15.5)
WBC Count: 33.6 10*3/uL — ABNORMAL HIGH (ref 4.0–10.5)
nRBC: 1 % — ABNORMAL HIGH (ref 0.0–0.2)

## 2020-11-26 LAB — CMP (CANCER CENTER ONLY)
ALT: 13 U/L (ref 0–44)
AST: 16 U/L (ref 15–41)
Albumin: 4.2 g/dL (ref 3.5–5.0)
Alkaline Phosphatase: 74 U/L (ref 38–126)
Anion gap: 8 (ref 5–15)
BUN: 13 mg/dL (ref 8–23)
CO2: 24 mmol/L (ref 22–32)
Calcium: 10.4 mg/dL — ABNORMAL HIGH (ref 8.9–10.3)
Chloride: 106 mmol/L (ref 98–111)
Creatinine: 1.12 mg/dL (ref 0.61–1.24)
GFR, Estimated: >60 mL/min (ref >60)
Glucose, Bld: 226 mg/dL — ABNORMAL HIGH (ref 70–99)
Potassium: 4.1 mmol/L (ref 3.5–5.1)
Sodium: 138 mmol/L (ref 135–145)
Total Bilirubin: 0.5 mg/dL (ref 0.3–1.2)
Total Protein: 6.3 g/dL — ABNORMAL LOW (ref 6.5–8.1)

## 2020-11-26 LAB — FERRITIN: Ferritin: 14 ng/mL — ABNORMAL LOW (ref 24–336)

## 2020-11-26 LAB — RETICULOCYTES
Immature Retic Fract: 40.8 % — ABNORMAL HIGH (ref 2.3–15.9)
RBC.: 5.75 MIL/uL (ref 4.22–5.81)
Retic Count, Absolute: 108.1 10*3/uL (ref 19.0–186.0)
Retic Ct Pct: 1.9 % (ref 0.4–3.1)

## 2020-11-26 LAB — SAVE SMEAR(SSMR), FOR PROVIDER SLIDE REVIEW

## 2020-11-26 LAB — LACTATE DEHYDROGENASE: LDH: 351 U/L — ABNORMAL HIGH (ref 98–192)

## 2020-11-26 NOTE — Progress Notes (Signed)
Hematology and Oncology Follow Up Visit  James [name] [id number] 1946-02-04 74 y.o. 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 IV iron as indicated - last received in February 2021       Interim History:  James [name] is here today for a follow-up.  He is still making some progress with recovery from his fall.  He fractured his back.  He has bursitis in the left hip.  He has been getting injections for that.  He is more mobile.  He comes in with a rolling walker.  He is getting around a little bit better.  By looking at his blood smear, I really have to believe that this polycythemia is trying to transform over to a more myeloid or myeloproliferative state.  He certainly could be transferred over to [facility].  We are sending over the BCR/ABL.  I will also send off a next generation sequencing for myeloproliferative neoplasms.  His appetite is good.  He had a decent Thanksgiving.  He has had no fever.  He has had no cough.  He has had no change in bowel or bladder habits.  He has had no rashes.  He does have some iron deficiency.    Overall, his performance status is ECOG 2.   Medications:  Allergies as of 11/26/2020      Reactions   Ibuprofen Anaphylaxis, Other (See Comments)   Upper GI Bled   Nsaids Other (See Comments)      Medication List       Accurate as of November 26, 2020  8:14 AM. If you have any questions, ask your nurse or doctor.        STOP taking these medications   Benefiber Powd Stopped by: [name], MD     TAKE these medications   amLODipine 5 MG tablet Commonly known as: NORVASC Take 5 mg by mouth 2 (two) times daily.   buPROPion 300 MG 24 hr tablet Commonly known as: WELLBUTRIN XL Take 300 mg by mouth every morning.   [employer] B-12 500 MCG Subl Generic drug:  Cyanocobalamin Place 2 tablets under the tongue daily with breakfast.   desmopressin 0.2 MG tablet Commonly known as: DDAVP Take 0.4 mg by mouth 2 (two) times daily.   fluticasone 50 MCG/ACT nasal spray Commonly known as: FLONASE Place 1 spray into both nostrils 2 (two) times daily.   glucose blood test strip OneTouch Ultra Blue Test Strip  USE TO TEST BLOOD SUGAR ONCE DAILY   hydrochlorothiazide 25 MG tablet Commonly known as: HYDRODIURIL Take 25 mg by mouth every morning.   LORazepam 0.5 MG tablet Commonly known as: ATIVAN Take 0.5 mg by mouth daily.   metFORMIN 500 MG 24 hr tablet Commonly known as: GLUCOPHAGE-XR Take 500 mg by mouth 2 (two) times daily.   moxifloxacin 0.5 % ophthalmic solution Commonly known as: VIGAMOX PLACE 1 DROP IN RIGHT EYE FOUR TIMES A DAY USE THE DAY BEFORE, DAY OF, AND DAY AFTER TREATMENT.   pantoprazole 40 MG tablet Commonly known as: PROTONIX TAKE 1 TABLET (40 MG TOTAL) BY MOUTH DAILY BEFORE BREAKFAST (MAX DAYS #30 ON INS)   PRESCRIPTION MEDICATION every 8 (eight) weeks. Gets injections in right eye at dr's office every 8 weeks   rosuvastatin 20 MG tablet Commonly known as: CRESTOR Take 20 mg by mouth at bedtime.  Synjardy XR 25-1000 MG Tb24 Generic drug: Empagliflozin-metFORMIN HCl ER Take 1 tablet by mouth every morning.   testosterone cypionate 200 MG/ML injection Commonly known as: DEPOTESTOSTERONE CYPIONATE Inject 150 mg into the muscle every 21 ( twenty-one) days.   Trulicity 1.5 LK/4.4WN Sopn Generic drug: Dulaglutide SMARTSIG:0.5 Milliliter(s) SUB-Q Once a Week   Viagra 100 MG tablet Generic drug: sildenafil Take 100 mg by mouth daily as needed for erectile dysfunction.   Xarelto 10 MG Tabs tablet Generic drug: rivaroxaban TAKE 1 TABLET BY MOUTH EVERY DAY   zolpidem 10 MG tablet Commonly known as: AMBIEN Take 10 mg by mouth at bedtime as needed.       Allergies:  Allergies  Allergen Reactions  . Ibuprofen  Anaphylaxis and Other (See Comments)    Upper GI Bled  . Nsaids Other (See Comments)    Past Medical History, Surgical history, Social history, and Family History were reviewed and updated.  Review of Systems: Review of Systems  Constitutional: Negative.   HENT: Negative.   Eyes: Negative.   Respiratory: Negative.   Cardiovascular: Negative.   Gastrointestinal: Negative.   Genitourinary: Negative.   Musculoskeletal: Negative.   Skin: Negative.   Neurological: Negative.   Endo/Heme/Allergies: Negative.   Psychiatric/Behavioral: Negative.     Physical Exam:  weight is 183 lb 0.6 oz (83 kg). His oral temperature is 98.4 F (36.9 C). His blood pressure is 132/87 and his pulse is 91. His respiration is 20 and oxygen saturation is 95%.   Wt Readings from Last 3 Encounters:  11/26/20 183 lb 0.6 oz (83 kg)  11/24/20 182 lb (82.6 kg)  10/15/20 181 lb 12.8 oz (82.5 kg)    Physical Exam Vitals reviewed.  HENT:     Head: Normocephalic and atraumatic.  Eyes:     Pupils: Pupils are equal, round, and reactive to light.  Cardiovascular:     Rate and Rhythm: Normal rate and regular rhythm.     Heart sounds: Normal heart sounds.  Pulmonary:     Effort: Pulmonary effort is normal.     Breath sounds: Normal breath sounds.  Abdominal:     General: Bowel sounds are normal.     Palpations: Abdomen is soft.  Musculoskeletal:        General: No tenderness or deformity. Normal range of motion.     Cervical back: Normal range of motion.  Lymphadenopathy:     Cervical: No cervical adenopathy.  Skin:    General: Skin is warm and dry.     Findings: No erythema or rash.  Neurological:     Mental Status: He is alert and oriented to person, place, and time.  Psychiatric:        Behavior: Behavior normal.        Thought Content: Thought content normal.        Judgment: Judgment normal.      Lab Results  Component Value Date   WBC 33.6 (H) 11/26/2020   HGB 12.8 (L) 11/26/2020   HCT  43.6 11/26/2020   MCV 74.0 (L) 11/26/2020   PLT 591 (H) 11/26/2020   Lab Results  Component Value Date   FERRITIN 19 (L) 10/15/2020   IRON 35 (L) 10/15/2020   TIBC 419 (H) 10/15/2020   UIBC 384 (H) 10/15/2020   IRONPCTSAT 8 (L) 10/15/2020   Lab Results  Component Value Date   RETICCTPCT 1.9 11/26/2020   RBC 5.75 11/26/2020   RETICCTABS 94.0 08/30/2011   No results found for: KPAFRELGTCHN,  [facility] Lab Results  Component Value Date   IGGSERUM 828 10/11/2016   IGA 177 06/22/2016   No results found for: Odetta Pink, SPEI   Chemistry      Component Value Date/Time   NA 135 10/15/2020 0747   NA 137 10/29/2017 1307   NA 134 (L) 05/10/2017 0755   K 4.0 10/15/2020 0747   K 3.6 10/29/2017 1307   K 4.2 05/10/2017 0755   CL 104 10/15/2020 0747   CL 101 10/29/2017 1307   CO2 22 10/15/2020 0747   CO2 25 10/29/2017 1307   CO2 20 (L) 05/10/2017 0755   BUN 19 10/15/2020 0747   BUN 14 10/29/2017 1307   BUN 21.0 05/10/2017 0755   CREATININE 1.08 10/15/2020 0747   CREATININE 1.4 (H) 10/29/2017 1307   CREATININE 1.3 05/10/2017 0755      Component Value Date/Time   CALCIUM 10.6 (H) 10/15/2020 0747   CALCIUM 10.3 10/29/2017 1307   CALCIUM 10.3 05/10/2017 0755   ALKPHOS 100 10/15/2020 0747   ALKPHOS 77 10/29/2017 1307   ALKPHOS 97 05/10/2017 0755   AST 14 (L) 10/15/2020 0747   AST 25 05/10/2017 0755   ALT 10 10/15/2020 0747   ALT 27 10/29/2017 1307   ALT 26 05/10/2017 0755   BILITOT 0.6 10/15/2020 0747   BILITOT 0.70 05/10/2017 0755     Impression and Plan: Mr. Snelson is 74 year old gentleman with polycythemia. He is JAK2 negative.  Again, by his blood smear, I had believe that we are looking at a myeloproliferative neoplasm.  We will see what the BCR/ABL ratio is.  We will see what the NGS panel shows.  I would like to have him back in 2 months.  We will get him back sooner if we do find something that  would suggest a chronic myeloid leukemia.   [name], MD 12/10/20218:14 AM

## 2020-11-26 NOTE — Telephone Encounter (Signed)
Appointments scheduled calendar printed per 12/10 los

## 2020-12-07 LAB — BCR/ABL

## 2020-12-09 ENCOUNTER — Telehealth: Payer: Self-pay | Admitting: Licensed Clinical Social Worker

## 2020-12-09 NOTE — Telephone Encounter (Signed)
Pt called requesting counseling appt. Scheduled pt 1/13@3   James [name], MSW, LCSW Outpatient Therapist/Triage Specialist

## 2020-12-16 ENCOUNTER — Inpatient Hospital Stay: Payer: Medicare Other

## 2020-12-16 ENCOUNTER — Other Ambulatory Visit: Payer: Self-pay

## 2020-12-16 ENCOUNTER — Other Ambulatory Visit: Payer: Self-pay | Admitting: *Deleted

## 2020-12-16 ENCOUNTER — Telehealth: Payer: Self-pay | Admitting: Pharmacist

## 2020-12-16 ENCOUNTER — Telehealth: Payer: Self-pay | Admitting: Pharmacy Technician

## 2020-12-16 ENCOUNTER — Ambulatory Visit: Payer: Medicare Other | Admitting: Hematology & Oncology

## 2020-12-16 ENCOUNTER — Encounter: Payer: Self-pay | Admitting: Hematology & Oncology

## 2020-12-16 ENCOUNTER — Other Ambulatory Visit: Payer: Medicare Other

## 2020-12-16 ENCOUNTER — Telehealth: Payer: Self-pay | Admitting: Hematology & Oncology

## 2020-12-16 ENCOUNTER — Inpatient Hospital Stay (HOSPITAL_BASED_OUTPATIENT_CLINIC_OR_DEPARTMENT_OTHER): Payer: Medicare Other | Admitting: Hematology & Oncology

## 2020-12-16 VITALS — BP 145/90 | HR 73 | Temp 98.2°F | Resp 18 | Ht 66.0 in | Wt 183.8 lb

## 2020-12-16 DIAGNOSIS — I81 Portal vein thrombosis: Secondary | ICD-10-CM

## 2020-12-16 DIAGNOSIS — D45 Polycythemia vera: Secondary | ICD-10-CM

## 2020-12-16 DIAGNOSIS — D500 Iron deficiency anemia secondary to blood loss (chronic): Secondary | ICD-10-CM

## 2020-12-16 DIAGNOSIS — C9210 Chronic myeloid leukemia, BCR/ABL-positive, not having achieved remission: Secondary | ICD-10-CM

## 2020-12-16 DIAGNOSIS — K7581 Nonalcoholic steatohepatitis (NASH): Secondary | ICD-10-CM

## 2020-12-16 DIAGNOSIS — K7469 Other cirrhosis of liver: Secondary | ICD-10-CM

## 2020-12-16 DIAGNOSIS — K7460 Unspecified cirrhosis of liver: Secondary | ICD-10-CM

## 2020-12-16 HISTORY — DX: Chronic myeloid leukemia, BCR/ABL-positive, not having achieved remission: C92.10

## 2020-12-16 LAB — CBC WITH DIFFERENTIAL (CANCER CENTER ONLY)
Abs Immature Granulocytes: 7.73 10*3/uL — ABNORMAL HIGH (ref 0.00–0.07)
Basophils Absolute: 1.7 10*3/uL — ABNORMAL HIGH (ref 0.0–0.1)
Basophils Relative: 5 %
Eosinophils Absolute: 0.3 10*3/uL (ref 0.0–0.5)
Eosinophils Relative: 1 %
HCT: 41.4 % (ref 39.0–52.0)
Hemoglobin: 12.5 g/dL — ABNORMAL LOW (ref 13.0–17.0)
Immature Granulocytes: 21 %
Lymphocytes Relative: 6 %
Lymphs Abs: 2.3 10*3/uL (ref 0.7–4.0)
MCH: 22.6 pg — ABNORMAL LOW (ref 26.0–34.0)
MCHC: 30.2 g/dL (ref 30.0–36.0)
MCV: 74.7 fL — ABNORMAL LOW (ref 80.0–100.0)
Monocytes Absolute: 3 10*3/uL — ABNORMAL HIGH (ref 0.1–1.0)
Monocytes Relative: 8 %
Neutro Abs: 21.5 10*3/uL — ABNORMAL HIGH (ref 1.7–7.7)
Neutrophils Relative %: 59 %
Platelet Count: 366 10*3/uL (ref 150–400)
RBC: 5.54 MIL/uL (ref 4.22–5.81)
RDW: 24.9 % — ABNORMAL HIGH (ref 11.5–15.5)
WBC Count: 36.6 10*3/uL — ABNORMAL HIGH (ref 4.0–10.5)
nRBC: 0.8 % — ABNORMAL HIGH (ref 0.0–0.2)

## 2020-12-16 MED ORDER — DASATINIB 100 MG PO TABS: 100.0000 mg | ORAL_TABLET | Freq: Every day | ORAL | 5 refills | Status: DC

## 2020-12-16 NOTE — Progress Notes (Signed)
Hematology and Oncology Follow Up Visit  James [name] [id number] [birth year] 74 y.o. 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 IV iron as indicated - last received in February 2021       Interim History:  James [name] is here today for an early follow-up.  Unfortunately, we now have a new problem with him.  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  When I last saw him, his white cell count was 36,000.  I sent off a BCR/ABL ratio on him.  It was positive at 88%.  This certainly is probably a transformation from this nondescript myeloproliferative process that he had.  I am unsure if this is somehow related in having the thromboembolic disease.  He is doing okay.  He really has had no specific complaints.  He did have a nice Christmas.  He has had no problems with bowels or bladder.  He has had no obvious bleeding.   He is still recovering from the fall that caused the spinal fractures.  This is been a real struggle for him.  He also has had bursitis in the left hip.  He came in, thankfully, without a walker this time.  Overall, I would have to say that his performance status is ECOG 2.    Medications:  Allergies as of 12/16/2020      Reactions   Ibuprofen Anaphylaxis, Other (See Comments)   Upper GI Bled   Nsaids Other (See Comments)      Medication List       Accurate as of December 16, 2020  2:29 PM. If you have any questions, ask your nurse or doctor.        amLODipine 5 MG tablet Commonly known as: NORVASC Take 5 mg by mouth 2 (two) times daily.   buPROPion 300 MG 24 hr tablet Commonly known as: WELLBUTRIN XL Take 300 mg by mouth every morning.   [employer] B-12 500 MCG Subl Generic drug: Cyanocobalamin Place 2 tablets under the tongue  daily with breakfast.   dasatinib 100 MG tablet Commonly known as: SPRYCEL Take 1 tablet (100 mg total) by mouth daily. Started by: [name], MD   desmopressin 0.2 MG tablet Commonly known as: DDAVP Take 0.4 mg by mouth 2 (two) times daily.   fluticasone 50 MCG/ACT nasal spray Commonly known as: FLONASE Place 1 spray into both nostrils 2 (two) times daily.   glucose blood test strip OneTouch Ultra Blue Test Strip  USE TO TEST BLOOD SUGAR ONCE DAILY   hydrochlorothiazide 25 MG tablet Commonly known as: HYDRODIURIL Take 25 mg by mouth every morning.   LORazepam 0.5 MG tablet Commonly known as: ATIVAN Take 0.5 mg by mouth daily.   metFORMIN 500 MG 24 hr tablet Commonly known as: GLUCOPHAGE-XR Take 500 mg by mouth 2 (two) times daily.   moxifloxacin 0.5 % ophthalmic solution Commonly known as: VIGAMOX PLACE 1 DROP IN RIGHT EYE FOUR TIMES A DAY USE THE DAY BEFORE, DAY OF, AND DAY AFTER TREATMENT.   pantoprazole 40 MG tablet Commonly known as: PROTONIX TAKE 1 TABLET (40 MG TOTAL) BY MOUTH DAILY BEFORE BREAKFAST (MAX DAYS #30 ON INS)   PRESCRIPTION MEDICATION every 8 (eight) weeks. Gets injections in right eye at dr's office every 8 weeks  rosuvastatin 20 MG tablet Commonly known as: CRESTOR Take 20 mg by mouth at bedtime.   Synjardy XR 25-1000 MG Tb24 Generic drug: Empagliflozin-metFORMIN HCl ER Take 1 tablet by mouth every morning.   testosterone cypionate 200 MG/ML injection Commonly known as: DEPOTESTOSTERONE CYPIONATE Inject 150 mg into the muscle every 21 ( twenty-one) days.   Trulicity 1.5 MG/0.5ML Sopn Generic drug: Dulaglutide SMARTSIG:0.5 Milliliter(s) SUB-Q Once a Week   Viagra 100 MG tablet Generic drug: sildenafil Take 100 mg by mouth daily as needed for erectile dysfunction.   Xarelto 10 MG Tabs tablet Generic drug: rivaroxaban TAKE 1 TABLET BY MOUTH EVERY DAY   zolpidem 10 MG tablet Commonly known as: AMBIEN Take 10 mg by mouth at  bedtime as needed.       Allergies:  Allergies  Allergen Reactions  . Ibuprofen Anaphylaxis and Other (See Comments)    Upper GI Bled  . Nsaids Other (See Comments)    Past Medical History, Surgical history, Social history, and Family History were reviewed and updated.  Review of Systems: Review of Systems  Constitutional: Negative.   HENT: Negative.   Eyes: Negative.   Respiratory: Negative.   Cardiovascular: Negative.   Gastrointestinal: Negative.   Genitourinary: Negative.   Musculoskeletal: Negative.   Skin: Negative.   Neurological: Negative.   Endo/Heme/Allergies: Negative.   Psychiatric/Behavioral: Negative.     Physical Exam:  height is 5' 6" (1.676 m) and weight is 183 lb 12.8 oz (83.4 kg). His oral temperature is 98.2 F (36.8 C). His blood pressure is 145/90 (abnormal) and his pulse is 73. His respiration is 18 and oxygen saturation is 98%.   Wt Readings from Last 3 Encounters:  12/16/20 183 lb 12.8 oz (83.4 kg)  11/26/20 183 lb 0.6 oz (83 kg)  11/24/20 182 lb (82.6 kg)    Physical Exam Vitals reviewed.  HENT:     Head: Normocephalic and atraumatic.  Eyes:     Pupils: Pupils are equal, round, and reactive to light.  Cardiovascular:     Rate and Rhythm: Normal rate and regular rhythm.     Heart sounds: Normal heart sounds.  Pulmonary:     Effort: Pulmonary effort is normal.     Breath sounds: Normal breath sounds.  Abdominal:     General: Bowel sounds are normal.     Palpations: Abdomen is soft.  Musculoskeletal:        General: No tenderness or deformity. Normal range of motion.     Cervical back: Normal range of motion.  Lymphadenopathy:     Cervical: No cervical adenopathy.  Skin:    General: Skin is warm and dry.     Findings: No erythema or rash.  Neurological:     Mental Status: He is alert and oriented to person, place, and time.  Psychiatric:        Behavior: Behavior normal.        Thought Content: Thought content normal.         Judgment: Judgment normal.      Lab Results  Component Value Date   WBC 36.6 (H) 12/16/2020   HGB 12.5 (L) 12/16/2020   HCT 41.4 12/16/2020   MCV 74.7 (L) 12/16/2020   PLT 366 12/16/2020   Lab Results  Component Value Date   FERRITIN 14 (L) 11/26/2020   IRON 28 (L) 11/26/2020   TIBC 445 (H) 11/26/2020   UIBC 417 (H) 11/26/2020   IRONPCTSAT 6 (L) 11/26/2020   Lab Results  Component   Value Date   RETICCTPCT 1.9 11/26/2020   RBC 5.54 12/16/2020   RETICCTABS 94.0 08/30/2011   No results found for: Nils Pyle [facility] Lab Results  Component Value Date   IGGSERUM 828 10/11/2016   IGA 177 06/22/2016   No results found for: Odetta Pink, SPEI   Chemistry      Component Value Date/Time   NA 138 11/26/2020 0750   NA 137 10/29/2017 1307   NA 134 (L) 05/10/2017 0755   K 4.1 11/26/2020 0750   K 3.6 10/29/2017 1307   K 4.2 05/10/2017 0755   CL 106 11/26/2020 0750   CL 101 10/29/2017 1307   CO2 24 11/26/2020 0750   CO2 25 10/29/2017 1307   CO2 20 (L) 05/10/2017 0755   BUN 13 11/26/2020 0750   BUN 14 10/29/2017 1307   BUN 21.0 05/10/2017 0755   CREATININE 1.12 11/26/2020 0750   CREATININE 1.4 (H) 10/29/2017 1307   CREATININE 1.3 05/10/2017 0755      Component Value Date/Time   CALCIUM 10.4 (H) 11/26/2020 0750   CALCIUM 10.3 10/29/2017 1307   CALCIUM 10.3 05/10/2017 0755   ALKPHOS 74 11/26/2020 0750   ALKPHOS 77 10/29/2017 1307   ALKPHOS 97 05/10/2017 0755   AST 16 11/26/2020 0750   AST 25 05/10/2017 0755   ALT 13 11/26/2020 0750   ALT 27 10/29/2017 1307   ALT 26 05/10/2017 0755   BILITOT 0.5 11/26/2020 0750   BILITOT 0.70 05/10/2017 0755     Impression and Plan: James [name] is 75 year old gentleman with a long history of polycythemia. He is JAK2 negative.  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  He is positive for the BCR/ABL gene product.  This is clearly would  explain the rise in his white blood cells.  It would explain the abnormalities on his blood smear.  He deftly has immature myeloid cells on the blood smear.  I do not see any thing that looks like blasts.  I think he would be a good candidate for Sprycel.  I would start him on Sprycel at 100 mg a day.  I went over the side effects with him.  We have to watch out for fluid retention.  He may need to have an EKG done looking for QT prolongation.  I do think that the Sprycel will be well-tolerated.  We will put the order in.  It may take a couple weeks for him to ultimately get this.  I do not think we need a bone marrow biopsy.  We do need an ultrasound of his abdomen so we can get a baseline for his spleen.  I would like to see him back in about 4 weeks or so.  Hopefully, we will start to see the Sprycel working as white cell count coming down and the BCR/ABL product decreasing.   [name], MD 12/30/20212:29 PM

## 2020-12-16 NOTE — Telephone Encounter (Signed)
Oral Oncology Pharmacist Encounter  Received new prescription for Sprycel (dasatinib) for the treatment of newly diagnosed CML, planned duration until disease progression or unacceptable drug toxicity.  Prescription dose and frequency assessed.   Current medication list in Epic reviewed, a few DDIs with dasatinib identified: -Pantoprazole: pantoprazole may decrease the concentration of dasatinib. The recommendation is to get use PPIs while taking dasatinib as it mat decrease effectiveness. Recommend weaning off of his PPI. If acid suppression is needed, antacids taken 2 hours before or after dasatinib administration can be considered. -Xarelto: dasatinib may increase the anticoagulant effect of Xarelto. Monitor patient for evidence of bleeding. No baseline dose adjustment recommended.  Evaluated chart and no patient barriers to medication adherence identified.   Prescription has been e-scribed to the [facility] for benefits analysis and approval.  Oral Oncology Clinic will continue to follow for insurance authorization, copayment issues, initial counseling and start date.  Darl Pikes, PharmD, BCPS, BCOP, CPP Hematology/Oncology Clinical Pharmacist Practitioner [facility]/HP/AP [facility] [place] [facility] [phone]  12/16/2020 1:03 PM

## 2020-12-16 NOTE — Telephone Encounter (Signed)
[name] Encounter  Received notification from [employer] that prior authorization for Sprycel is required.  PA submitted on CoverMyMeds Key [id number]  Status is pending  Oral Oncology Clinic will continue to follow.  [place] Patient [place] Phone [phone] Fax [phone] 12/16/2020 2:37 PM

## 2020-12-16 NOTE — Telephone Encounter (Signed)
Appointments scheduled calendar printed & mailed per 12/30 los

## 2020-12-20 NOTE — Telephone Encounter (Signed)
[name] Encounter  Prior Authorization for Sprycel has been approved.    PA# [id number] Effective dates: 12/16/20 through 12/17/21  Patients co-pay is $2320.90  Oral Oncology Clinic will continue to follow.   [place] Patient [place] Phone [phone] Fax [phone] 12/20/2020 2:32 PM

## 2020-12-22 ENCOUNTER — Telehealth: Payer: Self-pay | Admitting: Nurse Practitioner

## 2020-12-22 NOTE — Telephone Encounter (Signed)
Inbound call from patient wanting to make [place] aware that he was recently diagnosed with Leukemia and will be having some type of scan of his spleen on 01/03/21.

## 2020-12-22 NOTE — Telephone Encounter (Signed)
Beth, please thank him for letting us know.  Reviewed Dr. Antonieta Pert recent office note and it sounds like Mr. Macconnell is overall doing well and has started on treatment. I hope he continues to feel well . He should call us if he has any GI needs. Thanks.

## 2020-12-22 NOTE — Telephone Encounter (Signed)
[name] called and spoke with James [name], pharmacist. She explained his high copay with insurance and that we could apply to the manufacturer for assistance.  He will stop by the office on Friday to sign the application.  James [name] [place] Phone [phone] Fax [phone] 12/22/2020 11:15 AM

## 2020-12-23 ENCOUNTER — Inpatient Hospital Stay: Payer: Medicare Other | Admitting: Hematology & Oncology

## 2020-12-23 ENCOUNTER — Inpatient Hospital Stay: Payer: Medicare Other

## 2020-12-23 NOTE — Telephone Encounter (Signed)
Thanks for letting me know James [name], sorry to hear this. He can follow up with [place] as needed

## 2020-12-23 NOTE — Telephone Encounter (Signed)
Called to talk with the patient. No answer. Left a message on his voicemail.

## 2020-12-24 ENCOUNTER — Other Ambulatory Visit: Payer: Self-pay | Admitting: *Deleted

## 2020-12-24 ENCOUNTER — Ambulatory Visit (HOSPITAL_BASED_OUTPATIENT_CLINIC_OR_DEPARTMENT_OTHER): Payer: Medicare Other

## 2020-12-24 ENCOUNTER — Other Ambulatory Visit: Payer: Self-pay

## 2020-12-24 ENCOUNTER — Ambulatory Visit (INDEPENDENT_AMBULATORY_CARE_PROVIDER_SITE_OTHER): Payer: Medicare Other

## 2020-12-24 ENCOUNTER — Telehealth: Payer: Self-pay

## 2020-12-24 ENCOUNTER — Telehealth: Payer: Self-pay | Admitting: Pharmacy Technician

## 2020-12-24 DIAGNOSIS — I714 Abdominal aortic aneurysm, without rupture: Secondary | ICD-10-CM

## 2020-12-24 DIAGNOSIS — D45 Polycythemia vera: Secondary | ICD-10-CM

## 2020-12-24 DIAGNOSIS — R161 Splenomegaly, not elsewhere classified: Secondary | ICD-10-CM | POA: Diagnosis not present

## 2020-12-24 DIAGNOSIS — C9210 Chronic myeloid leukemia, BCR/ABL-positive, not having achieved remission: Secondary | ICD-10-CM

## 2020-12-24 MED ORDER — DASATINIB 100 MG PO TABS: 100.0000 mg | ORAL_TABLET | Freq: Every day | ORAL | 11 refills | Status: DC

## 2020-12-24 MED FILL — SPRYCEL 100 MG TABLET: 100 | 30 days supply | Qty: 30 | Fill #0

## 2020-12-24 NOTE — Telephone Encounter (Signed)
Oral Chemotherapy Pharmacist Encounter  Patient Education I spoke with patient and his wife, Caryl Asp, for overview of new oral chemotherapy medication: Sprycel (dasatinib) for the treatment of CML, planned duration until disease progression or unacceptable drug toxicity.   Pt is doing well. Counseled patient on administration, dosing, side effects, monitoring, drug-food interactions, safe handling, storage, and disposal.  Patient will take 100 mg daily with or without food.  Side effects include but not limited to: myelosuppression, diarrhea, headache, and edema.    Reviewed with patient importance of keeping a medication schedule and plan for any missed doses.  An application for manufacturer assistance was completed and submitted 12/24/2020. Patient will start therapy with a 30-day trial offered by [facility]. Continued treatment with Sprycel may be dependent on approval for manufacturer assistance given large co-pay for Sprycel.   Both Mr. and Mrs. Dede voiced understanding and appreciation. All questions answered.  Provided patient with Oral [facility] phone number. Patient knows to call the office with questions or concerns. Oral Chemotherapy [facility] will continue to follow.  Eddie Candle, PharmD, BCPS PGY2 Hematology/Oncology Pharmacy Resident Oral [facility] 12/24/2020 12:24 PM

## 2020-12-24 NOTE — Telephone Encounter (Signed)
[facility] called for report on patient from [place] today. Diane asked for Dr.Ennever to review [place]. Printed and gave to MD for review.

## 2020-12-27 ENCOUNTER — Encounter: Payer: Self-pay | Admitting: *Deleted

## 2020-12-27 NOTE — Telephone Encounter (Signed)
Called patient and informed him of his [place] results per Dr.Ennever. patient verbalized understanding and also stated he does not use my-chart often, so please call with any results or information for him.

## 2020-12-28 NOTE — Telephone Encounter (Signed)
Patient stopped by the [facility] on 0/2/89 to sign application for BMS patient assistance for Sprycel.    Application was faxed to BMS on 12/24/20.  Will follow up with BMS to check status of application.  [place] Patient [place] Phone [phone] Fax [phone] 12/28/2020 11:20 AM

## 2020-12-30 ENCOUNTER — Other Ambulatory Visit: Payer: Self-pay

## 2020-12-30 ENCOUNTER — Ambulatory Visit: Payer: Medicare Other | Admitting: Licensed Clinical Social Worker

## 2020-12-30 NOTE — Progress Notes (Signed)
Pt sick--short of breath and with wife gone to get covid test. Pt opted to RSD appt to 1/25@11 . Emailed pt new appt time  Jeanmarie Plant, MSW, LCSW Outpatient Therapist/Triage Specialist

## 2021-01-04 ENCOUNTER — Telehealth: Payer: Self-pay | Admitting: Pharmacist

## 2021-01-04 DIAGNOSIS — D72829 Elevated white blood cell count, unspecified: Secondary | ICD-10-CM

## 2021-01-04 DIAGNOSIS — E119 Type 2 diabetes mellitus without complications: Secondary | ICD-10-CM

## 2021-01-04 NOTE — Telephone Encounter (Signed)
[name] Encounter  Called BMS Access Support on 1/13 after receiving summary of benefits. Summary of benefits referred patient to apply for a grant through [employer] ([employer]).  I informed them that they have a long turn around time and that when I tried to apply for the patient his income exceeded their limitations. Rep noted that income was too high for PSI and referred to management for approval to send to [employer]MSPAF.  Called Access Support today 01/04/21 to check status of application.  Rep stated that patient had been referred to the Patient Assistance [employer] on 12/31/20.  I called [employer] to check the status of his application and his application was received and is being processed.  I will call and check status in a couple days to see the status of his application.  [place] Patient [place] Phone [phone] Fax [phone] 01/04/2021 4:21 PM

## 2021-01-04 NOTE — Telephone Encounter (Signed)
Oral Chemotherapy Pharmacist Encounter   Patient called me on 01/03/21 to ask some additional questions about his Sprycel because he received the packet of information I sent him in the mail. We talked about the use of protection during sex and re-enforced the need to wean off his PPI due to the interaction with Sprycel. He asked about the platelet precautions mentioned in the medication handout, his platelet count is currently in normal range. He understands that if his platelt count becomes low, he should follow those precautions at that time.  Called patient on 01/04/21 to give me an updated medication list based on his pharmacy fill list. Updates made based on reported medications. Also let the patient know that his Sprycel assistance application had moved on to the second phase of processing, this is usually a good sign. We still await final approval.  Darl Pikes, PharmD, BCPS, BCOP, CPP Hematology/Oncology Clinical Pharmacist [facility] [phone]  01/04/2021 4:31 PM

## 2021-01-04 NOTE — Telephone Encounter (Signed)
[name] Encounter  Patient stopped by the [facility] to complete application for [place] in an effort to reduce patient's out of pocket expense for Sprycel to $0.    Application completed and faxed to [phone].   [place] Patient [place] phone number for follow up is [phone].   This encounter will be updated until final determination.   [place] Patient [place] Phone [phone] Fax [phone]

## 2021-01-06 NOTE — Telephone Encounter (Signed)
[name] Encounter  Patient called to let us know that he was approved for [place] Patient [facility] and will receive Sprycel from the manufacturer at $0 out of pocket until 12/17/21.    Alyson spoke with James [name] and he knows he will have to re-apply for assistance next year.  Specialty Pharmacy that will dispense medication is [facility]  Patient is scheduled to receive his first shipment on 01/13/21.  Patient knows to call the office with questions or concerns.   Oral Oncology Clinic will continue to follow.  [place] Patient [place] Phone [phone] Fax [phone] 01/06/2021 1:58 PM

## 2021-01-10 ENCOUNTER — Telehealth: Payer: Self-pay | Admitting: *Deleted

## 2021-01-10 NOTE — Telephone Encounter (Signed)
Message received from patient requesting proof of service and treatment plan for his insurance plans request.  Call placed back to patient and patient informed that Dr. Antonieta Pert last office note could be printed for him if he would like.  Pt requests that office note be left in the front office for pick up.  Dr. Antonieta Pert last office note printed and taken to the front office for pt pick up.

## 2021-01-13 ENCOUNTER — Telehealth: Payer: Self-pay | Admitting: Pharmacist

## 2021-01-13 NOTE — Telephone Encounter (Signed)
Oral Chemotherapy Pharmacist Encounter   Patient called me to let me know his manufacturer Sprycel arrived in the mail today. He stated that the dispensing pharmacy wanted a medication list. Faxed a med list to [place].  Asked patient how he was doing with his Sprycel and if he had any side effects. He reported having a "nervous stomach" for about an hour after taking his Sprycel each day. He would not describe it as nausea and it does not impact his day. He take his Sprycel each day around 11am.   Darl Pikes, PharmD, BCPS, BCOP, CPP Hematology/Oncology Clinical Pharmacist [facility] [place] [facility] [phone]  01/13/2021 11:14 AM

## 2021-01-24 ENCOUNTER — Ambulatory Visit (INDEPENDENT_AMBULATORY_CARE_PROVIDER_SITE_OTHER): Payer: Medicare Other | Admitting: Family Medicine

## 2021-01-24 ENCOUNTER — Other Ambulatory Visit: Payer: Self-pay

## 2021-01-24 ENCOUNTER — Ambulatory Visit: Payer: Medicare Other | Admitting: Family Medicine

## 2021-01-24 ENCOUNTER — Encounter: Payer: Self-pay | Admitting: Family Medicine

## 2021-01-24 DIAGNOSIS — M25559 Pain in unspecified hip: Secondary | ICD-10-CM

## 2021-01-24 NOTE — Assessment & Plan Note (Addendum)
Patient presents with pain over posterior aspect of greater trochanter due to weakness of his hip adductor muscles. Due to location of pain as posterolateral to greater trochanter with trendelenberg gait and weakness in medial glutes and adductors, reassured patient this is not likely due to lumbar spine compression fx. He is presently in PT, making progress with overall decreased pain, but still has significant weakness in those muscles. Recommend continued PT and counseled on compliance with home exercises for maximal effect. Expect it will take several months to build back up adductors and medial glutes to improve strength. Patient could potentially benefit from CSI, not interested at this time. Recommend follow up in 6 weeks.

## 2021-01-24 NOTE — Patient Instructions (Signed)
You have IT band syndrome with trochanteric bursitis. Avoid painful activities as much as possible. Ice over area of pain 3-4 times a day for 15 minutes at a time Hip side raise exercise 3 sets of 10 once a day - add weights if this becomes too easy - you have a lot of work to do on this muscle group! Continue physical therapy and do home exercises on days you don't go to therapy. Tylenol as needed for pain. If not improving, can consider steroid injection. Follow up with me in 6 weeks.

## 2021-01-24 NOTE — Progress Notes (Addendum)
    SUBJECTIVE:   CHIEF COMPLAINT / HPI: left hip pain  Left Hip pain: 75 yo man presenting for f/u of left hip pain s/p fall in September 2021. He was seen at [facility] immediately after the fall and had lumbar MRI that showed compression fracture of L2 and anterolisthesis of L4-L5. He had an epidural injection at that time and has not had further pain in his back, but is concerned today that his L2 compression fracture may be cause of left postero-lateral hip pain. He reports that he has been in PT at [facility] and has made improvement (requires less ice and tylenol for pain during the day than previously), but still has pain and significant "limping" when walking. He is not interested in CSI at this time, reports he previously had an injection at [facility] likely in November, but unclear if it was CSI or "numbing" medication by patient report.  Pain primarily lateral left hip though improved compared to last visit.  PERTINENT  PMH / PSH: L2 compression fracture, L greater trochanteric bursitis, recent CML dx  OBJECTIVE:   BP 130/70   Ht 5' 6"  (1.676 m)   Wt 175 lb (79.4 kg)   BMI 28.25 kg/m   Nursing note and vitals reviewed GEN: age-appropriate WM, resting comfortably in chair, NAD, WNWD HEENT: NCAT. Sclera without injection or icterus.  Lumbar spine: - Inspection: no gross deformity or asymmetry, swelling or ecchymosis. No skin changes - Palpation: No TTP over the spinous processes, paraspinal muscles, or SI joints b/l Hip, left: No obvious rash, erythema, ecchymosis, or edema. ROM full in all directions; Strength 3/5 in abd/add. Trendelenberg gait present. Greater trochanter with tenderness to palpation, mostly posterior. No tenderness over piriformis. Neuro: Alert and at baseline Psych: Pleasant and appropriate   ASSESSMENT/PLAN:   Greater trochanteric pain syndrome Patient presents with pain over posterior aspect of greater trochanter due to weakness of his hip adductor  muscles. Due to location of pain as posterolateral to greater trochanter with trendelenberg gait and weakness in medial glutes and adductors, reassured patient this is not likely due to lumbar spine compression fx. He is presently in PT, making progress with overall decreased pain, but still has significant weakness in those muscles. Recommend continued PT and counseled on compliance with home exercises for maximal effect. Expect it will take several months to build back up adductors and medial glutes to improve strength. Patient could potentially benefit from CSI, not interested at this time. Recommend follow up in 6 weeks.     Gladys Damme, MD [place]

## 2021-01-27 ENCOUNTER — Inpatient Hospital Stay: Payer: Medicare Other | Attending: Hematology & Oncology

## 2021-01-27 ENCOUNTER — Other Ambulatory Visit: Payer: Self-pay | Admitting: Pharmacist

## 2021-01-27 ENCOUNTER — Inpatient Hospital Stay (HOSPITAL_BASED_OUTPATIENT_CLINIC_OR_DEPARTMENT_OTHER): Payer: Medicare Other | Admitting: Hematology & Oncology

## 2021-01-27 ENCOUNTER — Ambulatory Visit: Payer: Medicare Other | Admitting: Hematology & Oncology

## 2021-01-27 ENCOUNTER — Other Ambulatory Visit: Payer: Self-pay

## 2021-01-27 ENCOUNTER — Other Ambulatory Visit: Payer: Medicare Other

## 2021-01-27 ENCOUNTER — Other Ambulatory Visit: Payer: Self-pay | Admitting: *Deleted

## 2021-01-27 VITALS — BP 135/68 | HR 81 | Temp 98.3°F | Resp 20 | Wt 185.1 lb

## 2021-01-27 DIAGNOSIS — C9210 Chronic myeloid leukemia, BCR/ABL-positive, not having achieved remission: Secondary | ICD-10-CM

## 2021-01-27 DIAGNOSIS — Z794 Long term (current) use of insulin: Secondary | ICD-10-CM | POA: Diagnosis not present

## 2021-01-27 DIAGNOSIS — E119 Type 2 diabetes mellitus without complications: Secondary | ICD-10-CM | POA: Diagnosis not present

## 2021-01-27 LAB — CBC WITH DIFFERENTIAL (CANCER CENTER ONLY)
Abs Immature Granulocytes: 0.01 10*3/uL (ref 0.00–0.07)
Basophils Absolute: 0 10*3/uL (ref 0.0–0.1)
Basophils Relative: 1 %
Eosinophils Absolute: 0.1 10*3/uL (ref 0.0–0.5)
Eosinophils Relative: 2 %
HCT: 38.1 % — ABNORMAL LOW (ref 39.0–52.0)
Hemoglobin: 11.4 g/dL — ABNORMAL LOW (ref 13.0–17.0)
Immature Granulocytes: 0 %
Lymphocytes Relative: 10 %
Lymphs Abs: 0.4 10*3/uL — ABNORMAL LOW (ref 0.7–4.0)
MCH: 25.2 pg — ABNORMAL LOW (ref 26.0–34.0)
MCHC: 29.9 g/dL — ABNORMAL LOW (ref 30.0–36.0)
MCV: 84.1 fL (ref 80.0–100.0)
Monocytes Absolute: 0.3 10*3/uL (ref 0.1–1.0)
Monocytes Relative: 9 %
Neutro Abs: 2.9 10*3/uL (ref 1.7–7.7)
Neutrophils Relative %: 78 %
Platelet Count: 115 10*3/uL — ABNORMAL LOW (ref 150–400)
RBC: 4.53 MIL/uL (ref 4.22–5.81)
RDW: 28.9 % — ABNORMAL HIGH (ref 11.5–15.5)
WBC Count: 3.7 10*3/uL — ABNORMAL LOW (ref 4.0–10.5)
nRBC: 0 % (ref 0.0–0.2)

## 2021-01-27 LAB — CMP (CANCER CENTER ONLY)
ALT: 17 U/L (ref 0–44)
AST: 21 U/L (ref 15–41)
Albumin: 4.3 g/dL (ref 3.5–5.0)
Alkaline Phosphatase: 110 U/L (ref 38–126)
Anion gap: 6 (ref 5–15)
BUN: 12 mg/dL (ref 8–23)
CO2: 27 mmol/L (ref 22–32)
Calcium: 10.4 mg/dL — ABNORMAL HIGH (ref 8.9–10.3)
Chloride: 105 mmol/L (ref 98–111)
Creatinine: 1.13 mg/dL (ref 0.61–1.24)
GFR, Estimated: >60 mL/min (ref >60)
Glucose, Bld: 141 mg/dL — ABNORMAL HIGH (ref 70–99)
Potassium: 4.2 mmol/L (ref 3.5–5.1)
Sodium: 138 mmol/L (ref 135–145)
Total Bilirubin: 0.5 mg/dL (ref 0.3–1.2)
Total Protein: 6.4 g/dL — ABNORMAL LOW (ref 6.5–8.1)

## 2021-01-27 LAB — LACTATE DEHYDROGENASE: LDH: 179 U/L (ref 98–192)

## 2021-01-27 MED ORDER — DASATINIB 70 MG PO TABS: 70.0000 mg | ORAL_TABLET | Freq: Every day | ORAL | 4 refills | Status: DC

## 2021-01-27 NOTE — Progress Notes (Signed)
Hematology and Oncology Follow Up Visit  James [name] [id number] [birth year] 75 y.o. 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 IV iron as indicated - last received in February 2021       Interim History:  James [name] is here today for follow-up.  We now are dealing with CML.  We started him on Sprycel at 100 mg a day.  I am sure that this has worked very well.  His white cell count is come down quite nicely.    When we first saw him, the BCR/ABL was 88%.  He has tolerated the Sprycel well.  He has had no problems with fluid retention.  There has been no diarrhea.  He has had no nausea or vomiting.  He is still recovering from the horrible fall that he had back in the fall of last year.  He is still doing some physical therapy.  He has had no issues with rashes.  He does have diabetes.  He is on insulin.  Currently, he has had no infections.  He has had no cough or shortness of breath.  He has had no leg swelling.  He has had no bleeding.  Overall, his performance status is ECOG 1.    Medications:  Allergies as of 01/27/2021      Reactions   Ibuprofen Anaphylaxis, Other (See Comments)   Upper GI Bled   Nsaids Other (See Comments)      Medication List       Accurate as of January 27, 2021 11:07 AM. If you have any questions, ask your nurse or doctor.        STOP taking these medications   pantoprazole 40 MG tablet Commonly known as: PROTONIX Stopped by: [name], MD     TAKE these medications   amLODipine 5 MG tablet Commonly known as: NORVASC Take 5 mg by mouth 2 (two) times daily.   BENZONATATE PO Take by mouth as needed for cough.   buPROPion 300 MG 24 hr tablet Commonly known as: WELLBUTRIN XL Take 300 mg by  mouth every morning.   CEPHALEXIN PO Take by mouth. Prior to dental procedure   [employer] B-12 SL Place 500 mg under the tongue daily.   CVS B-12 500 MCG Subl Generic drug: Cyanocobalamin Place 2 tablets under the tongue daily with breakfast.   dasatinib 100 MG tablet Commonly known as: SPRYCEL Take 1 tablet (100 mg total) by mouth daily.   desmopressin 0.2 MG tablet Commonly known as: DDAVP Take 0.4 mg by mouth 2 (two) times daily.   fluticasone 50 MCG/ACT nasal spray Commonly known as: FLONASE Place 1 spray into both nostrils 2 (two) times daily.   glucose blood test strip OneTouch Ultra Blue Test Strip  USE TO TEST BLOOD SUGAR ONCE DAILY   insulin glargine 100 UNIT/ML injection Commonly known as: LANTUS Inject 100 Units into the skin daily.   LORazepam 0.5 MG tablet Commonly known as: ATIVAN Take 0.5 mg by mouth daily.   metFORMIN 500 MG 24 hr tablet Commonly known as: GLUCOPHAGE-XR Take 500 mg by mouth 2 (two) times daily.   moxifloxacin 0.5 % ophthalmic solution Commonly known as: VIGAMOX PLACE 1 DROP IN RIGHT EYE FOUR TIMES A DAY USE THE  DAY BEFORE, DAY OF, AND DAY AFTER TREATMENT.   PRESCRIPTION MEDICATION every 8 (eight) weeks. Gets injections in right eye at dr's office every 8 weeks   rosuvastatin 20 MG tablet Commonly known as: CRESTOR Take 20 mg by mouth at bedtime.   testosterone cypionate 200 MG/ML injection Commonly known as: DEPOTESTOSTERONE CYPIONATE Inject 150 mg into the muscle every 21 ( twenty-one) days.   Trulicity 1.5 MG/5.0IB Sopn Generic drug: Dulaglutide SMARTSIG:0.5 Milliliter(s) SUB-Q Once a Week   Viagra 100 MG tablet Generic drug: sildenafil Take 100 mg by mouth daily as needed for erectile dysfunction.   Xarelto 10 MG Tabs tablet Generic drug: rivaroxaban TAKE 1 TABLET BY MOUTH EVERY DAY   zolpidem 10 MG tablet Commonly known as: AMBIEN Take 10 mg by mouth at bedtime as needed.       Allergies:  Allergies  Allergen  Reactions  . Ibuprofen Anaphylaxis and Other (See Comments)    Upper GI Bled  . Nsaids Other (See Comments)    Past Medical History, Surgical history, Social history, and Family History were reviewed and updated.  Review of Systems: Review of Systems  Constitutional: Negative.   HENT: Negative.   Eyes: Negative.   Respiratory: Negative.   Cardiovascular: Negative.   Gastrointestinal: Negative.   Genitourinary: Negative.   Musculoskeletal: Negative.   Skin: Negative.   Neurological: Negative.   Endo/Heme/Allergies: Negative.   Psychiatric/Behavioral: Negative.     Physical Exam:  weight is 185 lb 0.8 oz (83.9 kg). His tympanic temperature is 98.3 F (36.8 C). His blood pressure is 135/68 and his pulse is 81. His respiration is 20 and oxygen saturation is 98%.   Wt Readings from Last 3 Encounters:  01/27/21 185 lb 0.8 oz (83.9 kg)  01/24/21 175 lb (79.4 kg)  12/16/20 183 lb 12.8 oz (83.4 kg)    Physical Exam Vitals reviewed.  HENT:     Head: Normocephalic and atraumatic.  Eyes:     Pupils: Pupils are equal, round, and reactive to light.  Cardiovascular:     Rate and Rhythm: Normal rate and regular rhythm.     Heart sounds: Normal heart sounds.  Pulmonary:     Effort: Pulmonary effort is normal.     Breath sounds: Normal breath sounds.  Abdominal:     General: Bowel sounds are normal.     Palpations: Abdomen is soft.  Musculoskeletal:        General: No tenderness or deformity. Normal range of motion.     Cervical back: Normal range of motion.  Lymphadenopathy:     Cervical: No cervical adenopathy.  Skin:    General: Skin is warm and dry.     Findings: No erythema or rash.  Neurological:     Mental Status: He is alert and oriented to person, place, and time.  Psychiatric:        Behavior: Behavior normal.        Thought Content: Thought content normal.        Judgment: Judgment normal.      Lab Results  Component Value Date   WBC 3.7 (L) 01/27/2021    HGB 11.4 (L) 01/27/2021   HCT 38.1 (L) 01/27/2021   MCV 84.1 01/27/2021   PLT 115 (L) 01/27/2021   Lab Results  Component Value Date   FERRITIN 14 (L) 11/26/2020   IRON 28 (L) 11/26/2020   TIBC 445 (H) 11/26/2020   UIBC 417 (H) 11/26/2020   IRONPCTSAT 6 (L) 11/26/2020   Lab  Results  Component Value Date   RETICCTPCT 1.9 11/26/2020   RBC 4.53 01/27/2021   RETICCTABS 94.0 08/30/2011   No results found for: Nils Pyle [facility] Lab Results  Component Value Date   IGGSERUM 828 10/11/2016   IGA 177 06/22/2016   No results found for: Odetta Pink, SPEI   Chemistry      Component Value Date/Time   NA 138 01/27/2021 0949   NA 137 10/29/2017 1307   NA 134 (L) 05/10/2017 0755   K 4.2 01/27/2021 0949   K 3.6 10/29/2017 1307   K 4.2 05/10/2017 0755   CL 105 01/27/2021 0949   CL 101 10/29/2017 1307   CO2 27 01/27/2021 0949   CO2 25 10/29/2017 1307   CO2 20 (L) 05/10/2017 0755   BUN 12 01/27/2021 0949   BUN 14 10/29/2017 1307   BUN 21.0 05/10/2017 0755   CREATININE 1.13 01/27/2021 0949   CREATININE 1.4 (H) 10/29/2017 1307   CREATININE 1.3 05/10/2017 0755      Component Value Date/Time   CALCIUM 10.4 (H) 01/27/2021 0949   CALCIUM 10.3 10/29/2017 1307   CALCIUM 10.3 05/10/2017 0755   ALKPHOS 110 01/27/2021 0949   ALKPHOS 77 10/29/2017 1307   ALKPHOS 97 05/10/2017 0755   AST 21 01/27/2021 0949   AST 25 05/10/2017 0755   ALT 17 01/27/2021 0949   ALT 27 10/29/2017 1307   ALT 26 05/10/2017 0755   BILITOT 0.5 01/27/2021 0949   BILITOT 0.70 05/10/2017 0755     Impression and Plan: Mr. Mickelson is 75 year old gentleman with a long history of polycythemia. He is JAK2 negative.  He has transformed over to [facility].  We will see what his BCR/ABL level is now.  I think we can probably decrease the Sprycel to 70 mg a day.  I think this would be reasonable.  His blood count, including platelet count is come down quite  nicely.  I do not want to see him get cytopenic with his blood counts.  I am just happy that he is tolerated everything quite nicely.  For right now, we will plan to get him back in another 4 weeks.  He still might be on the 100 mg dose.  He started a new bottle recently.  I told him just to finish this up.     [name], MD 2/10/202211:07 AM

## 2021-01-27 NOTE — Progress Notes (Signed)
Oral Chemotherapy Pharmacist Encounter   Sprycel Rx sent to [place] in error. Patient fills medication with [employer] patient assistance. Rx redirected to [facility].  Darl Pikes, PharmD, BCPS, BCOP, CPP Hematology/Oncology Clinical Pharmacist [facility] [place] [facility] [phone]  01/27/2021 4:31 PM

## 2021-01-28 LAB — IRON AND TIBC
Iron: 32 ug/dL — ABNORMAL LOW (ref 42–163)
Saturation Ratios: 8 % — ABNORMAL LOW (ref 20–55)
TIBC: 390 ug/dL (ref 202–409)
UIBC: 358 ug/dL (ref 117–376)

## 2021-01-28 LAB — FERRITIN: Ferritin: 24 ng/mL (ref 24–336)

## 2021-02-07 ENCOUNTER — Telehealth: Payer: Self-pay | Admitting: Pharmacist

## 2021-02-07 DIAGNOSIS — K219 Gastro-esophageal reflux disease without esophagitis: Secondary | ICD-10-CM

## 2021-02-07 NOTE — Telephone Encounter (Signed)
Oral Chemotherapy Pharmacist Encounter   Patient called to report he has been taking Tums and Gaviscon. Medications were not previously not on his medication list, they have been added to his medication list. This medications interact with his Sprycel (dasatinib). Interaction management has been reviewed: administer antacids 2 hours before or 2 hours after dasatinib. Mr. Gleaves stated his understanding of managing this interaction.   Darl Pikes, PharmD, BCPS, BCOP, CPP Hematology/Oncology Clinical Pharmacist [facility] [place] [facility] [phone]  02/07/2021 11:23 AM

## 2021-02-10 ENCOUNTER — Other Ambulatory Visit: Payer: Self-pay | Admitting: *Deleted

## 2021-02-10 DIAGNOSIS — C9210 Chronic myeloid leukemia, BCR/ABL-positive, not having achieved remission: Secondary | ICD-10-CM

## 2021-02-10 DIAGNOSIS — E119 Type 2 diabetes mellitus without complications: Secondary | ICD-10-CM

## 2021-02-10 NOTE — Progress Notes (Signed)
Call received from patient requesting a referral for a dietician.  Order placed per pt.'s request.

## 2021-02-14 ENCOUNTER — Telehealth: Payer: Self-pay | Admitting: Hematology & Oncology

## 2021-02-14 NOTE — Telephone Encounter (Signed)
Scheduled appt per 2/25 sch msg - left message for pt with appt date and time

## 2021-02-16 ENCOUNTER — Encounter: Payer: Medicare Other | Admitting: Nutrition

## 2021-02-17 ENCOUNTER — Inpatient Hospital Stay: Payer: Medicare Other | Attending: Hematology & Oncology | Admitting: Nutrition

## 2021-02-17 ENCOUNTER — Telehealth: Payer: Self-pay | Admitting: Nutrition

## 2021-02-17 DIAGNOSIS — E611 Iron deficiency: Secondary | ICD-10-CM | POA: Insufficient documentation

## 2021-02-17 DIAGNOSIS — C9210 Chronic myeloid leukemia, BCR/ABL-positive, not having achieved remission: Secondary | ICD-10-CM | POA: Insufficient documentation

## 2021-02-17 NOTE — Telephone Encounter (Signed)
Telephone appointment with 75 year old male diagnosed with CML.  He is followed by Dr. [name].  Past medical history includes diabetes on insulin, anemia, hyperlipidemia, hypertension, GERD, depression, anxiety, and polycythemia.  Medications include Lantus, Ativan, Glucophage, MiraLAX.  Labs include glucose 141.  Height: 66 inches. Weight: 185 pounds. Usual body weight: 200 pounds in August 2021. BMI: 29.87. ECOG: 1.  Patient reports recent weight loss was intentional.  He denied nutrition side effects at this time.  He is wondering how he should be eating with new diagnosis of leukemia.  Nutrition diagnosis: Food and nutrition related knowledge deficit related to CML as evidenced by no prior need for nutrition related information.  Intervention: Educated patient to continue strategies for weight maintenance during treatment. Small frequent meals and snacks with adequate calories and protein for weight maintenance. Minimize concentrated sweets. Questions were answered.  Teach back method used.  Contact information given.  Monitoring, evaluation, goals: Patient will tolerate adequate calories and protein to minimize unintentional weight loss.  No follow-up scheduled.  **Disclaimer: This note was dictated with voice recognition software. Similar sounding words can inadvertently be transcribed and this note may contain transcription errors which may not have been corrected upon publication of note.**

## 2021-02-17 NOTE — Progress Notes (Signed)
See telephone note.

## 2021-02-21 ENCOUNTER — Other Ambulatory Visit: Payer: Self-pay | Admitting: Hematology & Oncology

## 2021-02-21 DIAGNOSIS — C9210 Chronic myeloid leukemia, BCR/ABL-positive, not having achieved remission: Secondary | ICD-10-CM

## 2021-02-24 ENCOUNTER — Other Ambulatory Visit: Payer: Self-pay

## 2021-02-24 ENCOUNTER — Inpatient Hospital Stay: Payer: Medicare Other

## 2021-02-24 ENCOUNTER — Inpatient Hospital Stay (HOSPITAL_BASED_OUTPATIENT_CLINIC_OR_DEPARTMENT_OTHER): Payer: Medicare Other | Admitting: Hematology & Oncology

## 2021-02-24 ENCOUNTER — Encounter: Payer: Self-pay | Admitting: Hematology & Oncology

## 2021-02-24 VITALS — BP 147/84 | HR 86 | Temp 98.2°F | Resp 18 | Wt 189.0 lb

## 2021-02-24 DIAGNOSIS — C9210 Chronic myeloid leukemia, BCR/ABL-positive, not having achieved remission: Secondary | ICD-10-CM

## 2021-02-24 DIAGNOSIS — E611 Iron deficiency: Secondary | ICD-10-CM | POA: Diagnosis not present

## 2021-02-24 LAB — CMP (CANCER CENTER ONLY)
ALT: 16 U/L (ref 0–44)
AST: 18 U/L (ref 15–41)
Albumin: 4.2 g/dL (ref 3.5–5.0)
Alkaline Phosphatase: 121 U/L (ref 38–126)
Anion gap: 7 (ref 5–15)
BUN: 12 mg/dL (ref 8–23)
CO2: 25 mmol/L (ref 22–32)
Calcium: 10.2 mg/dL (ref 8.9–10.3)
Chloride: 107 mmol/L (ref 98–111)
Creatinine: 1.13 mg/dL (ref 0.61–1.24)
GFR, Estimated: >60 mL/min (ref >60)
Glucose, Bld: 99 mg/dL (ref 70–99)
Potassium: 4.4 mmol/L (ref 3.5–5.1)
Sodium: 139 mmol/L (ref 135–145)
Total Bilirubin: 0.4 mg/dL (ref 0.3–1.2)
Total Protein: 7.1 g/dL (ref 6.5–8.1)

## 2021-02-24 LAB — CBC WITH DIFFERENTIAL (CANCER CENTER ONLY)
Abs Immature Granulocytes: 0.01 10*3/uL (ref 0.00–0.07)
Basophils Absolute: 0.1 10*3/uL (ref 0.0–0.1)
Basophils Relative: 1 %
Eosinophils Absolute: 0.1 10*3/uL (ref 0.0–0.5)
Eosinophils Relative: 1 %
HCT: 37.3 % — ABNORMAL LOW (ref 39.0–52.0)
Hemoglobin: 11.1 g/dL — ABNORMAL LOW (ref 13.0–17.0)
Immature Granulocytes: 0 %
Lymphocytes Relative: 14 %
Lymphs Abs: 1 10*3/uL (ref 0.7–4.0)
MCH: 25.5 pg — ABNORMAL LOW (ref 26.0–34.0)
MCHC: 29.8 g/dL — ABNORMAL LOW (ref 30.0–36.0)
MCV: 85.6 fL (ref 80.0–100.0)
Monocytes Absolute: 1.4 10*3/uL — ABNORMAL HIGH (ref 0.1–1.0)
Monocytes Relative: 20 %
Neutro Abs: 4.3 10*3/uL (ref 1.7–7.7)
Neutrophils Relative %: 64 %
Platelet Count: 517 10*3/uL — ABNORMAL HIGH (ref 150–400)
RBC: 4.36 MIL/uL (ref 4.22–5.81)
RDW: 26.2 % — ABNORMAL HIGH (ref 11.5–15.5)
WBC Count: 6.7 10*3/uL (ref 4.0–10.5)
nRBC: 0 % (ref 0.0–0.2)

## 2021-02-24 LAB — LACTATE DEHYDROGENASE: LDH: 170 U/L (ref 98–192)

## 2021-02-24 LAB — SAVE SMEAR(SSMR), FOR PROVIDER SLIDE REVIEW
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
## 2021-02-25 ENCOUNTER — Telehealth: Payer: Self-pay

## 2021-02-25 LAB — FERRITIN: Ferritin: 21 ng/mL — ABNORMAL LOW (ref 24–336)

## 2021-02-25 LAB — IRON AND TIBC
Iron: 17 ug/dL — ABNORMAL LOW (ref 42–163)
Saturation Ratios: 5 % — ABNORMAL LOW (ref 20–55)
TIBC: 378 ug/dL (ref 202–409)
UIBC: 360 ug/dL (ref 117–376)

## 2021-02-25 NOTE — Telephone Encounter (Signed)
Pt returned my call and his iron appts have been sch per result note 02/25/21     Tel Hevia

## 2021-03-02 LAB — BCR/ABL

## 2021-03-03 ENCOUNTER — Other Ambulatory Visit: Payer: Self-pay

## 2021-03-03 ENCOUNTER — Inpatient Hospital Stay: Payer: Medicare Other

## 2021-03-03 VITALS — BP 143/83 | HR 77 | Temp 98.0°F | Resp 20

## 2021-03-03 DIAGNOSIS — C9210 Chronic myeloid leukemia, BCR/ABL-positive, not having achieved remission: Secondary | ICD-10-CM | POA: Diagnosis not present

## 2021-03-03 DIAGNOSIS — D500 Iron deficiency anemia secondary to blood loss (chronic): Secondary | ICD-10-CM

## 2021-03-03 MED ORDER — SODIUM CHLORIDE 0.9 % IV SOLN
INTRAVENOUS | Status: DC
  Filled 2021-03-03: qty 250

## 2021-03-03 MED ORDER — FERUMOXYTOL INJECTION 510 MG/17 ML
510.0000 mg | Freq: Once | INTRAVENOUS | Status: AC
  Administered 2021-03-03: 510 mg via INTRAVENOUS
  Filled 2021-03-03: qty 17

## 2021-03-03 NOTE — Patient Instructions (Signed)
Ferumoxytol injection What is this medicine? FERUMOXYTOL is an iron complex. Iron is used to make healthy red blood cells, which carry oxygen and nutrients throughout the body. This medicine is used to treat iron deficiency anemia. This medicine may be used for other purposes; ask your health care provider or pharmacist if you have questions. COMMON BRAND NAME(S): Feraheme What should I tell my health care provider before I take this medicine? They need to know if you have any of these conditions:  anemia not caused by low iron levels  high levels of iron in the blood  magnetic resonance imaging (MRI) test scheduled  an unusual or allergic reaction to iron, other medicines, foods, dyes, or preservatives  pregnant or trying to get pregnant  breast-feeding How should I use this medicine? This medicine is for injection into a vein. It is given by a health care professional in a hospital or clinic setting. Talk to your pediatrician regarding the use of this medicine in children. Special care may be needed. Overdosage: If you think you have taken too much of this medicine contact a poison control center or emergency room at once. NOTE: This medicine is only for you. Do not share this medicine with others. What if I miss a dose? It is important not to miss your dose. Call your doctor or health care professional if you are unable to keep an appointment. What may interact with this medicine? This medicine may interact with the following medications:  other iron products This list may not describe all possible interactions. Give your health care provider a list of all the medicines, herbs, non-prescription drugs, or dietary supplements you use. Also tell them if you smoke, drink alcohol, or use illegal drugs. Some items may interact with your medicine. What should I watch for while using this medicine? Visit your doctor or healthcare professional regularly. Tell your doctor or healthcare  professional if your symptoms do not start to get better or if they get worse. You may need blood work done while you are taking this medicine. You may need to follow a special diet. Talk to your doctor. Foods that contain iron include: whole grains/cereals, dried fruits, beans, or peas, leafy green vegetables, and organ meats (liver, kidney). What side effects may I notice from receiving this medicine? Side effects that you should report to your doctor or health care professional as soon as possible:  allergic reactions like skin rash, itching or hives, swelling of the face, lips, or tongue  breathing problems  changes in blood pressure  feeling faint or lightheaded, falls  fever or chills  flushing, sweating, or hot feelings  swelling of the ankles or feet Side effects that usually do not require medical attention (report to your doctor or health care professional if they continue or are bothersome):  diarrhea  headache  nausea, vomiting  stomach pain This list may not describe all possible side effects. Call your doctor for medical advice about side effects. You may report side effects to FDA at 1-800-FDA-1088. Where should I keep my medicine? This drug is given in a hospital or clinic and will not be stored at home. NOTE: This sheet is a summary. It may not cover all possible information. If you have questions about this medicine, talk to your doctor, pharmacist, or health care provider.  2021 Elsevier/Gold Standard (2017-01-22 20:21:10)  

## 2021-03-07 ENCOUNTER — Other Ambulatory Visit: Payer: Self-pay

## 2021-03-07 ENCOUNTER — Ambulatory Visit (INDEPENDENT_AMBULATORY_CARE_PROVIDER_SITE_OTHER): Payer: Medicare Other | Admitting: Family Medicine

## 2021-03-07 VITALS — BP 130/64 | Ht 66.0 in | Wt 171.0 lb

## 2021-03-07 DIAGNOSIS — M25559 Pain in unspecified hip: Secondary | ICD-10-CM

## 2021-03-07 NOTE — Patient Instructions (Signed)
You're doing much better. Continue with physical therapy - will place focus more on hip flexor strengthening and prevention of potential falls. Icing, tylenol as needed. Follow up in 6 weeks.

## 2021-03-09 ENCOUNTER — Encounter: Payer: Self-pay | Admitting: Family Medicine

## 2021-03-09 NOTE — Progress Notes (Signed)
PCP: Leanna Battles, MD  Subjective:   HPI: Patient is a 75 y.o. male here for left hip pain.  2/7: Left Hip pain: 75 yo man presenting for f/u of left hip pain s/p fall in September 2021. He was seen [facility] immediately after the fall and had lumbar MRI that showed compression fracture of L2 and anterolisthesis of L4-L5. He had an epidural injection at that time and has not had further pain in his back, but is concerned today that his L2 compression fracture may be cause of left postero-lateral hip pain. He reports that he has been in PT at [place] and has made improvement (requires less ice and tylenol for pain during the day than previously), but still has pain and significant "limping" when walking. He is not interested in CSI at this time, reports he previously had an injection at [facility] likely in November, but unclear if it was CSI or "numbing" medication by patient report.  Pain primarily lateral left hip though improved compared to last visit.  3/21: Patient reports his strength is improving with physical therapy but very slowly. His hip pain is much better. Is sleeping better. Not needing tylenol now. Concern about his balance and stooping when walking. Feels a little unsteady.  Past Medical History:  Diagnosis Date  . Anxiety   . Arthritis   . Clotting disorder ([place])   . CML (chronic myelocytic leukemia) ([place]) 12/16/2020  . Depression   . DM (diabetes mellitus) ([place])   . GERD (gastroesophageal reflux disease)   . Hiatal hernia   . History of shingles 04/03/2014  . HTN (hypertension)   . Hyperlipidemia   . Insomnia    resolved by using CPAP  . Iron deficiency anemia due to chronic blood loss 11/20/2016  . Macular degeneration   . Nocturia more than twice per night 11/11/2014   For 6 month, 5 nocturias a night.   . Obesity   . 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 Portal vein thrombosis   . Rhinitis   . Situational depression   . Sleep apnea     uses CPAP every night  . Tubular adenoma 12/10/2015   6 cecum polyps    Current Outpatient Medications on File Prior to Visit  Medication Sig Dispense Refill  . Alum Hydroxide-Mag Carbonate (GAVISCON PO) Take by mouth.    [name] amLODipine (NORVASC) 5 MG tablet Take 5 mg by mouth 2 (two) times daily.     [name] BENZONATATE PO Take by mouth as needed for cough.    [name] buPROPion (WELLBUTRIN XL) 300 MG 24 hr tablet Take 300 mg by mouth every morning.    . Calcium Carbonate Antacid (TUMS PO) Take by mouth.    . CEPHALEXIN PO Take by mouth. Prior to dental procedure (Patient not taking: Reported on 01/27/2021)    . CVS B-12 500 MCG SUBL Place 2 tablets under the tongue daily with breakfast.  (Patient not taking: Reported on 01/27/2021)  12  . Cyanocobalamin (CVS B-12 SL) Place 500 mg under the tongue daily.    . dasatinib (SPRYCEL) 70 MG tablet Take 1 tablet (70 mg total) by mouth daily. 30 tablet 4  . desmopressin (DDAVP) 0.2 MG tablet Take 0.4 mg by mouth 2 (two) times daily.     . fluticasone (FLONASE) 50 MCG/ACT nasal spray Place 1 spray into both nostrils 2 (two) times daily.    [name] glucose blood test strip OneTouch Ultra Blue Test Strip  USE TO TEST  BLOOD SUGAR ONCE DAILY    . insulin glargine (LANTUS) 100 UNIT/ML injection Inject 100 Units into the skin daily.    . Lidocaine (HM LIDOCAINE PATCH) 4 % PTCH Apply topically daily as needed.    [name] LORazepam (ATIVAN) 0.5 MG tablet Take 0.5 mg by mouth daily.   3  . metFORMIN (GLUCOPHAGE-XR) 500 MG 24 hr tablet Take 500 mg by mouth 2 (two) times daily.    [name] moxifloxacin (VIGAMOX) 0.5 % ophthalmic solution PLACE 1 DROP IN RIGHT EYE FOUR TIMES A DAY USE THE DAY BEFORE, DAY OF, AND DAY AFTER TREATMENT.  5  . pantoprazole (PROTONIX) 40 MG tablet Take 40 mg by mouth 2 (two) times daily.    . polyethylene glycol (MIRALAX / GLYCOLAX) 17 g packet Take 17 g by mouth daily as needed.    [name] PRESCRIPTION MEDICATION every 8 (eight) weeks. Gets injections in right eye at dr's  office every 8 weeks     . rosuvastatin (CRESTOR) 20 MG tablet Take 20 mg by mouth at bedtime.    [name] testosterone cypionate (DEPOTESTOSTERONE CYPIONATE) 200 MG/ML injection Inject 150 mg into the muscle every 21 ( twenty-one) days.     . TRULICITY 1.5 QB/1.6XI SOPN SMARTSIG:0.5 Milliliter(s) SUB-Q Once a Week    . VIAGRA 100 MG tablet Take 100 mg by mouth daily as needed for erectile dysfunction.   1  . XARELTO 10 MG TABS tablet TAKE 1 TABLET BY MOUTH EVERY DAY 30 tablet 11  . zolpidem (AMBIEN) 10 MG tablet Take 10 mg by mouth at bedtime as needed.     No current facility-administered medications on file prior to visit.    Past Surgical History:  Procedure Laterality Date  . CARDIAC CATHETERIZATION     greater 10 yrs ago, normal  . CARPAL TUNNEL RELEASE     bilateral  . COLONOSCOPY  06/2005   [facility] Dr Lajoyce Corners  . ESOPHAGOGASTRODUODENOSCOPY (EGD) WITH PROPOFOL N/A 09/28/2016   Procedure: ESOPHAGOGASTRODUODENOSCOPY (EGD) WITH PROPOFOL;  Surgeon: Gatha Mayer, MD;  Location: [facility];  Service: Endoscopy;  Laterality: N/A;  . IR GENERIC HISTORICAL  12/12/2016   IR US GUIDE VASC ACCESS RIGHT 12/12/2016 [name], MD [facility]  . IR GENERIC HISTORICAL  12/12/2016   IR VENOGRAM HEPATIC W HEMODYNAMIC EVALUATION 12/12/2016 [name], MD [facility]  . IR GENERIC HISTORICAL  12/12/2016   IR TRANSCATHETER BX 12/12/2016 [name], MD [facility]  . ROTATOR CUFF REPAIR     right  . TENDON REPAIR     left arm  . TONSILLECTOMY    . TOTAL KNEE ARTHROPLASTY Right 2010  . WISDOM TOOTH EXTRACTION      Allergies  Allergen Reactions  . Ibuprofen Anaphylaxis and Other (See Comments)    Upper GI Bled  . Nsaids Other (See Comments)    Social History   Socioeconomic History  . Marital status: Married    Spouse name: Not on file  . Number of children: 2  . Years of education: Not on file  . Highest education level: Not on file  Occupational History  . Occupation:  retired  Tobacco Use  . Smoking status: Never Smoker  . Smokeless tobacco: Never Used  . Tobacco comment: never used tobacco  Vaping Use  . Vaping Use: Never used  Substance and Sexual Activity  . Alcohol use: Yes    Alcohol/week: 0.0 standard drinks  . Drug use: No  . Sexual activity: Not on file  Other  Topics Concern  . Not on file  Social History Narrative   Right handed.  Caffeine 3 cups daily,  Married 2 kids (one deceased).  College grad.  [employer].    Social Determinants of Health   Financial Resource Strain: Not on file  Food Insecurity: Not on file  Transportation Needs: Not on file  Physical Activity: Not on file  Stress: Not on file  Social Connections: Not on file  Intimate Partner Violence: Not on file    Family History  Problem Relation Age of Onset  . Bipolar disorder Son        committed suicide  . Colon cancer Other   . Heart failure Other   . Heart attack Other   . Lung disease Father        history  . Diabetes Mother        history  . Stroke Mother        history  . Colon polyps Neg Hx   . Rectal cancer Neg Hx   . Stomach cancer Neg Hx   . Esophageal cancer Neg Hx     BP 130/64   Ht 5' 6"  (1.676 m)   Wt 171 lb (77.6 kg)   BMI 27.60 kg/m   No flowsheet data found.  No flowsheet data found.  Review of Systems: See HPI above.     Objective:  Physical Exam:  Gen: NAD, comfortable in exam room  Left hip: No deformity. FROM with 5/5 strength except 4+/5 hip abduction. No tenderness to palpation currently. NVI distally. Negative faber, fadir, logroll.  Gait: Walks with back in about 15 degrees flexion.  Scuffing left foot every few steps with short strides.  No pedestal turning.   Assessment & Plan:  1. Left hip pain - IT band syndrome and trochanteric bursitis improving.  Continue working with physical therapy on strengthening - place more focus on hip flexors, add vestibular eval/rehab also.  Tylenol,  icing only if needed.  F/u in 6 weeks.

## 2021-03-10 ENCOUNTER — Other Ambulatory Visit: Payer: Self-pay

## 2021-03-10 ENCOUNTER — Inpatient Hospital Stay: Payer: Medicare Other

## 2021-03-10 VITALS — BP 141/66 | HR 76 | Temp 98.5°F | Resp 17

## 2021-03-10 DIAGNOSIS — D500 Iron deficiency anemia secondary to blood loss (chronic): Secondary | ICD-10-CM

## 2021-03-10 DIAGNOSIS — C9210 Chronic myeloid leukemia, BCR/ABL-positive, not having achieved remission: Secondary | ICD-10-CM | POA: Diagnosis not present

## 2021-03-10 MED ORDER — SODIUM CHLORIDE 0.9 % IV SOLN
510.0000 mg | Freq: Once | INTRAVENOUS | Status: AC
  Administered 2021-03-10: 510 mg via INTRAVENOUS
  Filled 2021-03-10: qty 17

## 2021-03-10 MED ORDER — SODIUM CHLORIDE 0.9 % IV SOLN
INTRAVENOUS | Status: DC
  Filled 2021-03-10 (×2): qty 250

## 2021-03-10 NOTE — Patient Instructions (Signed)
Ferumoxytol injection What is this medicine? FERUMOXYTOL is an iron complex. Iron is used to make healthy red blood cells, which carry oxygen and nutrients throughout the body. This medicine is used to treat iron deficiency anemia. This medicine may be used for other purposes; ask your health care provider or pharmacist if you have questions. COMMON BRAND NAME(S): Feraheme What should I tell my health care provider before I take this medicine? They need to know if you have any of these conditions:  anemia not caused by low iron levels  high levels of iron in the blood  magnetic resonance imaging (MRI) test scheduled  an unusual or allergic reaction to iron, other medicines, foods, dyes, or preservatives  pregnant or trying to get pregnant  breast-feeding How should I use this medicine? This medicine is for injection into a vein. It is given by a health care professional in a hospital or clinic setting. Talk to your pediatrician regarding the use of this medicine in children. Special care may be needed. Overdosage: If you think you have taken too much of this medicine contact a poison control center or emergency room at once. NOTE: This medicine is only for you. Do not share this medicine with others. What if I miss a dose? It is important not to miss your dose. Call your doctor or health care professional if you are unable to keep an appointment. What may interact with this medicine? This medicine may interact with the following medications:  other iron products This list may not describe all possible interactions. Give your health care provider a list of all the medicines, herbs, non-prescription drugs, or dietary supplements you use. Also tell them if you smoke, drink alcohol, or use illegal drugs. Some items may interact with your medicine. What should I watch for while using this medicine? Visit your doctor or healthcare professional regularly. Tell your doctor or healthcare  professional if your symptoms do not start to get better or if they get worse. You may need blood work done while you are taking this medicine. You may need to follow a special diet. Talk to your doctor. Foods that contain iron include: whole grains/cereals, dried fruits, beans, or peas, leafy green vegetables, and organ meats (liver, kidney). What side effects may I notice from receiving this medicine? Side effects that you should report to your doctor or health care professional as soon as possible:  allergic reactions like skin rash, itching or hives, swelling of the face, lips, or tongue  breathing problems  changes in blood pressure  feeling faint or lightheaded, falls  fever or chills  flushing, sweating, or hot feelings  swelling of the ankles or feet Side effects that usually do not require medical attention (report to your doctor or health care professional if they continue or are bothersome):  diarrhea  headache  nausea, vomiting  stomach pain This list may not describe all possible side effects. Call your doctor for medical advice about side effects. You may report side effects to FDA at 1-800-FDA-1088. Where should I keep my medicine? This drug is given in a hospital or clinic and will not be stored at home. NOTE: This sheet is a summary. It may not cover all possible information. If you have questions about this medicine, talk to your doctor, pharmacist, or health care provider.  2021 Elsevier/Gold Standard (2017-01-22 20:21:10)  

## 2021-03-17 ENCOUNTER — Other Ambulatory Visit (HOSPITAL_COMMUNITY): Payer: Self-pay

## 2021-03-24 ENCOUNTER — Inpatient Hospital Stay (HOSPITAL_BASED_OUTPATIENT_CLINIC_OR_DEPARTMENT_OTHER): Payer: Medicare Other | Admitting: Hematology & Oncology

## 2021-03-24 ENCOUNTER — Encounter: Payer: Self-pay | Admitting: Hematology & Oncology

## 2021-03-24 ENCOUNTER — Inpatient Hospital Stay: Payer: Medicare Other | Attending: Hematology & Oncology

## 2021-03-24 ENCOUNTER — Other Ambulatory Visit: Payer: Self-pay

## 2021-03-24 ENCOUNTER — Telehealth: Payer: Self-pay | Admitting: *Deleted

## 2021-03-24 VITALS — BP 136/72 | HR 81 | Temp 98.6°F | Resp 16 | Wt 171.0 lb

## 2021-03-24 DIAGNOSIS — C9210 Chronic myeloid leukemia, BCR/ABL-positive, not having achieved remission: Secondary | ICD-10-CM | POA: Insufficient documentation

## 2021-03-24 DIAGNOSIS — Z7901 Long term (current) use of anticoagulants: Secondary | ICD-10-CM | POA: Insufficient documentation

## 2021-03-24 DIAGNOSIS — D500 Iron deficiency anemia secondary to blood loss (chronic): Secondary | ICD-10-CM | POA: Diagnosis not present

## 2021-03-24 LAB — CMP (CANCER CENTER ONLY)
ALT: 20 U/L (ref 0–44)
AST: 28 U/L (ref 15–41)
Albumin: 4.5 g/dL (ref 3.5–5.0)
Alkaline Phosphatase: 113 U/L (ref 38–126)
Anion gap: 7 (ref 5–15)
BUN: 15 mg/dL (ref 8–23)
CO2: 27 mmol/L (ref 22–32)
Calcium: 11.2 mg/dL — ABNORMAL HIGH (ref 8.9–10.3)
Chloride: 107 mmol/L (ref 98–111)
Creatinine: 1.23 mg/dL (ref 0.61–1.24)
GFR, Estimated: >60 mL/min (ref >60)
Glucose, Bld: 84 mg/dL (ref 70–99)
Potassium: 5 mmol/L (ref 3.5–5.1)
Sodium: 141 mmol/L (ref 135–145)
Total Bilirubin: 0.6 mg/dL (ref 0.3–1.2)
Total Protein: 7.4 g/dL (ref 6.5–8.1)

## 2021-03-24 LAB — CBC WITH DIFFERENTIAL (CANCER CENTER ONLY)
Abs Immature Granulocytes: 0.05 10*3/uL (ref 0.00–0.07)
Basophils Absolute: 0 10*3/uL (ref 0.0–0.1)
Basophils Relative: 1 %
Eosinophils Absolute: 0.2 10*3/uL (ref 0.0–0.5)
Eosinophils Relative: 4 %
HCT: 47.2 % (ref 39.0–52.0)
Hemoglobin: 14.4 g/dL (ref 13.0–17.0)
Immature Granulocytes: 1 %
Lymphocytes Relative: 11 %
Lymphs Abs: 0.6 10*3/uL — ABNORMAL LOW (ref 0.7–4.0)
MCH: 27.2 pg (ref 26.0–34.0)
MCHC: 30.5 g/dL (ref 30.0–36.0)
MCV: 89.2 fL (ref 80.0–100.0)
Monocytes Absolute: 0.7 10*3/uL (ref 0.1–1.0)
Monocytes Relative: 13 %
Neutro Abs: 3.6 10*3/uL (ref 1.7–7.7)
Neutrophils Relative %: 70 %
Platelet Count: 232 10*3/uL (ref 150–400)
RBC: 5.29 MIL/uL (ref 4.22–5.81)
RDW: 25.8 % — ABNORMAL HIGH (ref 11.5–15.5)
WBC Count: 5.1 10*3/uL (ref 4.0–10.5)
nRBC: 0 % (ref 0.0–0.2)

## 2021-03-24 LAB — RETICULOCYTES
Immature Retic Fract: 5.3 % (ref 2.3–15.9)
RBC.: 5.2 MIL/uL (ref 4.22–5.81)
Retic Count, Absolute: 41.1 10*3/uL (ref 19.0–186.0)
Retic Ct Pct: 0.8 % (ref 0.4–3.1)

## 2021-03-24 LAB — LACTATE DEHYDROGENASE: LDH: 177 U/L (ref 98–192)

## 2021-03-24 LAB — SAVE SMEAR(SSMR), FOR PROVIDER SLIDE REVIEW

## 2021-03-24 NOTE — Progress Notes (Signed)
Hematology and Oncology Follow Up Visit  James [name] [id number] [birth year] 75 y.o. 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 IV iron as indicated - last received in March 2022        Interim History:  James [name] is here today for follow-up.  He is doing okay.  He still recovering from the bad fall that he had.  He fractured his pelvis.  He is still doing some physical therapy.  His Sprycel is working well for him.  He has had no problems with fluid retention.  There is no cough.  He has had no diarrhea.  His last BCR/ABL was 2.74%.  Hopefully, today's will be less than 1%.  We did go ahead and give him some IV iron last month.  This really helped.  His hemoglobin has come up to 14.  He has had no change in bowel or bladder habits.  He has had no issues with nausea or vomiting.  There is no cough.  There is no leg swelling.  Overall, his performance status is ECOG 1.       Medications:  Allergies as of 03/24/2021      Reactions   Ibuprofen Anaphylaxis, Other (See Comments)   Upper GI Bled   Nsaids Other (See Comments)      Medication List       Accurate as of March 24, 2021 11:25 AM. If you have any questions, ask your nurse or doctor.        amLODipine 5 MG tablet Commonly known as: NORVASC Take 5 mg by mouth 2 (two) times daily.   BENZONATATE PO Take by mouth as needed for cough.   buPROPion 300 MG 24 hr tablet Commonly known as: WELLBUTRIN XL Take 300 mg by mouth every morning.   CEPHALEXIN PO Take by mouth. Prior to dental procedure   CVS B-12 SL Place 500 mg under the tongue daily.   CVS B-12 500 MCG Subl Generic drug: Cyanocobalamin Place 2 tablets under the tongue daily with breakfast.   desmopressin 0.2 MG  tablet Commonly known as: DDAVP Take 0.4 mg by mouth 2 (two) times daily.   fluticasone 50 MCG/ACT nasal spray Commonly known as: FLONASE Place 1 spray into both nostrils 2 (two) times daily.   GAVISCON PO Take by mouth.   glucose blood test strip OneTouch Ultra Blue Test Strip  USE TO TEST BLOOD SUGAR ONCE DAILY   HM Lidocaine Patch 4 % Ptch Generic drug: Lidocaine Apply topically daily as needed.   insulin glargine 100 UNIT/ML injection Commonly known as: LANTUS Inject 100 Units into the skin daily.   LORazepam 0.5 MG tablet Commonly known as: ATIVAN Take 0.5 mg by mouth daily.   metFORMIN 500 MG 24 hr tablet Commonly known as: GLUCOPHAGE-XR Take 500 mg by mouth 2 (two) times daily.   moxifloxacin 0.5 % ophthalmic solution Commonly known as: VIGAMOX PLACE 1 DROP IN RIGHT EYE FOUR TIMES A DAY USE THE DAY BEFORE, DAY OF, AND DAY AFTER TREATMENT.   pantoprazole 40 MG tablet Commonly known as: PROTONIX Take 40 mg by mouth 2 (two) times daily.   polyethylene glycol 17 g packet Commonly known as: MIRALAX / GLYCOLAX Take 17 g by mouth daily as needed.  PRESCRIPTION MEDICATION every 8 (eight) weeks. Gets injections in right eye at dr's office every 8 weeks   rosuvastatin 20 MG tablet Commonly known as: CRESTOR Take 20 mg by mouth at bedtime.   Sprycel 70 MG tablet Generic drug: dasatinib TAKE 1 TABLET (70 MG TOTAL) BY MOUTH DAILY.   testosterone cypionate 200 MG/ML injection Commonly known as: DEPOTESTOSTERONE CYPIONATE Inject 150 mg into the muscle every 21 ( twenty-one) days.   Trulicity 1.5 FB/5.1WC Sopn Generic drug: Dulaglutide SMARTSIG:0.5 Milliliter(s) SUB-Q Once a Week   TUMS PO Take by mouth.   Viagra 100 MG tablet Generic drug: sildenafil Take 100 mg by mouth daily as needed for erectile dysfunction.   Xarelto 10 MG Tabs tablet Generic drug: rivaroxaban TAKE 1 TABLET BY MOUTH EVERY DAY   zolpidem 10 MG tablet Commonly known as:  AMBIEN Take 10 mg by mouth at bedtime as needed.       Allergies:  Allergies  Allergen Reactions  . Ibuprofen Anaphylaxis and Other (See Comments)    Upper GI Bled  . Nsaids Other (See Comments)    Past Medical History, Surgical history, Social history, and Family History were reviewed and updated.  Review of Systems: Review of Systems  Constitutional: Negative.   HENT: Negative.   Eyes: Negative.   Respiratory: Negative.   Cardiovascular: Negative.   Gastrointestinal: Negative.   Genitourinary: Negative.   Musculoskeletal: Negative.   Skin: Negative.   Neurological: Negative.   Endo/Heme/Allergies: Negative.   Psychiatric/Behavioral: Negative.     Physical Exam:  weight is 171 lb (77.6 kg). His oral temperature is 98.6 F (37 C). His blood pressure is 136/72 and his pulse is 81. His respiration is 16 and oxygen saturation is 98%.   Wt Readings from Last 3 Encounters:  03/24/21 171 lb (77.6 kg)  03/07/21 171 lb (77.6 kg)  02/24/21 189 lb (85.7 kg)    Physical Exam Vitals reviewed.  HENT:     Head: Normocephalic and atraumatic.  Eyes:     Pupils: Pupils are equal, round, and reactive to light.  Cardiovascular:     Rate and Rhythm: Normal rate and regular rhythm.     Heart sounds: Normal heart sounds.  Pulmonary:     Effort: Pulmonary effort is normal.     Breath sounds: Normal breath sounds.  Abdominal:     General: Bowel sounds are normal.     Palpations: Abdomen is soft.  Musculoskeletal:        General: No tenderness or deformity. Normal range of motion.     Cervical back: Normal range of motion.  Lymphadenopathy:     Cervical: No cervical adenopathy.  Skin:    General: Skin is warm and dry.     Findings: No erythema or rash.  Neurological:     Mental Status: He is alert and oriented to person, place, and time.  Psychiatric:        Behavior: Behavior normal.        Thought Content: Thought content normal.        Judgment: Judgment normal.       Lab Results  Component Value Date   WBC 5.1 03/24/2021   HGB 14.4 03/24/2021   HCT 47.2 03/24/2021   MCV 89.2 03/24/2021   PLT 232 03/24/2021   Lab Results  Component Value Date   FERRITIN 21 (L) 02/24/2021   IRON 17 (L) 02/24/2021   TIBC 378 02/24/2021   UIBC 360 02/24/2021   IRONPCTSAT 5 (L) 02/24/2021  Lab Results  Component Value Date   RETICCTPCT 0.8 03/24/2021   RBC 5.29 03/24/2021   RBC 5.20 03/24/2021   RETICCTABS 94.0 08/30/2011   No results found for: Nils Pyle [facility] Lab Results  Component Value Date   IGGSERUM 828 10/11/2016   IGA 177 06/22/2016   No results found for: Odetta Pink, SPEI   Chemistry      Component Value Date/Time   NA 139 02/24/2021 1128   NA 137 10/29/2017 1307   NA 134 (L) 05/10/2017 0755   K 4.4 02/24/2021 1128   K 3.6 10/29/2017 1307   K 4.2 05/10/2017 0755   CL 107 02/24/2021 1128   CL 101 10/29/2017 1307   CO2 25 02/24/2021 1128   CO2 25 10/29/2017 1307   CO2 20 (L) 05/10/2017 0755   BUN 12 02/24/2021 1128   BUN 14 10/29/2017 1307   BUN 21.0 05/10/2017 0755   CREATININE 1.13 02/24/2021 1128   CREATININE 1.4 (H) 10/29/2017 1307   CREATININE 1.3 05/10/2017 0755      Component Value Date/Time   CALCIUM 10.2 02/24/2021 1128   CALCIUM 10.3 10/29/2017 1307   CALCIUM 10.3 05/10/2017 0755   ALKPHOS 121 02/24/2021 1128   ALKPHOS 77 10/29/2017 1307   ALKPHOS 97 05/10/2017 0755   AST 18 02/24/2021 1128   AST 25 05/10/2017 0755   ALT 16 02/24/2021 1128   ALT 27 10/29/2017 1307   ALT 26 05/10/2017 0755   BILITOT 0.4 02/24/2021 1128   BILITOT 0.70 05/10/2017 0755     Impression and Plan: James [name] is James [name] with a long history of polycythemia. He is JAK2 negative.  He has transformed over to [facility].  We will see what his BCR/ABL level is now.  I looked at his blood smear.  Everything looks quite mature.  I do not see any  hypersegmented polys.  There were no nucleated red blood cells.  There were no blasts.  Platelets were adequate number and size.    I think that the Sprycel dose is perfect.  He is having no problems with this.  I forgot to mention that he is also on Xarelto.  He is doing well with the Xarelto.  There is no bleeding.  We will now get him back in 2 months.  Hopefully we can continue to spread his appointments out a little bit longer.    [name], MD 4/7/202211:25 AM

## 2021-03-24 NOTE — Telephone Encounter (Signed)
Per los 03/24/21 gave patient upcoming appointment

## 2021-03-25 ENCOUNTER — Encounter: Payer: Self-pay | Admitting: *Deleted

## 2021-03-25 LAB — FERRITIN: Ferritin: 191 ng/mL (ref 24–336)

## 2021-03-25 LAB — IRON AND TIBC
Iron: 108 ug/dL (ref 42–163)
Saturation Ratios: 31 % (ref 20–55)
TIBC: 353 ug/dL (ref 202–409)
UIBC: 244 ug/dL (ref 117–376)

## 2021-04-01 ENCOUNTER — Telehealth: Payer: Self-pay | Admitting: *Deleted

## 2021-04-01 LAB — BCR/ABL

## 2021-04-01 NOTE — Telephone Encounter (Signed)
Pt unavailable. lmovm for pt to call office, results and MD recommendations left on vm for pt.

## 2021-04-01 NOTE — Telephone Encounter (Signed)
-----   Message from [name], MD sent at 04/01/2021  3:51 PM EDT ----- Call - the genetic marker is down to 0.23!!  This is fantastic!!!  [place] job!!! The pill is working!!  Laurey Arrow

## 2021-04-04 ENCOUNTER — Telehealth: Payer: Self-pay | Admitting: *Deleted

## 2021-04-04 NOTE — Telephone Encounter (Signed)
Return call received from patient from Friday, 04/01/21. Pt notified per order of Dr. [name] that "the genetic marker is down to 0.23!!  This is fantastic!!!  [place] job!!! The pill is working!!  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

## 2021-04-05 ENCOUNTER — Telehealth: Payer: Self-pay

## 2021-04-05 NOTE — Telephone Encounter (Signed)
Patient left a voicemail asking for a call to reschedule his Apr 20, 2021 appointment with Jinny Blossom, NP.

## 2021-04-07 ENCOUNTER — Emergency Department (HOSPITAL_COMMUNITY): Payer: Medicare Other

## 2021-04-07 ENCOUNTER — Encounter (HOSPITAL_COMMUNITY): Payer: Self-pay

## 2021-04-07 ENCOUNTER — Emergency Department (HOSPITAL_COMMUNITY)
Admission: EM | Admit: 2021-04-07 | Discharge: 2021-04-07 | Disposition: A | Discharge status: Home / Self Care | Payer: Medicare Other | Attending: Emergency Medicine | Admitting: Emergency Medicine

## 2021-04-07 ENCOUNTER — Other Ambulatory Visit: Payer: Self-pay

## 2021-04-07 DIAGNOSIS — W07XXXA Fall from chair, initial encounter: Secondary | ICD-10-CM | POA: Insufficient documentation

## 2021-04-07 DIAGNOSIS — S7001XA Contusion of right hip, initial encounter: Secondary | ICD-10-CM | POA: Insufficient documentation

## 2021-04-07 DIAGNOSIS — Z7901 Long term (current) use of anticoagulants: Secondary | ICD-10-CM | POA: Insufficient documentation

## 2021-04-07 DIAGNOSIS — Z79899 Other long term (current) drug therapy: Secondary | ICD-10-CM | POA: Diagnosis not present

## 2021-04-07 DIAGNOSIS — E119 Type 2 diabetes mellitus without complications: Secondary | ICD-10-CM | POA: Insufficient documentation

## 2021-04-07 DIAGNOSIS — I10 Essential (primary) hypertension: Secondary | ICD-10-CM | POA: Insufficient documentation

## 2021-04-07 DIAGNOSIS — Z20822 Contact with and (suspected) exposure to covid-19: Secondary | ICD-10-CM | POA: Insufficient documentation

## 2021-04-07 DIAGNOSIS — Z794 Long term (current) use of insulin: Secondary | ICD-10-CM | POA: Diagnosis not present

## 2021-04-07 DIAGNOSIS — Z96651 Presence of right artificial knee joint: Secondary | ICD-10-CM | POA: Diagnosis not present

## 2021-04-07 DIAGNOSIS — Z7984 Long term (current) use of oral hypoglycemic drugs: Secondary | ICD-10-CM | POA: Insufficient documentation

## 2021-04-07 DIAGNOSIS — S79911A Unspecified injury of right hip, initial encounter: Secondary | ICD-10-CM | POA: Diagnosis present

## 2021-04-07 LAB — CBC WITH DIFFERENTIAL/PLATELET
Abs Immature Granulocytes: 0.04 10*3/uL (ref 0.00–0.07)
Basophils Absolute: 0 10*3/uL (ref 0.0–0.1)
Basophils Relative: 0 %
Eosinophils Absolute: 0.2 10*3/uL (ref 0.0–0.5)
Eosinophils Relative: 2 %
HCT: 39 % (ref 39.0–52.0)
Hemoglobin: 12.4 g/dL — ABNORMAL LOW (ref 13.0–17.0)
Immature Granulocytes: 0 %
Lymphocytes Relative: 8 %
Lymphs Abs: 0.7 10*3/uL (ref 0.7–4.0)
MCH: 29.2 pg (ref 26.0–34.0)
MCHC: 31.8 g/dL (ref 30.0–36.0)
MCV: 92 fL (ref 80.0–100.0)
Monocytes Absolute: 1.3 10*3/uL — ABNORMAL HIGH (ref 0.1–1.0)
Monocytes Relative: 14 %
Neutro Abs: 7.3 10*3/uL (ref 1.7–7.7)
Neutrophils Relative %: 76 %
Platelets: 195 10*3/uL (ref 150–400)
RBC: 4.24 MIL/uL (ref 4.22–5.81)
RDW: 24 % — ABNORMAL HIGH (ref 11.5–15.5)
WBC: 9.6 10*3/uL (ref 4.0–10.5)
nRBC: 0 % (ref 0.0–0.2)

## 2021-04-07 LAB — BASIC METABOLIC PANEL
Anion gap: 6 (ref 5–15)
BUN: 12 mg/dL (ref 8–23)
CO2: 22 mmol/L (ref 22–32)
Calcium: 9.6 mg/dL (ref 8.9–10.3)
Chloride: 108 mmol/L (ref 98–111)
Creatinine, Ser: 0.96 mg/dL (ref 0.61–1.24)
GFR, Estimated: >60 mL/min (ref >60)
Glucose, Bld: 113 mg/dL — ABNORMAL HIGH (ref 70–99)
Potassium: 4.3 mmol/L (ref 3.5–5.1)
Sodium: 136 mmol/L (ref 135–145)

## 2021-04-07 LAB — RESP PANEL BY RT-PCR (FLU A&B, COVID) ARPGX2
Influenza A by PCR: NEGATIVE
Influenza B by PCR: NEGATIVE
SARS Coronavirus 2 by RT PCR: NEGATIVE

## 2021-04-07 MED ORDER — OXYCODONE-ACETAMINOPHEN 5-325 MG PO TABS
1.0000 | ORAL_TABLET | Freq: Four times a day (QID) | ORAL | Status: DC | PRN
Start: 2021-04-07 — End: 2021-04-08
  Administered 2021-04-07: 1 via ORAL
  Filled 2021-04-07: qty 1

## 2021-04-07 MED ORDER — OXYCODONE-ACETAMINOPHEN 5-325 MG PO TABS: 1.0000 | ORAL_TABLET | Freq: Four times a day (QID) | ORAL | 0 refills | Status: AC | PRN

## 2021-04-07 NOTE — ED Triage Notes (Signed)
Coming from home, fell out of chair yesterday, complaining of right hip pain

## 2021-04-07 NOTE — ED Provider Notes (Signed)
[facility] DEPT Provider Note   CSN: [id number] Arrival date & time: 04/07/21  1601     History Chief Complaint  Patient presents with  . Hip Injury    James [name]. is a 75 y.o. male.  Patient reports that yesterday evening he fell out of a chair, slid off the chair and landed on his right hip.  Has been having significant pain ever since.  Pain worse with movement, improved with rest, improved after receiving the fentanyl from EMS.  No numbness or weakness or tingling.  Denies hitting his head, no neck or back pain.  HPI     Past Medical History:  Diagnosis Date  . Anxiety   . Arthritis   . Clotting disorder ([place])   . CML (chronic myelocytic leukemia) ([place]) 12/16/2020  . Depression   . DM (diabetes mellitus) ([place])   . GERD (gastroesophageal reflux disease)   . Hiatal hernia   . History of shingles 04/03/2014  . HTN (hypertension)   . Hyperlipidemia   . Insomnia    resolved by using CPAP  . Iron deficiency anemia due to chronic blood loss 11/20/2016  . Macular degeneration   . Nocturia more than twice per night 11/11/2014   For 6 month, 5 nocturias a night.   . Obesity   . 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 Portal vein thrombosis   . Rhinitis   . Situational depression   . Sleep apnea    uses CPAP every night  . Tubular adenoma 12/10/2015   6 cecum polyps    Patient Active Problem List   Diagnosis Date Noted  . CML (chronic myelocytic leukemia) ([place]) 12/16/2020  . 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 OSA on CPAP 10/30/2017  . Hypogonadism, male 10/30/2017  . Iron deficiency anemia due to chronic blood loss 11/20/2016  . Erosive gastritis with hemorrhage   . Melena   . Acute blood loss anemia 09/27/2016  . Acute upper GI bleed 09/27/2016  . Abnormal liver diagnostic imaging 06/22/2016  . 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 Portal vein thrombosis  04/29/2016  . Diabetes mellitus ([place]) 04/29/2016  . Leukocytosis 04/29/2016  . Dyspnea 12/31/2015  . Essential hypertension 12/31/2015  . Hyperlipidemia 12/31/2015  . Nocturia more than twice per night 11/11/2014  . Severe obesity (BMI >= 40) ([place]) 11/11/2014  . 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 Polycythemia vera ([place]) 11/29/2011    Past Surgical History:  Procedure Laterality Date  . CARDIAC CATHETERIZATION     greater 10 yrs ago, normal  . CARPAL TUNNEL RELEASE     bilateral  . COLONOSCOPY  06/2005   [facility] Dr Lajoyce [place]ers  . ESOPHAGOGASTRODUODENOSCOPY (EGD) WITH PROPOFOL N/A 09/28/2016   Procedure: ESOPHAGOGASTRODUODENOSCOPY (EGD) WITH PROPOFOL;  Surgeon: Gatha Mayer, MD;  Location: [facility] ENDOSCOPY;  Service: Endoscopy;  Laterality: N/A;  . IR GENERIC HISTORICAL  12/12/2016   IR US GUIDE VASC ACCESS RIGHT 12/12/2016 [name], MD [facility]  . IR GENERIC HISTORICAL  12/12/2016   IR VENOGRAM HEPATIC W HEMODYNAMIC EVALUATION 12/12/2016 [name], MD [facility]  . IR GENERIC HISTORICAL  12/12/2016   IR TRANSCATHETER BX 12/12/2016 [name], MD [facility]  . ROTATOR CUFF REPAIR     right  . TENDON REPAIR     left arm  . TONSILLECTOMY    . TOTAL KNEE ARTHROPLASTY Right 2010  . WISDOM TOOTH EXTRACTION  Family History  Problem Relation Age of Onset  . Bipolar disorder Son        committed suicide  . Colon cancer Other   . Heart failure Other   . Heart attack Other   . Lung disease Father        history  . Diabetes Mother        history  . Stroke Mother        history  . Colon polyps Neg Hx   . Rectal cancer Neg Hx   . Stomach cancer Neg Hx   . Esophageal cancer Neg Hx     Social History   Tobacco Use  . Smoking status: Never Smoker  . Smokeless tobacco: Never Used  . Tobacco comment: never used tobacco  Vaping Use  . Vaping Use: Never used  Substance Use Topics  . Alcohol use: Yes    Alcohol/week: 0.0 standard drinks  . Drug  use: No    Home Medications Prior to Admission medications   Medication Sig Start Date End Date Taking? Authorizing Provider  oxyCODONE-acetaminophen (PERCOCET/ROXICET) 5-325 MG tablet Take 1 tablet by mouth every 6 (six) hours as needed for up to 3 days for severe pain. 04/07/21 04/10/21 Yes Lucrezia Starch, MD  Alum Hydroxide-Mag Carbonate (GAVISCON PO) Take by mouth.    [provider]  amLODipine (NORVASC) 5 MG tablet Take 5 mg by mouth 2 (two) times daily.  05/25/18   [provider]  BENZONATATE PO Take by mouth as needed for cough.    [provider]  buPROPion (WELLBUTRIN XL) 300 MG 24 hr tablet Take 300 mg by mouth every morning. 07/20/20   [provider]  Calcium Carbonate Antacid (TUMS PO) Take by mouth.    [provider]  CEPHALEXIN PO Take by mouth. Prior to dental procedure Patient not taking: Reported on 01/27/2021    [provider]  CVS B-12 500 MCG SUBL Place 2 tablets under the tongue daily with breakfast.  Patient not taking: Reported on 01/27/2021 09/17/14   [provider]  Cyanocobalamin (CVS B-12 SL) Place 500 mg under the tongue daily.    [provider]  dasatinib (SPRYCEL) 70 MG tablet TAKE 1 TABLET (70 MG TOTAL) BY MOUTH DAILY. 01/27/21 01/27/22  [name], MD  desmopressin (DDAVP) 0.2 MG tablet Take 0.4 mg by mouth 2 (two) times daily.  03/21/14   [provider]  fluticasone (FLONASE) 50 MCG/ACT nasal spray Place 1 spray into both nostrils 2 (two) times daily. 10/23/19   [provider]  glucose blood test strip OneTouch Ultra Blue Test Strip  USE TO TEST BLOOD SUGAR ONCE DAILY    [provider]  insulin glargine (LANTUS) 100 UNIT/ML injection Inject 100 Units into the skin daily.    [provider]  Lidocaine (HM LIDOCAINE PATCH) 4 % PTCH Apply topically daily as needed.    [provider]  LORazepam (ATIVAN) 0.5 MG tablet Take 0.5 mg by mouth  daily.  11/01/18   [provider]  metFORMIN (GLUCOPHAGE-XR) 500 MG 24 hr tablet Take 500 mg by mouth 2 (two) times daily. 11/08/20   [provider]  moxifloxacin (VIGAMOX) 0.5 % ophthalmic solution PLACE 1 DROP IN RIGHT EYE FOUR TIMES A DAY USE THE DAY BEFORE, DAY OF, AND DAY AFTER TREATMENT. 02/15/18   [provider]  pantoprazole (PROTONIX) 40 MG tablet Take 40 mg by mouth 2 (two) times daily. 02/26/21   [provider]  polyethylene glycol (MIRALAX / GLYCOLAX) 17 g packet Take 17 g by mouth daily as needed.    [provider]  PRESCRIPTION MEDICATION every 8 (eight) weeks. Gets injections in right eye at dr's office every 8 weeks     [provider]  rosuvastatin (CRESTOR) 20 MG tablet Take 20 mg by mouth at bedtime.    [provider]  testosterone cypionate (DEPOTESTOSTERONE CYPIONATE) 200 MG/ML injection Inject 150 mg into the muscle every 21 ( twenty-one) days.     [provider]  TRULICITY 1.5 JH/4.1DE SOPN SMARTSIG:0.5 Milliliter(s) SUB-Q Once a Week 05/21/20   [provider]  VIAGRA 100 MG tablet Take 100 mg by mouth daily as needed for erectile dysfunction.  10/26/14   [provider]  XARELTO 10 MG TABS tablet TAKE 1 TABLET BY MOUTH EVERY DAY 08/12/20   [name], MD  zolpidem (AMBIEN) 10 MG tablet Take 10 mg by mouth at bedtime as needed. 05/21/20   [provider]    Allergies    Ibuprofen and Nsaids  Review of Systems   Review of Systems  Constitutional: Negative for chills and fever.  HENT: Negative for ear pain and sore throat.   Eyes: Negative for pain and visual disturbance.  Respiratory: Negative for cough and shortness of breath.   Cardiovascular: Negative for chest pain and palpitations.  Gastrointestinal: Negative for abdominal pain and vomiting.  Genitourinary: Negative for dysuria and hematuria.  Musculoskeletal: Positive for arthralgias. Negative for back pain.   Skin: Negative for color change and rash.  Neurological: Negative for seizures and syncope.  All other systems reviewed and are negative.   Physical Exam Updated Vital Signs BP 134/75   Pulse 82   Temp 98.5 F (36.9 C) (Oral)   Resp 13   SpO2 98%   Physical Exam Vitals and nursing note reviewed.  Constitutional:      Appearance: He is well-developed.  HENT:     Head: Normocephalic and atraumatic.  Eyes:     Conjunctiva/sclera: Conjunctivae normal.  Cardiovascular:     Rate and Rhythm: Normal rate and regular rhythm.     Heart sounds: No murmur heard.   Pulmonary:     Effort: Pulmonary effort is normal. No respiratory distress.     Breath sounds: Normal breath sounds.  Abdominal:     Palpations: Abdomen is soft.     Tenderness: There is no abdominal tenderness.  Musculoskeletal:     Cervical back: Neck supple.     Comments: Right lower extremity: There is tenderness over the right hip, right hip range of motion is somewhat decreased due to pain, normal DP and PT pulses, normal sensation and distal motor  Skin:    General: Skin is warm and dry.  Neurological:     Mental Status: He is alert.     ED Results / Procedures / Treatments   Labs (all labs ordered are listed, but only abnormal results are displayed) Labs Reviewed  CBC WITH DIFFERENTIAL/PLATELET - Abnormal; Notable for the following components:      Result Value   Hemoglobin 12.4 (*)    RDW 24.0 (*)    Monocytes Absolute 1.3 (*)    All other components within normal limits  BASIC METABOLIC PANEL - Abnormal; Notable for the following components:   Glucose, Bld 113 (*)    All other components within normal limits  RESP PANEL BY RT-PCR (FLU A&B, COVID) [id number]    EKG None  Radiology DG Chest 1 View  Result Date: 04/07/2021 CLINICAL DATA:  Right hip pain.  Fall EXAM: CHEST  1 VIEW COMPARISON:  10/07/2012 FINDINGS: Cardiomegaly. Lungs clear. No effusions. No acute bony abnormality. IMPRESSION: No  active disease. Electronically Signed   By: Rolm Baptise M.D.   On: 04/07/2021 17:16   CT Hip Right Wo Contrast  Result Date: 04/07/2021 CLINICAL DATA:  [place] out of chair yesterday. Significant right hip pain. EXAM: CT OF THE RIGHT HIP WITHOUT CONTRAST TECHNIQUE: Multidetector CT imaging of the right hip was performed according to the standard protocol. Multiplanar CT image reconstructions were also generated. COMPARISON:  Radiographs, same date. FINDINGS: The right hip is normally located. Mild to moderate degenerative changes but no hip or acetabular fracture. No evidence of AVN. The pubic symphysis and visualized right SI joint are intact. Mild degenerative changes. No pelvic fractures are identified. There is a large intramuscular hematoma in the right gluteus maximus muscle along with some hematoma extending into the adjacent subcutaneous fat. Insertional tendinous calcifications are noted involving the greater trochanter, lesser trochanter and also the right ischial tuberosity No significant intrapelvic abnormalities are identified. No inguinal adenopathy or hernia. IMPRESSION: 1. Large intramuscular hematoma in the right gluteus maximus muscle along with some hematoma extending into the adjacent subcutaneous fat. 2. No hip or pelvic fractures are identified. Electronically Signed   By: Marijo Sanes M.D.   On: 04/07/2021 18:32   DG Hip Unilat W or Wo Pelvis 2-3 Views Right  Result Date: 04/07/2021 CLINICAL DATA:  Fall, right hip pain EXAM: DG HIP (WITH OR WITHOUT PELVIS) 2-3V RIGHT COMPARISON:  None. FINDINGS: Early joint space narrowing and spurring in the hip joints bilaterally. SI joints symmetric and unremarkable. Enthesopathic changes along the greater trochanters bilaterally. No acute bony abnormality. Specifically, no fracture, subluxation, or dislocation. IMPRESSION: No acute bony abnormality. Electronically Signed   By: Rolm Baptise M.D.   On: 04/07/2021 17:17    Procedures Procedures    Medications Ordered in ED Medications  oxyCODONE-acetaminophen (PERCOCET/ROXICET) 5-325 MG per tablet 1 tablet (1 tablet Oral Given 04/07/21 1627)    ED Course  I have reviewed the triage vital signs and the nursing notes.  Pertinent labs & imaging results that were available during my care of the patient were reviewed by me and considered in my medical decision making (see chart for details).    MDM Rules/Calculators/A&P                         75 year old male presents to ER with concern for right hip pain after mechanical fall.  X-rays negative, given his point hip tenderness, check CT to look for occult fracture.  CT was negative for fracture but did demonstrate hematoma.  Suspect this is source of his pain at present.  Pain well controlled. Discharged home with wife. Recommended f/u with either his orthopedist or primary doctor.   After the discussed management above, the patient was determined to be safe for discharge.  The patient was in agreement with this plan and all questions regarding their care were answered.  ED return precautions were discussed and the patient will return to the ED with any significant worsening of condition.   Final Clinical Impression(s) / ED Diagnoses Final diagnoses:  Hematoma of right hip, initial encounter    Rx / DC Orders ED Discharge Orders         Ordered    oxyCODONE-acetaminophen (PERCOCET/ROXICET) 5-325 MG tablet  Every 6 hours PRN        04/07/21 1904           Lucrezia Starch, MD 04/07/21 2251

## 2021-04-07 NOTE — ED Notes (Signed)
Placed external condom catheter on patient

## 2021-04-07 NOTE — ED Notes (Signed)
Patient back from rad at this time

## 2021-04-07 NOTE — ED Notes (Signed)
Patient transported to CT 

## 2021-04-07 NOTE — ED Notes (Signed)
Patient transported to X-ray 

## 2021-04-07 NOTE — Discharge Instructions (Addendum)
Recommend Tylenol or Motrin for pain control.  For breakthrough pain can take the prescribed Percocet.  Note this can make you drowsy so be taken while driving or operating heavy machinery.  Recommend weightbearing as tolerated, recommend follow-up with Ortho or your primary doctor.

## 2021-04-08 ENCOUNTER — Ambulatory Visit: Payer: Medicare Other | Admitting: Family Medicine

## 2021-04-18 ENCOUNTER — Other Ambulatory Visit: Payer: Self-pay

## 2021-04-18 ENCOUNTER — Ambulatory Visit (INDEPENDENT_AMBULATORY_CARE_PROVIDER_SITE_OTHER): Payer: Medicare Other | Admitting: Family Medicine

## 2021-04-18 VITALS — BP 130/60

## 2021-04-18 DIAGNOSIS — S79911A Unspecified injury of right hip, initial encounter: Secondary | ICD-10-CM | POA: Diagnosis not present

## 2021-04-19 ENCOUNTER — Encounter: Payer: Self-pay | Admitting: Family Medicine

## 2021-04-19 NOTE — Progress Notes (Signed)
PCP: Leanna Battles, MD  Subjective:   HPI: Patient is a 75 y.o. male here for right hip pain.  2/7: Left Hip pain: 75 yo man presenting for f/u of left hip pain s/p fall in September 2021. He was seen at [facility] immediately after the fall and had lumbar MRI that showed compression fracture of L2 and anterolisthesis of L4-L5. He had an epidural injection at that time and has not had further pain in his back, but is concerned today that his L2 compression fracture may be cause of left postero-lateral hip pain. He reports that he has been in PT at [facility] and has made improvement (requires less ice and tylenol for pain during the day than previously), but still has pain and significant "limping" when walking. He is not interested in CSI at this time, reports he previously had an injection at [facility] likely in November, but unclear if it was CSI or "numbing" medication by patient report.  Pain primarily lateral left hip though improved compared to last visit.  3/21: Patient reports his strength is improving with physical therapy but very slowly. His hip pain is much better. Is sleeping better. Not needing tylenol now. Concern about his balance and stooping when walking. Feels a little unsteady.  5/2: Patient reports on 4/20 he was sitting on an ottoman which then slid out from under him causing him to fall directly onto right gluteal region. Immediate pain, swelling, and bruising. Had difficulty bearing weight at the time too. Went to ED and had evaluation including CT of hip that was negative for fracture but showed large gluteal hematoma. He reports over past week he has noticed significant improvement in his pain and ability to walk. Significant bruising persists and goes down leg and up into lower back. Using walker for ambulation. Physical therapy has been on hold but wanted to check with [place] before restarting this. He is still taking xarelto.  Past Medical History:   Diagnosis Date  . Anxiety   . Arthritis   . Clotting disorder ([place])   . 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 Depression   . DM (diabetes mellitus) ([place])   . GERD (gastroesophageal reflux disease)   . Hiatal hernia   . History of shingles 04/03/2014  . HTN (hypertension)   . Hyperlipidemia   . Insomnia    resolved by using CPAP  . Iron deficiency anemia due to chronic blood loss 11/20/2016  . Macular degeneration   . Nocturia more than twice per night 11/11/2014   For 6 month, 5 nocturias a night.   . Obesity   . 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 Portal vein thrombosis   . Rhinitis   . Situational depression   . Sleep apnea    uses CPAP every night  . Tubular adenoma 12/10/2015   6 cecum polyps    Current Outpatient Medications on File Prior to Visit  Medication Sig Dispense Refill  . Alum Hydroxide-Mag Carbonate (GAVISCON PO) Take by mouth.    [name] amLODipine (NORVASC) 5 MG tablet Take 5 mg by mouth 2 (two) times daily.     [name] BENZONATATE PO Take by mouth as needed for cough.    [name] buPROPion (WELLBUTRIN XL) 300 MG 24 hr tablet Take 300 mg by mouth every morning.    . Calcium Carbonate Antacid (TUMS PO) Take by mouth.    . CEPHALEXIN PO Take by mouth. Prior to dental procedure (Patient not taking: Reported on 01/27/2021)    .  [employer] B-12 500 MCG SUBL Place 2 tablets under the tongue daily with breakfast.  (Patient not taking: Reported on 01/27/2021)  12  . Cyanocobalamin (CVS B-12 SL) Place 500 mg under the tongue daily.    . dasatinib (SPRYCEL) 70 MG tablet TAKE 1 TABLET (70 MG TOTAL) BY MOUTH DAILY. 30 tablet 4  . desmopressin (DDAVP) 0.2 MG tablet Take 0.4 mg by mouth 2 (two) times daily.     . fluticasone (FLONASE) 50 MCG/ACT nasal spray Place 1 spray into both nostrils 2 (two) times daily.    [name] glucose blood test strip OneTouch Ultra Blue Test Strip  USE TO TEST BLOOD SUGAR ONCE DAILY    . insulin glargine (LANTUS) 100 UNIT/ML injection Inject 100 Units  into the skin daily.    . Lidocaine (HM LIDOCAINE PATCH) 4 % PTCH Apply topically daily as needed.    [name] LORazepam (ATIVAN) 0.5 MG tablet Take 0.5 mg by mouth daily.   3  . metFORMIN (GLUCOPHAGE-XR) 500 MG 24 hr tablet Take 500 mg by mouth 2 (two) times daily.    [name] moxifloxacin (VIGAMOX) 0.5 % ophthalmic solution PLACE 1 DROP IN RIGHT EYE FOUR TIMES A DAY USE THE DAY BEFORE, DAY OF, AND DAY AFTER TREATMENT.  5  . pantoprazole (PROTONIX) 40 MG tablet Take 40 mg by mouth 2 (two) times daily.    . polyethylene glycol (MIRALAX / GLYCOLAX) 17 g packet Take 17 g by mouth daily as needed.    . prednisoLONE acetate (PRED FORTE) 1 % ophthalmic suspension SMARTSIG:In Eye(s)    . PRESCRIPTION MEDICATION every 8 (eight) weeks. Gets injections in right eye at dr's office every 8 weeks     . rosuvastatin (CRESTOR) 20 MG tablet Take 20 mg by mouth at bedtime.    [name] testosterone cypionate (DEPOTESTOSTERONE CYPIONATE) 200 MG/ML injection Inject 150 mg into the muscle every 21 ( twenty-one) days.     . TRULICITY 1.5 FX/9.0WI SOPN SMARTSIG:0.5 Milliliter(s) SUB-Q Once a Week    . VIAGRA 100 MG tablet Take 100 mg by mouth daily as needed for erectile dysfunction.   1  . XARELTO 10 MG TABS tablet TAKE 1 TABLET BY MOUTH EVERY DAY 30 tablet 11  . zolpidem (AMBIEN) 10 MG tablet Take 10 mg by mouth at bedtime as needed.     No current facility-administered medications on file prior to visit.    Past Surgical History:  Procedure Laterality Date  . CARDIAC CATHETERIZATION     greater 10 yrs ago, normal  . CARPAL TUNNEL RELEASE     bilateral  . COLONOSCOPY  06/2005   [facility] Dr Lajoyce [place]  . ESOPHAGOGASTRODUODENOSCOPY (EGD) WITH PROPOFOL N/A 09/28/2016   Procedure: ESOPHAGOGASTRODUODENOSCOPY (EGD) WITH PROPOFOL;  Surgeon: Gatha Mayer, MD;  Location: [facility];  Service: Endoscopy;  Laterality: N/A;  . IR GENERIC HISTORICAL  12/12/2016   IR US GUIDE VASC ACCESS RIGHT 12/12/2016 [name], MD [facility]  .  IR GENERIC HISTORICAL  12/12/2016   IR VENOGRAM HEPATIC W HEMODYNAMIC EVALUATION 12/12/2016 [name], MD [facility]  . IR GENERIC HISTORICAL  12/12/2016   IR TRANSCATHETER BX 12/12/2016 [name], MD [facility]  . ROTATOR CUFF REPAIR     right  . TENDON REPAIR     left arm  . TONSILLECTOMY    . TOTAL KNEE ARTHROPLASTY Right 2010  . WISDOM TOOTH EXTRACTION      Allergies  Allergen Reactions  . Ibuprofen Anaphylaxis and Other (See Comments)  Upper GI Bled  . Nsaids Other (See Comments)    Social History   Socioeconomic History  . Marital status: Married    Spouse name: Not on file  . Number of children: 2  . Years of education: Not on file  . Highest education level: Not on file  Occupational History  . Occupation: retired  Tobacco Use  . Smoking status: Never Smoker  . Smokeless tobacco: Never Used  . Tobacco comment: never used tobacco  Vaping Use  . Vaping Use: Never used  Substance and Sexual Activity  . Alcohol use: Yes    Alcohol/week: 0.0 standard drinks  . Drug use: No  . Sexual activity: Not on file  Other Topics Concern  . Not on file  Social History Narrative   Right handed.  Caffeine 3 cups daily,  Married 2 kids (one deceased).  College grad.  [employer].    Social Determinants of Health   Financial Resource Strain: Not on file  Food Insecurity: Not on file  Transportation Needs: Not on file  Physical Activity: Not on file  Stress: Not on file  Social Connections: Not on file  Intimate Partner Violence: Not on file    Family History  Problem Relation Age of Onset  . Bipolar disorder Son        committed suicide  . Colon cancer Other   . Heart failure Other   . Heart attack Other   . Lung disease Father        history  . Diabetes Mother        history  . Stroke Mother        history  . Colon polyps Neg Hx   . Rectal cancer Neg Hx   . Stomach cancer Neg Hx   . Esophageal cancer Neg Hx     BP  130/60   No flowsheet data found.  No flowsheet data found.  Review of Systems: See HPI above.     Objective:  Physical Exam:  Gen: NAD, comfortable in exam room  Right hip: Significant bruising gluteal region to low lumbar region on right side and down right leg to about the knee posteriorly.  Swelling associated with this.  No other deformity. Mild TTP over bruised regions superficially.  No tenderness over greater trochanter. Able to abduct, flex, and extend right hip. Able to ambulate in hallway with trendelenberg gait with walker.   Assessment & Plan:  1. Right hip injury - independently reviewed his CT scan of his hip and no fractures visible.  Showed him the images of large gluteal hematoma.  He is clinically improving and I feel he can restart physical therapy and home exercises at this time.  Follow up with me in about 1 month if needed but call sooner if he doesn't continue to make progress.

## 2021-04-20 ENCOUNTER — Ambulatory Visit: Payer: Medicare Other | Admitting: Adult Health

## 2021-05-02 ENCOUNTER — Encounter: Payer: Self-pay | Admitting: Family Medicine

## 2021-05-02 ENCOUNTER — Other Ambulatory Visit: Payer: Self-pay

## 2021-05-02 ENCOUNTER — Ambulatory Visit (INDEPENDENT_AMBULATORY_CARE_PROVIDER_SITE_OTHER): Payer: Medicare Other | Admitting: Family Medicine

## 2021-05-02 VITALS — BP 132/64 | Ht 66.0 in | Wt 170.0 lb

## 2021-05-02 DIAGNOSIS — M25512 Pain in left shoulder: Secondary | ICD-10-CM

## 2021-05-02 NOTE — Progress Notes (Signed)
    SUBJECTIVE:   CHIEF COMPLAINT / HPI:   Shoulder Pain:  Patient complains of left shoulder pain.  Pain started last week after sleeping on his shoulder wrong.  Patient reports that he woke up and had some pain across his deltoid and difficulty with range of movement.  He reports that he kept his arm at his side for most of the week until it started feeling better.  He avoided activity with that side.  As he is retired, he reports any limitations to day-to-day functioning.  Patient reports that his shoulder has improved about 90% since last week.  He has been taking 1-2 Tylenol daily and took some this morning.  He did not do any exercises.   PERTINENT  PMH / PSH: s/p R rotator cuff repair, Tendon repair in left arm, B/L carpal tunnel release (2006)  OBJECTIVE:  BP 132/64   Ht 5' 6"  (1.676 m)   Wt 170 lb (77.1 kg)   BMI 27.44 kg/m   Shoulder Exam:  Inspection: No gross abnormalities (skin, scars, symmetry, swelling, atrophy, hypertrophy, scapular winging) Palpation: TTP to deltoid and coracoid process. Otherwise, no TTP to other body prominences or major muscle groups.  ROM: C Spine FROM. Shoulder FROM.  Strength: 5/5 in bilateral UE. No pain to resistance of inter/external rotation with arms at side. Mild pain to deltoid with resistacne to forward elevation. Mild pain with Empty can.   ASSESSMENT/PLAN:   Shoulder pain  Phsyical exam & history reassuring today and no evidence of rotator cuff tearing. Continue conservative management PRN pain. No limitations to activity. If no further improvement, can do PT (already seeing for right hip pain).    Wilber Oliphant, MD [facility] PGY 3

## 2021-05-18 ENCOUNTER — Encounter: Payer: Self-pay | Admitting: Family Medicine

## 2021-05-18 ENCOUNTER — Other Ambulatory Visit: Payer: Self-pay

## 2021-05-18 ENCOUNTER — Ambulatory Visit (INDEPENDENT_AMBULATORY_CARE_PROVIDER_SITE_OTHER): Payer: Medicare Other | Admitting: Family Medicine

## 2021-05-18 VITALS — BP 140/72 | Ht 66.0 in | Wt 160.0 lb

## 2021-05-18 DIAGNOSIS — S43014A Anterior dislocation of right humerus, initial encounter: Secondary | ICD-10-CM | POA: Diagnosis not present

## 2021-05-18 NOTE — Progress Notes (Signed)
PCP: Leanna Battles, MD  Subjective:   HPI: Patient is a 75 y.o. male here for right shoulder injury.  Patient reports Saturday he was coming back from the bathroom, felt 'woozy' and fell down causing injury to right shoulder and right scalp. No chest pain, shortness of breath prior to this. Imaging without intracranial abnormality. He suffered anterior shoulder dislocation that was reduced. This is same shoulder he had rotator cuff repair on remotely. Difficulty trying to lift arm overhead though can do so by lifting with his left arm. Given sling but not using currently.  Past Medical History:  Diagnosis Date  . Anxiety   . Arthritis   . Clotting disorder ([place])   . 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 Depression   . DM (diabetes mellitus) ([place])   . GERD (gastroesophageal reflux disease)   . Hiatal hernia   . History of shingles 04/03/2014  . HTN (hypertension)   . Hyperlipidemia   . Insomnia    resolved by using CPAP  . Iron deficiency anemia due to chronic blood loss 11/20/2016  . Macular degeneration   . Nocturia more than twice per night 11/11/2014   For 6 month, 5 nocturias a night.   . Obesity   . 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 Portal vein thrombosis   . Rhinitis   . Situational depression   . Sleep apnea    uses CPAP every night  . Tubular adenoma 12/10/2015   6 cecum polyps    Current Outpatient Medications on File Prior to Visit  Medication Sig Dispense Refill  . Alum Hydroxide-Mag Carbonate (GAVISCON PO) Take by mouth.    [name] amLODipine (NORVASC) 5 MG tablet Take 5 mg by mouth 2 (two) times daily.     [name] BENZONATATE PO Take by mouth as needed for cough.    [name] buPROPion (WELLBUTRIN XL) 300 MG 24 hr tablet Take 300 mg by mouth every morning.    . Calcium Carbonate Antacid (TUMS PO) Take by mouth.    . CEPHALEXIN PO Take by mouth. Prior to dental procedure (Patient not taking: Reported on 01/27/2021)    . CVS B-12 500 MCG SUBL Place 2  tablets under the tongue daily with breakfast.  (Patient not taking: Reported on 01/27/2021)  12  . Cyanocobalamin (CVS B-12 SL) Place 500 mg under the tongue daily.    . dasatinib (SPRYCEL) 70 MG tablet TAKE 1 TABLET (70 MG TOTAL) BY MOUTH DAILY. 30 tablet 4  . desmopressin (DDAVP) 0.2 MG tablet Take 0.4 mg by mouth 2 (two) times daily.     . fluticasone (FLONASE) 50 MCG/ACT nasal spray Place 1 spray into both nostrils 2 (two) times daily.    [name] glucose blood test strip OneTouch Ultra Blue Test Strip  USE TO TEST BLOOD SUGAR ONCE DAILY    . insulin glargine (LANTUS) 100 UNIT/ML injection Inject 100 Units into the skin daily.    . Lidocaine (HM LIDOCAINE PATCH) 4 % PTCH Apply topically daily as needed.    [name] LORazepam (ATIVAN) 0.5 MG tablet Take 0.5 mg by mouth daily.   3  . metFORMIN (GLUCOPHAGE-XR) 500 MG 24 hr tablet Take 500 mg by mouth 2 (two) times daily.    [name] moxifloxacin (VIGAMOX) 0.5 % ophthalmic solution PLACE 1 DROP IN RIGHT EYE FOUR TIMES A DAY USE THE DAY BEFORE, DAY OF, AND DAY AFTER TREATMENT.  5  . pantoprazole (PROTONIX) 40 MG tablet Take 40 mg by mouth 2 (  two) times daily.    . polyethylene glycol (MIRALAX / GLYCOLAX) 17 g packet Take 17 g by mouth daily as needed.    . prednisoLONE acetate (PRED FORTE) 1 % ophthalmic suspension SMARTSIG:In Eye(s)    . PRESCRIPTION MEDICATION every 8 (eight) weeks. Gets injections in right eye at dr's office every 8 weeks     . rosuvastatin (CRESTOR) 20 MG tablet Take 20 mg by mouth at bedtime.    [name] testosterone cypionate (DEPOTESTOSTERONE CYPIONATE) 200 MG/ML injection Inject 150 mg into the muscle every 21 ( twenty-one) days.     . TRULICITY 1.5 TO/6.7TI SOPN SMARTSIG:0.5 Milliliter(s) SUB-Q Once a Week    . VIAGRA 100 MG tablet Take 100 mg by mouth daily as needed for erectile dysfunction.   1  . XARELTO 10 MG TABS tablet TAKE 1 TABLET BY MOUTH EVERY DAY 30 tablet 11  . zolpidem (AMBIEN) 10 MG tablet Take 10 mg by mouth at bedtime as needed.      No current facility-administered medications on file prior to visit.    Past Surgical History:  Procedure Laterality Date  . CARDIAC CATHETERIZATION     greater 10 yrs ago, normal  . CARPAL TUNNEL RELEASE     bilateral  . COLONOSCOPY  06/2005   [facility] Dr Lajoyce [place]  . ESOPHAGOGASTRODUODENOSCOPY (EGD) WITH PROPOFOL N/A 09/28/2016   Procedure: ESOPHAGOGASTRODUODENOSCOPY (EGD) WITH PROPOFOL;  Surgeon: Gatha Mayer, MD;  Location: [facility];  Service: Endoscopy;  Laterality: N/A;  . IR GENERIC HISTORICAL  12/12/2016   IR US GUIDE VASC ACCESS RIGHT 12/12/2016 [name], MD [facility]  . IR GENERIC HISTORICAL  12/12/2016   IR VENOGRAM HEPATIC W HEMODYNAMIC EVALUATION 12/12/2016 [name], MD [facility]  . IR GENERIC HISTORICAL  12/12/2016   IR TRANSCATHETER BX 12/12/2016 [name], MD [facility]  . ROTATOR CUFF REPAIR     right  . TENDON REPAIR     left arm  . TONSILLECTOMY    . TOTAL KNEE ARTHROPLASTY Right 2010  . WISDOM TOOTH EXTRACTION      Allergies  Allergen Reactions  . Ibuprofen Anaphylaxis and Other (See Comments)    Upper GI Bled  . Nsaids Other (See Comments)    Social History   Socioeconomic History  . Marital status: Married    Spouse name: Not on file  . Number of children: 2  . Years of education: Not on file  . Highest education level: Not on file  Occupational History  . Occupation: retired  Tobacco Use  . Smoking status: Never Smoker  . Smokeless tobacco: Never Used  . Tobacco comment: never used tobacco  Vaping Use  . Vaping Use: Never used  Substance and Sexual Activity  . Alcohol use: Yes    Alcohol/week: 0.0 standard drinks  . Drug use: No  . Sexual activity: Not on file  Other Topics Concern  . Not on file  Social History Narrative   Right handed.  Caffeine 3 cups daily,  Married 2 kids (one deceased).  College grad.  [employer].    Social Determinants of Health   Financial Resource  Strain: Not on file  Food Insecurity: Not on file  Transportation Needs: Not on file  Physical Activity: Not on file  Stress: Not on file  Social Connections: Not on file  Intimate Partner Violence: Not on file    Family History  Problem Relation Age of Onset  . Bipolar disorder Son  committed suicide  . Colon cancer Other   . Heart failure Other   . Heart attack Other   . Lung disease Father        history  . Diabetes Mother        history  . Stroke Mother        history  . Colon polyps Neg Hx   . Rectal cancer Neg Hx   . Stomach cancer Neg Hx   . Esophageal cancer Neg Hx     BP 140/72   Ht 5' 6"  (1.676 m)   Wt 160 lb (72.6 kg)   BMI 25.82 kg/m   No flowsheet data found.  No flowsheet data found.  Review of Systems: See HPI above.     Objective:  Physical Exam:  Gen: NAD, comfortable in exam room  Right periorbital hematoma and right scalp hematoma noted.  Right shoulder: No swelling, ecchymoses.  No gross deformity. No TTP. Full passive ROM.  Active limitation of 30 degrees flexion, abduction, external rotation. NV intact distally.   Assessment & Plan:  1. Right shoulder injury - 2/2 shoulder dislocation.  Concern he may have concurrent rotator cuff tear.  Discussed this possibility and he stated he would not want repair if this was the case but advised to think about this further.  Will start motion exercises.  Icing, tylenol.  Sling only if needed.  F/u in 2 weeks.

## 2021-05-18 NOTE — Patient Instructions (Signed)
Focus on the motion exercises for your shoulder. Ok to continue with physical therapy but no strengthening of this right shoulder yet. Icing 15 minutes at a time 3-4 times a day. Only use the sling if you really have to. Follow up with me in 2 weeks - if not improving we will plan to do an ultrasound of this shoulder. If the rotator cuff is torn again think about if you would want to pursue rotator cuff repair as we discussed.

## 2021-05-24 ENCOUNTER — Ambulatory Visit (INDEPENDENT_AMBULATORY_CARE_PROVIDER_SITE_OTHER): Payer: Medicare Other | Admitting: Adult Health

## 2021-05-24 ENCOUNTER — Encounter: Payer: Self-pay | Admitting: Adult Health

## 2021-05-24 VITALS — BP 139/68 | HR 76 | Ht 66.0 in | Wt 177.0 lb

## 2021-05-24 DIAGNOSIS — G4733 Obstructive sleep apnea (adult) (pediatric): Secondary | ICD-10-CM

## 2021-05-24 DIAGNOSIS — Z9989 Dependence on other enabling machines and devices: Secondary | ICD-10-CM

## 2021-05-24 NOTE — Patient Instructions (Signed)
Continue using CPAP nightly and greater than 4 hours each night °If your symptoms worsen or you develop new symptoms please let us know.  ° °

## 2021-05-24 NOTE — Progress Notes (Signed)
PATIENT: James [name]. DOB: 1946-07-29  REASON FOR VISIT: follow up HISTORY FROM: patient Primary neurologist: Dr. Brett Fairy  HISTORY OF PRESENT ILLNESS: Today 05/24/21:  James [name] is a 75 year old male with a history of OSA on CPAP. He returns today for follow-up.  He states that since his last visit he has been diagnosed with lymphoma.  He takes a daily oral medication for this.  He states that he has had several falls.  Most recently was on Saturday and he dislocated his right shoulder.  He states that his PCP has adjusted some of his medications that he felt was causing dizziness.  He reports that the CPAP is working well.  He states that he has intentionally lost 60 pounds.  He wants to lose approximately 10 more pounds.  He denies any issues.  04/19/20: James [name] is a 75 year old male with a history of obstructive sleep apnea on CPAP.  He returns today for follow-up.  His download indicates that he use his machine nightly for compliance of 100%.  He uses machine greater than 4 hours each night.  On average he uses his machine 7 hours and 29 minutes.  His residual AHI is 2.2 on 5 to 12 cm of water with EPR 3.  Leak in the 95th percentile was 14.9 L/min.  He returns today for evaluation.  HISTORY 02/16/20   James [name] is a 75 year old male with a history of obstructive sleep apnea on CPAP.  He returns today for follow-up.  His download indicates that he use his machine 28 out of 30 days for compliance of 93%.  He uses machine greater than 4 hours 19 days for compliance of 63%.  On average he uses his machine 4 hours and 58 minutes.  His residual AHI is 1.3 on 5 to 12 cm of water with EPR 3.  Leak in the 95th percentile is 15 L/min.  He recently got a new machine on the 17th.  Reports that he had to set it up himself.  We are unable to get this data.  We will recheck the DME company today.  REVIEW OF SYSTEMS: Out of a complete 14 system review of symptoms, the patient complains only of the  following symptoms, and all other reviewed systems are negative.  ESS 6 FSS 25  ALLERGIES: Allergies  Allergen Reactions  . Ibuprofen Anaphylaxis and Other (See Comments)    Upper GI Bled  . Nsaids Other (See Comments)    HOME MEDICATIONS: Outpatient Medications Prior to Visit  Medication Sig Dispense Refill  . Alum Hydroxide-Mag Carbonate (GAVISCON PO) Take by mouth.    [name] amLODipine (NORVASC) 5 MG tablet Take 5 mg by mouth 2 (two) times daily.     [name] BENZONATATE PO Take by mouth as needed for cough.    [name] buPROPion (WELLBUTRIN XL) 300 MG 24 hr tablet Take 300 mg by mouth every morning.    . Calcium Carbonate Antacid (TUMS PO) Take by mouth.    . CEPHALEXIN PO Take by mouth. Prior to dental procedure (Patient not taking: Reported on 01/27/2021)    . CVS B-12 500 MCG SUBL Place 2 tablets under the tongue daily with breakfast.  (Patient not taking: Reported on 01/27/2021)  12  . Cyanocobalamin (CVS B-12 SL) Place 500 mg under the tongue daily.    . dasatinib (SPRYCEL) 70 MG tablet TAKE 1 TABLET (70 MG TOTAL) BY MOUTH DAILY. 30 tablet 4  . desmopressin (DDAVP) 0.2 MG tablet  Take 0.4 mg by mouth 2 (two) times daily.     . fluticasone (FLONASE) 50 MCG/ACT nasal spray Place 1 spray into both nostrils 2 (two) times daily.    [name] glucose blood test strip OneTouch Ultra Blue Test Strip  USE TO TEST BLOOD SUGAR ONCE DAILY    . insulin glargine (LANTUS) 100 UNIT/ML injection Inject 100 Units into the skin daily.    . Lidocaine (HM LIDOCAINE PATCH) 4 % PTCH Apply topically daily as needed.    [name] LORazepam (ATIVAN) 0.5 MG tablet Take 0.5 mg by mouth daily.   3  . metFORMIN (GLUCOPHAGE-XR) 500 MG 24 hr tablet Take 500 mg by mouth 2 (two) times daily.    [name] moxifloxacin (VIGAMOX) 0.5 % ophthalmic solution PLACE 1 DROP IN RIGHT EYE FOUR TIMES A DAY USE THE DAY BEFORE, DAY OF, AND DAY AFTER TREATMENT.  5  . pantoprazole (PROTONIX) 40 MG tablet Take 40 mg by mouth 2 (two) times daily.    . polyethylene  glycol (MIRALAX / GLYCOLAX) 17 g packet Take 17 g by mouth daily as needed.    . prednisoLONE acetate (PRED FORTE) 1 % ophthalmic suspension SMARTSIG:In Eye(s)    . PRESCRIPTION MEDICATION every 8 (eight) weeks. Gets injections in right eye at dr's office every 8 weeks     . rosuvastatin (CRESTOR) 20 MG tablet Take 20 mg by mouth at bedtime.    [name] testosterone cypionate (DEPOTESTOSTERONE CYPIONATE) 200 MG/ML injection Inject 150 mg into the muscle every 21 ( twenty-one) days.     . TRULICITY 1.5 TD/9.7CB SOPN SMARTSIG:0.5 Milliliter(s) SUB-Q Once a Week    . VIAGRA 100 MG tablet Take 100 mg by mouth daily as needed for erectile dysfunction.   1  . XARELTO 10 MG TABS tablet TAKE 1 TABLET BY MOUTH EVERY DAY 30 tablet 11  . zolpidem (AMBIEN) 10 MG tablet Take 10 mg by mouth at bedtime as needed.     No facility-administered medications prior to visit.    PAST MEDICAL HISTORY: Past Medical History:  Diagnosis Date  . Anxiety   . Arthritis   . Clotting disorder ([place])   . 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 Depression   . DM (diabetes mellitus) ([place])   . GERD (gastroesophageal reflux disease)   . Hiatal hernia   . History of shingles 04/03/2014  . HTN (hypertension)   . Hyperlipidemia   . Insomnia    resolved by using CPAP  . Iron deficiency anemia due to chronic blood loss 11/20/2016  . Macular degeneration   . Nocturia more than twice per night 11/11/2014   For 6 month, 5 nocturias a night.   . Obesity   . 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 Portal vein thrombosis   . Rhinitis   . Situational depression   . Sleep apnea    uses CPAP every night  . Tubular adenoma 12/10/2015   6 cecum polyps    PAST SURGICAL HISTORY: Past Surgical History:  Procedure Laterality Date  . CARDIAC CATHETERIZATION     greater 10 yrs ago, normal  . CARPAL TUNNEL RELEASE     bilateral  . COLONOSCOPY  06/2005   [facility] Dr Lajoyce [place]ers  . ESOPHAGOGASTRODUODENOSCOPY (EGD) WITH  PROPOFOL N/A 09/28/2016   Procedure: ESOPHAGOGASTRODUODENOSCOPY (EGD) WITH PROPOFOL;  Surgeon: Gatha Mayer, MD;  Location: [facility];  Service: Endoscopy;  Laterality: N/A;  . IR GENERIC HISTORICAL  12/12/2016   IR US GUIDE VASC  ACCESS RIGHT 12/12/2016 [name], MD [facility]  . IR GENERIC HISTORICAL  12/12/2016   IR VENOGRAM HEPATIC W HEMODYNAMIC EVALUATION 12/12/2016 [name], MD [facility]  . IR GENERIC HISTORICAL  12/12/2016   IR TRANSCATHETER BX 12/12/2016 [name], MD [facility]  . ROTATOR CUFF REPAIR     right  . TENDON REPAIR     left arm  . TONSILLECTOMY    . TOTAL KNEE ARTHROPLASTY Right 2010  . WISDOM TOOTH EXTRACTION      FAMILY HISTORY: Family History  Problem Relation Age of Onset  . Bipolar disorder Son        committed suicide  . Colon cancer Other   . Heart failure Other   . Heart attack Other   . Lung disease Father        history  . Diabetes Mother        history  . Stroke Mother        history  . Colon polyps Neg Hx   . Rectal cancer Neg Hx   . Stomach cancer Neg Hx   . Esophageal cancer Neg Hx     SOCIAL HISTORY: Social History   Socioeconomic History  . Marital status: Married    Spouse name: Not on file  . Number of children: 2  . Years of education: Not on file  . Highest education level: Not on file  Occupational History  . Occupation: retired  Tobacco Use  . Smoking status: Never Smoker  . Smokeless tobacco: Never Used  . Tobacco comment: never used tobacco  Vaping Use  . Vaping Use: Never used  Substance and Sexual Activity  . Alcohol use: Yes    Alcohol/week: 0.0 standard drinks  . Drug use: No  . Sexual activity: Not on file  Other Topics Concern  . Not on file  Social History Narrative   Right handed.  Caffeine 3 cups daily,  Married 2 kids (one deceased).  College grad.  [employer].    Social Determinants of Health   Financial Resource Strain: Not on file  Food  Insecurity: Not on file  Transportation Needs: Not on file  Physical Activity: Not on file  Stress: Not on file  Social Connections: Not on file  Intimate Partner Violence: Not on file      PHYSICAL EXAM  Vitals:   05/24/21 0802  BP: 139/68  Pulse: 76  Weight: 177 lb (80.3 kg)  Height: 5' 6"  (1.676 m)   Body mass index is 28.57 kg/m.  Generalized: Well developed, in no acute distress  Chest: Lungs clear to auscultation bilaterally  Neurological examination  Mentation: Alert oriented to time, place, history taking. Follows all commands speech and language fluent Cranial nerve II-XII: Extraocular movements were full, visual field were full on confrontational test Head turning and shoulder shrug  were normal and symmetric. Motor: The motor testing reveals 5 over 5 strength of all 4 extremities. Good symmetric motor tone is noted throughout.  Sensory: Sensory testing is intact to soft touch on all 4 extremities. No evidence of extinction is noted.  Gait and station: Gait is normal.    DIAGNOSTIC DATA (LABS, IMAGING, TESTING) - I reviewed patient records, labs, notes, testing and imaging myself where available.  Lab Results  Component Value Date   WBC 9.6 04/07/2021   HGB 12.4 (L) 04/07/2021   HCT 39.0 04/07/2021   MCV 92.0 04/07/2021   PLT 195 04/07/2021      Component  Value Date/Time   NA 136 04/07/2021 1620   NA 137 10/29/2017 1307   NA 134 (L) 05/10/2017 0755   K 4.3 04/07/2021 1620   K 3.6 10/29/2017 1307   K 4.2 05/10/2017 0755   CL 108 04/07/2021 1620   CL 101 10/29/2017 1307   CO2 22 04/07/2021 1620   CO2 25 10/29/2017 1307   CO2 20 (L) 05/10/2017 0755   GLUCOSE 113 (H) 04/07/2021 1620   GLUCOSE 200 (H) 10/29/2017 1307   BUN 12 04/07/2021 1620   BUN 14 10/29/2017 1307   BUN 21.0 05/10/2017 0755   CREATININE 0.96 04/07/2021 1620   CREATININE 1.23 03/24/2021 1047   CREATININE 1.4 (H) 10/29/2017 1307   CREATININE 1.3 05/10/2017 0755   CALCIUM 9.6  04/07/2021 1620   CALCIUM 10.3 10/29/2017 1307   CALCIUM 10.3 05/10/2017 0755   PROT 7.4 03/24/2021 1047   PROT 6.5 10/29/2017 1307   PROT 6.9 05/10/2017 0755   ALBUMIN 4.5 03/24/2021 1047   ALBUMIN 3.6 10/29/2017 1307   ALBUMIN 3.9 05/10/2017 0755   AST 28 03/24/2021 1047   AST 25 05/10/2017 0755   ALT 20 03/24/2021 1047   ALT 27 10/29/2017 1307   ALT 26 05/10/2017 0755   ALKPHOS 113 03/24/2021 1047   ALKPHOS 77 10/29/2017 1307   ALKPHOS 97 05/10/2017 0755   BILITOT 0.6 03/24/2021 1047   BILITOT 0.70 05/10/2017 0755   GFRNONAA >60 04/07/2021 1620   GFRNONAA >60 03/24/2021 1047   GFRAA 53 (L) 09/02/2020 0900      ASSESSMENT AND PLAN 75 y.o. year old male  has a past medical history of Anxiety, Arthritis, Clotting disorder ([place]), CML (chronic myelocytic leukemia) ([place]) (12/16/2020), Depression, DM (diabetes mellitus) ([place]), GERD (gastroesophageal reflux disease), Hiatal hernia, History of shingles (04/03/2014), HTN (hypertension), Hyperlipidemia, Insomnia, Iron deficiency anemia due to chronic blood loss (11/20/2016), Macular degeneration, Nocturia more than twice per night (11/11/2014), Obesity, Polycythemia vera(238.4), Portal vein thrombosis, Rhinitis, Situational depression, Sleep apnea, and Tubular adenoma (12/10/2015). here with:  1. OSA on CPAP  - CPAP compliance excellent - Good treatment of AHI  - Encourage patient to use CPAP nightly and > 4 hours each night -With his weight loss we can consider repeating home sleep test in the future if the patient desires - F/U in 1 year or sooner if needed    Ward Givens, MSN, NP-C 05/24/2021, 7:56 AM [facility] [address], [place], [place] [address] [phone]

## 2021-05-26 ENCOUNTER — Other Ambulatory Visit: Payer: Self-pay

## 2021-05-26 ENCOUNTER — Inpatient Hospital Stay: Payer: Medicare Other | Attending: Hematology & Oncology

## 2021-05-26 ENCOUNTER — Encounter: Payer: Self-pay | Admitting: Hematology & Oncology

## 2021-05-26 ENCOUNTER — Inpatient Hospital Stay (HOSPITAL_BASED_OUTPATIENT_CLINIC_OR_DEPARTMENT_OTHER): Payer: Medicare Other | Admitting: Hematology & Oncology

## 2021-05-26 VITALS — BP 130/65 | HR 79 | Temp 98.0°F | Resp 20 | Wt 175.4 lb

## 2021-05-26 DIAGNOSIS — D751 Secondary polycythemia: Secondary | ICD-10-CM | POA: Diagnosis not present

## 2021-05-26 DIAGNOSIS — Z9181 History of falling: Secondary | ICD-10-CM | POA: Insufficient documentation

## 2021-05-26 DIAGNOSIS — C9210 Chronic myeloid leukemia, BCR/ABL-positive, not having achieved remission: Secondary | ICD-10-CM | POA: Diagnosis present

## 2021-05-26 DIAGNOSIS — Z7901 Long term (current) use of anticoagulants: Secondary | ICD-10-CM | POA: Diagnosis not present

## 2021-05-26 DIAGNOSIS — D500 Iron deficiency anemia secondary to blood loss (chronic): Secondary | ICD-10-CM

## 2021-05-26 LAB — CBC WITH DIFFERENTIAL (CANCER CENTER ONLY)
Abs Immature Granulocytes: 0.02 10*3/uL (ref 0.00–0.07)
Basophils Absolute: 0 10*3/uL (ref 0.0–0.1)
Basophils Relative: 0 %
Eosinophils Absolute: 0.2 10*3/uL (ref 0.0–0.5)
Eosinophils Relative: 5 %
HCT: 42.8 % (ref 39.0–52.0)
Hemoglobin: 13.6 g/dL (ref 13.0–17.0)
Immature Granulocytes: 0 %
Lymphocytes Relative: 11 %
Lymphs Abs: 0.5 10*3/uL — ABNORMAL LOW (ref 0.7–4.0)
MCH: 30.7 pg (ref 26.0–34.0)
MCHC: 31.8 g/dL (ref 30.0–36.0)
MCV: 96.6 fL (ref 80.0–100.0)
Monocytes Absolute: 0.7 10*3/uL (ref 0.1–1.0)
Monocytes Relative: 13 %
Neutro Abs: 3.6 10*3/uL (ref 1.7–7.7)
Neutrophils Relative %: 71 %
Platelet Count: 272 10*3/uL (ref 150–400)
RBC: 4.43 MIL/uL (ref 4.22–5.81)
RDW: 18.4 % — ABNORMAL HIGH (ref 11.5–15.5)
WBC Count: 5.2 10*3/uL (ref 4.0–10.5)
nRBC: 0 % (ref 0.0–0.2)

## 2021-05-26 LAB — CMP (CANCER CENTER ONLY)
ALT: 14 U/L (ref 0–44)
AST: 19 U/L (ref 15–41)
Albumin: 4.3 g/dL (ref 3.5–5.0)
Alkaline Phosphatase: 82 U/L (ref 38–126)
Anion gap: 8 (ref 5–15)
BUN: 14 mg/dL (ref 8–23)
CO2: 27 mmol/L (ref 22–32)
Calcium: 10.7 mg/dL — ABNORMAL HIGH (ref 8.9–10.3)
Chloride: 104 mmol/L (ref 98–111)
Creatinine: 1.02 mg/dL (ref 0.61–1.24)
GFR, Estimated: >60 mL/min (ref >60)
Glucose, Bld: 109 mg/dL — ABNORMAL HIGH (ref 70–99)
Potassium: 4.2 mmol/L (ref 3.5–5.1)
Sodium: 139 mmol/L (ref 135–145)
Total Bilirubin: 0.7 mg/dL (ref 0.3–1.2)
Total Protein: 6.7 g/dL (ref 6.5–8.1)

## 2021-05-26 LAB — IRON AND TIBC
Iron: 40 ug/dL — ABNORMAL LOW (ref 42–163)
Saturation Ratios: 11 % — ABNORMAL LOW (ref 20–55)
TIBC: 356 ug/dL (ref 202–409)
UIBC: 316 ug/dL (ref 117–376)

## 2021-05-26 LAB — LACTATE DEHYDROGENASE: LDH: 226 U/L — ABNORMAL HIGH (ref 98–192)

## 2021-05-26 LAB — SAVE SMEAR(SSMR), FOR PROVIDER SLIDE REVIEW

## 2021-05-26 LAB — FERRITIN: Ferritin: 76 ng/mL (ref 24–336)

## 2021-05-26 NOTE — Progress Notes (Signed)
Hematology and Oncology Follow Up Visit  Jevante Hollibaugh [id number] 1946/10/09 75 y.o. 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 IV iron as indicated - last received in March 2022        Interim History:  Mr. Hreha is here today for follow-up.  Unfortunately, he fell Memorial Day weekend.  Looks like he dislocated his right shoulder.  He may need to have surgery for this.  I think he sees a Doctor, general practice for this soon.  The Sprycel is worked incredibly well for him.  So far, he has responded very nicely.  His last BCR/ABL was down to 0.23%.  I would like to believe that he is in a major molecular remission right now.  He has had no problems with cough or shortness of breath.  There is been no fluid retention.  Previous had no nausea or vomiting.  He has had no problems bleeding with the Xarelto.  Thankfully when he fell he had no problems with bleeding.  He did have a CT of the head which did not show any bleed.  He has had no problems with fever.  He has had no rashes.  Overall, his performance status is ECOG 2.       Medications:  Allergies as of 05/26/2021       Reactions   Ibuprofen Anaphylaxis, Other (See Comments)   Upper GI Bled   Nsaids Other (See Comments)        Medication List        Accurate as of May 26, 2021  8:09 AM. If you have any questions, ask your nurse or doctor.          STOP taking these medications    GAVISCON PO Stopped by: [name], MD   insulin glargine 100 UNIT/ML injection Commonly known as: LANTUS Stopped by: [name], MD       TAKE these medications    amLODipine 5 MG tablet Commonly known as: NORVASC Take 5 mg by mouth 2 (two) times daily.   BENZONATATE PO Take by  mouth as needed for cough.   buPROPion 300 MG 24 hr tablet Commonly known as: WELLBUTRIN XL Take 300 mg by mouth every morning.   CEPHALEXIN PO Take by mouth. Prior to dental procedure   CVS B-12 SL Place 1,000 mg under the tongue daily.   CVS B-12 500 MCG Subl Generic drug: Cyanocobalamin Place 2 tablets under the tongue daily with breakfast.   desmopressin 0.2 MG tablet Commonly known as: DDAVP Take 0.4 mg by mouth 2 (two) times daily.   fluticasone 50 MCG/ACT nasal spray Commonly known as: FLONASE Place 1 spray into both nostrils 2 (two) times daily.   glucose blood test strip OneTouch Ultra Blue Test Strip  USE TO TEST BLOOD SUGAR ONCE DAILY   Lidocaine 4 % Ptch Apply topically daily as needed.   LORazepam 0.5 MG tablet Commonly known as: ATIVAN Take 0.5 mg by mouth daily.   metFORMIN 500 MG 24 hr tablet Commonly known as: GLUCOPHAGE-XR Take 500 mg by mouth 2 (two) times daily.   moxifloxacin 0.5 % ophthalmic solution Commonly known as: VIGAMOX PLACE 1 DROP IN RIGHT EYE FOUR TIMES A DAY USE THE DAY BEFORE, DAY  OF, AND DAY AFTER TREATMENT.   pantoprazole 40 MG tablet Commonly known as: PROTONIX Take 40 mg by mouth 2 (two) times daily.   polyethylene glycol 17 g packet Commonly known as: MIRALAX / GLYCOLAX Take 17 g by mouth daily as needed.   prednisoLONE acetate 1 % ophthalmic suspension Commonly known as: PRED FORTE SMARTSIG:In Eye(s)   PRESCRIPTION MEDICATION every 8 (eight) weeks. Gets injections in right eye at dr's office every 8 weeks   rosuvastatin 20 MG tablet Commonly known as: CRESTOR Take 20 mg by mouth at bedtime.   Sprycel 70 MG tablet Generic drug: dasatinib TAKE 1 TABLET (70 MG TOTAL) BY MOUTH DAILY.   testosterone cypionate 200 MG/ML injection Commonly known as: DEPOTESTOSTERONE CYPIONATE Inject 150 mg into the muscle every 21 ( twenty-one) days.   Trulicity 1.5 HQ/4.6NG Sopn Generic drug: Dulaglutide SMARTSIG:0.5  Milliliter(s) SUB-Q Once a Week   Viagra 100 MG tablet Generic drug: sildenafil Take 100 mg by mouth daily as needed for erectile dysfunction.   Xarelto 10 MG Tabs tablet Generic drug: rivaroxaban TAKE 1 TABLET BY MOUTH EVERY DAY   zolpidem 10 MG tablet Commonly known as: AMBIEN Take 10 mg by mouth at bedtime as needed.        Allergies:  Allergies  Allergen Reactions   Ibuprofen Anaphylaxis and Other (See Comments)    Upper GI Bled   Nsaids Other (See Comments)    Past Medical History, Surgical history, Social history, and Family History were reviewed and updated.  Review of Systems: Review of Systems  Constitutional: Negative.   HENT: Negative.    Eyes: Negative.   Respiratory: Negative.    Cardiovascular: Negative.   Gastrointestinal: Negative.   Genitourinary: Negative.   Musculoskeletal: Negative.   Skin: Negative.   Neurological: Negative.   Endo/Heme/Allergies: Negative.   Psychiatric/Behavioral: Negative.     Physical Exam:  vitals were not taken for this visit.   Wt Readings from Last 3 Encounters:  05/24/21 80.3 kg  05/18/21 72.6 kg  05/02/21 77.1 kg    Physical Exam Vitals reviewed.  HENT:     Head: Normocephalic and atraumatic.  Eyes:     Pupils: Pupils are equal, round, and reactive to light.  Cardiovascular:     Rate and Rhythm: Normal rate and regular rhythm.     Heart sounds: Normal heart sounds.  Pulmonary:     Effort: Pulmonary effort is normal.     Breath sounds: Normal breath sounds.  Abdominal:     General: Bowel sounds are normal.     Palpations: Abdomen is soft.  Musculoskeletal:        General: No tenderness or deformity. Normal range of motion.     Cervical back: Normal range of motion.  Lymphadenopathy:     Cervical: No cervical adenopathy.  Skin:    General: Skin is warm and dry.     Findings: No erythema or rash.  Neurological:     Mental Status: He is alert and oriented to person, place, and time.  Psychiatric:         Behavior: Behavior normal.        Thought Content: Thought content normal.        Judgment: Judgment normal.     Lab Results  Component Value Date   WBC 5.2 05/26/2021   HGB 13.6 05/26/2021   HCT 42.8 05/26/2021   MCV 96.6 05/26/2021   PLT 272 05/26/2021   Lab Results  Component Value Date   FERRITIN  191 03/24/2021   IRON 108 03/24/2021   TIBC 353 03/24/2021   UIBC 244 03/24/2021   IRONPCTSAT 31 03/24/2021   Lab Results  Component Value Date   RETICCTPCT 0.8 03/24/2021   RBC 4.43 05/26/2021   RETICCTABS 94.0 08/30/2011   No results found for: Nils Pyle [facility] Lab Results  Component Value Date   IGGSERUM 828 10/11/2016   IGA 177 06/22/2016   No results found for: Odetta Pink, SPEI   Chemistry      Component Value Date/Time   NA 136 04/07/2021 1620   NA 137 10/29/2017 1307   NA 134 (L) 05/10/2017 0755   K 4.3 04/07/2021 1620   K 3.6 10/29/2017 1307   K 4.2 05/10/2017 0755   CL 108 04/07/2021 1620   CL 101 10/29/2017 1307   CO2 22 04/07/2021 1620   CO2 25 10/29/2017 1307   CO2 20 (L) 05/10/2017 0755   BUN 12 04/07/2021 1620   BUN 14 10/29/2017 1307   BUN 21.0 05/10/2017 0755   CREATININE 0.96 04/07/2021 1620   CREATININE 1.23 03/24/2021 1047   CREATININE 1.4 (H) 10/29/2017 1307   CREATININE 1.3 05/10/2017 0755      Component Value Date/Time   CALCIUM 9.6 04/07/2021 1620   CALCIUM 10.3 10/29/2017 1307   CALCIUM 10.3 05/10/2017 0755   ALKPHOS 113 03/24/2021 1047   ALKPHOS 77 10/29/2017 1307   ALKPHOS 97 05/10/2017 0755   AST 28 03/24/2021 1047   AST 25 05/10/2017 0755   ALT 20 03/24/2021 1047   ALT 27 10/29/2017 1307   ALT 26 05/10/2017 0755   BILITOT 0.6 03/24/2021 1047   BILITOT 0.70 05/10/2017 0755     Impression and Plan: Mr. Mathews is 75 year old gentleman with a long history of polycythemia. He is JAK2 negative.  He has transformed over to [facility].  He is on Sprycel.   He is responding quite nicely.  Again, I had believe he is in a major molecular remission.  I feel bad that he fell.  If he needs surgery for the right shoulder, I do not see a problem with him having this.  I realize he is on Xarelto.  I really do not think that iron is can be a problem for him.  Last summer he saw him, his ferritin was 191 with iron saturation of 31%.  We will go ahead and plan to get him back after Labor Day now.  I think we can get him back in 3 months.  Again, if he needs surgery for the shoulder, I am sure the Orthopedic Surgeon will be in contact with this.    [name], MD 6/9/20228:09 AM

## 2021-05-27 ENCOUNTER — Telehealth: Payer: Self-pay | Admitting: *Deleted

## 2021-05-27 NOTE — Telephone Encounter (Signed)
-----   Message from [name], MD sent at 05/26/2021  1:27 PM EDT ----- Please call him and tell him that the iron level is actually on the low side.  However, he does not need to have any IV iron given.  Thanks.  Laurey Arrow

## 2021-05-27 NOTE — Telephone Encounter (Signed)
Notified pt of lab results, pt verbalized he has been losing his balance, feeling like he is going to fall at times with standing or walking. Pt advised he has lost over 70lbs in last year and still taking same BP medication. Instructed pt to call PCP to discuss this concern and ask about BP and medication. Pt verbalized understanding. No further concerns.

## 2021-05-28 ENCOUNTER — Emergency Department (HOSPITAL_BASED_OUTPATIENT_CLINIC_OR_DEPARTMENT_OTHER)
Admission: EM | Admit: 2021-05-28 | Discharge: 2021-05-28 | Disposition: A | Discharge status: Home / Self Care | Payer: Medicare Other | Attending: Emergency Medicine | Admitting: Emergency Medicine

## 2021-05-28 ENCOUNTER — Other Ambulatory Visit: Payer: Self-pay

## 2021-05-28 ENCOUNTER — Encounter (HOSPITAL_BASED_OUTPATIENT_CLINIC_OR_DEPARTMENT_OTHER): Payer: Self-pay

## 2021-05-28 ENCOUNTER — Emergency Department (HOSPITAL_BASED_OUTPATIENT_CLINIC_OR_DEPARTMENT_OTHER): Payer: Medicare Other

## 2021-05-28 DIAGNOSIS — Z96651 Presence of right artificial knee joint: Secondary | ICD-10-CM | POA: Diagnosis not present

## 2021-05-28 DIAGNOSIS — E119 Type 2 diabetes mellitus without complications: Secondary | ICD-10-CM | POA: Diagnosis not present

## 2021-05-28 DIAGNOSIS — I10 Essential (primary) hypertension: Secondary | ICD-10-CM | POA: Insufficient documentation

## 2021-05-28 DIAGNOSIS — W01198A Fall on same level from slipping, tripping and stumbling with subsequent striking against other object, initial encounter: Secondary | ICD-10-CM | POA: Insufficient documentation

## 2021-05-28 DIAGNOSIS — S0990XA Unspecified injury of head, initial encounter: Secondary | ICD-10-CM

## 2021-05-28 DIAGNOSIS — W19XXXA Unspecified fall, initial encounter: Secondary | ICD-10-CM

## 2021-05-28 DIAGNOSIS — Z79899 Other long term (current) drug therapy: Secondary | ICD-10-CM | POA: Diagnosis not present

## 2021-05-28 DIAGNOSIS — Z7984 Long term (current) use of oral hypoglycemic drugs: Secondary | ICD-10-CM | POA: Diagnosis not present

## 2021-05-28 DIAGNOSIS — Z7901 Long term (current) use of anticoagulants: Secondary | ICD-10-CM | POA: Insufficient documentation

## 2021-05-28 DIAGNOSIS — S0512XA Contusion of eyeball and orbital tissues, left eye, initial encounter: Secondary | ICD-10-CM | POA: Insufficient documentation

## 2021-05-28 NOTE — ED Triage Notes (Signed)
Pt fell and hit left eyebrow on sidewalk. Hematoma noted above left eye. On Xarelto. States had some bleeding pta. Bleeding controlled during triage.

## 2021-05-28 NOTE — ED Provider Notes (Signed)
[facility] EMERGENCY DEPARTMENT Provider Note   CSN: [id number] Arrival date & time: 05/28/21  1227     History Chief Complaint  Patient presents with   James [name]. is a 75 y.o. male.  HPI  75 year old male who is anticoagulated on Xarelto presents the emergency department with concern for mechanical fall and head injury.  Patient states he was outside, trying to pull out his hose when he got tripped up and fell to the ground hitting the left side of his face on concrete.  He did not sustain any other injury to his extremities.  There is no loss of consciousness.  He denies any syncope.  Initially had some bleeding from an abrasion of the left eyebrow but this stopped, now he has some swelling around the left eye.  Denies any neck pain.  No vision changes or eye pain in regards to the left eye.  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   OSA on CPAP 10/30/2017   Hypogonadism, male 10/30/2017   Iron deficiency anemia due to chronic blood loss 11/20/2016   Erosive gastritis with hemorrhage    Melena    Acute blood  loss anemia 09/27/2016   Acute upper GI bleed 09/27/2016   Abnormal liver diagnostic imaging 06/22/2016   Liver cirrhosis secondary to NASH ([place]) 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   Portal vein thrombosis 04/29/2016   Diabetes mellitus ([place]) 04/29/2016   Leukocytosis 04/29/2016   Dyspnea 12/31/2015   Essential hypertension 12/31/2015   Hyperlipidemia 12/31/2015   Nocturia more than twice per night 11/11/2014   Severe obesity (BMI >= 40) ([place]) 11/11/2014   Hypersomnia with sleep apnea 11/11/2014   Polycythemia vera ([place]) 11/29/2011    Past Surgical History:  Procedure Laterality Date   CARDIAC CATHETERIZATION     greater 10 yrs ago, normal   CARPAL TUNNEL RELEASE     bilateral   COLONOSCOPY  06/2005   [facility] Dr Lajoyce Corners   ESOPHAGOGASTRODUODENOSCOPY (EGD) WITH PROPOFOL N/A 09/28/2016   Procedure: ESOPHAGOGASTRODUODENOSCOPY (EGD) WITH PROPOFOL;  Surgeon: Gatha Mayer, MD;  Location: [name];  Service: Endoscopy;  Laterality: N/A;   IR GENERIC HISTORICAL  12/12/2016   IR US GUIDE VASC ACCESS RIGHT 12/12/2016 [name], MD [facility]   IR GENERIC HISTORICAL  12/12/2016   IR VENOGRAM HEPATIC W HEMODYNAMIC EVALUATION 12/12/2016 [name], MD [facility]   IR GENERIC HISTORICAL  12/12/2016   IR TRANSCATHETER BX 12/12/2016 [name], MD [facility]   ROTATOR CUFF REPAIR     right   TENDON REPAIR     left arm   TONSILLECTOMY     TOTAL KNEE ARTHROPLASTY Right 2010   WISDOM TOOTH EXTRACTION         Family History  Problem Relation Age of Onset   Bipolar disorder Son        committed suicide   Colon cancer Other    Heart failure Other    Heart attack Other    Lung disease Father        history   Diabetes Mother        history   Stroke Mother        history   Colon polyps Neg Hx    Rectal cancer Neg Hx    Stomach cancer Neg Hx    Esophageal cancer Neg Hx     Social History   Tobacco Use   Smoking status: Never   Smokeless tobacco:  Never   Tobacco comments:    never used tobacco  Vaping Use   Vaping Use: Never used  Substance Use Topics   Alcohol use: Yes    Alcohol/week: 0.0 standard drinks   Drug use: No    Home Medications Prior to Admission medications   Medication Sig Start Date End Date Taking? Authorizing Provider  amLODipine (NORVASC) 5 MG tablet Take 5 mg by mouth 2 (two) times daily.  05/25/18   [provider]  BENZONATATE PO Take by mouth as needed for cough.    [provider]  buPROPion (WELLBUTRIN XL) 300 MG 24 hr tablet Take 300 mg by mouth every morning. 07/20/20   [provider]  CEPHALEXIN PO Take by mouth. Prior to dental procedure Patient not taking: Reported on 05/26/2021    [provider]  [employer] B-12 500 MCG SUBL Place 2 tablets under the tongue daily with breakfast. Patient not taking: Reported on 05/26/2021 09/17/14   [provider]  Cyanocobalamin ([employer]S B-12 SL) Place 1,000 mg under the tongue daily.    [provider]  dasatinib (SPRYCEL) 70 MG tablet TAKE 1 TABLET (70 MG TOTAL) BY MOUTH DAILY. 01/27/21 01/27/22  [name], MD  desmopressin (DDAVP) 0.2 MG tablet Take 0.4 mg by mouth 2 (two) times daily.  03/21/14   [provider]  fluticasone (FLONASE) 50 MCG/ACT nasal spray Place 1 spray into both nostrils 2 (two) times daily. 10/23/19   [provider]  glucose blood test strip OneTouch Ultra Blue Test Strip  USE TO TEST BLOOD SUGAR ONCE DAILY    [provider]  Lidocaine 4 % PTCH Apply topically daily as needed.    [provider]  LORazepam (ATIVAN) 0.5 MG tablet Take 0.5 mg by mouth daily.  11/01/18   [provider]  metFORMIN (GLUCOPHAGE-XR) 500 MG 24 hr tablet Take 500 mg by mouth 2 (two) times daily. 11/08/20   [provider]  moxifloxacin (VIGAMOX) 0.5 % ophthalmic solution PLACE 1 DROP IN RIGHT EYE FOUR TIMES A DAY USE THE DAY BEFORE, DAY OF, AND DAY AFTER TREATMENT. 02/15/18    [provider]  pantoprazole (PROTONIX) 40 MG tablet Take 40 mg by mouth 2 (two) times daily. 02/26/21   [provider]  polyethylene glycol (MIRALAX / GLYCOLAX) 17 g packet Take 17 g by mouth daily as needed.    [provider]  PRESCRIPTION MEDICATION every  8 (eight) weeks. Gets injections in right eye at dr's office every 8 weeks     [provider]  rosuvastatin (CRESTOR) 20 MG tablet Take 20 mg by mouth at bedtime.    [provider]  testosterone cypionate (DEPOTESTOSTERONE CYPIONATE) 200 MG/ML injection Inject 150 mg into the muscle every 21 ( twenty-one) days.     [provider]  TRULICITY 1.5 OH/6.0VP SOPN SMARTSIG:0.5 Milliliter(s) SUB-Q Once a Week 05/21/20   [provider]  VIAGRA 100 MG tablet Take 100 mg by mouth daily as needed for erectile dysfunction.  10/26/14   [provider]  XARELTO 10 MG TABS tablet TAKE 1 TABLET BY MOUTH EVERY DAY 08/12/20   [name], MD  zolpidem (AMBIEN) 10 MG tablet Take 10 mg by mouth at bedtime as needed. 05/21/20   [provider]    Allergies    Ibuprofen and Nsaids  Review of Systems   Review of Systems  Constitutional:  Negative for fever.  Eyes:  Negative for photophobia, pain, discharge, redness and visual disturbance.  Respiratory:  Negative for shortness of breath.   Cardiovascular:  Negative for chest pain.  Gastrointestinal:  Negative for abdominal pain.  Musculoskeletal:  Positive for neck pain. Negative for joint swelling.  Skin:  Negative for rash.  Neurological:  Positive for headaches.   Physical Exam Updated Vital Signs BP (!) 145/77 (BP Location: Left Arm)   Pulse 76   Temp 98.2 F (36.8 C) (Oral)   Resp 18   Ht 5' 6"  (1.676 m)   Wt 79.4 kg   SpO2 99%   BMI 28.25 kg/m   Physical Exam Vitals and nursing note reviewed.  Constitutional:      Appearance: Normal appearance.  HENT:     Head: Normocephalic.     Comments: Left  eyebrow hematoma with small abrasion, bleeding controlled    Nose: Nose normal.     Mouth/Throat:     Mouth: Mucous membranes are moist.  Eyes:     Extraocular Movements: Extraocular movements intact.     Pupils: Pupils are equal, round, and reactive to light.     Comments: No concerning findings in the right ocular exam, no injected conjunctive a, no signs of ocular injury  Cardiovascular:     Rate and Rhythm: Normal rate.  Pulmonary:     Effort: Pulmonary effort is normal. No respiratory distress.  Abdominal:     Palpations: Abdomen is soft.     Tenderness: There is no abdominal tenderness.  Musculoskeletal:     Cervical back: Normal range of motion. No rigidity or tenderness.  Skin:    General: Skin is warm.     Comments: Scattered abrasions  Neurological:     Mental Status: He is alert and oriented to person, place, and time. Mental status is at baseline.  Psychiatric:        Mood and Affect: Mood normal.    ED Results / Procedures / Treatments   Labs (all labs ordered are listed, but only abnormal results are displayed) Labs Reviewed - No data to display  EKG None  Radiology No results found.  Procedures Procedures   Medications Ordered in ED Medications - No data to display  ED Course  I have reviewed the triage vital signs and the nursing notes.  Pertinent labs & imaging results that were available during my care of the patient were reviewed by me and considered in my medical decision making (see chart for details).  MDM Rules/Calculators/A&P                          75 year old male who is anticoagulated presents emergency department with head injury.  He had a mechanical fall with head injury, no syncope or loss of consciousness.  He is swelling around the left eyebrow with a superficial abrasion, no active bleeding.  No neck pain.  Left ocular exam is unremarkable, no concern for ocular injury at this time.  CT of the head and neck showed no acute  finding.  Patient has been baseline and neuro intact with no complaints.  Stable vitals.  Plan for outpatient follow-up and conservative management.  Patient will be discharged and treated as an outpatient.  Discharge plan and strict return to ED precautions discussed, patient verbalizes understanding and agreement.   Final Clinical Impression(s) / ED Diagnoses Final diagnoses:  None    Rx / DC Orders ED Discharge Orders     None        Lorelle Gibbs, DO 05/28/21 1445

## 2021-05-28 NOTE — Discharge Instructions (Addendum)
You have been seen and discharged from the emergency department.  Your head and neck CT are unremarkable.  You may continue to have some swelling and bruising around the left eye over the next couple days.  This will resolve.  Continue taking your home medications as prescribed.  You may ice the face.  Follow-up with your primary provider for reevaluation and further care. If you have any worsening symptoms, vision changes or further concerns for your health please return to an emergency department for further evaluation.

## 2021-06-01 ENCOUNTER — Encounter: Payer: Self-pay | Admitting: Family Medicine

## 2021-06-01 ENCOUNTER — Ambulatory Visit: Payer: Self-pay

## 2021-06-01 ENCOUNTER — Ambulatory Visit (INDEPENDENT_AMBULATORY_CARE_PROVIDER_SITE_OTHER): Payer: Medicare Other | Admitting: Family Medicine

## 2021-06-01 ENCOUNTER — Other Ambulatory Visit: Payer: Self-pay

## 2021-06-01 VITALS — BP 132/64 | Ht 66.0 in | Wt 170.0 lb

## 2021-06-01 DIAGNOSIS — M25511 Pain in right shoulder: Secondary | ICD-10-CM

## 2021-06-01 NOTE — Progress Notes (Signed)
PCP: Leanna Battles, MD  Subjective:   HPI: Patient is a 75 y.o. male here for right shoulder injury.  6/1: Patient reports Saturday he was coming back from the bathroom, felt 'woozy' and fell down causing injury to right shoulder and right scalp. No chest pain, shortness of breath prior to this. Imaging without intracranial abnormality. He suffered anterior shoulder dislocation that was reduced. This is same shoulder he had rotator cuff repair on remotely. Difficulty trying to lift arm overhead though can do so by lifting with his left arm. Given sling but not using currently.  6/15: Patient returns with continued right shoulder pain and weakness. Limited motion. Unfortunately on Saturday he got tangled up in hoses in the yard and had another fall. CTs of head and cervical spine negative.  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      BENZONATATE PO Take by mouth as needed for cough.     buPROPion (WELLBUTRIN XL) 300 MG 24 hr tablet Take 300 mg by mouth every morning.     CEPHALEXIN PO Take by mouth. Prior to dental procedure  (Patient not taking: Reported on 05/26/2021)     CVS B-12 500 MCG SUBL Place 2 tablets under the tongue daily with breakfast. (Patient not taking: Reported on 05/26/2021)  12   Cyanocobalamin (CVS B-12 SL) Place 1,000 mg under the tongue daily.     dasatinib (SPRYCEL) 70 MG tablet TAKE 1 TABLET (70 MG TOTAL) BY MOUTH DAILY. 30 tablet 4   desmopressin (DDAVP) 0.2 MG tablet Take 0.4 mg by mouth 2 (two) times daily.      fluticasone (FLONASE) 50 MCG/ACT nasal spray Place 1 spray into both nostrils 2 (two) times daily.     glucose blood test strip OneTouch Ultra Blue Test Strip  USE TO TEST BLOOD SUGAR ONCE DAILY     Lidocaine 4 % PTCH Apply topically daily as needed.     LORazepam (ATIVAN) 0.5 MG tablet Take 0.5 mg by mouth daily.   3   metFORMIN (GLUCOPHAGE-XR) 500 MG 24 hr tablet Take 500 mg by mouth 2 (two) times daily.     moxifloxacin (VIGAMOX) 0.5 % ophthalmic solution PLACE 1 DROP IN RIGHT EYE FOUR TIMES A DAY USE THE DAY BEFORE, DAY OF, AND DAY AFTER TREATMENT.  5   pantoprazole (PROTONIX) 40 MG tablet Take 40 mg by mouth 2 (two) times daily.     polyethylene  glycol (MIRALAX / GLYCOLAX) 17 g packet Take 17 g by mouth daily as needed.     PRESCRIPTION MEDICATION every 8 (eight) weeks. Gets injections in right eye at dr's office every 8 weeks      rosuvastatin (CRESTOR) 20 MG tablet Take 20 mg by mouth at bedtime.     testosterone cypionate (DEPOTESTOSTERONE CYPIONATE) 200 MG/ML injection Inject 150 mg into the muscle every 21 ( twenty-one) days.      TRULICITY 1.5 HU/3.1SH SOPN SMARTSIG:0.5 Milliliter(s) SUB-Q Once a Week     VIAGRA 100 MG tablet Take 100 mg by mouth daily as needed for erectile dysfunction.   1   XARELTO 10 MG TABS tablet TAKE 1 TABLET BY MOUTH EVERY DAY 30 tablet 11   zolpidem (AMBIEN) 10 MG tablet Take 10 mg by mouth at bedtime as needed.     No current facility-administered medications on file prior to visit.    Past Surgical History:  Procedure Laterality Date    CARDIAC CATHETERIZATION     greater 10 yrs ago, normal   CARPAL TUNNEL RELEASE     bilateral   COLONOSCOPY  06/2005   [facility] Dr Lajoyce Corners   ESOPHAGOGASTRODUODENOSCOPY (EGD) WITH PROPOFOL N/A 09/28/2016   Procedure: ESOPHAGOGASTRODUODENOSCOPY (EGD) WITH PROPOFOL;  Surgeon: Gatha Mayer, MD;  Location: [facility];  Service: Endoscopy;  Laterality: N/A;   IR GENERIC HISTORICAL  12/12/2016   IR US GUIDE VASC ACCESS RIGHT 12/12/2016 [name], MD [facility]   IR GENERIC HISTORICAL  12/12/2016   IR VENOGRAM HEPATIC W HEMODYNAMIC EVALUATION 12/12/2016 [name], MD [facility]   IR GENERIC HISTORICAL  12/12/2016   IR TRANSCATHETER BX 12/12/2016 [name], MD [facility]   ROTATOR CUFF REPAIR     right   TENDON REPAIR     left arm   TONSILLECTOMY     TOTAL KNEE ARTHROPLASTY Right 2010   WISDOM TOOTH EXTRACTION      Allergies  Allergen Reactions   Ibuprofen Anaphylaxis and Other (See Comments)    Upper GI Bled   Nsaids Other (See Comments)    Social History   Socioeconomic History   Marital status: Married    Spouse name: Not on file   Number of children: 2   Years of education: Not on file   Highest education level: Not on file  Occupational History   Occupation: retired  Tobacco Use   Smoking status: Never   Smokeless tobacco: Never   Tobacco comments:    never used tobacco  Vaping Use   Vaping Use: Never used  Substance and Sexual Activity   Alcohol use: Yes    Alcohol/week: 0.0 standard drinks   Drug use: No   Sexual activity: Not on file  Other Topics Concern   Not on file  Social History Narrative   Right handed.  Caffeine 3 cups daily,  Married 2 kids (one deceased).  College grad.  [employer].    Social Determinants of Health   Financial Resource Strain: Not on file  Food Insecurity: Not on file  Transportation Needs: Not on file  Physical Activity: Not on file  Stress: Not on file  Social Connections: Not on  file  Intimate Partner Violence: Not on file    Family History  Problem Relation Age of Onset   Bipolar disorder Son        committed suicide   Colon cancer Other    Heart failure Other    Heart  attack Other    Lung disease Father        history   Diabetes Mother        history   Stroke Mother        history   Colon polyps Neg Hx    Rectal cancer Neg Hx    Stomach cancer Neg Hx    Esophageal cancer Neg Hx     BP 132/64   Ht 5' 6"  (1.676 m)   Wt 170 lb (77.1 kg)   BMI 27.44 kg/m   No flowsheet data found.  No flowsheet data found.  Review of Systems: See HPI above.     Objective:  Physical Exam:  Gen: NAD, comfortable in exam room  Left and right periorbital hematomas, right improving.  Right shoulder: No swelling, ecchymoses.  No gross deformity but decreased muscle bulk overall. No TTP. Full passive ROM.  Unable to actively abduct, flex past 30 degrees.  Cannot externally rotate actively but full internal rotation. Strength 4/5 internal rotation. NV intact distally.  Limited MSK u/s right shoulder: Superior riding of humeral head - supraspinatus and infraspinatus not visualized consistent with retracted full thickness tears.  Subscapularis irregular and concerning for at least high grade partial tear.  Unable to position to fully evaluate subscapularis.   Assessment & Plan:  1. Right shoulder injury - 2/2 shoulder dislocation with concurrent full thickness supraspinatus and infraspinatus tears, likely subscapularis as well.  He would consider repair so will refer to Dr. Theda Sers who repaired this shoulder in the past.  Believe he would be able to get an MRI faster through their office so will defer this for now.  Discussed these may not be reparable tears and he understands this.  Discussed risks and rehab following repair and what that would look like.  Motion exercises in meantime.

## 2021-06-03 ENCOUNTER — Telehealth: Payer: Self-pay | Admitting: *Deleted

## 2021-06-03 LAB — BCR/ABL

## 2021-06-03 NOTE — Telephone Encounter (Signed)
As noted below by Dr. [name], I informed the patient that he is in remission from his leukemia. He verbalized understanding.

## 2021-06-03 NOTE — Telephone Encounter (Signed)
-----   Message from [name], MD sent at 06/03/2021  3:40 PM EDT ----- Please call and tell him that he is in remission from his leukemia.  I am not surprised by this.  Laurey Arrow

## 2021-06-08 ENCOUNTER — Other Ambulatory Visit: Payer: Self-pay | Admitting: *Deleted

## 2021-06-08 DIAGNOSIS — C9210 Chronic myeloid leukemia, BCR/ABL-positive, not having achieved remission: Secondary | ICD-10-CM

## 2021-06-08 MED ORDER — DASATINIB 70 MG PO TABS: ORAL_TABLET | ORAL | 4 refills | Status: DC

## 2021-06-30 ENCOUNTER — Ambulatory Visit: Payer: Medicare Other | Admitting: Physical Therapy

## 2021-07-01 ENCOUNTER — Ambulatory Visit: Payer: Medicare Other | Attending: Internal Medicine | Admitting: Physical Therapy

## 2021-07-01 ENCOUNTER — Other Ambulatory Visit: Payer: Self-pay

## 2021-07-01 DIAGNOSIS — R2681 Unsteadiness on feet: Secondary | ICD-10-CM | POA: Diagnosis present

## 2021-07-01 DIAGNOSIS — R262 Difficulty in walking, not elsewhere classified: Secondary | ICD-10-CM | POA: Diagnosis present

## 2021-07-01 DIAGNOSIS — M6281 Muscle weakness (generalized): Secondary | ICD-10-CM | POA: Diagnosis present

## 2021-07-01 DIAGNOSIS — R42 Dizziness and giddiness: Secondary | ICD-10-CM | POA: Insufficient documentation

## 2021-07-01 NOTE — Therapy (Signed)
Therapy;Canalith Repostioning;Electrical  Stimulation;Cryotherapy;Iontophoresis 63m/ml Dexamethasone;Moist Heat;DME Instruction;Gait training;Stair training;Functional mobility training;Therapeutic activities;Therapeutic exercise;Balance training;Neuromuscular re-education;Manual techniques;Patient/family education;Passive range of motion;Dry needling;Taping;Vestibular;Spinal Manipulations    PT Next Visit Plan Please assess DGI/FGA and mCTSIB or SOT. Initiate gaze stabilization, habituation, and balance exercises.    PT Home Exercise Plan Will provide next session    Consulted and Agree with Plan of Care Patient;Family member/caregiver    Family Member Consulted Wife, Joy             Patient will benefit from skilled therapeutic intervention in order to improve the following deficits and impairments:  Abnormal gait, Difficulty walking, Dizziness, Decreased activity tolerance, Pain, Decreased range of motion, Decreased balance, Decreased mobility, Decreased strength, Postural dysfunction, Improper body mechanics, Hypomobility  Visit Diagnosis: Unsteadiness on feet  Dizziness and giddiness  Difficulty in walking, not elsewhere classified  Muscle weakness (generalized)     Problem List Patient Active Problem List   Diagnosis Date Noted   CML (chronic myelocytic leukemia) ([place] 12/16/2020   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   OSA on CPAP 10/30/2017   Hypogonadism, male 10/30/2017   Iron deficiency anemia due to chronic blood loss 11/20/2016   Erosive gastritis with hemorrhage    Melena    Acute blood loss anemia 09/27/2016   Acute upper GI bleed 09/27/2016   Abnormal liver diagnostic imaging 06/22/2016   Liver cirrhosis secondary to NASH ([place] 05/03/2016   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   Portal vein thrombosis 04/29/2016   Diabetes mellitus ([place] 04/29/2016   Leukocytosis 04/29/2016   Dyspnea 12/31/2015   Essential hypertension 12/31/2015   Hyperlipidemia 12/31/2015   Nocturia more than twice per night  11/11/2014   Severe obesity (BMI >= 40) ([place] 11/11/2014   Hypersomnia with sleep apnea 11/11/2014   Polycythemia vera ([place] 11/29/2011    Colburn Asper April Ma L Joud Ingwersen PT, DPT 07/01/2021, 1:00 PM  [facility] [place] [address]Phone: [phone]  Fax:  [phone] Name: DJabree Rebert MRN: [id number] of Birth: 5April 08, 1947  [facility] [address] [place], [place], [address] Phone: [phone]   Fax:  [phone]  Physical Therapy Evaluation  Patient Details  Name: James [name]. MRN: [id number] Date of Birth: [birth year] Referring Provider (PT): Leanna Battles, MD   Encounter Date: 07/01/2021   PT End of Session - 07/01/21 1257     Visit Number 1    Number of Visits 17    Date for PT Re-Evaluation 08/26/21    Authorization Type Medicare    Progress Note Due on Visit 10    PT Start Time 0935    PT Stop Time 1015    PT Time Calculation (min) 40 min    Equipment Utilized During Treatment Gait belt    Activity Tolerance Patient tolerated treatment well    Behavior During Therapy WFL for tasks assessed/performed             Past Medical History:  Diagnosis Date   Anxiety    Arthritis    Clotting disorder ([place])    CML (chronic myelocytic leukemia) ([place]) 12/16/2020   Depression    DM (diabetes mellitus) ([place])    GERD (gastroesophageal reflux disease)    Hiatal hernia    History of shingles 04/03/2014   HTN (hypertension)    Hyperlipidemia    Insomnia    resolved by using CPAP   Iron deficiency anemia due to chronic blood loss 11/20/2016   Macular degeneration    Nocturia more than twice per night 11/11/2014   For 6 month, 5 nocturias a night.    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)    History   Portal vein thrombosis    Rhinitis    Situational depression    Sleep apnea    uses CPAP every night   Tubular adenoma 12/10/2015   6 cecum polyps    Past Surgical History:  Procedure Laterality Date   CARDIAC CATHETERIZATION     greater 10 yrs ago, normal   CARPAL TUNNEL RELEASE     bilateral   COLONOSCOPY  06/2005   [facility] [place]   ESOPHAGOGASTRODUODENOSCOPY (EGD) WITH PROPOFOL N/A 09/28/2016   Procedure: ESOPHAGOGASTRODUODENOSCOPY (EGD) WITH PROPOFOL;  Surgeon: Gatha Mayer, MD;  Location: [facility];   Service: Endoscopy;  Laterality: N/A;   IR GENERIC HISTORICAL  12/12/2016   IR US GUIDE VASC ACCESS RIGHT 12/12/2016 [name], MD [facility]   IR GENERIC HISTORICAL  12/12/2016   IR VENOGRAM HEPATIC W HEMODYNAMIC EVALUATION 12/12/2016 [name], MD [facility]   IR GENERIC HISTORICAL  12/12/2016   IR TRANSCATHETER BX 12/12/2016 [name], MD [facility]   ROTATOR CUFF REPAIR     right   TENDON REPAIR     left arm   TONSILLECTOMY     TOTAL KNEE ARTHROPLASTY Right 2010   WISDOM TOOTH EXTRACTION      There were no vitals filed for this visit.    Subjective Assessment - 07/01/21 0938     Subjective Pt had first sign of problems last September -- he fell down 2 steps. Pt landed and hurt both sides of his body. He had x-rays and couldn't determine if pain was LBP or hip bursitis. After that he has fallen 4 additional times. Last fall was on Memorial Day in which he dislocated his shoulder -- will be seeing Ortho on Monday to recheck this. Prior to this he was very stable. Pt reports feeling funny when he walks or moves quickly.  [facility] [address] [place], [place], [address] Phone: [phone]   Fax:  [phone]  Physical Therapy Evaluation  Patient Details  Name: James [name]. MRN: [id number] Date of Birth: [birth year] Referring Provider (PT): Leanna Battles, MD   Encounter Date: 07/01/2021   PT End of Session - 07/01/21 1257     Visit Number 1    Number of Visits 17    Date for PT Re-Evaluation 08/26/21    Authorization Type Medicare    Progress Note Due on Visit 10    PT Start Time 0935    PT Stop Time 1015    PT Time Calculation (min) 40 min    Equipment Utilized During Treatment Gait belt    Activity Tolerance Patient tolerated treatment well    Behavior During Therapy WFL for tasks assessed/performed             Past Medical History:  Diagnosis Date   Anxiety    Arthritis    Clotting disorder ([place])    CML (chronic myelocytic leukemia) ([place]) 12/16/2020   Depression    DM (diabetes mellitus) ([place])    GERD (gastroesophageal reflux disease)    Hiatal hernia    History of shingles 04/03/2014   HTN (hypertension)    Hyperlipidemia    Insomnia    resolved by using CPAP   Iron deficiency anemia due to chronic blood loss 11/20/2016   Macular degeneration    Nocturia more than twice per night 11/11/2014   For 6 month, 5 nocturias a night.    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)    History   Portal vein thrombosis    Rhinitis    Situational depression    Sleep apnea    uses CPAP every night   Tubular adenoma 12/10/2015   6 cecum polyps    Past Surgical History:  Procedure Laterality Date   CARDIAC CATHETERIZATION     greater 10 yrs ago, normal   CARPAL TUNNEL RELEASE     bilateral   COLONOSCOPY  06/2005   [facility] [place]   ESOPHAGOGASTRODUODENOSCOPY (EGD) WITH PROPOFOL N/A 09/28/2016   Procedure: ESOPHAGOGASTRODUODENOSCOPY (EGD) WITH PROPOFOL;  Surgeon: Gatha Mayer, MD;  [facility];   Service: Endoscopy;  Laterality: N/A;   IR GENERIC HISTORICAL  12/12/2016   IR US GUIDE VASC ACCESS RIGHT 12/12/2016 [name], MD [facility]   IR GENERIC HISTORICAL  12/12/2016   IR VENOGRAM HEPATIC W HEMODYNAMIC EVALUATION 12/12/2016 [name], MD [facility]   IR GENERIC HISTORICAL  12/12/2016   IR TRANSCATHETER BX 12/12/2016 [name], MD [facility]   ROTATOR CUFF REPAIR     right   TENDON REPAIR     left arm   TONSILLECTOMY     TOTAL KNEE ARTHROPLASTY Right 2010   WISDOM TOOTH EXTRACTION      There were no vitals filed for this visit.    Subjective Assessment - 07/01/21 0938     Subjective Pt had first sign of problems last September -- he fell down 2 steps. Pt landed and hurt both sides of his body. He had x-rays and couldn't determine if pain was LBP or hip bursitis. After that he has fallen 4 additional times. Last fall was on Memorial Day in which he dislocated his shoulder -- will be seeing Ortho on Monday to recheck this. Prior to this he was very stable. Pt reports feeling funny when he walks or moves quickly.  [facility] [address] [place], [place], [address] Phone: [phone]   Fax:  [phone]  Physical Therapy Evaluation  Patient Details  Name: James [name]. MRN: [id number] Date of Birth: [birth year] Referring Provider (PT): Leanna Battles, MD   Encounter Date: 07/01/2021   PT End of Session - 07/01/21 1257     Visit Number 1    Number of Visits 17    Date for PT Re-Evaluation 08/26/21    Authorization Type Medicare    Progress Note Due on Visit 10    PT Start Time 0935    PT Stop Time 1015    PT Time Calculation (min) 40 min    Equipment Utilized During Treatment Gait belt    Activity Tolerance Patient tolerated treatment well    Behavior During Therapy WFL for tasks assessed/performed             Past Medical History:  Diagnosis Date   Anxiety    Arthritis    Clotting disorder ([place])    CML (chronic myelocytic leukemia) ([place]) 12/16/2020   Depression    DM (diabetes mellitus) ([place])    GERD (gastroesophageal reflux disease)    Hiatal hernia    History of shingles 04/03/2014   HTN (hypertension)    Hyperlipidemia    Insomnia    resolved by using CPAP   Iron deficiency anemia due to chronic blood loss 11/20/2016   Macular degeneration    Nocturia more than twice per night 11/11/2014   For 6 month, 5 nocturias a night.    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)    History   Portal vein thrombosis    Rhinitis    Situational depression    Sleep apnea    uses CPAP every night   Tubular adenoma 12/10/2015   6 cecum polyps    Past Surgical History:  Procedure Laterality Date   CARDIAC CATHETERIZATION     greater 10 yrs ago, normal   CARPAL TUNNEL RELEASE     bilateral   COLONOSCOPY  06/2005   [facility] [place]   ESOPHAGOGASTRODUODENOSCOPY (EGD) WITH PROPOFOL N/A 09/28/2016   Procedure: ESOPHAGOGASTRODUODENOSCOPY (EGD) WITH PROPOFOL;  Surgeon: Gatha Mayer, MD;  [facility];   Service: Endoscopy;  Laterality: N/A;   IR GENERIC HISTORICAL  12/12/2016   IR US GUIDE VASC ACCESS RIGHT 12/12/2016 [name], MD [facility]   IR GENERIC HISTORICAL  12/12/2016   IR VENOGRAM HEPATIC W HEMODYNAMIC EVALUATION 12/12/2016 [name], MD [facility]   IR GENERIC HISTORICAL  12/12/2016   IR TRANSCATHETER BX 12/12/2016 [name], MD [facility]   ROTATOR CUFF REPAIR     right   TENDON REPAIR     left arm   TONSILLECTOMY     TOTAL KNEE ARTHROPLASTY Right 2010   WISDOM TOOTH EXTRACTION      There were no vitals filed for this visit.    Subjective Assessment - 07/01/21 0938     Subjective Pt had first sign of problems last September -- he fell down 2 steps. Pt landed and hurt both sides of his body. He had x-rays and couldn't determine if pain was LBP or hip bursitis. After that he has fallen 4 additional times. Last fall was on Memorial Day in which he dislocated his shoulder -- will be seeing Ortho on Monday to recheck this. Prior to this he was very stable. Pt reports feeling funny when he walks or moves quickly.  [facility] [address] [place], [place], [address] Phone: [phone]   Fax:  [phone]  Physical Therapy Evaluation  Patient Details  Name: James [name]. MRN: [id number] Date of Birth: [birth year] Referring Provider (PT): Leanna Battles, MD   Encounter Date: 07/01/2021   PT End of Session - 07/01/21 1257     Visit Number 1    Number of Visits 17    Date for PT Re-Evaluation 08/26/21    Authorization Type Medicare    Progress Note Due on Visit 10    PT Start Time 0935    PT Stop Time 1015    PT Time Calculation (min) 40 min    Equipment Utilized During Treatment Gait belt    Activity Tolerance Patient tolerated treatment well    Behavior During Therapy WFL for tasks assessed/performed             Past Medical History:  Diagnosis Date   Anxiety    Arthritis    Clotting disorder ([place])    CML (chronic myelocytic leukemia) ([place]) 12/16/2020   Depression    DM (diabetes mellitus) ([place])    GERD (gastroesophageal reflux disease)    Hiatal hernia    History of shingles 04/03/2014   HTN (hypertension)    Hyperlipidemia    Insomnia    resolved by using CPAP   Iron deficiency anemia due to chronic blood loss 11/20/2016   Macular degeneration    Nocturia more than twice per night 11/11/2014   For 6 month, 5 nocturias a night.    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)    History   Portal vein thrombosis    Rhinitis    Situational depression    Sleep apnea    uses CPAP every night   Tubular adenoma 12/10/2015   6 cecum polyps    Past Surgical History:  Procedure Laterality Date   CARDIAC CATHETERIZATION     greater 10 yrs ago, normal   CARPAL TUNNEL RELEASE     bilateral   COLONOSCOPY  06/2005   [facility] [place]   ESOPHAGOGASTRODUODENOSCOPY (EGD) WITH PROPOFOL N/A 09/28/2016   Procedure: ESOPHAGOGASTRODUODENOSCOPY (EGD) WITH PROPOFOL;  Surgeon: Gatha Mayer, MD;  [facility];   Service: Endoscopy;  Laterality: N/A;   IR GENERIC HISTORICAL  12/12/2016   IR US GUIDE VASC ACCESS RIGHT 12/12/2016 [name], MD [facility]   IR GENERIC HISTORICAL  12/12/2016   IR VENOGRAM HEPATIC W HEMODYNAMIC EVALUATION 12/12/2016 [name], MD [facility]   IR GENERIC HISTORICAL  12/12/2016   IR TRANSCATHETER BX 12/12/2016 [name], MD [facility]   ROTATOR CUFF REPAIR     right   TENDON REPAIR     left arm   TONSILLECTOMY     TOTAL KNEE ARTHROPLASTY Right 2010   WISDOM TOOTH EXTRACTION      There were no vitals filed for this visit.    Subjective Assessment - 07/01/21 0938     Subjective Pt had first sign of problems last September -- he fell down 2 steps. Pt landed and hurt both sides of his body. He had x-rays and couldn't determine if pain was LBP or hip bursitis. After that he has fallen 4 additional times. Last fall was on Memorial Day in which he dislocated his shoulder -- will be seeing Ortho on Monday to recheck this. Prior to this he was very stable. Pt reports feeling funny when he walks or moves quickly.

## 2021-07-04 ENCOUNTER — Other Ambulatory Visit: Payer: Self-pay

## 2021-07-04 ENCOUNTER — Ambulatory Visit: Payer: Medicare Other

## 2021-07-04 DIAGNOSIS — R2681 Unsteadiness on feet: Secondary | ICD-10-CM

## 2021-07-04 DIAGNOSIS — R42 Dizziness and giddiness: Secondary | ICD-10-CM

## 2021-07-04 DIAGNOSIS — R262 Difficulty in walking, not elsewhere classified: Secondary | ICD-10-CM

## 2021-07-04 DIAGNOSIS — M6281 Muscle weakness (generalized): Secondary | ICD-10-CM

## 2021-07-04 NOTE — Patient Instructions (Addendum)
Gaze Stabilization: Sitting    Keeping eyes on target on wall 3-4 feet away, tilt head down 15-30 and move head side to side for 60 seconds. Repeat while moving head up and down for 60 seconds. Do 3 sessions per day.   Copyright  VHI. All rights reserved.   Balance: Eyes Open - Bilateral (Firm Surfaces)    Stand, feet together, eyes open. Maintain balance 30 seconds. Repeat 3 times per set. Do 1 sets per session. Do 5 sessions per week.   Feet Together, Head Motion - Eyes Open    With eyes open, feet together, move head slowly: up and down x 10 times. Followed by right and left x 10 times.  Repeat 2 times per session. Do 1 sessions per day.

## 2021-07-04 NOTE — Therapy (Signed)
[facility] [address] [place], [place], [address] Phone: [phone]   Fax:  [phone]  Physical Therapy Treatment  Patient Details  Name: James [name]. MRN: [id number] Date of Birth: [birth year] Referring Provider (PT): Leanna Battles, MD   Encounter Date: 07/04/2021   PT End of Session - 07/04/21 0800     Visit Number 2    Number of Visits 17    Date for PT Re-Evaluation 08/26/21    Authorization Type Medicare    Progress Note Due on Visit 10    PT Start Time 0800    PT Stop Time 0845    PT Time Calculation (min) 45 min    Equipment Utilized During Treatment Gait belt    Activity Tolerance Patient tolerated treatment well    Behavior During Therapy WFL for tasks assessed/performed             Past Medical History:  Diagnosis Date   Anxiety    Arthritis    Clotting disorder ([place])    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)    History   Portal vein thrombosis    Rhinitis    Situational depression    Sleep apnea    uses CPAP every night   Tubular adenoma 12/10/2015   6 cecum polyps    Past Surgical History:  Procedure Laterality Date   CARDIAC CATHETERIZATION     greater 10 yrs ago, normal   CARPAL TUNNEL RELEASE     bilateral   COLONOSCOPY  06/2005   [facility] [place]   ESOPHAGOGASTRODUODENOSCOPY (EGD) WITH PROPOFOL N/A 09/28/2016   Procedure: ESOPHAGOGASTRODUODENOSCOPY (EGD) WITH PROPOFOL;  Surgeon: Gatha Mayer, MD;  Location: [facility];   Service: Endoscopy;  Laterality: N/A;   IR GENERIC HISTORICAL  12/12/2016   IR US GUIDE VASC ACCESS RIGHT 12/12/2016 [name], MD [facility]   IR GENERIC HISTORICAL  12/12/2016   IR VENOGRAM HEPATIC W HEMODYNAMIC EVALUATION 12/12/2016 [name], MD [facility]   IR GENERIC HISTORICAL  12/12/2016   IR TRANSCATHETER BX 12/12/2016 [name], MD [facility]   ROTATOR CUFF REPAIR     right   TENDON REPAIR     left arm   TONSILLECTOMY     TOTAL KNEE ARTHROPLASTY Right 2010   WISDOM TOOTH EXTRACTION      There were no vitals filed for this visit.   Subjective Assessment - 07/04/21 0800     Subjective Patient reports no new changes. No falls. Patient reports continue to have mild dizziness with looking downward and standing up to quickly.    Pertinent History Anxiety, arthritis, CML, depression, diabetes, GERD, HTN, hyperlipidemia, insomina, macular degeneration, prior R TKA    Limitations Standing;Walking;House hold activities    How long can you sit comfortably? n/a    How long can you stand comfortably? has chronic back/hip pain    How long can you walk comfortably? House to car only  due to not feeling comfortable; will use RW in grocery store    Diagnostic tests Awaiting MRI results for R shoulder    Patient Stated Goals Improve balance and decrease falls/unsteadiness/dizziness; wife would like pt to be able to walk/stand straighter    Currently in Pain? No/denies                [facility] PT Assessment - 07/04/21 0001       Functional Gait  Assessment   Gait assessed  Yes    Gait Level Surface Walks 20 ft, slow speed, abnormal gait pattern, evidence for imbalance or deviates 10-15 in outside of the 12 in walkway width. Requires more than 7 sec to ambulate 20 ft.    Change in Gait Speed Able to change speed, demonstrates mild gait deviations, deviates 6-10 in outside of the 12 in walkway width, or no gait deviations, unable to achieve a major change in velocity, or  uses a change in velocity, or uses an assistive device.    Gait with Horizontal Head Turns Performs head turns smoothly with slight change in gait velocity (eg, minor disruption to smooth gait path), deviates 6-10 in outside 12 in walkway width, or uses an assistive device.    Gait with Vertical Head Turns Performs task with slight change in gait velocity (eg, minor disruption to smooth gait path), deviates 6 - 10 in outside 12 in walkway width or uses assistive device    Gait and Pivot Turn Pivot turns safely in greater than 3 sec and stops with no loss of balance, or pivot turns safely within 3 sec and stops with mild imbalance, requires small steps to catch balance.    Step Over Obstacle Is able to step over one shoe box (4.5 in total height) without changing gait speed. No evidence of imbalance.    Gait with Narrow Base of Support Ambulates less than 4 steps heel to toe or cannot perform without assistance.    Gait with Eyes Closed Walks 20 ft, slow speed, abnormal gait pattern, evidence for imbalance, deviates 10-15 in outside 12 in walkway width. Requires more than 9 sec to ambulate 20 ft.    Ambulating Backwards Walks 20 ft, slow speed, abnormal gait pattern, evidence for imbalance, deviates 10-15 in outside 12 in walkway width.    Steps Alternating feet, must use rail.    Total Score 15    FGA comment: 15/30              [facility] Adult PT Treatment/Exercise - 07/04/21 0001       Ambulation/Gait   Ambulation/Gait Yes    Ambulation/Gait Assistance 5: Supervision    Ambulation/Gait Assistance Details throughout therapy session with activities    Assistive device None    Gait Pattern Step-through pattern;Lateral trunk lean to right;Lateral hip instability;Trunk flexed;Abducted - left;Abducted- right    Ambulation Surface Level;Indoor      Neuro Re-ed    Neuro Re-ed Details  M-CTSIB: Patient able to hold situation 1 for 30 seconds; situation 2 for 20.35 seconds, situation 3: 30 seconds,  situation 4: 11 seconds. Increased postural sway with vision removed.             Vestibular Treatment/Exercise - 07/04/21 0001       Vestibular Treatment/Exercise   Vestibular Treatment Provided Gaze    Gaze Exercises X1 Viewing Horizontal;X1 Viewing Vertical      X1 Viewing Horizontal   Foot Position seated    Reps 2  Comments x 30 seconds (no dizziness), x 60 seconds      X1 Viewing Vertical   Foot Position seated    Reps 2    Comments x 30 seconds (no dizziness), x 60 seconds                Balance Exercises - 07/04/21 0001       Balance Exercises: Standing   Standing Eyes Opened Narrow base of support (BOS);Head turns;Solid surface;Limitations    Standing Eyes Opened Limitations horizontal/vertical head turns x 10 reps    Standing Eyes Closed Narrow base of support (BOS);Solid surface;3 reps;30 secs;Limitations    Standing Eyes Closed Limitations standing eyes closed 3 x 30 seconds. increased postural sway noted               PT Education - 07/04/21 0839     Education Details Initial HEP; FGA/M-CTSIB Results    Person(s) Educated Patient    Methods Explanation;Demonstration;Handout    Comprehension Verbalized understanding;Returned demonstration              PT Short Term Goals - 07/01/21 1247       PT SHORT TERM GOAL #1   Title Pt will be independent with initial HEP    Time 4    Period Weeks    Status New    Target Date 07/29/21      PT SHORT TERM GOAL #2   Title Pt will report at least 25% improvement in his dizziness    Time 4    Period Weeks    Status New    Target Date 07/29/21      PT SHORT TERM GOAL #3   Title PT will assess and provide LTGs for dynamic gait    Time 2    Period Weeks    Status New    Target Date 07/29/21      PT SHORT TERM GOAL #4   Title PT will assess and provide LTGs for mCTSIB or SOT    Time 2    Period Weeks    Status New    Target Date 07/15/21      PT SHORT TERM GOAL #5   Title Pt will  have improved TUG time to </=12 sec to demo decreased fall risk    Baseline 14    Time 4    Period Weeks    Status New    Target Date 07/29/21               PT Long Term Goals - 07/01/21 1249       PT LONG TERM GOAL #1   Title Pt will be independent with final HEP    Time 8    Period Weeks    Status New    Target Date 08/26/21      PT LONG TERM GOAL #2   Title Pt will report >50% improvement with his dizziness    Time 8    Period Weeks    Status New    Target Date 08/26/21      PT LONG TERM GOAL #3   Title Pt will be able to amb >1000' indoors and outdoors without a/James for improved community mobility    Baseline Currently self limiting himself from house to the car and to appointments    Time 8    Period Weeks    Status New    Target Date 08/26/21      PT LONG TERM GOAL #4  Title Pt will have improved FOTO score to at least 57    Baseline 43    Time 8    Period Weeks    Status New    Target Date 08/26/21                   Plan - 07/04/21 0854     Clinical Impression Statement Completed further balance assesment with FGA, with patient scoring 15/30 indicating high risk for falls. With M-CTSIB patient demo increased challenge with vision remoed and increased postural sway. Rest of session spent establishing initial HEP focused on corner balance and VOR x 1. Patient tolerating well overall. Will continue to progress toward all LTGs.    Personal Factors and Comorbidities Age;Fitness;Comorbidity 1;Comorbidity 2;Comorbidity 3+    Comorbidities R TKA, Anxiety, arthritis, CML, depression, diabetes, GERD, HTN, hyperlipidemia, insomina, decreased L vision    Examination-Activity Limitations Bend;Stand;Stairs;Squat;Locomotion Level;Transfers    Examination-Participation Restrictions Community Activity;Yard Work;Cleaning    Stability/Clinical Decision Making Evolving/Moderate complexity    Rehab Potential Good    PT Frequency 2x / week    PT Duration 8 weeks     PT Treatment/Interventions ADLs/Self Care Home Management;Aquatic Therapy;Canalith Repostioning;Electrical Stimulation;Cryotherapy;Iontophoresis 83m/ml Dexamethasone;Moist Heat;DME Instruction;Gait training;Stair training;Functional mobility training;Therapeutic activities;Therapeutic exercise;Balance training;Neuromuscular re-education;Manual techniques;Patient/family education;Passive range of motion;Dry needling;Taping;Vestibular;Spinal Manipulations    PT Next Visit Plan Continue gaze stabilization, habituation, and balance exercises. May want to swith exercises to medbridge for pictures. Had to use patient instructions due to no internet.    PT Home Exercise Plan see patient instructions    Consulted and Agree with Plan of Care Patient;Family member/caregiver    Family Member Consulted Wife, Joy             Patient will benefit from skilled therapeutic intervention in order to improve the following deficits and impairments:  Abnormal gait, Difficulty walking, Dizziness, Decreased activity tolerance, Pain, Decreased range of motion, Decreased balance, Decreased mobility, Decreased strength, Postural dysfunction, Improper body mechanics, Hypomobility  Visit Diagnosis: Unsteadiness on feet  Difficulty in walking, not elsewhere classified  Dizziness and giddiness  Muscle weakness (generalized)     Problem List Patient Active Problem List   Diagnosis Date Noted   C[place]ch[place]ocytic [place]) ([place] 12/16/2020   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   OSA on CPAP 10/30/2017   Hypogonadism, male 10/30/2017   Iron deficiency anemia due to chronic blood loss 11/20/2016   Erosive gastritis with hemorrhage    Melena    Acute blood loss anemia 09/27/2016   Acute upper GI bleed 09/27/2016   Abnormal liver diagnostic imaging 06/22/2016   Liver cirrhosis secondary to NASH ([place] 05/03/2016   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 11/11/2014   Hypersomnia with sleep apnea 11/11/2014   Polycythemia vera ([place] 11/29/2011    KJones Bales PT, DPT 07/04/2021, 8:56 AM  [facility] [address]Phone: [phone]  Fax:  [phone] Name: DJru [name] MRN: [id number] of Birth: [birth year]

## 2021-07-06 ENCOUNTER — Other Ambulatory Visit: Payer: Self-pay

## 2021-07-06 ENCOUNTER — Ambulatory Visit: Payer: Medicare Other | Admitting: Physical Therapy

## 2021-07-06 DIAGNOSIS — R42 Dizziness and giddiness: Secondary | ICD-10-CM

## 2021-07-06 DIAGNOSIS — R2681 Unsteadiness on feet: Secondary | ICD-10-CM

## 2021-07-06 DIAGNOSIS — M6281 Muscle weakness (generalized): Secondary | ICD-10-CM

## 2021-07-06 DIAGNOSIS — R262 Difficulty in walking, not elsewhere classified: Secondary | ICD-10-CM

## 2021-07-06 NOTE — Therapy (Signed)
[facility] [address] [place], [place], [address] Phone: [phone]   Fax:  [phone]  Physical Therapy Treatment  Patient Details  Name: James [name]. MRN: [id number] Date of Birth: 06/21/1946 Referring Provider (PT): Jarome Matin, MD   Encounter Date: 07/06/2021   PT End of Session - 07/06/21 0757     Visit Number 3    Number of Visits 17    Date for PT Re-Evaluation 08/26/21    Authorization Type Medicare    Progress Note Due on Visit 10    PT Start Time 0758    PT Stop Time 0840    PT Time Calculation (min) 42 min    Equipment Utilized During Treatment Gait belt    Activity Tolerance Patient tolerated treatment well    Behavior During Therapy WFL for tasks assessed/performed             Past Medical History:  Diagnosis Date   Anxiety    Arthritis    Clotting disorder ([facility])    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)    History   Portal vein thrombosis    Rhinitis    Situational depression    Sleep apnea    uses CPAP every night   Tubular adenoma 12/10/2015   6 cecum polyps    Past Surgical History:  Procedure Laterality Date   CARDIAC CATHETERIZATION     greater 10 yrs ago, normal   CARPAL TUNNEL RELEASE     bilateral   COLONOSCOPY  06/2005   [facility] [place]   ESOPHAGOGASTRODUODENOSCOPY (EGD) WITH PROPOFOL N/A 09/28/2016   Procedure: ESOPHAGOGASTRODUODENOSCOPY (EGD) WITH PROPOFOL;  Surgeon: Iva Boop, MD;  [facility];   Service: Endoscopy;  Laterality: N/A;   IR GENERIC HISTORICAL  12/12/2016   IR US GUIDE VASC ACCESS RIGHT 12/12/2016 Jolaine Click, MD [facility]   IR GENERIC HISTORICAL  12/12/2016   IR VENOGRAM HEPATIC W HEMODYNAMIC EVALUATION 12/12/2016 Jolaine Click, MD [facility]   IR GENERIC HISTORICAL  12/12/2016   IR TRANSCATHETER BX 12/12/2016 Jolaine Click, MD [facility]   ROTATOR CUFF REPAIR     right   TENDON REPAIR     left arm   TONSILLECTOMY     TOTAL KNEE ARTHROPLASTY Right 2010   WISDOM TOOTH EXTRACTION      There were no vitals filed for this visit.   Subjective Assessment - 07/06/21 0802     Subjective Nothing new or different. Pt reports hips feeling sore from sleeping wrong. Pt states he has been able to try his exercises some.    Pertinent History Anxiety, arthritis, CML, depression, diabetes, GERD, HTN, hyperlipidemia, insomina, macular degeneration, prior R TKA    Limitations Standing;Walking;House hold activities    How long can you sit comfortably? n/a    How long can you stand comfortably? has chronic back/hip pain    How long can you walk comfortably? House to car  only due to not feeling comfortable; will use RW in grocery store    Diagnostic tests Awaiting MRI results for R shoulder    Patient Stated Goals Improve balance and decrease falls/unsteadiness/dizziness; wife would like pt to be able to walk/stand straighter                               [facility] Adult PT Treatment/Exercise - 07/06/21 0001       Ambulation/Gait   Ambulation/Gait Assistance Details throughout gym during treatment    Assistive device None    Gait Pattern Step-through pattern;Lateral trunk lean to right;Lateral hip instability;Abducted - left;Abducted- right    Ambulation Surface Level;Indoor      Posture/Postural Control   Posture Comments Increased posterior pelvic tilt leading to increased extension this session      Exercises   Exercises Knee/Hip      Knee/Hip  Exercises: Stretches   Passive Hamstring Stretch 20 seconds    Hip Flexor Stretch 30 seconds    Piriformis Stretch 20 seconds      Knee/Hip Exercises: Standing   Heel Raises 2 sets;10 reps;Both    Hip Abduction Stengthening;Both;2 sets;10 reps    Abduction Limitations red tband around knees    Hip Extension Stengthening;Both;2 sets    Extension Limitations red tband    Other Standing Knee Exercises palloff press 2x10 each side    Other Standing Knee Exercises Marching 2x10             Vestibular Treatment/Exercise - 07/06/21 0001       X1 Viewing Horizontal   Foot Position standing feet together    Reps 2    Comments x30 sec      X1 Viewing Vertical   Foot Position standing feet together    Reps 2    Comments x30 sec                Balance Exercises - 07/06/21 0001       Balance Exercises: Standing   Standing Eyes Closed 30 secs;Limitations;Foam/compliant surface;Wide (BOA);3 reps    Standing Eyes Closed Limitations 3x30 sec EC feet together narrow BOS  and then EC feet apart on pillow 3x30 sec                 PT Short Term Goals - 07/06/21 0853       PT SHORT TERM GOAL #1   Title Pt will be independent with initial HEP    Time 4    Period Weeks    Status On-going    Target Date 07/29/21      PT SHORT TERM GOAL #2   Title Pt will report at least 25% improvement in his dizziness    Time 4    Period Weeks    Status On-going    Target Date 07/29/21      PT SHORT TERM GOAL #3   Title PT will assess and provide LTGs for dynamic gait    Time 2    Period Weeks    Status Achieved    Target Date 07/29/21      PT SHORT TERM GOAL #4   Title Pt will be able to maintain balance for at least 30 sec in all conditions of mCTSIB to demo improving sensory integration    Baseline M-CTSIB: situation 2 for 20.35 seconds, situation 4: 11 seconds    Time 4    Period Weeks  Status Revised    Target Date 07/15/21      PT SHORT TERM GOAL #5   Title Pt  will have improved TUG time to </=12 sec to demo decreased fall risk    Baseline 14    Time 4    Period Weeks    Status On-going    Target Date 07/29/21               PT Long Term Goals - 07/06/21 0853       PT LONG TERM GOAL #1   Title Pt will be independent with final HEP (all LTGs due 08/26/21)    Time 8    Period Weeks    Status On-going      PT LONG TERM GOAL #2   Title Pt will report >50% improvement with his dizziness    Time 8    Period Weeks    Status On-going      PT LONG TERM GOAL #3   Title Pt will be able to amb >1000' indoors and outdoors without a/d for improved community mobility    Baseline Currently self limiting himself from house to the car and to appointments    Time 8    Period Weeks    Status On-going      PT LONG TERM GOAL #4   Title Pt will have improved FOTO score to at least 57    Baseline 43    Time 8    Period Weeks    Status On-going      PT LONG TERM GOAL #5   Title Pt will have improved FGA score to at least 22/30 to demonstrate decreased risk of falls    Baseline 15/30    Time 8    Period Weeks    Status New    Target Date 08/26/21      Additional Long Term Goals   Additional Long Term Goals Yes                   Plan - 07/06/21 0844     Clinical Impression Statement Treatment focused on improving pt's stability and balance with eyes closed. Noticeable increased trunk extension/backwards lean with eyes closed - improved by end of session. Initiated core and hip strengthening to counteract pt's posture. Pt required rest breaks due to fatigue (pt notes he has been mostly in bed for the last 9 months and has lost strength). Pt with difficulty isolating neck movements from body movements.    Personal Factors and Comorbidities Age;Fitness;Comorbidity 1;Comorbidity 2;Comorbidity 3+    Comorbidities R TKA, Anxiety, arthritis, CML, depression, diabetes, GERD, HTN, hyperlipidemia, insomina, decreased L vision     Examination-Activity Limitations Bend;Stand;Stairs;Squat;Locomotion Level;Transfers    Examination-Participation Restrictions Community Activity;Yard Work;Cleaning    Stability/Clinical Decision Making Evolving/Moderate complexity    Rehab Potential Good    PT Frequency 2x / week    PT Duration 8 weeks    PT Treatment/Interventions ADLs/Self Care Home Management;Aquatic Therapy;Canalith Repostioning;Electrical Stimulation;Cryotherapy;Iontophoresis 4mg /ml Dexamethasone;Moist Heat;DME Instruction;Gait training;Stair training;Functional mobility training;Therapeutic activities;Therapeutic exercise;Balance training;Neuromuscular re-education;Manual techniques;Patient/family education;Passive range of motion;Dry needling;Taping;Vestibular;Spinal Manipulations    PT Next Visit Plan Continue gaze stabilization, horizontal/vertical head movements, and balance exercises (specifically with eyes closed and on compliant surfaces). Continue neck stretches/AROM to increase movement without additional body movements. Continue strengthening as needed for core and hip stability.    PT Home Exercise Plan see patient instructions    Consulted and Agree with Plan of Care Patient;Family member/caregiver  Family Member Consulted Wife, Joy             Patient will benefit from skilled therapeutic intervention in order to improve the following deficits and impairments:  Abnormal gait, Difficulty walking, Dizziness, Decreased activity tolerance, Pain, Decreased range of motion, Decreased balance, Decreased mobility, Decreased strength, Postural dysfunction, Improper body mechanics, Hypomobility  Visit Diagnosis: Unsteadiness on feet  Difficulty in walking, not elsewhere classified  Dizziness and giddiness  Muscle weakness (generalized)     Problem List Patient Active Problem List   Diagnosis Date Noted   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    OSA on CPAP 10/30/2017   Hypogonadism, male 10/30/2017   Iron deficiency anemia due to chronic blood loss 11/20/2016   Erosive gastritis with hemorrhage    Melena    Acute blood loss anemia 09/27/2016   Acute upper GI bleed 09/27/2016   Abnormal liver diagnostic imaging 06/22/2016   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/03/2016   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 [address] [place], [place], [address] Phone: [phone]   Fax:  [phone]  Name: James [name]. MRN: [id number] Date of Birth: Feb 11, 1946

## 2021-07-11 ENCOUNTER — Ambulatory Visit: Payer: Medicare Other

## 2021-07-11 ENCOUNTER — Other Ambulatory Visit: Payer: Self-pay

## 2021-07-11 DIAGNOSIS — R262 Difficulty in walking, not elsewhere classified: Secondary | ICD-10-CM

## 2021-07-11 DIAGNOSIS — R2681 Unsteadiness on feet: Secondary | ICD-10-CM | POA: Diagnosis not present

## 2021-07-11 DIAGNOSIS — R42 Dizziness and giddiness: Secondary | ICD-10-CM

## 2021-07-11 DIAGNOSIS — M6281 Muscle weakness (generalized): Secondary | ICD-10-CM

## 2021-07-11 NOTE — Patient Instructions (Signed)
Gaze Stabilization: Standing Feet Together    Feet together, keeping eyes on target on wall 3-4 feet away, tilt head down 15-30 and move head side to side for 30 seconds. Repeat while moving head up and down for 30 seconds. Do 2-3 sessions per day. Copyright  V[employer]. All rights reserved.

## 2021-07-11 NOTE — Therapy (Signed)
[facility] [address] [place], [place], [address] Phone: [phone]   Fax:  [phone]  Physical Therapy Treatment  Patient Details  Name: James [name]. MRN: [id number] Date of Birth: 06-27-1946 Referring Provider (PT): Leanna Battles, MD   Encounter Date: 07/11/2021   PT End of Session - 07/11/21 0719     Visit Number 4    Number of Visits 17    Date for PT Re-Evaluation 08/26/21    Authorization Type Medicare    Progress Note Due on Visit 10    PT Start Time 0716    PT Stop Time 0759    PT Time Calculation (min) 43 min    Equipment Utilized During Treatment Gait belt    Activity Tolerance Patient tolerated treatment well    Behavior During Therapy WFL for tasks assessed/performed             Past Medical History:  Diagnosis Date   Anxiety    Arthritis    Clotting disorder ([place])    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)    History   Portal vein thrombosis    Rhinitis    Situational depression    Sleep apnea    uses CPAP every night   Tubular adenoma 12/10/2015   6 cecum polyps    Past Surgical History:  Procedure Laterality Date   CARDIAC CATHETERIZATION     greater 10 yrs ago, normal   CARPAL TUNNEL RELEASE     bilateral   COLONOSCOPY  06/2005   [facility] [place]   ESOPHAGOGASTRODUODENOSCOPY (EGD) WITH PROPOFOL N/A 09/28/2016   Procedure: ESOPHAGOGASTRODUODENOSCOPY (EGD) WITH PROPOFOL;  Surgeon: Gatha Mayer, MD;  Location: [facility];   Service: Endoscopy;  Laterality: N/A;   IR GENERIC HISTORICAL  12/12/2016   IR US GUIDE VASC ACCESS RIGHT 12/12/2016 [name], MD [facility]   IR GENERIC HISTORICAL  12/12/2016   IR VENOGRAM HEPATIC W HEMODYNAMIC EVALUATION 12/12/2016 [name], MD [facility]   IR GENERIC HISTORICAL  12/12/2016   IR TRANSCATHETER BX 12/12/2016 [name], MD [facility]   ROTATOR CUFF REPAIR     right   TENDON REPAIR     left arm   TONSILLECTOMY     TOTAL KNEE ARTHROPLASTY Right 2010   WISDOM TOOTH EXTRACTION      There were no vitals filed for this visit.   Subjective Assessment - 07/11/21 0719     Subjective Patient denies new changes. Reports bursitis is still bothering him with turns. No pain or falls to report.    Pertinent History Anxiety, arthritis, CML, depression, diabetes, GERD, HTN, hyperlipidemia, insomina, macular degeneration, prior R TKA    Limitations Standing;Walking;House hold activities    How long can you sit comfortably? n/a    How long can you stand comfortably? has chronic back/hip pain    How long can you walk comfortably? House to car only due to not feeling  comfortable; will use RW in grocery store    Diagnostic tests Awaiting MRI results for R shoulder    Patient Stated Goals Improve balance and decrease falls/unsteadiness/dizziness; wife would like pt to be able to walk/stand straighter    Currently in Pain? No/denies                Vestibular Treatment/Exercise - 07/11/21 0001       Vestibular Treatment/Exercise   Vestibular Treatment Provided Gaze      X1 Viewing Horizontal   Foot Position standing feet together    Reps 2    Comments x 30 seconds; tactile cues to dissociate head/body movement.      X1 Viewing Vertical   Foot Position standing feet together    Reps 2    Comments x 30 seconds; mild dizziness.            Gaze Stabilization: Standing Feet Together    Feet together, keeping eyes on target on wall 3-4 feet away, tilt  head down 15-30 and move head side to side for 30 seconds. Repeat while moving head up and down for 30 seconds. Do 2-3 sessions per day. Copyright  VHI. All rights reserved.     Balance Exercises - 07/11/21 0001       Balance Exercises: Standing   Standing Eyes Opened Narrow base of support (BOS);Head turns;Foam/compliant surface;Limitations    Standing Eyes Opened Limitations completed standing narrow BOS with eyes open and horizontal/vertical head turns x 15 reps, continue cues to promote head movement vs. trunk movement. no dizziness.    Standing Eyes Closed Wide (BOA);Foam/compliant surface;3 reps;30 secs;Limitations    Standing Eyes Closed Limitations standing EC feet wide BOS 3 x 30 seconds, one instance of CGA required.    SLS with Vectors Foam/compliant surface;Intermittent upper extremity assist;Limitations    SLS with Vectors Limitations standing on airex alternating toe taps to cones with slow progression from BUE support to no UE support x 15 reps, CGA throughout.    Stepping Strategy Anterior;Posterior;Foam/compliant surface;Limitations    Stepping Strategy Limitations x 10 reps anterior and posterior direction with BUE support, cues for step length. Increased challenge with step back on.    Step Ups Forward;6 inch;Intermittent UE support;Limitations    Step Ups Limitations standing on airex completed alternating step ups to 6" step x 15 reps, slowly progressing from single UE support to No UE support. CGA required and cues for foot position w/o UE support.    Tandem Gait Forward;Upper extremity support;Limitations    Tandem Gait Limitations on firm surface x 3 laps down and back slow progression to single UE support    Sidestepping Foam/compliant support;3 reps;Limitations    Sidestepping Limitations on blue balance beam x 3 laps down and back slow progression to single UE support               PT Education - 07/11/21 0728     Education Details Progress VOR to  Standing Feet Together    Person(s) Educated Patient    Methods Explanation;Demonstration;Handout    Comprehension Verbalized understanding;Returned demonstration              PT Short Term Goals - 07/06/21 0853       PT SHORT TERM GOAL #1   Title Pt will be independent with initial HEP    Time 4    Period Weeks    Status On-going    Target Date 07/29/21      PT SHORT TERM  GOAL #2   Title Pt will report at least 25% improvement in his dizziness    Time 4    Period Weeks    Status On-going    Target Date 07/29/21      PT SHORT TERM GOAL #3   Title PT will assess and provide LTGs for dynamic gait    Time 2    Period Weeks    Status Achieved    Target Date 07/29/21      PT SHORT TERM GOAL #4   Title Pt will be able to maintain balance for at least 30 sec in all conditions of mCTSIB to demo improving sensory integration    Baseline M-CTSIB: situation 2 for 20.35 seconds, situation 4: 11 seconds    Time 4    Period Weeks    Status Revised    Target Date 07/15/21      PT SHORT TERM GOAL #5   Title Pt will have improved TUG time to </=12 sec to demo decreased fall risk    Baseline 14    Time 4    Period Weeks    Status On-going    Target Date 07/29/21               PT Long Term Goals - 07/06/21 0853       PT LONG TERM GOAL #1   Title Pt will be independent with final HEP (all LTGs due 08/26/21)    Time 8    Period Weeks    Status On-going      PT LONG TERM GOAL #2   Title Pt will report >50% improvement with his dizziness    Time 8    Period Weeks    Status On-going      PT LONG TERM GOAL #3   Title Pt will be able to amb >1000' indoors and outdoors without a/d for improved community mobility    Baseline Currently self limiting himself from house to the car and to appointments    Time 8    Period Weeks    Status On-going      PT LONG TERM GOAL #4   Title Pt will have improved FOTO score to at least 57    Baseline 43    Time 8    Period Weeks     Status On-going      PT LONG TERM GOAL #5   Title Pt will have improved FGA score to at least 22/30 to demonstrate decreased risk of falls    Baseline 15/30    Time 8    Period Weeks    Status New    Target Date 08/26/21      Additional Long Term Goals   Additional Long Term Goals Yes                   Plan - 07/11/21 0800     Clinical Impression Statement Continued progression of VOR x 1 to standing with feet narrow stance, patient tolerating well therefore progressed HEP to complete at home. Continued balance exercises focused on complaint surface and slow reducing UE support. Increased challenge with SLS noted on LLE, intermittent CGA required throughout. Patient did require intermittent rest break due to fatigue and discomfort in hip.    Personal Factors and Comorbidities Age;Fitness;Comorbidity 1;Comorbidity 2;Comorbidity 3+    Comorbidities R TKA, Anxiety, arthritis, CML, depression, diabetes, GERD, HTN, hyperlipidemia, insomina, decreased L vision    Examination-Activity Limitations Bend;Stand;Stairs;Squat;Locomotion Level;Transfers  Examination-Participation Restrictions Community Activity;Yard Work;Cleaning    Stability/Clinical Decision Making Evolving/Moderate complexity    Rehab Potential Good    PT Frequency 2x / week    PT Duration 8 weeks    PT Treatment/Interventions ADLs/Self Care Home Management;Aquatic Therapy;Canalith Repostioning;Electrical Stimulation;Cryotherapy;Iontophoresis 41m/ml Dexamethasone;Moist Heat;DME Instruction;Gait training;Stair training;Functional mobility training;Therapeutic activities;Therapeutic exercise;Balance training;Neuromuscular re-education;Manual techniques;Patient/family education;Passive range of motion;Dry needling;Taping;Vestibular;Spinal Manipulations    PT Next Visit Plan Continue gaze stabilization, horizontal/vertical head movements, and balance exercises (specifically with eyes closed and on compliant surfaces).  Continue neck stretches/AROM to increase movement without additional body movements. Continue strengthening as needed for core and hip stability.    PT Home Exercise Plan see patient instructions    Consulted and Agree with Plan of Care Patient;Family member/caregiver    Family Member Consulted Wife, Joy             Patient will benefit from skilled therapeutic intervention in order to improve the following deficits and impairments:  Abnormal gait, Difficulty walking, Dizziness, Decreased activity tolerance, Pain, Decreased range of motion, Decreased balance, Decreased mobility, Decreased strength, Postural dysfunction, Improper body mechanics, Hypomobility  Visit Diagnosis: Unsteadiness on feet  Difficulty in walking, not elsewhere classified  Dizziness and giddiness  Muscle weakness (generalized)     Problem List Patient Active Problem List   Diagnosis Date Noted   CML (chronic myelocytic leukemia) ([facility] 12/16/2020   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   OSA on CPAP 10/30/2017   Hypogonadism, male 10/30/2017   Iron deficiency anemia due to chronic blood loss 11/20/2016   Erosive gastritis with hemorrhage    Melena    Acute blood loss anemia 09/27/2016   Acute upper GI bleed 09/27/2016   Abnormal liver diagnostic imaging 06/22/2016   Liver cirrhosis secondary to NASH ([place] 05/03/2016   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

## 2021-07-14 ENCOUNTER — Ambulatory Visit: Payer: Medicare Other

## 2021-07-14 ENCOUNTER — Other Ambulatory Visit: Payer: Self-pay

## 2021-07-14 DIAGNOSIS — R262 Difficulty in walking, not elsewhere classified: Secondary | ICD-10-CM

## 2021-07-14 DIAGNOSIS — R42 Dizziness and giddiness: Secondary | ICD-10-CM

## 2021-07-14 DIAGNOSIS — M6281 Muscle weakness (generalized): Secondary | ICD-10-CM

## 2021-07-14 DIAGNOSIS — R2681 Unsteadiness on feet: Secondary | ICD-10-CM

## 2021-07-14 NOTE — Therapy (Signed)
[facility] [address] [place], [place], [address] Phone: [phone]   Fax:  [phone]  Physical Therapy Treatment  Patient Details  Name: James [name]. MRN: [id number] Date of Birth: 1946/10/02 Referring Provider (PT): Leanna Battles, MD   Encounter Date: 07/14/2021   PT End of Session - 07/14/21 1100     Visit Number 5    Number of Visits 17    Date for PT Re-Evaluation 08/26/21    Authorization Type Medicare    Progress Note Due on Visit 10    PT Start Time 1100    PT Stop Time 1142    PT Time Calculation (min) 42 min    Equipment Utilized During Treatment Gait belt    Activity Tolerance Patient tolerated treatment well    Behavior During Therapy WFL for tasks assessed/performed             Past Medical History:  Diagnosis Date   Anxiety    Arthritis    Clotting disorder ([place])    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)    History   Portal vein thrombosis    Rhinitis    Situational depression    Sleep apnea    uses CPAP every night   Tubular adenoma 12/10/2015   6 cecum polyps    Past Surgical History:  Procedure Laterality Date   CARDIAC CATHETERIZATION     greater 10 yrs ago, normal   CARPAL TUNNEL RELEASE     bilateral   COLONOSCOPY  06/2005   [facility]   ESOPHAGOGASTRODUODENOSCOPY (EGD) WITH PROPOFOL N/A 09/28/2016   Procedure: ESOPHAGOGASTRODUODENOSCOPY (EGD) WITH PROPOFOL;  Surgeon: Gatha Mayer, MD;  [facility];   Service: Endoscopy;  Laterality: N/A;   IR GENERIC HISTORICAL  12/12/2016   IR US GUIDE VASC ACCESS RIGHT 12/12/2016 [name], MD [facility]   IR GENERIC HISTORICAL  12/12/2016   IR VENOGRAM HEPATIC W HEMODYNAMIC EVALUATION 12/12/2016 [name], MD [facility]   IR GENERIC HISTORICAL  12/12/2016   IR TRANSCATHETER BX 12/12/2016 [name], MD [facility]   ROTATOR CUFF REPAIR     right   TENDON REPAIR     left arm   TONSILLECTOMY     TOTAL KNEE ARTHROPLASTY Right 2010   WISDOM TOOTH EXTRACTION      There were no vitals filed for this visit.   Subjective Assessment - 07/14/21 1103     Subjective Patient reports that shoulder is bothering him, sees a specialist next week for the dislocated surgery. Patient reports hip also continues to bother him. No new changes or complaints. No falls.    Pertinent History Anxiety, arthritis, CML, depression, diabetes, GERD, HTN, hyperlipidemia, insomina, macular degeneration, prior R TKA    Limitations Standing;Walking;House hold activities    How long can you sit comfortably? n/a    How long can you stand comfortably? has chronic back/hip pain    How  long can you walk comfortably? House to car only due to not feeling comfortable; will use RW in grocery store    Diagnostic tests Awaiting MRI results for R shoulder    Patient Stated Goals Improve balance and decrease falls/unsteadiness/dizziness; wife would like pt to be able to walk/stand straighter    Currently in Pain? Yes    Pain Score 2     Pain Location Shoulder    Pain Orientation Left    Pain Descriptors / Indicators Aching    Pain Type Chronic pain    Pain Onset More than a month ago    Pain Frequency Constant    Multiple Pain Sites Yes    Pain Score 4    Pain Location Hip    Pain Orientation Right;Left    Pain Descriptors / Indicators Aching    Pain Type Chronic pain    Pain Onset 1 to 4 weeks ago    Pain Frequency Intermittent    Aggravating Factors  movement;  walking    Pain Relieving Factors rest                [facility] Adult PT Treatment/Exercise - 07/14/21 0001       Ambulation/Gait   Ambulation/Gait Yes    Ambulation/Gait Assistance 5: Supervision    Ambulation/Gait Assistance Details throughout therapy gym with activities. Pt requesting to bring in walking sticks to next session and work with these devices.    Ambulation Distance (Feet) --   clinic distance   Assistive device None    Gait Pattern Step-through pattern;Lateral trunk lean to right;Lateral hip instability;Abducted - left;Abducted- right    Ambulation Surface Level;Indoor      Exercises   Exercises Other Exercises    Other Exercises  Completed AROM exercises including flex/ext x 10 reps, cervical rotation x 10 reps, and lateral felxion x 10 reps. PT providing tactile cues to avoid body/trunk movement. Compelted lateral flexion stretch to bilat directions x 30 seconds. PT educating on proper completion and providing handout. Patient demo increased challenge breaking head movement from body movement with standing activities vs. seated activities.             Vestibular Treatment/Exercise - 07/14/21 0001       Vestibular Treatment/Exercise   Vestibular Treatment Provided Gaze    Gaze Exercises X1 Viewing Horizontal;X1 Viewing Vertical      X1 Viewing Horizontal   Foot Position feet apart (patterned background)    Reps 2    Comments x 45 seconds      X1 Viewing Vertical   Foot Position feet apart (patterned background)    Reps 2    Comments x 45 seconds              Balance Exercises - 07/14/21 0001       Balance Exercises: Standing   Standing Eyes Closed Narrow base of support (BOS);Wide (BOA);Head turns;Foam/compliant surface;Limitations    Standing Eyes Closed Limitations standing with narrow BOS EC 3 x 30 seconds; then with wide BOS and EC added horizontal/veritcal head turns x 10 reps each direction.    Stepping Strategy  Anterior;Posterior;Foam/compliant surface;Limitations    Stepping Strategy Limitations x 10 reps anterior and posterior direction with BUE support, cues for step length. continue to demo increased challenge with step back on. require intermittent standing rest break due to fatigue.    Other Standing Exercises Comments intermittent rest breaks due to hip discomfort  Side Bend, Sitting    Sit, hand over top of head. Gently pull head to one side. Hold 30 seconds. Repeat 3 times per session. Do 1 sessions per day.   PT Education - 07/14/21 1145     Education Details Lateral Flexion Stretch    Person(s) Educated Patient    Methods Explanation;Demonstration;Handout    Comprehension Verbalized understanding;Returned demonstration              PT Short Term Goals - 07/06/21 0853       PT SHORT TERM GOAL #1   Title Pt will be independent with initial HEP    Time 4    Period Weeks    Status On-going    Target Date 07/29/21      PT SHORT TERM GOAL #2   Title Pt will report at least 25% improvement in his dizziness    Time 4    Period Weeks    Status On-going    Target Date 07/29/21      PT SHORT TERM GOAL #3   Title PT will assess and provide LTGs for dynamic gait    Time 2    Period Weeks    Status Achieved    Target Date 07/29/21      PT SHORT TERM GOAL #4   Title Pt will be able to maintain balance for at least 30 sec in all conditions of mCTSIB to demo improving sensory integration    Baseline M-CTSIB: situation 2 for 20.35 seconds, situation 4: 11 seconds    Time 4    Period Weeks    Status Revised    Target Date 07/15/21      PT SHORT TERM GOAL #5   Title Pt will have improved TUG time to </=12 sec to demo decreased fall risk    Baseline 14    Time 4    Period Weeks    Status On-going    Target Date 07/29/21               PT Long Term Goals - 07/06/21 0853       PT LONG TERM GOAL #1   Title Pt will be independent with final HEP  (all LTGs due 08/26/21)    Time 8    Period Weeks    Status On-going      PT LONG TERM GOAL #2   Title Pt will report >50% improvement with his dizziness    Time 8    Period Weeks    Status On-going      PT LONG TERM GOAL #3   Title Pt will be able to amb >1000' indoors and outdoors without a/d for improved community mobility    Baseline Currently self limiting himself from house to the car and to appointments    Time 8    Period Weeks    Status On-going      PT LONG TERM GOAL #4   Title Pt will have improved FOTO score to at least 57    Baseline 43    Time 8    Period Weeks    Status On-going      PT LONG TERM GOAL #5   Title Pt will have improved FGA score to at least 22/30 to demonstrate decreased risk of falls    Baseline 15/30    Time 8    Period Weeks    Status New    Target Date 08/26/21      Additional Long  Term Goals   Additional Long Term Goals Yes                   Plan - 07/14/21 1101     Clinical Impression Statement Today's skilled PT session focused on exercises to promote isolated neck ROM, as patient continues to tend to incorporate trunk/body rotation with activities. PT educating on stretching and AROM exercises with patient tolerating well. Rest of session focused on continued balance activities further challenging vestibular input.    Personal Factors and Comorbidities Age;Fitness;Comorbidity 1;Comorbidity 2;Comorbidity 3+    Comorbidities R TKA, Anxiety, arthritis, CML, depression, diabetes, GERD, HTN, hyperlipidemia, insomina, decreased L vision    Examination-Activity Limitations Bend;Stand;Stairs;Squat;Locomotion Level;Transfers    Examination-Participation Restrictions Community Activity;Yard Work;Cleaning    Stability/Clinical Decision Making Evolving/Moderate complexity    Rehab Potential Good    PT Frequency 2x / week    PT Duration 8 weeks    PT Treatment/Interventions ADLs/Self Care Home Management;Aquatic Therapy;Canalith  Repostioning;Electrical Stimulation;Cryotherapy;Iontophoresis 62m/ml Dexamethasone;Moist Heat;DME Instruction;Gait training;Stair training;Functional mobility training;Therapeutic activities;Therapeutic exercise;Balance training;Neuromuscular re-education;Manual techniques;Patient/family education;Passive range of motion;Dry needling;Taping;Vestibular;Spinal Manipulations    PT Next Visit Plan Patient to bring in his walking sticks. Please adjust for height, and begin gait training with these. Continue gaze stabilization, horizontal/vertical head movements, and balance exercises (specifically with eyes closed and on compliant surfaces). Continue neck stretches/AROM to increase movement without additional body movements. Continue strengthening as needed for core and hip stability.    PT Home Exercise Plan see patient instructions    Consulted and Agree with Plan of Care Patient;Family member/caregiver    Family Member Consulted Wife, Joy             Patient will benefit from skilled therapeutic intervention in order to improve the following deficits and impairments:  Abnormal gait, Difficulty walking, Dizziness, Decreased activity tolerance, Pain, Decreased range of motion, Decreased balance, Decreased mobility, Decreased strength, Postural dysfunction, Improper body mechanics, Hypomobility  Visit Diagnosis: Unsteadiness on feet  Difficulty in walking, not elsewhere classified  Dizziness and giddiness  Muscle weakness (generalized)     Problem List Patient Active Problem List   Diagnosis Date Noted   CML (chronic myelocytic leukemia) ([facility] 12/16/2020   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   OSA on CPAP 10/30/2017   Hypogonadism, male 10/30/2017   Iron deficiency anemia due to chronic blood loss 11/20/2016   Erosive gastritis with hemorrhage    Melena    Acute blood loss anemia 09/27/2016   Acute upper GI bleed 09/27/2016   Abnormal liver diagnostic imaging 06/22/2016    Liver cirrhosis secondary to NASH ([place] 05/03/2016   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 [address]Phone: [phone]  Fax:  [phone] Name: DDianna [name] MRN: [id number] of Birth: [birth year]

## 2021-07-14 NOTE — Patient Instructions (Signed)
Side Bend, Sitting    Sit, hand over top of head. Gently pull head to one side. Hold 30 seconds. Repeat 3 times per session. Do 1 sessions per day.  Copyright  VHI. All rights reserved.

## 2021-07-18 ENCOUNTER — Other Ambulatory Visit: Payer: Self-pay

## 2021-07-18 ENCOUNTER — Ambulatory Visit: Payer: Medicare Other | Attending: Internal Medicine | Admitting: Physical Therapy

## 2021-07-18 DIAGNOSIS — R262 Difficulty in walking, not elsewhere classified: Secondary | ICD-10-CM | POA: Diagnosis present

## 2021-07-18 DIAGNOSIS — R42 Dizziness and giddiness: Secondary | ICD-10-CM | POA: Diagnosis present

## 2021-07-18 DIAGNOSIS — M6281 Muscle weakness (generalized): Secondary | ICD-10-CM

## 2021-07-18 DIAGNOSIS — R2681 Unsteadiness on feet: Secondary | ICD-10-CM

## 2021-07-18 NOTE — Therapy (Signed)
[facility] [address] [place], [place], [address] Phone: [phone]   Fax:  [phone]  Physical Therapy Treatment  Patient Details  Name: James [name]. MRN: [id number] Date of Birth: [birth year] Referring Provider (PT): Jarome Matin, MD   Encounter Date: 07/18/2021   PT End of Session - 07/18/21 0920     Visit Number 6    Number of Visits 17    Date for PT Re-Evaluation 08/26/21    Authorization Type Medicare    Progress Note Due on Visit 10    PT Start Time 0925    PT Stop Time 1010    PT Time Calculation (min) 45 min    Equipment Utilized During Treatment Gait belt    Activity Tolerance Patient tolerated treatment well    Behavior During Therapy WFL for tasks assessed/performed             Past Medical History:  Diagnosis Date   Anxiety    Arthritis    Clotting disorder (HCC)    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)    History   Portal vein thrombosis    Rhinitis    Situational depression    Sleep apnea    uses CPAP every night   Tubular adenoma 12/10/2015   6 cecum polyps    Past Surgical History:  Procedure Laterality Date   CARDIAC CATHETERIZATION     greater 10 yrs ago, normal   CARPAL TUNNEL RELEASE     bilateral   COLONOSCOPY  06/2005   [facility] [place]   ESOPHAGOGASTRODUODENOSCOPY (EGD) WITH PROPOFOL N/A 09/28/2016   Procedure: ESOPHAGOGASTRODUODENOSCOPY (EGD) WITH PROPOFOL;  Surgeon: Iva Boop, MD;  Location: [facility];   Service: Endoscopy;  Laterality: N/A;   IR GENERIC HISTORICAL  12/12/2016   IR US GUIDE VASC ACCESS RIGHT 12/12/2016 Jolaine Click, MD [facility]   IR GENERIC HISTORICAL  12/12/2016   IR VENOGRAM HEPATIC W HEMODYNAMIC EVALUATION 12/12/2016 Jolaine Click, MD [facility]   IR GENERIC HISTORICAL  12/12/2016   IR TRANSCATHETER BX 12/12/2016 Jolaine Click, MD [facility]   ROTATOR CUFF REPAIR     right   TENDON REPAIR     left arm   TONSILLECTOMY     TOTAL KNEE ARTHROPLASTY Right 2010   WISDOM TOOTH EXTRACTION      There were no vitals filed for this visit.   Subjective Assessment - 07/18/21 0929     Subjective Nothing new to report. Pt reports forgetting to bring in his walking sticks.    Pertinent History Anxiety, arthritis, CML, depression, diabetes, GERD, HTN, hyperlipidemia, insomina, macular degeneration, prior R TKA    Limitations Standing;Walking;House hold activities    How long can you sit comfortably? n/a    How long can you stand comfortably? has chronic back/hip pain    How long can you walk comfortably? House to car only due to not feeling comfortable; will use RW in  grocery store    Diagnostic tests Awaiting MRI results for R shoulder    Patient Stated Goals Improve balance and decrease falls/unsteadiness/dizziness; wife would like pt to be able to walk/stand straighter    Pain Onset More than a month ago    Pain Onset 1 to 4 weeks ago                               [facility] Adult PT Treatment/Exercise - 07/18/21 0001       Exercises   Exercises Neck      Neck Exercises: Standing   Other Standing Exercises AROM: neck flexion/ext x10, rotations x10, lateral flexion x10      Neck Exercises: Supine   Lateral Flexion 10 reps    Other Supine Exercise Neck rotation x10      Manual Therapy   Manual Therapy Joint mobilization    Joint Mobilization cervical rotation x10; snags 3x5sec; lateral mobs x10             Vestibular Treatment/Exercise -  07/18/21 0001       X1 Viewing Horizontal   Foot Position feet together on foam    Reps 2    Comments x30 sec      X1 Viewing Vertical   Foot Position feet together on foam    Reps 2    Comments x30 sec                Balance Exercises - 07/18/21 0001       Balance Exercises: Standing   Standing Eyes Opened Foam/compliant surface;Wide (BOA)   body turns, head still 2x10; bowing with head still 2x10   Standing Eyes Closed Narrow base of support (BOS);Wide (BOA);Head turns;Foam/compliant surface;Limitations    Standing Eyes Closed Limitations standing with narrow BOS EC 3 x 30 seconds; added horizontal/veritcal head turns 2x 10 reps each direction.    Tandem Gait Forward;Upper extremity support;Limitations    Tandem Gait Limitations on firm surface x 3 laps down and back slow progression to single UE support   3 sec hold with tandem stance                PT Short Term Goals - 07/06/21 0853       PT SHORT TERM GOAL #1   Title Pt will be independent with initial HEP    Time 4    Period Weeks    Status On-going    Target Date 07/29/21      PT SHORT TERM GOAL #2   Title Pt will report at least 25% improvement in his dizziness    Time 4    Period Weeks    Status On-going    Target Date 07/29/21      PT SHORT TERM GOAL #3   Title PT will assess and provide LTGs for dynamic gait    Time 2    Period Weeks    Status Achieved    Target Date 07/29/21      PT SHORT TERM GOAL #4   Title Pt will be able to maintain balance for at least 30 sec in all conditions of mCTSIB to demo improving sensory integration    Baseline M-CTSIB: situation 2 for 20.35 seconds, situation 4: 11 seconds    Time 4    Period Weeks    Status Revised    Target Date 07/15/21      PT SHORT TERM GOAL #  5   Title Pt will have improved TUG time to </=12 sec to demo decreased fall risk    Baseline 14    Time 4    Period Weeks    Status On-going    Target Date 07/29/21                PT Long Term Goals - 07/06/21 0853       PT LONG TERM GOAL #1   Title Pt will be independent with final HEP (all LTGs due 08/26/21)    Time 8    Period Weeks    Status On-going      PT LONG TERM GOAL #2   Title Pt will report >50% improvement with his dizziness    Time 8    Period Weeks    Status On-going      PT LONG TERM GOAL #3   Title Pt will be able to amb >1000' indoors and outdoors without a/d for improved community mobility    Baseline Currently self limiting himself from house to the car and to appointments    Time 8    Period Weeks    Status On-going      PT LONG TERM GOAL #4   Title Pt will have improved FOTO score to at least 57    Baseline 43    Time 8    Period Weeks    Status On-going      PT LONG TERM GOAL #5   Title Pt will have improved FGA score to at least 22/30 to demonstrate decreased risk of falls    Baseline 15/30    Time 8    Period Weeks    Status New    Target Date 08/26/21      Additional Long Term Goals   Additional Long Term Goals Yes                   Plan - 07/18/21 1015     Clinical Impression Statement Pt demos improving neck ROM without additional body motion. Worked on performing body motion without excess neck movement. Provided manual therapy to improve pt's neck movement (looks stiffer with rotation to the right and L lateral side flexion). Continued to progress and challenge vestibular input on compliant surface -- pt with decreasing posterior LOBs.    Personal Factors and Comorbidities Age;Fitness;Comorbidity 1;Comorbidity 2;Comorbidity 3+    Comorbidities R TKA, Anxiety, arthritis, CML, depression, diabetes, GERD, HTN, hyperlipidemia, insomina, decreased L vision    Examination-Activity Limitations Bend;Stand;Stairs;Squat;Locomotion Level;Transfers    Examination-Participation Restrictions Community Activity;Yard Work;Cleaning    Stability/Clinical Decision Making Evolving/Moderate complexity    Rehab  Potential Good    PT Frequency 2x / week    PT Duration 8 weeks    PT Treatment/Interventions ADLs/Self Care Home Management;Aquatic Therapy;Canalith Repostioning;Electrical Stimulation;Cryotherapy;Iontophoresis 4mg /ml Dexamethasone;Moist Heat;DME Instruction;Gait training;Stair training;Functional mobility training;Therapeutic activities;Therapeutic exercise;Balance training;Neuromuscular re-education;Manual techniques;Patient/family education;Passive range of motion;Dry needling;Taping;Vestibular;Spinal Manipulations    PT Next Visit Plan Patient to bring in his walking sticks. Please adjust for height, and begin gait training with these. Continue gaze stabilization, horizontal/vertical head movements, and balance exercises (specifically with eyes closed and on compliant surfaces). Continue neck stretches/AROM to increase movement without additional body movements. Continue strengthening as needed for core and hip stability.    PT Home Exercise Plan Access Code: [email or web address] -- go over palloff press    Consulted and Agree with Plan of Care Patient    Family Member Consulted --  Patient will benefit from skilled therapeutic intervention in order to improve the following deficits and impairments:  Abnormal gait, Difficulty walking, Dizziness, Decreased activity tolerance, Pain, Decreased range of motion, Decreased balance, Decreased mobility, Decreased strength, Postural dysfunction, Improper body mechanics, Hypomobility  Visit Diagnosis: Unsteadiness on feet  Difficulty in walking, not elsewhere classified  Dizziness and giddiness  Muscle weakness (generalized)     Problem List Patient Active Problem List   Diagnosis Date Noted   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   OSA on CPAP 10/30/2017   Hypogonadism, male 10/30/2017   Iron deficiency anemia due to chronic blood loss 11/20/2016   Erosive gastritis with  hemorrhage    Melena    Acute blood loss anemia 09/27/2016   Acute upper GI bleed 09/27/2016   Abnormal liver diagnostic imaging 06/22/2016   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 [place], [place], [address] Phone: [phone]   Fax:  [phone]  Name: James [name]. MRN: [id number] Date of Birth: 18-Jun-1946

## 2021-07-21 ENCOUNTER — Ambulatory Visit: Payer: Medicare Other | Admitting: Physical Therapy

## 2021-07-25 ENCOUNTER — Other Ambulatory Visit: Payer: Self-pay

## 2021-07-25 ENCOUNTER — Ambulatory Visit: Payer: Medicare Other | Admitting: Physical Therapy

## 2021-07-25 DIAGNOSIS — R42 Dizziness and giddiness: Secondary | ICD-10-CM

## 2021-07-25 DIAGNOSIS — R262 Difficulty in walking, not elsewhere classified: Secondary | ICD-10-CM

## 2021-07-25 DIAGNOSIS — R2681 Unsteadiness on feet: Secondary | ICD-10-CM

## 2021-07-25 DIAGNOSIS — M6281 Muscle weakness (generalized): Secondary | ICD-10-CM

## 2021-07-25 NOTE — Therapy (Signed)
[facility] [address] [place], [place], [address] Phone: [phone]   Fax:  [phone]  Physical Therapy Treatment  Patient Details  Name: James [name]. MRN: [id number] Date of Birth: July 30, 1946 Referring Provider (PT): Jarome Matin, MD   Encounter Date: 07/25/2021   PT End of Session - 07/25/21 0935     Visit Number 7    Number of Visits 17    Date for PT Re-Evaluation 08/26/21    Authorization Type Medicare    Progress Note Due on Visit 10    PT Start Time 0935    PT Stop Time 1015    PT Time Calculation (min) 40 min    Equipment Utilized During Treatment Gait belt    Activity Tolerance Patient tolerated treatment well    Behavior During Therapy WFL for tasks assessed/performed             Past Medical History:  Diagnosis Date   Anxiety    Arthritis    Clotting disorder (HCC)    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)    History   Portal vein thrombosis    Rhinitis    Situational depression    Sleep apnea    uses CPAP every night   Tubular adenoma 12/10/2015   6 cecum polyps    Past Surgical History:  Procedure Laterality Date   CARDIAC CATHETERIZATION     greater 10 yrs ago, normal   CARPAL TUNNEL RELEASE     bilateral   COLONOSCOPY  06/2005   [facility] [place]   ESOPHAGOGASTRODUODENOSCOPY (EGD) WITH PROPOFOL N/A 09/28/2016   Procedure: ESOPHAGOGASTRODUODENOSCOPY (EGD) WITH PROPOFOL;  Surgeon: Iva Boop, MD;  Location: [facility];   Service: Endoscopy;  Laterality: N/A;   IR GENERIC HISTORICAL  12/12/2016   IR US GUIDE VASC ACCESS RIGHT 12/12/2016 Jolaine Click, MD [facility]   IR GENERIC HISTORICAL  12/12/2016   IR VENOGRAM HEPATIC W HEMODYNAMIC EVALUATION 12/12/2016 Jolaine Click, MD [facility]   IR GENERIC HISTORICAL  12/12/2016   IR TRANSCATHETER BX 12/12/2016 Jolaine Click, MD [facility]   ROTATOR CUFF REPAIR     right   TENDON REPAIR     left arm   TONSILLECTOMY     TOTAL KNEE ARTHROPLASTY Right 2010   WISDOM TOOTH EXTRACTION      There were no vitals filed for this visit.   Subjective Assessment - 07/25/21 0938     Subjective Pt reports some difficulty with noting if it's joints giving him issues. Pt maybe notes a mild decrease in his dizziness (has not felt it today) but not as much as a 25% decrease. He reports feeling it on Friday when outside but cannot recall the situation he was in. Pt states he lost his exercise paper and would like a new print out.    Pertinent History Anxiety, arthritis, CML, depression, diabetes, GERD, HTN, hyperlipidemia, insomina, macular degeneration, prior R TKA  Limitations Standing;Walking;House hold activities    How long can you sit comfortably? n/a    How long can you stand comfortably? has chronic back/hip pain    How long can you walk comfortably? House to car only due to not feeling comfortable; will use RW in grocery store    Diagnostic tests Awaiting MRI results for R shoulder    Patient Stated Goals Improve balance and decrease falls/unsteadiness/dizziness; wife would like pt to be able to walk/stand straighter    Pain Onset More than a month ago    Pain Onset 1 to 4 weeks ago                               [facility] Adult PT Treatment/Exercise - 07/25/21 0001       Knee/Hip Exercises: Standing   Heel Raises 2 sets;10 reps;Both    Other Standing Knee Exercises attempted palloff press this session but bothered pt's shoulder too much              Vestibular Treatment/Exercise - 07/25/21 0001       Vestibular Treatment/Exercise   Vestibular Treatment Provided Gaze;Habituation    Gaze Exercises Eye/Head Exercise Vertical;Eye/Head Exercise Horizontal      X1 Viewing Horizontal   Foot Position feet together on foam    Reps 2    Comments x30 sec      X1 Viewing Vertical   Foot Position feet together on foam    Reps 2    Comments x30 sec      Eye/Head Exercise Horizontal   Foot Position Feet together on foam    Comments Saccades 2x30 sec; smooth pursuit 2x30 sec      Eye/Head Exercise Vertical   Foot Position feet together on foam    Reps 2    Comments Saccades 2x30 sec; smooth pursuit 2x30 sec                Balance Exercises - 07/25/21 0001       Balance Exercises: Standing   Standing Eyes Opened Foam/compliant surface;Wide (BOA);30 secs;Head turns;Narrow base of support (BOS)    Standing Eyes Opened Limitations worked on static balance with head turned up 3x10 sec    Standing Eyes Closed Narrow base of support (BOS);Wide (BOA);Head turns;Foam/compliant surface;Limitations;2 reps;30 secs    Standing Eyes Closed Limitations static standing 2x30 sec on foam (only able to maintain 18 sec before needing UE support)    Rockerboard Anterior/posterior;EO;EC   EO forward/backward 2x10; EO static balance x30 sec; EC static balance x30 sec                PT Short Term Goals - 07/25/21 1656       PT SHORT TERM GOAL #1   Title Pt will be independent with initial HEP    Baseline Lost paper but states he's been trying the balance exercises 07/25/21    Time 4    Period Weeks    Status Partially Met    Target Date 07/29/21      PT SHORT TERM GOAL #2   Title Pt will report at least 25% improvement in his dizziness    Baseline Pt states not as much as 25% improvement but notes last dizzy episode last Friday 07/25/21    Time 4    Period Weeks    Status Not Met    Target Date 07/29/21      PT SHORT  TERM GOAL #3   Title PT will assess and provide LTGs for dynamic gait    Time 2    Period Weeks    Status Achieved    Target Date 07/29/21      PT SHORT TERM GOAL #4   Title Pt will be able to maintain balance for at least 30 sec in all conditions of mCTSIB to demo improving sensory integration    Baseline M-CTSIB: situation 2 for 20.35 seconds, situation 4: 11 seconds; situation 2 for 30 sec, situation 4 for 18 sec 07/25/21    Time 4    Period Weeks    Status Partially Met    Target Date 07/15/21      PT SHORT TERM GOAL #5   Title Pt will have improved TUG time to </=12 sec to demo decreased fall risk    Baseline 14; 12 sec on 07/25/21 but minor LOB during turn    Time 4    Period Weeks    Status Achieved    Target Date 07/29/21               PT Long Term Goals - 07/06/21 0853       PT LONG TERM GOAL #1   Title Pt will be independent with final HEP (all LTGs due 08/26/21)    Time 8    Period Weeks    Status On-going      PT LONG TERM GOAL #2   Title Pt will report >50% improvement with his dizziness    Time 8    Period Weeks    Status On-going      PT LONG TERM GOAL #3   Title Pt will be able to amb >1000' indoors and outdoors without a/d for improved community mobility    Baseline Currently self limiting himself from house to the car and to appointments    Time 8    Period Weeks    Status On-going      PT LONG TERM GOAL #4   Title Pt will have improved FOTO score to at least 57    Baseline 43    Time 8    Period Weeks    Status On-going      PT LONG TERM GOAL #5   Title Pt will have improved FGA score to at least 22/30 to demonstrate decreased risk of falls    Baseline 15/30    Time 8    Period Weeks    Status New    Target Date 08/26/21      Additional Long Term Goals   Additional Long Term Goals Yes                   Plan - 07/25/21 1654     Clinical Impression Statement Re-checked pt's STG. He has met 3 and 5. He has partially met 1  and 4. Pt still demos decreased vestibular integration with his balance during situation 4 of mCTSIB. Progressed pt to VOR exercises and oculomotor exercises on foam without any increase in dizziness. Pt notes unsteadiness/decreased balance -- notably has posterior LOB when looking up. Core strengthening exercises in standing limited due to general joint pain in shoulders, hips, and knees. Pt notes he is to get shoulder surgery. Pt will note circumstances of dizzy episodes this week so he can report back to PT.    Personal Factors and Comorbidities Age;Fitness;Comorbidity 1;Comorbidity 2;Comorbidity 3+    Comorbidities R TKA, Anxiety, arthritis, CML, depression, diabetes,  GERD, HTN, hyperlipidemia, insomina, decreased L vision    Examination-Activity Limitations Bend;Stand;Stairs;Squat;Locomotion Level;Transfers    Examination-Participation Restrictions Community Activity;Yard Work;Cleaning    Stability/Clinical Decision Making Evolving/Moderate complexity    Rehab Potential Good    PT Frequency 2x / week    PT Duration 8 weeks    PT Treatment/Interventions ADLs/Self Care Home Management;Aquatic Therapy;Canalith Repostioning;Electrical Stimulation;Cryotherapy;Iontophoresis 4mg /ml Dexamethasone;Moist Heat;DME Instruction;Gait training;Stair training;Functional mobility training;Therapeutic activities;Therapeutic exercise;Balance training;Neuromuscular re-education;Manual techniques;Patient/family education;Passive range of motion;Dry needling;Taping;Vestibular;Spinal Manipulations    PT Next Visit Plan Patient to bring in his walking sticks. Please adjust for height, and begin gait training with these. Continue gaze stabilization, horizontal/vertical head movements, and balance exercises (specifically with eyes closed and on compliant surfaces). Continue neck stretches/AROM to increase movement without additional body movements. Continue strengthening as needed for core and hip stability.    PT Home  Exercise Plan Access Code: [email or web address]    Consulted and Agree with Plan of Care Patient             Patient will benefit from skilled therapeutic intervention in order to improve the following deficits and impairments:  Abnormal gait, Difficulty walking, Dizziness, Decreased activity tolerance, Pain, Decreased range of motion, Decreased balance, Decreased mobility, Decreased strength, Postural dysfunction, Improper body mechanics, Hypomobility  Visit Diagnosis: Unsteadiness on feet  Difficulty in walking, not elsewhere classified  Dizziness and giddiness  Muscle weakness (generalized)     Problem List Patient Active Problem List   Diagnosis Date Noted   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   OSA on CPAP 10/30/2017   Hypogonadism, male 10/30/2017   Iron deficiency anemia due to chronic blood loss 11/20/2016   Erosive gastritis with hemorrhage    Melena    Acute blood loss anemia 09/27/2016   Acute upper GI bleed 09/27/2016   Abnormal liver diagnostic imaging 06/22/2016   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/03/2016   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   Portal vein thrombosis 04/29/2016   Diabetes mellitus (HCC) 04/29/2016   Leukocytosis 04/29/2016   Dyspnea 12/31/2015   Essential hypertension 12/31/2015   Hyperlipidemia 12/31/2015   Nocturia more than twice per night 11/11/2014   Severe obesity (BMI >= 40) (HCC) 11/11/2014   Hypersomnia with sleep apnea 11/11/2014   Polycythemia vera (HCC) 11/29/2011    Termaine Roupp April Ma L Marlean Mortell PT, DPT 07/25/2021, 5:04 PM  [facility] [address] [place], [place], [address] Phone: [phone]   Fax:  [phone]  Name: James [name]. MRN: [id number] Date of Birth: 17-May-1946

## 2021-07-28 ENCOUNTER — Other Ambulatory Visit: Payer: Self-pay

## 2021-07-28 ENCOUNTER — Ambulatory Visit: Payer: Medicare Other | Admitting: Physical Therapy

## 2021-07-28 DIAGNOSIS — R2681 Unsteadiness on feet: Secondary | ICD-10-CM

## 2021-07-28 DIAGNOSIS — M6281 Muscle weakness (generalized): Secondary | ICD-10-CM

## 2021-07-28 DIAGNOSIS — R262 Difficulty in walking, not elsewhere classified: Secondary | ICD-10-CM

## 2021-07-28 DIAGNOSIS — R42 Dizziness and giddiness: Secondary | ICD-10-CM

## 2021-07-28 NOTE — Therapy (Signed)
[facility] [address] [place], [place], [address] Phone: [phone]   Fax:  [phone]  Physical Therapy Treatment  Patient Details  Name: James [name]. MRN: [id number] Date of Birth: 04/12/1946 Referring Provider (PT): Jarome Matin, MD   Encounter Date: 07/28/2021   PT End of Session - 07/28/21 1503     Visit Number 8    Number of Visits 17    Date for PT Re-Evaluation 08/26/21    Authorization Type Medicare    Progress Note Due on Visit 10    PT Start Time 1405    PT Stop Time 1445    PT Time Calculation (min) 40 min    Activity Tolerance Patient tolerated treatment well    Behavior During Therapy [facility] for tasks assessed/performed             Past Medical History:  Diagnosis Date   Anxiety    Arthritis    Clotting disorder (HCC)    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)    History   Portal vein thrombosis    Rhinitis    Situational depression    Sleep apnea    uses CPAP every night   Tubular adenoma 12/10/2015   6 cecum polyps    Past Surgical History:  Procedure Laterality Date   CARDIAC CATHETERIZATION     greater 10 yrs ago, normal   CARPAL TUNNEL RELEASE     bilateral   COLONOSCOPY  06/2005   [facility] [place]   ESOPHAGOGASTRODUODENOSCOPY (EGD) WITH PROPOFOL N/A 09/28/2016   Procedure: ESOPHAGOGASTRODUODENOSCOPY (EGD) WITH PROPOFOL;  Surgeon: Iva Boop, MD;  Location: [facility];  Service: Endoscopy;  Laterality: N/A;   IR GENERIC  HISTORICAL  12/12/2016   IR US GUIDE VASC ACCESS RIGHT 12/12/2016 Jolaine Click, MD [facility]   IR GENERIC HISTORICAL  12/12/2016   IR VENOGRAM HEPATIC W HEMODYNAMIC EVALUATION 12/12/2016 Jolaine Click, MD [facility]   IR GENERIC HISTORICAL  12/12/2016   IR TRANSCATHETER BX 12/12/2016 Jolaine Click, MD [facility]   ROTATOR CUFF REPAIR     right   TENDON REPAIR     left arm   TONSILLECTOMY     TOTAL KNEE ARTHROPLASTY Right 2010   WISDOM TOOTH EXTRACTION      There were no vitals filed for this visit.   Subjective Assessment - 07/28/21 1407     Subjective Pt reports some knee pain/soreness limiting him today. Pt notes that dizziness has been occurring while bending forward in sitting. Has not been able to practice his exercises with eyes closed due to his knee pain. Pt reports he is to get surgery for his shoulder Sept 15. Pt will have additional appointments on  Aug 25th and 26th.    Pertinent History Anxiety, arthritis, CML, depression, diabetes, GERD, HTN, hyperlipidemia, insomina, macular degeneration, prior R TKA    Limitations Standing;Walking;House hold activities    How long can you sit  comfortably? n/a    How long can you stand comfortably? has chronic back/hip pain    How long can you walk comfortably? House to car only due to not feeling comfortable; will use RW in grocery store    Diagnostic tests Awaiting MRI results for R shoulder    Patient Stated Goals Improve balance and decrease falls/unsteadiness/dizziness; wife would like pt to be able to walk/stand straighter    Pain Onset More than a month ago    Pain Onset 1 to 4 weeks ago                                Vestibular Treatment/Exercise - 07/28/21 0001       Vestibular Treatment/Exercise   Habituation Exercises Comment      X1 Viewing Horizontal   Foot Position Sitting on dynadisk    Comments x30 sec      X1 Viewing Vertical   Foot Position Sitting on dynadisk    Comments x30 sec       Eye/Head Exercise Horizontal   Foot Position Sitting on dynadisk    Comments 2 targets body rotations 3x10      Eye/Head Exercise Vertical   Time --   Forward bend 3x10 with mild dizziness sitting on dynadisk. Rotation 3x10 sitting on dynadisk   Comments 2 targets body bend forward/backward 3x10; standing head nods looking at a floor and tall target x10; standing bowing forward and up looking at a floor and tall target 3x10                Balance Exercises - 07/28/21 0001       Balance Exercises: Standing   Other Standing Exercises Sitting eyes closed on dynadisk: 2x20 sec each head nods and then head turns                 PT Short Term Goals - 07/25/21 1656       PT SHORT TERM GOAL #1   Title Pt will be independent with initial HEP    Baseline Lost paper but states he's been trying the balance exercises 07/25/21    Time 4    Period Weeks    Status Partially Met    Target Date 07/29/21      PT SHORT TERM GOAL #2   Title Pt will report at least 25% improvement in his dizziness    Baseline Pt states not as much as 25% improvement but notes last dizzy episode last Friday 07/25/21    Time 4    Period Weeks    Status Not Met    Target Date 07/29/21      PT SHORT TERM GOAL #3   Title PT will assess and provide LTGs for dynamic gait    Time 2    Period Weeks    Status Achieved    Target Date 07/29/21      PT SHORT TERM GOAL #4   Title Pt will be able to maintain balance for at least 30 sec in all conditions of mCTSIB to demo improving sensory integration    Baseline M-CTSIB: situation 2 for 20.35 seconds, situation 4: 11 seconds; situation 2 for 30 sec, situation 4 for 18 sec 07/25/21    Time 4    Period Weeks    Status Partially Met    Target Date 07/15/21      PT SHORT TERM GOAL #5   Title  Pt will have improved TUG time to </=12 sec to demo decreased fall risk    Baseline 14; 12 sec on 07/25/21 but minor LOB during turn    Time 4    Period Weeks     Status Achieved    Target Date 07/29/21               PT Long Term Goals - 07/06/21 0853       PT LONG TERM GOAL #1   Title Pt will be independent with final HEP (all LTGs due 08/26/21)    Time 8    Period Weeks    Status On-going      PT LONG TERM GOAL #2   Title Pt will report >50% improvement with his dizziness    Time 8    Period Weeks    Status On-going      PT LONG TERM GOAL #3   Title Pt will be able to amb >1000' indoors and outdoors without a/d for improved community mobility    Baseline Currently self limiting himself from house to the car and to appointments    Time 8    Period Weeks    Status On-going      PT LONG TERM GOAL #4   Title Pt will have improved FOTO score to at least 57    Baseline 43    Time 8    Period Weeks    Status On-going      PT LONG TERM GOAL #5   Title Pt will have improved FGA score to at least 22/30 to demonstrate decreased risk of falls    Baseline 15/30    Time 8    Period Weeks    Status New    Target Date 08/26/21      Additional Long Term Goals   Additional Long Term Goals Yes                   Plan - 07/28/21 1457     Clinical Impression Statement Treatment session focused primarily on habituation exercises. Most of pt's dizziness occurs with bending forward. Limited exercises to sitting only due to pt reporting increased knee pain/instability today. Performed seated balance exercises with no LOB with pt sitting on dynadisk. Provided pt new habituation exercises with forward bending + rotation and forward bending with diagonal targets. Pt is to continue to try and work on his standing balance exercises when his knees tolerate it.    Personal Factors and Comorbidities Age;Fitness;Comorbidity 1;Comorbidity 2;Comorbidity 3+    Comorbidities R TKA, Anxiety, arthritis, CML, depression, diabetes, GERD, HTN, hyperlipidemia, insomina, decreased L vision    Examination-Activity Limitations  Bend;Stand;Stairs;Squat;Locomotion Level;Transfers    Examination-Participation Restrictions Community Activity;Yard Work;Cleaning    Stability/Clinical Decision Making Evolving/Moderate complexity    Rehab Potential Good    PT Frequency 2x / week    PT Duration 8 weeks    PT Treatment/Interventions ADLs/Self Care Home Management;Aquatic Therapy;Canalith Repostioning;Electrical Stimulation;Cryotherapy;Iontophoresis 4mg /ml Dexamethasone;Moist Heat;DME Instruction;Gait training;Stair training;Functional mobility training;Therapeutic activities;Therapeutic exercise;Balance training;Neuromuscular re-education;Manual techniques;Patient/family education;Passive range of motion;Dry needling;Taping;Vestibular;Spinal Manipulations    PT Next Visit Plan Patient has yet to bring in his walking sticks. Please adjust for height, and begin gait training with these. Progress habituation exercises and balance exercises (specifically with eyes closed and on compliant surfaces). Continue neck stretches/AROM to increase movement without additional body movements. Continue strengthening as needed for core and hip stability.    PT Home Exercise Plan Standing on pillow eyes closed;  forward bend with body rotation looking at 2 targets 2x10; forward bend with diagonal body movements looking at 2 targets 2x10    Consulted and Agree with Plan of Care Patient             Patient will benefit from skilled therapeutic intervention in order to improve the following deficits and impairments:  Abnormal gait, Difficulty walking, Dizziness, Decreased activity tolerance, Pain, Decreased range of motion, Decreased balance, Decreased mobility, Decreased strength, Postural dysfunction, Improper body mechanics, Hypomobility  Visit Diagnosis: Unsteadiness on feet  Difficulty in walking, not elsewhere classified  Dizziness and giddiness  Muscle weakness (generalized)     Problem List Patient Active Problem List   Diagnosis  Date Noted   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   OSA on CPAP 10/30/2017   Hypogonadism, male 10/30/2017   Iron deficiency anemia due to chronic blood loss 11/20/2016   Erosive gastritis with hemorrhage    Melena    Acute blood loss anemia 09/27/2016   Acute upper GI bleed 09/27/2016   Abnormal liver diagnostic imaging 06/22/2016   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 [address] [place], [place], [address] Phone: [phone]   Fax:  [phone]  Name: Chukwuemeka Carra. MRN: [id number] Date of Birth: 03-17-1946

## 2021-08-01 ENCOUNTER — Ambulatory Visit: Payer: Medicare Other

## 2021-08-01 ENCOUNTER — Other Ambulatory Visit: Payer: Self-pay

## 2021-08-01 DIAGNOSIS — R2681 Unsteadiness on feet: Secondary | ICD-10-CM

## 2021-08-01 DIAGNOSIS — M6281 Muscle weakness (generalized): Secondary | ICD-10-CM

## 2021-08-01 DIAGNOSIS — R42 Dizziness and giddiness: Secondary | ICD-10-CM

## 2021-08-01 DIAGNOSIS — R262 Difficulty in walking, not elsewhere classified: Secondary | ICD-10-CM

## 2021-08-01 NOTE — Therapy (Signed)
[facility] [address] [place], [place], [address] Phone: [phone]   Fax:  [phone]  Physical Therapy Treatment  Patient Details  Name: James [name]. MRN: [id number] Date of Birth: 07/02/1946 Referring Provider (PT): Leanna Battles, MD   Encounter Date: 08/01/2021   PT End of Session - 08/01/21 0845     Visit Number 9    Number of Visits 17    Date for PT Re-Evaluation 08/26/21    Authorization Type Medicare    Progress Note Due on Visit 10    PT Start Time 0845    PT Stop Time 0930    PT Time Calculation (min) 45 min    Equipment Utilized During Treatment Gait belt    Activity Tolerance Patient tolerated treatment well    Behavior During Therapy WFL for tasks assessed/performed             Past Medical History:  Diagnosis Date   Anxiety    Arthritis    Clotting disorder ([place])    CML (chronic myelocytic leukemia) ([place]) 12/16/2020   Depression    DM (diabetes mellitus) ([place])    GERD (gastroesophageal reflux disease)    Hiatal hernia    History of shingles 04/03/2014   HTN (hypertension)    Hyperlipidemia    Insomnia    resolved by using CPAP   Iron deficiency anemia due to chronic blood loss 11/20/2016   Macular degeneration    Nocturia more than twice per night 11/11/2014   For 6 month, 5 nocturias a night.    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)    History   Portal vein thrombosis    Rhinitis    Situational depression    Sleep apnea    uses CPAP every night   Tubular adenoma 12/10/2015   6 cecum polyps    Past Surgical History:  Procedure Laterality Date   CARDIAC CATHETERIZATION     greater 10 yrs ago, normal   CARPAL TUNNEL RELEASE     bilateral   COLONOSCOPY  06/2005   [facility] [place]   ESOPHAGOGASTRODUODENOSCOPY (EGD) WITH PROPOFOL N/A 09/28/2016   Procedure: ESOPHAGOGASTRODUODENOSCOPY (EGD) WITH PROPOFOL;  Surgeon: Gatha Mayer, MD;  [facility];   Service: Endoscopy;  Laterality: N/A;   IR GENERIC HISTORICAL  12/12/2016   IR US GUIDE VASC ACCESS RIGHT 12/12/2016 [name], MD [facility]   IR GENERIC HISTORICAL  12/12/2016   IR VENOGRAM HEPATIC W HEMODYNAMIC EVALUATION 12/12/2016 [name], MD [facility]   IR GENERIC HISTORICAL  12/12/2016   IR TRANSCATHETER BX 12/12/2016 [name], MD [facility]   ROTATOR CUFF REPAIR     right   TENDON REPAIR     left arm   TONSILLECTOMY     TOTAL KNEE ARTHROPLASTY Right 2010   WISDOM TOOTH EXTRACTION      There were no vitals filed for this visit.   Subjective Assessment - 08/01/21 0848     Subjective Patient reports knee feels better today. Just some slight pain in posterior hip/back. No falls.    Pertinent History Anxiety, arthritis, CML, depression, diabetes, GERD, HTN, hyperlipidemia, insomina, macular degeneration, prior R TKA    Limitations Standing;Walking;House hold activities    How long can you sit comfortably? n/a    How long can you stand comfortably? has chronic back/hip pain    How long can you walk comfortably? House to car only due to not feeling comfortable; will use  RW in grocery store    Diagnostic tests Awaiting MRI results for R shoulder    Patient Stated Goals Improve balance and decrease falls/unsteadiness/dizziness; wife would like pt to be able to walk/stand straighter    Currently in Pain? Yes    Pain Score 1     Pain Location Back    Pain Orientation Posterior    Pain Descriptors / Indicators Aching    Pain Onset More than a month ago    Pain Onset 1 to 4 weeks ago                               [facility] Adult PT Treatment/Exercise - 08/01/21 0001       Ambulation/Gait   Ambulation/Gait Yes    Ambulation/Gait Assistance 5: Supervision    Ambulation/Gait Assistance Details completed ambulation x 115 ft with PT providing cues for improved step length and improved hip/knee flexion to promote foot clearance.    Ambulation  Distance (Feet) 115 Feet   plus additional clinic distance   Assistive device None    Gait Pattern Step-through pattern;Lateral trunk lean to right;Lateral hip instability;Abducted - left;Abducted- right    Ambulation Surface Level;Indoor    Gait Comments PT educating to bring walking sticks into future session so we can practice.      High Level Balance   High Level Balance Activities Negotiating over obstacles    High Level Balance Comments completed forward stepping over hurdles step to pattern x 3 laps down and back, then progressed to lateral side stepping down and back x 3 laps, cues for step length and control. Completed gait with high level balance including gait with horiz/vertical head turns 2 x 50' each. Increased challenge with vertical > horizontal, cues for upright posture.      Neuro Re-ed    Neuro Re-ed Details  completed habituation to bending from seated positoin x  8 reps with comenpsatory saccades, then progressed to standing and completed 2 x 5 reps. no dizziness today.             Vestibular Treatment/Exercise - 08/01/21 0001       X1 Viewing Horizontal   Foot Position standing feet together    Reps 2    Comments x 60 seconds; no dizziness      X1 Viewing Vertical   Foot Position standing feet together    Reps 2    Comments x 60 seconds; no dizziness                Balance Exercises - 08/01/21 0001       Balance Exercises: Standing   SLS with Vectors Foam/compliant surface    SLS with Vectors Limitations standing on airex in front of steps, completed alternating toe taps to 1st step 2 x 10 reps. cues for slowed control movement to promote SLS and balance, intermittent UE suppot and CGA from PT.    Stepping Strategy Posterior;Anterior;Foam/compliant surface;10 reps;Limitations    Stepping Strategy Limitations x 10 reps anterior and posterior direction with BUE support on red balance, cues for step length. continue to demo increased challenge with  step back on. require intermittent standing rest break due to fatigue.    Balance Beam standing across red balance beam without UE support completed horizontal/vertical head turns x 10 reps.               PT Education - 08/01/21 9563  Education Details Bring Walking Sticks into [employer]) Educated Patient    Methods Explanation    Comprehension Verbalized understanding              PT Short Term Goals - 07/25/21 1656       PT SHORT TERM GOAL #1   Title Pt will be independent with initial HEP    Baseline Lost paper but states he's been trying the balance exercises 07/25/21    Time 4    Period Weeks    Status Partially Met    Target Date 07/29/21      PT SHORT TERM GOAL #2   Title Pt will report at least 25% improvement in his dizziness    Baseline Pt states not as much as 25% improvement but notes last dizzy episode last Friday 07/25/21    Time 4    Period Weeks    Status Not Met    Target Date 07/29/21      PT SHORT TERM GOAL #3   Title PT will assess and provide LTGs for dynamic gait    Time 2    Period Weeks    Status Achieved    Target Date 07/29/21      PT SHORT TERM GOAL #4   Title Pt will be able to maintain balance for at least 30 sec in all conditions of mCTSIB to demo improving sensory integration    Baseline M-CTSIB: situation 2 for 20.35 seconds, situation 4: 11 seconds; situation 2 for 30 sec, situation 4 for 18 sec 07/25/21    Time 4    Period Weeks    Status Partially Met    Target Date 07/15/21      PT SHORT TERM GOAL #5   Title Pt will have improved TUG time to </=12 sec to demo decreased fall risk    Baseline 14; 12 sec on 07/25/21 but minor LOB during turn    Time 4    Period Weeks    Status Achieved    Target Date 07/29/21               PT Long Term Goals - 07/06/21 0853       PT LONG TERM GOAL #1   Title Pt will be independent with final HEP (all LTGs due 08/26/21)    Time 8    Period Weeks    Status On-going       PT LONG TERM GOAL #2   Title Pt will report >50% improvement with his dizziness    Time 8    Period Weeks    Status On-going      PT LONG TERM GOAL #3   Title Pt will be able to amb >1000' indoors and outdoors without a/d for improved community mobility    Baseline Currently self limiting himself from house to the car and to appointments    Time 8    Period Weeks    Status On-going      PT LONG TERM GOAL #4   Title Pt will have improved FOTO score to at least 57    Baseline 43    Time 8    Period Weeks    Status On-going      PT LONG TERM GOAL #5   Title Pt will have improved FGA score to at least 22/30 to demonstrate decreased risk of falls    Baseline 15/30    Time 8    Period Weeks  Status New    Target Date 08/26/21      Additional Long Term Goals   Additional Long Term Goals Yes                   Plan - 08/01/21 1027     Clinical Impression Statement Patient able to tolerate standing activities more today due to reduced knee pain. Patietn had no dizziness with habituation or VOR today demonstrating significant improvements. Conitnued balance activites focused on complaint surface and improved step lenth. Will continue per POC.    Personal Factors and Comorbidities Age;Fitness;Comorbidity 1;Comorbidity 2;Comorbidity 3+    Comorbidities R TKA, Anxiety, arthritis, CML, depression, diabetes, GERD, HTN, hyperlipidemia, insomina, decreased L vision    Examination-Activity Limitations Bend;Stand;Stairs;Squat;Locomotion Level;Transfers    Examination-Participation Restrictions Community Activity;Yard Work;Cleaning    Stability/Clinical Decision Making Evolving/Moderate complexity    Rehab Potential Good    PT Frequency 2x / week    PT Duration 8 weeks    PT Treatment/Interventions ADLs/Self Care Home Management;Aquatic Therapy;Canalith Repostioning;Electrical Stimulation;Cryotherapy;Iontophoresis 51m/ml Dexamethasone;Moist Heat;DME Instruction;Gait training;Stair  training;Functional mobility training;Therapeutic activities;Therapeutic exercise;Balance training;Neuromuscular re-education;Manual techniques;Patient/family education;Passive range of motion;Dry needling;Taping;Vestibular;Spinal Manipulations    PT Next Visit Plan Patient has yet to bring in his walking sticks. Please adjust for height, and begin gait training with these. Progress habituation exercises and balance exercises (specifically with eyes closed and on compliant surfaces). Continue neck stretches/AROM to increase movement without additional body movements. Continue strengthening as needed for core and hip stability.    PT Home Exercise Plan Standing on pillow eyes closed; forward bend with body rotation looking at 2 targets 2x10; forward bend with diagonal body movements looking at 2 targets 2x10    Consulted and Agree with Plan of Care Patient             Patient will benefit from skilled therapeutic intervention in order to improve the following deficits and impairments:  Abnormal gait, Difficulty walking, Dizziness, Decreased activity tolerance, Pain, Decreased range of motion, Decreased balance, Decreased mobility, Decreased strength, Postural dysfunction, Improper body mechanics, Hypomobility  Visit Diagnosis: Unsteadiness on feet  Difficulty in walking, not elsewhere classified  Dizziness and giddiness  Muscle weakness (generalized)     Problem List Patient Active Problem List   Diagnosis Date Noted   C[place]ocytic [place]) ([place] 12/16/2020   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   OSA on CPAP 10/30/2017   Hypogonadism, male 10/30/2017   Iron deficiency anemia due to chronic blood loss 11/20/2016   Erosive gastritis with hemorrhage    Melena    Acute blood loss anemia 09/27/2016   Acute upper GI bleed 09/27/2016   Abnormal liver diagnostic imaging 06/22/2016   Liver cirrhosis secondary to NASH ([place] 05/03/2016   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

## 2021-08-03 ENCOUNTER — Ambulatory Visit (INDEPENDENT_AMBULATORY_CARE_PROVIDER_SITE_OTHER): Payer: Medicare Other | Admitting: Family Medicine

## 2021-08-03 ENCOUNTER — Other Ambulatory Visit: Payer: Self-pay

## 2021-08-03 ENCOUNTER — Telehealth: Payer: Self-pay

## 2021-08-03 VITALS — BP 142/90 | Ht 66.0 in | Wt 160.0 lb

## 2021-08-03 DIAGNOSIS — M25559 Pain in unspecified hip: Secondary | ICD-10-CM

## 2021-08-04 ENCOUNTER — Encounter: Payer: Self-pay | Admitting: Family Medicine

## 2021-08-04 ENCOUNTER — Ambulatory Visit: Payer: Medicare Other

## 2021-08-04 NOTE — Progress Notes (Signed)
PCP: Leanna Battles, MD  Subjective:   HPI: Patient is a 75 y.o. male here for left hip pain.  2/7: Left Hip pain: 75 yo man presenting for f/u of left hip pain s/p fall in September 2021. He was seen at [facility] immediately after the fall and had lumbar MRI that showed compression fracture of L2 and anterolisthesis of L4-L5. He had an epidural injection at that time and has not had further pain in his back, but is concerned today that his L2 compression fracture may be cause of left postero-lateral hip pain. He reports that he has been in PT at [facility] and has made improvement (requires less ice and tylenol for pain during the day than previously), but still has pain and significant "limping" when walking. He is not interested in CSI at this time, reports he previously had an injection at [facility] likely in November, but unclear if it was CSI or "numbing" medication by patient report.  Pain primarily lateral left hip though improved compared to last visit.  3/21: Patient reports his strength is improving with physical therapy but very slowly. His hip pain is much better. Is sleeping better. Not needing tylenol now. Concern about his balance and stooping when walking. Feels a little unsteady.  8/17: Overall James [name] is doing well with his hip. Some pain and tenderness laterally. Has been doing vestibular rehab, home exercises and noticed quite a difference in his balance. Will have reverse total replacement surgery of his shoulder by Dr. 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      BENZONATATE PO Take by mouth as needed for cough.     buPROPion (WELLBUTRIN XL) 300 MG 24 hr tablet Take 300 mg by mouth every morning.     CEPHALEXIN PO Take by mouth. Prior to dental procedure (Patient not taking: Reported on 05/26/2021)     [employer] B-12 500 MCG SUBL Place 2 tablets under the tongue daily with breakfast. (Patient not taking: Reported on 05/26/2021)  12   Cyanocobalamin ([employer] B-12 SL) Place 1,000 mg under the tongue daily.     dasatinib (SPRYCEL) 70 MG tablet TAKE 1 TABLET (70 MG TOTAL) BY MOUTH DAILY. 30 tablet 4   desmopressin (DDAVP) 0.2 MG tablet Take 0.4 mg by mouth 2 (two) times daily.      fluticasone (FLONASE) 50 MCG/ACT nasal spray Place 1 spray into both nostrils 2 (two) times daily.  glucose blood test strip OneTouch Ultra Blue Test Strip  USE TO TEST BLOOD SUGAR ONCE DAILY     Lidocaine 4 % PTCH Apply topically daily as needed.     LORazepam (ATIVAN) 0.5 MG tablet Take 0.5 mg by mouth daily.   3   metFORMIN (GLUCOPHAGE-XR) 500 MG 24 hr tablet Take 500 mg by mouth 2 (two) times daily.     moxifloxacin (VIGAMOX) 0.5 % ophthalmic solution PLACE 1 DROP IN RIGHT EYE FOUR TIMES A DAY USE THE DAY BEFORE, DAY OF, AND DAY AFTER TREATMENT.  5   pantoprazole (PROTONIX) 40 MG tablet Take 40 mg by mouth 2 (two) times daily.     polyethylene glycol (MIRALAX / GLYCOLAX) 17 g packet Take 17 g by mouth daily as needed.     PRESCRIPTION MEDICATION every 8 (eight) weeks. Gets injections in right eye at dr's office every  8 weeks      rosuvastatin (CRESTOR) 20 MG tablet Take 20 mg by mouth at bedtime.     testosterone cypionate (DEPOTESTOSTERONE CYPIONATE) 200 MG/ML injection Inject 150 mg into the muscle every 21 ( twenty-one) days.      TRULICITY 1.5 HQ/4.6NG SOPN SMARTSIG:0.5 Milliliter(s) SUB-Q Once a Week     VIAGRA 100 MG tablet Take 100 mg by mouth daily as needed for erectile dysfunction.   1   XARELTO 10 MG TABS tablet TAKE 1 TABLET BY MOUTH EVERY DAY 30 tablet 11   zolpidem (AMBIEN) 10 MG tablet Take 10 mg by mouth at bedtime as needed.     No current facility-administered medications on file prior to visit.    Past Surgical History:  Procedure Laterality Date   CARDIAC CATHETERIZATION     greater 10 yrs ago, normal   CARPAL TUNNEL RELEASE     bilateral   COLONOSCOPY  06/2005   [facility] Dr Lajoyce [place]   ESOPHAGOGASTRODUODENOSCOPY (EGD) WITH PROPOFOL N/A 09/28/2016   Procedure: ESOPHAGOGASTRODUODENOSCOPY (EGD) WITH PROPOFOL;  Surgeon: Gatha Mayer, MD;  Location: [facility];  Service: Endoscopy;  Laterality: N/A;   IR GENERIC HISTORICAL  12/12/2016   IR US GUIDE VASC ACCESS RIGHT 12/12/2016 [name], MD [facility]   IR GENERIC HISTORICAL  12/12/2016   IR VENOGRAM HEPATIC W HEMODYNAMIC EVALUATION 12/12/2016 [name], MD [facility]   IR GENERIC HISTORICAL  12/12/2016   IR TRANSCATHETER BX 12/12/2016 [name], MD [facility]   ROTATOR CUFF REPAIR     right   TENDON REPAIR     left arm   TONSILLECTOMY     TOTAL KNEE ARTHROPLASTY Right 2010   WISDOM TOOTH EXTRACTION      Allergies  Allergen Reactions   Ibuprofen Anaphylaxis and Other (See Comments)    Upper GI Bled   Nsaids Other (See Comments)    Social History   Socioeconomic History   Marital status: Married    Spouse name: Not on file   Number of children: 2   Years of education: Not on file   Highest education level: Not on file  Occupational History   Occupation: retired  Tobacco Use   Smoking status:  Never   Smokeless tobacco: Never   Tobacco comments:    never used tobacco  Vaping Use   Vaping Use: Never used  Substance and Sexual Activity   Alcohol use: Yes    Alcohol/week: 0.0 standard drinks   Drug use: No   Sexual activity: Not on file  Other Topics Concern   Not on  file  Social History Narrative   Right handed.  Caffeine 3 cups daily,  Married 2 kids (one deceased).  College grad.  [employer].    Social Determinants of Health   Financial Resource Strain: Not on file  Food Insecurity: Not on file  Transportation Needs: Not on file  Physical Activity: Not on file  Stress: Not on file  Social Connections: Not on file  Intimate Partner Violence: Not on file    Family History  Problem Relation Age of Onset   Bipolar disorder Son        committed suicide   Colon cancer Other    Heart failure Other    Heart attack Other    Lung disease Father        history   Diabetes Mother        history   Stroke Mother        history   Colon polyps Neg Hx    Rectal cancer Neg Hx    Stomach cancer Neg Hx    Esophageal cancer Neg Hx     BP (!) 142/90   Ht 5' 6"  (1.676 m)   Wt 160 lb (72.6 kg)   BMI 25.82 kg/m   No flowsheet data found.  No flowsheet data found.  Review of Systems: See HPI above.     Objective:  Physical Exam:  Gen: NAD, comfortable in exam room  Left hip: No deformity. FROM with 5/5 strength except 4/5 hip abduction. Mild tenderness over greater trochanter. NVI distally. Negative faber, fadir, logroll.   Assessment & Plan:  1. Left hip pain - encouraged focus on hip abduction, a little decrease in strength noted today compared to last visit for this issue.  Overall doing well though.  Consider injection in future.  Tylenol, icing if needed.  Continue vestibular rehab.

## 2021-08-08 ENCOUNTER — Ambulatory Visit: Payer: Medicare Other

## 2021-08-08 ENCOUNTER — Other Ambulatory Visit: Payer: Self-pay

## 2021-08-08 DIAGNOSIS — M6281 Muscle weakness (generalized): Secondary | ICD-10-CM

## 2021-08-08 DIAGNOSIS — R262 Difficulty in walking, not elsewhere classified: Secondary | ICD-10-CM

## 2021-08-08 DIAGNOSIS — R2681 Unsteadiness on feet: Secondary | ICD-10-CM | POA: Diagnosis not present

## 2021-08-08 DIAGNOSIS — R42 Dizziness and giddiness: Secondary | ICD-10-CM

## 2021-08-08 NOTE — Therapy (Signed)
[facility] [address] [place], [place], [address] Phone: [phone]   Fax:  [phone]  Physical Therapy Treatment/Progress Note  Patient Details  Name: James [name]. MRN: [id number] Date of Birth: 1946/05/30 Referring Provider (PT): Leanna Battles, MD  Physical Therapy Progress Note   Dates of Reporting Period: 07/01/21 - 08/08/21  See Note below for Objective Data and Assessment of Progress/Goals.  Thank you for the referral of this patient. 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)    History   Portal vein thrombosis    Rhinitis    Situational depression    Sleep apnea    uses CPAP every night   Tubular adenoma 12/10/2015   6 cecum polyps    Past Surgical History:  Procedure Laterality Date   CARDIAC CATHETERIZATION     greater 10 yrs ago, normal   CARPAL TUNNEL RELEASE      bilateral   COLONOSCOPY  06/2005   [facility] Dr Lajoyce [place]   ESOPHAGOGASTRODUODENOSCOPY (EGD) WITH PROPOFOL N/A 09/28/2016   Procedure: ESOPHAGOGASTRODUODENOSCOPY (EGD) WITH PROPOFOL;  Surgeon: Gatha Mayer, MD;  [facility];  Service: Endoscopy;  Laterality: N/A;   IR GENERIC HISTORICAL  12/12/2016   IR US GUIDE VASC ACCESS RIGHT 12/12/2016 [name], MD [facility]   IR GENERIC HISTORICAL  12/12/2016   IR VENOGRAM HEPATIC W HEMODYNAMIC EVALUATION 12/12/2016 [name], MD [facility]   IR GENERIC HISTORICAL  12/12/2016   IR TRANSCATHETER BX 12/12/2016 [name], MD [facility]   ROTATOR CUFF REPAIR     right   TENDON REPAIR     left arm   TONSILLECTOMY     TOTAL KNEE ARTHROPLASTY Right 2010   WISDOM TOOTH EXTRACTION      There were no vitals filed for this visit.   Subjective Assessment - 08/08/21 0850     Subjective Patient reports that knee still feel slightly better. No falls or new changes.    Pertinent History Anxiety, arthritis, CML, depression, diabetes, GERD, HTN, hyperlipidemia, insomina, macular degeneration, prior R TKA    Limitations Standing;Walking;House hold activities    How long can you  sit comfortably? n/a    How long can you stand comfortably? has chronic back/hip pain    How long can you walk comfortably? House to car only due to not feeling comfortable; will use RW in grocery store    Diagnostic tests Awaiting MRI results for R shoulder    Patient Stated Goals Improve balance and decrease falls/unsteadiness/dizziness; wife would like pt to be able to walk/stand straighter    Currently in Pain? No/denies    Pain Onset More than a month ago    Pain Onset 1 to 4 weeks ago               [facility] Adult PT Treatment/Exercise - 08/08/21 0001       Ambulation/Gait   Ambulation/Gait Yes    Ambulation/Gait Assistance 5: Supervision    Ambulation/Gait Assistance Details throughout therapy session wtih activities    Assistive device None     Gait Pattern Step-through pattern;Lateral trunk lean to right;Lateral hip instability;Abducted - left;Abducted- right    Ambulation Surface Level;Indoor      Neuro Re-ed    Neuro Re-ed Details  completed habituation to bending from standing position 2 x 5  reps, then progressed to standing on blue mat and completed 2 x 5 reps, with increased balance challenge noted on complaint surface requiring CGA. Added in 5 sec hold to bending with second set to further challenge patient. standing on firm surface completed figure 8 with weighted ball 3 x 3 reps with no dizziness reported.             Vestibular Treatment/Exercise - 08/08/21 0001       Vestibular Treatment/Exercise   Vestibular Treatment Provided Gaze      Eye/Head Exercise Horizontal   Foot Position Sitting on dynadisk    Comments 3 targets body rotations & bending to target, 4 x 5 reps              Balance Exercises - 08/08/21 0001       Balance Exercises: Standing   Balance Beam standing across blue balance beam without UE support completed horizontal/vertical head turns x 10 reps. then progressed to eyes closed, 3 x 30 seconds, min A and intermittent UE support required due to challenge.    Sit to Stand Standard surface;Foam/compliant surface;Limitations    Sit to Stand Limitations completed sit <> stands with BLE on airex x 10 reps with intermittent UE support utilized. cues for control with completion.    Other Standing Exercises standing staggered stance on airex, completed trunk rotations x 10 reps, then completed second set with alternating foot position. intermittent CGA required. Rest break required after completion.                 PT Short Term Goals - 07/25/21 1656       PT SHORT TERM GOAL #1   Title Pt will be independent with initial HEP    Baseline Lost paper but states he's been trying the balance exercises 07/25/21    Time 4    Period Weeks    Status Partially Met    Target Date 07/29/21       PT SHORT TERM GOAL #2   Title Pt will report at least 25% improvement in his dizziness    Baseline Pt states not as much as 25% improvement but notes last dizzy episode last Friday 07/25/21    Time 4    Period Weeks    Status Not Met  Target Date 07/29/21      PT SHORT TERM GOAL #3   Title PT will assess and provide LTGs for dynamic gait    Time 2    Period Weeks    Status Achieved    Target Date 07/29/21      PT SHORT TERM GOAL #4   Title Pt will be able to maintain balance for at least 30 sec in all conditions of mCTSIB to demo improving sensory integration    Baseline M-CTSIB: situation 2 for 20.35 seconds, situation 4: 11 seconds; situation 2 for 30 sec, situation 4 for 18 sec 07/25/21    Time 4    Period Weeks    Status Partially Met    Target Date 07/15/21      PT SHORT TERM GOAL #5   Title Pt will have improved TUG time to </=12 sec to demo decreased fall risk    Baseline 14; 12 sec on 07/25/21 but minor LOB during turn    Time 4    Period Weeks    Status Achieved    Target Date 07/29/21               PT Long Term Goals - 07/06/21 0853       PT LONG TERM GOAL #1   Title Pt will be independent with final HEP (all LTGs due 08/26/21)    Time 8    Period Weeks    Status On-going      PT LONG TERM GOAL #2   Title Pt will report >50% improvement with his dizziness    Time 8    Period Weeks    Status On-going      PT LONG TERM GOAL #3   Title Pt will be able to amb >1000' indoors and outdoors without a/d for improved community mobility    Baseline Currently self limiting himself from house to the car and to appointments    Time 8    Period Weeks    Status On-going      PT LONG TERM GOAL #4   Title Pt will have improved FOTO score to at least 57    Baseline 43    Time 8    Period Weeks    Status On-going      PT LONG TERM GOAL #5   Title Pt will have improved FGA score to at least 22/30 to demonstrate decreased risk of falls    Baseline 15/30     Time 8    Period Weeks    Status New    Target Date 08/26/21      Additional Long Term Goals   Additional Long Term Goals Yes                   Plan - 08/08/21 1030     Clinical Impression Statement Patient continues to demo slow steady progress with PT services. Continued balance exercises and habituation to bending with no significant imbalance or dizziness noted. Will continue to benefit from skilled PT services to progress toward all LTGs.    Personal Factors and Comorbidities Age;Fitness;Comorbidity 1;Comorbidity 2;Comorbidity 3+    Comorbidities R TKA, Anxiety, arthritis, CML, depression, diabetes, GERD, HTN, hyperlipidemia, insomina, decreased L vision    Examination-Activity Limitations Bend;Stand;Stairs;Squat;Locomotion Level;Transfers    Examination-Participation Restrictions Community Activity;Yard Work;Cleaning    Stability/Clinical Decision Making Evolving/Moderate complexity    Rehab Potential Good    PT Frequency 2x / week    PT Duration 8  weeks    PT Treatment/Interventions ADLs/Self Care Home Management;Aquatic Therapy;Canalith Repostioning;Electrical Stimulation;Cryotherapy;Iontophoresis 68m/ml Dexamethasone;Moist Heat;DME Instruction;Gait training;Stair training;Functional mobility training;Therapeutic activities;Therapeutic exercise;Balance training;Neuromuscular re-education;Manual techniques;Patient/family education;Passive range of motion;Dry needling;Taping;Vestibular;Spinal Manipulations    PT Next Visit Plan Patient has yet to bring in his walking sticks. Please adjust for height, and begin gait training with these. Progress habituation exercises and balance exercises (specifically with eyes closed and on compliant surfaces). Continue neck stretches/AROM to increase movement without additional body movements. Continue strengthening as needed for core and hip stability.    PT Home Exercise Plan Standing on pillow eyes closed; forward bend with body rotation  looking at 2 targets 2x10; forward bend with diagonal body movements looking at 2 targets 2x10    Consulted and Agree with Plan of Care Patient             Patient will benefit from skilled therapeutic intervention in order to improve the following deficits and impairments:  Abnormal gait, Difficulty walking, Dizziness, Decreased activity tolerance, Pain, Decreased range of motion, Decreased balance, Decreased mobility, Decreased strength, Postural dysfunction, Improper body mechanics, Hypomobility  Visit Diagnosis: Unsteadiness on feet  Difficulty in walking, not elsewhere classified  Dizziness and giddiness  Muscle weakness (generalized)     Problem List Patient Active Problem List   Diagnosis Date Noted   CML (chronic myelocytic leukemia) ([place] 12/16/2020   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   OSA on CPAP 10/30/2017   Hypogonadism, male 10/30/2017   Iron deficiency anemia due to chronic blood loss 11/20/2016   Erosive gastritis with hemorrhage    Melena    Acute blood loss anemia 09/27/2016   Acute upper GI bleed 09/27/2016   Abnormal liver diagnostic imaging 06/22/2016   Liver cirrhosis secondary to NASH ([place] 05/03/2016   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

## 2021-08-15 ENCOUNTER — Other Ambulatory Visit: Payer: Self-pay

## 2021-08-15 ENCOUNTER — Ambulatory Visit: Payer: Medicare Other

## 2021-08-15 DIAGNOSIS — R2681 Unsteadiness on feet: Secondary | ICD-10-CM

## 2021-08-15 DIAGNOSIS — R262 Difficulty in walking, not elsewhere classified: Secondary | ICD-10-CM

## 2021-08-15 DIAGNOSIS — M6281 Muscle weakness (generalized): Secondary | ICD-10-CM

## 2021-08-15 NOTE — Patient Instructions (Signed)
Balance: Eyes Open - Bilateral (Firm Surfaces)    Stand, feet together, eyes open. Maintain balance 30 seconds. Repeat 3 times per set. Do 1 sets per session. Do 5 sessions per week.     Feet Together, Head Motion - Eyes Open    With eyes open, feet together, move head slowly: up and down x 10 times. Followed by right and left x 10 times.  Repeat 2 times per session. Do 1 sessions per day.  Side Bend, Sitting    Sit, hand over top of head. Gently pull head to one side. Hold 30 seconds. Repeat 3 times per session. Do 1 sessions per day.   Gaze Stabilization: Standing Feet Together    Feet together, keeping eyes on target on wall 3-4 feet away, tilt head down 15-30 and move head side to side for 30 seconds. Repeat while moving head up and down for 30 seconds. Do 2-3 sessions per day. Copyright  VHI. All rights reserved.

## 2021-08-15 NOTE — Therapy (Signed)
[facility] [address] [place], [place], [address] Phone: [phone]   Fax:  [phone]  Physical Therapy Treatment  Patient Details  Name: James [name]. MRN: [id number] Date of Birth: May 26, 1946 Referring Provider (PT): Leanna Battles, MD   Encounter Date: 08/15/2021   PT End of Session - 08/15/21 0851     Visit Number 11    Number of Visits 17    Date for PT Re-Evaluation 08/26/21    Authorization Type Medicare    Progress Note Due on Visit 10    PT Start Time 0847    PT Stop Time 0929    PT Time Calculation (min) 42 min    Equipment Utilized During Treatment Gait belt    Activity Tolerance Patient tolerated treatment well    Behavior During Therapy [facility] for tasks assessed/performed             Past Medical History:  Diagnosis Date   Anxiety    Arthritis    Clotting disorder ([place])    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)    History   Portal vein thrombosis    Rhinitis    Situational depression    Sleep apnea    uses CPAP every night   Tubular adenoma 12/10/2015   6 cecum polyps    Past Surgical History:  Procedure Laterality Date   CARDIAC CATHETERIZATION     greater 10 yrs ago, normal   CARPAL TUNNEL RELEASE     bilateral   COLONOSCOPY  06/2005   [facility]   ESOPHAGOGASTRODUODENOSCOPY (EGD) WITH PROPOFOL N/A 09/28/2016   Procedure: ESOPHAGOGASTRODUODENOSCOPY (EGD) WITH PROPOFOL;  Surgeon: Gatha Mayer, MD;  Location: [facility];   Service: Endoscopy;  Laterality: N/A;   IR GENERIC HISTORICAL  12/12/2016   IR US GUIDE VASC ACCESS RIGHT 12/12/2016 [name], MD [facility]   IR GENERIC HISTORICAL  12/12/2016   IR VENOGRAM HEPATIC W HEMODYNAMIC EVALUATION 12/12/2016 [name], MD [facility]   IR GENERIC HISTORICAL  12/12/2016   IR TRANSCATHETER BX 12/12/2016 [name], MD [facility]   ROTATOR CUFF REPAIR     right   TENDON REPAIR     left arm   TONSILLECTOMY     TOTAL KNEE ARTHROPLASTY Right 2010   WISDOM TOOTH EXTRACTION      There were no vitals filed for this visit.   Subjective Assessment - 08/15/21 0849     Subjective Reports that is still feeling anxious about the upcoming surgery. Did not go on his trip. No new falls. No pain.    Pertinent History Anxiety, arthritis, CML, depression, diabetes, GERD, HTN, hyperlipidemia, insomina, macular degeneration, prior R TKA    Limitations Standing;Walking;House hold activities    How long can you sit comfortably? n/a    How long can you stand comfortably? has chronic back/hip pain    How long can you walk comfortably? House to car only due  to not feeling comfortable; will use RW in grocery store    Diagnostic tests Awaiting MRI results for R shoulder    Patient Stated Goals Improve balance and decrease falls/unsteadiness/dizziness; wife would like pt to be able to walk/stand straighter    Currently in Pain? No/denies    Pain Onset 1 to 4 weeks ago               [facility] Adult PT Treatment/Exercise - 08/15/21 0001       Ambulation/Gait   Ambulation/Gait Yes    Ambulation/Gait Assistance 5: Supervision    Ambulation/Gait Assistance Details Completed gait outdoors on unlevel surfaces x 750 ft with bilateral trekking poles on paved surface. Trialed 1 pole vs. 2 poles iwth improved stability and comfort with 2 poles. CGA required intermittently. Increased fatigue noted at end requiring rest break. Pt provided extensive education of sequencing with  intermittent uces required throughout use of device and at end of session. patient continue to ambulate with devices at end of session.    Ambulation Distance (Feet) 750 Feet    Assistive device None   Bilateral walking Sticks   Gait Pattern Step-through pattern;Lateral trunk lean to right;Lateral hip instability;Abducted - left;Abducted- right    Ambulation Surface Level;Indoor;Unlevel;Outdoor    Gait Comments Adjust patient's personal walking sticks to appropriate height prior to ambulating outdoors. PT educating on proper use of this AD. PT educating on walking program and provided handout to patient. Patient verbalize understanding.      High Level Balance   High Level Balance Activities Tandem walking;Marching forwards;Backward walking    High Level Balance Comments on blue mat at countertop: completed marching forwards followed by backwards x 4 laps down and back. Cues for improved hip/knee flexion. Then completed tandem walking and sidestepping x 3 laps down and back.      Exercises   Other Exercises  Provided copy of exercises to patient, per patient request              PT Education - 08/15/21 0929     Education Details HEP Copy; Education on Use of Walking Sticks; Walking Program    Person(s) Educated Patient    Methods Explanation    Comprehension Verbalized understanding              PT Short Term Goals - 07/25/21 1656       PT SHORT TERM GOAL #1   Title Pt will be independent with initial HEP    Baseline Lost paper but states he's been trying the balance exercises 07/25/21    Time 4    Period Weeks    Status Partially Met    Target Date 07/29/21      PT SHORT TERM GOAL #2   Title Pt will report at least 25% improvement in his dizziness    Baseline Pt states not as much as 25% improvement but notes last dizzy episode last Friday 07/25/21    Time 4    Period Weeks    Status Not Met    Target Date 07/29/21      PT SHORT TERM GOAL #3   Title PT will assess  and provide LTGs for dynamic gait    Time 2    Period Weeks    Status Achieved    Target Date 07/29/21      PT SHORT TERM GOAL #4   Title Pt will be able to maintain balance for at least 30 sec in all conditions of mCTSIB  to demo improving sensory integration    Baseline M-CTSIB: situation 2 for 20.35 seconds, situation 4: 11 seconds; situation 2 for 30 sec, situation 4 for 18 sec 07/25/21    Time 4    Period Weeks    Status Partially Met    Target Date 07/15/21      PT SHORT TERM GOAL #5   Title Pt will have improved TUG time to </=12 sec to demo decreased fall risk    Baseline 14; 12 sec on 07/25/21 but minor LOB during turn    Time 4    Period Weeks    Status Achieved    Target Date 07/29/21               PT Long Term Goals - 07/06/21 0853       PT LONG TERM GOAL #1   Title Pt will be independent with final HEP (all LTGs due 08/26/21)    Time 8    Period Weeks    Status On-going      PT LONG TERM GOAL #2   Title Pt will report >50% improvement with his dizziness    Time 8    Period Weeks    Status On-going      PT LONG TERM GOAL #3   Title Pt will be able to amb >1000' indoors and outdoors without a/d for improved community mobility    Baseline Currently self limiting himself from house to the car and to appointments    Time 8    Period Weeks    Status On-going      PT LONG TERM GOAL #4   Title Pt will have improved FOTO score to at least 57    Baseline 43    Time 8    Period Weeks    Status On-going      PT LONG TERM GOAL #5   Title Pt will have improved FGA score to at least 22/30 to demonstrate decreased risk of falls    Baseline 15/30    Time 8    Period Weeks    Status New    Target Date 08/26/21      Additional Long Term Goals   Additional Long Term Goals Yes                   Plan - 08/15/21 0856     Clinical Impression Statement Completed gait training outdoors with walking sticks/trekking poles on unlevel surfaces. PT adjusting  patient's trekking poles to appopriate height. Patient demo improved stability on outdoor surfaces with use of AD. Continued high level balance and educating on walking program for improved carryover outside of session.    Personal Factors and Comorbidities Age;Fitness;Comorbidity 1;Comorbidity 2;Comorbidity 3+    Comorbidities R TKA, Anxiety, arthritis, CML, depression, diabetes, GERD, HTN, hyperlipidemia, insomina, decreased L vision    Examination-Activity Limitations Bend;Stand;Stairs;Squat;Locomotion Level;Transfers    Examination-Participation Restrictions Community Activity;Yard Work;Cleaning    Stability/Clinical Decision Making Evolving/Moderate complexity    Rehab Potential Good    PT Frequency 2x / week    PT Duration 8 weeks    PT Treatment/Interventions ADLs/Self Care Home Management;Aquatic Therapy;Canalith Repostioning;Electrical Stimulation;Cryotherapy;Iontophoresis 34m/ml Dexamethasone;Moist Heat;DME Instruction;Gait training;Stair training;Functional mobility training;Therapeutic activities;Therapeutic exercise;Balance training;Neuromuscular re-education;Manual techniques;Patient/family education;Passive range of motion;Dry needling;Taping;Vestibular;Spinal Manipulations    PT Next Visit Plan Trial walking sticks again outdoors if needed.  Progress habituation exercises and balance exercises (specifically with eyes closed and on compliant surfaces). Continue neck stretches/AROM to increase movement without  additional body movements. Continue strengthening as needed for core and hip stability.    PT Home Exercise Plan Standing on pillow eyes closed; forward bend with body rotation looking at 2 targets 2x10; forward bend with diagonal body movements looking at 2 targets 2x10    Consulted and Agree with Plan of Care Patient             Patient will benefit from skilled therapeutic intervention in order to improve the following deficits and impairments:  Abnormal gait, Difficulty  walking, Dizziness, Decreased activity tolerance, Pain, Decreased range of motion, Decreased balance, Decreased mobility, Decreased strength, Postural dysfunction, Improper body mechanics, Hypomobility  Visit Diagnosis: Unsteadiness on feet  Difficulty in walking, not elsewhere classified  Muscle weakness (generalized)     Problem List Patient Active Problem List   Diagnosis Date Noted   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   OSA on CPAP 10/30/2017   Hypogonadism, male 10/30/2017   Iron deficiency anemia due to chronic blood loss 11/20/2016   Erosive gastritis with hemorrhage    Melena    Acute blood loss anemia 09/27/2016   Acute upper GI bleed 09/27/2016   Abnormal liver diagnostic imaging 06/22/2016   Liver cirrhosis secondary to NASH ([place]) 05/03/2016   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   Portal vein thrombosis 04/29/2016   Diabetes mellitus ([place]) 04/29/2016   Leukocytosis 04/29/2016   Dyspnea 12/31/2015   Essential hypertension 12/31/2015   Hyperlipidemia 12/31/2015   Nocturia more than twice per night 11/11/2014   Severe obesity (BMI >= 40) ([place]) 11/11/2014   Hypersomnia with sleep apnea 11/11/2014   Polycythemia vera ([place]) 11/29/2011    Jones Bales, PT, DPT 08/15/2021, 10:15 AM  [place] [facility] [address] [place], [place], [address] Phone: [phone]   Fax:  [phone]  Name: James [name]. MRN: [id number] Date of Birth: 23-Sep-1946

## 2021-08-18 ENCOUNTER — Ambulatory Visit: Payer: Medicare Other | Attending: Internal Medicine | Admitting: Physical Therapy

## 2021-08-18 ENCOUNTER — Encounter: Payer: Self-pay | Admitting: Physical Therapy

## 2021-08-18 ENCOUNTER — Other Ambulatory Visit: Payer: Self-pay

## 2021-08-18 DIAGNOSIS — R42 Dizziness and giddiness: Secondary | ICD-10-CM | POA: Insufficient documentation

## 2021-08-18 DIAGNOSIS — R262 Difficulty in walking, not elsewhere classified: Secondary | ICD-10-CM | POA: Diagnosis present

## 2021-08-18 DIAGNOSIS — R2681 Unsteadiness on feet: Secondary | ICD-10-CM | POA: Diagnosis present

## 2021-08-18 DIAGNOSIS — M6281 Muscle weakness (generalized): Secondary | ICD-10-CM | POA: Insufficient documentation

## 2021-08-18 NOTE — Patient Instructions (Signed)
DUE TO COVID-19 ONLY ONE VISITOR IS ALLOWED TO COME WITH YOU AND STAY IN THE WAITING ROOM ONLY DURING PRE OP AND PROCEDURE DAY OF SURGERY IF YOU ARE GOING HOME AFTER SURGERY. IF YOU ARE SPENDING THE NIGHT 2 PEOPLE MAY VISIT WITH YOU IN YOUR PRIVATE ROOM AFTER SURGERY UNTIL VISITING  HOURS ARE OVER AT 800 PM AND THE 2 VISITORS CANNOT SPEND THE NIGHT.                 James [name].     Your procedure is scheduled on: 09/01/21   Report to [facility] Main  Entrance   Report to admitting at   7:15 AM     Call this number if you have problems the morning of surgery [phone]      No food after midnight.    You may have clear liquid until 7:00  AM.    At 6:30 AM drink pre surgery drink.   Nothing by mouth after 7:00 AM.   CLEAR LIQUID DIET   Foods Allowed                                                                     Foods Excluded  Coffee and tea, regular and decaf                             NO MILK OR CREAMER Plain Jell-O any favor except red or purple                                           see through such as Fruit ices (not with fruit pulp)                                     milk, soups, orange juice  Iced Popsicles                                    All solid food Carbonated beverages, regular and diet                                    Cranberry, grape and apple juices Sports drinks like Gatorade Lightly seasoned clear broth or consume(fat free) Sugar     BRUSH YOUR TEETH MORNING OF SURGERY AND RINSE YOUR MOUTH OUT, NO CHEWING GUM CANDY OR MINTS.     Take these medicines the morning of surgery with A SIP OF WATER: Bupropion, Dasatinib  Stop taking _Xarelto__________on __9/12________as instructed by __Dr. Hanover___________.                How to Manage Your Diabetes Before and After Surgery  Why is it important to control my blood sugar before and after surgery? Improving blood sugar levels before and after surgery helps healing and  can limit problems. A way of improving blood sugar control is eating a  healthy diet by:  Eating less sugar and carbohydrates  Increasing activity/exercise  Talking with your doctor about reaching your blood sugar goals High blood sugars (greater than 180 mg/dL) can raise your risk of infections and slow your recovery, so you will need to focus on controlling your diabetes during the weeks before surgery. Make sure that the doctor who takes care of your diabetes knows about your planned surgery including the date and location.  How do I manage my blood sugar before surgery? Check your blood sugar at least 4 times a day, starting 2 days before surgery, to make sure that the level is not too high or low. Check your blood sugar the morning of your surgery when you wake up and every 2 hours until you get to the Short Stay unit. If your blood sugar is less than 70 mg/dL, you will need to treat for low blood sugar: Do not take insulin. Treat a low blood sugar (less than 70 mg/dL) with  cup of clear juice (cranberry or apple), 4 glucose tablets, OR glucose gel. Recheck blood sugar in 15 minutes after treatment (to make sure it is greater than 70 mg/dL). If your blood sugar is not greater than 70 mg/dL on recheck, call [phone] for further instructions. Report your blood sugar to the short stay nurse when you get to Short Stay.  If you are admitted to the hospital after surgery: Your blood sugar will be checked by the staff and you will probably be given insulin after surgery (instead of oral diabetes medicines) to make sure you have good blood sugar levels. The goal for blood sugar control after surgery is 80-180 mg/dL.   WHAT DO I DO ABOUT MY DIABETES MEDICATION?  Do not take oral diabetes medicines (pills) the morning of surgery.    The day of surgery, do not take other diabetes injectables, including Byetta (exenatide), Bydureon (exenatide ER), Victoza (liraglutide), or Trulicity  (dulaglutide).                    You may not have any metal on your body including              piercings  Do not wear jewelry, lotions, powders or deodorant                         Men may shave face and neck.   Do not bring valuables to the hospital. James [name].  Contacts, dentures or bridgework may not be worn into surgery.       Patients discharged the day of surgery will not be allowed to drive home.   IF YOU ARE HAVING SURGERY AND GOING HOME THE SAME DAY, YOU MUST HAVE AN ADULT TO DRIVE YOU HOME AND BE WITH YOU FOR 24 HOURS.  YOU MAY GO HOME BY TAXI OR UBER OR ORTHERWISE, BUT AN ADULT MUST ACCOMPANY YOU HOME AND STAY WITH YOU FOR 24 HOURS.  Name and phone number of your driver:  Special Instructions: N/A              Please read over the following fact sheets you were given: _____________________________________________________________________  [facility]- Preparing for Total Shoulder Arthroplasty    Before surgery, you can play an important role. Because skin is not sterile, your skin needs to be as free of germs as possible.  You can reduce the number of germs on your skin by using the following products. Benzoyl Peroxide Gel Reduces the number of germs present on the skin Applied twice a day to shoulder area starting two days before surgery    ==================================================================  Please follow these instructions carefully:  BENZOYL PEROXIDE 5% GEL  Please do not use if you have an allergy to benzoyl peroxide.   If your skin becomes reddened/irritated stop using the benzoyl peroxide.  Starting two days before surgery, apply as follows: Apply benzoyl peroxide in the morning and at night. Apply after taking a shower. If you are not taking a shower clean entire shoulder front, back, and side along with the armpit with a clean wet washcloth.  Place a quarter-sized dollop on your shoulder and  rub in thoroughly, making sure to cover the front, back, and side of your shoulder, along with the armpit.   2 days before ____ AM   ____ PM              1 day before ____ AM   ____ PM                         Do this twice a day for two days.  (Last application is the night before surgery, AFTER using the CHG soap as described below).  Do NOT apply benzoyl peroxide gel on the day of surgery.             [facility] - Preparing for Surgery Before surgery, you can play an important role.  Because skin is not sterile, your skin needs to be as free of germs as possible.  You can reduce the number of germs on your skin by washing with CHG (chlorahexidine gluconate) soap before surgery.  CHG is an antiseptic cleaner which kills germs and bonds with the skin to continue killing germs even after washing. Please DO NOT use if you have an allergy to CHG or antibacterial soaps.  If your skin becomes reddened/irritated stop using the CHG and inform your nurse when you arrive at Short Stay. You may shave your face/neck. Please follow these instructions carefully:  1.  Shower with CHG Soap the night before surgery and the  morning of Surgery.  2.  If you choose to wash your hair, wash your hair first as usual with your  normal  shampoo.  3.  After you shampoo, rinse your hair and body thoroughly to remove the  shampoo.                            4.  Use CHG as you would any other liquid soap.  You can apply chg directly  to the skin and wash                       Gently with a scrungie or clean washcloth.  5.  Apply the CHG Soap to your body ONLY FROM THE NECK DOWN.   Do not use on face/ open                           Wound or open sores. Avoid contact with eyes, ears mouth and genitals (private parts).                       Wash face,  Development worker, international aid (private  parts) with your normal soap.             6.  Wash thoroughly, paying special attention to the area where your surgery  will be performed.  7.  Thoroughly  rinse your body with warm water from the neck down.  8.  DO NOT shower/wash with your normal soap after using and rinsing off  the CHG Soap.                9.  Pat yourself dry with a clean towel.            10.  Wear clean pajamas.            11.  Place clean sheets on your bed the night of your first shower and do not  sleep with pets. Day of Surgery : Do not apply any lotions/deodorants the morning of surgery.  Please wear clean clothes to the hospital/surgery center.  FAILURE TO FOLLOW THESE INSTRUCTIONS MAY RESULT IN THE CANCELLATION OF YOUR SURGERY PATIENT SIGNATURE_________________________________  NURSE SIGNATURE__________________________________  ________________________________________________________________________   Adam Phenix  An incentive spirometer is a tool that can help keep your lungs clear and active. This tool measures how well you are filling your lungs with each breath. Taking long deep breaths may help reverse or decrease the chance of developing breathing (pulmonary) problems (especially infection) following: A long period of time when you are unable to move or be active. BEFORE THE PROCEDURE  If the spirometer includes an indicator to show your best effort, your nurse or respiratory therapist will set it to a desired goal. If possible, sit up straight or lean slightly forward. Try not to slouch. Hold the incentive spirometer in an upright position. INSTRUCTIONS FOR USE  Sit on the edge of your bed if possible, or sit up as far as you can in bed or on a chair. Hold the incentive spirometer in an upright position. Breathe out normally. Place the mouthpiece in your mouth and seal your lips tightly around it. Breathe in slowly and as deeply as possible, raising the piston or the ball toward the top of the column. Hold your breath for 3-5 seconds or for as long as possible. Allow the piston or ball to fall to the bottom of the column. Remove the mouthpiece  from your mouth and breathe out normally. Rest for a few seconds and repeat Steps 1 through 7 at least 10 times every 1-2 hours when you are awake. Take your time and take a few normal breaths between deep breaths. The spirometer may include an indicator to show your best effort. Use the indicator as a goal to work toward during each repetition. After each set of 10 deep breaths, practice coughing to be sure your lungs are clear. If you have an incision (the cut made at the time of surgery), support your incision when coughing by placing a pillow or rolled up towels firmly against it. Once you are able to get out of bed, walk around indoors and cough well. You may stop using the incentive spirometer when instructed by your caregiver.  RISKS AND COMPLICATIONS Take your time so you do not get dizzy or light-headed. If you are in pain, you may need to take or ask for pain medication before doing incentive spirometry. It is harder to take a deep breath if you are having pain. AFTER USE Rest and breathe slowly and easily. It can be helpful to keep track of a log of your  progress. Your caregiver can provide you with a simple table to help with this. If you are using the spirometer at home, follow these instructions: [place] IF:  You are having difficultly using the spirometer. You have trouble using the spirometer as often as instructed. Your pain medication is not giving enough relief while using the spirometer. You develop fever of 100.5 F (38.1 C) or higher. SEEK IMMEDIATE MEDICAL CARE IF:  You cough up bloody sputum that had not been present before. You develop fever of 102 F (38.9 C) or greater. You develop worsening pain at or near the incision site. MAKE SURE YOU:  Understand these instructions. Will watch your condition. Will get help right away if you are not doing well or get worse. Document Released: 04/16/2007 Document Revised: 02/26/2012 Document Reviewed:  06/17/2007 [facility] Patient Information 2014 [place], [place].   ________________________________________________________________________

## 2021-08-19 NOTE — Therapy (Signed)
[facility] [address] [place], [place], [address] Phone: [phone]   Fax:  [phone]  Physical Therapy Treatment  Patient Details  Name: James [name]. MRN: [id number] Date of Birth: 10-31-1946 Referring Provider (PT): Leanna Battles, MD   Encounter Date: 08/18/2021   PT End of Session - 08/18/21 1240     Visit Number 12    Number of Visits 17    Date for PT Re-Evaluation 08/26/21    Authorization Type Medicare    Progress Note Due on Visit 42    PT Start Time 1235    PT Stop Time 1315    PT Time Calculation (min) 40 min    Equipment Utilized During Treatment Gait belt    Activity Tolerance Patient tolerated treatment well    Behavior During Therapy WFL for tasks assessed/performed             Past Medical History:  Diagnosis Date   Anxiety    Arthritis    Clotting disorder ([place])    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)    History   Portal vein thrombosis    Rhinitis    Situational depression    Sleep apnea    uses CPAP every night   Tubular adenoma 12/10/2015   6 cecum polyps    Past Surgical History:  Procedure Laterality Date   CARDIAC CATHETERIZATION     greater 10 yrs ago, normal   CARPAL TUNNEL RELEASE     bilateral   COLONOSCOPY  06/2005   [facility] [place]   ESOPHAGOGASTRODUODENOSCOPY (EGD) WITH PROPOFOL N/A 09/28/2016   Procedure: ESOPHAGOGASTRODUODENOSCOPY (EGD) WITH PROPOFOL;  Surgeon: Gatha Mayer, MD;  Location: [facility];   Service: Endoscopy;  Laterality: N/A;   IR GENERIC HISTORICAL  12/12/2016   IR US GUIDE VASC ACCESS RIGHT 12/12/2016 [name], MD [facility]   IR GENERIC HISTORICAL  12/12/2016   IR VENOGRAM HEPATIC W HEMODYNAMIC EVALUATION 12/12/2016 [name], MD [facility]   IR GENERIC HISTORICAL  12/12/2016   IR TRANSCATHETER BX 12/12/2016 [name], MD [facility]   ROTATOR CUFF REPAIR     right   TENDON REPAIR     left arm   TONSILLECTOMY     TOTAL KNEE ARTHROPLASTY Right 2010   WISDOM TOOTH EXTRACTION      There were no vitals filed for this visit.   Subjective Assessment - 08/18/21 1238     Subjective No new complaints. No falls or pain to report.    Pertinent History Anxiety, arthritis, CML, depression, diabetes, GERD, HTN, hyperlipidemia, insomina, macular degeneration, prior R TKA    Limitations Standing;Walking;House hold activities    How long can you sit comfortably? n/a    How long can you stand comfortably? has chronic back/hip pain    How long can you walk comfortably? House to car only due to not feeling comfortable; will use RW in grocery store  Diagnostic tests Awaiting MRI results for R shoulder    Patient Stated Goals Improve balance and decrease falls/unsteadiness/dizziness; wife would like pt to be able to walk/stand straighter    Currently in Pain? No/denies                     [facility] Adult PT Treatment/Exercise - 08/18/21 1252       Transfers   Transfers Sit to Stand;Stand to Sit    Sit to Stand 5: Supervision    Stand to Sit 5: Supervision      Ambulation/Gait   Ambulation/Gait Yes    Ambulation/Gait Assistance 5: Supervision;4: Min guard    Ambulation/Gait Assistance Details pt needing to pause several time to regroup walking sequence with poles    Ambulation Distance (Feet) 800 Feet   x1, plus around clinic with session   Assistive device Other (Comment)   bil walking poles   Gait Pattern Step-through pattern;Lateral trunk lean to  right;Lateral hip instability;Abducted - left;Abducted- right    Ambulation Surface Level;Unlevel;Indoor;Outdoor;Paved                 Balance Exercises - 08/18/21 1254       Balance Exercises: Standing   SLS with Vectors Foam/compliant surface;Upper extremity assist 1;Intermittent upper extremity assist;Other reps (comment);Limitations    SLS with Vectors Limitations standing on balance board with 2 tall cones in front: alternating forward foot taps, then lateral foot taps for ~10 reps each with single UE support on bar with min guard to min assist for balance. cues on maintaining a wide base of support and on weight shifting.    Rockerboard Anterior/posterior;Lateral;EO;EC;30 seconds;Other reps (comment);Intermittent UE support;Limitations    Rockerboard Limitations performed both ways on the balance board with no UE support, occasional touch to bars for balance: rocking the board with emphasis on tall posture with EO, progressing to EC. then holding the board steady for EC 30 sec's x 3 reps. min guard to min assist for balance. cues on posture and wieght shifting to assist with balance    Sit to Stand Standard surface;Without upper extremity support;Foam/compliant surface;Limitations    Sit to Stand Limitations seated at edge of mat with feet on airex: sit<>stands x 10 reps with no UE support. cues for full standing and controlled descent with min guard assist for safety.                 PT Short Term Goals - 07/25/21 1656       PT SHORT TERM GOAL #1   Title Pt will be independent with initial HEP    Baseline Lost paper but states he's been trying the balance exercises 07/25/21    Time 4    Period Weeks    Status Partially Met    Target Date 07/29/21      PT SHORT TERM GOAL #2   Title Pt will report at least 25% improvement in his dizziness    Baseline Pt states not as much as 25% improvement but notes last dizzy episode last Friday 07/25/21    Time 4    Period Weeks     Status Not Met    Target Date 07/29/21      PT SHORT TERM GOAL #3   Title PT will assess and provide LTGs for dynamic gait    Time 2    Period Weeks    Status Achieved    Target Date 07/29/21      PT SHORT  TERM GOAL #4   Title Pt will be able to maintain balance for at least 30 sec in all conditions of mCTSIB to demo improving sensory integration    Baseline M-CTSIB: situation 2 for 20.35 seconds, situation 4: 11 seconds; situation 2 for 30 sec, situation 4 for 18 sec 07/25/21    Time 4    Period Weeks    Status Partially Met    Target Date 07/15/21      PT SHORT TERM GOAL #5   Title Pt will have improved TUG time to </=12 sec to demo decreased fall risk    Baseline 14; 12 sec on 07/25/21 but minor LOB during turn    Time 4    Period Weeks    Status Achieved    Target Date 07/29/21               PT Long Term Goals - 07/06/21 0853       PT LONG TERM GOAL #1   Title Pt will be independent with final HEP (all LTGs due 08/26/21)    Time 8    Period Weeks    Status On-going      PT LONG TERM GOAL #2   Title Pt will report >50% improvement with his dizziness    Time 8    Period Weeks    Status On-going      PT LONG TERM GOAL #3   Title Pt will be able to amb >1000' indoors and outdoors without a/d for improved community mobility    Baseline Currently self limiting himself from house to the car and to appointments    Time 8    Period Weeks    Status On-going      PT LONG TERM GOAL #4   Title Pt will have improved FOTO score to at least 57    Baseline 43    Time 8    Period Weeks    Status On-going      PT LONG TERM GOAL #5   Title Pt will have improved FGA score to at least 22/30 to demonstrate decreased risk of falls    Baseline 15/30    Time 8    Period Weeks    Status New    Target Date 08/26/21      Additional Long Term Goals   Additional Long Term Goals Yes                   Plan - 08/18/21 1240     Clinical Impression Statement Today's  skilled session continued to focus on gait with trekking poles on various surfaces with cues needed on sequencing at times. Remainder of session continued to address balance training on compliant surfaces with up to min assist needed at times. No issues noted or reported in session. The pt is making progress and should benefit from continued PT to progress toward unmet goals.    Personal Factors and Comorbidities Age;Fitness;Comorbidity 1;Comorbidity 2;Comorbidity 3+    Comorbidities R TKA, Anxiety, arthritis, CML, depression, diabetes, GERD, HTN, hyperlipidemia, insomina, decreased L vision    Examination-Activity Limitations Bend;Stand;Stairs;Squat;Locomotion Level;Transfers    Examination-Participation Restrictions Community Activity;Yard Work;Cleaning    Stability/Clinical Decision Making Evolving/Moderate complexity    Rehab Potential Good    PT Frequency 2x / week    PT Duration 8 weeks    PT Treatment/Interventions ADLs/Self Care Home Management;Aquatic Therapy;Canalith Repostioning;Electrical Stimulation;Cryotherapy;Iontophoresis 52m/ml Dexamethasone;Moist Heat;DME Instruction;Gait training;Stair training;Functional mobility training;Therapeutic activities;Therapeutic exercise;Balance training;Neuromuscular re-education;Manual techniques;Patient/family education;Passive  range of motion;Dry needling;Taping;Vestibular;Spinal Manipulations    PT Next Visit Plan begin to check goals as pt has surgery coming up for shoulder replacement    PT Home Exercise Plan Standing on pillow eyes closed; forward bend with body rotation looking at 2 targets 2x10; forward bend with diagonal body movements looking at 2 targets 2x10    Consulted and Agree with Plan of Care Patient             Patient will benefit from skilled therapeutic intervention in order to improve the following deficits and impairments:  Abnormal gait, Difficulty walking, Dizziness, Decreased activity tolerance, Pain, Decreased range of  motion, Decreased balance, Decreased mobility, Decreased strength, Postural dysfunction, Improper body mechanics, Hypomobility  Visit Diagnosis: Unsteadiness on feet  Difficulty in walking, not elsewhere classified  Muscle weakness (generalized)     Problem List Patient Active Problem List   Diagnosis Date Noted   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   OSA on CPAP 10/30/2017   Hypogonadism, male 10/30/2017   Iron deficiency anemia due to chronic blood loss 11/20/2016   Erosive gastritis with hemorrhage    Melena    Acute blood loss anemia 09/27/2016   Acute upper GI bleed 09/27/2016   Abnormal liver diagnostic imaging 06/22/2016   Liver cirrhosis secondary to NASH ([place]) 05/03/2016   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 MRN: [id number] Date of Birth: [birth year]

## 2021-08-23 ENCOUNTER — Other Ambulatory Visit: Payer: Self-pay

## 2021-08-23 ENCOUNTER — Ambulatory Visit: Payer: Medicare Other

## 2021-08-23 DIAGNOSIS — M6281 Muscle weakness (generalized): Secondary | ICD-10-CM

## 2021-08-23 DIAGNOSIS — R42 Dizziness and giddiness: Secondary | ICD-10-CM

## 2021-08-23 DIAGNOSIS — R2681 Unsteadiness on feet: Secondary | ICD-10-CM

## 2021-08-23 DIAGNOSIS — R262 Difficulty in walking, not elsewhere classified: Secondary | ICD-10-CM

## 2021-08-23 NOTE — Therapy (Signed)
[facility] [address] [place], [place], [address] Phone: [phone]   Fax:  [phone]  Physical Therapy Treatment/Discharge Summary  Patient Details  Name: James [name]. MRN: [id number] Date of Birth: 12/07/1946 Referring Provider (PT): Leanna Battles, MD  PHYSICAL THERAPY DISCHARGE SUMMARY  Visits from Start of Care: 13  Current functional level related to goals / functional outcomes: See Clinical Impression Statement    Remaining deficits: Medium Fall Risk, Mild Dizziness   Education / Equipment: HEP Provided   Patient agrees to discharge. Patient goals were partially met. Patient is being discharged due to meeting the stated rehab goals.   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)    History   Portal vein thrombosis    Rhinitis    Situational depression    Sleep apnea    uses CPAP every night   Tubular  adenoma 12/10/2015   6 cecum polyps    Past Surgical History:  Procedure Laterality Date   CARDIAC CATHETERIZATION     greater 10 yrs ago, normal   CARPAL TUNNEL RELEASE     bilateral   COLONOSCOPY  06/2005   [facility] [place]   ESOPHAGOGASTRODUODENOSCOPY (EGD) WITH PROPOFOL N/A 09/28/2016   Procedure: ESOPHAGOGASTRODUODENOSCOPY (EGD) WITH PROPOFOL;  Surgeon: Gatha Mayer, MD;  [facility];  Service: Endoscopy;  Laterality: N/A;   IR GENERIC HISTORICAL  12/12/2016   IR US GUIDE VASC ACCESS RIGHT 12/12/2016 [name], MD [facility]   IR GENERIC HISTORICAL  12/12/2016   IR VENOGRAM HEPATIC W HEMODYNAMIC EVALUATION 12/12/2016 [name], MD [facility]   IR GENERIC HISTORICAL  12/12/2016   IR TRANSCATHETER BX 12/12/2016 [name], MD [facility]   ROTATOR CUFF REPAIR     right   TENDON REPAIR     left arm   TONSILLECTOMY     TOTAL KNEE ARTHROPLASTY Right 2010   WISDOM TOOTH EXTRACTION      There were no vitals filed for this visit.   Subjective Assessment - 08/23/21 0931     Pertinent History Anxiety, arthritis, CML, depression, diabetes, GERD, HTN, hyperlipidemia, insomina, macular degeneration, prior R TKA  Limitations Standing;Walking;House hold activities    How long can you sit comfortably? n/a    How long can you stand comfortably? has chronic back/hip pain    How long can you walk comfortably? House to car only due to not feeling comfortable; will use RW in grocery store    Diagnostic tests Awaiting MRI results for R shoulder    Patient Stated Goals Improve balance and decrease falls/unsteadiness/dizziness; wife would like pt to be able to walk/stand straighter              [facility] PT Assessment - 08/23/21 0001       Assessment   Medical Diagnosis R26.81 (ICD-10-CM) - Unsteadiness on feet  R42 (ICD-10-CM) - Dizziness and giddiness    Referring Provider (PT) Leanna Battles, MD      Observation/Other Assessments   Focus on  Therapeutic Outcomes (FOTO)  DPS: 53, DFS: 56.4      Functional Gait  Assessment   Gait assessed  Yes    Gait Level Surface Walks 20 ft in less than 7 sec but greater than 5.5 sec, uses assistive device, slower speed, mild gait deviations, or deviates 6-10 in outside of the 12 in walkway width.    Change in Gait Speed Able to change speed, demonstrates mild gait deviations, deviates 6-10 in outside of the 12 in walkway width, or no gait deviations, unable to achieve a major change in velocity, or uses a change in velocity, or uses an assistive device.    Gait with Horizontal Head Turns Performs head turns smoothly with slight change in gait velocity (eg, minor disruption to smooth gait path), deviates 6-10 in outside 12 in walkway width, or uses an assistive device.    Gait with Vertical Head Turns Performs head turns with no change in gait. Deviates no more than 6 in outside 12 in walkway width.    Gait and Pivot Turn Pivot turns safely within 3 sec and stops quickly with no loss of balance.    Step Over Obstacle Is able to step over one shoe box (4.5 in total height) without changing gait speed. No evidence of imbalance.    Gait with Narrow Base of Support Ambulates 4-7 steps.    Gait with Eyes Closed Walks 20 ft, slow speed, abnormal gait pattern, evidence for imbalance, deviates 10-15 in outside 12 in walkway width. Requires more than 9 sec to ambulate 20 ft.    Ambulating Backwards Walks 20 ft, uses assistive device, slower speed, mild gait deviations, deviates 6-10 in outside 12 in walkway width.    Steps Alternating feet, must use rail.    Total Score 20    FGA comment: 20/30                [facility] Adult PT Treatment/Exercise - 08/23/21 0001       Ambulation/Gait   Ambulation/Gait Yes    Ambulation/Gait Assistance 5: Supervision    Ambulation/Gait Assistance Details Patient ambulating x 1020 ft on outdoors surfaces, PT providing intermittent cues for sequencing with treking pole     Ambulation Distance (Feet) 1020 Feet    Assistive device --   Bilateral Walking Sticks   Gait Pattern Step-through pattern;Lateral trunk lean to right;Lateral hip instability;Abducted - left;Abducted- right    Ambulation Surface Level;Indoor;Unlevel;Outdoor;Paved             Vestibular Treatment/Exercise - 08/23/21 0001       Vestibular Treatment/Exercise   Vestibular Treatment Provided Gaze    Gaze Exercises  X1 Viewing Horizontal;X1 Viewing Vertical      X1 Viewing Horizontal   Foot Position standing feet together    Reps 2    Comments x 60 seconds; no dizziness      X1 Viewing Vertical   Foot Position standing feet together    Reps 2    Comments x 60 seconds; no dizziness            Reviewed and Updated HEP during today's session:   Gaze Stabilization: Standing Feet Together    Feet together, keeping eyes on target on wall 3-4 feet away, tilt head down 15-30 and move head side to side for 60 seconds. Repeat while moving head up and down for 60 seconds. Do 2-3 sessions per day.    Gaze Stabilization: Tip Card  1.Target must remain in focus, not blurry, and appear stationary while head is in motion. 2.Perform exercises with small head movements (45 to either side of midline). 3.Increase speed of head motion so long as target is in focus. 4.If you wear eyeglasses, be sure you can see target through lens (therapist will give specific instructions for bifocal / progressive lenses). 5.These exercises may provoke dizziness or nausea. Work through these symptoms. If too dizzy, slow head movement slightly. Rest between each exercise. 6.Exercises demand concentration; avoid distractions. 7.For safety, perform standing exercises close to a counter, wall, corner, or next to someone.  Copyright  VHI. All rights reserved.  Feet Together, Head Motion - Eyes Open and Eyes Closed    With eyes open, feet together, move head slowly: up and down x 10 times. Followed by  right and left x 10 times. Then with next set complete both again with eyes closed.  Repeat 2 times per session. Do 1 sessions per day.    Balance: Eyes Closed - Bilateral (Varied Surfaces)    Stand, feet shoulder width, close eyes. Maintain balance 30 seconds. Repeat 3 times per set. Do 5 sessions per week. Repeat on compliant surface: pillow/foam.  Copyright  VHI. All rights reserved.       PT Education - 08/23/21 0931     Education Details Updated HEP Provided; Progress toward LTGs    Person(s) Educated Patient    Methods Explanation    Comprehension Verbalized understanding              PT Short Term Goals - 07/25/21 1656       PT SHORT TERM GOAL #1   Title Pt will be independent with initial HEP    Baseline Lost paper but states he's been trying the balance exercises 07/25/21    Time 4    Period Weeks    Status Partially Met    Target Date 07/29/21      PT SHORT TERM GOAL #2   Title Pt will report at least 25% improvement in his dizziness    Baseline Pt states not as much as 25% improvement but notes last dizzy episode last Friday 07/25/21    Time 4    Period Weeks    Status Not Met    Target Date 07/29/21      PT SHORT TERM GOAL #3   Title PT will assess and provide LTGs for dynamic gait    Time 2    Period Weeks    Status Achieved    Target Date 07/29/21      PT SHORT TERM GOAL #4   Title Pt will be able to maintain balance for at least  30 sec in all conditions of mCTSIB to demo improving sensory integration    Baseline M-CTSIB: situation 2 for 20.35 seconds, situation 4: 11 seconds; situation 2 for 30 sec, situation 4 for 18 sec 07/25/21    Time 4    Period Weeks    Status Partially Met    Target Date 07/15/21      PT SHORT TERM GOAL #5   Title Pt will have improved TUG time to </=12 sec to demo decreased fall risk    Baseline 14; 12 sec on 07/25/21 but minor LOB during turn    Time 4    Period Weeks    Status Achieved    Target Date 07/29/21                PT Long Term Goals - 08/23/21 0935       PT LONG TERM GOAL #1   Title Pt will be independent with final HEP (all LTGs due 08/26/21)    Baseline reports independence with HEP    Time 8    Period Weeks    Status Achieved      PT LONG TERM GOAL #2   Title Pt will report >50% improvement with his dizziness    Baseline reports 60-65% improvement    Time 8    Period Weeks    Status Achieved      PT LONG TERM GOAL #3   Title Pt will be able to amb >1000' indoors and outdoors without a/d for improved community mobility    Baseline Currently self limiting himself from house to the car and to appointments; 1020 indoors/outdoors with bilateral treking poles with supervision    Time 8    Period Weeks    Status Partially Met      PT LONG TERM GOAL #4   Title Pt will have improved FOTO score to at least 57    Baseline 43; 53    Time 8    Period Weeks    Status Not Met      PT LONG TERM GOAL #5   Title Pt will have improved FGA score to at least 22/30 to demonstrate decreased risk of falls    Baseline 15/30; 20/30    Time 8    Period Weeks    Status Not Met                   Plan - 08/23/21 1103     Clinical Impression Statement Due to recent upcoming surgery and patient needing to cancel visit for thursday, PT assessed patient's progress toward LTG. Patient able to meet/partially meet LTG #1-3 demonstrating improved ability to ambulate outdoors and reduction in dizziness allowing for improved tolerance for activities. Patient improved FGA to 20/30 demonstrating medium fall risk at this time. Reviewed and updated HEP to patient's tolerance for completion upon d/c. Pt agreeable to d/c today.    Personal Factors and Comorbidities Age;Fitness;Comorbidity 1;Comorbidity 2;Comorbidity 3+    Comorbidities R TKA, Anxiety, arthritis, CML, depression, diabetes, GERD, HTN, hyperlipidemia, insomina, decreased L vision    Examination-Activity Limitations  Bend;Stand;Stairs;Squat;Locomotion Level;Transfers    Examination-Participation Restrictions Community Activity;Yard Work;Cleaning    Stability/Clinical Decision Making Evolving/Moderate complexity    Rehab Potential Good    PT Frequency 2x / week    PT Duration 8 weeks    PT Treatment/Interventions ADLs/Self Care Home Management;Aquatic Therapy;Canalith Repostioning;Electrical Stimulation;Cryotherapy;Iontophoresis 31m/ml Dexamethasone;Moist Heat;DME Instruction;Gait training;Stair training;Functional mobility training;Therapeutic activities;Therapeutic exercise;Balance training;Neuromuscular re-education;Manual techniques;Patient/family education;Passive range of  motion;Dry needling;Taping;Vestibular;Spinal Manipulations    PT Next Visit Plan d/c today.    PT Home Exercise Plan Standing on pillow eyes closed; forward bend with body rotation looking at 2 targets 2x10; forward bend with diagonal body movements looking at 2 targets 2x10    Consulted and Agree with Plan of Care Patient             Patient will benefit from skilled therapeutic intervention in order to improve the following deficits and impairments:  Abnormal gait, Difficulty walking, Dizziness, Decreased activity tolerance, Pain, Decreased range of motion, Decreased balance, Decreased mobility, Decreased strength, Postural dysfunction, Improper body mechanics, Hypomobility  Visit Diagnosis: Unsteadiness on feet  Difficulty in walking, not elsewhere classified  Muscle weakness (generalized)  Dizziness and giddiness     Problem List Patient Active Problem List   Diagnosis Date Noted   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   OSA on CPAP 10/30/2017   Hypogonadism, male 10/30/2017   Iron deficiency anemia due to chronic blood loss 11/20/2016   Erosive gastritis with hemorrhage    Melena    Acute blood loss anemia 09/27/2016   Acute upper GI bleed 09/27/2016    Abnormal liver diagnostic imaging 06/22/2016   Liver cirrhosis secondary to NASH ([place]) 05/03/2016   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 [place], [place], [address] Phone: [phone]   Fax:  [phone]  Name: Oskar Cretella. MRN: [id number] Date of Birth: Mar 07, 1946

## 2021-08-23 NOTE — Patient Instructions (Addendum)
Gaze Stabilization: Standing Feet Together    Feet together, keeping eyes on target on wall 3-4 feet away, tilt head down 15-30 and move head side to side for 60 seconds. Repeat while moving head up and down for 60 seconds. Do 2-3 sessions per day.    Gaze Stabilization: Tip Card  1.Target must remain in focus, not blurry, and appear stationary while head is in motion. 2.Perform exercises with small head movements (45 to either side of midline). 3.Increase speed of head motion so long as target is in focus. 4.If you wear eyeglasses, be sure you can see target through lens (therapist will give specific instructions for bifocal / progressive lenses). 5.These exercises may provoke dizziness or nausea. Work through these symptoms. If too dizzy, slow head movement slightly. Rest between each exercise. 6.Exercises demand concentration; avoid distractions. 7.For safety, perform standing exercises close to a counter, wall, corner, or next to someone.  Copyright  VHI. All rights reserved.      Feet Together, Head Motion - Eyes Open and Eyes Closed    With eyes open, feet together, move head slowly: up and down x 10 times. Followed by right and left x 10 times. Then with next set complete both again with eyes closed.  Repeat 2 times per session. Do 1 sessions per day.   Balance: Eyes Closed - Bilateral (Varied Surfaces)    Stand, feet shoulder width, close eyes. Maintain balance 30 seconds. Repeat 3 times per set. Do 5 sessions per week. Repeat on compliant surface: pillow/foam.  Copyright  VHI. All rights reserved.

## 2021-08-24 ENCOUNTER — Encounter (HOSPITAL_COMMUNITY)
Admission: RE | Admit: 2021-08-24 | Discharge: 2021-08-24 | Disposition: A | Discharge status: Home / Self Care | Payer: Medicare Other | Source: Ambulatory Visit | Attending: Orthopedic Surgery | Admitting: Orthopedic Surgery

## 2021-08-24 ENCOUNTER — Other Ambulatory Visit: Payer: Self-pay

## 2021-08-24 ENCOUNTER — Other Ambulatory Visit (HOSPITAL_COMMUNITY): Payer: Medicare Other

## 2021-08-24 ENCOUNTER — Encounter (HOSPITAL_COMMUNITY): Payer: Self-pay

## 2021-08-24 DIAGNOSIS — Z01818 Encounter for other preprocedural examination: Secondary | ICD-10-CM | POA: Diagnosis present

## 2021-08-24 DIAGNOSIS — M12511 Traumatic arthropathy, right shoulder: Secondary | ICD-10-CM | POA: Insufficient documentation

## 2021-08-24 LAB — GLUCOSE, CAPILLARY: Glucose-Capillary: 78 mg/dL (ref 70–99)

## 2021-08-24 LAB — BASIC METABOLIC PANEL
Anion gap: 4 — ABNORMAL LOW (ref 5–15)
BUN: 13 mg/dL (ref 8–23)
CO2: 26 mmol/L (ref 22–32)
Calcium: 10.5 mg/dL — ABNORMAL HIGH (ref 8.9–10.3)
Chloride: 109 mmol/L (ref 98–111)
Creatinine, Ser: 0.9 mg/dL (ref 0.61–1.24)
GFR, Estimated: >60 mL/min (ref >60)
Glucose, Bld: 85 mg/dL (ref 70–99)
Potassium: 4.3 mmol/L (ref 3.5–5.1)
Sodium: 139 mmol/L (ref 135–145)

## 2021-08-24 LAB — CBC
HCT: 48.8 % (ref 39.0–52.0)
Hemoglobin: 15.7 g/dL (ref 13.0–17.0)
MCH: 30.5 pg (ref 26.0–34.0)
MCHC: 32.2 g/dL (ref 30.0–36.0)
MCV: 94.9 fL (ref 80.0–100.0)
Platelets: 222 10*3/uL (ref 150–400)
RBC: 5.14 MIL/uL (ref 4.22–5.81)
RDW: 16.3 % — ABNORMAL HIGH (ref 11.5–15.5)
WBC: 5.8 10*3/uL (ref 4.0–10.5)
nRBC: 0 % (ref 0.0–0.2)

## 2021-08-24 LAB — HEMOGLOBIN A1C
Hgb A1c MFr Bld: 6 % — ABNORMAL HIGH (ref 4.8–5.6)
Mean Plasma Glucose: 125.5 mg/dL

## 2021-08-24 LAB — SURGICAL PCR SCREEN
MRSA, PCR: NEGATIVE
Staphylococcus aureus: NEGATIVE

## 2021-08-24 NOTE — Progress Notes (Addendum)
COVID test Completed: NA   PCP -Dr D. Zachary - [facility] Cardiologist -  Chest x-ray - no EKG - 08/24/21-chart Stress Test - 01/31/19-epic ECHO - 04/17/19 Cardiac Cath - 2011-epic Pacemaker/ICD device last checked:  Sleep Study - yes CPAP - yes  Fasting Blood Sugar - 90-135 Checks Blood Sugar _____ times a day. Three times a week  Blood Thinner Instructions:Xarelto/ Dr. [name] Aspirin Instructions:Stop 2 days prior to DOS/ Dr. [name] Last Dose:08/29/21  Anesthesia review: yes  Patient denies shortness of breath, fever, cough and chest pain at PAT appointment yes Pt reports no SOB with any activities. He hasn't been able to work out but has been doing physical therapy for an unsteady gait.    Patient verbalized understanding of instructions that were given to them at the PAT appointment. Patient was also instructed that they will need to review over the PAT instructions again at home before surgery. yes

## 2021-08-26 ENCOUNTER — Ambulatory Visit: Payer: Medicare Other

## 2021-08-26 NOTE — Progress Notes (Signed)
Anesthesia Chart Review   Case: [id number] Date/Time: 09/01/21 0945   Procedure: REVERSE SHOULDER ARTHROPLASTY (Right: Shoulder) - 159mn,   Anesthesia type: General   Pre-op diagnosis: Right shoulder rotator cuff tear arthropathy   Location: [facility] ROOM 06 / [facility]   Surgeons: SJustice Britain MD       DISCUSSION:75 y.o. former smoker with h/o HTN, GERD, DM II, sleep apnea, CML, polycythemia vera, right shoulder rotator cuff tear scheduled for above procedure 09/01/2021 with Dr. KJustice Britain   Pt last seen by oncology 05/26/2021. Per OV note Dr. EMarin Olpaware of and states pt can proceed. Per Dr. EMarin Olppt to hold Xarelto 2 days prior to surgery.   Anticipate pt can proceed with planned procedure barring acute status change.   VS: BP (!) 163/82   Pulse 73   Temp 36.9 C (Oral)   Resp 20   Ht 5' 6"  (1.676 m)   Wt 76.2 kg   SpO2 97%   BMI 27.12 kg/m   PROVIDERS: PLeanna Battles MD is PCP    LABS: Labs reviewed: Acceptable for surgery. (all labs ordered are listed, but only abnormal results are displayed)  Labs Reviewed  HEMOGLOBIN A1C - Abnormal; Notable for the following components:      Result Value   Hgb A1c MFr Bld 6.0 (*)    All other components within normal limits  BASIC METABOLIC PANEL - Abnormal; Notable for the following components:   Calcium 10.5 (*)    Anion gap 4 (*)    All other components within normal limits  CBC - Abnormal; Notable for the following components:   RDW 16.3 (*)    All other components within normal limits  SURGICAL PCR SCREEN  GLUCOSE, CAPILLARY     IMAGES:   EKG: 08/24/2021 Rate 72 bpm  Normal sinus rhythm with sinus arrhythmia Left axis deviation Inferior infarct , age undetermined Cannot rule out Anterior infarct , age undetermined Abnormal ECG  CV: Lexiscan myoview stress test 01/31/2019:  1. Lexiscan stress test was performed. Exercise capacity was not assessed. Stress symptoms included dizziness. Resting blood pressure was  112/68 mmHg and peak effect blood pressure was 106/62 mmHg. The resting and stress electrocardiogram demonstrated normal sinus rhythm, normal resting conduction, no resting arrhythmias and normal rest repolarization.  Stress EKG is non diagnostic for ischemia as it is a pharmacologic stress.  2. The overall quality of the study is good. There is no evidence of abnormal lung activity. Stress and rest SPECT images demonstrate homogeneous tracer distribution throughout the myocardium. Gated SPECT imaging reveals normal myocardial thickening and wall motion. The left ventricular ejection fraction was normal (65%).   3. Low risk study.    Past Medical History:  Diagnosis Date   Anxiety    Arthritis    Clotting disorder ([place]    C[place]ocytic [place]) ([place] 12/16/2020   Depression    DM (diabetes mellitus) ([facility])    GERD (gastroesophageal reflux disease)    Hiatal hernia    History of shingles 04/03/2014   HTN (hypertension)    Hyperlipidemia    Insomnia    resolved by using CPAP   Iron deficiency anemia due to chronic blood loss 11/20/2016   Macular degeneration    Rt eye   Nocturia more than twice per night 11/11/2014   For 6 month, 5 nocturias a night.    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)    History   Portal vein thrombosis    Rhinitis  Situational depression    Sleep apnea    uses CPAP every night   Tubular adenoma 12/10/2015   6 cecum polyps    Past Surgical History:  Procedure Laterality Date   CARDIAC CATHETERIZATION     greater 10 yrs ago, normal   CARPAL TUNNEL RELEASE     bilateral   COLONOSCOPY  06/2005   [facility] [place]   ESOPHAGOGASTRODUODENOSCOPY (EGD) WITH PROPOFOL N/A 09/28/2016   Procedure: ESOPHAGOGASTRODUODENOSCOPY (EGD) WITH PROPOFOL;  Surgeon: Gatha Mayer, MD;  [facility];  Service: Endoscopy;  Laterality: N/A;   IR GENERIC HISTORICAL  12/12/2016   IR US GUIDE VASC ACCESS RIGHT 12/12/2016 [name], MD [facility]    IR GENERIC HISTORICAL  12/12/2016   IR VENOGRAM HEPATIC W HEMODYNAMIC EVALUATION 12/12/2016 [name], MD [facility]   IR GENERIC HISTORICAL  12/12/2016   IR TRANSCATHETER BX 12/12/2016 [name], MD [facility]   ROTATOR CUFF REPAIR     right   TENDON REPAIR     left arm   TONSILLECTOMY     TOTAL KNEE ARTHROPLASTY Right 2010   WISDOM TOOTH EXTRACTION      MEDICATIONS:  buPROPion (WELLBUTRIN XL) 300 MG 24 hr tablet   cephALEXin (KEFLEX) 500 MG capsule   dasatinib (SPRYCEL) 70 MG tablet   desmopressin (DDAVP) 0.2 MG tablet   fluticasone (FLONASE) 50 MCG/ACT nasal spray   glucose blood test strip   Lidocaine 4 % PTCH   LORazepam (ATIVAN) 0.5 MG tablet   metFORMIN (GLUCOPHAGE-XR) 500 MG 24 hr tablet   moxifloxacin (VIGAMOX) 0.5 % ophthalmic solution   polyethylene glycol (MIRALAX / GLYCOLAX) 17 g packet   PRESCRIPTION MEDICATION   rosuvastatin (CRESTOR) 20 MG tablet   sildenafil (VIAGRA) 50 MG tablet   testosterone cypionate (DEPOTESTOSTERONE CYPIONATE) 200 MG/ML injection   TRULICITY 1.5 EH/6.3JS SOPN   vitamin B-12 (CYANOCOBALAMIN) 1000 MCG tablet   XARELTO 10 MG TABS tablet   zolpidem (AMBIEN) 10 MG tablet   No current facility-administered medications for this encounter.   Konrad Felix Ward, PA-C [facility] [phone]

## 2021-09-01 ENCOUNTER — Ambulatory Visit (HOSPITAL_COMMUNITY): Payer: Medicare Other | Admitting: Physician Assistant

## 2021-09-01 ENCOUNTER — Encounter (HOSPITAL_COMMUNITY)
Admission: RE | Disposition: A | Discharge status: Home / Self Care | Payer: Self-pay | Source: Home / Self Care | Attending: Orthopedic Surgery

## 2021-09-01 ENCOUNTER — Ambulatory Visit (HOSPITAL_COMMUNITY)
Admission: RE | Admit: 2021-09-01 | Discharge: 2021-09-01 | Disposition: A | Discharge status: Home / Self Care | Payer: Medicare Other | Source: Home / Self Care | Attending: Orthopedic Surgery | Admitting: Orthopedic Surgery

## 2021-09-01 ENCOUNTER — Ambulatory Visit (HOSPITAL_COMMUNITY): Payer: Medicare Other | Admitting: Certified Registered Nurse Anesthetist

## 2021-09-01 ENCOUNTER — Encounter (HOSPITAL_COMMUNITY): Payer: Self-pay | Admitting: Orthopedic Surgery

## 2021-09-01 DIAGNOSIS — R410 Disorientation, unspecified: Secondary | ICD-10-CM | POA: Insufficient documentation

## 2021-09-01 DIAGNOSIS — Z87891 Personal history of nicotine dependence: Secondary | ICD-10-CM | POA: Insufficient documentation

## 2021-09-01 DIAGNOSIS — G8929 Other chronic pain: Secondary | ICD-10-CM | POA: Insufficient documentation

## 2021-09-01 DIAGNOSIS — G9340 Encephalopathy, unspecified: Secondary | ICD-10-CM | POA: Diagnosis not present

## 2021-09-01 DIAGNOSIS — M75101 Unspecified rotator cuff tear or rupture of right shoulder, not specified as traumatic: Secondary | ICD-10-CM | POA: Insufficient documentation

## 2021-09-01 DIAGNOSIS — Z7984 Long term (current) use of oral hypoglycemic drugs: Secondary | ICD-10-CM | POA: Insufficient documentation

## 2021-09-01 DIAGNOSIS — M13811 Other specified arthritis, right shoulder: Secondary | ICD-10-CM | POA: Insufficient documentation

## 2021-09-01 DIAGNOSIS — Z79899 Other long term (current) drug therapy: Secondary | ICD-10-CM | POA: Insufficient documentation

## 2021-09-01 DIAGNOSIS — Z7901 Long term (current) use of anticoagulants: Secondary | ICD-10-CM | POA: Insufficient documentation

## 2021-09-01 DIAGNOSIS — J690 Pneumonitis due to inhalation of food and vomit: Secondary | ICD-10-CM | POA: Diagnosis not present

## 2021-09-01 DIAGNOSIS — Z96651 Presence of right artificial knee joint: Secondary | ICD-10-CM | POA: Insufficient documentation

## 2021-09-01 HISTORY — PX: REVERSE SHOULDER ARTHROPLASTY: SHX5054

## 2021-09-01 LAB — GLUCOSE, CAPILLARY
Glucose-Capillary: 97 mg/dL (ref 70–99)
Glucose-Capillary: 107 mg/dL — ABNORMAL HIGH (ref 70–99)

## 2021-09-01 SURGERY — ARTHROPLASTY, SHOULDER, TOTAL, REVERSE
Anesthesia: General | Site: Shoulder | Laterality: Right

## 2021-09-01 MED ORDER — LACTATED RINGERS IV BOLUS
250.0000 mL | Freq: Once | INTRAVENOUS | Status: AC
  Administered 2021-09-01: 250 mL via INTRAVENOUS

## 2021-09-01 MED ORDER — FENTANYL CITRATE (PF) 100 MCG/2ML IJ SOLN
INTRAMUSCULAR | Status: DC | PRN
  Administered 2021-09-01: 50 ug via INTRAVENOUS

## 2021-09-01 MED ORDER — ACETAMINOPHEN 160 MG/5ML PO SOLN: 325.0000 mg | Freq: Once | ORAL | Status: DC | PRN

## 2021-09-01 MED ORDER — LACTATED RINGERS IV SOLN: INTRAVENOUS | Status: DC

## 2021-09-01 MED ORDER — MIDAZOLAM HCL 2 MG/2ML IJ SOLN: 1.0000 mg | INTRAMUSCULAR | Status: DC

## 2021-09-01 MED ORDER — TRANEXAMIC ACID-NACL 1000-0.7 MG/100ML-% IV SOLN
1000.0000 mg | INTRAVENOUS | Status: AC
  Administered 2021-09-01: 1000 mg via INTRAVENOUS

## 2021-09-01 MED ORDER — ONDANSETRON HCL 4 MG PO TABS: 4.0000 mg | ORAL_TABLET | Freq: Three times a day (TID) | ORAL | 0 refills | Status: DC | PRN

## 2021-09-01 MED ORDER — VANCOMYCIN HCL 1000 MG IV SOLR
INTRAVENOUS | Status: DC | PRN
  Administered 2021-09-01: 1000 mg via TOPICAL

## 2021-09-01 MED ORDER — ONDANSETRON HCL 4 MG/2ML IJ SOLN
INTRAMUSCULAR | Status: AC
  Filled 2021-09-01: qty 2

## 2021-09-01 MED ORDER — FENTANYL CITRATE PF 50 MCG/ML IJ SOSY
PREFILLED_SYRINGE | INTRAMUSCULAR | Status: AC
  Filled 2021-09-01: qty 1

## 2021-09-01 MED ORDER — AMISULPRIDE (ANTIEMETIC) 5 MG/2ML IV SOLN: 10.0000 mg | Freq: Once | INTRAVENOUS | Status: DC | PRN

## 2021-09-01 MED ORDER — CEFAZOLIN SODIUM-DEXTROSE 2-4 GM/100ML-% IV SOLN
2.0000 g | INTRAVENOUS | Status: AC
  Administered 2021-09-01: 2 g via INTRAVENOUS

## 2021-09-01 MED ORDER — BUPIVACAINE HCL (PF) 0.5 % IJ SOLN
INTRAMUSCULAR | Status: DC | PRN
  Administered 2021-09-01: 15 mL via PERINEURAL

## 2021-09-01 MED ORDER — BUPIVACAINE LIPOSOME 1.3 % IJ SUSP
INTRAMUSCULAR | Status: DC | PRN
  Administered 2021-09-01: 10 mL via PERINEURAL

## 2021-09-01 MED ORDER — HYDROMORPHONE HCL 1 MG/ML IJ SOLN: 0.2500 mg | INTRAMUSCULAR | Status: DC | PRN

## 2021-09-01 MED ORDER — ACETAMINOPHEN 10 MG/ML IV SOLN: 1000.0000 mg | Freq: Once | INTRAVENOUS | Status: DC | PRN

## 2021-09-01 MED ORDER — DEXAMETHASONE SODIUM PHOSPHATE 10 MG/ML IJ SOLN
INTRAMUSCULAR | Status: DC | PRN
  Administered 2021-09-01: 8 mg via INTRAVENOUS

## 2021-09-01 MED ORDER — FENTANYL CITRATE (PF) 100 MCG/2ML IJ SOLN
INTRAMUSCULAR | Status: AC
  Filled 2021-09-01: qty 2

## 2021-09-01 MED ORDER — CHLORHEXIDINE GLUCONATE 0.12 % MT SOLN
15.0000 mL | Freq: Once | OROMUCOSAL | Status: AC
  Administered 2021-09-01: 15 mL via OROMUCOSAL

## 2021-09-01 MED ORDER — OXYCODONE-ACETAMINOPHEN 5-325 MG PO TABS: 1.0000 | ORAL_TABLET | ORAL | 0 refills | Status: DC | PRN

## 2021-09-01 MED ORDER — LACTATED RINGERS IV BOLUS
500.0000 mL | Freq: Once | INTRAVENOUS | Status: AC
  Administered 2021-09-01: 500 mL via INTRAVENOUS

## 2021-09-01 MED ORDER — STERILE WATER FOR IRRIGATION IR SOLN
Status: DC | PRN
  Administered 2021-09-01: 1000 mL

## 2021-09-01 MED ORDER — TRANEXAMIC ACID 1000 MG/10ML IV SOLN: 1000.0000 mg | INTRAVENOUS | Status: DC

## 2021-09-01 MED ORDER — ACETAMINOPHEN 325 MG PO TABS: 325.0000 mg | ORAL_TABLET | Freq: Once | ORAL | Status: DC | PRN

## 2021-09-01 MED ORDER — ROCURONIUM BROMIDE 10 MG/ML (PF) SYRINGE
PREFILLED_SYRINGE | INTRAVENOUS | Status: DC | PRN
  Administered 2021-09-01: 50 mg via INTRAVENOUS

## 2021-09-01 MED ORDER — PROPOFOL 10 MG/ML IV BOLUS
INTRAVENOUS | Status: DC | PRN
  Administered 2021-09-01: 140 mg via INTRAVENOUS

## 2021-09-01 MED ORDER — SUGAMMADEX SODIUM 200 MG/2ML IV SOLN
INTRAVENOUS | Status: DC | PRN
  Administered 2021-09-01: 200 mg via INTRAVENOUS

## 2021-09-01 MED ORDER — DEXAMETHASONE SODIUM PHOSPHATE 10 MG/ML IJ SOLN
INTRAMUSCULAR | Status: AC
  Filled 2021-09-01: qty 1

## 2021-09-01 MED ORDER — MIDAZOLAM HCL 2 MG/2ML IJ SOLN
INTRAMUSCULAR | Status: AC
  Filled 2021-09-01: qty 2

## 2021-09-01 MED ORDER — VANCOMYCIN HCL 1000 MG IV SOLR
INTRAVENOUS | Status: AC
  Filled 2021-09-01: qty 20

## 2021-09-01 MED ORDER — PROMETHAZINE HCL 25 MG/ML IJ SOLN: 6.2500 mg | INTRAMUSCULAR | Status: DC | PRN

## 2021-09-01 MED ORDER — CEFAZOLIN SODIUM-DEXTROSE 2-4 GM/100ML-% IV SOLN
INTRAVENOUS | Status: AC
  Filled 2021-09-01: qty 100

## 2021-09-01 MED ORDER — ONDANSETRON HCL 4 MG/2ML IJ SOLN
INTRAMUSCULAR | Status: DC | PRN
  Administered 2021-09-01: 4 mg via INTRAVENOUS

## 2021-09-01 MED ORDER — PHENYLEPHRINE 40 MCG/ML (10ML) SYRINGE FOR IV PUSH (FOR BLOOD PRESSURE SUPPORT)
PREFILLED_SYRINGE | INTRAVENOUS | Status: DC | PRN
  Administered 2021-09-01: 80 ug via INTRAVENOUS

## 2021-09-01 MED ORDER — EPHEDRINE 5 MG/ML INJ
INTRAVENOUS | Status: AC
  Filled 2021-09-01: qty 5

## 2021-09-01 MED ORDER — TRANEXAMIC ACID 1000 MG/10ML IV SOLN
2000.0000 mg | Freq: Once | INTRAVENOUS | Status: DC
  Filled 2021-09-01: qty 20

## 2021-09-01 MED ORDER — ORAL CARE MOUTH RINSE: 15.0000 mL | Freq: Once | OROMUCOSAL | Status: AC

## 2021-09-01 MED ORDER — PHENYLEPHRINE HCL-NACL 20-0.9 MG/250ML-% IV SOLN
INTRAVENOUS | Status: DC | PRN
  Administered 2021-09-01: 25 ug/min via INTRAVENOUS

## 2021-09-01 MED ORDER — TRANEXAMIC ACID-NACL 1000-0.7 MG/100ML-% IV SOLN
INTRAVENOUS | Status: AC
  Filled 2021-09-01: qty 100

## 2021-09-01 MED ORDER — LIDOCAINE 2% (20 MG/ML) 5 ML SYRINGE
INTRAMUSCULAR | Status: DC | PRN
  Administered 2021-09-01: 20 mg via INTRAVENOUS

## 2021-09-01 MED ORDER — 0.9 % SODIUM CHLORIDE (POUR BTL) OPTIME
TOPICAL | Status: DC | PRN
  Administered 2021-09-01: 1000 mL

## 2021-09-01 MED ORDER — FENTANYL CITRATE PF 50 MCG/ML IJ SOSY
50.0000 ug | PREFILLED_SYRINGE | INTRAMUSCULAR | Status: DC
  Administered 2021-09-01: 50 ug via INTRAVENOUS

## 2021-09-01 MED ORDER — CYCLOBENZAPRINE HCL 10 MG PO TABS: 10.0000 mg | ORAL_TABLET | Freq: Three times a day (TID) | ORAL | 1 refills | Status: DC | PRN

## 2021-09-01 MED ORDER — PROPOFOL 10 MG/ML IV BOLUS
INTRAVENOUS | Status: AC
  Filled 2021-09-01: qty 20

## 2021-09-01 MED ORDER — MEPERIDINE HCL 50 MG/ML IJ SOLN: 6.2500 mg | INTRAMUSCULAR | Status: DC | PRN

## 2021-09-01 SURGICAL SUPPLY — 73 items
ADH SKN CLS APL DERMABOND .7 (GAUZE/BANDAGES/DRESSINGS) ×1
AID PSTN UNV HD RSTRNT DISP (MISCELLANEOUS) ×1
BAG COUNTER SPONGE SURGICOUNT (BAG) IMPLANT
BAG SPEC THK2 15X12 ZIP CLS (MISCELLANEOUS) ×1
BAG SPNG CNTER NS LX DISP (BAG)
BAG ZIPLOCK 12X15 (MISCELLANEOUS) ×2 IMPLANT
BLADE SAW SGTL 83.5X18.5 (BLADE) ×2 IMPLANT
BSPLAT GLND +2X24 MDLR (Joint) ×1 IMPLANT
COOLER ICEMAN CLASSIC (MISCELLANEOUS) ×2 IMPLANT
COVER BACK TABLE 60X90IN (DRAPES) ×2 IMPLANT
COVER SURGICAL LIGHT HANDLE (MISCELLANEOUS) ×2 IMPLANT
CUP SUT UNIV REVERS 39 NEU (Shoulder) ×2 IMPLANT
DERMABOND ADVANCED (GAUZE/BANDAGES/DRESSINGS) ×1
DERMABOND ADVANCED .7 DNX12 (GAUZE/BANDAGES/DRESSINGS) ×1 IMPLANT
DRAPE INCISE IOBAN 66X45 STRL (DRAPES) IMPLANT
DRAPE ORTHO SPLIT 77X108 STRL (DRAPES) ×4
DRAPE SHEET LG 3/4 BI-LAMINATE (DRAPES) ×2 IMPLANT
DRAPE SURG 17X11 SM STRL (DRAPES) ×2 IMPLANT
DRAPE SURG ORHT 6 SPLT 77X108 (DRAPES) ×2 IMPLANT
DRAPE TOP 10253 STERILE (DRAPES) ×2 IMPLANT
DRAPE U-SHAPE 47X51 STRL (DRAPES) ×2 IMPLANT
DRESSING AQUACEL AG SP 3.5X6 (GAUZE/BANDAGES/DRESSINGS) ×1 IMPLANT
DRSG AQUACEL AG ADV 3.5X 6 (GAUZE/BANDAGES/DRESSINGS) ×2 IMPLANT
DRSG AQUACEL AG ADV 3.5X10 (GAUZE/BANDAGES/DRESSINGS) IMPLANT
DRSG AQUACEL AG SP 3.5X6 (GAUZE/BANDAGES/DRESSINGS) ×2 IMPLANT
DURAPREP 26ML APPLICATOR (WOUND CARE) ×2 IMPLANT
ELECT BLADE TIP CTD 4 INCH (ELECTRODE) ×2 IMPLANT
ELECT REM PT RETURN 15FT ADLT (MISCELLANEOUS) ×2 IMPLANT
FACESHIELD WRAPAROUND (MASK) ×8 IMPLANT
GLENOID UNI REV MOD 24 +2 LAT (Joint) ×2 IMPLANT
GLENOSPHERE 39+4 LAT/24 UNI RV (Joint) ×2 IMPLANT
GLOVE SRG 8 PF TXTR STRL LF DI (GLOVE) ×1 IMPLANT
GLOVE SURG ENC MOIS LTX SZ7 (GLOVE) ×2 IMPLANT
GLOVE SURG ENC MOIS LTX SZ7.5 (GLOVE) ×2 IMPLANT
GLOVE SURG UNDER POLY LF SZ7 (GLOVE) ×2 IMPLANT
GLOVE SURG UNDER POLY LF SZ8 (GLOVE) ×2
GOWN STRL REUS W/TWL LRG LVL3 (GOWN DISPOSABLE) ×4 IMPLANT
INSERT HUMERAL MED 39/ +3 (Shoulder) ×1 IMPLANT
INSERT MEDIUM HUMERAL 39/ +3 (Shoulder) ×2 IMPLANT
KIT BASIN OR (CUSTOM PROCEDURE TRAY) ×2 IMPLANT
KIT TURNOVER KIT A (KITS) ×2 IMPLANT
MANIFOLD NEPTUNE II (INSTRUMENTS) ×2 IMPLANT
NEEDLE TAPERED W/ NITINOL LOOP (MISCELLANEOUS) ×2 IMPLANT
NS IRRIG 1000ML POUR BTL (IV SOLUTION) ×2 IMPLANT
PACK SHOULDER (CUSTOM PROCEDURE TRAY) ×2 IMPLANT
PAD ARMBOARD 7.5X6 YLW CONV (MISCELLANEOUS) ×2 IMPLANT
PAD COLD SHLDR WRAP-ON (PAD) ×2 IMPLANT
PIN NITINOL TARGETER 2.8 (PIN) IMPLANT
PIN SET MODULAR GLENOID SYSTEM (PIN) IMPLANT
RESTRAINT HEAD UNIVERSAL NS (MISCELLANEOUS) ×2 IMPLANT
SCREW CENTRAL MOD 30MM (Screw) ×2 IMPLANT
SCREW PERI LOCK 5.5X16 (Screw) ×4 IMPLANT
SCREW PERI LOCK 5.5X24 (Screw) ×2 IMPLANT
SCREW PERIPHERAL 5.5X28 LOCK (Screw) ×2 IMPLANT
SLING ARM FOAM STRAP LRG (SOFTGOODS) IMPLANT
SLING ARM FOAM STRAP MED (SOFTGOODS) IMPLANT
SPACER SHLD UNI REV 39 +6 (Shoulder) ×2 IMPLANT
SPONGE T-LAP 18X18 ~~LOC~~+RFID (SPONGE) ×4 IMPLANT
SPONGE T-LAP 4X18 ~~LOC~~+RFID (SPONGE) ×4 IMPLANT
STEM HUM UNIV REV SZ11 (Stem) ×2 IMPLANT
SUCTION FRAZIER HANDLE 12FR (TUBING) ×2
SUCTION TUBE FRAZIER 12FR DISP (TUBING) ×1 IMPLANT
SUT FIBERWIRE #2 38 T-5 BLUE (SUTURE) IMPLANT
SUT MNCRL AB 3-0 PS2 18 (SUTURE) ×2 IMPLANT
SUT MON AB 2-0 CT1 36 (SUTURE) ×2 IMPLANT
SUT VIC AB 1 CT1 36 (SUTURE) ×2 IMPLANT
SUTURE FIBERWR #2 38 T-5 BLUE (SUTURE) IMPLANT
SUTURE TAPE 1.3 40 TPR END (SUTURE) ×2 IMPLANT
SUTURETAPE 1.3 40 TPR END (SUTURE) ×4 IMPLANT
TOWEL OR 17X26 10 PK STRL BLUE (TOWEL DISPOSABLE) ×2 IMPLANT
TOWEL OR NON WOVEN STRL DISP B (DISPOSABLE) ×2 IMPLANT
WATER STERILE IRR 1000ML POUR (IV SOLUTION) ×4 IMPLANT
YANKAUER SUCT BULB TIP 10FT TU (MISCELLANEOUS) ×2 IMPLANT

## 2021-09-01 NOTE — Transfer of Care (Signed)
Immediate Anesthesia Transfer of Care Note  Patient: James [name].  Procedure(s) Performed: REVERSE SHOULDER ARTHROPLASTY (Right: Shoulder)  Patient Location: PACU  Anesthesia Type:GA combined with regional for post-op pain  Level of Consciousness: awake, drowsy and patient cooperative  Airway & Oxygen Therapy: Patient Spontanous Breathing and Patient connected to face mask oxygen  Post-op Assessment: Report given to RN and Post -op Vital signs reviewed and stable  Post vital signs: Reviewed and stable  Last Vitals:  Vitals Value Taken Time  BP    Temp 36.7 C 09/01/21 1207  Pulse 72 09/01/21 1210  Resp 19 09/01/21 1210  SpO2 100 % 09/01/21 1210  Vitals shown include unvalidated device data.  Last Pain:  Vitals:   09/01/21 0900  TempSrc:   PainSc: 0-No pain      Patients Stated Pain Goal: 4 (16/10/96 0454)  Complications: No notable events documented.

## 2021-09-01 NOTE — Op Note (Signed)
09/01/2021  12:02 PM  PATIENT:   James [name].  75 y.o. male  PRE-OPERATIVE DIAGNOSIS:  Right shoulder rotator cuff tear arthropathy  POST-OPERATIVE DIAGNOSIS: Same  PROCEDURE: Right shoulder reverse arthroplasty utilizing a press-fit size 11 Arthrex stem with a +6 spacer, +3 polyethylene insert over a neutral metaphysis, 39/+4 glenosphere on a small/+2 baseplate  SURGEON:  Javad Salva, Metta Clines M.D.  ASSISTANTS: Jenetta Loges, PA-C  ANESTHESIA:   General endotracheal and interscalene block with Exparel  EBL: 350 cc  SPECIMEN: None  Drains: None   PATIENT DISPOSITION:  PACU - hemodynamically stable.    PLAN OF CARE: Discharge to home after PACU  Brief history:  James [name] is a 75 year old gentleman who has had chronic difficulties with right shoulder pain with remote history of open rotator cuff repair.  He had a recent fall with acute injury to the shoulder resulting in a dislocation with subsequent reduction but following this he has had profound loss of shoulder motion and function.  Examination demonstrates global weakness with pain with x-rays and MRI scan confirming a massive chronic retracted rotator cuff tear with arthritis.  He is brought to the operating this time for planned right shoulder reverse arthroplasty.  Preoperatively, I counseled the patient regarding treatment options and risks versus benefits thereof.  Possible surgical complications were all reviewed including potential for bleeding, infection, neurovascular injury, persistent pain, loss of motion, anesthetic complication, failure of the implant, and possible need for additional surgery. They understand and accept and agrees with our planned procedure.   Procedure in detail:  After undergoing routine preop evaluation the patient received prophylactic antibiotics and interscalene block with Exparel was established in the holding area by the anesthesia department.  Patient was subsequently placed supine on  the operating table and underwent the smooth induction of a general endotracheal anesthesia.  Placed into the beachchair position and appropriately padded and protected.  The right shoulder girdle region was sterilely prepped and draped in standard fashion.  Timeout was called.  A deltopectoral approach right shoulder is made through an 8 cm incision.  Skin flaps were elevated dissection carried deeply and the deltopectoral interval was then developed from proximal to distal with the vein taken laterally.  We found complete disruption of the anterior rotator cuff and the humeral head was completely denuded and immediately was delivered through the wound.  We did divide capsular attachments from the anterior and infra margins of the humeral neck.  With head exposed we performed our humeral head resection after marking the appropriate starting point with an extra medullary guide.  This was performed with an oscillating saw at approximately 20 degrees retroversion.  A metal cap was then placed with the cut proximal humeral surface and we then exposed the glenoid with appropriate retractors.  A circumferential labral resection was then completed.  A guidepin was then directed into the center of the glenoid with an approximate 10 degree inferior tilt and the glenoid was then reamed with the central followed by the peripheral reamers.  Glenoid preparation completed with the central drill and tapped for a 30 mm lag screw.  The baseplate was assembled and then inserted after vancomycin powder had been applied with the threads of the lag screw and it was inserted with excellent purchase and fixation.  The peripheral locking screws were all then placed using standard technique.  Excellent purchase and fixation was achieved.  A 39/+4 glenosphere was then impacted onto the baseplate and the central locking screw was placed.  We then returned our attention to the proximal humerus where the metaphysis was opened.  We confirmed  that the retained metallic suture anchor was resected and the humeral head portion we then went ahead and opened up the humeral canal and then broached up to a size 11 and approximate 20 degrees retroversion.  The metaphysis was then reamed with a neutral reamer.  At this point a trial reduction was performed which showed good motion good stability good soft tissue balance.  At this point the trial was then removed our final implant was assembled the femoral canal was copiously irrigated cleaned and dried and vancomycin powder was then applied liberally and the implant was then seated at achieving excellent purchase and fixation.  Series of trial reduction was then performed and ultimately felt that the total of +9 off the implant gave [place] the best motion stability and soft tissue balance.  This point then we placed a +6 spacer onto the implant and then a +3 poly-.  Final reduction was then performed showing excellent motion good stability good soft tissue balance.  At this point copious irrigation was completed.  We did identify some persistent bleeding from the region of the axillary pouch and we spent considerable time carefully identified the bleeding source and achieved ultimately excellent hemostasis.  Final irrigation was completed.  Balance of vancomycin powder was then spread liberally throughout the deep soft tissue planes.  The deltopectoral interval was reapproximated with a series of figure-of-eight number Vicryl sutures.  2-0 Monocryl used to the subcu layer and intracuticular 3-0 Monocryl for the skin followed by Dermabond and Aquacel dressing.  Right arm was then placed into a sling and the patient was awakened, extubated, and taken to the recovery room in stable condition.  Jenetta Loges, PA-C was utilized as an Environmental consultant throughout this case, essential for help with positioning the patient, positioning extremity, tissue manipulation, implantation of the prosthesis, suture management, wound  closure, and intraoperative decision-making.  Marin Shutter MD   Contact # [phone]

## 2021-09-01 NOTE — Anesthesia Procedure Notes (Signed)
Anesthesia Regional Block: Interscalene brachial plexus block   Pre-Anesthetic Checklist: , timeout performed,  Correct Patient, Correct Site, Correct Laterality,  Correct Procedure, Correct Position, site marked,  Risks and benefits discussed,  Surgical consent,  Pre-op evaluation,  At surgeon's request and post-op pain management  Laterality: Right  Prep: chloraprep       Needles:  Injection technique: Single-shot  Needle Type: Echogenic Stimulator Needle     Needle Length: 9cm  Needle Gauge: 21     Additional Needles:   Procedures:,,,, ultrasound used (permanent image in chart),,    Narrative:  Start time: 09/01/2021 8:48 AM End time: 09/01/2021 8:53 AM Injection made incrementally with aspirations every 5 mL.  Performed by: Personally  Anesthesiologist: Effie Berkshire, MD  Additional Notes: Patient tolerated the procedure well. Local anesthetic introduced in an incremental fashion under minimal resistance after negative aspirations. No paresthesias were elicited. After completion of the procedure, no acute issues were identified and patient continued to be monitored by RN.

## 2021-09-01 NOTE — Anesthesia Postprocedure Evaluation (Signed)
Anesthesia Post Note  Patient: James [name].  Procedure(s) Performed: REVERSE SHOULDER ARTHROPLASTY (Right: Shoulder)     Patient location during evaluation: PACU Anesthesia Type: General Level of consciousness: awake and alert Pain management: pain level controlled Vital Signs Assessment: post-procedure vital signs reviewed and stable Respiratory status: spontaneous breathing, nonlabored ventilation, respiratory function stable and patient connected to nasal cannula oxygen Cardiovascular status: blood pressure returned to baseline and stable Postop Assessment: no apparent nausea or vomiting Anesthetic complications: no   No notable events documented.  Last Vitals:  Vitals:   09/01/21 1325 09/01/21 1434  BP: 118/90 130/75  Pulse: 73 67  Resp: 18 18  Temp:    SpO2: 95% 95%    Last Pain:  Vitals:   09/01/21 1434  TempSrc:   PainSc: 0-No pain                 Effie Berkshire

## 2021-09-01 NOTE — H&P (Signed)
James [name].    Chief Complaint: Right shoulder rotator cuff tear arthropathy HPI: The patient is a 75 y.o. male with chronic right shoulder pain related to a severe rotator cuff tear arthropathy.  He has a remote history of an open rotator cuff repair and then more recently had a fall where he dislocated the shoulder and subsequently has had profound loss of motion strength and function.  Due to his increasing difficulties and failure to respond to prolonged attempts at conservative management, he is brought to the operating room at this time for planned right shoulder reverse arthroplasty.  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)    History   Portal vein thrombosis    Rhinitis    Situational depression    Sleep apnea    uses CPAP every night   Tubular adenoma 12/10/2015   6 cecum polyps    Past Surgical History:  Procedure Laterality Date   CARDIAC CATHETERIZATION     greater 10 yrs ago, normal   CARPAL TUNNEL RELEASE     bilateral   COLONOSCOPY  06/2005   [facility] Lajoyce [place]   ESOPHAGOGASTRODUODENOSCOPY (EGD) WITH PROPOFOL N/A 09/28/2016   Procedure: ESOPHAGOGASTRODUODENOSCOPY (EGD) WITH PROPOFOL;  Surgeon: Gatha Mayer, MD;  Location: [facility];  Service: Endoscopy;  Laterality: N/A;   IR GENERIC HISTORICAL  12/12/2016   IR US GUIDE VASC ACCESS RIGHT 12/12/2016 [name], MD [facility]   IR GENERIC HISTORICAL  12/12/2016    IR VENOGRAM HEPATIC W HEMODYNAMIC EVALUATION 12/12/2016 [name], MD [facility]   IR GENERIC HISTORICAL  12/12/2016   IR TRANSCATHETER BX 12/12/2016 [name], MD [facility]   ROTATOR CUFF REPAIR     right   TENDON REPAIR     left arm   TONSILLECTOMY     TOTAL KNEE ARTHROPLASTY Right 2010   WISDOM TOOTH EXTRACTION      Family History  Problem Relation Age of Onset   Bipolar disorder Son        committed suicide   Colon cancer Other    Heart failure Other    Heart attack Other    Lung disease Father        history   Diabetes Mother        history   Stroke Mother        history   Colon polyps Neg Hx    Rectal cancer Neg Hx    Stomach cancer Neg Hx    Esophageal cancer Neg Hx     Social History:  reports that he quit smoking about 47 years ago. His smoking use included pipe. He has never used smokeless tobacco. He reports current alcohol use. He reports that he does not use drugs.   Medications Prior to Admission  Medication Sig Dispense Refill  buPROPion (WELLBUTRIN XL) 300 MG 24 hr tablet Take 300 mg by mouth every morning.     dasatinib (SPRYCEL) 70 MG tablet TAKE 1 TABLET (70 MG TOTAL) BY MOUTH DAILY. 30 tablet 4   desmopressin (DDAVP) 0.2 MG tablet Take 0.4 mg by mouth at bedtime.     fluticasone (FLONASE) 50 MCG/ACT nasal spray Place 1 spray into both nostrils daily as needed for allergies.     glucose blood test strip OneTouch Ultra Blue Test Strip  USE TO TEST BLOOD SUGAR ONCE DAILY     Lidocaine 4 % PTCH Apply 1 patch topically daily as needed (pain).     LORazepam (ATIVAN) 0.5 MG tablet Take 1 mg by mouth at bedtime.  3   metFORMIN (GLUCOPHAGE-XR) 500 MG 24 hr tablet Take 500 mg by mouth 2 (two) times daily.     polyethylene glycol (MIRALAX / GLYCOLAX) 17 g packet Take 17 g by mouth daily as needed for moderate constipation.     PRESCRIPTION MEDICATION every 8 (eight) weeks. Gets injections in right eye at dr's office every 8 weeks      rosuvastatin  (CRESTOR) 20 MG tablet Take 20 mg by mouth at bedtime.     sildenafil (VIAGRA) 50 MG tablet Take 50 mg by mouth daily as needed for erectile dysfunction.  1   testosterone cypionate (DEPOTESTOSTERONE CYPIONATE) 200 MG/ML injection Inject 200 mg into the muscle every 21 ( twenty-one) days.     TRULICITY 1.5 GE/3.6OQ SOPN Inject 1.5 mg into the muscle every Monday.     vitamin B-12 (CYANOCOBALAMIN) 1000 MCG tablet Take 1,000 mcg by mouth daily.     XARELTO 10 MG TABS tablet TAKE 1 TABLET BY MOUTH EVERY DAY 30 tablet 11   zolpidem (AMBIEN) 10 MG tablet Take 10 mg by mouth at bedtime.     cephALEXin (KEFLEX) 500 MG capsule Take 2,000 mg by mouth See admin instructions. Prior to dental procedure     moxifloxacin (VIGAMOX) 0.5 % ophthalmic solution PLACE 1 DROP IN RIGHT EYE FOUR TIMES A DAY USE THE DAY BEFORE, DAY OF, AND DAY AFTER TREATMENT.  5     Physical Exam: Right shoulder demonstrates a well-healed anterolateral incision from his previous rotator cuff repair.  Examination otherwise as noted at his recent office visits which includes profoundly restricted motion as well as pain and weakness.  Plain radiographs confirm a retained suture anchor within the humeral head and narrowing of the acromiohumeral interval and changes consistent with rotator cuff tear arthropathy.  Vitals  Temp:  [98.3 F (36.8 C)] 98.3 F (36.8 C) (09/15 0818) Pulse Rate:  [72-75] 75 (09/15 0900) Resp:  [18-20] 19 (09/15 0900) BP: (163-174)/(79-105) 168/85 (09/15 0900) SpO2:  [98 %-100 %] 100 % (09/15 0900) Weight:  [76.2 kg] 76.2 kg (09/15 0809)  Assessment/Plan  Impression: Right shoulder rotator cuff tear arthropathy  Plan of Action: Procedure(s): REVERSE SHOULDER ARTHROPLASTY  James [name] 09/01/2021, 9:11 AM Contact # [phone]

## 2021-09-01 NOTE — Evaluation (Signed)
Occupational Therapy Evaluation Patient Details Name: James [name]. MRN: [id number] DOB: 12-Apr-1946 Today's Date: 09/01/2021   History of Present Illness patient s/p rTSA   Clinical Impression   Mr. Danny Celis is a 75 year old man s/p shoulder replacement without functional use of right dominant upper extremity secondary to effects of surgery and interscalene block and shoulder precautions. Therapist provided education and instruction to patient and spouse in regards to exercises, precautions, positioning, donning upper extremity clothing and bathing while maintaining shoulder precautions, ice and edema management and donning/doffing sling. Patient and spouse verbalized understanding and demonstrated as needed. Patient needed assistance to donn shirt, underwear, pants, socks and shoes and spouse provided assistance. Patient to follow up with MD for further therapy needs.        Recommendations for follow up therapy are one component of a multi-disciplinary discharge planning process, led by the attending physician.  Recommendations may be updated based on patient status, additional functional criteria and insurance authorization.   Follow Up Recommendations  Follow surgeon's recommendation for DC plan and follow-up therapies    Equipment Recommendations  None recommended by OT    Recommendations for Other Services       Precautions / Restrictions Precautions Precautions: Shoulder;Fall Type of Shoulder Precautions: If sitting in controlled environment, ok to come out of sling to give neck a break. Please sleep in it to protect until follow up in office.     OK to use operative arm for feeding, hygiene and ADLs.   Ok to instruct Pendulums and lap slides as exercises. Ok to use operative arm within the following parameters for ADL purposes     New ROM (4/40)   Ok for PROM, AAROM, AROM within pain tolerance and within the following ROM   ER 20   ABD 45   FE 60 Shoulder Interventions:  Shoulder sling/immobilizer;Off for dressing/bathing/exercises Precaution Booklet Issued:  (handouts) Required Braces or Orthoses: Sling Restrictions Weight Bearing Restrictions: Yes RUE Weight Bearing: Non weight bearing      Mobility Bed Mobility               General bed mobility comments: up in chair    Transfers                 General transfer comment: Patietn uses a rolling walker at baseline. Has a cane he can use at discharge. Able to ambulate in bay area with short shuffling steps without loss of ba lance but therapist recommended wife be near patient at all times and to use quad cane.    Balance Overall balance assessment: Mild deficits observed, not formally tested                                         ADL either performed or assessed with clinical judgement   ADL Overall ADL's : Needs assistance/impaired Eating/Feeding: Set up   Grooming: Set up   Upper Body Bathing: Minimal assistance;Adhering to UE precautions;Sitting   Lower Body Bathing: Minimal assistance;Sit to/from stand   Upper Body Dressing : Maximal assistance;Sitting;Adhering to UE precautions   Lower Body Dressing: Maximal assistance;Sit to/from stand       Toileting- Architect and Hygiene: Min guard;Minimal assistance       Functional mobility during ADLs: Min guard;Minimal assistance       Vision Patient Visual Report: No change from baseline  Perception     Praxis      Pertinent Vitals/Pain Pain Assessment: No/denies pain     Hand Dominance Right   Extremity/Trunk Assessment Upper Extremity Assessment Upper Extremity Assessment: RUE deficits/detail RUE Deficits / Details: Minimal active ROM in fingers at this time secondary to interscalene block RUE Sensation: decreased light touch   Lower Extremity Assessment Lower Extremity Assessment: Overall WFL for tasks assessed   Cervical / Trunk Assessment Cervical / Trunk  Assessment: Kyphotic   Communication Communication Communication: HOH   Cognition Arousal/Alertness: Awake/alert Behavior During Therapy: WFL for tasks assessed/performed Overall Cognitive Status: Within Functional Limits for tasks assessed                                     General Comments       Exercises     Shoulder Instructions Shoulder Instructions Donning/doffing shirt without moving shoulder: Caregiver independent with task Method for sponge bathing under operated UE: Caregiver independent with task Donning/doffing sling/immobilizer: Caregiver independent with task Correct positioning of sling/immobilizer: Caregiver independent with task Pendulum exercises (written home exercise program): Caregiver independent with task (seated/modified pendulums) ROM for elbow, wrist and digits of operated UE: Caregiver independent with task Sling wearing schedule (on at all times/off for ADL's): Caregiver independent with task Proper positioning of operated UE when showering: Caregiver independent with task Dressing change: Caregiver independent with task    Home Living Family/patient expects to be discharged to:: Private residence Living Arrangements: Spouse/significant other Available Help at Discharge: Family;Available 24 hours/day Type of Home: House             Bathroom Shower/Tub: Walk-in shower         Home Equipment: Shower seat          Prior Functioning/Environment Level of Independence: Independent with assistive device(s)                 OT Problem List: Decreased strength;Decreased range of motion;Impaired UE functional use;Pain      OT Treatment/Interventions:      OT Goals(Current goals can be found in the care plan section) Acute Rehab OT Goals OT Goal Formulation: All assessment and education complete, DC therapy  OT Frequency:     Barriers to D/C:            Co-evaluation              AM-PAC OT "6 Clicks" Daily  Activity     Outcome Measure Help from another person eating meals?: A Little Help from another person taking care of personal grooming?: A Little Help from another person toileting, which includes using toliet, bedpan, or urinal?: A Little Help from another person bathing (including washing, rinsing, drying)?: A Little Help from another person to put on and taking off regular upper body clothing?: A Little Help from another person to put on and taking off regular lower body clothing?: A Lot 6 Click Score: 17   End of Session Nurse Communication: Other (comment) (Ot education complete)  Activity Tolerance: Patient tolerated treatment well Patient left: in chair;with call bell/phone within reach  OT Visit Diagnosis: Muscle weakness (generalized) (M62.81)                Time: 4098-1191 OT Time Calculation (min): 24 min Charges:  OT Evaluation $OT Eval Low Complexity: 1 Low OT Treatments $Self Care/Home Management : 8-22 mins  Cosimo Schertzer, OTR/L Acute Care Rehab Services  Office [phone] Pager: [phone]   Kelli Churn 09/01/2021, 2:39 PM

## 2021-09-01 NOTE — Progress Notes (Signed)
Assisted Dr. Smith Robert with right, ultrasound guided, interscalene  block. Side rails up, monitors on throughout procedure. See vital signs in flow sheet. Tolerated Procedure well.

## 2021-09-01 NOTE — Anesthesia Preprocedure Evaluation (Addendum)
Anesthesia Evaluation  Patient identified by MRN, date of birth, ID band Patient awake    Reviewed: Allergy & Precautions, NPO status , Patient's Chart, lab work & pertinent test results  Airway Mallampati: II  TM Distance: >3 FB Neck ROM: Full    Dental  (+) Teeth Intact, Dental Advisory Given   Pulmonary sleep apnea and Continuous Positive Airway Pressure Ventilation , former smoker,    breath sounds clear to auscultation       Cardiovascular hypertension,  Rhythm:Regular Rate:Normal     Neuro/Psych PSYCHIATRIC DISORDERS Anxiety Depression    GI/Hepatic hiatal hernia, GERD  ,(+) Hepatitis -  Endo/Other  diabetes, Type 2, Oral Hypoglycemic Agents  Renal/GU      Musculoskeletal  (+) Arthritis ,   Abdominal Normal abdominal exam  (+)   Peds  Hematology   Anesthesia Other Findings   Reproductive/Obstetrics                            Anesthesia Physical Anesthesia Plan  ASA: 3  Anesthesia Plan: General   Post-op Pain Management: GA combined w/ Regional for post-op pain   Induction:   PONV Risk Score and Plan: 3 and Ondansetron, Dexamethasone and Treatment may vary due to age or medical condition  Airway Management Planned: Oral ETT  Additional Equipment: None  Intra-op Plan:   Post-operative Plan: Extubation in OR  Informed Consent: I have reviewed the patients History and Physical, chart, labs and discussed the procedure including the risks, benefits and alternatives for the proposed anesthesia with the patient or authorized representative who has indicated his/her understanding and acceptance.     Dental advisory given  Plan Discussed with: CRNA  Anesthesia Plan Comments: (Lab Results      Component                Value               Date                      WBC                      5.8                 08/24/2021                HGB                      15.7                 08/24/2021                HCT                      48.8                08/24/2021                MCV                      94.9                08/24/2021                PLT  222                 08/24/2021           )       Anesthesia Quick Evaluation

## 2021-09-01 NOTE — Discharge Instructions (Signed)
Metta Clines. Supple, M.D., F.A.A.O.S. Orthopaedic Surgery Specializing in Arthroscopic and Reconstructive Surgery of the Shoulder [phone] [address] [place], [place] [address] - Fax [phone]   POST-OP TOTAL SHOULDER REPLACEMENT INSTRUCTIONS  1. Follow up in the office for your first post-op appointment 10-14 days from the date of your surgery. If you do not already have a scheduled appointment, our office will contact you to schedule.  2. The bandage over your incision is waterproof. You may begin showering with this dressing on. You may leave this dressing on until first follow up appointment within 2 weeks. We prefer you leave this dressing in place until follow up however after 5-7 days if you are having itching or skin irritation and would like to remove it you may do so. Go slow and tug at the borders gently to break the bond the dressing has with the skin. At this point if there is no drainage it is okay to go without a bandage or you may cover it with a light guaze and tape. You can also expect significant bruising around your shoulder that will drift down your arm and into your chest wall. This is very normal and should resolve over several days.   3. Wear your sling/immobilizer at all times except to perform the exercises below or to occasionally let your arm dangle by your side to stretch your elbow. You also need to sleep in your sling immobilizer until instructed otherwise. It is ok to remove your sling if you are sitting in a controlled environment and allow your arm to rest in a position of comfort by your side or on your lap with pillows to give your neck and skin a break from the sling. You may remove it to allow arm to dangle by side to shower. If you are up walking around and when you go to sleep at night you need to wear it.  4. Range of motion to your elbow, wrist, and hand are encouraged 3-5 times daily. Exercise to your hand and fingers helps to reduce  swelling you may experience.   5. Prescriptions for a pain medication and a muscle relaxant are provided for you. It is recommended that if you are experiencing pain that you pain medication alone is not controlling, add the muscle relaxant along with the pain medication which can give additional pain relief. The first 1-2 days is generally the most severe of your pain and then should gradually decrease. As your pain lessens it is recommended that you decrease your use of the pain medications to an "as needed basis'" only and to always comply with the recommended dosages of the pain medications.  6. Pain medications can produce constipation along with their use. If you experience this, the use of an over the counter stool softener or laxative daily is recommended.   7. For additional questions or concerns, please do not hesitate to call the office. If after hours there is an answering service to forward your concerns to the physician on call.  8.Pain control following an exparel block  To help control your post-operative pain you received a nerve block  performed with Exparel which is a long acting anesthetic (numbing agent) which can provide pain relief and sensations of numbness (and relief of pain) in the operative shoulder and arm for up to 3 days. Sometimes it provides mixed relief, meaning you may still have numbness in certain areas of the arm but can still be able to  move  parts of that arm, hand, and fingers. We recommend that your prescribed pain medications  be used as needed. We do not feel it is necessary to "pre medicate" and "stay ahead" of pain.  Taking narcotic pain medications when you are not having any pain can lead to unnecessary and potentially dangerous side effects.    9. Use the ice machine as much as possible in the first 5-7 days from surgery, then you can wean its use to as needed. The ice typically needs to be replaced every 6 hours, instead of ice you can actually freeze  water bottles to put in the cooler and then fill water around them to avoid having to purchase ice. You can have spare water bottles freezing to allow you to rotate them once they have melted. Try to have a thin shirt or light cloth or towel under the ice wrap to protect your skin.   FOR ADDITIONAL INFO ON ICE MACHINE AND INSTRUCTIONS GO TO THE WEBSITE AT  [email or web address]  10.  We recommend that you avoid any dental work or cleaning in the first 3 months following your joint replacement. This is to help minimize the possibility of infection from the bacteria in your mouth that enters your bloodstream during dental work. We also recommend that you take an antibiotic prior to your dental work for the first year after your shoulder replacement to further help reduce that risk. Please simply contact our office for antibiotics to be sent to your pharmacy prior to dental work.  11. Dental Antibiotics:  In most cases prophylactic antibiotics for Dental procdeures after total joint surgery are not necessary.  Exceptions are as follows:  1. History of prior total joint infection  2. Severely immunocompromised (Organ Transplant, cancer chemotherapy, Rheumatoid biologic meds such as Herbst)  3. Poorly controlled diabetes (A1C &gt; 8.0, blood glucose over 200)  If you have one of these conditions, contact your surgeon for an antibiotic prescription, prior to your dental procedure.   POST-OP EXERCISES  Pendulum Exercises  Perform pendulum exercises while standing and bending at the waist. Support your uninvolved arm on a table or chair and allow your operated arm to hang freely. Make sure to do these exercises passively - not using you shoulder muscles. These exercises can be performed once your nerve block effects have worn off.  Repeat 20 times. Do 3 sessions per day.

## 2021-09-01 NOTE — Anesthesia Procedure Notes (Signed)
Procedure Name: Intubation Date/Time: 09/01/2021 10:08 AM Performed by: West Pugh, CRNA Pre-anesthesia Checklist: Patient identified, Emergency Drugs available, Suction available, Patient being monitored and Timeout performed Patient Re-evaluated:Patient Re-evaluated prior to induction Oxygen Delivery Method: Circle system utilized Preoxygenation: Pre-oxygenation with 100% oxygen Induction Type: IV induction Ventilation: Mask ventilation without difficulty Laryngoscope Size: 2 and Miller Grade View: Grade I Tube type: Oral Tube size: 7.5 mm Number of attempts: 1 Airway Equipment and Method: Stylet Placement Confirmation: ETT inserted through vocal cords under direct vision, positive ETCO2, CO2 detector and breath sounds checked- equal and bilateral Secured at: 22 cm Tube secured with: Tape Dental Injury: Teeth and Oropharynx as per pre-operative assessment  Comments: James Ee RN,SRNA provided the intubation x 1 attempt, AOI, CRNA and Anesthesiologist present throughout procedure.

## 2021-09-02 ENCOUNTER — Telehealth: Payer: Self-pay | Admitting: *Deleted

## 2021-09-02 ENCOUNTER — Emergency Department (HOSPITAL_COMMUNITY): Payer: Medicare Other

## 2021-09-02 ENCOUNTER — Other Ambulatory Visit: Payer: Self-pay

## 2021-09-02 ENCOUNTER — Other Ambulatory Visit: Payer: Medicare Other

## 2021-09-02 ENCOUNTER — Ambulatory Visit: Payer: Medicare Other | Admitting: Hematology & Oncology

## 2021-09-02 ENCOUNTER — Inpatient Hospital Stay (HOSPITAL_COMMUNITY)
Admission: EM | Admit: 2021-09-02 | Discharge: 2021-09-04 | DRG: 981 | Disposition: A | Discharge status: Home / Self Care | Payer: Medicare Other | Attending: Internal Medicine | Admitting: Internal Medicine

## 2021-09-02 ENCOUNTER — Encounter (HOSPITAL_COMMUNITY): Payer: Self-pay

## 2021-09-02 DIAGNOSIS — T40605A Adverse effect of unspecified narcotics, initial encounter: Secondary | ICD-10-CM | POA: Diagnosis present

## 2021-09-02 DIAGNOSIS — Y92009 Unspecified place in unspecified non-institutional (private) residence as the place of occurrence of the external cause: Secondary | ICD-10-CM

## 2021-09-02 DIAGNOSIS — J690 Pneumonitis due to inhalation of food and vomit: Principal | ICD-10-CM | POA: Diagnosis present

## 2021-09-02 DIAGNOSIS — M19011 Primary osteoarthritis, right shoulder: Secondary | ICD-10-CM | POA: Diagnosis present

## 2021-09-02 DIAGNOSIS — R300 Dysuria: Secondary | ICD-10-CM | POA: Diagnosis present

## 2021-09-02 DIAGNOSIS — F05 Delirium due to known physiological condition: Principal | ICD-10-CM

## 2021-09-02 DIAGNOSIS — Z8619 Personal history of other infectious and parasitic diseases: Secondary | ICD-10-CM

## 2021-09-02 DIAGNOSIS — Z86010 Personal history of colonic polyps: Secondary | ICD-10-CM

## 2021-09-02 DIAGNOSIS — G4733 Obstructive sleep apnea (adult) (pediatric): Secondary | ICD-10-CM

## 2021-09-02 DIAGNOSIS — Z86718 Personal history of other venous thrombosis and embolism: Secondary | ICD-10-CM

## 2021-09-02 DIAGNOSIS — K7581 Nonalcoholic steatohepatitis (NASH): Secondary | ICD-10-CM | POA: Diagnosis present

## 2021-09-02 DIAGNOSIS — E785 Hyperlipidemia, unspecified: Secondary | ICD-10-CM | POA: Diagnosis present

## 2021-09-02 DIAGNOSIS — H3530 Unspecified macular degeneration: Secondary | ICD-10-CM | POA: Diagnosis present

## 2021-09-02 DIAGNOSIS — Z87891 Personal history of nicotine dependence: Secondary | ICD-10-CM

## 2021-09-02 DIAGNOSIS — I10 Essential (primary) hypertension: Secondary | ICD-10-CM | POA: Diagnosis present

## 2021-09-02 DIAGNOSIS — E1165 Type 2 diabetes mellitus with hyperglycemia: Secondary | ICD-10-CM | POA: Diagnosis present

## 2021-09-02 DIAGNOSIS — Z79899 Other long term (current) drug therapy: Secondary | ICD-10-CM

## 2021-09-02 DIAGNOSIS — D45 Polycythemia vera: Secondary | ICD-10-CM | POA: Diagnosis present

## 2021-09-02 DIAGNOSIS — Z7984 Long term (current) use of oral hypoglycemic drugs: Secondary | ICD-10-CM

## 2021-09-02 DIAGNOSIS — K219 Gastro-esophageal reflux disease without esophagitis: Secondary | ICD-10-CM | POA: Diagnosis present

## 2021-09-02 DIAGNOSIS — Z8249 Family history of ischemic heart disease and other diseases of the circulatory system: Secondary | ICD-10-CM

## 2021-09-02 DIAGNOSIS — Z9989 Dependence on other enabling machines and devices: Secondary | ICD-10-CM

## 2021-09-02 DIAGNOSIS — I81 Portal vein thrombosis: Secondary | ICD-10-CM | POA: Diagnosis present

## 2021-09-02 DIAGNOSIS — Z823 Family history of stroke: Secondary | ICD-10-CM

## 2021-09-02 DIAGNOSIS — Z856 Personal history of leukemia: Secondary | ICD-10-CM

## 2021-09-02 DIAGNOSIS — M75101 Unspecified rotator cuff tear or rupture of right shoulder, not specified as traumatic: Secondary | ICD-10-CM | POA: Diagnosis present

## 2021-09-02 DIAGNOSIS — Z7901 Long term (current) use of anticoagulants: Secondary | ICD-10-CM

## 2021-09-02 DIAGNOSIS — G9341 Metabolic encephalopathy: Secondary | ICD-10-CM | POA: Diagnosis present

## 2021-09-02 DIAGNOSIS — C9210 Chronic myeloid leukemia, BCR/ABL-positive, not having achieved remission: Secondary | ICD-10-CM

## 2021-09-02 DIAGNOSIS — Z96651 Presence of right artificial knee joint: Secondary | ICD-10-CM | POA: Diagnosis present

## 2021-09-02 DIAGNOSIS — G8929 Other chronic pain: Secondary | ICD-10-CM | POA: Diagnosis present

## 2021-09-02 DIAGNOSIS — Z20822 Contact with and (suspected) exposure to covid-19: Secondary | ICD-10-CM | POA: Diagnosis present

## 2021-09-02 DIAGNOSIS — G9340 Encephalopathy, unspecified: Secondary | ICD-10-CM | POA: Diagnosis not present

## 2021-09-02 DIAGNOSIS — F419 Anxiety disorder, unspecified: Secondary | ICD-10-CM | POA: Diagnosis present

## 2021-09-02 DIAGNOSIS — Z833 Family history of diabetes mellitus: Secondary | ICD-10-CM

## 2021-09-02 DIAGNOSIS — Z800 Family history of malignant neoplasm of digestive organs: Secondary | ICD-10-CM

## 2021-09-02 DIAGNOSIS — F32A Depression, unspecified: Secondary | ICD-10-CM | POA: Diagnosis present

## 2021-09-02 DIAGNOSIS — K7460 Unspecified cirrhosis of liver: Secondary | ICD-10-CM | POA: Diagnosis present

## 2021-09-02 DIAGNOSIS — R6510 Systemic inflammatory response syndrome (SIRS) of non-infectious origin without acute organ dysfunction: Secondary | ICD-10-CM | POA: Diagnosis present

## 2021-09-02 LAB — URINALYSIS, ROUTINE W REFLEX MICROSCOPIC
Bilirubin Urine: NEGATIVE
Glucose, UA: 100 mg/dL — AB
Hgb urine dipstick: NEGATIVE
Ketones, ur: NEGATIVE mg/dL
Leukocytes,Ua: NEGATIVE
Nitrite: NEGATIVE
Protein, ur: NEGATIVE mg/dL
Specific Gravity, Urine: 1.02 (ref 1.005–1.030)
pH: 7 (ref 5.0–8.0)

## 2021-09-02 LAB — CBC WITH DIFFERENTIAL/PLATELET
Abs Immature Granulocytes: 0.04 10*3/uL (ref 0.00–0.07)
Basophils Absolute: 0 10*3/uL (ref 0.0–0.1)
Basophils Relative: 0 %
Eosinophils Absolute: 0 10*3/uL (ref 0.0–0.5)
Eosinophils Relative: 0 %
HCT: 44.1 % (ref 39.0–52.0)
Hemoglobin: 14.4 g/dL (ref 13.0–17.0)
Immature Granulocytes: 0 %
Lymphocytes Relative: 6 %
Lymphs Abs: 0.6 10*3/uL — ABNORMAL LOW (ref 0.7–4.0)
MCH: 30.6 pg (ref 26.0–34.0)
MCHC: 32.7 g/dL (ref 30.0–36.0)
MCV: 93.8 fL (ref 80.0–100.0)
Monocytes Absolute: 2 10*3/uL — ABNORMAL HIGH (ref 0.1–1.0)
Monocytes Relative: 21 %
Neutro Abs: 6.7 10*3/uL (ref 1.7–7.7)
Neutrophils Relative %: 73 %
Platelets: 204 10*3/uL (ref 150–400)
RBC: 4.7 MIL/uL (ref 4.22–5.81)
RDW: 17 % — ABNORMAL HIGH (ref 11.5–15.5)
WBC: 9.3 10*3/uL (ref 4.0–10.5)
nRBC: 0 % (ref 0.0–0.2)

## 2021-09-02 LAB — COMPREHENSIVE METABOLIC PANEL
ALT: 17 U/L (ref 0–44)
AST: 23 U/L (ref 15–41)
Albumin: 3.8 g/dL (ref 3.5–5.0)
Alkaline Phosphatase: 77 U/L (ref 38–126)
Anion gap: 8 (ref 5–15)
BUN: 15 mg/dL (ref 8–23)
CO2: 25 mmol/L (ref 22–32)
Calcium: 10 mg/dL (ref 8.9–10.3)
Chloride: 101 mmol/L (ref 98–111)
Creatinine, Ser: 1.04 mg/dL (ref 0.61–1.24)
GFR, Estimated: >60 mL/min (ref >60)
Glucose, Bld: 166 mg/dL — ABNORMAL HIGH (ref 70–99)
Potassium: 4.2 mmol/L (ref 3.5–5.1)
Sodium: 134 mmol/L — ABNORMAL LOW (ref 135–145)
Total Bilirubin: 1 mg/dL (ref 0.3–1.2)
Total Protein: 6.8 g/dL (ref 6.5–8.1)

## 2021-09-02 LAB — RAPID URINE DRUG SCREEN, HOSP PERFORMED
Amphetamines: NOT DETECTED
Barbiturates: NOT DETECTED
Benzodiazepines: NOT DETECTED
Cocaine: NOT DETECTED
Opiates: NOT DETECTED
Tetrahydrocannabinol: NOT DETECTED

## 2021-09-02 LAB — RESP PANEL BY RT-PCR (FLU A&B, COVID) ARPGX2
Influenza A by PCR: NEGATIVE
Influenza B by PCR: NEGATIVE
SARS Coronavirus 2 by RT PCR: NEGATIVE

## 2021-09-02 LAB — PROTIME-INR
INR: 1.6 — ABNORMAL HIGH (ref 0.8–1.2)
Prothrombin Time: 19.1 seconds — ABNORMAL HIGH (ref 11.4–15.2)

## 2021-09-02 LAB — APTT: aPTT: 32 seconds (ref 24–36)

## 2021-09-02 LAB — LACTIC ACID, PLASMA: Lactic Acid, Venous: 1.5 mmol/L (ref 0.5–1.9)

## 2021-09-02 LAB — GLUCOSE, CAPILLARY: Glucose-Capillary: 145 mg/dL — ABNORMAL HIGH (ref 70–99)

## 2021-09-02 LAB — VITAMIN B12: Vitamin B-12: 949 pg/mL — ABNORMAL HIGH (ref 180–914)

## 2021-09-02 LAB — AMMONIA: Ammonia: 25 umol/L (ref 9–35)

## 2021-09-02 LAB — CBG MONITORING, ED: Glucose-Capillary: 100 mg/dL — ABNORMAL HIGH (ref 70–99)

## 2021-09-02 MED ORDER — DASATINIB 70 MG PO TABS
70.0000 mg | ORAL_TABLET | Freq: Every day | ORAL | Status: DC
Start: 2021-09-02 — End: 2021-09-02

## 2021-09-02 MED ORDER — ACETAMINOPHEN 325 MG PO TABS
650.0000 mg | ORAL_TABLET | Freq: Four times a day (QID) | ORAL | Status: DC | PRN
  Administered 2021-09-02 – 2021-09-04 (×5): 650 mg via ORAL
  Filled 2021-09-02 (×5): qty 2

## 2021-09-02 MED ORDER — LACTATED RINGERS IV BOLUS (SEPSIS)
1000.0000 mL | Freq: Once | INTRAVENOUS | Status: AC
  Administered 2021-09-02: 1000 mL via INTRAVENOUS

## 2021-09-02 MED ORDER — AMLODIPINE BESYLATE 5 MG PO TABS
5.0000 mg | ORAL_TABLET | Freq: Two times a day (BID) | ORAL | Status: DC
  Administered 2021-09-02 – 2021-09-04 (×5): 5 mg via ORAL
  Filled 2021-09-02 (×5): qty 1

## 2021-09-02 MED ORDER — LORAZEPAM 0.5 MG PO TABS
0.5000 mg | ORAL_TABLET | Freq: Every evening | ORAL | Status: DC | PRN
  Administered 2021-09-02 – 2021-09-03 (×2): 0.5 mg via ORAL
  Filled 2021-09-02 (×2): qty 1

## 2021-09-02 MED ORDER — ACETAMINOPHEN 325 MG PO TABS
650.0000 mg | ORAL_TABLET | Freq: Once | ORAL | Status: AC
  Administered 2021-09-02: 650 mg via ORAL
  Filled 2021-09-02: qty 2

## 2021-09-02 MED ORDER — TRAMADOL HCL 50 MG PO TABS
25.0000 mg | ORAL_TABLET | Freq: Three times a day (TID) | ORAL | Status: DC | PRN
  Administered 2021-09-02 – 2021-09-03 (×4): 25 mg via ORAL
  Filled 2021-09-02 (×4): qty 1

## 2021-09-02 MED ORDER — SODIUM CHLORIDE 0.9 % IV SOLN
3.0000 g | Freq: Four times a day (QID) | INTRAVENOUS | Status: DC
  Administered 2021-09-02 – 2021-09-04 (×8): 3 g via INTRAVENOUS
  Filled 2021-09-02: qty 8
  Filled 2021-09-02: qty 3
  Filled 2021-09-02 (×2): qty 8
  Filled 2021-09-02 (×2): qty 3
  Filled 2021-09-02: qty 8
  Filled 2021-09-02: qty 3
  Filled 2021-09-02: qty 8
  Filled 2021-09-02: qty 3

## 2021-09-02 MED ORDER — ACETAMINOPHEN 650 MG RE SUPP: 650.0000 mg | Freq: Four times a day (QID) | RECTAL | Status: DC | PRN

## 2021-09-02 MED ORDER — RIVAROXABAN 10 MG PO TABS
10.0000 mg | ORAL_TABLET | Freq: Every day | ORAL | Status: DC
  Administered 2021-09-02 – 2021-09-03 (×2): 10 mg via ORAL
  Filled 2021-09-02 (×2): qty 1

## 2021-09-02 MED ORDER — INSULIN ASPART 100 UNIT/ML IJ SOLN
0.0000 [IU] | Freq: Three times a day (TID) | INTRAMUSCULAR | Status: DC
Start: 2021-09-02 — End: 2021-09-04
  Administered 2021-09-03 – 2021-09-04 (×2): 1 [IU] via SUBCUTANEOUS
  Filled 2021-09-02: qty 0.06

## 2021-09-02 MED ORDER — ONDANSETRON HCL 4 MG/2ML IJ SOLN: 4.0000 mg | Freq: Four times a day (QID) | INTRAMUSCULAR | Status: DC | PRN

## 2021-09-02 MED ORDER — ONDANSETRON HCL 4 MG PO TABS: 4.0000 mg | ORAL_TABLET | Freq: Four times a day (QID) | ORAL | Status: DC | PRN

## 2021-09-02 NOTE — ED Notes (Signed)
Patient transported to CT 

## 2021-09-02 NOTE — Progress Notes (Signed)
[name].  MRN: [id number] DOB/Age: 07-21-1946 75 y.o. [facility] Procedure: Procedure(s) (LRB): REVERSE SHOULDER ARTHROPLASTY (Right)     Subjective: Pt was evaluated by Dr. Onnie Graham at bedside following readmission for post op confusion last night following yesterdays right shoulder reverse arthroplasty    Vital Signs Temp:  [98.9 F (37.2 C)-101 F (38.3 C)] 99.1 F (37.3 C) (09/16 0423) Pulse Rate:  [52-171] 82 (09/16 1619) Resp:  [7-25] 16 (09/16 1619) BP: (115-161)/(55-112) 127/112 (09/16 1619) SpO2:  [80 %-98 %] 94 % (09/16 1619) Weight:  [76.2 kg] 76.2 kg (09/16 0254)  Lab Results Recent Labs    09/02/21 0310  WBC 9.3  HGB 14.4  HCT 44.1  PLT 204   BMET Recent Labs    09/02/21 0310  NA 134*  K 4.2  CL 101  CO2 25  GLUCOSE 166*  BUN 15  CREATININE 1.04  CALCIUM 10.0   INR  Date Value Ref Range Status  09/02/2021 1.6 (H) 0.8 - 1.2 Final    Comment:    (NOTE) INR goal varies based on device and disease states. Performed at [facility], [place] [address] [place], [place] [address]      Exam His surgical bandage was clean and dry and arm with no obvious redness or concerns        Plan Patient being admitted with post op delirium and elevated temps concerning for aspiration. Work up thus far negative and being admitted to medicine and agree with discontinuation of opiate pain meds. IV unasyn initiated. Continue current care and appreciate medicines help with Mr. Mcbroom. Please do not hesitate to call for any needs through the weekend.  Olivia Mackie Tynia Wiers PA-C  09/02/2021, 4:42 PM Contact # [phone]

## 2021-09-02 NOTE — ED Triage Notes (Signed)
Patient BIB [facility] from home after having shoulder surgery at 1pm yesterday afternoon, sent home with percocet and antibiotics. Patient is diabetic CBG 199. Patient is disoriented to time, does not know the day or year. Patient started acting "off" per wife and began getting aggressive towards her then when she got up this morning, wife noticed he was acting even stranger so she called EMS.    EMS vitals RR 20 BP 120/70 HR 80

## 2021-09-02 NOTE — Progress Notes (Signed)
Pharmacy Antibiotic Note  James [name]. is a 75 y.o. male admitted on 09/02/2021 with Aspiration PNA. Pharmacy has been consulted for Unasyn dosing.  Plan: Unasyn 3 gr IV q6h  Monitor clinical course, renal function, cultures as available   Height: 5' 6"  (167.6 cm) Weight: 76.2 kg (168 lb) IBW/kg (Calculated) : 63.8  Temp (24hrs), Avg:99.3 F (37.4 C), Min:98.1 F (36.7 C), Max:101 F (38.3 C)  Recent Labs  Lab 09/02/21 0310  WBC 9.3  CREATININE 1.04  LATICACIDVEN 1.5    Estimated Creatinine Clearance: 54.5 mL/min (by C-G formula based on SCr of 1.04 mg/dL).    Allergies  Allergen Reactions   Ibuprofen Anaphylaxis and Other (See Comments)    Upper GI Bled   Nsaids Other (See Comments)    GI Bleed      Dosage will likely remain stable at above dose  and need for further dosage adjustment appears unlikely at present.    Will sign off at this time.  Please reconsult if a change in clinical status warrants re-evaluation of dosage.    Thank you for allowing pharmacy to be a part of this patient's care.   Royetta Asal, PharmD, BCPS 09/02/2021 9:22 AM

## 2021-09-02 NOTE — ED Provider Notes (Signed)
[facility] DEPT Provider Note   CSN: [id number] Arrival date & time: 09/02/21  0245     History Chief Complaint  Patient presents with   Altered Mental Status   Level 5 caveat due to altered mental status James [name]. is a 75 y.o. male.  The history is provided by the patient and the spouse. The history is limited by the condition of the patient.  Altered Mental Status Presenting symptoms: confusion and disorientation   Severity:  Moderate Most recent episode:  Today Timing:  Constant Progression:  Worsening Chronicity:  New Associated symptoms: fever   Patient with history of CML, depression, hypertension presents with altered mental status.  Patient had right shoulder surgery yesterday and was discharged home.  Wife reports that tonight he began acting confused and was intermittently agitated.  She is concerned that he has a UTI  Patient reports he only has pain in his right shoulder.  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/2020   OSA on CPAP 10/30/2017   Hypogonadism, male 10/30/2017   Iron deficiency anemia due to chronic blood loss 11/20/2016   Erosive gastritis with hemorrhage    Melena    Acute blood loss anemia 09/27/2016   Acute upper GI bleed 09/27/2016   Abnormal liver diagnostic imaging 06/22/2016   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   Portal vein thrombosis 04/29/2016   Diabetes mellitus ([place]) 04/29/2016   Leukocytosis 04/29/2016   Dyspnea 12/31/2015   Essential hypertension 12/31/2015   Hyperlipidemia 12/31/2015   Nocturia more than twice per night 11/11/2014   Severe obesity (BMI >= 40) ([place]) 11/11/2014   Hypersomnia with sleep apnea 11/11/2014   Polycythemia vera ([place]) 11/29/2011    Past Surgical History:  Procedure Laterality Date   CARDIAC CATHETERIZATION     greater 10 yrs ago, normal   CARPAL TUNNEL RELEASE     bilateral   COLONOSCOPY  06/2005   [facility] Lajoyce [facility]   ESOPHAGOGASTRODUODENOSCOPY (EGD) WITH PROPOFOL N/A 09/28/2016   Procedure: ESOPHAGOGASTRODUODENOSCOPY (EGD) WITH PROPOFOL;  Surgeon: Gatha Mayer, MD;  Location: [name];  Service: Endoscopy;  Laterality: N/A;   IR GENERIC HISTORICAL  12/12/2016   IR US GUIDE VASC ACCESS RIGHT 12/12/2016 [name], MD [facility]   IR GENERIC HISTORICAL  12/12/2016   IR  VENOGRAM HEPATIC W HEMODYNAMIC EVALUATION 12/12/2016 [name], MD [facility]   IR GENERIC HISTORICAL  12/12/2016   IR TRANSCATHETER BX 12/12/2016 [name], MD [facility]   ROTATOR CUFF REPAIR     right   TENDON REPAIR     left arm   TONSILLECTOMY     TOTAL KNEE ARTHROPLASTY Right 2010   WISDOM TOOTH EXTRACTION         Family History  Problem Relation Age of Onset   Bipolar disorder Son        committed suicide   Colon cancer Other    Heart failure Other    Heart attack Other    Lung disease Father        history   Diabetes Mother        history   Stroke Mother         history   Colon polyps Neg Hx    Rectal cancer Neg Hx    Stomach cancer Neg Hx    Esophageal cancer Neg Hx     Social History   Tobacco Use   Smoking status: Former    Types: Pipe    Quit date: 1975    Years since quitting: 47.7   Smokeless tobacco: Never  Vaping Use   Vaping Use: Never used  Substance Use Topics   Alcohol use: Yes    Alcohol/week: 0.0 standard drinks   Drug use: No    Home Medications Prior to Admission medications   Medication Sig Start Date End Date Taking? Authorizing Provider  buPROPion (WELLBUTRIN XL) 300 MG 24 hr tablet Take 300 mg by mouth every morning. 07/20/20   [provider]  cephALEXin (KEFLEX) 500 MG capsule Take 2,000 mg by mouth See admin instructions. Prior to dental procedure    [provider]  cyclobenzaprine (FLEXERIL) 10 MG tablet Take 1 tablet (10 mg total) by mouth 3 (three) times daily as needed for muscle spasms. 09/01/21   Shuford, Olivia Mackie, PA-C  dasatinib (SPRYCEL) 70 MG tablet TAKE 1 TABLET (70 MG TOTAL) BY MOUTH DAILY. 06/08/21 06/08/22  [name], MD  desmopressin (DDAVP) 0.2 MG tablet Take 0.4 mg by mouth at bedtime. 03/21/14   [provider]  fluticasone (FLONASE) 50 MCG/ACT nasal spray Place 1 spray into both nostrils daily as needed for allergies. 10/23/19   [provider]  glucose blood test strip OneTouch Ultra Blue Test Strip  USE TO TEST BLOOD SUGAR ONCE DAILY    [provider]  Lidocaine 4 % PTCH Apply 1 patch topically daily as needed (pain).    [provider]  LORazepam (ATIVAN) 0.5 MG tablet Take 1 mg by mouth at bedtime. 11/01/18   [provider]  metFORMIN (GLUCOPHAGE-XR) 500 MG 24 hr tablet Take 500 mg by mouth 2 (two) times daily. 11/08/20   [provider]  moxifloxacin (VIGAMOX) 0.5 % ophthalmic solution PLACE 1 DROP IN RIGHT EYE FOUR TIMES A DAY USE THE DAY BEFORE, DAY OF, AND DAY AFTER TREATMENT. 02/15/18   [provider]   ondansetron (ZOFRAN) 4 MG tablet Take 1 tablet (4 mg total) by mouth every 8 (eight) hours as needed for nausea or vomiting. 09/01/21   Shuford, Olivia Mackie, PA-C  oxyCODONE-acetaminophen (PERCOCET) 5-325 MG tablet Take 1 tablet by mouth every 4 (four) hours as needed (max 6 q). 09/01/21   Shuford, Olivia Mackie, PA-C  polyethylene glycol (MIRALAX / GLYCOLAX) 17 g packet Take 17 g by mouth daily as needed for  moderate constipation.    [provider]  PRESCRIPTION MEDICATION every 8 (eight) weeks. Gets injections in right eye at dr's office every 8 weeks     [provider]  rosuvastatin (CRESTOR) 20 MG tablet Take 20 mg by mouth at bedtime.    [provider]  sildenafil (VIAGRA) 50 MG tablet Take 50 mg by mouth daily as needed for erectile dysfunction. 10/26/14   [provider]  testosterone cypionate (DEPOTESTOSTERONE CYPIONATE) 200 MG/ML injection Inject 200 mg into the muscle every 21 ( twenty-one) days.    [provider]  TRULICITY 1.5 MO/2.9UT SOPN Inject 1.5 mg into the muscle every Monday. 05/21/20   [provider]  vitamin B-12 (CYANOCOBALAMIN) 1000 MCG tablet Take 1,000 mcg by mouth daily.    [provider]  XARELTO 10 MG TABS tablet TAKE 1 TABLET BY MOUTH EVERY DAY 08/12/20   [name], MD  zolpidem (AMBIEN) 10 MG tablet Take 10 mg by mouth at bedtime. 05/21/20   [provider]    Allergies    Ibuprofen and Nsaids  Review of Systems   Review of Systems  Unable to perform ROS: Mental status change  Constitutional:  Positive for fever.  Psychiatric/Behavioral:  Positive for confusion.    Physical Exam Updated Vital Signs BP 136/71   Pulse 84   Temp 99.1 F (37.3 C) (Oral)   Resp 20   Ht 1.676 m (5' 6" )   Wt 76.2 kg   SpO2 95%   BMI 27.12 kg/m   Physical Exam CONSTITUTIONAL: Elderly and disheveled HEAD: Normocephalic/atraumatic EYES: EOMI/PERRL ENMT: Mucous membranes moist NECK: supple no meningeal  signs SPINE/BACK:entire spine nontender CV: S1/S2 noted, no murmurs/rubs/gallops noted LUNGS: Lungs are clear to auscultation bilaterally, no apparent distress ABDOMEN: soft, nontender, no rebound or guarding, bowel sounds noted throughout abdomen GU: Patient wearing a diaper, normal external genitalia, nurse present for exam NEURO: Pt is awake/alert moves all extremitiesx4.  No facial droop.  Patient appears distracted and does not answer all questions appropriately.  He thinks it is the year 2012 EXTREMITIES: pulses normal/equal, full ROM, bandage noted to right shoulder with localized bruising.  No erythema.  No crepitus see photo SKIN: warm, color normal PSYCH: Anxious and mildly agitated     ED Results / Procedures / Treatments   Labs (all labs ordered are listed, but only abnormal results are displayed) Labs Reviewed  COMPREHENSIVE METABOLIC PANEL - Abnormal; Notable for the following components:      Result Value   Sodium 134 (*)    Glucose, Bld 166 (*)    All other components within normal limits  CBC WITH DIFFERENTIAL/PLATELET - Abnormal; Notable for the following components:   RDW 17.0 (*)    Lymphs Abs 0.6 (*)    Monocytes Absolute 2.0 (*)    All other components within normal limits  PROTIME-INR - Abnormal; Notable for the following components:   Prothrombin Time 19.1 (*)    INR 1.6 (*)    All other components within normal limits  URINALYSIS, ROUTINE W REFLEX MICROSCOPIC - Abnormal; Notable for the following components:   Glucose, UA 100 (*)    All other components within normal limits  RESP PANEL BY RT-PCR (FLU A&B, COVID) ARPGX2  URINE CULTURE  CULTURE, BLOOD (ROUTINE X 2)  CULTURE, BLOOD (ROUTINE X 2)  LACTIC ACID, PLASMA  APTT    EKG EKG Interpretation  Date/Time:  Friday September 02 2021 03:07:05 EDT Ventricular Rate:  73 PR  Interval:  165 QRS Duration: 87 QT Interval:  378 QTC Calculation: 417 R Axis:   -58 Text Interpretation: Sinus rhythm  Left anterior fascicular block Interpretation limited secondary to artifact Confirmed by Ripley Fraise [id number]) on 09/02/2021 3:10:55 AM  Radiology CT HEAD WO CONTRAST (5MM)  Result Date: 09/02/2021 CLINICAL DATA:  Mental status change EXAM: CT HEAD WITHOUT CONTRAST TECHNIQUE: Contiguous axial images were obtained from the base of the skull through the vertex without intravenous contrast. COMPARISON:  05/28/2021 FINDINGS: Brain: No evidence of acute infarction, hemorrhage, hydrocephalus, extra-axial collection or mass lesion/mass effect. Low-density in the cerebral white matter attributed to chronic small vessel ischemia. Vascular: Vertebrobasilar calcification and tortuosity often related to chronic hypertension. No hyperdense vessel. Skull: Normal. Negative for fracture or focal lesion. Sinuses/Orbits: Minor left mastoid opacification with negative nasopharynx. IMPRESSION: No acute or interval finding. Electronically Signed   By: Jorje Guild M.D.   On: 09/02/2021 05:24   DG Chest Port 1 View  Result Date: 09/02/2021 CLINICAL DATA:  Altered mental status. History of right shoulder surgery on September 14th. EXAM: PORTABLE CHEST 1 VIEW COMPARISON:  April 07, 2021 FINDINGS: Low lung volumes are seen with mild right infrahilar linear scarring and/or atelectasis. There is no evidence of acute infiltrate, pleural effusion or pneumothorax. The heart size and mediastinal contours are within normal limits. A right shoulder replacement is seen without evidence of surrounding lucency to suggest the presence of hardware loosening or infection. IMPRESSION: 1. No acute cardiopulmonary process. 2. Right shoulder replacement without evidence of hardware complication. Electronically Signed   By: Virgina Norfolk M.D.   On: 09/02/2021 03:30    Procedures Procedures   Medications Ordered in ED Medications  lactated ringers bolus 1,000 mL (0 mLs Intravenous Stopped 09/02/21 0458)  acetaminophen (TYLENOL) tablet  650 mg (650 mg Oral Given 09/02/21 0325)    ED Course  I have reviewed the triage vital signs and the nursing notes.  Pertinent labs & imaging results that were available during my care of the patient were reviewed by me and considered in my medical decision making (see chart for details).    MDM Rules/Calculators/A&P                           Pt just had right reverse shoulder arthroplasty Later in the evening he was confused He is now febrile Sepsis orders have been placed 5:53 AM Extensive work-up thus far has been unremarkable.  His vital signs and temperature have improved However patient is still confused.  He has no headache and his neck is supple, low suspicion for meningitis No signs of UTI or pneumonia.  His COVID-19 testing is negative CT head negative (pt recently off anticoagulation for surgery) However due to ongoing confusion he will be admitted for observation.  This could be a postoperative delirium or related to the recent anesthesia Informed patient and wife the plan.  Patient is currently in a sling postoperatively and it is recommended to keep his right arm as stationary as possible  Discussed with Dr. Tennis Must for admission Final Clinical Impression(s) / ED Diagnoses Final diagnoses:  Postoperative delirium    Rx / DC Orders ED Discharge Orders     None        Ripley Fraise, MD 09/02/21 [phone]

## 2021-09-02 NOTE — Progress Notes (Addendum)
PROGRESS NOTE    James [name].  PIR:[id number] DOB: 1946-03-06 DOA: 09/02/2021 PCP: Jarome Matin, MD   Brief Narrative: 75 year old with past medical history significant for anxiety, depression, osteoarthritis, chronic myelocytic leukemia, type 2 diabetes, herpes zoster, hypertension, right eye macular degeneration, polycythemia vera and SAH, cirrhosis, portal vein thrombosis, sleep apnea on CPAP who underwent right shoulder surgery the day prior to admission presented with altered mental status.  Per wife, was mildly confused after they arrived home from surgery he was complaining some pain, so he took some Percocet.  Patient seems more confused and restless, for the reason she brought him to the hospital.  In the ED he was found to be febrile temperature 101.  UA unremarkable, chest x-ray negative.  Blood cultures obtained.  CT head without contrast did not show any acute interval finding. Patient admitted for further evaluation  Assessment & Plan:   Principal Problem:   Acute encephalopathy Active Problems:   Polycythemia vera (HCC)   Essential hypertension   Hyperlipidemia   Portal vein thrombosis   Liver cirrhosis secondary to NASH (HCC)   OSA on CPAP   Type 2 diabetes mellitus with hyperglycemia (HCC)   SIRS (systemic inflammatory response syndrome) (HCC)  1-Acute metabolic encephalopathy: Suspect related to fever, concern for aspiration Avoid Percocet, will order tramadol for pain. Frequent orientation Check ammonia level.   2-SIRS: Concern for aspiration pneumonia. Follow urine culture and blood culture.  Complaining of dysuria. Plan to start Unasyn.  3-Polycythemia vera: Follow-up with Dr. Myna Hidalgo. Holding  Sprycel due to possible active infection.   4-Portal vein thrombosis: Resume Xarelto  5-essential hypertension: Resume Norvasc.   Hyperlipidemia: Continue with rosuvastatin  NASH Check ammonia.   Recent Shoulder sx;  Dr Rennis Chris informed of  admission. He will see patient in the ED. Ok to resume Xarelto.   OSA on CPAP;  Continue with CPAP.   DM; continue to hold oral medications.  SSI.     Estimated body mass index is 27.12 kg/m as calculated from the following:   Height as of this encounter: 5\' 6"  (1.676 m).   Weight as of this encounter: 76.2 kg.   DVT prophylaxis: resume xarelto.  Code Status: Full code Family Communication: wife who was at bedside.  Disposition Plan:  Status is: Observation  The patient remains OBS appropriate and will d/c before 2 midnights.  Dispo: The patient is from: Home              Anticipated d/c is to: Home              Patient currently is not medically stable to d/c.   Difficult to place patient No        Consultants:  Dr Rennis Chris with ortho informed of patient admission.   Procedures:  None  Antimicrobials:  Unasyn 9/16  Subjective: He is still confuse, per wife he didn't know he was in the ED. He report dysuria and cough.  He had shoulder sx yesterday.   Objective: Vitals:   09/02/21 0505 09/02/21 0630 09/02/21 0800 09/02/21 1132  BP: 136/71 134/65 (!) 142/89 (!) 154/81  Pulse: 84 82 78 77  Resp: 20 15 20  (!) 25  Temp:      TempSrc:      SpO2: 95% 92% 96% 93%  Weight:      Height:        Intake/Output Summary (Last 24 hours) at 09/02/2021 1348 Last data filed at 09/02/2021 0458 Gross per 24 hour  Intake 1000 ml  Output --  Net 1000 ml   Filed Weights   09/02/21 0254  Weight: 76.2 kg    Examination:  General exam: Appears calm and comfortable  Respiratory system: Clear to auscultation. Respiratory effort normal. Cardiovascular system: S1 & S2 heard, RRR. No JVD, murmurs, rubs, gallops or clicks. No pedal edema. Gastrointestinal system: Abdomen is nondistended, soft and nontender. No organomegaly or masses felt. Normal bowel sounds heard. Central nervous system: Alert and oriented person, still confuse, follows command.  Extremities: Symmetric 5 x  5 power.    Data Reviewed: I have personally reviewed following labs and imaging studies  CBC: Recent Labs  Lab 09/02/21 0310  WBC 9.3  NEUTROABS 6.7  HGB 14.4  HCT 44.1  MCV 93.8  PLT 204   Basic Metabolic Panel: Recent Labs  Lab 09/02/21 0310  NA 134*  K 4.2  CL 101  CO2 25  GLUCOSE 166*  BUN 15  CREATININE 1.04  CALCIUM 10.0   GFR: Estimated Creatinine Clearance: 54.5 mL/min (by C-G formula based on SCr of 1.04 mg/dL). Liver Function Tests: Recent Labs  Lab 09/02/21 0310  AST 23  ALT 17  ALKPHOS 77  BILITOT 1.0  PROT 6.8  ALBUMIN 3.8   No results for input(s): LIPASE, AMYLASE in the last 168 hours. No results for input(s): AMMONIA in the last 168 hours. Coagulation Profile: Recent Labs  Lab 09/02/21 0310  INR 1.6*   Cardiac Enzymes: No results for input(s): CKTOTAL, CKMB, CKMBINDEX, TROPONINI in the last 168 hours. BNP (last 3 results) No results for input(s): PROBNP in the last 8760 hours. HbA1C: No results for input(s): HGBA1C in the last 72 hours. CBG: Recent Labs  Lab 09/01/21 0804 09/01/21 1251  GLUCAP 97 107*   Lipid Profile: No results for input(s): CHOL, HDL, LDLCALC, TRIG, CHOLHDL, LDLDIRECT in the last 72 hours. Thyroid Function Tests: No results for input(s): TSH, T4TOTAL, FREET4, T3FREE, THYROIDAB in the last 72 hours. Anemia Panel: No results for input(s): VITAMINB12, FOLATE, FERRITIN, TIBC, IRON, RETICCTPCT in the last 72 hours. Sepsis Labs: Recent Labs  Lab 09/02/21 0310  LATICACIDVEN 1.5    Recent Results (from the past 240 hour(s))  Surgical pcr screen     Status: None   Collection Time: 08/24/21 11:39 AM   Specimen: Nasal Mucosa; Nasal Swab  Result Value Ref Range Status   MRSA, PCR NEGATIVE NEGATIVE Final   Staphylococcus aureus NEGATIVE NEGATIVE Final    Comment: (NOTE) The Xpert SA Assay (FDA approved for NASAL specimens in patients 19 years of age and older), is one component of a comprehensive surveillance  program. It is not intended to diagnose infection nor to guide or monitor treatment. Performed at [facility], [address] [place], [place] [address]   Resp Panel by RT-PCR (Flu A&B, Covid) Nasopharyngeal Swab     Status: None   Collection Time: 09/02/21  3:10 AM   Specimen: Nasopharyngeal Swab; Nasopharyngeal(NP) swabs in vial transport medium  Result Value Ref Range Status   SARS Coronavirus 2 by RT PCR NEGATIVE NEGATIVE Final    Comment: (NOTE) SARS-CoV-2 target nucleic acids are NOT DETECTED.  The SARS-CoV-2 RNA is generally detectable in upper respiratory specimens during the acute phase of infection. The lowest concentration of SARS-CoV-2 viral copies this assay can detect is 138 copies/mL. A negative result does not preclude SARS-Cov-2 infection and should not be used as the sole basis for treatment or other patient management decisions. A negative  result may occur with  improper specimen collection/handling, submission of specimen other than nasopharyngeal swab, presence of viral mutation(s) within the areas targeted by this assay, and inadequate number of viral copies(<138 copies/mL). A negative result must be combined with clinical observations, patient history, and epidemiological information. The expected result is Negative.  Fact Sheet for Patients:  BloggerCourse.com  Fact Sheet for Healthcare Providers:  SeriousBroker.it  This test is no t yet approved or cleared by the [place] FDA and  has been authorized for detection and/or diagnosis of SARS-CoV-2 by FDA under an Emergency Use Authorization (EUA). This EUA will remain  in effect (meaning this test can be used) for the duration of the COVID-19 declaration under Section 564(b)(1) of the Act, 21 U.S.C.section 360bbb-3(b)(1), unless the authorization is terminated  or revoked sooner.       Influenza A by PCR NEGATIVE NEGATIVE Final    Influenza B by PCR NEGATIVE NEGATIVE Final    Comment: (NOTE) The Xpert Xpress SARS-CoV-2/FLU/RSV plus assay is intended as an aid in the diagnosis of influenza from Nasopharyngeal swab specimens and should not be used as a sole basis for treatment. Nasal washings and aspirates are unacceptable for Xpert Xpress SARS-CoV-2/FLU/RSV testing.  Fact Sheet for Patients: BloggerCourse.com  Fact Sheet for Healthcare Providers: SeriousBroker.it  This test is not yet approved or cleared by the [place] FDA and has been authorized for detection and/or diagnosis of SARS-CoV-2 by FDA under an Emergency Use Authorization (EUA). This EUA will remain in effect (meaning this test can be used) for the duration of the COVID-19 declaration under Section 564(b)(1) of the Act, 21 U.S.C. section 360bbb-3(b)(1), unless the authorization is terminated or revoked.  Performed at [facility], [address], [place], [place] [address]          Radiology Studies: CT HEAD WO CONTRAST ( )  Result Date: 09/02/2021 CLINICAL DATA:  Mental status change EXAM: CT HEAD WITHOUT CONTRAST TECHNIQUE: Contiguous axial images were obtained from the base of the skull through the vertex without intravenous contrast. COMPARISON:  05/28/2021 FINDINGS: Brain: No evidence of acute infarction, hemorrhage, hydrocephalus, extra-axial collection or mass lesion/mass effect. Low-density in the cerebral white matter attributed to chronic small vessel ischemia. Vascular: Vertebrobasilar calcification and tortuosity often related to chronic hypertension. No hyperdense vessel. Skull: Normal. Negative for fracture or focal lesion. Sinuses/Orbits: Minor left mastoid opacification with negative nasopharynx. IMPRESSION: No acute or interval finding. Electronically Signed   By: Tiburcio Pea M.D.   On: 09/02/2021 05:24   DG Chest Port 1 View  Result Date:  09/02/2021 CLINICAL DATA:  Altered mental status. History of right shoulder surgery on September 14th. EXAM: PORTABLE CHEST 1 VIEW COMPARISON:  April 07, 2021 FINDINGS: Low lung volumes are seen with mild right infrahilar linear scarring and/or atelectasis. There is no evidence of acute infiltrate, pleural effusion or pneumothorax. The heart size and mediastinal contours are within normal limits. A right shoulder replacement is seen without evidence of surrounding lucency to suggest the presence of hardware loosening or infection. IMPRESSION: 1. No acute cardiopulmonary process. 2. Right shoulder replacement without evidence of hardware complication. Electronically Signed   By: Aram Candela M.D.   On: 09/02/2021 03:30        Scheduled Meds:  rivaroxaban  10 mg Oral Daily   Continuous Infusions:  ampicillin-sulbactam (UNASYN) IV Stopped (09/02/21 1118)     LOS: 0 days    Time spent: 35 minutes,     Jaylani Mcguinn A Kamaria Lucia,  MD [facility]   If 7PM-7AM, please contact night-coverage [email or web address]  09/02/2021, 1:48 PM

## 2021-09-02 NOTE — ED Notes (Signed)
Pt is not in the room

## 2021-09-02 NOTE — Telephone Encounter (Signed)
Call received from patient's wife stating that pt is currently in the ER and would like to know if it is ok if pt takes the Sprycel a few hours late today.  Pt.'s wife notified per order of Dr. [name] that it is ok to hold Sprycel while pt is in the ER.  Pt.'s wife is appreciative of assistance and has no further questions at this time.

## 2021-09-02 NOTE — H&P (Signed)
History and Physical    [name]. OLI:[id number] DOB: [birth year] DOA: 09/02/2021  PCP: Leanna Battles, MD   Patient coming from: Home.  I have personally briefly reviewed patient's old medical records in [place]  Chief Complaint: Altered mental status.  HPI: James [name]. is a 75 y.o. male with medical history significant of anxiety, depression, osteoarthritis, chronic myelocytic leukemia, type II DM, GERD/hiatal hernia, herpes zoster, hypertension, hyperlipidemia, right eye macular degeneration, nocturia, overweight, polycythemia vera, and SAH cirrhosis, portal vein thrombosis, sleep apnea on CPAP who underwent right shoulder surgery yesterday and is brought to the emergency department via EMS due to altered mental status.  The patient's wife stated that after the surgery when they arrived home he seemed to be mildly confused.  He was complaining of pain so he received a tablet of Percocet 5/325 mg and after this became progressively more confused and restless.  According to his wife, he has not slept since he woke up from surgery.  He is still confused but knows he is in the hospital, but does not know why.  When he is asked about yesterday's events he does not remember.    ED Course: Initial vital signs were temperature 98.9 F, but subsequently while in the ER the patient had a fever of 101.0 F, pulse 80, respirations 17, BP 141/78 mmHg O2 sat 95% on room air.  The patient received a 1000 mL LR bolus and 650 mg of acetaminophen p.o. x1.  Lab work: His urinalysis showed glucosuria 100 mg/dL, but was otherwise unremarkable.  CBC was normal with a white count 9.3, hemoglobin 14.4 g/dL platelets 204.  PT was 19.1, INR 1.6 PTT 32.  CMP showed a glucose of 166 mg/dL, all other values are normal when sodium is corrected to glucose level.  Lactic acid x1 was normal.  Blood cultures x2 were drawn.  Imaging: 1 view chest radiograph showed no acute cardiopulmonary process.  Right  shoulder replacement seen without any evidence of hardware complication.  CT head without contrast did not show any acute or interval finding.  Please see images and full radiology report for further detail.  Review of Systems: As per HPI otherwise all other systems reviewed and are negative.  Past Medical History:  Diagnosis Date   Anxiety    Arthritis    Clotting disorder ([place])    CML (chronic myelocytic leukemia) ([place]) 12/16/2020   Depression    DM (diabetes mellitus) (HCC)    GERD (gastroesophageal reflux disease)    Hiatal hernia    History of shingles 04/03/2014   HTN (hypertension)    Hyperlipidemia    Insomnia    resolved by using CPAP   Iron deficiency anemia due to chronic blood loss 11/20/2016   Macular degeneration    Rt eye   Nocturia more than twice per night 11/11/2014   For 6 month, 5 nocturias a night.    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)    History   Portal vein thrombosis    Rhinitis    Situational depression    Sleep apnea    uses CPAP every night   Tubular adenoma 12/10/2015   6 cecum polyps   Past Surgical History:  Procedure Laterality Date   CARDIAC CATHETERIZATION     greater 10 yrs ago, normal   CARPAL TUNNEL RELEASE     bilateral   COLONOSCOPY  06/2005   [facility] Dr Lajoyce [place]ers   ESOPHAGOGASTRODUODENOSCOPY (EGD) WITH PROPOFOL N/A 09/28/2016  Procedure: ESOPHAGOGASTRODUODENOSCOPY (EGD) WITH PROPOFOL;  Surgeon: Gatha Mayer, MD;  Location: [facility];  Service: Endoscopy;  Laterality: N/A;   IR GENERIC HISTORICAL  12/12/2016   IR US GUIDE VASC ACCESS RIGHT 12/12/2016 [name], MD [facility]   IR GENERIC HISTORICAL  12/12/2016   IR VENOGRAM HEPATIC W HEMODYNAMIC EVALUATION 12/12/2016 [name], MD [facility]   IR GENERIC HISTORICAL  12/12/2016   IR TRANSCATHETER BX 12/12/2016 [name], MD [facility]   ROTATOR CUFF REPAIR     right   TENDON REPAIR     left arm   TONSILLECTOMY     TOTAL KNEE ARTHROPLASTY Right 2010    WISDOM TOOTH EXTRACTION     Social History  reports that he quit smoking about 47 years ago. His smoking use included pipe. He has never used smokeless tobacco. He reports current alcohol use. He reports that he does not use drugs.  Allergies  Allergen Reactions   Ibuprofen Anaphylaxis and Other (See Comments)    Upper GI Bled   Nsaids Other (See Comments)    GI Bleed    Family History  Problem Relation Age of Onset   Bipolar disorder Son        committed suicide   Colon cancer Other    Heart failure Other    Heart attack Other    Lung disease Father        history   Diabetes Mother        history   Stroke Mother        history   Colon polyps Neg Hx    Rectal cancer Neg Hx    Stomach cancer Neg Hx    Esophageal cancer Neg Hx    Prior to Admission medications   Medication Sig Start Date End Date Taking? Authorizing Provider  buPROPion (WELLBUTRIN XL) 300 MG 24 hr tablet Take 300 mg by mouth every morning. 07/20/20   [provider]  cephALEXin (KEFLEX) 500 MG capsule Take 2,000 mg by mouth See admin instructions. Prior to dental procedure    [provider]  cyclobenzaprine (FLEXERIL) 10 MG tablet Take 1 tablet (10 mg total) by mouth 3 (three) times daily as needed for muscle spasms. 09/01/21   Shuford, Olivia Mackie, PA-C  dasatinib (SPRYCEL) 70 MG tablet TAKE 1 TABLET (70 MG TOTAL) BY MOUTH DAILY. 06/08/21 06/08/22  [name], MD  desmopressin (DDAVP) 0.2 MG tablet Take 0.4 mg by mouth at bedtime. 03/21/14   [provider]  fluticasone (FLONASE) 50 MCG/ACT nasal spray Place 1 spray into both nostrils daily as needed for allergies. 10/23/19   [provider]  glucose blood test strip OneTouch Ultra Blue Test Strip  USE TO TEST BLOOD SUGAR ONCE DAILY    [provider]  Lidocaine 4 % PTCH Apply 1 patch topically daily as needed (pain).    [provider]  LORazepam (ATIVAN) 0.5 MG tablet Take 1 mg by mouth at bedtime. 11/01/18    [provider]  metFORMIN (GLUCOPHAGE-XR) 500 MG 24 hr tablet Take 500 mg by mouth 2 (two) times daily. 11/08/20   [provider]  moxifloxacin (VIGAMOX) 0.5 % ophthalmic solution PLACE 1 DROP IN RIGHT EYE FOUR TIMES A DAY USE THE DAY BEFORE, DAY OF, AND DAY AFTER TREATMENT. 02/15/18   [provider]  ondansetron (ZOFRAN) 4 MG tablet Take 1 tablet (4 mg total) by mouth every 8 (eight) hours as needed for nausea or vomiting. 09/01/21  Shuford, Olivia Mackie, PA-C  oxyCODONE-acetaminophen (PERCOCET) 5-325 MG tablet Take 1 tablet by mouth every 4 (four) hours as needed (max 6 q). 09/01/21   Shuford, Olivia Mackie, PA-C  polyethylene glycol (MIRALAX / GLYCOLAX) 17 g packet Take 17 g by mouth daily as needed for moderate constipation.    [provider]  PRESCRIPTION MEDICATION every 8 (eight) weeks. Gets injections in right eye at dr's office every 8 weeks     [provider]  rosuvastatin (CRESTOR) 20 MG tablet Take 20 mg by mouth at bedtime.    [provider]  sildenafil (VIAGRA) 50 MG tablet Take 50 mg by mouth daily as needed for erectile dysfunction. 10/26/14   [provider]  testosterone cypionate (DEPOTESTOSTERONE CYPIONATE) 200 MG/ML injection Inject 200 mg into the muscle every 21 ( twenty-one) days.    [provider]  TRULICITY 1.5 QQ/5.9DG SOPN Inject 1.5 mg into the muscle every Monday. 05/21/20   [provider]  vitamin B-12 (CYANOCOBALAMIN) 1000 MCG tablet Take 1,000 mcg by mouth daily.    [provider]  XARELTO 10 MG TABS tablet TAKE 1 TABLET BY MOUTH EVERY DAY 08/12/20   [name], MD  zolpidem (AMBIEN) 10 MG tablet Take 10 mg by mouth at bedtime. 05/21/20   [provider]    Physical Exam: Vitals:   09/02/21 0315 09/02/21 0423 09/02/21 0425 09/02/21 0505  BP: (!) 161/76  138/85 136/71  Pulse: 76  74 84  Resp: 20  (!) 24 20  Temp:  99.1 F (37.3 C)    TempSrc:  Oral    SpO2: 98%  92% 95%   Weight:      Height:        Constitutional: NAD, calm, comfortable Eyes: PERRL, lids and conjunctivae normal ENMT: Mucous membranes are moist. Posterior pharynx clear of any exudate or lesions. Neck: normal, supple, no masses, no thyromegaly Respiratory: clear to auscultation bilaterally, no wheezing, no crackles. Normal respiratory effort. No accessory muscle use.  Cardiovascular: Regular rate and rhythm, no murmurs / rubs / gallops. No extremity edema. 2+ pedal pulses. No carotid bruits.  Abdomen: Obese, bowel sounds positive.  Soft, no tenderness, no masses palpated. No hepatosplenomegaly.  Musculoskeletal: RUE has sling present.  No clubbing / cyanosis.  Good ROM, no contractures. Normal muscle tone.  Skin: There are areas of ecchymosis on RUE and right shoulder. Neurologic: CN 2-12 grossly intact. Sensation intact, DTR normal. Strength 5/5 in all 4.  Psychiatric: Normal judgment and insight. Alert and oriented x 3. Normal mood.   Labs on Admission: I have personally reviewed following labs and imaging studies  CBC: Recent Labs  Lab 09/02/21 0310  WBC 9.3  NEUTROABS 6.7  HGB 14.4  HCT 44.1  MCV 93.8  PLT 387    Basic Metabolic Panel: Recent Labs  Lab 09/02/21 0310  NA 134*  K 4.2  CL 101  CO2 25  GLUCOSE 166*  BUN 15  CREATININE 1.04  CALCIUM 10.0    GFR: Estimated Creatinine Clearance: 54.5 mL/min (by C-G formula based on SCr of 1.04 mg/dL).  Liver Function Tests: Recent Labs  Lab 09/02/21 0310  AST 23  ALT 17  ALKPHOS 77  BILITOT 1.0  PROT 6.8  ALBUMIN 3.8   Urine analysis:    Component Value Date/Time   COLORURINE YELLOW 09/02/2021 0310   APPEARANCEUR CLEAR 09/02/2021 0310   LABSPEC 1.020 09/02/2021 0310   PHURINE 7.0 09/02/2021 0310   GLUCOSEU 100 (A) 09/02/2021 0310  HGBUR NEGATIVE 09/02/2021 0310   BILIRUBINUR NEGATIVE 09/02/2021 0310   KETONESUR NEGATIVE 09/02/2021 0310   PROTEINUR NEGATIVE 09/02/2021 0310        NITRITE NEGATIVE  09/02/2021 0310   LEUKOCYTESUR NEGATIVE 09/02/2021 0310   Radiological Exams on Admission: CT HEAD WO CONTRAST (5MM)  Result Date: 09/02/2021 CLINICAL DATA:  Mental status change EXAM: CT HEAD WITHOUT CONTRAST TECHNIQUE: Contiguous axial images were obtained from the base of the skull through the vertex without intravenous contrast. COMPARISON:  05/28/2021 FINDINGS: Brain: No evidence of acute infarction, hemorrhage, hydrocephalus, extra-axial collection or mass lesion/mass effect. Low-density in the cerebral white matter attributed to chronic small vessel ischemia. Vascular: Vertebrobasilar calcification and tortuosity often related to chronic hypertension. No hyperdense vessel. Skull: Normal. Negative for fracture or focal lesion. Sinuses/Orbits: Minor left mastoid opacification with negative nasopharynx. IMPRESSION: No acute or interval finding. Electronically Signed   By: Jorje Guild M.D.   On: 09/02/2021 05:24   DG Chest Port 1 View  Result Date: 09/02/2021 CLINICAL DATA:  Altered mental status. History of right shoulder surgery on September 14th. EXAM: PORTABLE CHEST 1 VIEW COMPARISON:  April 07, 2021 FINDINGS: Low lung volumes are seen with mild right infrahilar linear scarring and/or atelectasis. There is no evidence of acute infiltrate, pleural effusion or pneumothorax. The heart size and mediastinal contours are within normal limits. A right shoulder replacement is seen without evidence of surrounding lucency to suggest the presence of hardware loosening or infection. IMPRESSION: 1. No acute cardiopulmonary process. 2. Right shoulder replacement without evidence of hardware complication. Electronically Signed   By: Virgina Norfolk M.D.   On: 09/02/2021 03:30    EKG: Independently reviewed.  Vent. rate 73 BPM PR interval 165 ms QRS duration 87 ms QT/QTcB 378/417 ms P-R-T axes 55 -58 43 Sinus rhythm Left anterior fascicular block  Assessment/Plan Principal Problem:   Acute  encephalopathy Toxic versus metabolic Observation/telemetry. Hold sedating medications. Frequent neurochecks. Consider psych evaluation. Consider MRI of brain if no improvement.  Active Problems:   SIRS (systemic inflammatory response syndrome) (HCC) Anesthesia?  Postop? Will defer antibiotics for now. Follow-up blood cultures and sensitivity.    Polycythemia vera ([place]) Following with Dr. [name]. Continue Sprycel 70 mg p.o. daily. Resume Xarelto when appropriate. Phlebotomy per hematology's orders.    Essential hypertension Monitor blood pressure.    Hyperlipidemia Continue rosuvastatin 20 mg p.o. daily.    Portal vein thrombosis On Xarelto. Resume when safe postoperatively.    Liver cirrhosis secondary to NASH (HCC) No signs of hepatic decompensation.    OSA on CPAP Continue CPAP at bedtime.   DVT prophylaxis: On Xarelto (held). Code Status:   Full code. Family Communication:  His wife was seen in the ED room. Disposition Plan:   Patient is from:  Home.  Anticipated DC to:  Home.  Anticipated DC date:  09/03/2021.  Anticipated DC barriers: Clinical status.  Consults called:   Admission status:  Observation/telemetry.   Severity of Illness: High severity after presenting with altered mental status in the setting of acute encephalopathy likely due to anesthesia and/or oxycodone/cyclobenzaprine.  The patient will remain for close monitoring.  Reubin Milan MD [facility]  How to contact the [facility] Attending or Consulting provider [place] or covering provider during after hours [place], for this patient?   Check the care team in [facility] and look for a) attending/consulting [facility] provider listed and b) the [facility] team listed Log into [email or web address] and use 's universal password  to access. If you do not have the password, please contact the hospital operator. Locate the [facility] provider you are looking for under [facility] and page to a number that  you can be directly reached. If you still have difficulty reaching the provider, please page the [facility] (Director on Call) for the Hospitalists listed on amion for assistance.  09/02/2021, 5:57 AM   This document was prepared using Dragon voice recognition software and may contain some unintended transcription errors.

## 2021-09-03 DIAGNOSIS — D45 Polycythemia vera: Secondary | ICD-10-CM | POA: Diagnosis present

## 2021-09-03 DIAGNOSIS — Z7901 Long term (current) use of anticoagulants: Secondary | ICD-10-CM | POA: Diagnosis not present

## 2021-09-03 DIAGNOSIS — G8929 Other chronic pain: Secondary | ICD-10-CM | POA: Diagnosis present

## 2021-09-03 DIAGNOSIS — J690 Pneumonitis due to inhalation of food and vomit: Secondary | ICD-10-CM | POA: Diagnosis present

## 2021-09-03 DIAGNOSIS — Z856 Personal history of leukemia: Secondary | ICD-10-CM | POA: Diagnosis not present

## 2021-09-03 DIAGNOSIS — M75101 Unspecified rotator cuff tear or rupture of right shoulder, not specified as traumatic: Secondary | ICD-10-CM | POA: Diagnosis present

## 2021-09-03 DIAGNOSIS — I81 Portal vein thrombosis: Secondary | ICD-10-CM

## 2021-09-03 DIAGNOSIS — K7460 Unspecified cirrhosis of liver: Secondary | ICD-10-CM

## 2021-09-03 DIAGNOSIS — M19011 Primary osteoarthritis, right shoulder: Secondary | ICD-10-CM | POA: Diagnosis present

## 2021-09-03 DIAGNOSIS — R6510 Systemic inflammatory response syndrome (SIRS) of non-infectious origin without acute organ dysfunction: Secondary | ICD-10-CM

## 2021-09-03 DIAGNOSIS — Z800 Family history of malignant neoplasm of digestive organs: Secondary | ICD-10-CM | POA: Diagnosis not present

## 2021-09-03 DIAGNOSIS — I10 Essential (primary) hypertension: Secondary | ICD-10-CM | POA: Diagnosis present

## 2021-09-03 DIAGNOSIS — F05 Delirium due to known physiological condition: Secondary | ICD-10-CM | POA: Diagnosis present

## 2021-09-03 DIAGNOSIS — T40605A Adverse effect of unspecified narcotics, initial encounter: Secondary | ICD-10-CM | POA: Diagnosis present

## 2021-09-03 DIAGNOSIS — G4733 Obstructive sleep apnea (adult) (pediatric): Secondary | ICD-10-CM

## 2021-09-03 DIAGNOSIS — Z9989 Dependence on other enabling machines and devices: Secondary | ICD-10-CM

## 2021-09-03 DIAGNOSIS — Z96651 Presence of right artificial knee joint: Secondary | ICD-10-CM | POA: Diagnosis present

## 2021-09-03 DIAGNOSIS — Y92009 Unspecified place in unspecified non-institutional (private) residence as the place of occurrence of the external cause: Secondary | ICD-10-CM | POA: Diagnosis not present

## 2021-09-03 DIAGNOSIS — K7581 Nonalcoholic steatohepatitis (NASH): Secondary | ICD-10-CM | POA: Diagnosis present

## 2021-09-03 DIAGNOSIS — E1165 Type 2 diabetes mellitus with hyperglycemia: Secondary | ICD-10-CM | POA: Diagnosis present

## 2021-09-03 DIAGNOSIS — Z86718 Personal history of other venous thrombosis and embolism: Secondary | ICD-10-CM | POA: Diagnosis not present

## 2021-09-03 DIAGNOSIS — Z20822 Contact with and (suspected) exposure to covid-19: Secondary | ICD-10-CM | POA: Diagnosis present

## 2021-09-03 DIAGNOSIS — Z86010 Personal history of colonic polyps: Secondary | ICD-10-CM | POA: Diagnosis not present

## 2021-09-03 DIAGNOSIS — E785 Hyperlipidemia, unspecified: Secondary | ICD-10-CM

## 2021-09-03 DIAGNOSIS — G9341 Metabolic encephalopathy: Secondary | ICD-10-CM | POA: Diagnosis present

## 2021-09-03 DIAGNOSIS — Z87891 Personal history of nicotine dependence: Secondary | ICD-10-CM | POA: Diagnosis not present

## 2021-09-03 DIAGNOSIS — G9340 Encephalopathy, unspecified: Secondary | ICD-10-CM | POA: Diagnosis present

## 2021-09-03 DIAGNOSIS — Z7984 Long term (current) use of oral hypoglycemic drugs: Secondary | ICD-10-CM | POA: Diagnosis not present

## 2021-09-03 DIAGNOSIS — F32A Depression, unspecified: Secondary | ICD-10-CM | POA: Diagnosis present

## 2021-09-03 LAB — BASIC METABOLIC PANEL
Anion gap: 10 (ref 5–15)
BUN: 13 mg/dL (ref 8–23)
CO2: 23 mmol/L (ref 22–32)
Calcium: 10 mg/dL (ref 8.9–10.3)
Chloride: 103 mmol/L (ref 98–111)
Creatinine, Ser: 0.84 mg/dL (ref 0.61–1.24)
GFR, Estimated: >60 mL/min (ref >60)
Glucose, Bld: 132 mg/dL — ABNORMAL HIGH (ref 70–99)
Potassium: 4.1 mmol/L (ref 3.5–5.1)
Sodium: 136 mmol/L (ref 135–145)

## 2021-09-03 LAB — CBC
HCT: 42.7 % (ref 39.0–52.0)
Hemoglobin: 14.3 g/dL (ref 13.0–17.0)
MCH: 30.8 pg (ref 26.0–34.0)
MCHC: 33.5 g/dL (ref 30.0–36.0)
MCV: 92 fL (ref 80.0–100.0)
Platelets: 187 10*3/uL (ref 150–400)
RBC: 4.64 MIL/uL (ref 4.22–5.81)
RDW: 17 % — ABNORMAL HIGH (ref 11.5–15.5)
WBC: 9.4 10*3/uL (ref 4.0–10.5)
nRBC: 0 % (ref 0.0–0.2)

## 2021-09-03 LAB — URINE CULTURE: Culture: NO GROWTH

## 2021-09-03 LAB — GLUCOSE, CAPILLARY
Glucose-Capillary: 122 mg/dL — ABNORMAL HIGH (ref 70–99)
Glucose-Capillary: 175 mg/dL — ABNORMAL HIGH (ref 70–99)
Glucose-Capillary: 108 mg/dL — ABNORMAL HIGH (ref 70–99)
Glucose-Capillary: 157 mg/dL — ABNORMAL HIGH (ref 70–99)

## 2021-09-03 MED ORDER — SODIUM CHLORIDE 0.9% FLUSH: 10.0000 mL | INTRAVENOUS | Status: DC | PRN

## 2021-09-03 MED ORDER — SODIUM CHLORIDE 0.9% FLUSH
10.0000 mL | Freq: Two times a day (BID) | INTRAVENOUS | Status: DC
  Administered 2021-09-03 – 2021-09-04 (×2): 10 mL

## 2021-09-03 MED ORDER — ROSUVASTATIN CALCIUM 10 MG PO TABS
20.0000 mg | ORAL_TABLET | Freq: Every day | ORAL | Status: DC
  Administered 2021-09-03: 20 mg via ORAL
  Filled 2021-09-03: qty 2

## 2021-09-03 MED ORDER — BUPROPION HCL ER (XL) 300 MG PO TB24
300.0000 mg | ORAL_TABLET | Freq: Every morning | ORAL | Status: DC
  Administered 2021-09-03 – 2021-09-04 (×2): 300 mg via ORAL
  Filled 2021-09-03 (×2): qty 1

## 2021-09-03 MED ORDER — RIVAROXABAN 10 MG PO TABS
10.0000 mg | ORAL_TABLET | Freq: Every day | ORAL | Status: DC
  Administered 2021-09-04: 10 mg via ORAL
  Filled 2021-09-03: qty 1

## 2021-09-03 MED ORDER — POLYETHYLENE GLYCOL 3350 17 G PO PACK
17.0000 g | PACK | Freq: Every day | ORAL | Status: DC | PRN
  Administered 2021-09-04: 17 g via ORAL
  Filled 2021-09-03: qty 1

## 2021-09-03 MED ORDER — FLUTICASONE PROPIONATE 50 MCG/ACT NA SUSP
1.0000 | Freq: Every day | NASAL | Status: DC | PRN
  Filled 2021-09-03: qty 16

## 2021-09-03 NOTE — Progress Notes (Signed)
PROGRESS NOTE    James [name].  ZDG:[id number] DOB: 23-Apr-1946 DOA: 09/02/2021 PCP: Leanna Battles, MD    Chief Complaint  Patient presents with   Altered Mental Status    Brief Narrative:  75 year old with past medical history significant for anxiety, depression, osteoarthritis, chronic myelocytic leukemia, type 2 diabetes, herpes zoster, hypertension, right eye macular degeneration, polycythemia vera and SAH, cirrhosis, portal vein thrombosis, sleep apnea on CPAP who underwent right shoulder surgery the day prior to admission presented with altered mental status.  Per wife, was mildly confused after they arrived home from surgery he was complaining some pain, so he took some Percocet.  Patient seems more confused and restless, for the reason she brought him to the hospital.   In the ED he was found to be febrile temperature 101.  UA unremarkable, chest x-ray negative.  Blood cultures obtained.  CT head without contrast did not show any acute interval finding. Patient admitted for further evaluation   Assessment & Plan:   Principal Problem:   Acute encephalopathy Active Problems:   Polycythemia vera ([place])   Essential hypertension   Hyperlipidemia   Portal vein thrombosis   Liver cirrhosis secondary to NASH (HCC)   OSA on CPAP   Type 2 diabetes mellitus with hyperglycemia (HCC)   SIRS (systemic inflammatory response syndrome) (HCC)   #1 acute metabolic encephalopathy -Likely multifactorial secondary to fevers (concern for aspiration pneumonia) and narcotic pain medication. -CT head negative. -Chest x-ray with no acute infiltrate on admission, right shoulder replacement without evidence of hardware complication. -Patient has improved clinically alert to self place and time. -Continue empiric IV Unasyn -Percocet discontinued and patient placed on tramadol as needed for pain.  2.  SIRS -Concern for aspiration pneumonia. -Patient pancultured with urine cultures negative,  blood cultures pending with no growth to date. -Fever curve trending down. -Continue empiric IV Unasyn.  3.  Polycythemia vera -Being followed by Dr. [name], hematology/oncology in the outpatient setting. -Sprycel held due to concern for active infection however wife states she gave patient a dose of his Sprycel this morning. -Explained to wife why Sprycel was held and that we will need to discuss with patient's primary hematologist/oncologist as to when this may be resumed in light of concern for acute infection.  4.  Portal vein thrombosis -Xarelto.  5.  Hypertension -Continue home regimen Norvasc.  6.  Hyperlipidemia -Statin.  7.  NASH -Ammonia levels within normal limits. -Outpatient follow-up.  8.  Recent right shoulder surgery -Dr. Onnie Graham, orthopedics informed of patient's admission and had stated okay to resume Xarelto which has been resumed. -Percocet changed to tramadol secondary to problem #1. -Per orthopedics.  9.  OSA on CPAP -CPAP nightly.  10.  Well-controlled diabetes mellitus type 2 -Hemoglobin A1c 6.0 (08/24/2021) -CBG 122 this morning. -Continue to hold oral hypoglycemic agents. -SSI.   DVT prophylaxis: Xarelto Code Status: Full Family Communication: Updated patient and wife at bedside. Disposition:   Status is: Inpatient    Dispo: The patient is from: Home              Anticipated d/c is to: Home              Patient currently is not medically stable to d/c.   Difficult to place patient No       Consultants:  Dr. Onnie Graham with orthopedics informed of patient's admission.  Procedures:  CT head 09/02/2021 Chest x-ray 09/02/2021    Antimicrobials:  IV Unasyn 09/02/2021>>>>>   Subjective:  Patient laying in bed.  Alert and oriented x3.  Denies any chest pain.  No shortness of breath.  No abdominal pain.  Complaining of right shoulder pain/discomfort.  Denies any dysuria.  Overall feeling better.  States he has been using the incentive  spirometry.  Wife stated that she gave him a dose of his Sprycel this morning.  Objective: Vitals:   09/02/21 2154 09/03/21 0138 09/03/21 0622 09/03/21 1257  BP: (!) 149/79 (!) 143/77 (!) 161/86 (!) 150/85  Pulse: 86 87 89 76  Resp: 20 20 20 20   Temp: 98.2 F (36.8 C) 98.9 F (37.2 C) 99.5 F (37.5 C) 97.6 F (36.4 C)  TempSrc: Oral Oral Oral   SpO2: 95% 94% 95% 98%  Weight:      Height:        Intake/Output Summary (Last 24 hours) at 09/03/2021 1344 Last data filed at 09/03/2021 1300 Gross per 24 hour  Intake 912.67 ml  Output 202 ml  Net 710.67 ml   Filed Weights   09/02/21 0254  Weight: 76.2 kg    Examination:  General exam: Appears calm and comfortable  Respiratory system: Clear to auscultation anterior lung fields. Respiratory effort normal. Cardiovascular system: S1 & S2 heard, RRR. No JVD, murmurs, rubs, gallops or clicks. No pedal edema. Gastrointestinal system: Abdomen is nondistended, soft and nontender. No organomegaly or masses felt. Normal bowel sounds heard. Central nervous system: Alert and oriented. No focal neurological deficits. Extremities: Right upper extremity in sling.  Skin: No rashes, lesions or ulcers Psychiatry: Judgement and insight appear normal. Mood & affect appropriate.     Data Reviewed: I have personally reviewed following labs and imaging studies  CBC: Recent Labs  Lab 09/02/21 0310 09/03/21 0531  WBC 9.3 9.4  NEUTROABS 6.7  --   HGB 14.4 14.3  HCT 44.1 42.7  MCV 93.8 92.0  PLT 204 678    Basic Metabolic Panel: Recent Labs  Lab 09/02/21 0310 09/03/21 0531  NA 134* 136  K 4.2 4.1  CL 101 103  CO2 25 23  GLUCOSE 166* 132*  BUN 15 13  CREATININE 1.04 0.84  CALCIUM 10.0 10.0    GFR: Estimated Creatinine Clearance: 67.5 mL/min (by C-G formula based on SCr of 0.84 mg/dL).  Liver Function Tests: Recent Labs  Lab 09/02/21 0310  AST 23  ALT 17  ALKPHOS 77  BILITOT 1.0  PROT 6.8  ALBUMIN 3.8    CBG: Recent  Labs  Lab 09/01/21 1251 09/02/21 1701 09/02/21 2152 09/03/21 0733 09/03/21 1115  GLUCAP 107* 100* 145* 122* 175*     Recent Results (from the past 240 hour(s))  Urine Culture     Status: None   Collection Time: 09/02/21  3:10 AM   Specimen: In/Out Cath Urine  Result Value Ref Range Status   Specimen Description   Final    IN/OUT CATH URINE Performed at [facility], [place] [address], [place], [place] [address]    Special Requests   Final    NONE Performed at [facility], [place] [address] [place], [place] [address]    Culture   Final    NO GROWTH Performed at [facility], [place] [address] [place], [place] [address]    Report Status 09/03/2021 FINAL  Final  Blood Culture (routine x 2)     Status: None (Preliminary result)   Collection Time: 09/02/21  3:10 AM   Specimen: Left Antecubital; Blood  Result Value Ref Range  Status   Specimen Description   Final    LEFT ANTECUBITAL Performed at [place] [address] [place], [place] [address]    Special Requests   Final    BOTTLES DRAWN AEROBIC AND ANAEROBIC Blood Culture results may not be optimal due to an inadequate volume of blood received in culture bottles Performed at [place] [address], [place], [place] [address]    Culture   Final    NO GROWTH 1 DAY Performed at [facility], [place] [address] [place], [place] [address]    Report Status PENDING  Incomplete  Blood Culture (routine x 2)     Status: None (Preliminary result)   Collection Time: 09/02/21  3:10 AM   Specimen: BLOOD RIGHT HAND  Result Value Ref Range Status   Specimen Description   Final    BLOOD RIGHT HAND Performed at [place] [address], [place], [place] [address]    Special Requests   Final    BOTTLES DRAWN AEROBIC ONLY Blood Culture adequate volume Performed at [place] [address],  [place], [place] [address]    Culture   Final    NO GROWTH 1 DAY Performed at [facility], [place] [address], [place], [place] [address]    Report Status PENDING  Incomplete  Resp Panel by RT-PCR (Flu A&B, Covid) Nasopharyngeal Swab     Status: None   Collection Time: 09/02/21  3:10 AM   Specimen: Nasopharyngeal Swab; Nasopharyngeal(NP) swabs in vial transport medium  Result Value Ref Range Status   SARS Coronavirus 2 by RT PCR NEGATIVE NEGATIVE Final    Comment: (NOTE) SARS-CoV-2 target nucleic acids are NOT DETECTED.  The SARS-CoV-2 RNA is generally detectable in upper respiratory specimens during the acute phase of infection. The lowest concentration of SARS-CoV-2 viral copies this assay can detect is 138 copies/mL. A negative result does not preclude SARS-Cov-2 infection and should not be used as the sole basis for treatment or other patient management decisions. A negative result may occur with  improper specimen collection/handling, submission of specimen other than nasopharyngeal swab, presence of viral mutation(s) within the areas targeted by this assay, and inadequate number of viral copies(<138 copies/mL). A negative result must be combined with clinical observations, patient history, and epidemiological information. The expected result is Negative.  Fact Sheet for Patients:  EntrepreneurPulse.com.au  Fact Sheet for Healthcare Providers:  IncredibleEmployment.be  This test is no t yet approved or cleared by the [place] FDA and  has been authorized for detection and/or diagnosis of SARS-CoV-2 by FDA under an Emergency Use Authorization (EUA). This EUA will remain  in effect (meaning this test can be used) for the duration of the COVID-19 declaration under Section 564(b)(1) of the Act, 21 U.S.C.section 360bbb-3(b)(1), unless the authorization is terminated  or revoked sooner.       Influenza A by PCR NEGATIVE NEGATIVE Final    Influenza B by PCR NEGATIVE NEGATIVE Final    Comment: (NOTE) The Xpert Xpress SARS-CoV-2/FLU/RSV plus assay is intended as an aid in the diagnosis of influenza from Nasopharyngeal swab specimens and should not be used as a sole basis for treatment. Nasal washings and aspirates are unacceptable for Xpert Xpress SARS-CoV-2/FLU/RSV testing.  Fact Sheet for Patients: EntrepreneurPulse.com.au  Fact Sheet for Healthcare Providers: IncredibleEmployment.be  This test is not yet approved or cleared by the [place] FDA and has been authorized for detection and/or  diagnosis of SARS-CoV-2 by FDA under an Emergency Use Authorization (EUA). This EUA will remain in effect (meaning this test can be used) for the duration of the COVID-19 declaration under Section 564(b)(1) of the Act, 21 U.S.C. section 360bbb-3(b)(1), unless the authorization is terminated or revoked.  Performed at [facility], [place] [address] [place], [place] [address]          Radiology Studies: CT HEAD WO CONTRAST (5MM)  Result Date: 09/02/2021 CLINICAL DATA:  Mental status change EXAM: CT HEAD WITHOUT CONTRAST TECHNIQUE: Contiguous axial images were obtained from the base of the skull through the vertex without intravenous contrast. COMPARISON:  05/28/2021 FINDINGS: Brain: No evidence of acute infarction, hemorrhage, hydrocephalus, extra-axial collection or mass lesion/mass effect. Low-density in the cerebral white matter attributed to chronic small vessel ischemia. Vascular: Vertebrobasilar calcification and tortuosity often related to chronic hypertension. No hyperdense vessel. Skull: Normal. Negative for fracture or focal lesion. Sinuses/Orbits: Minor left mastoid opacification with negative nasopharynx. IMPRESSION: No acute or interval finding. Electronically Signed   By: Jorje Guild M.D.   On: 09/02/2021 05:24   DG Chest Port 1 View  Result Date:  09/02/2021 CLINICAL DATA:  Altered mental status. History of right shoulder surgery on September 14th. EXAM: PORTABLE CHEST 1 VIEW COMPARISON:  April 07, 2021 FINDINGS: Low lung volumes are seen with mild right infrahilar linear scarring and/or atelectasis. There is no evidence of acute infiltrate, pleural effusion or pneumothorax. The heart size and mediastinal contours are within normal limits. A right shoulder replacement is seen without evidence of surrounding lucency to suggest the presence of hardware loosening or infection. IMPRESSION: 1. No acute cardiopulmonary process. 2. Right shoulder replacement without evidence of hardware complication. Electronically Signed   By: Virgina Norfolk M.D.   On: 09/02/2021 03:30        Scheduled Meds:  amLODipine  5 mg Oral BID   buPROPion  300 mg Oral q morning   insulin aspart  0-6 Units Subcutaneous TID WC   [START ON 09/04/2021] rivaroxaban  10 mg Oral Daily   rosuvastatin  20 mg Oral QHS   Continuous Infusions:  ampicillin-sulbactam (UNASYN) IV Stopped (09/03/21 1130)     LOS: 0 days    Time spent: 35 minutes    Irine Seal, MD [facility]   To contact the attending provider between 7A-7P or the covering provider during after hours 7P-7A, please log into the web site [email or web address] and access using universal [place] password for that web site. If you do not have the password, please call the hospital operator.  09/03/2021, 1:44 PM

## 2021-09-03 NOTE — Evaluation (Signed)
Clinical/Bedside Swallow Evaluation Patient Details  Name: James [name]. MRN: [id number] Date of Birth: 03-06-1946  Today's Date: 09/03/2021 Time: SLP Start Time (ACUTE ONLY): 1750 SLP Stop Time (ACUTE ONLY): 1805 SLP Time Calculation (min) (ACUTE ONLY): 15 min  Past Medical History:  Past Medical History:  Diagnosis Date   Anxiety    Arthritis    Clotting disorder ([place])    CML (chronic myelocytic leukemia) ([place]) 12/16/2020   Depression    DM (diabetes mellitus) (HCC)    GERD (gastroesophageal reflux disease)    Hiatal hernia    History of shingles 04/03/2014   HTN (hypertension)    Hyperlipidemia    Insomnia    resolved by using CPAP   Iron deficiency anemia due to chronic blood loss 11/20/2016   Macular degeneration    Rt eye   Nocturia more than twice per night 11/11/2014   For 6 month, 5 nocturias a night.    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)    History   Portal vein thrombosis    Rhinitis    Situational depression    Sleep apnea    uses CPAP every night   Tubular adenoma 12/10/2015   6 cecum polyps   Past Surgical History:  Past Surgical History:  Procedure Laterality Date   CARDIAC CATHETERIZATION     greater 10 yrs ago, normal   CARPAL TUNNEL RELEASE     bilateral   COLONOSCOPY  06/2005   [facility] [place]   ESOPHAGOGASTRODUODENOSCOPY (EGD) WITH PROPOFOL N/A 09/28/2016   Procedure: ESOPHAGOGASTRODUODENOSCOPY (EGD) WITH PROPOFOL;  Surgeon: Gatha Mayer, MD;  Location: [facility];  Service: Endoscopy;  Laterality: N/A;   IR GENERIC HISTORICAL  12/12/2016   IR US GUIDE VASC ACCESS RIGHT 12/12/2016 [name], MD [facility]   IR GENERIC HISTORICAL  12/12/2016   IR VENOGRAM HEPATIC W HEMODYNAMIC EVALUATION 12/12/2016 [name], MD [facility]   IR GENERIC HISTORICAL  12/12/2016   IR TRANSCATHETER BX 12/12/2016 [name], MD [facility]   ROTATOR CUFF REPAIR     right   TENDON REPAIR     left arm   TONSILLECTOMY     TOTAL KNEE  ARTHROPLASTY Right 2010   WISDOM TOOTH EXTRACTION     HPI:  75 yo male adm to [facility] after dc'd home s/p right shoulder surgery and exhibiting odd behavior.  Pt was discharged receiving Percocet and Flexeril for pain.  PMH + HTN, HLD, Type 2 DM, CML, GERD, rhinitis, insomnia, liver cirrhosis due to NASH.  MD ordered swallow eval.  CT head and CXR negative.  Pt denies dysphagia and his wife reports pt with gustatory changes due to medication for CML.  Pt has undergone esophageal endsocopy showing diminutive non-bleeding erosions were in the prepyloric region of the stomach 09/2016.    Assessment / Plan / Recommendation  Clinical Impression  Pt with functional oropharyngeal swallow ability - no clinical indication of aspiration or dysphagia nor focal CN deficits.  He had difficulty self feeding due to right shoulder surgery - but wife present and helping to feed pt.  Adequate mastication and oral clearance noted.  Pt easily passed 3 ounce [place] water screen.  Wife reports pt with gustatory changes which she attributes to his medications for his CML.  Recommend continue regular diet.  No follow up needed. SLP Visit Diagnosis: Dysphagia, unspecified (R13.10)    Aspiration Risk  No limitations    Diet Recommendation Regular;Thin liquid   Liquid Administration via:  Straw;Cup Medication Administration: Whole meds with liquid Compensations: Slow rate;Small sips/bites Postural Changes: Seated upright at 90 degrees;Remain upright for at least 30 minutes after po intake    Other  Recommendations Oral Care Recommendations: Oral care BID    Recommendations for follow up therapy are one component of a multi-disciplinary discharge planning process, led by the attending physician.  Recommendations may be updated based on patient status, additional functional criteria and insurance authorization.  Follow up Recommendations None      Frequency and Duration     N/a       Prognosis   N/a     Swallow  Study   General Date of Onset: 09/03/21 HPI: 75 yo male adm to [facility] after dc'd home s/p right shoulder surgery and exhibiting odd behavior.  Pt was discharged receiving Percocet and Flexeril for pain.  PMH + HTN, HLD, Type 2 DM, CML, GERD, rhinitis, insomnia, liver cirrhosis due to NASH.  MD ordered swallow eval.  CT head and CXR negative.  Pt denies dysphagia and his wife reports pt with gustatory changes due to medication for CML.  Pt has undergone esophageal endsocopy showing diminutive non-bleeding erosions were in the prepyloric region of the stomach 09/2016. Type of Study: Bedside Swallow Evaluation Previous Swallow Assessment: endoscopy Diet Prior to this Study: Regular;Thin liquids Temperature Spikes Noted: No Respiratory Status: Room air History of Recent Intubation: No Behavior/Cognition: Alert;Cooperative;Pleasant mood Oral Cavity Assessment: Within Functional Limits Oral Care Completed by SLP: No Oral Cavity - Dentition: Adequate natural dentition Vision: Functional for self-feeding Self-Feeding Abilities: Needs assist (pt with right arm in a sling) Patient Positioning: Upright in bed Baseline Vocal Quality: Normal Volitional Cough: Strong Volitional Swallow: Able to elicit    Oral/Motor/Sensory Function Overall Oral Motor/Sensory Function: Within functional limits   Ice Chips Ice chips: Not tested   Thin Liquid Thin Liquid: Within functional limits Presentation: Cup Other Comments: 3 ounce [place] water screen easily passed    Nectar Thick Nectar Thick Liquid: Not tested   Honey Thick Honey Thick Liquid: Not tested   Puree Puree: Not tested   Solid     Solid: Within functional limits Presentation: Vivi Ferns 09/03/2021,7:52 PM Kathleen Lime, MS [place] Office [phone] Pager [phone]

## 2021-09-04 ENCOUNTER — Other Ambulatory Visit: Payer: Self-pay | Admitting: Hematology & Oncology

## 2021-09-04 LAB — BASIC METABOLIC PANEL
Anion gap: 8 (ref 5–15)
BUN: 13 mg/dL (ref 8–23)
CO2: 24 mmol/L (ref 22–32)
Calcium: 9.4 mg/dL (ref 8.9–10.3)
Chloride: 102 mmol/L (ref 98–111)
Creatinine, Ser: 0.94 mg/dL (ref 0.61–1.24)
GFR, Estimated: >60 mL/min (ref >60)
Glucose, Bld: 148 mg/dL — ABNORMAL HIGH (ref 70–99)
Potassium: 3.8 mmol/L (ref 3.5–5.1)
Sodium: 134 mmol/L — ABNORMAL LOW (ref 135–145)

## 2021-09-04 LAB — CBC WITH DIFFERENTIAL/PLATELET
Abs Immature Granulocytes: 0.06 10*3/uL (ref 0.00–0.07)
Basophils Absolute: 0 10*3/uL (ref 0.0–0.1)
Basophils Relative: 0 %
Eosinophils Absolute: 0 10*3/uL (ref 0.0–0.5)
Eosinophils Relative: 0 %
HCT: 42.4 % (ref 39.0–52.0)
Hemoglobin: 13.9 g/dL (ref 13.0–17.0)
Immature Granulocytes: 1 %
Lymphocytes Relative: 7 %
Lymphs Abs: 0.8 10*3/uL (ref 0.7–4.0)
MCH: 30.4 pg (ref 26.0–34.0)
MCHC: 32.8 g/dL (ref 30.0–36.0)
MCV: 92.8 fL (ref 80.0–100.0)
Monocytes Absolute: 1.6 10*3/uL — ABNORMAL HIGH (ref 0.1–1.0)
Monocytes Relative: 14 %
Neutro Abs: 8.4 10*3/uL — ABNORMAL HIGH (ref 1.7–7.7)
Neutrophils Relative %: 78 %
Platelets: 245 10*3/uL (ref 150–400)
RBC: 4.57 MIL/uL (ref 4.22–5.81)
RDW: 17.2 % — ABNORMAL HIGH (ref 11.5–15.5)
WBC: 10.8 10*3/uL — ABNORMAL HIGH (ref 4.0–10.5)
nRBC: 0 % (ref 0.0–0.2)

## 2021-09-04 LAB — MAGNESIUM: Magnesium: 2.2 mg/dL (ref 1.7–2.4)

## 2021-09-04 LAB — GLUCOSE, CAPILLARY
Glucose-Capillary: 138 mg/dL — ABNORMAL HIGH (ref 70–99)
Glucose-Capillary: 158 mg/dL — ABNORMAL HIGH (ref 70–99)
Glucose-Capillary: 114 mg/dL — ABNORMAL HIGH (ref 70–99)

## 2021-09-04 MED ORDER — SORBITOL 70 % SOLN
30.0000 mL | Freq: Once | Status: AC
  Administered 2021-09-04: 30 mL via ORAL
  Filled 2021-09-04: qty 30

## 2021-09-04 MED ORDER — TRAMADOL HCL 50 MG PO TABS: 25.0000 mg | ORAL_TABLET | Freq: Three times a day (TID) | ORAL | 0 refills | Status: DC | PRN

## 2021-09-04 MED ORDER — AMOXICILLIN-POT CLAVULANATE 875-125 MG PO TABS: 1.0000 | ORAL_TABLET | Freq: Two times a day (BID) | ORAL | 0 refills | Status: AC

## 2021-09-04 MED ORDER — DASATINIB 70 MG PO TABS
70.0000 mg | ORAL_TABLET | Freq: Every day | ORAL | Status: DC
Start: 2021-09-08 — End: 2021-10-14

## 2021-09-04 MED ORDER — AMOXICILLIN-POT CLAVULANATE 875-125 MG PO TABS
1.0000 | ORAL_TABLET | Freq: Two times a day (BID) | ORAL | Status: DC
Start: 2021-09-04 — End: 2021-09-04
  Administered 2021-09-04: 1 via ORAL
  Filled 2021-09-04: qty 1

## 2021-09-04 MED ORDER — ACETAMINOPHEN 325 MG PO TABS: 650.0000 mg | ORAL_TABLET | Freq: Four times a day (QID) | ORAL | Status: DC | PRN

## 2021-09-04 NOTE — TOC Transition Note (Addendum)
Transition of Care [facility]) - CM/SW Discharge Note   Patient Details  Name: James [name]. MRN: [id number] Date of Birth: March 07, 1946  [facility] CM/SW Contact:  Pammy Vesey, Marta Lamas, LCSW Phone Number: 09/04/2021, 2:11 PM   Clinical Narrative:     Patient is medically stable and ready for discharge, returning home to live with wife.  LCSW noted physician order for home health physical therapy; however, patient reported that he would prefer to continue to receive outpatient physical therapy twice a week (Tuesday's and Thursday's) at [facility] [phone]).  LCSW collaboration with Internal Medicine Physician, [name] to ensure that outpatient physical therapy is sufficient and to cancel orders for home health physical therapy.  Wife at bedside and agreed to transport patient back home.  Patient Goals and CMS Choice   N/A  Discharge Placement   N/A          Discharge Plan and Services   Home w/ wife.  Social Determinants of Health (SDOH) Interventions   N/A  Readmission Risk Interventions No flowsheet data found.    Nat Christen, BSW, MSW, [employer]  Licensed Holiday representative  [employer]  Mailing Address-[address] [place], [place] [address] Physical Address-[address], [place],  [address] Toll Free Main # [phone] Fax # [phone] Cell # [phone]  [email or web address]

## 2021-09-04 NOTE — Evaluation (Addendum)
Physical Therapy Evaluation Patient Details Name: James [name]. MRN: [id number] DOB: December 21, 1945 Today's Date: 09/04/2021  History of Present Illness  Patient is a 75 year old male who presented to hospital 1 day after rTSA with altered mental status. patient was found to have acute metabolic encephalopathy. PMH: polycythmia vera, anxiety, depression, chromic myelocytic lukemia.  Clinical Impression    Pt admitted with above diagnosis.  Pt amb ~ 200' with min assist/HHA. Pt is extremely unsteady however stability improved with distance.  Will likely need assist for mobility initially at home. (unsure if pt wife cane assist ?)  Recommend HHPT vs OPPT/OT.  Pt currently with functional limitations due to the deficits listed below (see PT Problem List). Pt will benefit from skilled PT to increase their independence and safety with mobility to allow discharge to the venue listed below.        Recommendations for follow up therapy are one component of a multi-disciplinary discharge planning process, led by the attending physician.  Recommendations may be updated based on patient status, additional functional criteria and insurance authorization.  Follow Up Recommendations Home health PT;Follow surgeon's recommendation for DC plan and follow-up therapies will likely need 24* supervision depending on cognition    Equipment Recommendations  None recommended by PT    Recommendations for Other Services       Precautions / Restrictions Precautions Precautions: Shoulder;Fall Type of Shoulder Precautions: If sitting in controlled environment, ok to come out of sling to give neck a break. Please sleep in it to protect until follow up in office.     OK to use operative arm for feeding, hygiene and ADLs.   Ok to instruct Pendulums and lap slides as exercises. Ok to use operative arm within the following parameters for ADL purposes     New ROM (8/18)   Ok for PROM, AAROM, AROM within pain tolerance and  within the following ROM   ER 20   ABD 45   FE 60 Shoulder Interventions: Shoulder sling/immobilizer;Off for dressing/bathing/exercises Precaution Booklet Issued: Yes (comment) Required Braces or Orthoses: Sling Restrictions Weight Bearing Restrictions: No RUE Weight Bearing: Non weight bearing      Mobility  Bed Mobility Overal bed mobility: Needs Assistance Bed Mobility: Supine to Sit     Supine to sit: Min guard;HOB elevated     General bed mobility comments: patient reported sleeping in lift chair at home and reports having "medical bed"    Transfers Overall transfer level: Needs assistance Equipment used: 1 person hand held assist Transfers: Sit to/from Stand Sit to Stand: Min guard;Min assist         General transfer comment: multiple attempts and incr time for pt to come to stand  Ambulation/Gait Ambulation/Gait assistance: Min assist;Min guard Gait Distance (Feet): 200 Feet Assistive device: IV Pole;1 person hand held assist Gait Pattern/deviations: Drifts right/left;Narrow base of support;Decreased step length - right;Decreased step length - left     General Gait Details: pt with unsteady gait, drifting and reliant on unilateral UE support for balance/safety  Stairs            Wheelchair Mobility    Modified Rankin (Stroke Patients Only)       Balance Overall balance assessment: Needs assistance Sitting-balance support: No upper extremity supported Sitting balance-Leahy Scale: Good       Standing balance-Leahy Scale: Fair Standing balance comment: reliant onL UE support for dynamic tasks  Pertinent Vitals/Pain Pain Assessment: Faces Faces Pain Scale: Hurts a little bit Pain Location: shoulder with mobility Pain Descriptors / Indicators: Sore Pain Intervention(s): Monitored during session;Limited activity within patient's tolerance    Home Living Family/patient expects to be discharged to::  Private residence Living Arrangements: Spouse/significant other Available Help at Discharge: Family;Available 24 hours/day Type of Home: House Home Access: Stairs to enter Entrance Stairs-Rails: Left Entrance Stairs-Number of Steps: 2 Home Layout: One level Home Equipment: Shower seat;Cane - quad      Prior Function Level of Independence: Independent with assistive device(s)         Comments: amb with quad cane     Hand Dominance   Dominant Hand: Right    Extremity/Trunk Assessment   Upper Extremity Assessment Upper Extremity Assessment: Defer to OT evaluation RUE Deficits / Details: patient was noted to tolerate movemnet of digits and elbow on this date. patient reported pain in shoulder with all movements. RUE: Unable to fully assess due to pain RUE Sensation: decreased light touch    Lower Extremity Assessment Lower Extremity Assessment: Overall WFL for tasks assessed    Cervical / Trunk Assessment Cervical / Trunk Assessment: Kyphotic  Communication   Communication: HOH  Cognition Arousal/Alertness: Awake/alert Behavior During Therapy: WFL for tasks assessed/performed Overall Cognitive Status: No family/caregiver present to determine baseline cognitive functioning Area of Impairment: Following commands;Safety/judgement                       Following Commands: Follows one step commands with increased time Safety/Judgement: Decreased awareness of safety     General Comments: patient was noted to have impaired timeline of current events. patient was irritable when attempting to orient patient to current time line. patient was confused about having therapy in hospital when ortho had said he would not have any therapy until 1 week from surgery. patient was educated on change in current situation needs new assessment.      General Comments      Exercises     Assessment/Plan    PT Assessment Patient needs continued PT services  PT Problem List  Decreased strength;Decreased range of motion;Decreased activity tolerance;Decreased mobility;Decreased balance;Decreased knowledge of use of DME;Decreased cognition       PT Treatment Interventions DME instruction;Therapeutic activities;Gait training;Functional mobility training;Therapeutic exercise;Patient/family education;Balance training    PT Goals (Current goals can be found in the Care Plan section)  Acute Rehab PT Goals Patient Stated Goal: patient wants to follow plan set out by ortho PT Goal Formulation: With patient Time For Goal Achievement: 09/11/21    Frequency Min 3X/week   Barriers to discharge        Co-evaluation PT/OT/SLP Co-Evaluation/Treatment: Yes Reason for Co-Treatment: For patient/therapist safety;To address functional/ADL transfers PT goals addressed during session: Mobility/safety with mobility OT goals addressed during session: ADL's and self-care       AM-PAC PT "6 Clicks" Mobility  Outcome Measure Help needed turning from your back to your side while in a flat bed without using bedrails?: A Little Help needed moving from lying on your back to sitting on the side of a flat bed without using bedrails?: A Little Help needed moving to and from a bed to a chair (including a wheelchair)?: A Little Help needed standing up from a chair using your arms (e.g., wheelchair or bedside chair)?: A Little Help needed to walk in hospital room?: A Little Help needed climbing 3-5 steps with a railing? : A Little 6 Click Score: 18  End of Session Equipment Utilized During Treatment: Gait belt Activity Tolerance: Patient tolerated treatment well Patient left: in chair;with call bell/phone within reach;with chair alarm set   PT Visit Diagnosis: Other abnormalities of gait and mobility (R26.89)    Time: 3545-6256 PT Time Calculation (min) (ACUTE ONLY): 22 min   Charges:   PT Evaluation $PT Eval Low Complexity: [place], PT  Acute Rehab Dept  ([place]) [phone] Pager [phone]  09/04/2021   [facility] 09/04/2021, 11:37 AM

## 2021-09-04 NOTE — Progress Notes (Signed)
Pt is off the unit. Explained all the discharge instruction. No question and concerns. Pt had dressing intact on his shoulder.

## 2021-09-04 NOTE — Evaluation (Signed)
Occupational Therapy Evaluation Patient Details Name: James [name]. MRN: [id number] DOB: [birth year] Today's Date: 09/04/2021   History of Present Illness Patient is a 75 year old male who presented to hospital 1 day after rTSA with altered mental status. patient was found to have acute metabolic encephalopathy. PMH: polycythmia vera, anxiety, depression, chromic myelocytic lukemia.   Clinical Impression   Patient is a 75 year old male who was admitted for above. Patient was living at home with wife prior level with rolling walker. Currently, Patient was noted to have confusion with timeline of surgery. Patient was oriented to day and timeline with patient unable to carryover during session. Patient continues to need increased assistance for bathing,UB dressing and LB dressing tasks. Patient recommended to have 24/7 care at home to maintain shoulder precautions from surgery. OT will follow acutely. D/c recommendations to follow surgeons recommendations for follow up therapy.      Recommendations for follow up therapy are one component of a multi-disciplinary discharge planning process, led by the attending physician.  Recommendations may be updated based on patient status, additional functional criteria and insurance authorization.   Follow Up Recommendations  Follow surgeon's recommendation for DC plan and follow-up therapies    Equipment Recommendations  None recommended by OT    Recommendations for Other Services       Precautions / Restrictions Precautions Precautions: Shoulder;Fall Type of Shoulder Precautions: If sitting in controlled environment, ok to come out of sling to give neck a break. Please sleep in it to protect until follow up in office.     OK to use operative arm for feeding, hygiene and ADLs.   Ok to instruct Pendulums and lap slides as exercises. Ok to use operative arm within the following parameters for ADL purposes     New ROM (8/18)   Ok for PROM, AAROM, AROM  within pain tolerance and within the following ROM   ER 20   ABD 45   FE 60 Shoulder Interventions: Shoulder sling/immobilizer;Off for dressing/bathing/exercises Precaution Booklet Issued: Yes (comment) Required Braces or Orthoses: Sling Restrictions Weight Bearing Restrictions: No RUE Weight Bearing: Non weight bearing      Mobility Bed Mobility Overal bed mobility: Needs Assistance Bed Mobility: Supine to Sit     Supine to sit: Min guard;HOB elevated     General bed mobility comments: patient reported sleeping in lift chair at home.    Transfers Overall transfer level: Needs assistance Equipment used: 1 person hand held assist Transfers: Sit to/from Stand Sit to Stand: Min guard         General transfer comment: patient reported using RW at baseline. patient was hand held assistance for functional mobility in hallway .    Balance Overall balance assessment: Needs assistance Sitting-balance support: No upper extremity supported Sitting balance-Leahy Scale: Good       Standing balance-Leahy Scale: Fair                             ADL either performed or assessed with clinical judgement   ADL Overall ADL's : Needs assistance/impaired Eating/Feeding: Set up   Grooming: Set up   Upper Body Bathing: Minimal assistance;Adhering to UE precautions;Sitting   Lower Body Bathing: Minimal assistance;Sit to/from stand   Upper Body Dressing : Maximal assistance;Sitting;Adhering to UE precautions   Lower Body Dressing: Maximal assistance;Sit to/from stand       Toileting- Water quality scientist and Hygiene: Min guard;Minimal assistance  Functional mobility during ADLs: Min guard;Minimal assistance General ADL Comments: patient was able to sit up on edge of bed with min guard with mod a to adjust sling. patient required hand held assistance for functional mobility in hallway. patient was able to adjust hospital gown with min A using LUE. patient was  unable to recall recommendations for shoulder from surgeon. patient appears to be at baseline from d/c from PACU.     Vision Patient Visual Report: No change from baseline       Perception     Praxis      Pertinent Vitals/Pain       Hand Dominance Right   Extremity/Trunk Assessment Upper Extremity Assessment Upper Extremity Assessment: RUE deficits/detail RUE Deficits / Details: patient was noted to tolerate movemnet of digits and elbow on this date. patient reported pain in shoulder with all movements. RUE: Unable to fully assess due to pain RUE Sensation: decreased light touch   Lower Extremity Assessment Lower Extremity Assessment: Defer to PT evaluation   Cervical / Trunk Assessment Cervical / Trunk Assessment: Kyphotic   Communication Communication Communication: HOH   Cognition Arousal/Alertness: Awake/alert Behavior During Therapy: WFL for tasks assessed/performed Overall Cognitive Status: No family/caregiver present to determine baseline cognitive functioning                                 General Comments: patient was noted to have impaired timeline of current events. patient was irritable when attempting to orient patient to current time line. patient was confused about having therapy in hospital when ortho had said he would not have any therapy until 1 week from surgery. patient was educated on change in current situation needs new assessment.   General Comments       Exercises     Shoulder Instructions      Home Living Family/patient expects to be discharged to:: Private residence Living Arrangements: Spouse/significant other Available Help at Discharge: Family;Available 24 hours/day Type of Home: House Home Access: Stairs to enter [employer] of Steps: 2 Entrance Stairs-Rails: Left Home Layout: One level     Bathroom Shower/Tub: Walk-in shower         Home Equipment: Shower seat          Prior  Functioning/Environment Level of Independence: Independent with assistive device(s)                 OT Problem List: Decreased strength;Decreased range of motion;Impaired UE functional use;Pain;Decreased cognition;Decreased knowledge of precautions;Decreased knowledge of use of DME or AE      OT Treatment/Interventions:      OT Goals(Current goals can be found in the care plan section) Acute Rehab OT Goals Patient Stated Goal: patient wants to follow plan set out by ortho OT Goal Formulation: With patient Time For Goal Achievement: 09/18/21 Potential to Achieve Goals: Good  OT Frequency:     Barriers to D/C:            Co-evaluation PT/OT/SLP Co-Evaluation/Treatment: Yes Reason for Co-Treatment: For patient/therapist safety;To address functional/ADL transfers PT goals addressed during session: Mobility/safety with mobility OT goals addressed during session: ADL's and self-care      AM-PAC OT "6 Clicks" Daily Activity     Outcome Measure Help from another person eating meals?: A Little Help from another person taking care of personal grooming?: A Little Help from another person toileting, which includes using toliet, bedpan, or urinal?: A Little Help  from another person bathing (including washing, rinsing, drying)?: A Little Help from another person to put on and taking off regular upper body clothing?: A Little Help from another person to put on and taking off regular lower body clothing?: A Lot 6 Click Score: 17   End of Session Equipment Utilized During Treatment: Gait belt Nurse Communication: Other (comment) (nurse cleared patient to participate)  Activity Tolerance: Patient tolerated treatment well Patient left: in chair;with call bell/phone within reach;with chair alarm set  OT Visit Diagnosis: Muscle weakness (generalized) (M62.81)                Time: 1901-2224 OT Time Calculation (min): 31 min Charges:  OT General Charges $OT Visit: 1 Visit OT  Evaluation $OT Eval Low Complexity: 1 Low  Leota Sauers, MS Acute Rehabilitation Department Office# [phone] Pager# [phone]   [place] 09/04/2021, 10:27 AM

## 2021-09-04 NOTE — Discharge Summary (Signed)
Physician Discharge Summary  James [name]. WUJ:[id number] DOB: 10-Jul-1946 DOA: 09/02/2021  PCP: Leanna Battles, MD  Admit date: 09/02/2021 Discharge date: 09/04/2021  Time spent: 55 minutes  Recommendations for Outpatient Follow-up:  Follow-up with Dr. Onnie Graham as previously scheduled. Follow-up with Leanna Battles, MD in 1 to 2 weeks.     Discharge Diagnoses:  Principal Problem:   Acute encephalopathy Active Problems:   Polycythemia vera ([place])   Essential hypertension   Hyperlipidemia   Portal vein thrombosis   Liver cirrhosis secondary to NASH (HCC)   OSA on CPAP   Type 2 diabetes mellitus with hyperglycemia (HCC)   SIRS (systemic inflammatory response syndrome) (HCC)   Acute metabolic encephalopathy   Discharge Condition: Stable and improved  Diet recommendation: Heart healthy  Filed Weights   09/02/21 0254  Weight: 76.2 kg    History of present illness:  HPI per Dr. Jinny Sanders James [name]. is a 75 y.o. male with medical history significant of anxiety, depression, osteoarthritis, chronic myelocytic leukemia, type II DM, GERD/hiatal hernia, herpes zoster, hypertension, hyperlipidemia, right eye macular degeneration, nocturia, overweight, polycythemia vera, and SAH cirrhosis, portal vein thrombosis, sleep apnea on CPAP who underwent right shoulder surgery 1 day prior to admission, and is brought to the emergency department via EMS due to altered mental status.  The patient's wife stated that after the surgery when they arrived home he seemed to be mildly confused.  He was complaining of pain so he received a tablet of Percocet 5/325 mg and after this became progressively more confused and restless.  According to his wife, he has not slept since he woke up from surgery.  He is still confused but knows he is in the hospital, but does not know why.  When he is asked about yesterday's events he does not remember.     ED Course: Initial vital signs were temperature 98.9 F, but  subsequently while in the ER the patient had a fever of 101.0 F, pulse 80, respirations 17, BP 141/78 mmHg O2 sat 95% on room air.  The patient received a 1000 mL LR bolus and 650 mg of acetaminophen p.o. x1.   Lab work: His urinalysis showed glucosuria 100 mg/dL, but was otherwise unremarkable.  CBC was normal with a white count 9.3, hemoglobin 14.4 g/dL platelets 204.  PT was 19.1, INR 1.6 PTT 32.  CMP showed a glucose of 166 mg/dL, all other values are normal when sodium is corrected to glucose level.  Lactic acid x1 was normal.  Blood cultures x2 were drawn.   Imaging: 1 view chest radiograph showed no acute cardiopulmonary process.  Right shoulder replacement seen without any evidence of hardware complication.  CT head without contrast did not show any acute or interval finding.  Please see images and full radiology report for further detail.   Hospital Course:  1 acute metabolic encephalopathy -Likely multifactorial secondary to fevers (concern for aspiration pneumonia) and narcotic pain medication. -CT head negative. -Chest x-ray with no acute infiltrate on admission, right shoulder replacement without evidence of hardware complication. -Patient narcotic was discontinued and patient placed on tramadol as needed for pain.   -Patient also placed on IV antibiotics of Unasyn and subsequently transition to Augmentin which she will be discharged home on for 3 more days to complete a 5-day course of treatment.   -Patient improved clinically and was essentially at baseline by day of discharge.   -Outpatient follow-up with PCP.  2.  SIRS -Concern for aspiration pneumonia. -Patient pancultured  with urine cultures negative, blood cultures pending with no growth to date. -Patient placed on IV Unasyn with clinical improvement. -Fever curve trending down and patient was afebrile for at least 24 hours prior to discharge. -Patient transitioned to Augmentin and will be discharged home on 3 more days of  Augmentin to complete a 5-day course of treatment. -Outpatient follow-up with PCP.  3.  Polycythemia vera -Being followed by Dr. [name], hematology/oncology in the outpatient setting. -Sprycel held due to concern for active infection however wife stated she gave patient a dose of his Sprycel on 09/03/2021.  -Explained to wife why Sprycel was held and that we will need to discuss with patient's primary hematologist/oncologist as to when this may be resumed in light of concern for acute infection.  4.  Portal vein thrombosis -Patient maintained on home regimen Xarelto.  5.  Hypertension -Controlled on home regimen of Norvasc.    6.  Hyperlipidemia -Patient maintained on statin.  7.  NASH -Ammonia levels within normal limits. -Outpatient follow-up.  8.  Recent right shoulder surgery -Dr. Onnie Graham, orthopedics informed of patient's admission and had stated okay to resume Xarelto which was resumed. -Percocet changed to tramadol secondary to problem #1. -Patient seen by PT/OT who recommended home health therapies however patient noted to have outpatient PT set up from prior hospitalization which she will follow-up with. -Orthopedics informed of patient's admission and orthopedics PA assessed the patient during the hospitalization  9.  OSA on CPAP -Patient maintained on CPAP nightly.  10.  Well-controlled diabetes mellitus type 2 -Hemoglobin A1c 6.0 (08/24/2021) -Patient's oral hypoglycemic agents were held during the hospitalization and patient maintained on sliding scale insulin.   -Oral hypoglycemic agents will be resumed on discharge.     Procedures: CT head 09/02/2021 Chest x-ray 09/02/2021  Consultations: Orthopedics: [place], [place] 09/02/2021  Discharge Exam: Vitals:   09/04/21 0837 09/04/21 1345  BP: 139/67 (!) 167/84  Pulse:  91  Resp:  18  Temp:  98.4 F (36.9 C)  SpO2:  99%    General: NAD Cardiovascular: RRR no murmurs rubs or gallops.  No JVD.  No lower  extremity edema. Respiratory: CTA B.  No wheezes, no crackles, no rhonchi.  Discharge Instructions   Discharge Instructions     Diet - low sodium heart healthy   Complete by: As directed    Discharge wound care:   Complete by: As directed    As above   Increase activity slowly   Complete by: As directed       Allergies as of 09/04/2021       Reactions   Ibuprofen Anaphylaxis, Other (See Comments)   Upper GI Bled   Nsaids Other (See Comments)   GI Bleed        Medication List     STOP taking these medications    cyclobenzaprine 10 MG tablet Commonly known as: FLEXERIL   oxyCODONE-acetaminophen 5-325 MG tablet Commonly known as: Percocet       TAKE these medications    acetaminophen 325 MG tablet Commonly known as: TYLENOL Take 2 tablets (650 mg total) by mouth every 6 (six) hours as needed for mild pain (or Fever >/= 101).   amLODipine 5 MG tablet Commonly known as: NORVASC Take 5 mg by mouth 2 (two) times daily.   amoxicillin-clavulanate 875-125 MG tablet Commonly known as: AUGMENTIN Take 1 tablet by mouth every 12 (twelve) hours for 3 days.   buPROPion 300 MG 24 hr tablet Commonly known as:  WELLBUTRIN XL Take 300 mg by mouth every morning.   cephALEXin 500 MG capsule Commonly known as: KEFLEX Take 2,000 mg by mouth See admin instructions. Prior to dental procedure   dasatinib 70 MG tablet Commonly known as: SPRYCEL Take 1 tablet (70 mg total) by mouth daily. Start taking on: September 08, 2021 What changed:  how much to take how to take this when to take this These instructions start on September 08, 2021. If you are unsure what to do until then, ask your doctor or other care provider.   fluticasone 50 MCG/ACT nasal spray Commonly known as: FLONASE Place 1 spray into both nostrils daily as needed for allergies.   glucose blood test strip OneTouch Ultra Blue Test Strip  USE TO TEST BLOOD SUGAR ONCE DAILY   Lidocaine 4 % Ptch Apply 1  patch topically daily as needed (pain).   LORazepam 0.5 MG tablet Commonly known as: ATIVAN Take 0.5 mg by mouth at bedtime.   metFORMIN 500 MG 24 hr tablet Commonly known as: GLUCOPHAGE-XR Take 500 mg by mouth 2 (two) times daily.   moxifloxacin 0.5 % ophthalmic solution Commonly known as: VIGAMOX Place 1 drop into the right eye See admin instructions. Place 1 drop in right eye QID the day before, day of, and day after treatment.   ondansetron 4 MG tablet Commonly known as: Zofran Take 1 tablet (4 mg total) by mouth every 8 (eight) hours as needed for nausea or vomiting.   polyethylene glycol 17 g packet Commonly known as: MIRALAX / GLYCOLAX Take 17 g by mouth daily as needed for moderate constipation.   PRESCRIPTION MEDICATION Inject 1 Dose as directed every 8 (eight) weeks. Gets injections in right eye at dr's office every 8 weeks   rosuvastatin 20 MG tablet Commonly known as: CRESTOR Take 20 mg by mouth at bedtime.   sildenafil 50 MG tablet Commonly known as: VIAGRA Take 50 mg by mouth daily as needed for erectile dysfunction.   testosterone cypionate 200 MG/ML injection Commonly known as: DEPOTESTOSTERONE CYPIONATE Inject 200 mg into the muscle every 21 ( twenty-one) days.   traMADol 50 MG tablet Commonly known as: ULTRAM Take 0.5 tablets (25 mg total) by mouth every 8 (eight) hours as needed for severe pain.   Trulicity 1.5 WG/6.6ZL Sopn Generic drug: Dulaglutide Inject 1.5 mg into the muscle every Monday.   vitamin B-12 1000 MCG tablet Commonly known as: CYANOCOBALAMIN Take 1,000 mcg by mouth daily.   Xarelto 10 MG Tabs tablet Generic drug: rivaroxaban TAKE 1 TABLET BY MOUTH EVERY DAY What changed: how much to take               Discharge Care Instructions  (From admission, onward)           Start     Ordered   09/04/21 0000  Discharge wound care:       Comments: As above   09/04/21 1637           Allergies  Allergen Reactions    Ibuprofen Anaphylaxis and Other (See Comments)    Upper GI Bled   Nsaids Other (See Comments)    GI Bleed    Follow-up Information     Leanna Battles, MD. Schedule an appointment as soon as possible for a visit in 2 week(s).   Specialty: Internal Medicine Why: f/u in 1-2 weeks. Contact information: [address] [place] [address] [phone]         Justice Britain, MD Follow  up.   Specialty: Orthopedic Surgery Why: Follow-up as scheduled. Contact information: [address] [place] [address] [phone]                  The results of significant diagnostics from this hospitalization (including imaging, microbiology, ancillary and laboratory) are listed below for reference.    Significant Diagnostic Studies: CT HEAD WO CONTRAST (5MM)  Result Date: 09/02/2021 CLINICAL DATA:  Mental status change EXAM: CT HEAD WITHOUT CONTRAST TECHNIQUE: Contiguous axial images were obtained from the base of the skull through the vertex without intravenous contrast. COMPARISON:  05/28/2021 FINDINGS: Brain: No evidence of acute infarction, hemorrhage, hydrocephalus, extra-axial collection or mass lesion/mass effect. Low-density in the cerebral white matter attributed to chronic small vessel ischemia. Vascular: Vertebrobasilar calcification and tortuosity often related to chronic hypertension. No hyperdense vessel. Skull: Normal. Negative for fracture or focal lesion. Sinuses/Orbits: Minor left mastoid opacification with negative nasopharynx. IMPRESSION: No acute or interval finding. Electronically Signed   By: Jorje Guild M.D.   On: 09/02/2021 05:24   DG Chest Port 1 View  Result Date: 09/02/2021 CLINICAL DATA:  Altered mental status. History of right shoulder surgery on September 14th. EXAM: PORTABLE CHEST 1 VIEW COMPARISON:  April 07, 2021 FINDINGS: Low lung volumes are seen with mild right infrahilar linear scarring and/or atelectasis. There is no evidence  of acute infiltrate, pleural effusion or pneumothorax. The heart size and mediastinal contours are within normal limits. A right shoulder replacement is seen without evidence of surrounding lucency to suggest the presence of hardware loosening or infection. IMPRESSION: 1. No acute cardiopulmonary process. 2. Right shoulder replacement without evidence of hardware complication. Electronically Signed   By: Virgina Norfolk M.D.   On: 09/02/2021 03:30    Microbiology: Recent Results (from the past 240 hour(s))  Urine Culture     Status: None   Collection Time: 09/02/21  3:10 AM   Specimen: In/Out Cath Urine  Result Value Ref Range Status   Specimen Description   Final    IN/OUT CATH URINE Performed at [facility], [place] [address] [place], [place] [address]    Special Requests   Final    NONE Performed at [facility], [place] [address], [place], [place] [address]    Culture   Final    NO GROWTH Performed at [facility], [place] [address], [place], [place] [address]    Report Status 09/03/2021 FINAL  Final  Blood Culture (routine x 2)     Status: None (Preliminary result)   Collection Time: 09/02/21  3:10 AM   Specimen: Left Antecubital; Blood  Result Value Ref Range Status   Specimen Description   Final    LEFT ANTECUBITAL Performed at [place] [address], [place], [place] [address]    Special Requests   Final    BOTTLES DRAWN AEROBIC AND ANAEROBIC Blood Culture results may not be optimal due to an inadequate volume of blood received in culture bottles Performed at [place] [address] [place], [place] [address]    Culture   Final    NO GROWTH 2 DAYS Performed at [place] [address], [place], [place] [address]    Report Status PENDING  Incomplete  Blood Culture (routine x 2)     Status: None (Preliminary result)   Collection Time: 09/02/21  3:10 AM   Specimen: BLOOD  RIGHT HAND  Result Value Ref Range  Status   Specimen Description   Final    BLOOD RIGHT HAND Performed at [place] [address], [place], [place] [address]    Special Requests   Final    BOTTLES DRAWN AEROBIC ONLY Blood Culture adequate volume Performed at [place] [address], [place], [place] [address]    Culture   Final    NO GROWTH 2 DAYS Performed at [place] [address], [place], [place] [address]    Report Status PENDING  Incomplete  Resp Panel by RT-PCR (Flu A&B, Covid) Nasopharyngeal Swab     Status: None   Collection Time: 09/02/21  3:10 AM   Specimen: Nasopharyngeal Swab; Nasopharyngeal(NP) swabs in vial transport medium  Result Value Ref Range Status   SARS Coronavirus 2 by RT PCR NEGATIVE NEGATIVE Final    Comment: (NOTE) SARS-CoV-2 target nucleic acids are NOT DETECTED.  The SARS-CoV-2 RNA is generally detectable in upper respiratory specimens during the acute phase of infection. The lowest concentration of SARS-CoV-2 viral copies this assay can detect is 138 copies/mL. A negative result does not preclude SARS-Cov-2 infection and should not be used as the sole basis for treatment or other patient management decisions. A negative result may occur with  improper specimen collection/handling, submission of specimen other than nasopharyngeal swab, presence of viral mutation(s) within the areas targeted by this assay, and inadequate number of viral copies(<138 copies/mL). A negative result must be combined with clinical observations, patient history, and epidemiological information. The expected result is Negative.  Fact Sheet for Patients:  EntrepreneurPulse.com.au  Fact Sheet for Healthcare Providers:  IncredibleEmployment.be  This test is no t yet approved or cleared by the [place] FDA and  has been authorized for detection and/or diagnosis of SARS-CoV-2  by FDA under an Emergency Use Authorization (EUA). This EUA will remain  in effect (meaning this test can be used) for the duration of the COVID-19 declaration under Section 564(b)(1) of the Act, 21 U.S.C.section 360bbb-3(b)(1), unless the authorization is terminated  or revoked sooner.       Influenza A by PCR NEGATIVE NEGATIVE Final   Influenza B by PCR NEGATIVE NEGATIVE Final    Comment: (NOTE) The Xpert Xpress SARS-CoV-2/FLU/RSV plus assay is intended as an aid in the diagnosis of influenza from Nasopharyngeal swab specimens and should not be used as a sole basis for treatment. Nasal washings and aspirates are unacceptable for Xpert Xpress SARS-CoV-2/FLU/RSV testing.  Fact Sheet for Patients: EntrepreneurPulse.com.au  Fact Sheet for Healthcare Providers: IncredibleEmployment.be  This test is not yet approved or cleared by the [place] FDA and has been authorized for detection and/or diagnosis of SARS-CoV-2 by FDA under an Emergency Use Authorization (EUA). This EUA will remain in effect (meaning this test can be used) for the duration of the COVID-19 declaration under Section 564(b)(1) of the Act, 21 U.S.C. section 360bbb-3(b)(1), unless the authorization is terminated or revoked.  Performed at [facility], [place] [address] [place], [place] [address]      Labs: Basic Metabolic Panel: Recent Labs  Lab 09/02/21 0310 09/03/21 0531 09/04/21 0540  NA 134* 136 134*  K 4.2 4.1 3.8  CL 101 103 102  CO2 25 23 24   GLUCOSE 166* 132* 148*  BUN 15 13 13   CREATININE 1.04 0.84 0.94  CALCIUM 10.0 10.0 9.4  MG  --   --  2.2   Liver Function Tests: Recent Labs  Lab 09/02/21 0310  AST  23  ALT 17  ALKPHOS 77  BILITOT 1.0  PROT 6.8  ALBUMIN 3.8   No results for input(s): LIPASE, AMYLASE in the last 168 hours. Recent Labs  Lab 09/02/21 1500  AMMONIA 25   CBC: Recent Labs  Lab 09/02/21 0310  09/03/21 0531 09/04/21 0540  WBC 9.3 9.4 10.8*  NEUTROABS 6.7  --  8.4*  HGB 14.4 14.3 13.9  HCT 44.1 42.7 42.4  MCV 93.8 92.0 92.8  PLT 204 187 245   Cardiac Enzymes: No results for input(s): CKTOTAL, CKMB, CKMBINDEX, TROPONINI in the last 168 hours. BNP: BNP (last 3 results) No results for input(s): BNP in the last 8760 hours.  ProBNP (last 3 results) No results for input(s): PROBNP in the last 8760 hours.  CBG: Recent Labs  Lab 09/03/21 1115 09/03/21 1629 09/03/21 2119 09/04/21 0712 09/04/21 1129  GLUCAP 175* 108* 157* 138* 158*       Signed:  Irine Seal MD.  [facility] 09/04/2021, 4:38 PM

## 2021-09-05 ENCOUNTER — Encounter (HOSPITAL_COMMUNITY): Payer: Self-pay | Admitting: Orthopedic Surgery

## 2021-09-05 ENCOUNTER — Telehealth: Payer: Self-pay | Admitting: *Deleted

## 2021-09-05 ENCOUNTER — Encounter: Payer: Self-pay | Admitting: Hematology & Oncology

## 2021-09-05 NOTE — Telephone Encounter (Signed)
Received a call from [place], patients wife.  When patient was discharged from the hospital, the physician stated to restart the Sprycel on 09/08/2021.  Wife wanted to know if this was ok with Dr [name].  Dr [name] agrees with the start date of the Sprycel. Wife made aware and is appreciative of call

## 2021-09-07 LAB — CULTURE, BLOOD (ROUTINE X 2)
Culture: NO GROWTH
Culture: NO GROWTH
Special Requests: ADEQUATE

## 2021-09-12 ENCOUNTER — Other Ambulatory Visit: Payer: Self-pay

## 2021-09-12 ENCOUNTER — Encounter: Payer: Self-pay | Admitting: Hematology & Oncology

## 2021-09-12 ENCOUNTER — Inpatient Hospital Stay (HOSPITAL_BASED_OUTPATIENT_CLINIC_OR_DEPARTMENT_OTHER): Payer: Medicare Other | Admitting: Hematology & Oncology

## 2021-09-12 ENCOUNTER — Inpatient Hospital Stay: Payer: Medicare Other | Attending: Hematology & Oncology

## 2021-09-12 VITALS — BP 114/54 | HR 85 | Temp 98.4°F | Resp 18 | Ht 66.0 in | Wt 167.0 lb

## 2021-09-12 DIAGNOSIS — D751 Secondary polycythemia: Secondary | ICD-10-CM | POA: Insufficient documentation

## 2021-09-12 DIAGNOSIS — C9210 Chronic myeloid leukemia, BCR/ABL-positive, not having achieved remission: Secondary | ICD-10-CM | POA: Insufficient documentation

## 2021-09-12 LAB — CBC WITH DIFFERENTIAL (CANCER CENTER ONLY)
Abs Immature Granulocytes: 0.05 10*3/uL (ref 0.00–0.07)
Basophils Absolute: 0 10*3/uL (ref 0.0–0.1)
Basophils Relative: 0 %
Eosinophils Absolute: 0.1 10*3/uL (ref 0.0–0.5)
Eosinophils Relative: 2 %
HCT: 42.4 % (ref 39.0–52.0)
Hemoglobin: 13.7 g/dL (ref 13.0–17.0)
Immature Granulocytes: 1 %
Lymphocytes Relative: 11 %
Lymphs Abs: 0.8 10*3/uL (ref 0.7–4.0)
MCH: 30.8 pg (ref 26.0–34.0)
MCHC: 32.3 g/dL (ref 30.0–36.0)
MCV: 95.3 fL (ref 80.0–100.0)
Monocytes Absolute: 0.6 10*3/uL (ref 0.1–1.0)
Monocytes Relative: 9 %
Neutro Abs: 5.3 10*3/uL (ref 1.7–7.7)
Neutrophils Relative %: 77 %
Platelet Count: 542 10*3/uL — ABNORMAL HIGH (ref 150–400)
RBC: 4.45 MIL/uL (ref 4.22–5.81)
RDW: 17.6 % — ABNORMAL HIGH (ref 11.5–15.5)
WBC Count: 6.9 10*3/uL (ref 4.0–10.5)
nRBC: 0 % (ref 0.0–0.2)

## 2021-09-12 LAB — CMP (CANCER CENTER ONLY)
ALT: 18 U/L (ref 0–44)
AST: 21 U/L (ref 15–41)
Albumin: 3.7 g/dL (ref 3.5–5.0)
Alkaline Phosphatase: 113 U/L (ref 38–126)
Anion gap: 8 (ref 5–15)
BUN: 18 mg/dL (ref 8–23)
CO2: 28 mmol/L (ref 22–32)
Calcium: 10.7 mg/dL — ABNORMAL HIGH (ref 8.9–10.3)
Chloride: 103 mmol/L (ref 98–111)
Creatinine: 1.18 mg/dL (ref 0.61–1.24)
GFR, Estimated: >60 mL/min (ref >60)
Glucose, Bld: 187 mg/dL — ABNORMAL HIGH (ref 70–99)
Potassium: 4.4 mmol/L (ref 3.5–5.1)
Sodium: 139 mmol/L (ref 135–145)
Total Bilirubin: 0.5 mg/dL (ref 0.3–1.2)
Total Protein: 6.8 g/dL (ref 6.5–8.1)

## 2021-09-12 LAB — FERRITIN: Ferritin: 181 ng/mL (ref 24–336)

## 2021-09-12 LAB — IRON AND TIBC
Iron: 50 ug/dL (ref 42–163)
Saturation Ratios: 18 % — ABNORMAL LOW (ref 20–55)
TIBC: 275 ug/dL (ref 202–409)
UIBC: 225 ug/dL (ref 117–376)

## 2021-09-12 LAB — SAVE SMEAR(SSMR), FOR PROVIDER SLIDE REVIEW

## 2021-09-12 NOTE — Progress Notes (Signed)
Hematology and Oncology Follow Up Visit  James [name] [id number] 1946-01-04 75 y.o. 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 IV iron as indicated - last received in March 2022        Interim History:  James [name] is here today for follow-up.  Unfortunately, he was in the hospital recently.  He appeared to have some type of reaction to anesthesia or 2 Percocet.  He has some confusion.  There may have been some aspiration.  He has studies and all studies were negative.  He is lost quite a bit of weight.  He is down 167 pounds.  Para he recently had right shoulder surgery.  This is a complete repair.  At least, his CML is not doing bad at all.  His last BCR/ABL was down to 0.03%.  As such, I would say he is in a major molecular remission.  He has had no problems with bowels or bladder.  He has had no rashes.  There is been no leg swelling.  He has had no obvious bleeding.  He is on Xarelto.  Overall, I would say his performance status is ECOG 2.    Medications:  Allergies as of 09/12/2021       Reactions   Ibuprofen Anaphylaxis, Other (See Comments)   Upper GI Bled   Nsaids Other (See Comments)   GI Bleed        Medication List        Accurate as of September 12, 2021  9:07 AM. If you have any questions, ask your nurse or doctor.          STOP taking these medications    ondansetron 4 MG tablet Commonly known as: Zofran Stopped by: [name], MD   sildenafil 50 MG tablet Commonly known as: VIAGRA Stopped by: [name], MD   traMADol 50 MG tablet Commonly known as: ULTRAM Stopped by: [name], MD       TAKE these medications    acetaminophen 325 MG tablet Commonly known as: TYLENOL Take 2 tablets  (650 mg total) by mouth every 6 (six) hours as needed for mild pain (or Fever >/= 101).   amLODipine 5 MG tablet Commonly known as: NORVASC Take 5 mg by mouth 2 (two) times daily.   buPROPion 300 MG 24 hr tablet Commonly known as: WELLBUTRIN XL Take 300 mg by mouth every morning.   cephALEXin 500 MG capsule Commonly known as: KEFLEX Take 2,000 mg by mouth See admin instructions. Prior to dental procedure   dasatinib 70 MG tablet Commonly known as: SPRYCEL Take 1 tablet (70 mg total) by mouth daily.   fluticasone 50 MCG/ACT nasal spray Commonly known as: FLONASE Place 1 spray into both nostrils daily as needed for allergies.   glucose blood test strip OneTouch Ultra Blue Test Strip  USE TO TEST BLOOD SUGAR ONCE DAILY   Lidocaine 4 % Ptch Apply 1 patch topically daily as needed (pain).   LORazepam 0.5 MG tablet Commonly known as: ATIVAN Take 0.5 mg by mouth at bedtime.   metFORMIN 500 MG 24 hr tablet Commonly known as: GLUCOPHAGE-XR Take 500 mg by mouth 2 (two) times daily.   moxifloxacin 0.5 % ophthalmic solution Commonly known  as: VIGAMOX Place 1 drop into the right eye See admin instructions. Place 1 drop in right eye QID the day before, day of, and day after treatment.   polyethylene glycol 17 g packet Commonly known as: MIRALAX / GLYCOLAX Take 17 g by mouth daily as needed for moderate constipation.   PRESCRIPTION MEDICATION Inject 1 Dose as directed every 8 (eight) weeks. Gets injections in right eye at dr's office every 8 weeks   rosuvastatin 20 MG tablet Commonly known as: CRESTOR Take 20 mg by mouth at bedtime.   testosterone cypionate 200 MG/ML injection Commonly known as: DEPOTESTOSTERONE CYPIONATE Inject 200 mg into the muscle every 21 ( twenty-one) days.   Trulicity 1.5 OH/7.2BM Sopn Generic drug: Dulaglutide Inject 1.5 mg into the muscle every Monday.   vitamin B-12 1000 MCG tablet Commonly known as: CYANOCOBALAMIN Take 1,000 mcg by mouth  daily.   Xarelto 10 MG Tabs tablet Generic drug: rivaroxaban TAKE 1 TABLET BY MOUTH EVERY DAY        Allergies:  Allergies  Allergen Reactions   Ibuprofen Anaphylaxis and Other (See Comments)    Upper GI Bled   Nsaids Other (See Comments)    GI Bleed    Past Medical History, Surgical history, Social history, and Family History were reviewed and updated.  Review of Systems: Review of Systems  Constitutional: Negative.   HENT: Negative.    Eyes: Negative.   Respiratory: Negative.    Cardiovascular: Negative.   Gastrointestinal: Negative.   Genitourinary: Negative.   Musculoskeletal: Negative.   Skin: Negative.   Neurological: Negative.   Endo/Heme/Allergies: Negative.   Psychiatric/Behavioral: Negative.     Physical Exam:  height is 5' 6"  (1.676 m) and weight is 167 lb (75.8 kg). His oral temperature is 98.4 F (36.9 C). His blood pressure is 114/54 (abnormal) and his pulse is 85. His respiration is 18 and oxygen saturation is 100%.   Wt Readings from Last 3 Encounters:  09/12/21 167 lb (75.8 kg)  09/02/21 168 lb (76.2 kg)  09/01/21 168 lb (76.2 kg)    Physical Exam Vitals reviewed.  HENT:     Head: Normocephalic and atraumatic.  Eyes:     Pupils: Pupils are equal, round, and reactive to light.  Cardiovascular:     Rate and Rhythm: Normal rate and regular rhythm.     Heart sounds: Normal heart sounds.  Pulmonary:     Effort: Pulmonary effort is normal.     Breath sounds: Normal breath sounds.  Abdominal:     General: Bowel sounds are normal.     Palpations: Abdomen is soft.  Musculoskeletal:        General: No tenderness or deformity. Normal range of motion.     Cervical back: Normal range of motion.  Lymphadenopathy:     Cervical: No cervical adenopathy.  Skin:    General: Skin is warm and dry.     Findings: No erythema or rash.  Neurological:     Mental Status: He is alert and oriented to person, place, and time.  Psychiatric:        Behavior:  Behavior normal.        Thought Content: Thought content normal.        Judgment: Judgment normal.     Lab Results  Component Value Date   WBC 6.9 09/12/2021   HGB 13.7 09/12/2021   HCT 42.4 09/12/2021   MCV 95.3 09/12/2021   PLT 542 (H) 09/12/2021   Lab Results  Component  Value Date   FERRITIN 76 05/26/2021   IRON 40 (L) 05/26/2021   TIBC 356 05/26/2021   UIBC 316 05/26/2021   IRONPCTSAT 11 (L) 05/26/2021   Lab Results  Component Value Date   RETICCTPCT 0.8 03/24/2021   RBC 4.45 09/12/2021   RETICCTABS 94.0 08/30/2011   No results found for: Nils Pyle [facility] Lab Results  Component Value Date   IGGSERUM 828 10/11/2016   IGA 177 06/22/2016   No results found for: Odetta Pink, SPEI   Chemistry      Component Value Date/Time   NA 139 09/12/2021 0831   NA 137 10/29/2017 1307   NA 134 (L) 05/10/2017 0755   K 4.4 09/12/2021 0831   K 3.6 10/29/2017 1307   K 4.2 05/10/2017 0755   CL 103 09/12/2021 0831   CL 101 10/29/2017 1307   CO2 28 09/12/2021 0831   CO2 25 10/29/2017 1307   CO2 20 (L) 05/10/2017 0755   BUN 18 09/12/2021 0831   BUN 14 10/29/2017 1307   BUN 21.0 05/10/2017 0755   CREATININE 1.18 09/12/2021 0831   CREATININE 1.4 (H) 10/29/2017 1307   CREATININE 1.3 05/10/2017 0755      Component Value Date/Time   CALCIUM 10.7 (H) 09/12/2021 0831   CALCIUM 10.3 10/29/2017 1307   CALCIUM 10.3 05/10/2017 0755   ALKPHOS 113 09/12/2021 0831   ALKPHOS 77 10/29/2017 1307   ALKPHOS 97 05/10/2017 0755   AST 21 09/12/2021 0831   AST 25 05/10/2017 0755   ALT 18 09/12/2021 0831   ALT 27 10/29/2017 1307   ALT 26 05/10/2017 0755   BILITOT 0.5 09/12/2021 0831   BILITOT 0.70 05/10/2017 0755     Impression and Plan: James [name] is 75 year old gentleman with a long history of polycythemia. He is JAK2 negative.  He has transformed over to [facility]  He is on Sprycel.  He is responding quite nicely.   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  I noted that his platelet count was on the high side.  This certainly could be reflective of his surgery.  Also could be reflective of iron deficiency.  We will see what his iron studies look like.  From my point of view, I will see any other problems that we have to worry about right now.  I know that he has this polycythemia but this really is not an issue at this point.  We will go ahead and plan to get him back to see [place] in about 6 weeks.  I think this would be a reasonable amount of time to see him back in follow-up.  [name], MD 9/26/20229:07 AM

## 2021-09-13 ENCOUNTER — Telehealth: Payer: Self-pay

## 2021-09-13 NOTE — Telephone Encounter (Signed)
Patients wife called to informed us patient was in yesterday and couldn't remember if amlodipine was discontinued or not, and she talked with his PCP this morning and it was. Requested it be taken off his list. Removed it from patients list.

## 2021-09-22 ENCOUNTER — Telehealth: Payer: Self-pay

## 2021-09-22 LAB — BCR/ABL

## 2021-09-22 NOTE — Telephone Encounter (Signed)
-----   Message from [name], MD sent at 09/22/2021  8:08 AM EDT ----- Call - the CML is in remission!!  Laurey Arrow

## 2021-09-22 NOTE — Telephone Encounter (Signed)
Called and informed patient of lab results, patient verbalized understanding and denies any questions or concerns at this time.   

## 2021-10-14 ENCOUNTER — Other Ambulatory Visit: Payer: Self-pay | Admitting: *Deleted

## 2021-10-14 DIAGNOSIS — C9210 Chronic myeloid leukemia, BCR/ABL-positive, not having achieved remission: Secondary | ICD-10-CM

## 2021-10-14 MED ORDER — DASATINIB 70 MG PO TABS: 70.0000 mg | ORAL_TABLET | Freq: Every day | ORAL | 3 refills | Status: DC

## 2021-10-24 ENCOUNTER — Inpatient Hospital Stay: Payer: Medicare Other

## 2021-10-24 ENCOUNTER — Inpatient Hospital Stay: Payer: Medicare Other | Attending: Hematology & Oncology | Admitting: Hematology & Oncology

## 2021-10-26 ENCOUNTER — Ambulatory Visit
Admission: RE | Admit: 2021-10-26 | Discharge: 2021-10-26 | Disposition: A | Discharge status: Home / Self Care | Payer: Medicare Other | Source: Ambulatory Visit | Attending: Family Medicine | Admitting: Family Medicine

## 2021-10-26 ENCOUNTER — Ambulatory Visit (INDEPENDENT_AMBULATORY_CARE_PROVIDER_SITE_OTHER): Payer: Medicare Other | Admitting: Family Medicine

## 2021-10-26 ENCOUNTER — Other Ambulatory Visit: Payer: Self-pay | Admitting: Family Medicine

## 2021-10-26 VITALS — BP 150/80 | Ht 66.0 in | Wt 165.0 lb

## 2021-10-26 DIAGNOSIS — M25551 Pain in right hip: Secondary | ICD-10-CM

## 2021-10-26 MED ORDER — HYDROCODONE-ACETAMINOPHEN 5-325 MG PO TABS: 1.0000 | ORAL_TABLET | ORAL | 0 refills | Status: DC | PRN

## 2021-10-26 NOTE — Patient Instructions (Addendum)
Get x-rays of your hip after you leave today - we will call you with the results and next steps. Take norco (hydrocodone) as needed for severe pain - don't take tylenol with this. Icing 15 minutes at a time up to every hour to keep the swelling down as much as possible. Activities as tolerated as we discussed. Let me know how you're doing in 1 week and I want to see you back in 2 weeks.

## 2021-10-27 ENCOUNTER — Encounter: Payer: Self-pay | Admitting: Family Medicine

## 2021-10-27 NOTE — Progress Notes (Signed)
PCP: Leanna Battles, MD  Subjective:   HPI: Patient is a 75 y.o. male here for right hip pain.  Patient reports this morning he was coming down the stairs in the dark and miscounted how many he had already come down. Had one more step than he thought, fell down directly onto posterior right hip. Able to bear weight after this but over the course of a couple hours pain, swelling worsened making it hard for him to sit down. No pain when he's lying on his left side. Able to walk in here with walker today. No loss of consciousness. Used ice for about a half hour, took tylenol.  Past Medical History:  Diagnosis Date   Anxiety    Arthritis    Clotting disorder ([place])    CML (chronic myelocytic leukemia) ([place]) 12/16/2020   Depression    DM (diabetes mellitus) ([facility])    GERD (gastroesophageal reflux disease)    Hiatal hernia    History of shingles 04/03/2014   HTN (hypertension)    Hyperlipidemia    Insomnia    resolved by using CPAP   Iron deficiency anemia due to chronic blood loss 11/20/2016   Macular degeneration    Rt eye   Nocturia more than twice per night 11/11/2014   For 6 month, 5 nocturias a night.    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)    History   Portal vein thrombosis    Rhinitis    Situational depression    Sleep apnea    uses CPAP every night   Tubular adenoma 12/10/2015   6 cecum polyps    Current Outpatient Medications on File Prior to Visit  Medication Sig Dispense Refill   buPROPion (WELLBUTRIN XL) 300 MG 24 hr tablet Take 300 mg by mouth every morning.     cephALEXin (KEFLEX) 500 MG capsule Take 2,000 mg by mouth See admin instructions. Prior to dental procedure (Patient not taking: Reported on 09/12/2021)     dasatinib (SPRYCEL) 70 MG tablet Take 1 tablet (70 mg total) by mouth daily. 30 tablet 3   fluticasone (FLONASE) 50 MCG/ACT nasal spray Place 1 spray into both nostrils daily as needed for allergies.     glucose blood test strip OneTouch Ultra  Blue Test Strip  USE TO TEST BLOOD SUGAR ONCE DAILY     Lidocaine 4 % PTCH Apply 1 patch topically daily as needed (pain).     LORazepam (ATIVAN) 0.5 MG tablet Take 0.5 mg by mouth at bedtime.  3   metFORMIN (GLUCOPHAGE-XR) 500 MG 24 hr tablet Take 500 mg by mouth 2 (two) times daily.     moxifloxacin (VIGAMOX) 0.5 % ophthalmic solution Place 1 drop into the right eye See admin instructions. Place 1 drop in right eye QID the day before, day of, and day after treatment.  5   polyethylene glycol (MIRALAX / GLYCOLAX) 17 g packet Take 17 g by mouth daily as needed for moderate constipation.     PRESCRIPTION MEDICATION Inject 1 Dose as directed every 8 (eight) weeks. Gets injections in right eye at dr's office every 8 weeks     rosuvastatin (CRESTOR) 20 MG tablet Take 20 mg by mouth at bedtime.     testosterone cypionate (DEPOTESTOSTERONE CYPIONATE) 200 MG/ML injection Inject 200 mg into the muscle every 21 ( twenty-one) days.     TRULICITY 1.5 JX/9.1YN SOPN Inject 1.5 mg into the muscle every Monday.     vitamin B-12 (CYANOCOBALAMIN) 1000 MCG tablet Take  1,000 mcg by mouth daily.     XARELTO 10 MG TABS tablet TAKE 1 TABLET BY MOUTH EVERY DAY 30 tablet 11   No current facility-administered medications on file prior to visit.    Past Surgical History:  Procedure Laterality Date   CARDIAC CATHETERIZATION     greater 10 yrs ago, normal   CARPAL TUNNEL RELEASE     bilateral   COLONOSCOPY  06/2005   [facility] Laj[place]   ESOPHAGOGASTRODUODENOSCOPY (EGD) WITH PROPOFOL N/A 09/28/2016   Procedure: ESOPHAGOGASTRODUODENOSCOPY (EGD) WITH PROPOFOL;  Surgeon: Gatha Mayer, MD;  Location: [facility];  Service: Endoscopy;  Laterality: N/A;   IR GENERIC HISTORICAL  12/12/2016   IR US GUIDE VASC ACCESS RIGHT 12/12/2016 [name], MD [facility]   IR GENERIC HISTORICAL  12/12/2016   IR VENOGRAM HEPATIC W HEMODYNAMIC EVALUATION 12/12/2016 [name], MD [facility]   IR GENERIC HISTORICAL   12/12/2016   IR TRANSCATHETER BX 12/12/2016 [name], MD [facility]   REVERSE SHOULDER ARTHROPLASTY Right 09/01/2021   Procedure: REVERSE SHOULDER ARTHROPLASTY;  Surgeon: Justice Britain, MD;  Location: [facility];  Service: Orthopedics;  Laterality: Right;  160mn,   ROTATOR CUFF REPAIR     right   TENDON REPAIR     left arm   TONSILLECTOMY     TOTAL KNEE ARTHROPLASTY Right 2010   WISDOM TOOTH EXTRACTION      Allergies  Allergen Reactions   Ibuprofen Anaphylaxis and Other (See Comments)    Upper GI Bled   Nsaids Other (See Comments)    GI Bleed    BP (!) 150/80   Ht 5' 6"  (1.676 m)   Wt 165 lb (74.8 kg)   BMI 26.63 kg/m   No flowsheet data found.  No flowsheet data found.      Objective:  Physical Exam:  Gen: NAD, comfortable in exam room  Right hip: Large hematoma posterior to right greater trochanter.  No other deformity of hip or leg. Full passive motion of hip without pain.  Mild pain with hip abduction and 4/5 strength.  No pain hip flexion. Tenderness to palpation over hematoma.  No tenderness of femur, greater trochanter. NVI distally. Negative logroll Negative fadir.    Assessment & Plan:  1. Right hip injury - will obtain radiographs to ensure he did not sustain a hip fracture but exam, ability to bear weight reassuring that this is a hematoma.  Icing, norco, activities as tolerated.  Assuming x-rays are normal would see him back in 2 weeks but advise to let me know how he's progressing in 1 week.

## 2021-11-02 ENCOUNTER — Other Ambulatory Visit: Payer: Self-pay | Admitting: *Deleted

## 2021-11-02 MED ORDER — LIDOCAINE 5 % EX PTCH: 1.0000 | MEDICATED_PATCH | CUTANEOUS | 0 refills | Status: DC

## 2021-11-08 ENCOUNTER — Other Ambulatory Visit (HOSPITAL_COMMUNITY): Payer: Self-pay

## 2021-11-08 ENCOUNTER — Encounter: Payer: Self-pay | Admitting: Hematology & Oncology

## 2021-11-09 ENCOUNTER — Encounter: Payer: Self-pay | Admitting: Family Medicine

## 2021-11-09 ENCOUNTER — Telehealth: Payer: Self-pay | Admitting: Pharmacy Technician

## 2021-11-09 ENCOUNTER — Ambulatory Visit (INDEPENDENT_AMBULATORY_CARE_PROVIDER_SITE_OTHER): Payer: Medicare Other | Admitting: Family Medicine

## 2021-11-09 VITALS — BP 172/92 | Ht 66.0 in | Wt 165.0 lb

## 2021-11-09 DIAGNOSIS — M25551 Pain in right hip: Secondary | ICD-10-CM | POA: Diagnosis present

## 2021-11-09 NOTE — Progress Notes (Signed)
PCP: Leanna Battles, MD  Subjective:   HPI: James [name] is a 75 y.o. male here for right hip pain.  11/9: James [name] reports this morning he was coming down the stairs in the dark and miscounted how many he had already come down. Had one more step than he thought, fell down directly onto posterior right hip. Able to bear weight after this but over the course of a couple hours pain, swelling worsened making it hard for him to sit down. No pain when he's lying on his left side. Able to walk in here with walker today. No loss of consciousness. Used ice for about a half hour, took tylenol.  11/23: James [name] reports he feels much better. Able to put his socks and shoes on now. Still doesn't feel back to normal and has swelling posterior right hip but this has improved also. Not taking any medications any longer for this.  Past Medical History:  Diagnosis Date   Anxiety    Arthritis    Clotting disorder ([place])    CML (chronic myelocytic leukemia) ([place]) 12/16/2020   Depression    DM (diabetes mellitus) ([facility])    GERD (gastroesophageal reflux disease)    Hiatal hernia    History of shingles 04/03/2014   HTN (hypertension)    Hyperlipidemia    Insomnia    resolved by using CPAP   Iron deficiency anemia due to chronic blood loss 11/20/2016   Macular degeneration    Rt eye   Nocturia more than twice per night 11/11/2014   For 6 month, 5 nocturias a night.    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)    History   Portal vein thrombosis    Rhinitis    Situational depression    Sleep apnea    uses CPAP every night   Tubular adenoma 12/10/2015   6 cecum polyps    Current Outpatient Medications on File Prior to Visit  Medication Sig Dispense Refill   buPROPion (WELLBUTRIN XL) 300 MG 24 hr tablet Take 300 mg by mouth every morning.     cephALEXin (KEFLEX) 500 MG capsule Take 2,000 mg by mouth See admin instructions. Prior to dental procedure (James [name] not taking: Reported on 09/12/2021)      dasatinib (SPRYCEL) 70 MG tablet Take 1 tablet (70 mg total) by mouth daily. 30 tablet 3   fluticasone (FLONASE) 50 MCG/ACT nasal spray Place 1 spray into both nostrils daily as needed for allergies.     glucose blood test strip OneTouch Ultra Blue Test Strip  USE TO TEST BLOOD SUGAR ONCE DAILY     HYDROcodone-acetaminophen (NORCO) 5-325 MG tablet Take 1 tablet by mouth every 4 (four) hours as needed for moderate pain. 30 tablet 0   lidocaine (LIDODERM) 5 % Place 1 patch onto the skin daily. Remove & Discard patch within 12 hours or as directed by MD 30 patch 0   Lidocaine 4 % PTCH Apply 1 patch topically daily as needed (pain).     LORazepam (ATIVAN) 0.5 MG tablet Take 0.5 mg by mouth at bedtime.  3   metFORMIN (GLUCOPHAGE-XR) 500 MG 24 hr tablet Take 500 mg by mouth 2 (two) times daily.     moxifloxacin (VIGAMOX) 0.5 % ophthalmic solution Place 1 drop into the right eye See admin instructions. Place 1 drop in right eye QID the day before, day of, and day after treatment.  5   polyethylene glycol (MIRALAX / GLYCOLAX) 17 g packet Take 17 g by mouth daily as  needed for moderate constipation.     PRESCRIPTION MEDICATION Inject 1 Dose as directed every 8 (eight) weeks. Gets injections in right eye at dr's office every 8 weeks     rosuvastatin (CRESTOR) 20 MG tablet Take 20 mg by mouth at bedtime.     testosterone cypionate (DEPOTESTOSTERONE CYPIONATE) 200 MG/ML injection Inject 200 mg into the muscle every 21 ( twenty-one) days.     TRULICITY 1.5 DJ/2.4QA SOPN Inject 1.5 mg into the muscle every Monday.     vitamin B-12 (CYANOCOBALAMIN) 1000 MCG tablet Take 1,000 mcg by mouth daily.     XARELTO 10 MG TABS tablet TAKE 1 TABLET BY MOUTH EVERY DAY 30 tablet 11   No current facility-administered medications on file prior to visit.    Past Surgical History:  Procedure Laterality Date   CARDIAC CATHETERIZATION     greater 10 yrs ago, normal   CARPAL TUNNEL RELEASE     bilateral   COLONOSCOPY   06/2005   [facility] Dr [place]   ESOPHAGOGASTRODUODENOSCOPY (EGD) WITH PROPOFOL N/A 09/28/2016   Procedure: ESOPHAGOGASTRODUODENOSCOPY (EGD) WITH PROPOFOL;  Surgeon: Gatha Mayer, MD;  [facility];  Service: Endoscopy;  Laterality: N/A;   IR GENERIC HISTORICAL  12/12/2016   IR US GUIDE VASC ACCESS RIGHT 12/12/2016 [name], MD [facility]   IR GENERIC HISTORICAL  12/12/2016   IR VENOGRAM HEPATIC W HEMODYNAMIC EVALUATION 12/12/2016 [name], MD [facility]   IR GENERIC HISTORICAL  12/12/2016   IR TRANSCATHETER BX 12/12/2016 [name], MD [facility]   REVERSE SHOULDER ARTHROPLASTY Right 09/01/2021   Procedure: REVERSE SHOULDER ARTHROPLASTY;  Surgeon: Justice Britain, MD;  Location: [facility];  Service: Orthopedics;  Laterality: Right;  141mn,   ROTATOR CUFF REPAIR     right   TENDON REPAIR     left arm   TONSILLECTOMY     TOTAL KNEE ARTHROPLASTY Right 2010   WISDOM TOOTH EXTRACTION      Allergies  Allergen Reactions   Ibuprofen Anaphylaxis and Other (See Comments)    Upper GI Bled   Nsaids Other (See Comments)    GI Bleed    BP (!) 172/92   Ht 5' 6"  (1.676 m)   Wt 165 lb (74.8 kg)   BMI 26.63 kg/m   No flowsheet data found.  No flowsheet data found.      Objective:  Physical Exam:  Gen: NAD, comfortable in exam room  Right hip: Hematoma improved posterior to right greater trochanter.  No other deformity. FROM with 5/5 strength, much improved. Mild tenderness to palpation over hematoma only. NVI distally. Negative logroll Negative faber, fadir, and piriformis stretches.   Assessment & Plan:  1. Right hip injury - radiographs negative.  Clinically much improved.  Resume his home exercises.  Icing, tylenol if needed.  F/u prn.

## 2021-12-05 NOTE — Telephone Encounter (Signed)
Oral Oncology [name] Advocate Encounter  [name] stopped by office with completed renewal application for [employer] in an effort to reduce [name]'s out of pocket expense for Sprycel to $0.    Application completed and faxed to [phone] on 11/09/21.   [facility] phone number for follow up is [phone].  This encounter will be updated until final determination.  James [name] [place] Phone [phone] Fax [phone]

## 2021-12-05 NOTE — Telephone Encounter (Signed)
[name] Encounter  Received notification from [place] Patient [facility] that patient has been successfully enrolled into their program to receive Sprycel from the manufacturer at $0 out of pocket until 12/17/22.    I will call the patient to let him know of the approval and about renewing for the next year.   Specialty Pharmacy that will dispense medication is [facility].  Patient knows to call the office with questions or concerns.   Oral Oncology Clinic will continue to follow.  [place] Patient [place] Phone [phone] Fax [phone] 12/05/2021 10:20 AM

## 2021-12-06 MED ORDER — DASATINIB 70 MG PO TABS: 70.0000 mg | ORAL_TABLET | Freq: Every day | ORAL | 3 refills | Status: DC

## 2021-12-06 NOTE — Addendum Note (Signed)
Addended by: Darl Pikes on: 12/06/2021 03:07 PM   Modules accepted: Orders

## 2021-12-30 ENCOUNTER — Encounter: Payer: Self-pay | Admitting: Hematology & Oncology

## 2021-12-30 ENCOUNTER — Other Ambulatory Visit (HOSPITAL_COMMUNITY): Payer: Self-pay

## 2022-04-20 ENCOUNTER — Telehealth: Payer: Self-pay | Admitting: Adult Health

## 2022-04-20 NOTE — Telephone Encounter (Signed)
Called pt to r/s 6/7 appt (Megan out), pt stated he would call us back to r/s when he got home to his calendar. ?

## 2022-04-23 ENCOUNTER — Ambulatory Visit (INDEPENDENT_AMBULATORY_CARE_PROVIDER_SITE_OTHER): Payer: Medicare Other

## 2022-04-23 ENCOUNTER — Ambulatory Visit (HOSPITAL_COMMUNITY)
Admission: EM | Admit: 2022-04-23 | Discharge: 2022-04-23 | Disposition: A | Discharge status: Home / Self Care | Payer: Medicare Other

## 2022-04-23 ENCOUNTER — Encounter (HOSPITAL_COMMUNITY): Payer: Self-pay

## 2022-04-23 DIAGNOSIS — S32020A Wedge compression fracture of second lumbar vertebra, initial encounter for closed fracture: Secondary | ICD-10-CM

## 2022-04-23 DIAGNOSIS — M545 Low back pain, unspecified: Secondary | ICD-10-CM | POA: Diagnosis not present

## 2022-04-23 MED ORDER — TRAMADOL HCL 50 MG PO TABS: 50.0000 mg | ORAL_TABLET | Freq: Two times a day (BID) | ORAL | 0 refills | Status: AC | PRN

## 2022-04-23 NOTE — Discharge Instructions (Addendum)
Continue to take Tylenol as needed for mild to moderate pain ?Tramadol was prescribed for severe pain/take as directed ?Follow-up with PCP for possible referral to neurology ?Return or go to ED if you develop any new or worsening of your symptoms ?

## 2022-04-23 NOTE — ED Triage Notes (Signed)
Pt states that he has right mid to lower back pain. No UTI sx. Pt has leukemia. ?

## 2022-04-23 NOTE — ED Provider Notes (Signed)
?[place] ? ? ?[id number] ?04/23/22 Arrival Time: 2248 ? ? ?Chief Complaint  ?Patient presents with  ? Back Pain  ? ? ? ?SUBJECTIVE: ?History from: patient and family. ? ?James [name]. is a 76 y.o. male with history of leukemia presented to the urgent care with a complaint of right mid  lower back pain.  Denies any precipitating event.  He localizes the pain to the right lower back.  He describes the pain as constant and achy.  Has not tried any medication for relief today.  His symptoms are made worse with ROM.  Pressing symptoms in the past.  Denies chills, fever, nausea, vomiting or diarrhea. ? ? ?ROS: As per HPI.  All other pertinent ROS negative.    ? ? ?Past Medical History:  ?Diagnosis Date  ? Anxiety   ? Arthritis   ? Clotting disorder ([place])   ? 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 Depression   ? DM (diabetes mellitus) ([place])   ? GERD (gastroesophageal reflux disease)   ? Hiatal hernia   ? History of shingles 04/03/2014  ? HTN (hypertension)   ? Hyperlipidemia   ? Insomnia   ? resolved by using CPAP  ? Iron deficiency anemia due to chronic blood loss 11/20/2016  ? Macular degeneration   ? Rt eye  ? Nocturia more than twice per night 11/11/2014  ? For 6 month, 5 nocturias a night.   ? Obesity   ? Polycythemia vera(238.4)   ? History  ? Portal vein thrombosis   ? Rhinitis   ? Situational depression   ? Sleep apnea   ? uses CPAP every night  ? Tubular adenoma 12/10/2015  ? 6 cecum polyps  ? ?Past Surgical History:  ?Procedure Laterality Date  ? CARDIAC CATHETERIZATION    ? greater 10 yrs ago, normal  ? CARPAL TUNNEL RELEASE    ? bilateral  ? COLONOSCOPY  06/2005  ? [facility]  ? ESOPHAGOGASTRODUODENOSCOPY (EGD) WITH PROPOFOL N/A 09/28/2016  ? Procedure: ESOPHAGOGASTRODUODENOSCOPY (EGD) WITH PROPOFOL;  Surgeon: Gatha Mayer, MD;  Location: [facility];  Service: Endoscopy;  Laterality: N/A;  ? IR GENERIC HISTORICAL  12/12/2016  ? IR US GUIDE VASC ACCESS RIGHT 12/12/2016  [name], MD [facility]  ? IR GENERIC HISTORICAL  12/12/2016  ? IR VENOGRAM HEPATIC W HEMODYNAMIC EVALUATION 12/12/2016 [name], MD [facility]  ? IR GENERIC HISTORICAL  12/12/2016  ? IR TRANSCATHETER BX 12/12/2016 [name], MD [facility]  ? REVERSE SHOULDER ARTHROPLASTY Right 09/01/2021  ? Procedure: REVERSE SHOULDER ARTHROPLASTY;  Surgeon: Justice Britain, MD;  Location: [facility];  Service: Orthopedics;  Laterality: Right;  135mn,  ? ROTATOR CUFF REPAIR    ? right  ? TENDON REPAIR    ? left arm  ? TONSILLECTOMY    ? TOTAL KNEE ARTHROPLASTY Right 2010  ? WISDOM TOOTH EXTRACTION    ? ?Allergies  ?Allergen Reactions  ? Ibuprofen Anaphylaxis and Other (See Comments)  ?  Upper GI Bled  ? Nsaids Other (See Comments)  ?  GI Bleed  ? ?No current facility-administered medications on file prior to encounter.  ? ?Current Outpatient Medications on File Prior to Encounter  ?Medication Sig Dispense Refill  ? buPROPion (WELLBUTRIN XL) 300 MG 24 hr tablet Take 300 mg by mouth every morning.    ? dasatinib (SPRYCEL) 70 MG tablet Take 1 tablet (70 mg total) by mouth daily. 30 tablet 3  ? metFORMIN (GLUCOPHAGE-XR) 500 MG  24 hr tablet Take 500 mg by mouth 2 (two) times daily.    ? sildenafil (VIAGRA) 50 MG tablet Take 50 mg by mouth daily as needed.    ? XARELTO 10 MG TABS tablet TAKE 1 TABLET BY MOUTH EVERY DAY 30 tablet 11  ? cephALEXin (KEFLEX) 500 MG capsule Take 2,000 mg by mouth See admin instructions. Prior to dental procedure (Patient not taking: Reported on 09/12/2021)    ? desmopressin (DDAVP) 0.2 MG tablet Take 400 mcg by mouth at bedtime.    ? fluticasone (FLONASE) 50 MCG/ACT nasal spray Place 1 spray into both nostrils daily as needed for allergies.    ? glucose blood test strip OneTouch Ultra Blue Test Strip ? USE TO TEST BLOOD SUGAR ONCE DAILY    ? LAGEVRIO 200 MG CAPS capsule Take 4 capsules by mouth 2 (two) times daily.    ? lidocaine (LIDODERM) 5 % Place 1 patch onto the skin daily. Remove & Discard  patch within 12 hours or as directed by MD 30 patch 0  ? Lidocaine 4 % PTCH Apply 1 patch topically daily as needed (pain).    ? LORazepam (ATIVAN) 0.5 MG tablet Take 0.5 mg by mouth at bedtime.  3  ? losartan-hydrochlorothiazide (HYZAAR) 100-12.5 MG tablet Take 1 tablet by mouth daily.    ? moxifloxacin (VIGAMOX) 0.5 % ophthalmic solution Place 1 drop into the right eye See admin instructions. Place 1 drop in right eye QID the day before, day of, and day after treatment.  5  ? polyethylene glycol (MIRALAX / GLYCOLAX) 17 g packet Take 17 g by mouth daily as needed for moderate constipation.    ? PRESCRIPTION MEDICATION Inject 1 Dose as directed every 8 (eight) weeks. Gets injections in right eye at dr's office every 8 weeks    ? rosuvastatin (CRESTOR) 20 MG tablet Take 20 mg by mouth at bedtime.    ? testosterone cypionate (DEPOTESTOSTERONE CYPIONATE) 200 MG/ML injection Inject 200 mg into the muscle every 21 ( twenty-one) days.    ? TRULICITY 1.5 ZH/2.9JM SOPN Inject 1.5 mg into the muscle every Monday.    ? vitamin B-12 (CYANOCOBALAMIN) 1000 MCG tablet Take 1,000 mcg by mouth daily.    ? ?Social History  ? ?Socioeconomic History  ? Marital status: Married  ?  Spouse name: Not on file  ? Number of children: 2  ? Years of education: Not on file  ? Highest education level: Not on file  ?Occupational History  ? Occupation: retired  ?Tobacco Use  ? Smoking status: Former  ?  Types: Pipe  ?  Quit date: 5  ?  Years since quitting: 48.3  ? Smokeless tobacco: Never  ?Vaping Use  ? Vaping Use: Never used  ?Substance and Sexual Activity  ? Alcohol use: Yes  ?  Alcohol/week: 0.0 standard drinks  ? Drug use: No  ? Sexual activity: Not on file  ?Other Topics Concern  ? Not on file  ?Social History Narrative  ? Right handed.  Caffeine 3 cups daily,  Married 2 kids (one deceased).  College grad.  [employer].   ? ?Social Determinants of Health  ? ?Financial Resource Strain: Not on file  ?Food  Insecurity: Not on file  ?Transportation Needs: Not on file  ?Physical Activity: Not on file  ?Stress: Not on file  ?Social Connections: Not on file  ?Intimate Partner Violence: Not on file  ? ?Family History  ?Problem Relation Age of Onset  ?  Bipolar disorder Son   ?     committed suicide  ? Colon cancer Other   ? Heart failure Other   ? Heart attack Other   ? Lung disease Father   ?     history  ? Diabetes Mother   ?     history  ? Stroke Mother   ?     history  ? Colon polyps Neg Hx   ? Rectal cancer Neg Hx   ? Stomach cancer Neg Hx   ? Esophageal cancer Neg Hx   ? ? ?OBJECTIVE: ? ?Vitals:  ? 04/23/22 1720  ?BP: 122/62  ?Pulse: 82  ?Resp: 18  ?Temp: 98.9 ?F (37.2 ?C)  ?TempSrc: Oral  ?SpO2: 95%  ?  ? ?Physical Exam ?Vitals and nursing note reviewed.  ?Constitutional:   ?   General: He is not in acute distress. ?   Appearance: Normal appearance. He is normal weight. He is not ill-appearing, toxic-appearing or diaphoretic.  ?Cardiovascular:  ?   Rate and Rhythm: Normal rate and regular rhythm.  ?   Pulses: Normal pulses.  ?   Heart sounds: Normal heart sounds. No murmur heard. ?  No friction rub. No gallop.  ?Pulmonary:  ?   Effort: Pulmonary effort is normal. No respiratory distress.  ?   Breath sounds: Normal breath sounds. No stridor. No wheezing, rhonchi or rales.  ?Chest:  ?   Chest wall: No tenderness.  ?Musculoskeletal:  ?   Cervical back: Normal.  ?   Thoracic back: Spasms and tenderness present.  ?   Lumbar back: Normal.  ?Neurological:  ?   Mental Status: He is alert and oriented to person, place, and time.  ?  ? ?LABS: ? ?No results found for this or any previous visit (from the past 24 hour(s)).  ? ?RADIOLOGY ? ?DG Lumbar Spine Complete ? ?Result Date: 04/23/2022 ?CLINICAL DATA:  Right-sided mid to lower back pain. EXAM: LUMBAR SPINE - COMPLETE 4+ VIEW COMPARISON:  October 07, 2012 FINDINGS: There is no evidence of an acute lumbar spine fracture. A chronic compression fracture deformity is seen involving  the L2 vertebral body. There is approximately 7 mm anterolisthesis of the L4 vertebral body on L5. This is increased in severity when compared to the prior study. Marked severity endplate sclerosis is seen at the

## 2022-04-24 DIAGNOSIS — S32020A Wedge compression fracture of second lumbar vertebra, initial encounter for closed fracture: Secondary | ICD-10-CM | POA: Diagnosis not present

## 2022-04-24 LAB — POCT URINALYSIS DIPSTICK, ED / UC
Bilirubin Urine: NEGATIVE
Glucose, UA: NEGATIVE mg/dL
Hgb urine dipstick: NEGATIVE
Ketones, ur: NEGATIVE mg/dL
Leukocytes,Ua: NEGATIVE
Nitrite: NEGATIVE
Protein, ur: NEGATIVE mg/dL
Specific Gravity, Urine: 1.02 (ref 1.005–1.030)
Urobilinogen, UA: 1 mg/dL (ref 0.0–1.0)
pH: 6 (ref 5.0–8.0)

## 2022-04-26 ENCOUNTER — Inpatient Hospital Stay (HOSPITAL_BASED_OUTPATIENT_CLINIC_OR_DEPARTMENT_OTHER): Payer: Medicare Other | Admitting: Hematology & Oncology

## 2022-04-26 ENCOUNTER — Inpatient Hospital Stay: Payer: Medicare Other | Attending: Hematology & Oncology

## 2022-04-26 ENCOUNTER — Other Ambulatory Visit: Payer: Self-pay

## 2022-04-26 ENCOUNTER — Encounter: Payer: Self-pay | Admitting: Hematology & Oncology

## 2022-04-26 VITALS — BP 131/77 | HR 77 | Temp 97.9°F | Resp 18 | Ht 66.0 in | Wt 174.4 lb

## 2022-04-26 DIAGNOSIS — D45 Polycythemia vera: Secondary | ICD-10-CM

## 2022-04-26 DIAGNOSIS — C9210 Chronic myeloid leukemia, BCR/ABL-positive, not having achieved remission: Secondary | ICD-10-CM | POA: Insufficient documentation

## 2022-04-26 DIAGNOSIS — I81 Portal vein thrombosis: Secondary | ICD-10-CM

## 2022-04-26 LAB — CMP (CANCER CENTER ONLY)
ALT: 17 U/L (ref 0–44)
AST: 23 U/L (ref 15–41)
Albumin: 4.3 g/dL (ref 3.5–5.0)
Alkaline Phosphatase: 80 U/L (ref 38–126)
Anion gap: 6 (ref 5–15)
BUN: 20 mg/dL (ref 8–23)
CO2: 27 mmol/L (ref 22–32)
Calcium: 11.3 mg/dL — ABNORMAL HIGH (ref 8.9–10.3)
Chloride: 104 mmol/L (ref 98–111)
Creatinine: 1.28 mg/dL — ABNORMAL HIGH (ref 0.61–1.24)
GFR, Estimated: 58 mL/min — ABNORMAL LOW (ref >60)
Glucose, Bld: 108 mg/dL — ABNORMAL HIGH (ref 70–99)
Potassium: 4.8 mmol/L (ref 3.5–5.1)
Sodium: 137 mmol/L (ref 135–145)
Total Bilirubin: 0.5 mg/dL (ref 0.3–1.2)
Total Protein: 7 g/dL (ref 6.5–8.1)

## 2022-04-26 LAB — CBC WITH DIFFERENTIAL (CANCER CENTER ONLY)
Abs Immature Granulocytes: 0.01 K/uL (ref 0.00–0.07)
Basophils Absolute: 0 K/uL (ref 0.0–0.1)
Basophils Relative: 1 %
Eosinophils Absolute: 0.2 K/uL (ref 0.0–0.5)
Eosinophils Relative: 3 %
HCT: 50 % (ref 39.0–52.0)
Hemoglobin: 16 g/dL (ref 13.0–17.0)
Immature Granulocytes: 0 %
Lymphocytes Relative: 14 %
Lymphs Abs: 0.7 K/uL (ref 0.7–4.0)
MCH: 30.1 pg (ref 26.0–34.0)
MCHC: 32 g/dL (ref 30.0–36.0)
MCV: 94 fL (ref 80.0–100.0)
Monocytes Absolute: 0.8 K/uL (ref 0.1–1.0)
Monocytes Relative: 15 %
Neutro Abs: 3.4 K/uL (ref 1.7–7.7)
Neutrophils Relative %: 67 %
Platelet Count: 310 K/uL (ref 150–400)
RBC: 5.32 MIL/uL (ref 4.22–5.81)
RDW: 15.9 % — ABNORMAL HIGH (ref 11.5–15.5)
WBC Count: 5.1 K/uL (ref 4.0–10.5)
nRBC: 0 % (ref 0.0–0.2)

## 2022-04-26 LAB — FERRITIN: Ferritin: 25 ng/mL (ref 24–336)

## 2022-04-26 LAB — IRON AND IRON BINDING CAPACITY (CC-WL,HP ONLY)
Iron: 52 ug/dL (ref 45–182)
Saturation Ratios: 13 % — ABNORMAL LOW (ref 17.9–39.5)
TIBC: 393 ug/dL (ref 250–450)
UIBC: 341 ug/dL (ref 117–376)

## 2022-04-26 LAB — LACTATE DEHYDROGENASE: LDH: 167 U/L (ref 98–192)

## 2022-04-26 NOTE — Progress Notes (Signed)
?Hematology and Oncology Follow Up Visit ? ?James [name]. ?[id number] ?Jul 15, 1946 76 y.o. ?04/26/2022 ? ? 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 ?Current Therapy:   ? ?Spyrcel 70 mg po q day  -- changed on 01/27/2021 ?Xarelto 10 mg by mouth daily ?Phlebotomy to maintain hematocrit below 45%. ?IV iron as indicated - last received in March 2022     ?   ?Interim History:  James [name] is here today for follow-up.  We last saw him back in September.  Since then, he has been doing pretty well.  He did have the shoulder surgery for his right shoulder.  He did well with this.  He is recovered quite nicely. ? ?He is doing well on the Sprycel.  His last BCR/ABL was down to 0.024%. ? ?He has had no problems with bleeding.  He has had no change in bowel or bladder habits.  He has had no problems with the Xarelto. ? ?His weight is still down.  He really wants to keep his weight down.  I am just very impressed with how much weight he has lost. ? ?Overall, I would say his performance status is ECOG 1.   ? ? ?Medications:  ?Allergies as of 04/26/2022   ? ?   Reactions  ? Ibuprofen Anaphylaxis, Other (See Comments)  ? Upper GI Bled  ? Nsaids Other (See Comments)  ? GI Bleed  ? ?  ? ?  ?Medication List  ?  ? ?  ? Accurate as of Apr 26, 2022  8:23 AM. If you have any questions, ask your nurse or doctor.  ?  ?  ? ?  ? ?buPROPion 300 MG 24 hr tablet ?Commonly known as: WELLBUTRIN XL ?Take 300 mg by mouth every morning. ?  ?cephALEXin 500 MG capsule ?Commonly known as: KEFLEX ?Take 2,000 mg by mouth See admin instructions. Prior to dental procedure ?  ?dasatinib 70 MG tablet ?Commonly known as: SPRYCEL ?Take 1 tablet (70 mg total) by mouth daily. ?  ?desmopressin 0.2 MG tablet ?Commonly known as: DDAVP ?Take 400 mcg by mouth at bedtime. ?  ?fluticasone 50 MCG/ACT nasal spray ?Commonly known as: FLONASE ?Place 1 spray into both nostrils daily as needed  for allergies. ?  ?glucose blood test strip ?OneTouch Ultra [employer] ? USE TO TEST BLOOD SUGAR ONCE DAILY ?  ?Lagevrio 200 MG Caps capsule ?Generic drug: molnupiravir EUA ?Take 4 capsules by mouth 2 (two) times daily. ?  ?lidocaine 4 % ?Apply 1 patch topically daily as needed (pain). ?  ?lidocaine 5 % ?Commonly known as: Lidoderm ?Place 1 patch onto the skin daily. Remove & Discard patch within 12 hours or as directed by MD ?  ?LORazepam 0.5 MG tablet ?Commonly known as: ATIVAN ?Take 0.5 mg by mouth at bedtime. ?  ?losartan-hydrochlorothiazide 100-12.5 MG tablet ?Commonly known as: HYZAAR ?Take 1 tablet by mouth daily. ?  ?metFORMIN 500 MG 24 hr tablet ?Commonly known as: GLUCOPHAGE-XR ?Take 500 mg by mouth 2 (two) times daily. ?  ?moxifloxacin 0.5 % ophthalmic solution ?Commonly known as: VIGAMOX ?Place 1 drop into the right eye See admin instructions. Place 1 drop in right eye QID the day before, day of, and day after treatment. ?  ?polyethylene glycol 17 g packet ?Commonly known as: MIRALAX / GLYCOLAX ?Take 17 g by mouth daily as needed for moderate constipation. ?  ?PRESCRIPTION MEDICATION ?Inject 1 Dose as directed  every 8 (eight) weeks. Gets injections in right eye at dr's office every 8 weeks ?  ?rosuvastatin 20 MG tablet ?Commonly known as: CRESTOR ?Take 20 mg by mouth at bedtime. ?  ?sildenafil 50 MG tablet ?Commonly known as: VIAGRA ?Take 50 mg by mouth daily as needed. ?  ?testosterone cypionate 200 MG/ML injection ?Commonly known as: DEPOTESTOSTERONE CYPIONATE ?Inject 200 mg into the muscle every 21 ( twenty-one) days. ?  ?traMADol 50 MG tablet ?Commonly known as: ULTRAM ?Take 1 tablet (50 mg total) by mouth every 12 (twelve) hours as needed for up to 6 days. ?  ?Trulicity 1.5 TF/5.7DU Sopn ?Generic drug: Dulaglutide ?Inject 1.5 mg into the muscle every Monday. ?  ?vitamin B-12 1000 MCG tablet ?Commonly known as: CYANOCOBALAMIN ?Take 1,000 mcg by mouth daily. ?  ?Xarelto 10 MG Tabs tablet ?Generic  drug: rivaroxaban ?TAKE 1 TABLET BY MOUTH EVERY DAY ?  ? ?  ? ? ?Allergies:  ?Allergies  ?Allergen Reactions  ? Ibuprofen Anaphylaxis and Other (See Comments)  ?  Upper GI Bled  ? Nsaids Other (See Comments)  ?  GI Bleed  ? ? ?Past Medical History, Surgical history, Social history, and Family History were reviewed and updated. ? ?Review of Systems: ?Review of Systems  ?Constitutional: Negative.   ?HENT: Negative.    ?Eyes: Negative.   ?Respiratory: Negative.    ?Cardiovascular: Negative.   ?Gastrointestinal: Negative.   ?Genitourinary: Negative.   ?Musculoskeletal: Negative.   ?Skin: Negative.   ?Neurological: Negative.   ?Endo/Heme/Allergies: Negative.   ?Psychiatric/Behavioral: Negative.    ? ?Physical Exam: ? height is 5' 6"  (1.676 m) and weight is 174 lb 6.4 oz (79.1 kg). His oral temperature is 97.9 ?F (36.6 ?C). His blood pressure is 131/77 and his pulse is 77. His respiration is 18 and oxygen saturation is 96%.  ? ?Wt Readings from Last 3 Encounters:  ?04/26/22 174 lb 6.4 oz (79.1 kg)  ?11/09/21 165 lb (74.8 kg)  ?10/26/21 165 lb (74.8 kg)  ? ? ?Physical Exam ?Vitals reviewed.  ?HENT:  ?   Head: Normocephalic and atraumatic.  ?Eyes:  ?   Pupils: Pupils are equal, round, and reactive to light.  ?Cardiovascular:  ?   Rate and Rhythm: Normal rate and regular rhythm.  ?   Heart sounds: Normal heart sounds.  ?Pulmonary:  ?   Effort: Pulmonary effort is normal.  ?   Breath sounds: Normal breath sounds.  ?Abdominal:  ?   General: Bowel sounds are normal.  ?   Palpations: Abdomen is soft.  ?Musculoskeletal:     ?   General: No tenderness or deformity. Normal range of motion.  ?   Cervical back: Normal range of motion.  ?Lymphadenopathy:  ?   Cervical: No cervical adenopathy.  ?Skin: ?   General: Skin is warm and dry.  ?   Findings: No erythema or rash.  ?Neurological:  ?   Mental Status: He is alert and oriented to person, place, and time.  ?Psychiatric:     ?   Behavior: Behavior normal.     ?   Thought Content:  Thought content normal.     ?   Judgment: Judgment normal.  ? ? ? ?Lab Results  ?Component Value Date  ? WBC 5.1 04/26/2022  ? HGB 16.0 04/26/2022  ? HCT 50.0 04/26/2022  ? MCV 94.0 04/26/2022  ? PLT 310 04/26/2022  ? ?Lab Results  ?Component Value Date  ? FERRITIN 181 09/12/2021  ? IRON 50 09/12/2021  ?  TIBC 275 09/12/2021  ? UIBC 225 09/12/2021  ? IRONPCTSAT 18 (L) 09/12/2021  ? ?Lab Results  ?Component Value Date  ? RETICCTPCT 0.8 03/24/2021  ? RBC 5.32 04/26/2022  ? RETICCTABS 94.0 08/30/2011  ? ?No results found for: KPAFRELGTCHN, LAMBDASER, KAPLAMBRATIO ?Lab Results  ?Component Value Date  ? IGGSERUM 828 10/11/2016  ? IGA 177 06/22/2016  ? ?No results found for: TOTALPROTELP, ALBUMINELP, A1GS, A2GS, BETS, BETA2SER, GAMS, MSPIKE, SPEI ?  Chemistry   ?   ?Component Value Date/Time  ? NA 139 09/12/2021 0831  ? NA 137 10/29/2017 1307  ? NA 134 (L) 05/10/2017 0755  ? K 4.4 09/12/2021 0831  ? K 3.6 10/29/2017 1307  ? K 4.2 05/10/2017 0755  ? CL 103 09/12/2021 0831  ? CL 101 10/29/2017 1307  ? CO2 28 09/12/2021 0831  ? CO2 25 10/29/2017 1307  ? CO2 20 (L) 05/10/2017 0755  ? BUN 18 09/12/2021 0831  ? BUN 14 10/29/2017 1307  ? BUN 21.0 05/10/2017 0755  ? CREATININE 1.18 09/12/2021 0831  ? CREATININE 1.4 (H) 10/29/2017 1307  ? CREATININE 1.3 05/10/2017 0755  ?    ?Component Value Date/Time  ? CALCIUM 10.7 (H) 09/12/2021 0831  ? CALCIUM 10.3 10/29/2017 1307  ? CALCIUM 10.3 05/10/2017 0755  ? ALKPHOS 113 09/12/2021 0831  ? ALKPHOS 77 10/29/2017 1307  ? ALKPHOS 97 05/10/2017 0755  ? AST 21 09/12/2021 0831  ? AST 25 05/10/2017 0755  ? ALT 18 09/12/2021 0831  ? ALT 27 10/29/2017 1307  ? ALT 26 05/10/2017 0755  ? BILITOT 0.5 09/12/2021 0831  ? BILITOT 0.70 05/10/2017 0755  ?  ? ?Impression and Plan: Mr. Mcfarren is 76 year old gentleman with a long history of polycythemia. He is JAK2 negative.  He has transformed over to [facility]  He is on Sprycel.  He is responding quite nicely.  Thankfully, I would consider him in a major  molecular remission. ? ?Hopefully, his weight will hold steady.  Again he looks pretty good.  He feels good. ? ?I realize that his blood count is on the higher side.  However, I would not phlebotomize him at all.

## 2022-05-02 ENCOUNTER — Telehealth: Payer: Self-pay | Admitting: *Deleted

## 2022-05-02 NOTE — Telephone Encounter (Signed)
Pt notified per order of Dr. [name] that the BCR/ABL results are back and the CML is in remission.  Pt is appreciative of call and has no questions or concerns at this time.  ?

## 2022-05-03 LAB — BCR/ABL

## 2022-05-24 ENCOUNTER — Ambulatory Visit: Payer: Medicare Other | Admitting: Adult Health

## 2022-06-07 ENCOUNTER — Other Ambulatory Visit: Payer: Self-pay | Admitting: Hematology & Oncology

## 2022-06-07 DIAGNOSIS — C9210 Chronic myeloid leukemia, BCR/ABL-positive, not having achieved remission: Secondary | ICD-10-CM

## 2022-07-26 ENCOUNTER — Inpatient Hospital Stay (HOSPITAL_BASED_OUTPATIENT_CLINIC_OR_DEPARTMENT_OTHER): Payer: Medicare Other | Admitting: Hematology & Oncology

## 2022-07-26 ENCOUNTER — Other Ambulatory Visit: Payer: Self-pay

## 2022-07-26 ENCOUNTER — Inpatient Hospital Stay: Payer: Medicare Other

## 2022-07-26 ENCOUNTER — Inpatient Hospital Stay: Payer: Medicare Other | Attending: Hematology & Oncology

## 2022-07-26 ENCOUNTER — Encounter: Payer: Self-pay | Admitting: Hematology & Oncology

## 2022-07-26 VITALS — BP 128/81 | HR 69 | Temp 97.6°F

## 2022-07-26 VITALS — BP 134/76 | HR 75 | Temp 98.2°F | Resp 18 | Wt 176.0 lb

## 2022-07-26 DIAGNOSIS — D500 Iron deficiency anemia secondary to blood loss (chronic): Secondary | ICD-10-CM

## 2022-07-26 DIAGNOSIS — D45 Polycythemia vera: Secondary | ICD-10-CM

## 2022-07-26 DIAGNOSIS — C9210 Chronic myeloid leukemia, BCR/ABL-positive, not having achieved remission: Secondary | ICD-10-CM

## 2022-07-26 DIAGNOSIS — I81 Portal vein thrombosis: Secondary | ICD-10-CM

## 2022-07-26 LAB — IRON AND IRON BINDING CAPACITY (CC-WL,HP ONLY)
Iron: 70 ug/dL (ref 45–182)
Saturation Ratios: 20 % (ref 17.9–39.5)
TIBC: 353 ug/dL (ref 250–450)
UIBC: 283 ug/dL (ref 117–376)

## 2022-07-26 LAB — FERRITIN: Ferritin: 23 ng/mL — ABNORMAL LOW (ref 24–336)

## 2022-07-26 LAB — CMP (CANCER CENTER ONLY)
ALT: 16 U/L (ref 0–44)
AST: 22 U/L (ref 15–41)
Albumin: 4.5 g/dL (ref 3.5–5.0)
Alkaline Phosphatase: 73 U/L (ref 38–126)
Anion gap: 7 (ref 5–15)
BUN: 21 mg/dL (ref 8–23)
CO2: 26 mmol/L (ref 22–32)
Calcium: 11 mg/dL — ABNORMAL HIGH (ref 8.9–10.3)
Chloride: 105 mmol/L (ref 98–111)
Creatinine: 1.36 mg/dL — ABNORMAL HIGH (ref 0.61–1.24)
GFR, Estimated: 54 mL/min — ABNORMAL LOW (ref >60)
Glucose, Bld: 116 mg/dL — ABNORMAL HIGH (ref 70–99)
Potassium: 4.4 mmol/L (ref 3.5–5.1)
Sodium: 138 mmol/L (ref 135–145)
Total Bilirubin: 0.7 mg/dL (ref 0.3–1.2)
Total Protein: 6.6 g/dL (ref 6.5–8.1)

## 2022-07-26 LAB — CBC WITH DIFFERENTIAL (CANCER CENTER ONLY)
Abs Immature Granulocytes: 0.02 10*3/uL (ref 0.00–0.07)
Basophils Absolute: 0 10*3/uL (ref 0.0–0.1)
Basophils Relative: 1 %
Eosinophils Absolute: 0.3 10*3/uL (ref 0.0–0.5)
Eosinophils Relative: 5 %
HCT: 48.1 % (ref 39.0–52.0)
Hemoglobin: 15.6 g/dL (ref 13.0–17.0)
Immature Granulocytes: 0 %
Lymphocytes Relative: 14 %
Lymphs Abs: 0.8 10*3/uL (ref 0.7–4.0)
MCH: 29.8 pg (ref 26.0–34.0)
MCHC: 32.4 g/dL (ref 30.0–36.0)
MCV: 91.8 fL (ref 80.0–100.0)
Monocytes Absolute: 0.7 10*3/uL (ref 0.1–1.0)
Monocytes Relative: 13 %
Neutro Abs: 3.7 10*3/uL (ref 1.7–7.7)
Neutrophils Relative %: 67 %
Platelet Count: 236 10*3/uL (ref 150–400)
RBC: 5.24 MIL/uL (ref 4.22–5.81)
RDW: 18.4 % — ABNORMAL HIGH (ref 11.5–15.5)
WBC Count: 5.5 10*3/uL (ref 4.0–10.5)
nRBC: 0 % (ref 0.0–0.2)

## 2022-07-26 LAB — SAVE SMEAR(SSMR), FOR PROVIDER SLIDE REVIEW

## 2022-07-26 LAB — LACTATE DEHYDROGENASE: LDH: 177 U/L (ref 98–192)

## 2022-07-26 MED ORDER — SODIUM CHLORIDE 0.9 % IV SOLN: INTRAVENOUS | Status: AC

## 2022-07-26 MED ORDER — SODIUM CHLORIDE 0.9 % IV SOLN: Freq: Once | INTRAVENOUS | Status: DC

## 2022-07-26 NOTE — Progress Notes (Signed)
Hematology and Oncology Follow Up Visit  James [name] [id number] February 24, 1946 77 y.o. 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 IV iron as indicated - last received in March 2022        Interim History:  James [name] is here today for follow-up.  He looks quite good.  He actually looks little bit sunburned today.  His hemoglobin is 15 and hematocrit 48.1%.  We will go ahead and phlebotomize him today.  I will of the trouble by the fact that his renal function continues to decrease slowly but surely.  Some of this might be from having "thick" blood.  I know that he is on Xarelto.  I think that a phlebotomy would certainly help with this and also some fluid afterward would help.  He is doing okay on the Sprycel.  His last BCR/ABL was not detectable.  He has had no problems with his shoulder.  His shoulder surgery back in October.  This was his right shoulder.  He has been trying to do some exercises in the pool.  He has had no change in bowel or bladder habits.  He has had no rashes.  There has been no fever.   His last iron studies back in May showed a ferritin of 25 with an iron saturation of 13%.    Currently, I was his performance status is probably ECOG 1.    Medications:  Allergies as of 07/26/2022       Reactions   Ibuprofen Anaphylaxis, Other (See Comments)   Upper GI Bled   Nsaids Other (See Comments)   GI Bleed        Medication List        Accurate as of July 26, 2022  8:31 AM. If you have any questions, ask your nurse or doctor.          STOP taking these medications    Lagevrio 200 MG Caps capsule Generic drug: molnupiravir EUA Stopped by: [name], MD   rosuvastatin 20 MG tablet Commonly known as:  CRESTOR Stopped by: [name], MD       TAKE these medications    buPROPion 300 MG 24 hr tablet Commonly known as: WELLBUTRIN XL Take 300 mg by mouth every morning.   cephALEXin 500 MG capsule Commonly known as: KEFLEX Take 2,000 mg by mouth See admin instructions. Prior to dental procedure   cyanocobalamin 1000 MCG tablet Commonly known as: VITAMIN B12 Take 1,000 mcg by mouth daily.   desmopressin 0.2 MG tablet Commonly known as: DDAVP Take 400 mcg by mouth at bedtime.   fluticasone 50 MCG/ACT nasal spray Commonly known as: FLONASE Place 1 spray into both nostrils daily as needed for allergies.   glucose blood test strip OneTouch Ultra Blue Test Strip  USE TO TEST BLOOD SUGAR ONCE DAILY   lidocaine 4 % Apply 1 patch topically daily as needed (pain).   lidocaine 5 % Commonly known as: Lidoderm Place 1 patch onto the skin daily. Remove & Discard patch within 12 hours or as directed by MD   LORazepam 0.5 MG tablet Commonly known as: ATIVAN Take 0.5 mg by mouth at bedtime.   losartan-hydrochlorothiazide 100-12.5 MG tablet Commonly known as: HYZAAR Take 1 tablet  by mouth daily.   metFORMIN 500 MG 24 hr tablet Commonly known as: GLUCOPHAGE-XR Take 500 mg by mouth 2 (two) times daily.   moxifloxacin 0.5 % ophthalmic solution Commonly known as: VIGAMOX Place 1 drop into the right eye See admin instructions. Place 1 drop in right eye QID the day before, day of, and day after treatment.   polyethylene glycol 17 g packet Commonly known as: MIRALAX / GLYCOLAX Take 17 g by mouth daily as needed for moderate constipation.   PRESCRIPTION MEDICATION Inject 1 Dose as directed every 8 (eight) weeks. Gets injections in right eye at dr's office every 8 weeks   sildenafil 50 MG tablet Commonly known as: VIAGRA Take 50 mg by mouth daily as needed.   Sprycel 70 MG tablet Generic drug: dasatinib TAKE 1 TABLET BY MOUTH ONCE DAILY   testosterone cypionate 200 MG/ML  injection Commonly known as: DEPOTESTOSTERONE CYPIONATE Inject 200 mg into the muscle every 21 ( twenty-one) days.   Trulicity 1.5 PX/1.0GY Sopn Generic drug: Dulaglutide Inject 1.5 mg into the muscle every Monday.   Xarelto 10 MG Tabs tablet Generic drug: rivaroxaban TAKE 1 TABLET BY MOUTH EVERY DAY        Allergies:  Allergies  Allergen Reactions   Ibuprofen Anaphylaxis and Other (See Comments)    Upper GI Bled   Nsaids Other (See Comments)    GI Bleed    Past Medical History, Surgical history, Social history, and Family History were reviewed and updated.  Review of Systems: Review of Systems  Constitutional: Negative.   HENT: Negative.    Eyes: Negative.   Respiratory: Negative.    Cardiovascular: Negative.   Gastrointestinal: Negative.   Genitourinary: Negative.   Musculoskeletal: Negative.   Skin: Negative.   Neurological: Negative.   Endo/Heme/Allergies: Negative.   Psychiatric/Behavioral: Negative.      Physical Exam:  weight is 176 lb (79.8 kg). His oral temperature is 98.2 F (36.8 C). His blood pressure is 134/76 and his pulse is 75. His respiration is 18 and oxygen saturation is 97%.   Wt Readings from Last 3 Encounters:  07/26/22 176 lb (79.8 kg)  04/26/22 174 lb 6.4 oz (79.1 kg)  11/09/21 165 lb (74.8 kg)    Physical Exam Vitals reviewed.  HENT:     Head: Normocephalic and atraumatic.  Eyes:     Pupils: Pupils are equal, round, and reactive to light.  Cardiovascular:     Rate and Rhythm: Normal rate and regular rhythm.     Heart sounds: Normal heart sounds.  Pulmonary:     Effort: Pulmonary effort is normal.     Breath sounds: Normal breath sounds.  Abdominal:     General: Bowel sounds are normal.     Palpations: Abdomen is soft.  Musculoskeletal:        General: No tenderness or deformity. Normal range of motion.     Cervical back: Normal range of motion.  Lymphadenopathy:     Cervical: No cervical adenopathy.  Skin:    General:  Skin is warm and dry.     Findings: No erythema or rash.  Neurological:     Mental Status: He is alert and oriented to person, place, and time.  Psychiatric:        Behavior: Behavior normal.        Thought Content: Thought content normal.        Judgment: Judgment normal.      Lab Results  Component Value Date   WBC  5.5 07/26/2022   HGB 15.6 07/26/2022   HCT 48.1 07/26/2022   MCV 91.8 07/26/2022   PLT 236 07/26/2022   Lab Results  Component Value Date   FERRITIN 25 04/26/2022   IRON 52 04/26/2022   TIBC 393 04/26/2022   UIBC 341 04/26/2022   IRONPCTSAT 13 (L) 04/26/2022   Lab Results  Component Value Date   RETICCTPCT 0.8 03/24/2021   RBC 5.24 07/26/2022   RETICCTABS 94.0 08/30/2011   No results found for: "KPAFRELGTCHN", "LAMBDASER", "KAPLAMBRATIO" Lab Results  Component Value Date   IGGSERUM 828 10/11/2016   IGA 177 06/22/2016   No results found for: "TOTALPROTELP", "ALBUMINELP", "A1GS", "A2GS", "BETS", "BETA2SER", "GAMS", "MSPIKE", "SPEI"   Chemistry      Component Value Date/Time   NA 137 04/26/2022 0752   NA 137 10/29/2017 1307   NA 134 (L) 05/10/2017 0755   K 4.8 04/26/2022 0752   K 3.6 10/29/2017 1307   K 4.2 05/10/2017 0755   CL 104 04/26/2022 0752   CL 101 10/29/2017 1307   CO2 27 04/26/2022 0752   CO2 25 10/29/2017 1307   CO2 20 (L) 05/10/2017 0755   BUN 20 04/26/2022 0752   BUN 14 10/29/2017 1307   BUN 21.0 05/10/2017 0755   CREATININE 1.28 (H) 04/26/2022 0752   CREATININE 1.4 (H) 10/29/2017 1307   CREATININE 1.3 05/10/2017 0755      Component Value Date/Time   CALCIUM 11.3 (H) 04/26/2022 0752   CALCIUM 10.3 10/29/2017 1307   CALCIUM 10.3 05/10/2017 0755   ALKPHOS 80 04/26/2022 0752   ALKPHOS 77 10/29/2017 1307   ALKPHOS 97 05/10/2017 0755   AST 23 04/26/2022 0752   AST 25 05/10/2017 0755   ALT 17 04/26/2022 0752   ALT 27 10/29/2017 1307   ALT 26 05/10/2017 0755   BILITOT 0.5 04/26/2022 0752   BILITOT 0.70 05/10/2017 0755      Impression and Plan: Mr. Cossey is 76 year old gentleman with a long history of polycythemia. He is JAK2 negative.  He has transformed over to [facility].  He is on Sprycel.  He is responding quite nicely.  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  Again, I worry about the renal function.  The trend is slowly downward.  Maybe, we will see if a phlebotomy with some IV fluid out would would help.  I am happy that he has lost his weight.  He is keeping his weight down.  I would like to see him back in late September.  I think we have to see him back a little bit sooner just because of the renal function.   [name], MD 8/9/20238:31 AM

## 2022-07-26 NOTE — Patient Instructions (Signed)

## 2022-07-26 NOTE — Progress Notes (Signed)
James [name]. presents today for phlebotomy per MD orders. Phlebotomy procedure started at 0855 per Tomasa Rand RN and ended at [phone]. 450 grams removed. Refreshments given.   Patient observed for 30 minutes after procedure without any incident. Patient tolerated procedure well. IV needle removed intact.

## 2022-07-27 ENCOUNTER — Ambulatory Visit: Payer: Medicare Other | Admitting: Adult Health

## 2022-08-03 ENCOUNTER — Telehealth: Payer: Self-pay

## 2022-08-03 LAB — BCR/ABL

## 2022-08-03 NOTE — Telephone Encounter (Signed)
-----   Message from [name], MD sent at 08/03/2022  1:18 PM EDT ----- Call and let him know that the chronic leukemia is in remission.  Thanks.  Laurey Arrow

## 2022-08-03 NOTE — Telephone Encounter (Signed)
Advised via MyChart.

## 2022-08-04 ENCOUNTER — Other Ambulatory Visit: Payer: Self-pay | Admitting: Hematology & Oncology

## 2022-08-04 DIAGNOSIS — C9210 Chronic myeloid leukemia, BCR/ABL-positive, not having achieved remission: Secondary | ICD-10-CM

## 2022-08-17 ENCOUNTER — Other Ambulatory Visit (HOSPITAL_COMMUNITY): Payer: Self-pay

## 2022-08-23 NOTE — Progress Notes (Signed)
PATIENT: James [name]. DOB: 03/24/1946  REASON FOR VISIT: follow up HISTORY FROM: patient Primary neurologist: Dr. Brett Fairy  Chief Complaint  Patient presents with   Follow-up    Pt in 18 Pt here for CPAP f/u Pt has no questions or concerns for today's visit      HISTORY OF PRESENT ILLNESS: Today 08/24/22:  [name] is a 76 year old male with a history of obstructive sleep apnea on CPAP.  He returns today for follow-up.  His download is below.  Reports that the CPAP is working well for him.  He denies any new issues.  Weight loss has remained stable.  He returns today for an evaluation.      [name] is a 76 year old male with a history of OSA on CPAP. He returns today for follow-up.  He states that since his last visit he has been diagnosed with lymphoma.  He takes a daily oral medication for this.  He states that he has had several falls.  Most recently was on Saturday and he dislocated his right shoulder.  He states that his PCP has adjusted some of his medications that he felt was causing dizziness.  He reports that the CPAP is working well.  He states that he has intentionally lost 60 pounds.  He wants to lose approximately 10 more pounds.  He denies any issues.  04/19/20: [name] is a 76 year old male with a history of obstructive sleep apnea on CPAP.  He returns today for follow-up.  His download indicates that he use his machine nightly for compliance of 100%.  He uses machine greater than 4 hours each night.  On average he uses his machine 7 hours and 29 minutes.  His residual AHI is 2.2 on 5 to 12 cm of water with EPR 3.  Leak in the 95th percentile was 14.9 L/min.  He returns today for evaluation.  HISTORY 02/16/20    [name] is a 76 year old male with a history of obstructive sleep apnea on CPAP.  He returns today for follow-up.  His download indicates that he use his machine 28 out of 30 days for compliance of 93%.  He uses machine greater than 4 hours 19 days  for compliance of 63%.  On average he uses his machine 4 hours and 58 minutes.  His residual AHI is 1.3 on 5 to 12 cm of water with EPR 3.  Leak in the 95th percentile is 15 L/min.  He recently got a new machine on the 17th.  Reports that he had to set it up himself.  We are unable to get this data.  We will recheck the DME company today.  REVIEW OF SYSTEMS: Out of a complete 14 system review of symptoms, the patient complains only of the following symptoms, and all other reviewed systems are negative.  ESS 3 FSS 19  ALLERGIES: Allergies  Allergen Reactions   Ibuprofen Anaphylaxis and Other (See Comments)    Upper GI Bled   Nsaids Other (See Comments)    GI Bleed    HOME MEDICATIONS: Outpatient Medications Prior to Visit  Medication Sig Dispense Refill   buPROPion (WELLBUTRIN XL) 300 MG 24 hr tablet Take 300 mg by mouth every morning.     cephALEXin (KEFLEX) 500 MG capsule Take 2,000 mg by mouth See admin instructions. Prior to dental procedure     desmopressin (DDAVP) 0.2 MG tablet Take 400 mcg by mouth at bedtime.     fluticasone (FLONASE)  50 MCG/ACT nasal spray Place 1 spray into both nostrils as needed for allergies.     glucose blood test strip OneTouch Ultra Blue Test Strip  USE TO TEST BLOOD SUGAR ONCE DAILY     lidocaine (LIDODERM) 5 % Place 1 patch onto the skin daily. Remove & Discard patch within 12 hours or as directed by MD (Patient taking differently: Place 1 patch onto the skin as needed. Remove & Discard patch within 12 hours or as directed by MD) 30 patch 0   Lidocaine 4 % PTCH Apply 1 patch topically as needed (pain).     LORazepam (ATIVAN) 0.5 MG tablet Take 0.5 mg by mouth at bedtime.  3   losartan-hydrochlorothiazide (HYZAAR) 100-12.5 MG tablet Take 1 tablet by mouth daily.     metFORMIN (GLUCOPHAGE-XR) 500 MG 24 hr tablet Take 500 mg by mouth 2 (two) times daily.     moxifloxacin (VIGAMOX) 0.5 % ophthalmic solution Place 1 drop into the right eye See admin  instructions. Place 1 drop in right eye QID the day before, day of, and day after treatment.  5   polyethylene glycol (MIRALAX / GLYCOLAX) 17 g packet Take 17 g by mouth as needed for moderate constipation.     PRESCRIPTION MEDICATION Inject 1 Dose as directed every 8 (eight) weeks. Gets injections in right eye at dr's office every 8 weeks     sildenafil (VIAGRA) 50 MG tablet Take 50 mg by mouth daily as needed.     SPRYCEL 70 MG tablet TAKE 1 TABLET BY MOUTH ONCE DAILY 30 tablet 0   testosterone cypionate (DEPOTESTOSTERONE CYPIONATE) 200 MG/ML injection Inject 200 mg into the muscle every 21 ( twenty-one) days.     TRULICITY 1.5 TG/6.2IR SOPN Inject 1.5 mg into the muscle every Monday.     vitamin B-12 (CYANOCOBALAMIN) 1000 MCG tablet Take 1,000 mcg by mouth daily.     XARELTO 10 MG TABS tablet TAKE 1 TABLET BY MOUTH EVERY DAY 30 tablet 11   No facility-administered medications prior to visit.    PAST MEDICAL HISTORY: Past Medical History:  Diagnosis Date   Anxiety    Arthritis    Clotting disorder ([place])    CML (chronic myelocytic leukemia) ([place]) 12/16/2020   Depression    DM (diabetes mellitus) ([facility])    GERD (gastroesophageal reflux disease)    Hiatal hernia    History of shingles 04/03/2014   HTN (hypertension)    Hyperlipidemia    Insomnia    resolved by using CPAP   Iron deficiency anemia due to chronic blood loss 11/20/2016   Macular degeneration    Rt eye   Nocturia more than twice per night 11/11/2014   For 6 month, 5 nocturias a night.    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)    History   Portal vein thrombosis    Rhinitis    Situational depression    Sleep apnea    uses CPAP every night   Tubular adenoma 12/10/2015   6 cecum polyps    PAST SURGICAL HISTORY: Past Surgical History:  Procedure Laterality Date   CARDIAC CATHETERIZATION     greater 10 yrs ago, normal   CARPAL TUNNEL RELEASE     bilateral   COLONOSCOPY  06/2005   [facility] Lajoyce [facility]    ESOPHAGOGASTRODUODENOSCOPY (EGD) WITH PROPOFOL N/A 09/28/2016   Procedure: ESOPHAGOGASTRODUODENOSCOPY (EGD) WITH PROPOFOL;  Surgeon: Gatha Mayer, MD;  Location: [facility];  Service: Endoscopy;  Laterality: N/A;  IR GENERIC HISTORICAL  12/12/2016   IR US GUIDE VASC ACCESS RIGHT 12/12/2016 [name], MD [facility]   IR GENERIC HISTORICAL  12/12/2016   IR VENOGRAM HEPATIC W HEMODYNAMIC EVALUATION 12/12/2016 [name], MD [facility]   IR GENERIC HISTORICAL  12/12/2016   IR TRANSCATHETER BX 12/12/2016 [name], MD [facility]   REVERSE SHOULDER ARTHROPLASTY Right 09/01/2021   Procedure: REVERSE SHOULDER ARTHROPLASTY;  Surgeon: Justice Britain, MD;  Location: [facility];  Service: Orthopedics;  Laterality: Right;  183mn,   ROTATOR CUFF REPAIR     right   TENDON REPAIR     left arm   TONSILLECTOMY     TOTAL KNEE ARTHROPLASTY Right 2010   WISDOM TOOTH EXTRACTION      FAMILY HISTORY: Family History  Problem Relation Age of Onset   Diabetes Mother        history   Stroke Mother        history   Lung disease Father        history   Bipolar disorder Son        committed suicide   Colon cancer Other    Heart failure Other    Heart attack Other    Colon polyps Neg Hx    Rectal cancer Neg Hx    Stomach cancer Neg Hx    Esophageal cancer Neg Hx    Sleep apnea Neg Hx     SOCIAL HISTORY: Social History   Socioeconomic History   Marital status: Married    Spouse name: Not on file   Number of children: 2   Years of education: Not on file   Highest education level: Not on file  Occupational History   Occupation: retired  Tobacco Use   Smoking status: Former    Types: Pipe    Quit date: 1975    Years since quitting: 48.7   Smokeless tobacco: Never  Vaping Use   Vaping Use: Never used  Substance and Sexual Activity   Alcohol use: Yes    Alcohol/week: 0.0 standard drinks of alcohol   Drug use: No   Sexual activity: Not on file  Other Topics Concern   Not on  file  Social History Narrative   Right handed.  Caffeine 3 cups daily,  Married 2 kids (one deceased).  College grad.  [employer]    Social Determinants of Health   Financial Resource Strain: Not on file  Food Insecurity: Not on file  Transportation Needs: Not on file  Physical Activity: Not on file  Stress: Not on file  Social Connections: Not on file  Intimate Partner Violence: Not on file      PHYSICAL EXAM  Vitals:   08/24/22 1429  BP: 126/79  Pulse: 79  Weight: 179 lb (81.2 kg)  Height: 5' 6"  (1.676 m)   Body mass index is 28.89 kg/m.  Generalized: Well developed, in no acute distress  Chest: Lungs clear to auscultation bilaterally  Neurological examination  Mentation: Alert oriented to time, place, history taking. Follows all commands speech and language fluent Cranial nerve II-XII: Extraocular movements were full, visual field were full on confrontational test Head turning and shoulder shrug  were normal and symmetric.    DIAGNOSTIC DATA (LABS, IMAGING, TESTING) - I reviewed patient records, labs, notes, testing and imaging myself where available.  Lab Results  Component Value Date   WBC 5.5 07/26/2022   HGB 15.6 07/26/2022   HCT 48.1 07/26/2022   MCV 91.8  07/26/2022   PLT 236 07/26/2022      Component Value Date/Time   NA 138 07/26/2022 0800   NA 137 10/29/2017 1307   NA 134 (L) 05/10/2017 0755   K 4.4 07/26/2022 0800   K 3.6 10/29/2017 1307   K 4.2 05/10/2017 0755   CL 105 07/26/2022 0800   CL 101 10/29/2017 1307   CO2 26 07/26/2022 0800   CO2 25 10/29/2017 1307   CO2 20 (L) 05/10/2017 0755   GLUCOSE 116 (H) 07/26/2022 0800   GLUCOSE 200 (H) 10/29/2017 1307   BUN 21 07/26/2022 0800   BUN 14 10/29/2017 1307   BUN 21.0 05/10/2017 0755   CREATININE 1.36 (H) 07/26/2022 0800   CREATININE 1.4 (H) 10/29/2017 1307   CREATININE 1.3 05/10/2017 0755   CALCIUM 11.0 (H) 07/26/2022 0800   CALCIUM 10.3 10/29/2017 1307    CALCIUM 10.3 05/10/2017 0755   PROT 6.6 07/26/2022 0800   PROT 6.5 10/29/2017 1307   PROT 6.9 05/10/2017 0755   ALBUMIN 4.5 07/26/2022 0800   ALBUMIN 3.6 10/29/2017 1307   ALBUMIN 3.9 05/10/2017 0755   AST 22 07/26/2022 0800   AST 25 05/10/2017 0755   ALT 16 07/26/2022 0800   ALT 27 10/29/2017 1307   ALT 26 05/10/2017 0755   ALKPHOS 73 07/26/2022 0800   ALKPHOS 77 10/29/2017 1307   ALKPHOS 97 05/10/2017 0755   BILITOT 0.7 07/26/2022 0800   BILITOT 0.70 05/10/2017 0755   GFRNONAA 54 (L) 07/26/2022 0800   GFRAA 53 (L) 09/02/2020 0900      ASSESSMENT AND PLAN 76 y.o. year old male  has a past medical history of Anxiety, Arthritis, Clotting disorder ([place]), CML (chronic myelocytic leukemia) ([place]) (12/16/2020), Depression, DM (diabetes mellitus) ([place]), GERD (gastroesophageal reflux disease), Hiatal hernia, History of shingles (04/03/2014), HTN (hypertension), Hyperlipidemia, Insomnia, Iron deficiency anemia due to chronic blood loss (11/20/2016), Macular degeneration, Nocturia more than twice per night (11/11/2014), Obesity, Polycythemia vera(238.4), Portal vein thrombosis, Rhinitis, Situational depression, Sleep apnea, and Tubular adenoma (12/10/2015). here with:  OSA on CPAP  - CPAP compliance excellent - Good treatment of AHI  - Encourage patient to use CPAP nightly and > 4 hours each night - F/U in 1 year or sooner if needed    Ward Givens, MSN, NP-C 08/24/2022, 2:58 PM [facility] [address] [place], [place] [address] [phone]

## 2022-08-24 ENCOUNTER — Ambulatory Visit (INDEPENDENT_AMBULATORY_CARE_PROVIDER_SITE_OTHER): Payer: Medicare Other | Admitting: Adult Health

## 2022-08-24 ENCOUNTER — Encounter: Payer: Self-pay | Admitting: Adult Health

## 2022-08-24 VITALS — BP 126/79 | HR 79 | Ht 66.0 in | Wt 179.0 lb

## 2022-08-24 DIAGNOSIS — Z9989 Dependence on other enabling machines and devices: Secondary | ICD-10-CM

## 2022-08-24 DIAGNOSIS — G4733 Obstructive sleep apnea (adult) (pediatric): Secondary | ICD-10-CM | POA: Diagnosis not present

## 2022-08-24 NOTE — Patient Instructions (Signed)
Continue using CPAP nightly and greater than 4 hours each night °If your symptoms worsen or you develop new symptoms please let us know.  ° °

## 2022-08-28 ENCOUNTER — Other Ambulatory Visit: Payer: Self-pay | Admitting: *Deleted

## 2022-08-28 DIAGNOSIS — C9210 Chronic myeloid leukemia, BCR/ABL-positive, not having achieved remission: Secondary | ICD-10-CM

## 2022-08-28 MED ORDER — DASATINIB 70 MG PO TABS: 70.0000 mg | ORAL_TABLET | Freq: Every day | ORAL | 6 refills | Status: DC

## 2022-09-13 ENCOUNTER — Other Ambulatory Visit: Payer: Self-pay

## 2022-09-13 ENCOUNTER — Inpatient Hospital Stay: Payer: Medicare Other

## 2022-09-13 ENCOUNTER — Encounter: Payer: Self-pay | Admitting: Hematology & Oncology

## 2022-09-13 ENCOUNTER — Inpatient Hospital Stay: Payer: Medicare Other | Attending: Hematology & Oncology

## 2022-09-13 ENCOUNTER — Inpatient Hospital Stay (HOSPITAL_BASED_OUTPATIENT_CLINIC_OR_DEPARTMENT_OTHER): Payer: Medicare Other | Admitting: Hematology & Oncology

## 2022-09-13 VITALS — BP 133/61 | HR 75 | Temp 98.4°F | Resp 16 | Ht 66.0 in | Wt 180.0 lb

## 2022-09-13 VITALS — BP 136/62 | HR 72 | Resp 16

## 2022-09-13 DIAGNOSIS — D45 Polycythemia vera: Secondary | ICD-10-CM | POA: Diagnosis present

## 2022-09-13 DIAGNOSIS — D500 Iron deficiency anemia secondary to blood loss (chronic): Secondary | ICD-10-CM

## 2022-09-13 DIAGNOSIS — C9210 Chronic myeloid leukemia, BCR/ABL-positive, not having achieved remission: Secondary | ICD-10-CM

## 2022-09-13 LAB — CBC WITH DIFFERENTIAL (CANCER CENTER ONLY)
Abs Immature Granulocytes: 0.06 10*3/uL (ref 0.00–0.07)
Basophils Absolute: 0 10*3/uL (ref 0.0–0.1)
Basophils Relative: 1 %
Eosinophils Absolute: 0.3 10*3/uL (ref 0.0–0.5)
Eosinophils Relative: 5 %
HCT: 51.4 % (ref 39.0–52.0)
Hemoglobin: 16.3 g/dL (ref 13.0–17.0)
Immature Granulocytes: 1 %
Lymphocytes Relative: 15 %
Lymphs Abs: 0.8 10*3/uL (ref 0.7–4.0)
MCH: 30.2 pg (ref 26.0–34.0)
MCHC: 31.7 g/dL (ref 30.0–36.0)
MCV: 95.4 fL (ref 80.0–100.0)
Monocytes Absolute: 0.7 10*3/uL (ref 0.1–1.0)
Monocytes Relative: 13 %
Neutro Abs: 3.6 10*3/uL (ref 1.7–7.7)
Neutrophils Relative %: 65 %
Platelet Count: 232 10*3/uL (ref 150–400)
RBC: 5.39 MIL/uL (ref 4.22–5.81)
RDW: 14.9 % (ref 11.5–15.5)
WBC Count: 5.4 10*3/uL (ref 4.0–10.5)
nRBC: 0 % (ref 0.0–0.2)

## 2022-09-13 LAB — CMP (CANCER CENTER ONLY)
ALT: 14 U/L (ref 0–44)
AST: 18 U/L (ref 15–41)
Albumin: 4.5 g/dL (ref 3.5–5.0)
Alkaline Phosphatase: 88 U/L (ref 38–126)
Anion gap: 7 (ref 5–15)
BUN: 24 mg/dL — ABNORMAL HIGH (ref 8–23)
CO2: 28 mmol/L (ref 22–32)
Calcium: 11.4 mg/dL — ABNORMAL HIGH (ref 8.9–10.3)
Chloride: 103 mmol/L (ref 98–111)
Creatinine: 1.53 mg/dL — ABNORMAL HIGH (ref 0.61–1.24)
GFR, Estimated: 47 mL/min — ABNORMAL LOW (ref >60)
Glucose, Bld: 168 mg/dL — ABNORMAL HIGH (ref 70–99)
Potassium: 4.2 mmol/L (ref 3.5–5.1)
Sodium: 138 mmol/L (ref 135–145)
Total Bilirubin: 0.6 mg/dL (ref 0.3–1.2)
Total Protein: 7.3 g/dL (ref 6.5–8.1)

## 2022-09-13 LAB — RETICULOCYTES
Immature Retic Fract: 14.2 % (ref 2.3–15.9)
RBC.: 5.37 MIL/uL (ref 4.22–5.81)
Retic Count, Absolute: 86.5 10*3/uL (ref 19.0–186.0)
Retic Ct Pct: 1.6 % (ref 0.4–3.1)

## 2022-09-13 LAB — IRON AND IRON BINDING CAPACITY (CC-WL,HP ONLY)
Iron: 52 ug/dL (ref 45–182)
Saturation Ratios: 12 % — ABNORMAL LOW (ref 17.9–39.5)
TIBC: 419 ug/dL (ref 250–450)
UIBC: 367 ug/dL (ref 117–376)

## 2022-09-13 LAB — FERRITIN: Ferritin: 12 ng/mL — ABNORMAL LOW (ref 24–336)

## 2022-09-13 LAB — LACTATE DEHYDROGENASE: LDH: 135 U/L (ref 98–192)

## 2022-09-13 NOTE — Patient Instructions (Signed)

## 2022-09-13 NOTE — Progress Notes (Signed)
James [name]. presents today for phlebotomy per MD orders. Phlebotomy procedure started at 0845 via phlebotomy kit and ended at 0900. 527 grams removed. Patient observed for 30 minutes after procedure without any incident. Patient tolerated procedure well. IV needle removed intact.

## 2022-09-13 NOTE — Progress Notes (Signed)
Hematology and Oncology Follow Up Visit  James [name] [id number] 12/19/1945 76 y.o. 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 IV iron as indicated - last received in March 2022        Interim History:  James [name] is here today for follow-up.  We last saw him back in August.  At that time, he had no detectable BCR/ABL fusion in his blood.  The Sprycel is working incredibly well for him.  I am just happy about this.  He is active.  He goes to the pool.  He has had no problems with bleeding.  He is on Xarelto.  He has had no problems with his iron.  When we last saw him, his ferritin was 23 with an iron saturation of 20%.  There is been no problems with fever.  He has had no nausea or vomiting.  He has had no headache.  He is completely Recovered from his shoulder surgery that he had a year ago in October.  He has had no diarrhea.  There is been no constipation.  Overall, his performance status is probably ECOG 1.   Medications:  Allergies as of 09/13/2022       Reactions   Ibuprofen Anaphylaxis, Other (See Comments)   Upper GI Bled   Nsaids Other (See Comments)   GI Bleed        Medication List        Accurate as of September 13, 2022  8:24 AM. If you have any questions, ask your nurse or doctor.          buPROPion 300 MG 24 hr tablet Commonly known as: WELLBUTRIN XL Take 300 mg by mouth every morning.   cephALEXin 500 MG capsule Commonly known as: KEFLEX Take 2,000 mg by mouth See admin instructions. Prior to dental procedure   cyanocobalamin 1000 MCG tablet Commonly known as: VITAMIN B12 Take 1,000 mcg by mouth daily.   dasatinib 70 MG tablet Commonly known as: Sprycel Take 1 tablet (70 mg total) by mouth  daily.   desmopressin 0.2 MG tablet Commonly known as: DDAVP Take 400 mcg by mouth at bedtime.   fluticasone 50 MCG/ACT nasal spray Commonly known as: FLONASE Place 1 spray into both nostrils as needed for allergies.   glucose blood test strip OneTouch Ultra Blue Test Strip  USE TO TEST BLOOD SUGAR ONCE DAILY   lidocaine 4 % Apply 1 patch topically as needed (pain). What changed: Another medication with the same name was changed. Make sure you understand how and when to take each.   lidocaine 5 % Commonly known as: Lidoderm Place 1 patch onto the skin daily. Remove & Discard patch within 12 hours or as directed by MD What changed:  when to take this reasons to take this   LORazepam 0.5 MG tablet Commonly known as: ATIVAN Take 0.5 mg by mouth at bedtime.   losartan-hydrochlorothiazide 100-12.5 MG tablet Commonly known as: HYZAAR Take 1 tablet by mouth daily.   metFORMIN 500 MG 24 hr tablet Commonly known as: GLUCOPHAGE-XR Take 500 mg by mouth 2 (two) times daily.   moxifloxacin 0.5 % ophthalmic solution Commonly known as: VIGAMOX Place 1 drop into the right eye See admin instructions. Place 1 drop  in right eye QID the day before, day of, and day after treatment.   polyethylene glycol 17 g packet Commonly known as: MIRALAX / GLYCOLAX Take 17 g by mouth as needed for moderate constipation.   PRESCRIPTION MEDICATION Inject 1 Dose as directed every 8 (eight) weeks. Gets injections in right eye at dr's office every 8 weeks   sildenafil 50 MG tablet Commonly known as: VIAGRA Take 50 mg by mouth daily as needed.   testosterone cypionate 200 MG/ML injection Commonly known as: DEPOTESTOSTERONE CYPIONATE Inject 200 mg into the muscle every 21 ( twenty-one) days.   Trulicity 1.5 EZ/6.6QH Sopn Generic drug: Dulaglutide Inject 1.5 mg into the muscle every Monday.   Xarelto 10 MG Tabs tablet Generic drug: rivaroxaban TAKE 1 TABLET BY MOUTH EVERY DAY         Allergies:  Allergies  Allergen Reactions   Ibuprofen Anaphylaxis and Other (See Comments)    Upper GI Bled   Nsaids Other (See Comments)    GI Bleed    Past Medical History, Surgical history, Social history, and Family History were reviewed and updated.  Review of Systems: Review of Systems  Constitutional: Negative.   HENT: Negative.    Eyes: Negative.   Respiratory: Negative.    Cardiovascular: Negative.   Gastrointestinal: Negative.   Genitourinary: Negative.   Musculoskeletal: Negative.   Skin: Negative.   Neurological: Negative.   Endo/Heme/Allergies: Negative.   Psychiatric/Behavioral: Negative.      Physical Exam:  height is 5' 6"  (1.676 m) and weight is 180 lb (81.6 kg). His oral temperature is 98.4 F (36.9 C). His blood pressure is 133/61 and his pulse is 75. His respiration is 16 and oxygen saturation is 94%.   Wt Readings from Last 3 Encounters:  09/13/22 180 lb (81.6 kg)  08/24/22 179 lb (81.2 kg)  07/26/22 176 lb (79.8 kg)    Physical Exam Vitals reviewed.  HENT:     Head: Normocephalic and atraumatic.  Eyes:     Pupils: Pupils are equal, round, and reactive to light.  Cardiovascular:     Rate and Rhythm: Normal rate and regular rhythm.     Heart sounds: Normal heart sounds.  Pulmonary:     Effort: Pulmonary effort is normal.     Breath sounds: Normal breath sounds.  Abdominal:     General: Bowel sounds are normal.     Palpations: Abdomen is soft.  Musculoskeletal:        General: No tenderness or deformity. Normal range of motion.     Cervical back: Normal range of motion.  Lymphadenopathy:     Cervical: No cervical adenopathy.  Skin:    General: Skin is warm and dry.     Findings: No erythema or rash.  Neurological:     Mental Status: He is alert and oriented to person, place, and time.  Psychiatric:        Behavior: Behavior normal.        Thought Content: Thought content normal.        Judgment: Judgment normal.      Lab  Results  Component Value Date   WBC 5.4 09/13/2022   HGB 16.3 09/13/2022   HCT 51.4 09/13/2022   MCV 95.4 09/13/2022   PLT 232 09/13/2022   Lab Results  Component Value Date   FERRITIN 23 (L) 07/26/2022   IRON 70 07/26/2022   TIBC 353 07/26/2022   UIBC 283 07/26/2022   IRONPCTSAT 20 07/26/2022   Lab Results  Component Value Date   RETICCTPCT 1.6 09/13/2022   RBC 5.37 09/13/2022   RBC 5.39 09/13/2022   RETICCTABS 94.0 08/30/2011   No results found for: "KPAFRELGTCHN", "LAMBDASER", "[facility]" Lab Results  Component Value Date   IGGSERUM 828 10/11/2016   IGA 177 06/22/2016   No results found for: "TOTALPROTELP", "ALBUMINELP", "A1GS", "A2GS", "BETS", "BETA2SER", "GAMS", "MSPIKE", "SPEI"   Chemistry      Component Value Date/Time   NA 138 07/26/2022 0800   NA 137 10/29/2017 1307   NA 134 (L) 05/10/2017 0755   K 4.4 07/26/2022 0800   K 3.6 10/29/2017 1307   K 4.2 05/10/2017 0755   CL 105 07/26/2022 0800   CL 101 10/29/2017 1307   CO2 26 07/26/2022 0800   CO2 25 10/29/2017 1307   CO2 20 (L) 05/10/2017 0755   BUN 21 07/26/2022 0800   BUN 14 10/29/2017 1307   BUN 21.0 05/10/2017 0755   CREATININE 1.36 (H) 07/26/2022 0800   CREATININE 1.4 (H) 10/29/2017 1307   CREATININE 1.3 05/10/2017 0755      Component Value Date/Time   CALCIUM 11.0 (H) 07/26/2022 0800   CALCIUM 10.3 10/29/2017 1307   CALCIUM 10.3 05/10/2017 0755   ALKPHOS 73 07/26/2022 0800   ALKPHOS 77 10/29/2017 1307   ALKPHOS 97 05/10/2017 0755   AST 22 07/26/2022 0800   AST 25 05/10/2017 0755   ALT 16 07/26/2022 0800   ALT 27 10/29/2017 1307   ALT 26 05/10/2017 0755   BILITOT 0.7 07/26/2022 0800   BILITOT 0.70 05/10/2017 0755     Impression and Plan: Mr. Valadez is 76 year old gentleman with a long history of polycythemia. He is JAK2 negative.  He has transformed over to [facility].  He is on Sprycel.  He is responding quite nicely.  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  I  would like to phlebotomize 1 unit of blood.  I think his blood is just a little bit too high with a hematocrit of 51%.  I think we can now get him through the holiday season.  I know that he and his family doctor try to work on his weight.  I am sure that he will be able to drop a little bit of weight before we see him again.    [name], MD 9/27/20238:24 AM

## 2022-09-20 ENCOUNTER — Other Ambulatory Visit: Payer: Self-pay | Admitting: Hematology & Oncology

## 2022-09-23 ENCOUNTER — Other Ambulatory Visit: Payer: Self-pay | Admitting: Family Medicine

## 2022-10-31 ENCOUNTER — Other Ambulatory Visit (HOSPITAL_COMMUNITY): Payer: Self-pay

## 2022-10-31 ENCOUNTER — Telehealth: Payer: Self-pay

## 2022-10-31 NOTE — Telephone Encounter (Signed)
Left VM for patient to call back to initiate re-enrollment.  [name], [facility] Patient [place]  [phone] (phone) [phone] (fax) 10/31/2022 1:37 PM

## 2022-10-31 NOTE — Telephone Encounter (Signed)
[name] Encounter   Received notification that patient is due for re-enrollment for assistance for Sprycel through [employer].   Re-enrollment process has been initiated and will be submitted upon completion of necessary documents.  BMS' phone number [phone].   I will continue to follow until final determination.  [name], [facility] Patient [place]  [phone] (phone) [phone] (fax) 10/31/2022 10:03 AM

## 2022-11-21 ENCOUNTER — Ambulatory Visit: Payer: Medicare Other | Admitting: Sports Medicine

## 2022-11-27 NOTE — Telephone Encounter (Signed)
[name] Encounter   Submitted application for assistance for Sprycel to [employer].   Application submitted via e-fax to [phone]   BMS' phone number [phone].   I will continue to check the status until final determination.   James [name], [facility] Patient [place]  [phone] (phone) [phone] (fax) 11/27/2022 8:45 AM

## 2022-12-27 ENCOUNTER — Encounter: Payer: Self-pay | Admitting: Hematology & Oncology

## 2022-12-27 ENCOUNTER — Inpatient Hospital Stay (HOSPITAL_BASED_OUTPATIENT_CLINIC_OR_DEPARTMENT_OTHER): Payer: Medicare Other | Admitting: Hematology & Oncology

## 2022-12-27 ENCOUNTER — Inpatient Hospital Stay: Payer: Medicare Other

## 2022-12-27 ENCOUNTER — Inpatient Hospital Stay: Payer: Medicare Other | Attending: Hematology & Oncology

## 2022-12-27 VITALS — BP 142/66 | HR 88 | Temp 97.8°F | Resp 17 | Wt 177.0 lb

## 2022-12-27 DIAGNOSIS — Z7901 Long term (current) use of anticoagulants: Secondary | ICD-10-CM | POA: Diagnosis not present

## 2022-12-27 DIAGNOSIS — C9210 Chronic myeloid leukemia, BCR/ABL-positive, not having achieved remission: Secondary | ICD-10-CM | POA: Insufficient documentation

## 2022-12-27 DIAGNOSIS — I81 Portal vein thrombosis: Secondary | ICD-10-CM | POA: Insufficient documentation

## 2022-12-27 DIAGNOSIS — Z79899 Other long term (current) drug therapy: Secondary | ICD-10-CM | POA: Insufficient documentation

## 2022-12-27 LAB — CMP (CANCER CENTER ONLY)
ALT: 15 U/L (ref 0–44)
AST: 20 U/L (ref 15–41)
Albumin: 4.4 g/dL (ref 3.5–5.0)
Alkaline Phosphatase: 99 U/L (ref 38–126)
Anion gap: 7 (ref 5–15)
BUN: 19 mg/dL (ref 8–23)
CO2: 28 mmol/L (ref 22–32)
Calcium: 11.3 mg/dL — ABNORMAL HIGH (ref 8.9–10.3)
Chloride: 105 mmol/L (ref 98–111)
Creatinine: 1.53 mg/dL — ABNORMAL HIGH (ref 0.61–1.24)
GFR, Estimated: 47 mL/min — ABNORMAL LOW (ref >60)
Glucose, Bld: 160 mg/dL — ABNORMAL HIGH (ref 70–99)
Potassium: 4.6 mmol/L (ref 3.5–5.1)
Sodium: 140 mmol/L (ref 135–145)
Total Bilirubin: 0.6 mg/dL (ref 0.3–1.2)
Total Protein: 7.2 g/dL (ref 6.5–8.1)

## 2022-12-27 LAB — CBC WITH DIFFERENTIAL (CANCER CENTER ONLY)
Abs Immature Granulocytes: 0.09 10*3/uL — ABNORMAL HIGH (ref 0.00–0.07)
Basophils Absolute: 0 10*3/uL (ref 0.0–0.1)
Basophils Relative: 1 %
Eosinophils Absolute: 0.2 10*3/uL (ref 0.0–0.5)
Eosinophils Relative: 3 %
HCT: 44.9 % (ref 39.0–52.0)
Hemoglobin: 14.7 g/dL (ref 13.0–17.0)
Immature Granulocytes: 1 %
Lymphocytes Relative: 14 %
Lymphs Abs: 0.9 10*3/uL (ref 0.7–4.0)
MCH: 29.3 pg (ref 26.0–34.0)
MCHC: 32.7 g/dL (ref 30.0–36.0)
MCV: 89.6 fL (ref 80.0–100.0)
Monocytes Absolute: 1 10*3/uL (ref 0.1–1.0)
Monocytes Relative: 15 %
Neutro Abs: 4.4 10*3/uL (ref 1.7–7.7)
Neutrophils Relative %: 66 %
Platelet Count: 258 10*3/uL (ref 150–400)
RBC: 5.01 MIL/uL (ref 4.22–5.81)
RDW: 19.8 % — ABNORMAL HIGH (ref 11.5–15.5)
WBC Count: 6.7 10*3/uL (ref 4.0–10.5)
nRBC: 0 % (ref 0.0–0.2)

## 2022-12-27 LAB — RETICULOCYTES
Immature Retic Fract: 22.7 % — ABNORMAL HIGH (ref 2.3–15.9)
RBC.: 5.07 MIL/uL (ref 4.22–5.81)
Retic Count, Absolute: 138.4 10*3/uL (ref 19.0–186.0)
Retic Ct Pct: 2.7 % (ref 0.4–3.1)

## 2022-12-27 LAB — FERRITIN: Ferritin: 17 ng/mL — ABNORMAL LOW (ref 24–336)

## 2022-12-27 NOTE — Progress Notes (Signed)
Hematology and Oncology Follow Up Visit  Adrienne Delay [id number] [birth year] 77 y.o. 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 IV iron as indicated - last received in March 2022        Interim History:  Mr. Quinby is here today for follow-up.  We last saw him back in September.  He has been doing pretty well.  There has been no complaints.  He has tolerated Sprycel.  His last BCR/ABL was not detectable.  He continues on Xarelto because of portal vein thrombus.  He has had no problems with bleeding.  He has had no issues with nausea or vomiting.  There is been no change in bowel or bladder habits.  He has had no cough or shortness of breath.  He enjoyed the [place] season.  He was with his family.  His last iron studies that were done back in September showed a ferritin of 12 with an iron saturation of 12%.  He has recovered nicely from the shoulder surgery that he had over a year ago.  Currently, his performance status is ECOG 0.   Medications:  Allergies as of 12/27/2022       Reactions   Ibuprofen Anaphylaxis, Other (See Comments)   Upper GI Bled   Nsaids Other (See Comments)   GI Bleed        Medication List        Accurate as of December 27, 2022  2:15 PM. If you have any questions, ask your nurse or doctor.          buPROPion 300 MG 24 hr tablet Commonly known as: WELLBUTRIN XL Take 300 mg by mouth every morning.   cephALEXin 500 MG capsule Commonly known as: KEFLEX Take 2,000 mg by mouth See admin instructions. Prior to dental procedure   cyanocobalamin 1000 MCG tablet Commonly known as: VITAMIN B12 Take 1,000 mcg by mouth daily.   dasatinib 70 MG tablet Commonly known as: Sprycel Take 1 tablet (70 mg total) by  mouth daily.   desmopressin 0.2 MG tablet Commonly known as: DDAVP Take 400 mcg by mouth at bedtime.   fluticasone 50 MCG/ACT nasal spray Commonly known as: FLONASE Place 1 spray into both nostrils as needed for allergies.   glucose blood test strip OneTouch Ultra Blue Test Strip  USE TO TEST BLOOD SUGAR ONCE DAILY   lidocaine 4 % Apply 1 patch topically as needed (pain). What changed: Another medication with the same name was changed. Make sure you understand how and when to take each.   lidocaine 5 % Commonly known as: Lidoderm Place 1 patch onto the skin daily. Remove & Discard patch within 12 hours or as directed by MD What changed:  when to take this reasons to take this   LORazepam 0.5 MG tablet Commonly known as: ATIVAN Take 0.5 mg by mouth at bedtime.   losartan-hydrochlorothiazide 100-12.5 MG tablet Commonly known as: HYZAAR Take 1 tablet by mouth daily.   metFORMIN 500 MG 24 hr tablet Commonly known as: GLUCOPHAGE-XR Take 500 mg by mouth 2 (two) times daily.   moxifloxacin 0.5 % ophthalmic solution Commonly known as: VIGAMOX Place 1 drop into the right eye See admin instructions. Place 1 drop in right eye QID the  day before, day of, and day after treatment.   polyethylene glycol 17 g packet Commonly known as: MIRALAX / GLYCOLAX Take 17 g by mouth as needed for moderate constipation.   PRESCRIPTION MEDICATION Inject 1 Dose as directed every 8 (eight) weeks. Gets injections in right eye at dr's office every 8 weeks   sildenafil 50 MG tablet Commonly known as: VIAGRA Take 50 mg by mouth daily as needed.   testosterone cypionate 200 MG/ML injection Commonly known as: DEPOTESTOSTERONE CYPIONATE Inject 200 mg into the muscle every 21 ( twenty-one) days.   Trulicity 1.5 GY/5.6LS Sopn Generic drug: Dulaglutide Inject 1.5 mg into the muscle every Monday.   Xarelto 10 MG Tabs tablet Generic drug: rivaroxaban TAKE 1 TABLET BY MOUTH EVERY DAY         Allergies:  Allergies  Allergen Reactions   Ibuprofen Anaphylaxis and Other (See Comments)    Upper GI Bled   Nsaids Other (See Comments)    GI Bleed    Past Medical History, Surgical history, Social history, and Family History were reviewed and updated.  Review of Systems: Review of Systems  Constitutional: Negative.   HENT: Negative.    Eyes: Negative.   Respiratory: Negative.    Cardiovascular: Negative.   Gastrointestinal: Negative.   Genitourinary: Negative.   Musculoskeletal: Negative.   Skin: Negative.   Neurological: Negative.   Endo/Heme/Allergies: Negative.   Psychiatric/Behavioral: Negative.      Physical Exam:  weight is 177 lb (80.3 kg). His oral temperature is 97.8 F (36.6 C). His blood pressure is 142/66 (abnormal) and his pulse is 88. His respiration is 17 and oxygen saturation is 97%.   Wt Readings from Last 3 Encounters:  12/27/22 177 lb (80.3 kg)  09/13/22 180 lb (81.6 kg)  08/24/22 179 lb (81.2 kg)    Physical Exam Vitals reviewed.  HENT:     Head: Normocephalic and atraumatic.  Eyes:     Pupils: Pupils are equal, round, and reactive to light.  Cardiovascular:     Rate and Rhythm: Normal rate and regular rhythm.     Heart sounds: Normal heart sounds.  Pulmonary:     Effort: Pulmonary effort is normal.     Breath sounds: Normal breath sounds.  Abdominal:     General: Bowel sounds are normal.     Palpations: Abdomen is soft.  Musculoskeletal:        General: No tenderness or deformity. Normal range of motion.     Cervical back: Normal range of motion.  Lymphadenopathy:     Cervical: No cervical adenopathy.  Skin:    General: Skin is warm and dry.     Findings: No erythema or rash.  Neurological:     Mental Status: He is alert and oriented to person, place, and time.  Psychiatric:        Behavior: Behavior normal.        Thought Content: Thought content normal.        Judgment: Judgment normal.      Lab Results  Component  Value Date   WBC 6.7 12/27/2022   HGB 14.7 12/27/2022   HCT 44.9 12/27/2022   MCV 89.6 12/27/2022   PLT 258 12/27/2022   Lab Results  Component Value Date   FERRITIN 12 (L) 09/13/2022   IRON 52 09/13/2022   TIBC 419 09/13/2022   UIBC 367 09/13/2022   IRONPCTSAT 12 (L) 09/13/2022   Lab Results  Component Value Date   RETICCTPCT 2.7 12/27/2022  RBC 5.07 12/27/2022   RETICCTABS 94.0 08/30/2011   No results found for: "KPAFRELGTCHN", "LAMBDASER", "[facility]" Lab Results  Component Value Date   IGGSERUM 828 10/11/2016   IGA 177 06/22/2016   No results found for: "TOTALPROTELP", "ALBUMINELP", "A1GS", "A2GS", "BETS", "BETA2SER", "GAMS", "MSPIKE", "SPEI"   Chemistry      Component Value Date/Time   NA 140 12/27/2022 1326   NA 137 10/29/2017 1307   NA 134 (L) 05/10/2017 0755   K 4.6 12/27/2022 1326   K 3.6 10/29/2017 1307   K 4.2 05/10/2017 0755   CL 105 12/27/2022 1326   CL 101 10/29/2017 1307   CO2 28 12/27/2022 1326   CO2 25 10/29/2017 1307   CO2 20 (L) 05/10/2017 0755   BUN 19 12/27/2022 1326   BUN 14 10/29/2017 1307   BUN 21.0 05/10/2017 0755   CREATININE 1.53 (H) 12/27/2022 1326   CREATININE 1.4 (H) 10/29/2017 1307   CREATININE 1.3 05/10/2017 0755      Component Value Date/Time   CALCIUM 11.3 (H) 12/27/2022 1326   CALCIUM 10.3 10/29/2017 1307   CALCIUM 10.3 05/10/2017 0755   ALKPHOS 99 12/27/2022 1326   ALKPHOS 77 10/29/2017 1307   ALKPHOS 97 05/10/2017 0755   AST 20 12/27/2022 1326   AST 25 05/10/2017 0755   ALT 15 12/27/2022 1326   ALT 27 10/29/2017 1307   ALT 26 05/10/2017 0755   BILITOT 0.6 12/27/2022 1326   BILITOT 0.70 05/10/2017 0755     Impression and Plan: Mr. Kuang is 77 year old gentleman with a long history of polycythemia. He is JAK2 negative.  He has transformed over to [facility].  He is on Sprycel.  He is responding quite nicely.  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  He does not need to be phlebotomized.  I  realize his hematocrit is close to 45%.  He feels well.  I does think that we might cause more problems with respect to phlebotomize him and get his hemoglobin too low.  I would like to see him back in another 3 months.  I think this would be very reasonable.    [name], MD 1/10/20242:15 PM

## 2022-12-28 LAB — IRON AND IRON BINDING CAPACITY (CC-WL,HP ONLY)
Iron: 62 ug/dL (ref 45–182)
Saturation Ratios: 18 % (ref 17.9–39.5)
TIBC: 346 ug/dL (ref 250–450)
UIBC: 284 ug/dL (ref 117–376)

## 2023-01-02 ENCOUNTER — Telehealth: Payer: Self-pay

## 2023-01-02 LAB — BCR/ABL

## 2023-01-02 NOTE — Telephone Encounter (Signed)
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 Message stated that if patient has any concerns or questions to call the clinic back.

## 2023-01-02 NOTE — Telephone Encounter (Signed)
-----  Message from [name], MD sent at 01/02/2023  1:44 PM EST ----- Please call and let him know that there is no detectable chronic leukemia in his blood.  Happy new year.  Thanks.  Laurey Arrow

## 2023-01-03 NOTE — Telephone Encounter (Signed)
Received notification that Benefits Investigation has been concluded and application is now under review. I will continue to follow and update until final determination.   James [name], [facility] Patient [place]  [phone] (phone) [phone] (fax) 01/03/2023 7:56 AM

## 2023-01-09 ENCOUNTER — Other Ambulatory Visit (HOSPITAL_COMMUNITY): Payer: Self-pay

## 2023-01-09 ENCOUNTER — Encounter: Payer: Self-pay | Admitting: Hematology & Oncology

## 2023-01-10 ENCOUNTER — Other Ambulatory Visit (HOSPITAL_COMMUNITY): Payer: Self-pay

## 2023-01-10 NOTE — Telephone Encounter (Signed)
Received notification from [employer]MS that they required a copy of Test Claim. Test Claim captured and faxed to [phone]. I will continue to follow and update until final determination.   Phone Number for Follow-Up [phone]  James [name], [place] Patient [place]  [phone] (phone) [phone] (fax) 01/10/2023 10:06 AM

## 2023-01-12 NOTE — Telephone Encounter (Signed)
Called to check status of application. Confirmed with representative that they received copy of test claim and have proceeded with review. I will continue to follow and update until final determination.   [name], [facility] Patient [place]  [phone] (phone) [phone] (fax) 01/12/2023 10:23 AM

## 2023-01-15 NOTE — Telephone Encounter (Signed)
Received notification from BMS that patient needed to complete Medicare LIS Screen with them. I called and informed patient. They are calling to complete. I will continue to follow and update until final determination.   [name], [name]  [phone] (phone) [phone] (fax) 01/15/2023 10:57 AM

## 2023-01-17 ENCOUNTER — Other Ambulatory Visit (HOSPITAL_COMMUNITY): Payer: Self-pay

## 2023-01-17 NOTE — Telephone Encounter (Signed)
Called to check status of application. Informed by representative that patient still needed to complete Medicare Questionnaire due to new Medicare changes for 2024. I asked if I could provide that information and was able to complete with representative. Application has now been sent to [place] for final review. I will continue to follow and update until final determination.   [employer] Phone #: [place], [place] Patient [place]  [phone] (phone) [phone] (fax) 01/17/2023 9:27 AM

## 2023-01-19 NOTE — Telephone Encounter (Signed)
Received signed prescription and faxed to [employer] at [phone]. I will continue to follow and update until final determination.   [name], [facility] Patient [place]  [phone] (phone) [phone] (fax) 01/19/2023 1:01 PM

## 2023-01-19 NOTE — Telephone Encounter (Signed)
Called to check status of application. Informed by representative it was still in processing at [facility]. I was able to get a superviosr involved and had the case escalated. Supervisor recommended sending in rx so they could process without any further delays. Rx was sent to MD Office for signature and will be faxed back to [phone] once MD signature is received. I will continue to follow and update until final determination.   [name], [facility] Patient [place]  [phone] (phone) [phone] (fax) 01/19/2023 11:04 AM

## 2023-01-23 ENCOUNTER — Other Ambulatory Visit (HOSPITAL_COMMUNITY): Payer: Self-pay

## 2023-01-23 NOTE — Telephone Encounter (Signed)
Called to check status of application. Informed everything needed was in hand and is now being expedited for final processing. I will continue to follow and update until final determination.   James [name], [facility] Patient [place]  [phone] (phone) [phone] (fax) 01/23/2023 8:22 AM

## 2023-01-23 NOTE — Telephone Encounter (Signed)
Patient will be dropping off OOP receipt to MD Office to send to me. Patient knows to call me as soon as he drops them off so I can coordinate with office to get receipts over to me for processing. I will continue to follow and update until final determination.   [name], [facility] Patient [place]  [phone] (phone) [phone] (fax) 01/23/2023 12:24 PM

## 2023-01-23 NOTE — Telephone Encounter (Signed)
Received notification from [facility] that patient did not meet initial financial requirements. Informed that we needed to send in receipt(s) showing $299 or more to qualify. I am reaching out to patient to see about obtaining documents. I will continue to follow and update until final determination.   [name], [facility] Patient [place]  [phone] (phone) [phone] (fax) 01/23/2023 11:51 AM

## 2023-01-23 NOTE — Telephone Encounter (Signed)
Received Out of Pocket Expenses and faxed to [place] at [phone] for processing. I will continue to follow and update until final determination.   James [name], [name]  [phone] (phone) [phone] (fax) 01/23/2023 3:11 PM

## 2023-01-24 ENCOUNTER — Other Ambulatory Visit: Payer: Self-pay | Admitting: *Deleted

## 2023-01-24 DIAGNOSIS — C9210 Chronic myeloid leukemia, BCR/ABL-positive, not having achieved remission: Secondary | ICD-10-CM

## 2023-01-24 MED ORDER — DASATINIB 70 MG PO TABS: 70.0000 mg | ORAL_TABLET | Freq: Every day | ORAL | 12 refills | Status: DC

## 2023-01-26 ENCOUNTER — Encounter (HOSPITAL_BASED_OUTPATIENT_CLINIC_OR_DEPARTMENT_OTHER): Payer: Self-pay | Admitting: Emergency Medicine

## 2023-01-26 ENCOUNTER — Other Ambulatory Visit: Payer: Self-pay

## 2023-01-26 ENCOUNTER — Emergency Department (HOSPITAL_BASED_OUTPATIENT_CLINIC_OR_DEPARTMENT_OTHER)
Admission: EM | Admit: 2023-01-26 | Discharge: 2023-01-27 | Disposition: A | Discharge status: Home / Self Care | Payer: Medicare Other | Attending: Emergency Medicine | Admitting: Emergency Medicine

## 2023-01-26 ENCOUNTER — Emergency Department (HOSPITAL_BASED_OUTPATIENT_CLINIC_OR_DEPARTMENT_OTHER): Payer: Medicare Other

## 2023-01-26 DIAGNOSIS — R5383 Other fatigue: Secondary | ICD-10-CM | POA: Insufficient documentation

## 2023-01-26 DIAGNOSIS — Z7901 Long term (current) use of anticoagulants: Secondary | ICD-10-CM | POA: Diagnosis not present

## 2023-01-26 DIAGNOSIS — R262 Difficulty in walking, not elsewhere classified: Secondary | ICD-10-CM | POA: Insufficient documentation

## 2023-01-26 DIAGNOSIS — Z794 Long term (current) use of insulin: Secondary | ICD-10-CM | POA: Diagnosis not present

## 2023-01-26 DIAGNOSIS — R410 Disorientation, unspecified: Secondary | ICD-10-CM | POA: Insufficient documentation

## 2023-01-26 DIAGNOSIS — Z86718 Personal history of other venous thrombosis and embolism: Secondary | ICD-10-CM | POA: Diagnosis not present

## 2023-01-26 DIAGNOSIS — R42 Dizziness and giddiness: Secondary | ICD-10-CM | POA: Diagnosis not present

## 2023-01-26 LAB — CBC
HCT: 44.8 % (ref 39.0–52.0)
Hemoglobin: 14.9 g/dL (ref 13.0–17.0)
MCH: 30.8 pg (ref 26.0–34.0)
MCHC: 33.3 g/dL (ref 30.0–36.0)
MCV: 92.6 fL (ref 80.0–100.0)
Platelets: 291 10*3/uL (ref 150–400)
RBC: 4.84 MIL/uL (ref 4.22–5.81)
RDW: 18.3 % — ABNORMAL HIGH (ref 11.5–15.5)
WBC: 6.3 10*3/uL (ref 4.0–10.5)
nRBC: 0 % (ref 0.0–0.2)

## 2023-01-26 LAB — HEPATIC FUNCTION PANEL
ALT: 18 U/L (ref 0–44)
AST: 30 U/L (ref 15–41)
Albumin: 3.9 g/dL (ref 3.5–5.0)
Alkaline Phosphatase: 85 U/L (ref 38–126)
Bilirubin, Direct: 0.1 mg/dL (ref 0.0–0.2)
Indirect Bilirubin: 0.7 mg/dL (ref 0.3–0.9)
Total Bilirubin: 0.8 mg/dL (ref 0.3–1.2)
Total Protein: 7.1 g/dL (ref 6.5–8.1)

## 2023-01-26 LAB — URINALYSIS, MICROSCOPIC (REFLEX)

## 2023-01-26 LAB — URINALYSIS, ROUTINE W REFLEX MICROSCOPIC
Bilirubin Urine: NEGATIVE
Glucose, UA: NEGATIVE mg/dL
Hgb urine dipstick: NEGATIVE
Ketones, ur: NEGATIVE mg/dL
Nitrite: NEGATIVE
Protein, ur: 30 mg/dL — AB
Specific Gravity, Urine: 1.025 (ref 1.005–1.030)
pH: 5.5 (ref 5.0–8.0)

## 2023-01-26 LAB — BASIC METABOLIC PANEL
Anion gap: 8 (ref 5–15)
BUN: 25 mg/dL — ABNORMAL HIGH (ref 8–23)
CO2: 22 mmol/L (ref 22–32)
Calcium: 10 mg/dL (ref 8.9–10.3)
Chloride: 102 mmol/L (ref 98–111)
Creatinine, Ser: 1.32 mg/dL — ABNORMAL HIGH (ref 0.61–1.24)
GFR, Estimated: 56 mL/min — ABNORMAL LOW (ref >60)
Glucose, Bld: 138 mg/dL — ABNORMAL HIGH (ref 70–99)
Potassium: 3.9 mmol/L (ref 3.5–5.1)
Sodium: 132 mmol/L — ABNORMAL LOW (ref 135–145)

## 2023-01-26 MED ORDER — LACTATED RINGERS IV BOLUS
500.0000 mL | Freq: Once | INTRAVENOUS | Status: AC
  Administered 2023-01-27: 500 mL via INTRAVENOUS

## 2023-01-26 NOTE — ED Triage Notes (Signed)
Patient arrived via POV c/o possible UTI. Patient family states urinary frequency w/ some increased argumentative behavior. Patient is AO x 4, VS WDL, slow gait.

## 2023-01-26 NOTE — ED Provider Notes (Signed)
[facility] Provider Note   CSN: [id number] Arrival date & time: 01/26/23  1938     History  Chief Complaint  Patient presents with   Urinary Frequency    Elim Piel. is a 77 y.o. male.  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  Patient says that over the past 3 to 4 days he has had dizziness.  Says it is worsened with leaning over and with head movement.  Also reports that he has had difficulty walking and wife notices a shuffling gait and increased fatigue recently.  Says that he has also been confused recently and has been more "snappy" than usual.  No new medications.  Was concerned about a urinary tract infection but he denies any dysuria or frequency.  No fevers, cough, runny nose or sore throat.  No headache.  No chest pain or shortness of breath.       Home Medications Prior to Admission medications   Medication Sig Start Date End Date Taking? Authorizing Provider  buPROPion (WELLBUTRIN XL) 300 MG 24 hr tablet Take 300 mg by mouth every morning. 07/20/20   [provider]  cephALEXin (KEFLEX) 500 MG capsule Take 2,000 mg by mouth See admin instructions. Prior to dental procedure    [provider]  dasatinib (SPRYCEL) 70 MG tablet Take 1 tablet (70 mg total) by mouth daily. 01/24/23   [name], MD  desmopressin (DDAVP) 0.2 MG tablet Take 400 mcg by mouth at bedtime. 12/28/21   [provider]  fluticasone (FLONASE) 50 MCG/ACT nasal spray Place 1 spray into both nostrils as needed for allergies. 10/23/19   [provider]  glucose blood test strip OneTouch Ultra Blue Test Strip  USE TO TEST BLOOD SUGAR ONCE DAILY    [provider]  lidocaine (LIDODERM) 5 % Place 1 patch onto the skin daily. Remove & Discard patch within 12 hours  or as directed by MD Patient taking differently: Place 1 patch onto the skin as needed. Remove & Discard patch within 12 hours or as directed by MD 11/02/21   Dene Gentry, MD  Lidocaine 4 % PTCH Apply 1 patch topically as needed (pain). Patient not taking: Reported on 12/27/2022    [provider]  LORazepam (ATIVAN) 0.5 MG tablet Take 0.5 mg by mouth at bedtime. Patient not taking: Reported on 12/27/2022 11/01/18   [provider]  losartan-hydrochlorothiazide (HYZAAR) 100-12.5 MG tablet Take 1 tablet by mouth daily. 01/23/22   [provider]  metFORMIN (GLUCOPHAGE-XR) 500 MG 24 hr tablet Take 500 mg by mouth 2 (two) times daily. 11/08/20   [provider]  moxifloxacin (VIGAMOX) 0.5 % ophthalmic solution Place 1 drop into the right eye See admin instructions. Place 1 drop in right eye QID the day before, day of, and day after treatment. 02/15/18   [provider]  polyethylene glycol (MIRALAX / GLYCOLAX) 17 g packet Take 17 g by mouth as needed for moderate constipation. Patient not taking: Reported on 12/27/2022    [provider]  PRESCRIPTION MEDICATION Inject 1 Dose as directed every 8 (eight) weeks. Gets injections in right eye at dr's office every 8 weeks    [provider]  sildenafil (VIAGRA) 50 MG tablet Take 50 mg by mouth daily as needed. 10/26/14   [provider]  testosterone cypionate (DEPOTESTOSTERONE CYPIONATE) 200 MG/ML injection Inject 200 mg into the muscle every 21 ( twenty-one) days.    [provider]  TRULICITY 1.5 0000000 SOPN Inject 1.5 mg into the muscle every Monday. Patient not taking: Reported on 12/27/2022 05/21/20   [provider]  vitamin B-12 (CYANOCOBALAMIN) 1000 MCG tablet Take 1,000 mcg by mouth daily.    [provider]  XARELTO 10 MG TABS tablet TAKE 1 TABLET BY MOUTH EVERY DAY 09/20/22   [name], MD      Allergies    Ibuprofen and Nsaids    Review of  Systems   Review of Systems  Physical Exam Updated Vital Signs BP 114/80   Pulse 78   Temp 98.8 F (37.1 C) (Oral)   Resp 16   Ht 5' 6"$  (1.676 m)   Wt 78 kg   SpO2 94%   BMI 27.76 kg/m  Physical Exam Vitals and nursing note reviewed.  Constitutional:      General: He is not in acute distress.    Appearance: He is well-developed.  HENT:     Head: Normocephalic and atraumatic.     Right Ear: External ear normal.     Left Ear: External ear normal.     Nose: Nose normal.  Eyes:     Extraocular Movements: Extraocular movements intact.     Conjunctiva/sclera: Conjunctivae normal.     Pupils: Pupils are equal, round, and reactive to light.  Cardiovascular:     Rate and Rhythm: Normal rate and regular rhythm.     Heart sounds: Normal heart sounds.  Pulmonary:     Effort: Pulmonary effort is normal. No respiratory distress.     Breath sounds: Normal breath sounds.  Abdominal:     Palpations: Abdomen is soft.  Musculoskeletal:     Cervical back: Normal range of motion and neck supple.     Right lower leg: No edema.     Left lower leg: No edema.  Skin:    General: Skin is warm and dry.  Neurological:     Mental Status: He is alert. Mental status is at baseline.     Comments: Negative Dix-Hallpike bilaterally MENTAL STATUS: AAOx3 CRANIAL NERVES: II: Pupils equal and reactive 5 mm BL, no RAPD, no VF deficits III, IV, VI: EOM intact, no gaze preference or deviation, no nystagmus. V: normal sensation to light touch in V1, V2, and V3 segments bilaterally VII: no facial weakness or asymmetry, no nasolabial fold flattening VIII: normal hearing to speech and finger friction IX, X: normal palatal elevation, no uvular deviation XI: 5/5 head turn and 5/5 shoulder shrug bilaterally XII: midline tongue protrusion MOTOR: 5/5 strength in R shoulder flexion, elbow flexion and extension, and grip strength. 5/5 strength in L shoulder flexion, elbow flexion and extension, and grip  strength.  5/5 strength in R hip and knee flexion, knee extension, ankle plantar and dorsiflexion. 5/5 strength in L hip and knee flexion, knee extension, ankle plantar and dorsiflexion. SENSORY: Normal sensation to light touch in all extremities COORD: Normal finger to nose and heel to shin, no tremor, no dysmetria GAIT: Shuffling gait   Psychiatric:        Mood and Affect: Mood normal.        Behavior: Behavior normal.     ED Results / Procedures / Treatments   Labs (all labs ordered are listed, but only abnormal results are displayed) Labs Reviewed  URINALYSIS, ROUTINE W REFLEX MICROSCOPIC -  Abnormal; Notable for the following components:      Result Value   Protein, ur 30 (*)    Leukocytes,Ua TRACE (*)    All other components within normal limits  BASIC METABOLIC PANEL - Abnormal; Notable for the following components:   Sodium 132 (*)    Glucose, Bld 138 (*)    BUN 25 (*)    Creatinine, Ser 1.32 (*)    GFR, Estimated 56 (*)    All other components within normal limits  CBC - Abnormal; Notable for the following components:   RDW 18.3 (*)    All other components within normal limits  URINALYSIS, MICROSCOPIC (REFLEX) - Abnormal; Notable for the following components:   Bacteria, UA FEW (*)    All other components within normal limits  HEPATIC FUNCTION PANEL  AMMONIA    EKG None  Radiology CT Head Wo Contrast  Result Date: 01/26/2023 CLINICAL DATA:  Dizziness, difficulty walking confusion. EXAM: CT HEAD WITHOUT CONTRAST TECHNIQUE: Contiguous axial images were obtained from the base of the skull through the vertex without intravenous contrast. RADIATION DOSE REDUCTION: This exam was performed according to the departmental dose-optimization program which includes automated exposure control, adjustment of the mA and/or kV according to patient size and/or use of iterative reconstruction technique. COMPARISON:  CT examination dated September 02, 2021 FINDINGS: Brain: No evidence  of acute infarction, hemorrhage, hydrocephalus, extra-axial collection or mass lesion/mass effect. Patchy area of low-attenuation of the periventricular white matter presumed chronic microvascular ischemic changes, unchanged. Vascular: No hyperdense vessel or unexpected calcification. Skull: Normal. Negative for fracture or focal lesion. Sinuses/Orbits: No acute finding. Other: None. IMPRESSION: 1. No acute intracranial abnormality. 2. Chronic microvascular ischemic changes of the white matter. Electronically Signed   By: Keane Police D.O.   On: 01/26/2023 23:37    Procedures Procedures   Medications Ordered in ED Medications  lactated ringers bolus 500 mL (500 mLs Intravenous New Bag/Given 01/27/23 0001)    ED Course/ Medical Decision Making/ A&P Clinical Course as of 01/27/23 0008  Fri Jan 26, 2023  2353 Signed out to Dr [employer].  [RP]    Clinical Course User Index [RP] Fransico Meadow, MD                             Medical Decision Making Amount and/or Complexity of Data Reviewed Labs: ordered. Radiology: ordered.   Rhyon Ramsier. is a 77 y.o. male with comorbidities that complicate the patient evaluation including CML currently on treatment, portal vein thrombus on Xarelto, polycythemia vera, and NASH who presents to the emergency department with 3 to 4 days of confusion and dizziness with a shuffling gait on exam  Initial Ddx:  ICH, stroke, normal pressure hydrocephalus, urinary tract infection, medication side effect, BPPV, orthostasis/dehydration, hepatic encephalopathy  MDM:  Unclear was causing patient's symptoms at this time.  With his shuffling gait and confusion could potentially have normal pressure hydrocephalus.  Also on the differential be urinary tract infection but this would not explain his gait abnormalities though could explain his behavioral changes.  Does have a history of [name] so we will obtain ammonia to assess for hepatic encephalopathy.  With his [facility] use  will get head CT to evaluate for ICH.  This may show old stroke as well.  Plan:  Labs Ammonia Urinalysis CT head IV fluids EKG  ED Summary/Re-evaluation:  Initial labs showed CKD and normal white blood cell count and hemoglobin.  Urinalysis showed few bacteria with trace leukocyte Estrace but no other signs of infection.  CT head without acute abnormality.  Patient is awaiting ammonia, hepatic panel, and EKG at this time.  Feel the patient may require transfer to [facility] for MRI if above workup does not reveal cause of his symptoms.  This patient presents to the ED for concern of complaints listed in HPI, this involves an extensive number of treatment options, and is a complaint that carries with it a high risk of complications and morbidity. Disposition including potential need for admission considered.   Dispo: Pending remainder of workup  Additional history obtained from significant other Records reviewed Outpatient Clinic Notes The following labs were independently interpreted: Chemistry and show CKD I independently reviewed the following imaging with scope of interpretation limited to determining acute life threatening conditions related to emergency care: CT Head and agree with the radiologist interpretation with the following exceptions: none I personally reviewed and interpreted cardiac monitoring: normal sinus rhythm  I personally reviewed and interpreted the pt's EKG: see above for interpretation  I have reviewed the patients home medications and made adjustments as needed  Final Clinical Impression(s) / ED Diagnoses Final diagnoses:  Difficulty walking  Dizziness    Rx / DC Orders ED Discharge Orders     None         Fransico Meadow, MD 01/27/23 0008

## 2023-01-27 ENCOUNTER — Emergency Department (HOSPITAL_COMMUNITY): Payer: Medicare Other

## 2023-01-27 DIAGNOSIS — R262 Difficulty in walking, not elsewhere classified: Secondary | ICD-10-CM | POA: Diagnosis not present

## 2023-01-27 LAB — AMMONIA: Ammonia: 25 umol/L (ref 9–35)

## 2023-01-27 NOTE — ED Triage Notes (Signed)
Pt arrives via POV from [facility] for MRI d/t concerns for gait instability, dizziness, and confusion x 3 days. Pt currently on Xarelto.

## 2023-01-27 NOTE — Discharge Instructions (Addendum)
You were seen in the ER today for your dizziness. Your workup was reassuring; there is no sign of stroke on your MRI. While the exact cause of your symptoms remain unclear, there does not appear to be any emergent problem at this time. Please follow up with your primary care doctor and the neurologist listed below, and return to the ER with any new severe symptoms.

## 2023-01-27 NOTE — ED Provider Notes (Signed)
Nursing notes and vitals signs, including pulse oximetry, reviewed.  Summary of this visit's results, reviewed by myself:  EKG:  EKG Interpretation  Date/Time:    Ventricular Rate:    PR Interval:    QRS Duration:   QT Interval:    QTC Calculation:   R Axis:     Text Interpretation:          Labs:  Results for orders placed or performed during the hospital encounter of 01/26/23 (from the past 24 hour(s))  Urinalysis, Routine w reflex microscopic -Urine, Clean Catch     Status: Abnormal   Collection Time: 01/26/23  8:36 PM  Result Value Ref Range   Color, Urine YELLOW YELLOW   APPearance CLEAR CLEAR   Specific Gravity, Urine 1.025 1.005 - 1.030   pH 5.5 5.0 - 8.0   Glucose, UA NEGATIVE NEGATIVE mg/dL   Hgb urine dipstick NEGATIVE NEGATIVE   Bilirubin Urine NEGATIVE NEGATIVE   Ketones, ur NEGATIVE NEGATIVE mg/dL   Protein, ur 30 (A) NEGATIVE mg/dL   Nitrite NEGATIVE NEGATIVE   Leukocytes,Ua TRACE (A) NEGATIVE  Urinalysis, Microscopic (reflex)     Status: Abnormal   Collection Time: 01/26/23  8:36 PM  Result Value Ref Range   RBC / HPF 0-5 0 - 5 RBC/hpf   WBC, UA 0-5 0 - 5 WBC/hpf   Bacteria, UA FEW (A) NONE SEEN   Squamous Epithelial / HPF 0-5 0 - 5 /HPF   Mucus PRESENT   Basic metabolic panel     Status: Abnormal   Collection Time: 01/26/23  8:57 PM  Result Value Ref Range   Sodium 132 (L) 135 - 145 mmol/L   Potassium 3.9 3.5 - 5.1 mmol/L   Chloride 102 98 - 111 mmol/L   CO2 22 22 - 32 mmol/L   Glucose, Bld 138 (H) 70 - 99 mg/dL   BUN 25 (H) 8 - 23 mg/dL   Creatinine, Ser 1.32 (H) 0.61 - 1.24 mg/dL   Calcium 10.0 8.9 - 10.3 mg/dL   GFR, Estimated 56 (L) >60 mL/min   Anion gap 8 5 - 15  CBC     Status: Abnormal   Collection Time: 01/26/23  8:57 PM  Result Value Ref Range   WBC 6.3 4.0 - 10.5 K/uL   RBC 4.84 4.22 - 5.81 MIL/uL   Hemoglobin 14.9 13.0 - 17.0 g/dL   HCT 44.8 39.0 - 52.0 %   MCV 92.6 80.0 - 100.0 fL   MCH 30.8 26.0 - 34.0 pg   MCHC 33.3 30.0  - 36.0 g/dL   RDW 18.3 (H) 11.5 - 15.5 %   Platelets 291 150 - 400 K/uL   nRBC 0.0 0.0 - 0.2 %  Hepatic function panel     Status: None   Collection Time: 01/26/23  8:57 PM  Result Value Ref Range   Total Protein 7.1 6.5 - 8.1 g/dL   Albumin 3.9 3.5 - 5.0 g/dL   AST 30 15 - 41 U/L   ALT 18 0 - 44 U/L   Alkaline Phosphatase 85 38 - 126 U/L   Total Bilirubin 0.8 0.3 - 1.2 mg/dL   Bilirubin, Direct 0.1 0.0 - 0.2 mg/dL   Indirect Bilirubin 0.7 0.3 - 0.9 mg/dL  Ammonia     Status: None   Collection Time: 01/27/23 12:02 AM  Result Value Ref Range   Ammonia 25 9 - 35 umol/L    Imaging Studies: CT Head Wo Contrast  Result Date: 01/26/2023 CLINICAL  DATA:  Dizziness, difficulty walking confusion. EXAM: CT HEAD WITHOUT CONTRAST TECHNIQUE: Contiguous axial images were obtained from the base of the skull through the vertex without intravenous contrast. RADIATION DOSE REDUCTION: This exam was performed according to the departmental dose-optimization program which includes automated exposure control, adjustment of the mA and/or kV according to patient size and/or use of iterative reconstruction technique. COMPARISON:  CT examination dated September 02, 2021 FINDINGS: Brain: No evidence of acute infarction, hemorrhage, hydrocephalus, extra-axial collection or mass lesion/mass effect. Patchy area of low-attenuation of the periventricular white matter presumed chronic microvascular ischemic changes, unchanged. Vascular: No hyperdense vessel or unexpected calcification. Skull: Normal. Negative for fracture or focal lesion. Sinuses/Orbits: No acute finding. Other: None. IMPRESSION: 1. No acute intracranial abnormality. 2. Chronic microvascular ischemic changes of the white matter. Electronically Signed   By: Keane Police D.O.   On: 01/26/2023 23:37    12:40 AM Dr. Sedonia Small accepts for transfer to [facility] ED for MRI.   Nadim Malia, Jenny Reichmann, MD 01/27/23 0040

## 2023-01-27 NOTE — ED Provider Notes (Signed)
  Physical Exam  BP 122/72 (BP Location: Right Arm)   Pulse 70   Temp 98.7 F (37.1 C) (Oral)   Resp 16   Ht '5\' 6"'$  (1.676 m)   Wt 78 kg   SpO2 97%   BMI 27.76 kg/m   Physical Exam Neurological:     General: No focal deficit present.     Mental Status: He is alert. Mental status is at baseline.     GCS: GCS eye subscore is 4. GCS verbal subscore is 5. GCS motor subscore is 6.     Cranial Nerves: Cranial nerves 2-12 are intact.     Motor: Motor function is intact.     Coordination: Coordination is intact.     Comments: Gait deferred at time of my evaluation; patient resting.     Procedures  Procedures  ED Course / MDM   Clinical Course as of 01/27/23 0250  Fri Jan 26, 2023  2353 Signed out to Dr Florina Ou.  [RP]    Clinical Course User Index [RP] Fransico Meadow, MD   Medical Decision Making Amount and/or Complexity of Data Reviewed Labs: ordered. Radiology: ordered.    77 year old male received in transport from [place] for MRI of the brain To rule out central etiology of his reported dizziness.  Please see associated notes of preceding ED providers for further insight into his initial ED course.  In brief patient with several weeks of changes in his short-term memory and his mood.  More recently began to feel dizzy "like I am spinning but not like the room is spinning".  CT imaging and labs were reassuring.  MR brain negative for acute intracranial abnormality.  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  While the exact etiology is patient's ongoing memory changes and reported dizziness are unclear there is not appear to be any emergent etiology at this time.  He is currently neurologically intact with a nonfocal neurologic exam, ambulatory in the ED tolerating p.o. and GCS 15 with AOx4.  Will recommend outpatient neurology follow-up.  Clinical concern for emergent underlying etiology of his symptoms that would warrant further ED workup  or patient management is exceedingly low.  Jorge and his wife voiced understanding of his medical evaluation and treatment plan. Each of their questions answered to their expressed satisfaction.  Return precautions were given.  Patient is well-appearing, stable, and was discharged in good condition.  This chart was dictated using voice recognition software, Dragon. Despite the best efforts of this provider to proofread and correct errors, errors may still occur which can change documentation meaning.       Aura Dials 01/27/23 [id number]    Quintella Reichert, MD 01/28/23 [phone]

## 2023-01-28 LAB — URINE CULTURE: Culture: NO GROWTH

## 2023-01-29 ENCOUNTER — Ambulatory Visit: Payer: Medicare Other | Admitting: Adult Health

## 2023-01-29 NOTE — Telephone Encounter (Signed)
Called to check status of application. Confirmed receipt of Out of Pocket Expenses and that that was still under review. Representative reached to manager to expedite review. I will continue to follow and update until final determination.   James [name], [facility] [place]  [phone] (phone) [phone] (fax) 01/29/2023 9:36 AM

## 2023-01-30 ENCOUNTER — Ambulatory Visit (INDEPENDENT_AMBULATORY_CARE_PROVIDER_SITE_OTHER): Payer: Medicare Other | Admitting: Neurology

## 2023-01-30 ENCOUNTER — Encounter: Payer: Self-pay | Admitting: Neurology

## 2023-01-30 VITALS — BP 138/72 | HR 88 | Ht 66.0 in | Wt 172.0 lb

## 2023-01-30 DIAGNOSIS — I998 Other disorder of circulatory system: Secondary | ICD-10-CM

## 2023-01-30 DIAGNOSIS — Z7189 Other specified counseling: Secondary | ICD-10-CM

## 2023-01-30 DIAGNOSIS — M5030 Other cervical disc degeneration, unspecified cervical region: Secondary | ICD-10-CM | POA: Insufficient documentation

## 2023-01-30 DIAGNOSIS — R55 Syncope and collapse: Secondary | ICD-10-CM | POA: Diagnosis not present

## 2023-01-30 DIAGNOSIS — R9082 White matter disease, unspecified: Secondary | ICD-10-CM | POA: Diagnosis not present

## 2023-01-30 NOTE — Patient Instructions (Addendum)
77 year old male received in transport from [place] for MRI of the brain To rule out central etiology of his reported dizziness.  Please see associated notes of preceding ED providers for further insight into his initial ED course.  In brief patient with several weeks of changes in his short-term memory and his mood.  More recently began to feel dizzy "like I am spinning but not like the room is spinning".  CT imaging and labs were reassuring.   MR brain negative for acute intracranial abnormality.  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   While the exact etiology is patient's ongoing memory changes and reported dizziness are unclear there is not appear to be any emergent etiology at this time.  He is currently neurologically intact with a nonfocal neurologic exam, ambulatory in the ED tolerating p.o. and GCS 15 with AOx4.    Lets go back to exercise regular, urinary urge incontinence, some memory problems and per wife's observation he has started to shuffle. Main treatment: Fall prevention, next visit with [facility]SE .    No NPH evidence on MRI. Mild atrophy. 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

## 2023-01-30 NOTE — Progress Notes (Signed)
Provider:  Larey Seat, MD  Primary Care Physician:  [name], [place] [address]     Referring Provider: [name], [place],  [place] [address]          Chief Complaint according to patient   Patient presents with:     New Patient (Initial Visit)           HISTORY OF PRESENT ILLNESS:  James [name]. is a 77 y.o. male patient who is here for revisit on  01/30/2023  with his spouse for  recent ED visit and  CPAP compliance. 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   Chief concern according to patient : "My CPAP compliance dropped off because of all the stuff I have dealt with since 01-26-2023."  The patient reports on rather acute onset of dizziness possible syncope presyncope there was no acute cerebrovascular cause identified by MRI or CT of the brain.  He however felt that he could not tolerate his CPAP as well and therefore compliance has dropped off.  His CPAP was used on 87% of the last 30 days but since 31 January he has had a compliance of only about 50% with an average use of 5 hours 46 minutes.  The minimum pressure is 5 the maximum pressure 12 cmH2O with 3 cm expiratory relief.  Current residual AHI is 3.2/h which is still in very good number 95th percentile pressure is 9.4 cm water pressure.  Air leaks are moderate, the patient has more central than obstructive residual apneas.  He was confused about how to put the headgear on.     01-30-2023: Sent from the ED at [facility]  for dizziness.  77 year old male received in transport from [facility] for MRI of the brain to rule out central etiology of his reported dizziness.  Please see associated notes of preceding ED providers for further insight into his initial ED course.  In brief patient with several weeks of changes in his short-term memory and his mood.  More recently began to feel dizzy "like I am spinning but not  like the room is spinning".  CT imaging and labs were reassuring.  MR brain negative for acute intracranial abnormality.  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 While the exact etiology is patient's ongoing memory changes and reported dizziness are unclear there is not appear to be any emergent etiology at this time. He went to the ER and they completed brain MRI and CT scan. He states he has had this before and completed vestibular rehab with PT and that was helpful. That was around year or so ago. He states that his BP never changes with movement.   There are personality changes, and these may be related to an UTI.     HISTORY 10/29/15: James [name]. is a 77 y.o. male ,seen here as a referral from Dr. Philip Aspen  and upon recommendation of his Urologist for a sleep evaluation. He was recently told by Dr. Sharlett Iles as well as by his urologist that his nocturia is not related to any prostate pathology - and that he had no trouble emptying his full bladder. Since he is also obese to Dr. Philip Aspen suggested to have a sleep apnea evaluation as a possible cause for the nocturia.   01-29-15 James [name] underwent a split night polysomnography on 11-26-14 after he  had endorsed the Epworth sleepiness score at an elevated level of 13 points. He has a past medical history including polycythemia vera obesity dyspnea and hypertension and diabetes. In addition he had nocturia more than 3 times at night. He was diagnosed with an AHI of 24.8 and an RDI of 26.5 he slept not on his back but only on his side during the night in the sleep lab he also had very frequent periodic limb movements his PLM arousals were 18.5 per hour. He was titrated to 7 cm CPAP with a reduction of the AHI to 5.4 the now residual apneas were mostly central in nature. His PLM still continued as he was on CPAP. I see him today for his first visit after the sleep study he endorsed today the Epworth sleepiness score at 6 points  which is quite reduced in comparison and his fatigue severity at 19 points. He has been using an O2 sat CPAP machine with a minimum pressure of 5 cm water and a maximum pressure of 12 cm water the 95 st percentile of pressure is 11.2 He is 100% compliance for 30 days and 97% compliance for over 4 hours of use, average time of use of 6 hours 59 minutes EPR level 3 cm water. I would like for this patient to remain on this AutoSet machine-  his residual AHI is only 1.2 , there are no central apneas. He does have a mild air leak. He uses a nasal covering mask. He has some pressure marks around the nose and I suggested to try and nasal pillow. He had not seen one before also the sleep lab staff is supposed to offer several options to the patient. He does like the res med airfit  P 10. I would like to add that the patient still has 3 nocturia is at night in spite of having sufficiently even wonderfully treated.   Interval history from 09-28-15, [name] is here today for follow-up visit with an excellent compliance report. He has used the machine 100% of the last 30 days over 4 hours with an average user time of 7 hours and 54 minutes. He remains on AUTO set 5-12 cm water with 3 cm EPR. He has minor air leaks his 95th percentile pressure is 11.7. The AHI is 0.9. The patient reports sleeping through the night and having only 2 bathroom breaks down from 3 or 4 before being treated for OSA. He tolerates the CPAP well and his Epworth sleepiness score has decreased to 8 points his fatigue severity 27 points and he does not indicate a significant depression on his geriatric depression scale.    09/27/2016: James [name] is a 77 year old male with a history of obstructive sleep apnea on CPAP. He returns today for a compliance download. His download indicates that he uses machine 30 out of 30 days for compliance of 100%. Each night he uses machine greater than 4 hours. On average he uses his machine 7 hours and 30  minutes. His residual AHI is 0.6 on a minimum pressure of 5 cm water and maximum pressure of 12 cm water with EPR 3. His leak in the 95th percentile is 16.9 L/m. Overall he feels that he is doing well. He continues to use the fullface mask. His Epworth sleepiness score is 5. He returns today for an evaluation.   12-15-2018, RV--pleasure of meeting with James [name], James [name]. today and meanwhile 77 year old Caucasian gentleman with severe hearing loss and obstructive sleep apnea treated  on CPAP.  He is here today for his routine compliance visit.[name]  He is also seen by Dr. Casimiro Needle for treatment of anxiety and depression, he receives Wellbutrin and lorazepam as well as Ambien prescriptions through them. He has been on CPAP for over a decade. About 8 years ago his son died and he was without sleep for many days, treated there ever since.  The patient is as in the years before highly compliant, 30 out of 30 days use was 29 of those days over 4 consecutive hours average user time is 6 hours and 49 minutes, minimum pressure 5 maximum pressure on his AutoSet 12 cmH2O with 3 cm EPR and an AHI of 0.3.  No major air leaks the 95th percentile pressure is 10.9.  The machine is comfortable to use with the current set up, and he continues to use a nasal mask     Interval history from 30 October 2016,  I have the pleasure of seeing James [name] today who has been a compliant CPAP user. Yesterday's compliance download showed 30 days was 97% compliance in hours an average of 7 hours and 38 minutes was reached, he is using an AutoSet between 5 and 12 cmH2O was 3 cm EPR his residual AHI is reduced to 0.5/h, is 95th percentile pressure is 11.7.  There are no central apneas emerging, his fatigue severity score is low at 17 point and his Epworth Sleepiness Scale was endorsed at six-points. He is very happy with CPAP and it's positive effects on alertness.         Review of Systems: Out of a complete 14 system review, the  patient complains of only the following symptoms, and all other reviewed systems are negative.:  Fatigue, sleepiness , snoring, fragmented sleep, Insomnia, RLS,   When I get up from sleep I have to be slow or I get lightheaded. Described more pre syncope rather than VERTIGO, has not seen PCP or orthostatics.  Had in the past nystagmus and had benefit from Epley maneuver. Had BPV in the past.    How likely are you to doze in the following situations: 0 = not likely, 1 = slight chance, 2 = moderate chance, 3 = high chance   Sitting and Reading? Watching Television? Sitting inactive in a public place (theater or meeting)? As a passenger in a car for an hour without a break? Lying down in the afternoon when circumstances permit? Sitting and talking to someone? Sitting quietly after lunch without alcohol? In a car, while stopped for a few minutes in traffic?   Total = 9/ 24 points   FSS endorsed at 43/ 63 points.   Social History   Socioeconomic History   Marital status: Married    Spouse name: Not on file   Number of children: 2   Years of education: Not on file   Highest education level: Not on file  Occupational History   Occupation: retired  Tobacco Use   Smoking status: Former    Types: Pipe    Quit date: 1975    Years since quitting: 49.1   Smokeless tobacco: Never  Vaping Use   Vaping Use: Never used  Substance and Sexual Activity   Alcohol use: Yes    Alcohol/week: 0.0 standard drinks of alcohol   Drug use: No   Sexual activity: Not on file  Other Topics Concern   Not on file  Social History Narrative   Right handed.  Caffeine 3 cups daily,  Married 2 kids (one deceased).  College grad.  [employer].    Social Determinants of Radio broadcast assistant Strain: Not on file  Food Insecurity: Not on file  Transportation Needs: Not on file  Physical Activity: Not on file  Stress: Not on file  Social Connections: Not on file     Family History  Problem Relation Age of Onset   Diabetes Mother        history   Stroke Mother        history   Lung disease Father        history   Bipolar disorder Son        committed suicide   Colon cancer Other    Heart failure Other    Heart attack Other    Colon polyps Neg Hx    Rectal cancer Neg Hx    Stomach cancer Neg Hx    Esophageal cancer Neg Hx    Sleep apnea Neg Hx     Past Medical History:  Diagnosis Date   Anxiety    Arthritis    Clotting disorder ([facility])    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)    History   Portal vein thrombosis    Rhinitis    Situational depression    Sleep apnea    uses CPAP every night   Tubular adenoma 12/10/2015   6 cecum polyps    Past Surgical History:  Procedure Laterality Date   CARDIAC CATHETERIZATION     greater 10 yrs ago, normal   CARPAL TUNNEL RELEASE     bilateral   COLONOSCOPY  06/2005   [facility] Dr Lajoyce [facility]   ESOPHAGOGASTRODUODENOSCOPY (EGD) WITH PROPOFOL N/A 09/28/2016   Procedure: ESOPHAGOGASTRODUODENOSCOPY (EGD) WITH PROPOFOL;  Surgeon: Gatha Mayer, MD;  Location: [facility];  Service: Endoscopy;  Laterality: N/A;   IR GENERIC HISTORICAL  12/12/2016   IR US GUIDE VASC ACCESS RIGHT 12/12/2016 [name], MD [facility]   IR GENERIC HISTORICAL  12/12/2016   IR VENOGRAM HEPATIC W HEMODYNAMIC EVALUATION 12/12/2016 [name], MD [facility]   IR GENERIC HISTORICAL  12/12/2016   IR TRANSCATHETER BX 12/12/2016 [name], MD [facility]   REVERSE  SHOULDER ARTHROPLASTY Right 09/01/2021   Procedure: REVERSE SHOULDER ARTHROPLASTY;  Surgeon: Justice Britain, MD;  Location: [facility];  Service: Orthopedics;  Laterality: Right;  128mn,   ROTATOR CUFF REPAIR     right   TENDON REPAIR     left arm   TONSILLECTOMY     TOTAL KNEE ARTHROPLASTY Right 2010   WISDOM TOOTH EXTRACTION       Current Outpatient Medications on File Prior to Visit  Medication Sig Dispense Refill   amLODipine (NORVASC) 5 MG tablet Take 5 mg by mouth daily.     buPROPion (WELLBUTRIN XL) 300 MG 24 hr tablet Take 300 mg by mouth every morning.     cephALEXin (KEFLEX) 500  MG capsule Take 2,000 mg by mouth See admin instructions. Prior to dental procedure     dasatinib (SPRYCEL) 70 MG tablet Take 1 tablet (70 mg total) by mouth daily. 30 tablet 12   desmopressin (DDAVP) 0.2 MG tablet Take 400 mcg by mouth at bedtime.     fluticasone (FLONASE) 50 MCG/ACT nasal spray Place 1 spray into both nostrils as needed for allergies.     glucose blood test strip OneTouch Ultra Blue Test Strip  USE TO TEST BLOOD SUGAR ONCE DAILY     lidocaine (LIDODERM) 5 % Place 1 patch onto the skin daily. Remove & Discard patch within 12 hours or as directed by MD (Patient taking differently: Place 1 patch onto the skin as needed. Remove & Discard patch within 12 hours or as directed by MD) 30 patch 0   Lidocaine 4 % PTCH Apply 1 patch topically as needed (pain).     LORazepam (ATIVAN) 0.5 MG tablet Take 0.5 mg by mouth at bedtime.  3   losartan-hydrochlorothiazide (HYZAAR) 100-12.5 MG tablet Take 1 tablet by mouth daily.     meclizine (ANTIVERT) 25 MG tablet Take 25 mg by mouth daily as needed for dizziness.     metFORMIN (GLUCOPHAGE-XR) 500 MG 24 hr tablet Take 500 mg by mouth 2 (two) times daily.     moxifloxacin (VIGAMOX) 0.5 % ophthalmic solution Place 1 drop into the right eye See admin instructions. Place 1 drop in right eye QID the day before, day of, and day after treatment.  5   polyethylene  glycol (MIRALAX / GLYCOLAX) 17 g packet Take 17 g by mouth as needed for moderate constipation.     PRESCRIPTION MEDICATION Inject 1 Dose as directed every 8 (eight) weeks. Gets injections in right eye at dr's office every 8 weeks     rosuvastatin (CRESTOR) 20 MG tablet Take 20 mg by mouth at bedtime.     sildenafil (VIAGRA) 50 MG tablet Take 50 mg by mouth daily as needed.     testosterone cypionate (DEPOTESTOSTERONE CYPIONATE) 200 MG/ML injection Inject 200 mg into the muscle every 21 ( twenty-one) days.     TRULICITY 1.5 0000000 SOPN Inject 1.5 mg into the muscle every Monday.     valsartan-hydrochlorothiazide (DIOVAN-HCT) 320-25 MG tablet Take 1 tablet by mouth daily.     vitamin B-12 (CYANOCOBALAMIN) 1000 MCG tablet Take 1,000 mcg by mouth daily.     XARELTO 10 MG TABS tablet TAKE 1 TABLET BY MOUTH EVERY DAY 30 tablet 11   No current facility-administered medications on file prior to visit.    Allergies  Allergen Reactions   Ibuprofen Anaphylaxis and Other (See Comments)    Upper GI Bled   Nsaids Other (See Comments)    GI Bleed     DIAGNOSTIC DATA (LABS, IMAGING, TESTING) - I reviewed patient records, labs, notes, testing and imaging myself where available.  Lab Results  Component Value Date   WBC 6.3 01/26/2023   HGB 14.9 01/26/2023   HCT 44.8 01/26/2023   MCV 92.6 01/26/2023   PLT 291 01/26/2023      Component Value Date/Time   NA 132 (L) 01/26/2023 2057   NA 137 10/29/2017 1307   NA 134 (L) 05/10/2017 0755   K 3.9 01/26/2023 2057   K 3.6 10/29/2017 1307   K 4.2 05/10/2017 0755   CL 102 01/26/2023 2057   CL 101 10/29/2017 1307   CO2 22 01/26/2023 2057   CO2 25 10/29/2017 1307  CO2 20 (L) 05/10/2017 0755   GLUCOSE 138 (H) 01/26/2023 2057   GLUCOSE 200 (H) 10/29/2017 1307   BUN 25 (H) 01/26/2023 2057   BUN 14 10/29/2017 1307   BUN 21.0 05/10/2017 0755   CREATININE 1.32 (H) 01/26/2023 2057   CREATININE 1.53 (H) 12/27/2022 1326   CREATININE 1.4 (H) 10/29/2017  1307   CREATININE 1.3 05/10/2017 0755   CALCIUM 10.0 01/26/2023 2057   CALCIUM 10.3 10/29/2017 1307   CALCIUM 10.3 05/10/2017 0755   PROT 7.1 01/26/2023 2057   PROT 6.5 10/29/2017 1307   PROT 6.9 05/10/2017 0755   ALBUMIN 3.9 01/26/2023 2057   ALBUMIN 3.6 10/29/2017 1307   ALBUMIN 3.9 05/10/2017 0755   AST 30 01/26/2023 2057   AST 20 12/27/2022 1326   AST 25 05/10/2017 0755   ALT 18 01/26/2023 2057   ALT 15 12/27/2022 1326   ALT 27 10/29/2017 1307   ALT 26 05/10/2017 0755   ALKPHOS 85 01/26/2023 2057   ALKPHOS 77 10/29/2017 1307   ALKPHOS 97 05/10/2017 0755   BILITOT 0.8 01/26/2023 2057   BILITOT 0.6 12/27/2022 1326   BILITOT 0.70 05/10/2017 0755   GFRNONAA 56 (L) 01/26/2023 2057   GFRNONAA 47 (L) 12/27/2022 1326   GFRAA 53 (L) 09/02/2020 0900   No results found for: "CHOL", "HDL", "LDLCALC", "LDLDIRECT", "TRIG", "CHOLHDL" Lab Results  Component Value Date   HGBA1C 6.0 (H) 08/24/2021   Lab Results  Component Value Date   VITAMINB12 949 (H) 09/02/2021   No results found for: "TSH"  PHYSICAL EXAM:  Today's Vitals   01/30/23 0900  BP: 138/72  Pulse: 88  Weight: 172 lb (78 kg)  Height: 5' 6"$  (1.676 m)   Body mass index is 27.76 kg/m.   Wt Readings from Last 3 Encounters:  01/30/23 172 lb (78 kg)  01/26/23 172 lb (78 kg)  12/27/22 177 lb (80.3 kg)     Ht Readings from Last 3 Encounters:  01/30/23 5' 6"$  (1.676 m)  01/26/23 5' 6"$  (1.676 m)  09/13/22 5' 6"$  (1.676 m)      General: The patient is awake, alert and appears not in acute distress. The patient is well groomed. Head: Normocephalic, atraumatic. Neck is supple.  Retrognathia is  seen.  Dental status:  Cardiovascular:  Regular rate and cardiac rhythm by pulse,  without distended neck veins. Respiratory: Lungs are clear to auscultation.  Skin:  Without evidence of ankle edema, or rash. Trunk: The patient's posture is erect.   NEUROLOGIC EXAM: The patient is awake and alert, oriented to place and  time.   Memory subjective described as intact.  Attention span & concentration ability appears normal.  Speech is fluent,  without dysarthria, dysphonia but some struggling for words. .  Mood and affect are appropriate.   Cranial nerves: no loss of smell or taste reported  Pupils are equal and briskly reactive to light. Funduscopic exam - status post cataract surgery. .  Extraocular movements in vertical and horizontal planes were intact and without nystagmus. No Diplopia. Visual fields by finger perimetry are intact. Hearing was intact to soft voice and finger rubbing.    Facial sensation intact to fine touch.  Facial motor strength is symmetric and tongue and uvula move midline.  Neck ROM : rotation, tilt and flexion extension were normal for age and shoulder shrug was symmetrical.    Motor exam:  Symmetric bulk, tone and ROM.   Normal tone without cog wheeling, symmetric grip strength .  Sensory:  Fine touch, pinprick and vibration were tested  and  normal.  Proprioception tested in the upper extremities was normal.   Coordination: Rapid alternating movements in the fingers/hands were of normal speed.  The Finger-to-nose maneuver was intact without evidence of ataxia, dysmetria or tremor.   Gait and station: Patient could rise unassisted from a seated position, no bracing ,he  walked without assistive device.  Stance is of normal width/ base and the patient turned with 4-5 steps, this is impaired .  Toe and heel walk were deferred.  Wife reports shuffling at home and here he did not show much arm swings.  Deep tendon reflexes: in the  upper and lower extremities are symmetric and intact.  Babinski response was deferred .    ASSESSMENT AND PLAN 77 y.o. year old male  here with: follow up after presyncope ED visit.     1) pre- syncope spell, not BPV< no Nystagmus, and perhaps some confusion? He  wasn't able to put on his CPAP gear and uses CPAP for 9 years.   2) he has not  endorsed vertigo symptoms and Epley maneuver was not helpful.   3) I did not find evidence of shuffling gait and he stood up without lightheadedness here .   Lets go back to exercise regular, urinary urge incontinence, some memory problems and per wife's observation he has started to shuffle. Main treatment: Fall prevention, next visit with [facility] .   No NPH evidence on MRI. Mild atrophy. There is cervical spine disease, status post fusion.   I plan to follow up either personally or through our NP within 9 months.   I would like to thank [name], MD and [name], [address] [place],  [place] [address] for allowing me to meet with and to take care of this pleasant patient.   CC: I will share my notes with PCP.  After spending a total time of  40  minutes face to face and additional time for physical and neurologic examination, review of laboratory studies,  personal review of imaging studies, reports and results of other testing and review of referral information / records as far as provided in visit,   Electronically signed by: Larey Seat, MD 01/30/2023 9:35 AM  [facility] certified by The [employer] of Sleep Medicine and Diplomate of the [employer] of Sleep Medicine. Board certified In Neurology through the [facility], Fellow of the [employer] of Neurology. Medical Director of [employer].

## 2023-01-31 ENCOUNTER — Telehealth: Payer: Self-pay

## 2023-01-31 NOTE — Telephone Encounter (Signed)
Received call from pt with concerns related to his Sprycel. [name], pharm tech is working with patient and [facility] on pt assistance. Per Suezanne Jacquet, this is currently under review. Pt will be out of drug on Monday 2/18 & questions if Dr [name] is comfortable with him missing a few doses should his assistance delay delivery of drug. Per Dr [name], ok to wait for patient assistance to be complete. Simona Huh notified via phone and verbalizes understanding using teachback. dph

## 2023-02-01 NOTE — Telephone Encounter (Signed)
Left VM for patient to call me back to give him [facility]' phone number to check the status of his delivery [phone]).

## 2023-02-01 NOTE — Telephone Encounter (Addendum)
[name] Encounter   Received notification re-enrollment for assistance for Sprycel through [employer] has been approved. Patient may continue to receive their medication at $0 from this program.    [facility]' phone number [phone].   Effective dates: 01/31/23 through 02/01/24  I have spoken to the patient.  [name], [facility] Patient [place]  [phone] (phone) [phone] (fax) 02/01/2023 8:18 AM

## 2023-02-02 NOTE — Telephone Encounter (Signed)
Patient returned my call and knows to call [employer] at [phone] to check status of prescription.   [name], [facility] Patient [place]  [phone] (phone) [phone] (fax) 02/02/2023 8:22 AM

## 2023-03-28 ENCOUNTER — Inpatient Hospital Stay: Payer: Medicare Other

## 2023-03-28 ENCOUNTER — Encounter: Payer: Self-pay | Admitting: Hematology & Oncology

## 2023-03-28 ENCOUNTER — Inpatient Hospital Stay: Payer: Medicare Other | Attending: Hematology & Oncology

## 2023-03-28 ENCOUNTER — Other Ambulatory Visit: Payer: Self-pay

## 2023-03-28 ENCOUNTER — Inpatient Hospital Stay (HOSPITAL_BASED_OUTPATIENT_CLINIC_OR_DEPARTMENT_OTHER): Payer: Medicare Other | Admitting: Hematology & Oncology

## 2023-03-28 VITALS — BP 162/92 | HR 70 | Temp 98.0°F | Resp 18 | Ht 66.0 in | Wt 184.1 lb

## 2023-03-28 VITALS — BP 126/68 | HR 64 | Resp 18

## 2023-03-28 DIAGNOSIS — D45 Polycythemia vera: Secondary | ICD-10-CM

## 2023-03-28 DIAGNOSIS — C9210 Chronic myeloid leukemia, BCR/ABL-positive, not having achieved remission: Secondary | ICD-10-CM

## 2023-03-28 DIAGNOSIS — D751 Secondary polycythemia: Secondary | ICD-10-CM | POA: Insufficient documentation

## 2023-03-28 DIAGNOSIS — D500 Iron deficiency anemia secondary to blood loss (chronic): Secondary | ICD-10-CM

## 2023-03-28 LAB — CMP (CANCER CENTER ONLY)
ALT: 19 U/L (ref 0–44)
AST: 20 U/L (ref 15–41)
Albumin: 4.3 g/dL (ref 3.5–5.0)
Alkaline Phosphatase: 92 U/L (ref 38–126)
Anion gap: 6 (ref 5–15)
BUN: 25 mg/dL — ABNORMAL HIGH (ref 8–23)
CO2: 26 mmol/L (ref 22–32)
Calcium: 10.3 mg/dL (ref 8.9–10.3)
Chloride: 106 mmol/L (ref 98–111)
Creatinine: 1.36 mg/dL — ABNORMAL HIGH (ref 0.61–1.24)
GFR, Estimated: 54 mL/min — ABNORMAL LOW (ref >60)
Glucose, Bld: 131 mg/dL — ABNORMAL HIGH (ref 70–99)
Potassium: 4.8 mmol/L (ref 3.5–5.1)
Sodium: 138 mmol/L (ref 135–145)
Total Bilirubin: 0.4 mg/dL (ref 0.3–1.2)
Total Protein: 7.1 g/dL (ref 6.5–8.1)

## 2023-03-28 LAB — CBC WITH DIFFERENTIAL (CANCER CENTER ONLY)
Abs Immature Granulocytes: 0.01 10*3/uL (ref 0.00–0.07)
Basophils Absolute: 0.1 10*3/uL (ref 0.0–0.1)
Basophils Relative: 1 %
Eosinophils Absolute: 0.4 10*3/uL (ref 0.0–0.5)
Eosinophils Relative: 6 %
HCT: 50.7 % (ref 39.0–52.0)
Hemoglobin: 16.1 g/dL (ref 13.0–17.0)
Immature Granulocytes: 0 %
Lymphocytes Relative: 20 %
Lymphs Abs: 1.5 10*3/uL (ref 0.7–4.0)
MCH: 29.1 pg (ref 26.0–34.0)
MCHC: 31.8 g/dL (ref 30.0–36.0)
MCV: 91.7 fL (ref 80.0–100.0)
Monocytes Absolute: 1.1 10*3/uL — ABNORMAL HIGH (ref 0.1–1.0)
Monocytes Relative: 15 %
Neutro Abs: 4.1 10*3/uL (ref 1.7–7.7)
Neutrophils Relative %: 58 %
Platelet Count: 264 10*3/uL (ref 150–400)
RBC: 5.53 MIL/uL (ref 4.22–5.81)
RDW: 15.3 % (ref 11.5–15.5)
WBC Count: 7.2 10*3/uL (ref 4.0–10.5)
nRBC: 0 % (ref 0.0–0.2)

## 2023-03-28 LAB — IRON AND IRON BINDING CAPACITY (CC-WL,HP ONLY)
Iron: 134 ug/dL (ref 45–182)
Saturation Ratios: 32 % (ref 17.9–39.5)
TIBC: 424 ug/dL (ref 250–450)
UIBC: 290 ug/dL (ref 117–376)

## 2023-03-28 LAB — LACTATE DEHYDROGENASE: LDH: 153 U/L (ref 98–192)

## 2023-03-28 LAB — FERRITIN: Ferritin: 14 ng/mL — ABNORMAL LOW (ref 24–336)

## 2023-03-28 NOTE — Progress Notes (Signed)
James [name] presents today for phlebotomy per MD orders. Phlebotomy procedure started at 12:26 PM and ended at 1235. 550 grams removed via 16 gauge needle to left AC by Gladis Riffle RN.  Patient observed for 30 minutes after procedure without any incident. Patient tolerated procedure well and declined replacement fluids after procedure.  Patient understands to call if he has any questions or concerns post discharge.

## 2023-03-28 NOTE — Progress Notes (Signed)
Hematology and Oncology Follow Up Visit  James [name] [id number] 26-Dec-1945 77 y.o. 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 IV iron as indicated - last received in March 2022        Interim History:  James [name] is here today for follow-up.  He is doing quite well.  He is off some of his medications now.  He has lost some weight which certainly will help with his diabetes.  He is on Sprycel for his CML.  When we last saw him in January, there is no detectable BCR/ABL in his blood.  He has had no issues with headaches.   When we last saw him in January, his ferritin was 17 with an iron saturation of 18%.  He continues on Xarelto.  There is been no problems with bleeding.  He has had no change in bowel or bladder habits.  Overall, I would say his performance status is probably ECOG 1.     Medications:  Allergies as of 03/28/2023       Reactions   Ibuprofen Anaphylaxis, Other (See Comments)   Upper GI Bled   Nsaids Other (See Comments)   GI Bleed        Medication List        Accurate as of March 28, 2023 12:05 PM. If you have any questions, ask your nurse or doctor.          STOP taking these medications    lidocaine 4 % Stopped by: Josph Macho, MD   lidocaine 5 % Commonly known as: Lidoderm Stopped by: Josph Macho, MD   polyethylene glycol 17 g packet Commonly known as: MIRALAX / GLYCOLAX Stopped by: Josph Macho, MD   PRESCRIPTION MEDICATION Stopped by: Josph Macho, MD   Trulicity 1.5 MG/0.5ML Sopn Generic drug: Dulaglutide Stopped by: Josph Macho, MD       TAKE these medications    amLODipine 5 MG tablet Commonly known as: NORVASC Take 5 mg by mouth daily.   buPROPion 300 MG 24  hr tablet Commonly known as: WELLBUTRIN XL Take 300 mg by mouth every morning.   cephALEXin 500 MG capsule Commonly known as: KEFLEX Take 2,000 mg by mouth See admin instructions. Prior to dental procedure   cyanocobalamin 1000 MCG tablet Commonly known as: VITAMIN B12 Take 1,000 mcg by mouth daily.   dasatinib 70 MG tablet Commonly known as: Sprycel Take 1 tablet (70 mg total) by mouth daily.   desmopressin 0.2 MG tablet Commonly known as: DDAVP Take 400 mcg by mouth at bedtime.   fluticasone 50 MCG/ACT nasal spray Commonly known as: FLONASE Place 1 spray into both nostrils as needed for allergies.   glucose blood test strip OneTouch Ultra Blue Test Strip  USE TO TEST BLOOD SUGAR ONCE DAILY   LORazepam 0.5 MG tablet Commonly known as: ATIVAN Take 0.5 mg by mouth at bedtime.   losartan-hydrochlorothiazide 100-12.5 MG tablet Commonly known as: HYZAAR Take 1 tablet by mouth daily.   meclizine 25 MG tablet Commonly known as: ANTIVERT Take 25 mg by mouth daily as needed for dizziness.   metFORMIN 500 MG 24 hr tablet Commonly known as: GLUCOPHAGE-XR Take 500 mg by mouth 2 (two) times daily.  moxifloxacin 0.5 % ophthalmic solution Commonly known as: VIGAMOX Place 1 drop into the right eye See admin instructions. Place 1 drop in right eye QID the day before, day of, and day after treatment.   rosuvastatin 20 MG tablet Commonly known as: CRESTOR Take 20 mg by mouth at bedtime.   sildenafil 50 MG tablet Commonly known as: VIAGRA Take 50 mg by mouth daily as needed.   testosterone cypionate 200 MG/ML injection Commonly known as: DEPOTESTOSTERONE CYPIONATE Inject 200 mg into the muscle every 21 ( twenty-one) days.   valsartan-hydrochlorothiazide 320-25 MG tablet Commonly known as: DIOVAN-HCT Take 1 tablet by mouth daily.   Xarelto 10 MG Tabs tablet Generic drug: rivaroxaban TAKE 1 TABLET BY MOUTH EVERY DAY        Allergies:  Allergies  Allergen  Reactions   Ibuprofen Anaphylaxis and Other (See Comments)    Upper GI Bled   Nsaids Other (See Comments)    GI Bleed    Past Medical History, Surgical history, Social history, and Family History were reviewed and updated.  Review of Systems: Review of Systems  Constitutional: Negative.   HENT: Negative.    Eyes: Negative.   Respiratory: Negative.    Cardiovascular: Negative.   Gastrointestinal: Negative.   Genitourinary: Negative.   Musculoskeletal: Negative.   Skin: Negative.   Neurological: Negative.   Endo/Heme/Allergies: Negative.   Psychiatric/Behavioral: Negative.      Physical Exam:  height is 5\' 6"  (1.676 m) and weight is 184 lb 1.9 oz (83.5 kg). His oral temperature is 98 F (36.7 C). His blood pressure is 162/92 (abnormal) and his pulse is 70. His respiration is 18 and oxygen saturation is 100%.   Wt Readings from Last 3 Encounters:  03/28/23 184 lb 1.9 oz (83.5 kg)  01/30/23 172 lb (78 kg)  01/26/23 172 lb (78 kg)    Physical Exam Vitals reviewed.  HENT:     Head: Normocephalic and atraumatic.  Eyes:     Pupils: Pupils are equal, round, and reactive to light.  Cardiovascular:     Rate and Rhythm: Normal rate and regular rhythm.     Heart sounds: Normal heart sounds.  Pulmonary:     Effort: Pulmonary effort is normal.     Breath sounds: Normal breath sounds.  Abdominal:     General: Bowel sounds are normal.     Palpations: Abdomen is soft.  Musculoskeletal:        General: No tenderness or deformity. Normal range of motion.     Cervical back: Normal range of motion.  Lymphadenopathy:     Cervical: No cervical adenopathy.  Skin:    General: Skin is warm and dry.     Findings: No erythema or rash.  Neurological:     Mental Status: He is alert and oriented to person, place, and time.  Psychiatric:        Behavior: Behavior normal.        Thought Content: Thought content normal.        Judgment: Judgment normal.      Lab Results  Component  Value Date   WBC 7.2 03/28/2023   HGB 16.1 03/28/2023   HCT 50.7 03/28/2023   MCV 91.7 03/28/2023   PLT 264 03/28/2023   Lab Results  Component Value Date   FERRITIN 17 (L) 12/27/2022   IRON 62 12/27/2022   TIBC 346 12/27/2022   UIBC 284 12/27/2022   IRONPCTSAT 18 12/27/2022   Lab Results  Component Value Date  RETICCTPCT 2.7 12/27/2022   RBC 5.53 03/28/2023   RETICCTABS 94.0 08/30/2011   No results found for: "KPAFRELGTCHN", "LAMBDASER", "[facility]" Lab Results  Component Value Date   IGGSERUM 828 10/11/2016   IGA 177 06/22/2016   No results found for: "TOTALPROTELP", "ALBUMINELP", "A1GS", "A2GS", "BETS", "BETA2SER", "GAMS", "MSPIKE", "SPEI"   Chemistry      Component Value Date/Time   NA 138 03/28/2023 1120   NA 137 10/29/2017 1307   NA 134 (L) 05/10/2017 0755   K 4.8 03/28/2023 1120   K 3.6 10/29/2017 1307   K 4.2 05/10/2017 0755   CL 106 03/28/2023 1120   CL 101 10/29/2017 1307   CO2 26 03/28/2023 1120   CO2 25 10/29/2017 1307   CO2 20 (L) 05/10/2017 0755   BUN 25 (H) 03/28/2023 1120   BUN 14 10/29/2017 1307   BUN 21.0 05/10/2017 0755   CREATININE 1.36 (H) 03/28/2023 1120   CREATININE 1.4 (H) 10/29/2017 1307   CREATININE 1.3 05/10/2017 0755      Component Value Date/Time   CALCIUM 10.3 03/28/2023 1120   CALCIUM 10.3 10/29/2017 1307   CALCIUM 10.3 05/10/2017 0755   ALKPHOS 92 03/28/2023 1120   ALKPHOS 77 10/29/2017 1307   ALKPHOS 97 05/10/2017 0755   AST 20 03/28/2023 1120   AST 25 05/10/2017 0755   ALT 19 03/28/2023 1120   ALT 27 10/29/2017 1307   ALT 26 05/10/2017 0755   BILITOT 0.4 03/28/2023 1120   BILITOT 0.70 05/10/2017 0755     Impression and Plan: James [name] is 77 year old gentleman with a long history of polycythemia. He is JAK2 negative.  [facility]  He is on Sprycel.  He is responding quite nicely.  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  We will go ahead and phlebotomize him.  His  hemoglobin/hematocrit is on the high side.  I think this will probably help with some of his laboratory parameters.  He will continue on the Sprycel.  He is doing very nicely with this.  We see him back every 3 months.  This is a good interval for follow-up.    Josph MachoPeter R Lil Lepage, MD 4/10/202412:05 PM

## 2023-03-28 NOTE — Patient Instructions (Signed)

## 2023-04-10 ENCOUNTER — Telehealth: Payer: Self-pay | Admitting: *Deleted

## 2023-04-10 LAB — BCR/ABL

## 2023-04-10 NOTE — Telephone Encounter (Signed)
As noted below by Dr. 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 Instructed him to call the office if he had any questions or concerns.

## 2023-04-10 NOTE — Telephone Encounter (Signed)
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  Thanks.  Cindee Lame

## 2023-06-27 ENCOUNTER — Encounter: Payer: Self-pay | Admitting: Hematology & Oncology

## 2023-06-27 ENCOUNTER — Inpatient Hospital Stay: Payer: Medicare Other | Attending: Hematology & Oncology

## 2023-06-27 ENCOUNTER — Inpatient Hospital Stay: Payer: Medicare Other

## 2023-06-27 ENCOUNTER — Inpatient Hospital Stay (HOSPITAL_BASED_OUTPATIENT_CLINIC_OR_DEPARTMENT_OTHER): Payer: Medicare Other | Admitting: Hematology & Oncology

## 2023-06-27 VITALS — BP 131/68 | HR 74 | Temp 98.2°F | Resp 20 | Ht 66.0 in | Wt 185.0 lb

## 2023-06-27 VITALS — BP 116/58 | HR 64

## 2023-06-27 DIAGNOSIS — C9210 Chronic myeloid leukemia, BCR/ABL-positive, not having achieved remission: Secondary | ICD-10-CM

## 2023-06-27 DIAGNOSIS — D500 Iron deficiency anemia secondary to blood loss (chronic): Secondary | ICD-10-CM

## 2023-06-27 DIAGNOSIS — D751 Secondary polycythemia: Secondary | ICD-10-CM | POA: Insufficient documentation

## 2023-06-27 DIAGNOSIS — D45 Polycythemia vera: Secondary | ICD-10-CM

## 2023-06-27 LAB — CMP (CANCER CENTER ONLY)
ALT: 13 U/L (ref 0–44)
AST: 19 U/L (ref 15–41)
Albumin: 4.4 g/dL (ref 3.5–5.0)
Alkaline Phosphatase: 84 U/L (ref 38–126)
Anion gap: 6 (ref 5–15)
BUN: 16 mg/dL (ref 8–23)
CO2: 28 mmol/L (ref 22–32)
Calcium: 10.8 mg/dL — ABNORMAL HIGH (ref 8.9–10.3)
Chloride: 103 mmol/L (ref 98–111)
Creatinine: 1.55 mg/dL — ABNORMAL HIGH (ref 0.61–1.24)
GFR, Estimated: 46 mL/min — ABNORMAL LOW (ref >60)
Glucose, Bld: 162 mg/dL — ABNORMAL HIGH (ref 70–99)
Potassium: 4.7 mmol/L (ref 3.5–5.1)
Sodium: 137 mmol/L (ref 135–145)
Total Bilirubin: 0.7 mg/dL (ref 0.3–1.2)
Total Protein: 7.1 g/dL (ref 6.5–8.1)

## 2023-06-27 LAB — CBC WITH DIFFERENTIAL (CANCER CENTER ONLY)
Abs Immature Granulocytes: 0.02 10*3/uL (ref 0.00–0.07)
Basophils Absolute: 0 10*3/uL (ref 0.0–0.1)
Basophils Relative: 1 %
Eosinophils Absolute: 0.6 10*3/uL — ABNORMAL HIGH (ref 0.0–0.5)
Eosinophils Relative: 10 %
HCT: 49.5 % (ref 39.0–52.0)
Hemoglobin: 15 g/dL (ref 13.0–17.0)
Immature Granulocytes: 0 %
Lymphocytes Relative: 10 %
Lymphs Abs: 0.6 10*3/uL — ABNORMAL LOW (ref 0.7–4.0)
MCH: 26.5 pg (ref 26.0–34.0)
MCHC: 30.3 g/dL (ref 30.0–36.0)
MCV: 87.5 fL (ref 80.0–100.0)
Monocytes Absolute: 0.9 10*3/uL (ref 0.1–1.0)
Monocytes Relative: 14 %
Neutro Abs: 4 10*3/uL (ref 1.7–7.7)
Neutrophils Relative %: 65 %
Platelet Count: 242 10*3/uL (ref 150–400)
RBC: 5.66 MIL/uL (ref 4.22–5.81)
RDW: 19 % — ABNORMAL HIGH (ref 11.5–15.5)
WBC Count: 6.2 10*3/uL (ref 4.0–10.5)
nRBC: 0 % (ref 0.0–0.2)

## 2023-06-27 LAB — IRON AND IRON BINDING CAPACITY (CC-WL,HP ONLY)
Iron: 41 ug/dL — ABNORMAL LOW (ref 45–182)
Saturation Ratios: 10 % — ABNORMAL LOW (ref 17.9–39.5)
TIBC: 423 ug/dL (ref 250–450)
UIBC: 382 ug/dL — ABNORMAL HIGH (ref 117–376)

## 2023-06-27 LAB — FERRITIN: Ferritin: 15 ng/mL — ABNORMAL LOW (ref 24–336)

## 2023-06-27 NOTE — Progress Notes (Signed)
Hematology and Oncology Follow Up Visit  James [name] [id number] [birth year] 77 y.o. 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 IV iron as indicated - last received in March 2022        Interim History:  James [name] is here today for follow-up.  I actually saw him at the barbershop few weeks ago.  He not go to the same hairstylist.  He is doing quite well.  He is in acute molecular remission by his last BCR/ABL.  He has had no problems with Xarelto.  There is been no bleeding.  He has had no change in bowel or bladder habits.  He has had no cough or shortness of breath.  He has had no rashes.  There is been no pain in his hands or feet.  He has had no cough or shortness of breath.  He has had no headache.  His last iron studies that were done back in April showed a ferritin of 14 with an iron saturation of 32%.  Overall, I would say that his performance status is probably ECOG 1.     Medications:  Allergies as of 06/27/2023       Reactions   Ibuprofen Anaphylaxis, Other (See Comments)   Upper GI Bled   Nsaids Other (See Comments)   GI Bleed        Medication List        Accurate as of June 27, 2023 11:17 AM. If you have any questions, ask your nurse or doctor.          STOP taking these medications    cephALEXin 500 MG capsule Commonly known as: KEFLEX Stopped by: Josph Macho, MD       TAKE these medications    amLODipine 5 MG tablet Commonly known as: NORVASC Take 5 mg by mouth daily.   amoxicillin 500 MG capsule Commonly known as: AMOXIL 2,000 mg. Prior to dental work.   buPROPion 300 MG 24 hr tablet Commonly known as: WELLBUTRIN XL Take 300 mg by mouth every morning.   cyanocobalamin 1000 MCG  tablet Commonly known as: VITAMIN B12 Take 1,000 mcg by mouth daily.   dasatinib 70 MG tablet Commonly known as: Sprycel Take 1 tablet (70 mg total) by mouth daily.   desmopressin 0.2 MG tablet Commonly known as: DDAVP Take 400 mcg by mouth at bedtime.   fluticasone 50 MCG/ACT nasal spray Commonly known as: FLONASE Place 1 spray into both nostrils as needed for allergies.   glucose blood test strip OneTouch Ultra Blue Test Strip  USE TO TEST BLOOD SUGAR ONCE DAILY   hydrOXYzine 25 MG capsule Commonly known as: VISTARIL Take 25 mg by mouth at bedtime as needed.   LORazepam 0.5 MG tablet Commonly known as: ATIVAN Take 0.5 mg by mouth at bedtime.   losartan-hydrochlorothiazide 100-12.5 MG tablet Commonly known as: HYZAAR Take 1 tablet by mouth daily.   meclizine 25 MG tablet Commonly known as: ANTIVERT Take 25 mg by mouth daily as needed for dizziness.   metFORMIN 500 MG 24 hr tablet Commonly known as: GLUCOPHAGE-XR Take 500 mg by mouth 2 (two) times daily.   moxifloxacin 0.5 % ophthalmic solution Commonly known as: VIGAMOX Place 1 drop into the right eye  See admin instructions. Place 1 drop in right eye QID the day before, day of, and day after treatment.   rosuvastatin 20 MG tablet Commonly known as: CRESTOR Take 20 mg by mouth at bedtime.   sildenafil 50 MG tablet Commonly known as: VIAGRA Take 50 mg by mouth daily as needed.   testosterone cypionate 200 MG/ML injection Commonly known as: DEPOTESTOSTERONE CYPIONATE Inject 200 mg into the muscle every 21 ( twenty-one) days.   Trulicity 1.5 MG/0.5ML Sopn Generic drug: Dulaglutide once a week.   valsartan-hydrochlorothiazide 320-25 MG tablet Commonly known as: DIOVAN-HCT Take 1 tablet by mouth daily.   Xarelto 10 MG Tabs tablet Generic drug: rivaroxaban TAKE 1 TABLET BY MOUTH EVERY DAY        Allergies:  Allergies  Allergen Reactions   Ibuprofen Anaphylaxis and Other (See Comments)    Upper GI  Bled   Nsaids Other (See Comments)    GI Bleed    Past Medical History, Surgical history, Social history, and Family History were reviewed and updated.  Review of Systems: Review of Systems  Constitutional: Negative.   HENT: Negative.    Eyes: Negative.   Respiratory: Negative.    Cardiovascular: Negative.   Gastrointestinal: Negative.   Genitourinary: Negative.   Musculoskeletal: Negative.   Skin: Negative.   Neurological: Negative.   Endo/Heme/Allergies: Negative.   Psychiatric/Behavioral: Negative.      Physical Exam:  height is 5\' 6"  (1.676 m) and weight is 185 lb (83.9 kg). His oral temperature is 98.2 F (36.8 C). His blood pressure is 131/68 and his pulse is 74. His respiration is 20 and oxygen saturation is 96%.   Wt Readings from Last 3 Encounters:  06/27/23 185 lb (83.9 kg)  03/28/23 184 lb 1.9 oz (83.5 kg)  01/30/23 172 lb (78 kg)    Physical Exam Vitals reviewed.  HENT:     Head: Normocephalic and atraumatic.  Eyes:     Pupils: Pupils are equal, round, and reactive to light.  Cardiovascular:     Rate and Rhythm: Normal rate and regular rhythm.     Heart sounds: Normal heart sounds.  Pulmonary:     Effort: Pulmonary effort is normal.     Breath sounds: Normal breath sounds.  Abdominal:     General: Bowel sounds are normal.     Palpations: Abdomen is soft.  Musculoskeletal:        General: No tenderness or deformity. Normal range of motion.     Cervical back: Normal range of motion.  Lymphadenopathy:     Cervical: No cervical adenopathy.  Skin:    General: Skin is warm and dry.     Findings: No erythema or rash.  Neurological:     Mental Status: He is alert and oriented to person, place, and time.  Psychiatric:        Behavior: Behavior normal.        Thought Content: Thought content normal.        Judgment: Judgment normal.      Lab Results  Component Value Date   WBC 6.2 06/27/2023   HGB 15.0 06/27/2023   HCT 49.5 06/27/2023   MCV  87.5 06/27/2023   PLT 242 06/27/2023   Lab Results  Component Value Date   FERRITIN 14 (L) 03/28/2023   IRON 134 03/28/2023   TIBC 424 03/28/2023   UIBC 290 03/28/2023   IRONPCTSAT 32 03/28/2023   Lab Results  Component Value Date   RETICCTPCT 2.7 12/27/2022  RBC 5.66 06/27/2023   RETICCTABS 94.0 08/30/2011   No results found for: "KPAFRELGTCHN", "LAMBDASER", "[facility]" Lab Results  Component Value Date   IGGSERUM 828 10/11/2016   IGA 177 06/22/2016   No results found for: "TOTALPROTELP", "ALBUMINELP", "A1GS", "A2GS", "BETS", "BETA2SER", "GAMS", "MSPIKE", "SPEI"   Chemistry      Component Value Date/Time   NA 137 06/27/2023 1001   NA 137 10/29/2017 1307   NA 134 (L) 05/10/2017 0755   K 4.7 06/27/2023 1001   K 3.6 10/29/2017 1307   K 4.2 05/10/2017 0755   CL 103 06/27/2023 1001   CL 101 10/29/2017 1307   CO2 28 06/27/2023 1001   CO2 25 10/29/2017 1307   CO2 20 (L) 05/10/2017 0755   BUN 16 06/27/2023 1001   BUN 14 10/29/2017 1307   BUN 21.0 05/10/2017 0755   CREATININE 1.55 (H) 06/27/2023 1001   CREATININE 1.4 (H) 10/29/2017 1307   CREATININE 1.3 05/10/2017 0755      Component Value Date/Time   CALCIUM 10.8 (H) 06/27/2023 1001   CALCIUM 10.3 10/29/2017 1307   CALCIUM 10.3 05/10/2017 0755   ALKPHOS 84 06/27/2023 1001   ALKPHOS 77 10/29/2017 1307   ALKPHOS 97 05/10/2017 0755   AST 19 06/27/2023 1001   AST 25 05/10/2017 0755   ALT 13 06/27/2023 1001   ALT 27 10/29/2017 1307   ALT 26 05/10/2017 0755   BILITOT 0.7 06/27/2023 1001   BILITOT 0.70 05/10/2017 0755     Impression and Plan: Mr. Landini is 77 year old gentleman with a long history of polycythemia. He is JAK2 negative.  He has transformed over to [facility]  He is on Sprycel.  He is responding quite nicely.  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  We will go ahead and phlebotomize him.  His hemoglobin/hematocrit is on the high side.  I think this will probably help with some of  his laboratory parameters.  He will continue on the Sprycel.  He is doing very nicely with this.  We see him back every 3 months.  This is a good interval for follow-up.    Josph Macho, MD 7/10/202411:17 AM

## 2023-06-27 NOTE — Progress Notes (Signed)
James [name]. presents today for phlebotomy per MD orders. Phlebotomy procedure started at 1145 and ended at 1215. 550 grams removed. Patient refused to stay was not observed for 30 minutes after procedure Patient tolerated procedure well. IV needle removed intact.  VSS

## 2023-06-27 NOTE — Patient Instructions (Signed)

## 2023-06-29 ENCOUNTER — Ambulatory Visit: Payer: Medicare Other | Attending: Family Medicine | Admitting: Physical Therapy

## 2023-06-29 ENCOUNTER — Encounter: Payer: Self-pay | Admitting: Physical Therapy

## 2023-06-29 VITALS — BP 132/69 | HR 72

## 2023-06-29 DIAGNOSIS — M6281 Muscle weakness (generalized): Secondary | ICD-10-CM | POA: Insufficient documentation

## 2023-06-29 DIAGNOSIS — R262 Difficulty in walking, not elsewhere classified: Secondary | ICD-10-CM | POA: Insufficient documentation

## 2023-06-29 DIAGNOSIS — R2681 Unsteadiness on feet: Secondary | ICD-10-CM | POA: Insufficient documentation

## 2023-06-29 DIAGNOSIS — R42 Dizziness and giddiness: Secondary | ICD-10-CM | POA: Diagnosis present

## 2023-06-29 NOTE — Therapy (Signed)
OUTPATIENT PHYSICAL THERAPY VESTIBULAR EVALUATION     Patient Name: James [name]. 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)    History   Portal vein thrombosis    Rhinitis    Situational depression    Sleep apnea    uses CPAP every night   Tubular adenoma 12/10/2015   6 cecum polyps   Past Surgical History:  Procedure Laterality Date   CARDIAC CATHETERIZATION     greater 10 yrs ago, normal   CARPAL TUNNEL RELEASE     bilateral   COLONOSCOPY  06/2005   [facility]   ESOPHAGOGASTRODUODENOSCOPY (EGD) WITH PROPOFOL N/A 09/28/2016   Procedure: ESOPHAGOGASTRODUODENOSCOPY (EGD) WITH PROPOFOL;  Surgeon: Iva Boop, MD;  Location: [facility];  Service: Endoscopy;  Laterality: N/A;   IR GENERIC HISTORICAL  12/12/2016   IR US GUIDE VASC ACCESS RIGHT 12/12/2016 Jolaine Click, MD [facility]   IR GENERIC HISTORICAL  12/12/2016    IR VENOGRAM HEPATIC W HEMODYNAMIC EVALUATION 12/12/2016 Jolaine Click, MD [facility]   IR GENERIC HISTORICAL  12/12/2016   IR TRANSCATHETER BX 12/12/2016 Jolaine Click, MD [facility]   REVERSE SHOULDER ARTHROPLASTY Right 09/01/2021   Procedure: REVERSE SHOULDER ARTHROPLASTY;  Surgeon: Francena Hanly, MD;  Location: [facility];  Service: Orthopedics;  Laterality: Right;  ,   ROTATOR CUFF REPAIR     right   TENDON REPAIR     left arm   TONSILLECTOMY     TOTAL KNEE ARTHROPLASTY Right 2010   WISDOM TOOTH EXTRACTION     Patient Active Problem List   Diagnosis Date Noted   DDD (degenerative disc disease), cervical 01/30/2023   Pre-syncope 01/30/2023   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 01/30/2023   CPAP use counseling 01/30/2023   Acute metabolic encephalopathy 09/03/2021   Acute encephalopathy 09/02/2021   Type 2 diabetes mellitus with hyperglycemia (HCC) 09/02/2021   SIRS (systemic inflammatory response syndrome) (HCC) 09/02/2021   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   OSA on CPAP 10/30/2017   Hypogonadism, male 10/30/2017   Iron deficiency anemia due to chronic blood loss 11/20/2016  Erosive gastritis with hemorrhage    Melena    Acute blood loss anemia 09/27/2016   Acute upper GI bleed 09/27/2016   Abnormal liver diagnostic imaging 06/22/2016   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/03/2016   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   Portal vein thrombosis 04/29/2016   Diabetes mellitus (HCC) 04/29/2016   Leukocytosis 04/29/2016   Dyspnea 12/31/2015   Essential hypertension 12/31/2015   Hyperlipidemia 12/31/2015   Nocturia more than twice per night 11/11/2014   Severe obesity (BMI >= 40) (HCC) 11/11/2014   Hypersomnia with sleep apnea 11/11/2014   Polycythemia vera (HCC) 11/29/2011    PCP: [name], MD REFERRING PROVIDER: Olevia Perches, NP  REFERRING DIAG: R42 (ICD-10-CM) - Dizziness and  giddiness  THERAPY DIAG:  Unsteadiness on feet  Difficulty in walking, not elsewhere classified  Muscle weakness (generalized)  Dizziness and giddiness  ONSET DATE: 06/18/2023 (referral date)  Rationale for Evaluation and Treatment: Rehabilitation  SUBJECTIVE:   SUBJECTIVE STATEMENT: Patient reports that he is not familiar with most recent imaging findings from February. States he was previously seen and treated here for dizziness/crystals out of place. Patient reports that when he bends over to tie his shoe he feels like he is falling forward he is losing his balance. He also feels like when he moves to quickly he gets unsteady as well. States things have progressively gotten worse in recent months. Reports being independent at baseline.   Pt accompanied by: self  PERTINENT HISTORY: polycythemia vera (HCC) in remission, basilar arterial dolichoectasia, chronic right PCA infarct  PAIN:  Are you having pain? No  PRECAUTIONS: Fall  RED FLAGS: Denies changes in bowel bladder function.   WEIGHT BEARING RESTRICTIONS: No  FALLS: Has patient fallen in last 6 months? No - Reports too many close calls to count, at least one time a week  LIVING ENVIRONMENT: Lives with: lives with their spouse Lives in: House/apartment Stairs: Yes: Internal: 13 steps; on left going up and External: 3 steps; on left going up Has following equipment at home: Single point cane, Walker - 2 wheeled, and Wheelchair (manual)  PLOF: Independent   PATIENT GOALS: "To get better with whatever you tell me to do. I would like to work on my balance."   OBJECTIVE:   DIAGNOSTIC FINDINGS:   01/27/2023 MR Brain IMPRESSION: 1. No acute intracranial abnormality. 2. Pronounced chronic arterial dolichoectasia, especially the Basilar artery. 3. Chronic ischemic disease, with mild progression of small vessel type changes since 2018. Stable chronic distal right PCA territory infarct. 4. Progressed cervical spine  degeneration since 2018  COGNITION: Overall cognitive status: Within functional limits for tasks assessed (Patient says his wife would say he had some memory changes)   SENSATION: WFL  EDEMA:  Denies swelling   MUSCLE TONE:  Grossly WNL, mild increase tightness noted on RLE does not appear to be increased tone during eval  DTRs:  WFL  POSTURE:  rounded shoulders and forward head  Cervical ROM:    Active A/PROM (deg) eval  Flexion WFL  Extension WFL  Right lateral flexion 80%  Left lateral flexion 80%  Right rotation 80%  Left rotation 80%  (Blank rows = not tested)  STRENGTH: Grossly 4/5 for LE bilaterallly for knee flexors/extensors and ankle dorsiflexors, 4-/5 for bilateral hip flexors  BED MOBILITY:  Independent   TRANSFERS: Assistive device utilized: None  Sit to stand: SBA Stand to sit: SBA Chair to chair: SBA  GAIT: Gait pattern:  unsteady, lateral lean- Left,  trunk flexed, and wide BOS Distance walked: 2 x 100 feet Assistive device utilized: None Level of assistance: SBA Comments: Unsteady, intermittent grab onto equipment throughout gym to increase stability particularly when first getting up  FUNCTIONAL TESTS:     [facility] PT Assessment - 06/29/23 0001       Standardized Balance Assessment   Standardized Balance Assessment Five Times Sit to Stand;10 meter walk test    Five times sit to stand comments  10.99   seconds without UE use (SBA)   10 Meter Walk 0.69   m/s without AD (SBA)             M-CTSIB  Condition 1: Firm Surface, EO 30 Sec, Normal Sway  Condition 2: Firm Surface, EC 30 Sec, Normal Sway  Condition 3: Foam Surface, EO 30 Sec, Min Sway  Condition 4: Foam Surface, EC 10 Sec, Max Sway     PATIENT SURVEYS:  FOTO Not indicated  VESTIBULAR ASSESSMENT:  GENERAL OBSERVATION: WBOS for gait, ambulates without AD   SYMPTOM BEHAVIOR: Subjective history: reports feeling more unsteady when bending over the last year/six months,  reports that symptoms are worse when he has been still   Non-Vestibular symptoms:  denies all  Type of dizziness: Imbalance (Disequilibrium)  Frequency: multiple times a week  Duration: a few seconds  Aggravating factors:  moving too quickly, bending over, turning to quickly  Relieving factors:  resting  Progression of symptoms: better  OCULOMOTOR EXAM: Ocular Alignment: normal Reports needing injection for R eye due to retina problem, R eye slightly larger than L  Ocular ROM: No Limitations  Spontaneous Nystagmus: absent  Gaze-Induced Nystagmus: absent  Smooth Pursuits: saccades - very mild  Saccades: slow - mild delay in speed  Convergence/Divergence: < 5 cm   Test of Skew: negative bilaterally  VESTIBULAR - OCULAR REFLEX:  Held further vestibular testing due to known blood flow issues at baseline; remainder of session spent assessing balance   VESTIBULAR TREATMENT:                                                                                                    TherAct: Discussed imaging results impact on POC as known central findings/blood flow involvement likely explaining some of symptoms. Followed up with patient in front how to set up MyChart/showed written instructions to help patient have easier access to information.   PATIENT EDUCATION: Education details: POC, examination findings, goal collaboration, results Person educated: Patient Education method: Explanation and Handouts Education comprehension: verbalized understanding and needs further education  HOME EXERCISE PROGRAM:  To be provided  GOALS: Goals reviewed with patient? Yes  SHORT TERM GOALS: Target date: 07/20/2023  Patient will demonstrate 100% compliance with initial HEP to continue to progress between physical therapy sessions.   Baseline: To be provided Goal status: INITIAL  2.  Therapist will assess and write LTG for FGA Baseline: To be assessed Goal status: INITIAL  LONG TERM GOALS: Target  date: 08/10/2023  Patient will report demonstrate independence with final HEP in order to maintain current gains and continue to progress after physical  therapy discharge.   Baseline: To be provided Goal status: INITIAL  2.  Patient will improve gait speed to 0.8 m/s to indicate improvement to the level of community ambulator in order to participate more easily in activities outside of the home.   Baseline: 0.69 m/s without AD Goal status: INITIAL  3.  Patient will improve mCTSIB score to 120/120 to indicate improved integration of vestibular, proprioceptive, and visual balance systems during static balance tasks in order to reduce risk for falls.   Baseline: 100/120 Goal status: INITIAL  4.  FGA to be assessed/goal written Baseline: To be assessed  Goal status: INITIAL  ASSESSMENT:  CLINICAL IMPRESSION: Patient is a 77 y.o. male who was seen today for physical therapy evaluation and treatment for dizziness/imbalance with PMH of polycythemia vera (HCC) in remission, vertebrobasilar arterial dolichoectasia, chronic right PCA infarct. Given known blood flow involvement due to vertebrobasilar arterial dolichoectasia that can lead to vertigo spells; therapist held full vestibular assessment to prevent from exacerbating/provoking these symptoms. Session primarily spent on quick occulomotor screen and balance assessment. Patient presents with mild occulomotor deficits but nearly [facility] for age matched norms and may be impacted by known retinal involvement. Pat[place] is at an increased risk for falls as indicated by 10 meter walk test and mCTSIB results with poor integration of vestibular system. Patient will benefit from skilled physical therapy services to address impairment in order to maximize function and improve safety.   OBJECTIVE IMPAIRMENTS: Abnormal gait, decreased balance, decreased endurance, decreased mobility, difficulty walking, decreased ROM, and decreased strength.   ACTIVITY  LIMITATIONS: carrying, bending, and reach over head  PARTICIPATION LIMITATIONS: laundry, community activity, yard work, and getting dressed particularly putting on shoes  PERSONAL FACTORS: Age, Time since onset of injury/illness/exacerbation, and 3+ comorbidities: see above  are also affecting patient's functional outcome.   REHAB POTENTIAL: Good  CLINICAL DECISION MAKING: Evolving/moderate complexity  EVALUATION COMPLEXITY: Moderate   PLAN:  PT FREQUENCY: 1x/week  PT DURATION: 6 weeks  PLANNED INTERVENTIONS: Therapeutic exercises, Therapeutic activity, Neuromuscular re-education, Balance training, Gait training, Patient/Family education, Self Care, Vestibular training, DME instructions, and Re-evaluation  PLAN FOR NEXT SESSION: assess FGA/write LTG, create initial HEP working on balance on compliant surface, assess stair/curb navigation, modified SLS activities   Carmelia Bake, PT, DPT 06/29/2023, 3:57 PM

## 2023-07-03 ENCOUNTER — Ambulatory Visit: Payer: Medicare Other | Admitting: Physical Therapy

## 2023-07-03 ENCOUNTER — Encounter: Payer: Self-pay | Admitting: Physical Therapy

## 2023-07-03 VITALS — BP 127/71 | HR 70

## 2023-07-03 DIAGNOSIS — R262 Difficulty in walking, not elsewhere classified: Secondary | ICD-10-CM

## 2023-07-03 DIAGNOSIS — M6281 Muscle weakness (generalized): Secondary | ICD-10-CM

## 2023-07-03 DIAGNOSIS — R2681 Unsteadiness on feet: Secondary | ICD-10-CM

## 2023-07-03 DIAGNOSIS — R42 Dizziness and giddiness: Secondary | ICD-10-CM

## 2023-07-03 NOTE — Therapy (Signed)
OUTPATIENT PHYSICAL THERAPY VESTIBULAR TREATMENT   Patient Name: James [name]. 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)    History   Portal vein thrombosis    Rhinitis    Situational depression    Sleep apnea    uses CPAP every night   Tubular adenoma 12/10/2015   6 cecum polyps   Past Surgical History:  Procedure Laterality Date   CARDIAC CATHETERIZATION     greater 10 yrs ago, normal   CARPAL TUNNEL RELEASE     bilateral   COLONOSCOPY  06/2005   [facility] [place]   ESOPHAGOGASTRODUODENOSCOPY (EGD) WITH PROPOFOL N/A 09/28/2016   Procedure: ESOPHAGOGASTRODUODENOSCOPY (EGD) WITH PROPOFOL;  Surgeon: Iva Boop, MD;  Location: [facility];  Service: Endoscopy;  Laterality: N/A;   IR GENERIC HISTORICAL  12/12/2016   IR US GUIDE VASC ACCESS RIGHT 12/12/2016 Jolaine Click, MD [facility]   IR GENERIC HISTORICAL  12/12/2016   IR  VENOGRAM HEPATIC W HEMODYNAMIC EVALUATION 12/12/2016 Jolaine Click, MD [facility]   IR GENERIC HISTORICAL  12/12/2016   IR TRANSCATHETER BX 12/12/2016 Jolaine Click, MD [facility]   REVERSE SHOULDER ARTHROPLASTY Right 09/01/2021   Procedure: REVERSE SHOULDER ARTHROPLASTY;  Surgeon: Francena Hanly, MD;  Location: [facility];  Service: Orthopedics;  Laterality: Right;  ,   ROTATOR CUFF REPAIR     right   TENDON REPAIR     left arm   TONSILLECTOMY     TOTAL KNEE ARTHROPLASTY Right 2010   WISDOM TOOTH EXTRACTION     Patient Active Problem List   Diagnosis Date Noted   DDD (degenerative disc disease), cervical 01/30/2023   Pre-syncope 01/30/2023   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 01/30/2023   CPAP use counseling 01/30/2023   Acute metabolic encephalopathy 09/03/2021   Acute encephalopathy 09/02/2021   Type 2 diabetes mellitus with hyperglycemia (HCC) 09/02/2021   SIRS (systemic inflammatory response syndrome) (HCC) 09/02/2021   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   OSA on CPAP 10/30/2017   Hypogonadism, male 10/30/2017   Iron deficiency anemia due to chronic blood loss 11/20/2016  Erosive gastritis with hemorrhage    Melena    Acute blood loss anemia 09/27/2016   Acute upper GI bleed 09/27/2016   Abnormal liver diagnostic imaging 06/22/2016   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/03/2016   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   Portal vein thrombosis 04/29/2016   Diabetes mellitus (HCC) 04/29/2016   Leukocytosis 04/29/2016   Dyspnea 12/31/2015   Essential hypertension 12/31/2015   Hyperlipidemia 12/31/2015   Nocturia more than twice per night 11/11/2014   Severe obesity (BMI >= 40) (HCC) 11/11/2014   Hypersomnia with sleep apnea 11/11/2014   Polycythemia vera (HCC) 11/29/2011    PCP: [name], MD REFERRING PROVIDER: Olevia Perches, NP  REFERRING DIAG: R42 (ICD-10-CM) - Dizziness and  giddiness  THERAPY DIAG:  Unsteadiness on feet  Difficulty in walking, not elsewhere classified  Muscle weakness (generalized)  Dizziness and giddiness  ONSET DATE: 06/18/2023 (referral date)  Rationale for Evaluation and Treatment: Rehabilitation  SUBJECTIVE:   SUBJECTIVE STATEMENT: Patient denies falls/near falls. Reports no other acute changes; patient states he felt okay after last time.   Pt accompanied by: self  PERTINENT HISTORY: polycythemia vera (HCC) in remission, basilar arterial dolichoectasia, chronic right PCA infarct  PAIN:  Are you having pain? No  PRECAUTIONS: Fall  RED FLAGS: Denies changes in bowel bladder function.   WEIGHT BEARING RESTRICTIONS: No  FALLS: Has patient fallen in last 6 months? No - Reports too many close calls to count, at least one time a week  LIVING ENVIRONMENT: Lives with: lives with their spouse Lives in: House/apartment Stairs: Yes: Internal: 13 steps; on left going up and External: 3 steps; on left going up Has following equipment at home: Single point cane, Walker - 2 wheeled, and Wheelchair (manual)  PLOF: Independent   PATIENT GOALS: "To get better with whatever you tell me to do. I would like to work on my balance."   OBJECTIVE:   DIAGNOSTIC FINDINGS:   01/27/2023 MR Brain IMPRESSION: 1. No acute intracranial abnormality. 2. Pronounced chronic arterial dolichoectasia, especially the Basilar artery. 3. Chronic ischemic disease, with mild progression of small vessel type changes since 2018. Stable chronic distal right PCA territory infarct. 4. Progressed cervical spine degeneration since 2018  COGNITION: Overall cognitive status: Within functional limits for tasks assessed (Patient says his wife would say he had some memory changes)   VESTIBULAR TREATMENT:                                                                                                    TherAct:   [facility] PT Assessment - 07/03/23 0001        Functional Gait  Assessment   Gait assessed  Yes    Gait Level Surface Walks 20 ft, slow speed, abnormal gait pattern, evidence for imbalance or deviates 10-15 in outside of the 12 in walkway width. Requires more than 7 sec to ambulate 20 ft.    Change in Gait Speed Able to change speed, demonstrates mild gait deviations, deviates 6-10 in outside of the 12 in walkway width, or no gait deviations, unable to  achieve a major change in velocity, or uses a change in velocity, or uses an assistive device.    Gait with Horizontal Head Turns Performs head turns smoothly with slight change in gait velocity (eg, minor disruption to smooth gait path), deviates 6-10 in outside 12 in walkway width, or uses an assistive device.    Gait with Vertical Head Turns Performs task with slight change in gait velocity (eg, minor disruption to smooth gait path), deviates 6 - 10 in outside 12 in walkway width or uses assistive device    Gait and Pivot Turn Pivot turns safely in greater than 3 sec and stops with no loss of balance, or pivot turns safely within 3 sec and stops with mild imbalance, requires small steps to catch balance.    Step Over Obstacle Is able to step over one shoe box (4.5 in total height) but must slow down and adjust steps to clear box safely. May require verbal cueing.    Gait with Narrow Base of Support Ambulates 7-9 steps.   7 steps (close guarding but no assist)   Gait with Eyes Closed Walks 20 ft, slow speed, abnormal gait pattern, evidence for imbalance, deviates 10-15 in outside 12 in walkway width. Requires more than 9 sec to ambulate 20 ft.    Ambulating Backwards Walks 20 ft, uses assistive device, slower speed, mild gait deviations, deviates 6-10 in outside 12 in walkway width.    Steps Alternating feet, must use rail.    Total Score 17    FGA comment: 17/30 = falls risk            Initiated [employer] program from [facility] (discussed use of 2WW when  completing)  NMR:  Corner Balance: - EC NBOS on firm 1 x 30" - Standing Romberg to 1/2 Tandem Stance with EC-  3 sets - 10 reps  Walker verus No AD Balance Comparision: RAMP:  Level of Assistance: SBA Assistive device utilized: Environmental consultant - 2 wheeled and None Ramp Comments: Improved pacing with 2WW  CURB:  Level of Assistance: SBA Assistive device utilized: Environmental consultant - 2 wheeled and None Curb Comments: Appropriate navigation both with and without walker  2WW Walker Assessment: Gait pattern: step through pattern and trunk flexed Distance walked: 1 x 230 feet Assistive device utilized: Walker - 2 wheeled Level of assistance: Modified independence Comments: Improved stability and reduced trendelenburg when compared to patient's baseline gait without AD   PATIENT EDUCATION: Education details: Initial HEP + walking program Person educated: Patient Education method: Chief Technology Officer Education comprehension: verbalized understanding and needs further education  HOME EXERCISE PROGRAM:  Access Code: [email or web address] URL: [email or web address] Date: 07/03/2023 Prepared by: Maryruth Eve  Exercises - Standing Romberg to 1/2 Tandem Stance  - 1 x daily - 7 x weekly - 3 sets - 10 reps  Provided printout out [employer] Walking program from [facility]   GOALS: Goals reviewed with patient? Yes  SHORT TERM GOALS: Target date: 07/20/2023  Patient will demonstrate 100% compliance with initial HEP to continue to progress between physical therapy sessions.   Baseline: To be provided Goal status: INITIAL  2.  Therapist will assess and write LTG for FGA Baseline: Assessed on 07/03/2023 Goal status: MET  LONG TERM GOALS: Target date: 08/10/2023  Patient will report demonstrate independence with final HEP in order to maintain current gains and continue to progress after physical therapy discharge.   Baseline: To be provided Goal status: INITIAL  2.  Patient will improve gait speed to 0.8 m/s to indicate improvement to the level of community ambulator in order to participate more easily in activities outside of the home.   Baseline: 0.69 m/s without AD Goal status: INITIAL  3.  Patient will improve mCTSIB score to 120/120 to indicate improved integration of vestibular, proprioceptive, and visual balance systems during static balance tasks in order to reduce risk for falls.   Baseline: 100/120 Goal status: INITIAL  4.  Patient will improve FGA to greater than 22/30 to indicate a decreased risk of falls and improved dynamic stability.   Baseline: 17/30 Goal status: INITIAL  ASSESSMENT:  CLINICAL IMPRESSION: Session emphasized assessment of FGA, creation of initial HEP, and comparison of patient's balance with verus without walker with various dynamic tasks. Patient is at an increased risk for falls as indicated by FGA. Patient also demonstrates improved stability with use of 2WW particularly with further distances of ambulation. Initated walking program for patient to complete with his 2WW.  Patient will benefit from skilled physical therapy services to address impairment in order to maximize function and improve safety.   OBJECTIVE IMPAIRMENTS: Abnormal gait, decreased balance, decreased endurance, decreased mobility, difficulty walking, decreased ROM, and decreased strength.   ACTIVITY LIMITATIONS: carrying, bending, and reach over head  PARTICIPATION LIMITATIONS: laundry, community activity, yard work, and getting dressed particularly putting on shoes  PERSONAL FACTORS: Age, Time since onset of injury/illness/exacerbation, and 3+ comorbidities: see above  are also affecting patient's functional outcome.   REHAB POTENTIAL: Good  CLINICAL DECISION MAKING: Evolving/moderate complexity  EVALUATION COMPLEXITY: Moderate   PLAN:  PT FREQUENCY: 1x/week  PT DURATION: 6 weeks  PLANNED INTERVENTIONS: Therapeutic exercises, Therapeutic  activity, Neuromuscular re-education, Balance training, Gait training, Patient/Family education, Self Care, Vestibular training, DME instructions, and Re-evaluation  PLAN FOR NEXT SESSION: review initial HEP + progress working on balance on compliant surface, assess stair/curb navigation, modified SLS activities, blaze pods, how is walking program going?  Carmelia Bake, PT, DPT 07/03/2023, 10:39 AM

## 2023-07-09 ENCOUNTER — Telehealth: Payer: Self-pay | Admitting: *Deleted

## 2023-07-09 LAB — BCR/ABL

## 2023-07-09 NOTE — Telephone Encounter (Signed)
As noted below by Dr. Myna Hidalgo, there is no active chronic leukemia in the blood. This is great news!  He verbalized understanding.

## 2023-07-09 NOTE — Telephone Encounter (Signed)
-----   Message from James [name] sent at 07/09/2023  4:20 PM EDT ----- Please call and let him know that there is no active chronic leukemia in the blood.  Thanks.[name]

## 2023-07-13 ENCOUNTER — Ambulatory Visit: Payer: Medicare Other | Admitting: Physical Therapy

## 2023-07-13 ENCOUNTER — Encounter: Payer: Self-pay | Admitting: Physical Therapy

## 2023-07-13 VITALS — BP 140/76 | HR 76

## 2023-07-13 DIAGNOSIS — M6281 Muscle weakness (generalized): Secondary | ICD-10-CM

## 2023-07-13 DIAGNOSIS — R2681 Unsteadiness on feet: Secondary | ICD-10-CM

## 2023-07-13 DIAGNOSIS — R42 Dizziness and giddiness: Secondary | ICD-10-CM

## 2023-07-13 DIAGNOSIS — R262 Difficulty in walking, not elsewhere classified: Secondary | ICD-10-CM

## 2023-07-13 NOTE — Therapy (Signed)
OUTPATIENT PHYSICAL THERAPY VESTIBULAR TREATMENT   Patient Name: James [name]. 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)    History   Portal vein thrombosis    Rhinitis    Situational depression    Sleep apnea    uses CPAP every night   Tubular adenoma 12/10/2015   6 cecum polyps   Past Surgical History:  Procedure Laterality Date   CARDIAC CATHETERIZATION     greater 10 yrs ago, normal   CARPAL TUNNEL RELEASE     bilateral   COLONOSCOPY  06/2005   [facility] [place]   ESOPHAGOGASTRODUODENOSCOPY (EGD) WITH PROPOFOL N/A 09/28/2016   Procedure: ESOPHAGOGASTRODUODENOSCOPY (EGD) WITH PROPOFOL;  Surgeon: Iva Boop, MD;  Location: [facility];  Service: Endoscopy;  Laterality: N/A;   IR GENERIC HISTORICAL  12/12/2016   IR US GUIDE VASC ACCESS RIGHT 12/12/2016 Jolaine Click, MD [facility]   IR GENERIC HISTORICAL  12/12/2016   IR  VENOGRAM HEPATIC W HEMODYNAMIC EVALUATION 12/12/2016 Jolaine Click, MD [facility]   IR GENERIC HISTORICAL  12/12/2016   IR TRANSCATHETER BX 12/12/2016 Jolaine Click, MD [facility]   REVERSE SHOULDER ARTHROPLASTY Right 09/01/2021   Procedure: REVERSE SHOULDER ARTHROPLASTY;  Surgeon: Francena Hanly, MD;  Location: [facility];  Service: Orthopedics;  Laterality: Right;  ,   ROTATOR CUFF REPAIR     right   TENDON REPAIR     left arm   TONSILLECTOMY     TOTAL KNEE ARTHROPLASTY Right 2010   WISDOM TOOTH EXTRACTION     Patient Active Problem List   Diagnosis Date Noted   DDD (degenerative disc disease), cervical 01/30/2023   Pre-syncope 01/30/2023   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 01/30/2023   CPAP use counseling 01/30/2023   Acute metabolic encephalopathy 09/03/2021   Acute encephalopathy 09/02/2021   Type 2 diabetes mellitus with hyperglycemia (HCC) 09/02/2021   SIRS (systemic inflammatory response syndrome) (HCC) 09/02/2021   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   OSA on CPAP 10/30/2017   Hypogonadism, male 10/30/2017   Iron deficiency anemia due to chronic blood loss 11/20/2016  Erosive gastritis with hemorrhage    Melena    Acute blood loss anemia 09/27/2016   Acute upper GI bleed 09/27/2016   Abnormal liver diagnostic imaging 06/22/2016   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/03/2016   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   Portal vein thrombosis 04/29/2016   Diabetes mellitus (HCC) 04/29/2016   Leukocytosis 04/29/2016   Dyspnea 12/31/2015   Essential hypertension 12/31/2015   Hyperlipidemia 12/31/2015   Nocturia more than twice per night 11/11/2014   Severe obesity (BMI >= 40) (HCC) 11/11/2014   Hypersomnia with sleep apnea 11/11/2014   Polycythemia vera (HCC) 11/29/2011    PCP: [name], MD REFERRING PROVIDER: Olevia Perches, NP  REFERRING DIAG: R42 (ICD-10-CM) - Dizziness and  giddiness  THERAPY DIAG:  Unsteadiness on feet  Difficulty in walking, not elsewhere classified  Muscle weakness (generalized)  Dizziness and giddiness  ONSET DATE: 06/18/2023 (referral date)  Rationale for Evaluation and Treatment: Rehabilitation  SUBJECTIVE:   SUBJECTIVE STATEMENT: Patient reports that he had multiple episodes where he got off balance but denies any falls/near falls. Patient denies changes to medications.   Pt accompanied by: self  PERTINENT HISTORY: polycythemia vera (HCC) in remission, basilar arterial dolichoectasia, chronic right PCA infarct  PAIN:  Are you having pain? No  PRECAUTIONS: Fall  RED FLAGS: Denies changes in bowel bladder function.   WEIGHT BEARING RESTRICTIONS: No  FALLS: Has patient fallen in last 6 months? No - Reports too many close calls to count, at least one time a week  LIVING ENVIRONMENT: Lives with: lives with their spouse Lives in: House/apartment Stairs: Yes: Internal: 13 steps; on left going up and External: 3 steps; on left going up Has following equipment at home: Single point cane, Walker - 2 wheeled, and Wheelchair (manual)  PLOF: Independent   PATIENT GOALS: "To get better with whatever you tell me to do. I would like to work on my balance."   OBJECTIVE:   DIAGNOSTIC FINDINGS:   01/27/2023 MR Brain IMPRESSION: 1. No acute intracranial abnormality. 2. Pronounced chronic arterial dolichoectasia, especially the Basilar artery. 3. Chronic ischemic disease, with mild progression of small vessel type changes since 2018. Stable chronic distal right PCA territory infarct. 4. Progressed cervical spine degeneration since 2018  COGNITION: Overall cognitive status: Within functional limits for tasks assessed (Patient says his wife would say he had some memory changes)   VESTIBULAR TREATMENT:                                                                                                    Vitals:   07/13/23 0726   BP: (!) 140/76  Pulse: 76   TherAct:  Rollator trial - Trialed rollator briefly with patient as an AD he can take more easily to grocery stores; patient does demonstrate notable forward tilt with device but is able to control it in today's session; recommend further trial before patient were to purchase to determine safety with it versus other AD  MyChart - Patient got mychart set up and therapist briefly reviewed with patient how to navigate through to see  results; provided written instructions to access in the future  Sit to stand for transfers 1 x 10 Squat tap for transfers x 8 (held due to knee pain)  NMR:  Corner Balance: - Standing Romberg to 1/2 Tandem Stance with EC-  3 sets - 30 seconds - NBOS EC with small range of motion horizontal head turns - 3 x 10 reps - Reactive Balance Traing  - step back off rocker board with UE support  - step back of rocker board reactive balance without UE support - reactive balance release with PT > 6-3 mini steps (will continue to require work to improve stepping reaction)  PATIENT EDUCATION: Education details: Updated HEP + how to use rollator/myChart Person educated: Patient Education method: Explanation and Handouts Education comprehension: verbalized understanding and needs further education  HOME EXERCISE PROGRAM:  Access Code: [email or web address] URL: [email or web address] Date: 07/13/2023 Prepared by: Maryruth Eve  Exercises - Standing Romberg to 1/2 Tandem Stance  - 1 x daily - 7 x weekly - 3 sets - 30 seconds hold - Corner Balance Feet Together: Eyes Closed With Head Turns  - 1 x daily - 7 x weekly - 3 sets - 10 reps  Provided printout out [employer] Walking program from [facility]   GOALS: Goals reviewed with patient? Yes  SHORT TERM GOALS: Target date: 07/20/2023  Patient will demonstrate 100% compliance with initial HEP to continue to progress between physical therapy sessions.   Baseline: To  be provided Goal status: INITIAL  2.  Therapist will assess and write LTG for FGA Baseline: Assessed on 07/03/2023 Goal status: MET  LONG TERM GOALS: Target date: 08/10/2023  Patient will report demonstrate independence with final HEP in order to maintain current gains and continue to progress after physical therapy discharge.   Baseline: To be provided Goal status: INITIAL  2.  Patient will improve gait speed to 0.8 m/s to indicate improvement to the level of community ambulator in order to participate more easily in activities outside of the home.   Baseline: 0.69 m/s without AD Goal status: INITIAL  3.  Patient will improve mCTSIB score to 120/120 to indicate improved integration of vestibular, proprioceptive, and visual balance systems during static balance tasks in order to reduce risk for falls.   Baseline: 100/120 Goal status: INITIAL  4.  Patient will improve FGA to greater than 22/30 to indicate a decreased risk of falls and improved dynamic stability.   Baseline: 17/30 Goal status: INITIAL  ASSESSMENT:  CLINICAL IMPRESSION: Session emphasized brief rollator trial and access to [employer] education. Therapist also updated patient's intial HEP and worked on reactive stepping. Limited carryover at end of session with reactive release test as patient still required ~4-6 mini steps to regain balance. Recommend further trial before patient were to purchase rollator to determine safety. Patient will benefit from skilled physical therapy services to address impairment in order to maximize function and improve safety.   OBJECTIVE IMPAIRMENTS: Abnormal gait, decreased balance, decreased endurance, decreased mobility, difficulty walking, decreased ROM, and decreased strength.   ACTIVITY LIMITATIONS: carrying, bending, and reach over head  PARTICIPATION LIMITATIONS: laundry, community activity, yard work, and getting dressed particularly putting on shoes  PERSONAL FACTORS: Age, Time  since onset of injury/illness/exacerbation, and 3+ comorbidities: see above  are also affecting patient's functional outcome.   REHAB POTENTIAL: Good  CLINICAL DECISION MAKING: Evolving/moderate complexity  EVALUATION COMPLEXITY: Moderate   PLAN:  PT FREQUENCY: 1x/week  PT DURATION: 6 weeks  PLANNED INTERVENTIONS: Therapeutic exercises,  Therapeutic activity, Neuromuscular re-education, Balance training, Gait training, Patient/Family education, Self Care, Vestibular training, DME instructions, and Re-evaluation  PLAN FOR NEXT SESSION: progress working on balance on compliant surface, stepping strategy, reactive balance,  modified SLS activities, blaze pods, how is walking program going?, possible further trial of rollator for safety  Carmelia Bake, PT, DPT 07/13/2023, 8:17 AM

## 2023-07-20 ENCOUNTER — Ambulatory Visit: Payer: Medicare Other | Attending: Family Medicine | Admitting: Physical Therapy

## 2023-07-20 DIAGNOSIS — R2681 Unsteadiness on feet: Secondary | ICD-10-CM | POA: Insufficient documentation

## 2023-07-20 DIAGNOSIS — M6281 Muscle weakness (generalized): Secondary | ICD-10-CM | POA: Diagnosis present

## 2023-07-20 DIAGNOSIS — R262 Difficulty in walking, not elsewhere classified: Secondary | ICD-10-CM | POA: Diagnosis present

## 2023-07-20 DIAGNOSIS — R42 Dizziness and giddiness: Secondary | ICD-10-CM | POA: Insufficient documentation

## 2023-07-20 NOTE — Therapy (Signed)
OUTPATIENT PHYSICAL THERAPY VESTIBULAR TREATMENT   Patient Name: James [name]. 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)    History   Portal vein thrombosis    Rhinitis    Situational depression    Sleep apnea    uses CPAP every night   Tubular adenoma 12/10/2015   6 cecum polyps   Past Surgical History:  Procedure Laterality Date   CARDIAC CATHETERIZATION     greater 10 yrs ago, normal   CARPAL TUNNEL RELEASE     bilateral   COLONOSCOPY  06/2005   [facility] [place]   ESOPHAGOGASTRODUODENOSCOPY (EGD) WITH PROPOFOL N/A 09/28/2016   Procedure: ESOPHAGOGASTRODUODENOSCOPY (EGD) WITH PROPOFOL;  Surgeon: Iva Boop, MD;  Location: [facility];  Service: Endoscopy;  Laterality: N/A;   IR GENERIC HISTORICAL  12/12/2016   IR US GUIDE VASC ACCESS RIGHT 12/12/2016 Jolaine Click, MD [facility]   IR GENERIC HISTORICAL  12/12/2016   IR  VENOGRAM HEPATIC W HEMODYNAMIC EVALUATION 12/12/2016 Jolaine Click, MD [facility]   IR GENERIC HISTORICAL  12/12/2016   IR TRANSCATHETER BX 12/12/2016 Jolaine Click, MD [facility]   REVERSE SHOULDER ARTHROPLASTY Right 09/01/2021   Procedure: REVERSE SHOULDER ARTHROPLASTY;  Surgeon: Francena Hanly, MD;  Location: [facility];  Service: Orthopedics;  Laterality: Right;  ,   ROTATOR CUFF REPAIR     right   TENDON REPAIR     left arm   TONSILLECTOMY     TOTAL KNEE ARTHROPLASTY Right 2010   WISDOM TOOTH EXTRACTION     Patient Active Problem List   Diagnosis Date Noted   DDD (degenerative disc disease), cervical 01/30/2023   Pre-syncope 01/30/2023   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 01/30/2023   CPAP use counseling 01/30/2023   Acute metabolic encephalopathy 09/03/2021   Acute encephalopathy 09/02/2021   Type 2 diabetes mellitus with hyperglycemia (HCC) 09/02/2021   SIRS (systemic inflammatory response syndrome) (HCC) 09/02/2021   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   OSA on CPAP 10/30/2017   Hypogonadism, male 10/30/2017   Iron deficiency anemia due to chronic blood loss 11/20/2016  Erosive gastritis with hemorrhage    Melena    Acute blood loss anemia 09/27/2016   Acute upper GI bleed 09/27/2016   Abnormal liver diagnostic imaging 06/22/2016   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/03/2016   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   Portal vein thrombosis 04/29/2016   Diabetes mellitus (HCC) 04/29/2016   Leukocytosis 04/29/2016   Dyspnea 12/31/2015   Essential hypertension 12/31/2015   Hyperlipidemia 12/31/2015   Nocturia more than twice per night 11/11/2014   Severe obesity (BMI >= 40) (HCC) 11/11/2014   Hypersomnia with sleep apnea 11/11/2014   Polycythemia vera (HCC) 11/29/2011    PCP: [name], MD REFERRING PROVIDER: Olevia Perches, NP  REFERRING DIAG: R42 (ICD-10-CM) - Dizziness and  giddiness  THERAPY DIAG:  Unsteadiness on feet  Difficulty in walking, not elsewhere classified  Muscle weakness (generalized)  Dizziness and giddiness  ONSET DATE: 06/18/2023 (referral date)  Rationale for Evaluation and Treatment: Rehabilitation  SUBJECTIVE:   SUBJECTIVE STATEMENT: Notices balance is getting a lot better. No falls. Notes exercises are going well at home. Does not want to try the rollator again today.   Pt accompanied by: self  PERTINENT HISTORY: polycythemia vera (HCC) in remission, basilar arterial dolichoectasia, chronic right PCA infarct  PAIN:  Are you having pain? No  PRECAUTIONS: Fall  RED FLAGS: Denies changes in bowel bladder function.   WEIGHT BEARING RESTRICTIONS: No  FALLS: Has patient fallen in last 6 months? No - Reports too many close calls to count, at least one time a week  LIVING ENVIRONMENT: Lives with: lives with their spouse Lives in: House/apartment Stairs: Yes: Internal: 13 steps; on left going up and External: 3 steps; on left going up Has following equipment at home: Single point cane, Walker - 2 wheeled, and Wheelchair (manual)  PLOF: Independent   PATIENT GOALS: "To get better with whatever you tell me to do. I would like to work on my balance."   OBJECTIVE:   DIAGNOSTIC FINDINGS:   01/27/2023 MR Brain IMPRESSION: 1. No acute intracranial abnormality. 2. Pronounced chronic arterial dolichoectasia, especially the Basilar artery. 3. Chronic ischemic disease, with mild progression of small vessel type changes since 2018. Stable chronic distal right PCA territory infarct. 4. Progressed cervical spine degeneration since 2018  COGNITION: Overall cognitive status: Within functional limits for tasks assessed (Patient says his wife would say he had some memory changes)   VESTIBULAR TREATMENT:                                                                                                    There were no vitals filed for  this visit.  NMR:  In // bars: Tandem gait down and back x3 reps, cued for slowed pace  Marching down and back x4 reps, trying to hold SLS for 2-3 seconds On blue foam beam: Side stepping down and back x3 reps with intermittent UE support Alternating lateral stepping x10 reps each side for reactive stepping  Alternating posterior stepping off and on x10 reps, alternating forward stepping x10 reps for reactive stepping, pt more challenged in  the forwards direction With 2 cones on level ground, alternating SLS taps to 2 cones with focus on slowed pace x15 reps each side, pt needing intermittent UE support for balance. Pt asking about how he can do these at home, showed pt he can go to the bottom of the stair case at home (with a railing if needed) and perform alternating toe taps to first step    On air ex: 10 reps mini squats with no UE support With feet hip width x10 reps toss to floor and catch with 4# medicine ball, progressing to more narrow BOS, x10 reps, min guard as needed for balance  Wide BOS EC 4 x 20-30 seconds, pt improving with incr reps 1/2 tandem x30 seconds each leg   PATIENT EDUCATION: Education details: Continue HEP and addition of SLS taps at staircase Person educated: Patient Education method: Chief Technology [employer] Education comprehension: verbalized understanding and needs further education  HOME EXERCISE PROGRAM:  Access Code: [email or web address] URL: [email or web address] Date: 07/13/2023 Prepared by: Maryruth Eve  Exercises - Standing Romberg to 1/2 Tandem Stance  - 1 x daily - 7 x weekly - 3 sets - 30 seconds hold - Corner Balance Feet Together: Eyes Closed With Head Turns  - 1 x daily - 7 x weekly - 3 sets - 10 reps - Forward Step Touch  - 1 x daily - 7 x weekly - 1-2 sets - 10 reps at bottom of staircase   Provided printout out [employer] program from [facility]   GOALS: Goals reviewed with patient? Yes  SHORT  TERM GOALS: Target date: 07/20/2023  Patient will demonstrate 100% compliance with initial HEP to continue to progress between physical therapy sessions.   Baseline: To be provided Goal status: INITIAL  2.  Therapist will assess and write LTG for FGA Baseline: Assessed on 07/03/2023 Goal status: MET  LONG TERM GOALS: Target date: 08/10/2023  Patient will report demonstrate independence with final HEP in order to maintain current gains and continue to progress after physical therapy discharge.   Baseline: To be provided Goal status: INITIAL  2.  Patient will improve gait speed to 0.8 m/s to indicate improvement to the level of community ambulator in order to participate more easily in activities outside of the home.   Baseline: 0.69 m/s without AD Goal status: INITIAL  3.  Patient will improve mCTSIB score to 120/120 to indicate improved integration of vestibular, proprioceptive, and visual balance systems during static balance tasks in order to reduce risk for falls.   Baseline: 100/120 Goal status: INITIAL  4.  Patient will improve FGA to greater than 22/30 to indicate a decreased risk of falls and improved dynamic stability.   Baseline: 17/30 Goal status: INITIAL  ASSESSMENT:  CLINICAL IMPRESSION: Today's skilled session focused on balance strategies with SLS, unlevel surfaces, narrow BOS, and stepping reactions. Pt most challenged with balance tasks with EC and SLS (esp with slowed pace for control). Pt needing intermittent UE support for balance. Pt does report an improvement with balance since starting therapy. Pt did not wish to trial the rollator again today. Will continue to progress towards LTGs.   OBJECTIVE IMPAIRMENTS: Abnormal gait, decreased balance, decreased endurance, decreased mobility, difficulty walking, decreased ROM, and decreased strength.   ACTIVITY LIMITATIONS: carrying, bending, and reach over head  PARTICIPATION LIMITATIONS: laundry, community activity, yard  work, and getting dressed particularly putting on shoes  PERSONAL FACTORS: Age, Time since onset of injury/illness/exacerbation, and 3+ comorbidities: see above  are also affecting patient's functional outcome.   REHAB POTENTIAL: Good  CLINICAL DECISION MAKING: Evolving/moderate complexity  EVALUATION COMPLEXITY: Moderate   PLAN:  PT FREQUENCY: 1x/week  PT DURATION: 6 weeks  PLANNED INTERVENTIONS: Therapeutic exercises, Therapeutic activity, Neuromuscular re-education, Balance training, Gait training, Patient/Family education, Self Care, Vestibular training, DME instructions, and Re-evaluation  PLAN FOR NEXT SESSION: progress working on balance on compliant surface, stepping strategy, reactive balance,  modified SLS activities, blaze pods, how is walking program going?, possible further trial of rollator for safety  Drake Leach, PT, DPT 07/20/2023, 7:58 AM

## 2023-07-27 ENCOUNTER — Ambulatory Visit: Payer: Medicare Other | Admitting: Physical Therapy

## 2023-07-27 ENCOUNTER — Encounter: Payer: Self-pay | Admitting: Physical Therapy

## 2023-07-27 VITALS — BP 130/75 | HR 68

## 2023-07-27 DIAGNOSIS — M6281 Muscle weakness (generalized): Secondary | ICD-10-CM

## 2023-07-27 DIAGNOSIS — R262 Difficulty in walking, not elsewhere classified: Secondary | ICD-10-CM

## 2023-07-27 DIAGNOSIS — R42 Dizziness and giddiness: Secondary | ICD-10-CM

## 2023-07-27 DIAGNOSIS — R2681 Unsteadiness on feet: Secondary | ICD-10-CM

## 2023-07-27 NOTE — Therapy (Signed)
OUTPATIENT PHYSICAL THERAPY VESTIBULAR TREATMENT   Patient Name: James [name]. 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)    History   Portal vein thrombosis    Rhinitis    Situational depression    Sleep apnea    uses CPAP every night   Tubular adenoma 12/10/2015   6 cecum polyps   Past Surgical History:  Procedure Laterality Date   CARDIAC CATHETERIZATION     greater 10 yrs ago, normal   CARPAL TUNNEL RELEASE     bilateral   COLONOSCOPY  06/2005   [facility] [place]   ESOPHAGOGASTRODUODENOSCOPY (EGD) WITH PROPOFOL N/A 09/28/2016   Procedure: ESOPHAGOGASTRODUODENOSCOPY (EGD) WITH PROPOFOL;  Surgeon: Iva Boop, MD;  Location: [facility];  Service: Endoscopy;  Laterality: N/A;   IR GENERIC HISTORICAL  12/12/2016   IR US GUIDE VASC ACCESS RIGHT 12/12/2016 Jolaine Click, MD [facility]   IR GENERIC HISTORICAL  12/12/2016   IR  VENOGRAM HEPATIC W HEMODYNAMIC EVALUATION 12/12/2016 Jolaine Click, MD [facility]   IR GENERIC HISTORICAL  12/12/2016   IR TRANSCATHETER BX 12/12/2016 Jolaine Click, MD [facility]   REVERSE SHOULDER ARTHROPLASTY Right 09/01/2021   Procedure: REVERSE SHOULDER ARTHROPLASTY;  Surgeon: Francena Hanly, MD;  Location: [facility];  Service: Orthopedics;  Laterality: Right;  ,   ROTATOR CUFF REPAIR     right   TENDON REPAIR     left arm   TONSILLECTOMY     TOTAL KNEE ARTHROPLASTY Right 2010   WISDOM TOOTH EXTRACTION     Patient Active Problem List   Diagnosis Date Noted   DDD (degenerative disc disease), cervical 01/30/2023   Pre-syncope 01/30/2023   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 01/30/2023   CPAP use counseling 01/30/2023   Acute metabolic encephalopathy 09/03/2021   Acute encephalopathy 09/02/2021   Type 2 diabetes mellitus with hyperglycemia (HCC) 09/02/2021   SIRS (systemic inflammatory response syndrome) (HCC) 09/02/2021   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   OSA on CPAP 10/30/2017   Hypogonadism, male 10/30/2017   Iron deficiency anemia due to chronic blood loss 11/20/2016  Erosive gastritis with hemorrhage    Melena    Acute blood loss anemia 09/27/2016   Acute upper GI bleed 09/27/2016   Abnormal liver diagnostic imaging 06/22/2016   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/03/2016   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   Portal vein thrombosis 04/29/2016   Diabetes mellitus (HCC) 04/29/2016   Leukocytosis 04/29/2016   Dyspnea 12/31/2015   Essential hypertension 12/31/2015   Hyperlipidemia 12/31/2015   Nocturia more than twice per night 11/11/2014   Severe obesity (BMI >= 40) (HCC) 11/11/2014   Hypersomnia with sleep apnea 11/11/2014   Polycythemia vera (HCC) 11/29/2011    PCP: [name], MD REFERRING PROVIDER: Olevia Perches, NP  REFERRING DIAG: R42 (ICD-10-CM) - Dizziness and  giddiness  THERAPY DIAG:  Unsteadiness on feet  Difficulty in walking, not elsewhere classified  Muscle weakness (generalized)  Dizziness and giddiness  ONSET DATE: 06/18/2023 (referral date)  Rationale for Evaluation and Treatment: Rehabilitation  SUBJECTIVE:   SUBJECTIVE STATEMENT: Patient reports that this week was particularly challenging. He denies any falls/near falls that he remembers but felt more off balance. Patient used his walker more this week.  Pt accompanied by: self  PERTINENT HISTORY: polycythemia vera (HCC) in remission, basilar arterial dolichoectasia, chronic right PCA infarct  PAIN:  Are you having pain? No  PRECAUTIONS: Fall  RED FLAGS: Denies changes in bowel bladder function.   WEIGHT BEARING RESTRICTIONS: No  FALLS: Has patient fallen in last 6 months? No - Reports too many close calls to count, at least one time a week  LIVING ENVIRONMENT: Lives with: lives with their spouse Lives in: House/apartment Stairs: Yes: Internal: 13 steps; on left going up and External: 3 steps; on left going up Has following equipment at home: Single point cane, Walker - 2 wheeled, and Wheelchair (manual)  PLOF: Independent   PATIENT GOALS: "To get better with whatever you tell me to do. I would like to work on my balance."   OBJECTIVE:   DIAGNOSTIC FINDINGS:   01/27/2023 MR Brain IMPRESSION: 1. No acute intracranial abnormality. 2. Pronounced chronic arterial dolichoectasia, especially the Basilar artery. 3. Chronic ischemic disease, with mild progression of small vessel type changes since 2018. Stable chronic distal right PCA territory infarct. 4. Progressed cervical spine degeneration since 2018  COGNITION: Overall cognitive status: Within functional limits for tasks assessed (Patient says his wife would say he had some memory changes)   VESTIBULAR TREATMENT:                                                                                                     Vitals:   07/27/23 0722  BP: 130/75  Pulse: 68  SPO2: 93-95%   NMR:  Forward walking with ball toss to self (CGA) 1 x 115 feet Backward walking with ball toss to self (CGA) 2 x 50 feet with ball toss,2 x 50 feet without (catches feet x 4 times) Lateral stepping with ball toss to self (CGA) 4 x 30 feet  Rocker board EC 6 x 5-30" (greatest tendency towards posterior LOB, CGA-minA)  12" hurdle step  overs with attempted step through and regressed to step to with ball bounce to self (minA)   PATIENT EDUCATION: Education details: Continue HEP Person educated: Patient Education method: Chief Technology Officer Education comprehension: verbalized understanding and needs further education  HOME EXERCISE PROGRAM:  Access Code: [email or web address] URL: [email or web address] Date: 07/13/2023 Prepared by: Maryruth Eve  Exercises - Standing Romberg to 1/2 Tandem Stance  - 1 x daily - 7 x weekly - 3 sets - 30 seconds hold - Corner Balance Feet Together: Eyes Closed With Head Turns  - 1 x daily - 7 x weekly - 3 sets - 10 reps - Forward Step Touch  - 1 x daily - 7 x weekly - 1-2 sets - 10 reps at bottom of staircase   Provided printout out [employer] program from [facility]   GOALS: Goals reviewed with patient? Yes  SHORT TERM GOALS: Target date: 07/20/2023  Patient will demonstrate 100% compliance with initial HEP to continue to progress between physical therapy sessions.   Baseline: To be provided Goal status: INITIAL  2.  Therapist will assess and write LTG for FGA Baseline: Assessed on 07/03/2023 Goal status: MET  LONG TERM GOALS: Target date: 08/10/2023  Patient will report demonstrate independence with final HEP in order to maintain current gains and continue to progress after physical therapy discharge.   Baseline: To be provided Goal status: INITIAL  2.  Patient will improve gait speed to 0.8 m/s to indicate improvement to the level of  community ambulator in order to participate more easily in activities outside of the home.   Baseline: 0.69 m/s without AD Goal status: INITIAL  3.  Patient will improve mCTSIB score to 120/120 to indicate improved integration of vestibular, proprioceptive, and visual balance systems during static balance tasks in order to reduce risk for falls.   Baseline: 100/120 Goal status: INITIAL  4.  Patient will improve FGA to greater than 22/30 to indicate a decreased risk of falls and improved dynamic stability.   Baseline: 17/30 Goal status: INITIAL  ASSESSMENT:  CLINICAL IMPRESSION: Session emphasized work on multidirectional gait with emphasis on backward and lateral walking. Patient wearing different shoes during today's session and had 4x toe scuffs. Given difficulty in balance noted in today's session; therapist recommend patient use 2WW at all time. Will continue to progress towards LTGs.   OBJECTIVE IMPAIRMENTS: Abnormal gait, decreased balance, decreased endurance, decreased mobility, difficulty walking, decreased ROM, and decreased strength.   ACTIVITY LIMITATIONS: carrying, bending, and reach over head  PARTICIPATION LIMITATIONS: laundry, community activity, yard work, and getting dressed particularly putting on shoes  PERSONAL FACTORS: Age, Time since onset of injury/illness/exacerbation, and 3+ comorbidities: see above  are also affecting patient's functional outcome.   REHAB POTENTIAL: Good  CLINICAL DECISION MAKING: Evolving/moderate complexity  EVALUATION COMPLEXITY: Moderate   PLAN:  PT FREQUENCY: 1x/week  PT DURATION: 6 weeks  PLANNED INTERVENTIONS: Therapeutic exercises, Therapeutic activity, Neuromuscular re-education, Balance training, Gait training, Patient/Family education, Self Care, Vestibular training, DME instructions, and Re-evaluation  PLAN FOR NEXT SESSION: progress working on balance on compliant surface, stepping strategy, reactive balance,  modified  SLS activities, blaze pods, how is walking program going?, possible further trial of rollator for safety; recert for 1-2 visits, is patient using walker  Carmelia Bake, PT, DPT 07/27/2023, 8:10 AM

## 2023-08-03 ENCOUNTER — Ambulatory Visit: Payer: Medicare Other | Admitting: Physical Therapy

## 2023-08-03 ENCOUNTER — Encounter: Payer: Self-pay | Admitting: Physical Therapy

## 2023-08-03 VITALS — BP 121/73 | HR 83

## 2023-08-03 DIAGNOSIS — R2681 Unsteadiness on feet: Secondary | ICD-10-CM | POA: Diagnosis not present

## 2023-08-03 DIAGNOSIS — M6281 Muscle weakness (generalized): Secondary | ICD-10-CM

## 2023-08-03 DIAGNOSIS — R262 Difficulty in walking, not elsewhere classified: Secondary | ICD-10-CM

## 2023-08-03 NOTE — Therapy (Signed)
OUTPATIENT PHYSICAL THERAPY VESTIBULAR TREATMENT   Patient Name: James [name]. 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)    History   Portal vein thrombosis    Rhinitis    Situational depression    Sleep apnea    uses CPAP every night   Tubular adenoma 12/10/2015   6 cecum polyps   Past Surgical History:  Procedure Laterality Date   CARDIAC CATHETERIZATION     greater 10 yrs ago, normal   CARPAL TUNNEL RELEASE     bilateral   COLONOSCOPY  06/2005   [facility] [place]   ESOPHAGOGASTRODUODENOSCOPY (EGD) WITH PROPOFOL N/A 09/28/2016   Procedure: ESOPHAGOGASTRODUODENOSCOPY (EGD) WITH PROPOFOL;  Surgeon: Iva Boop, MD;  Location: [facility];  Service: Endoscopy;  Laterality: N/A;   IR GENERIC HISTORICAL  12/12/2016   IR US GUIDE VASC ACCESS RIGHT 12/12/2016 Jolaine Click, MD [facility]   IR GENERIC HISTORICAL  12/12/2016   IR  VENOGRAM HEPATIC W HEMODYNAMIC EVALUATION 12/12/2016 Jolaine Click, MD [facility]   IR GENERIC HISTORICAL  12/12/2016   IR TRANSCATHETER BX 12/12/2016 Jolaine Click, MD [facility]   REVERSE SHOULDER ARTHROPLASTY Right 09/01/2021   Procedure: REVERSE SHOULDER ARTHROPLASTY;  Surgeon: Francena Hanly, MD;  Location: [facility];  Service: Orthopedics;  Laterality: Right;  ,   ROTATOR CUFF REPAIR     right   TENDON REPAIR     left arm   TONSILLECTOMY     TOTAL KNEE ARTHROPLASTY Right 2010   WISDOM TOOTH EXTRACTION     Patient Active Problem List   Diagnosis Date Noted   DDD (degenerative disc disease), cervical 01/30/2023   Pre-syncope 01/30/2023   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 01/30/2023   CPAP use counseling 01/30/2023   Acute metabolic encephalopathy 09/03/2021   Acute encephalopathy 09/02/2021   Type 2 diabetes mellitus with hyperglycemia (HCC) 09/02/2021   SIRS (systemic inflammatory response syndrome) (HCC) 09/02/2021   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   OSA on CPAP 10/30/2017   Hypogonadism, male 10/30/2017   Iron deficiency anemia due to chronic blood loss 11/20/2016  Erosive gastritis with hemorrhage    Melena    Acute blood loss anemia 09/27/2016   Acute upper GI bleed 09/27/2016   Abnormal liver diagnostic imaging 06/22/2016   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/03/2016   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   Portal vein thrombosis 04/29/2016   Diabetes mellitus (HCC) 04/29/2016   Leukocytosis 04/29/2016   Dyspnea 12/31/2015   Essential hypertension 12/31/2015   Hyperlipidemia 12/31/2015   Nocturia more than twice per night 11/11/2014   Severe obesity (BMI >= 40) (HCC) 11/11/2014   Hypersomnia with sleep apnea 11/11/2014   Polycythemia vera (HCC) 11/29/2011    PCP: [name], MD REFERRING PROVIDER: Olevia Perches, NP  REFERRING DIAG: R42 (ICD-10-CM) - Dizziness and  giddiness  THERAPY DIAG:  Unsteadiness on feet  Difficulty in walking, not elsewhere classified  Muscle weakness (generalized)  ONSET DATE: 06/18/2023 (referral date)  Rationale for Evaluation and Treatment: Rehabilitation  SUBJECTIVE:   SUBJECTIVE STATEMENT: Nothing new or no almost falls. Using RW for balance - has one he keeps in the car and one he keeps at home.   Pt accompanied by: self  PERTINENT HISTORY: polycythemia vera (HCC) in remission, basilar arterial dolichoectasia, chronic right PCA infarct  PAIN:  Are you having pain? No  PRECAUTIONS: Fall  RED FLAGS: Denies changes in bowel bladder function.   WEIGHT BEARING RESTRICTIONS: No  FALLS: Has patient fallen in last 6 months? No - Reports too many close calls to count, at least one time a week  LIVING ENVIRONMENT: Lives with: lives with their spouse Lives in: House/apartment Stairs: Yes: Internal: 13 steps; on left going up and External: 3 steps; on left going up Has following equipment at home: Single point cane, Walker - 2 wheeled, and Wheelchair (manual)  PLOF: Independent   PATIENT GOALS: "To get better with whatever you tell me to do. I would like to work on my balance."   OBJECTIVE:   DIAGNOSTIC FINDINGS:   01/27/2023 MR Brain IMPRESSION: 1. No acute intracranial abnormality. 2. Pronounced chronic arterial dolichoectasia, especially the Basilar artery. 3. Chronic ischemic disease, with mild progression of small vessel type changes since 2018. Stable chronic distal right PCA territory infarct. 4. Progressed cervical spine degeneration since 2018  COGNITION: Overall cognitive status: Within functional limits for tasks assessed (Patient says his wife would say he had some memory changes)   VESTIBULAR TREATMENT:                                                                                                    Vitals:   08/03/23 0723  BP: 121/73  Pulse: 83   Pt brought in his RW from home and  height was too high for pt. Educated on proper RW height, but PT unable to adjust as notches were very stuck.   NMR:  With rebounder: Alternating forward stepping and tossing and catching ball x10 reps each side  With narrow BOS on air ex x15 reps tosses, pt with incr postural sway, but good use of ankle strategy to maintain balance  With 6 blaze pods in  a semi-circle for weight shifting, visual scanning, SLS, reaction times on random hit setting. Pt standing on air ex for balance 3 bouts of 1 minute: 26 hits, 35 hits, 37 hits (with 2 blaze pods on 6" surface for incr foot clearance) On air ex in corner: Feet hip width/narrow BOS EC 3 x 20-30 seconds, incr challenge with this Narrow BOS EO x10 reps head turns, x10 reps head nods (cues to just turn head as pt with tendency to move trunk as well) On red balance beam:  10 reps sit <> stands with no UE support for immediate standing balance Alternating posterior stepping strategy x10 reps each leg, pt with no UE support needed  On blue mat (each down and back x3 reps) Tandem gait Forward slow marching for SLS Backwards walking    PATIENT EDUCATION: Education details: Continue HEP, adding balance with EC on a pillow to  HEP  Person educated: Patient Education method: Chief Technology [employer] Education comprehension: verbalized understanding and needs further education  HOME EXERCISE PROGRAM:  Access Code: [email or web address] URL: [email or web address] Date: 08/03/2023 Prepared by: Sherlie Ban  Exercises - Standing Romberg to 1/2 Tandem Stance  - 1 x daily - 7 x weekly - 3 sets - 30 seconds hold - Corner Balance Feet Together: Eyes Closed With Head Turns  - 1 x daily - 7 x weekly - 3 sets - 10 reps - Forward Step Touch  - 1 x daily - 7 x weekly - 1-2 sets - 10 reps - Standing Balance with Eyes Closed on Foam  - 1 x daily - 5 x weekly - 3 sets - 30 hold  Provided printout out [employer] Walking program from  [facility]   GOALS: Goals reviewed with patient? Yes  SHORT TERM GOALS: Target date: 07/20/2023  Patient will demonstrate 100% compliance with initial HEP to continue to progress between physical therapy sessions.   Baseline: To be provided Goal status: INITIAL  2.  Therapist will assess and write LTG for FGA Baseline: Assessed on 07/03/2023 Goal status: MET  LONG TERM GOALS: Target date: 08/10/2023  Patient will report demonstrate independence with final HEP in order to maintain current gains and continue to progress after physical therapy discharge.   Baseline: To be provided Goal status: INITIAL  2.  Patient will improve gait speed to 0.8 m/s to indicate improvement to the level of community ambulator in order to participate more easily in activities outside of the home.   Baseline: 0.69 m/s without AD Goal status: INITIAL  3.  Patient will improve mCTSIB score to 120/120 to indicate improved integration of vestibular, proprioceptive, and visual balance systems during static balance tasks in order to reduce risk for falls.   Baseline: 100/120 Goal status: INITIAL  4.  Patient will improve FGA to greater than 22/30 to indicate a decreased risk of falls and improved dynamic stability.   Baseline: 17/30 Goal status: INITIAL  ASSESSMENT:  CLINICAL IMPRESSION: Pt brought in RW to session today from home, unable to adjust to proper height for pt as it is too high and notches were stuck. Continued to focus on balance tasks with SLS, unlevel surfaces, stepping strategies. Pt tolerated session well, most challenged by SLS activities and EC on foam for vestibular input. Added corner balance with EC on a pillow for home to address this. Will continue to progress towards LTGs.   OBJECTIVE IMPAIRMENTS: Abnormal gait, decreased balance, decreased endurance, decreased mobility, difficulty walking, decreased ROM, and decreased strength.  ACTIVITY LIMITATIONS: carrying, bending, and  reach over head  PARTICIPATION LIMITATIONS: laundry, community activity, yard work, and getting dressed particularly putting on shoes  PERSONAL FACTORS: Age, Time since onset of injury/illness/exacerbation, and 3+ comorbidities: see above  are also affecting patient's functional outcome.   REHAB POTENTIAL: Good  CLINICAL DECISION MAKING: Evolving/moderate complexity  EVALUATION COMPLEXITY: Moderate   PLAN:  PT FREQUENCY: 1x/week  PT DURATION: 6 weeks  PLANNED INTERVENTIONS: Therapeutic exercises, Therapeutic activity, Neuromuscular re-education, Balance training, Gait training, Patient/Family education, Self Care, Vestibular training, DME instructions, and Re-evaluation  PLAN FOR NEXT SESSION: check goals and plan for D/C? Update HEP as needed   progress working on balance on compliant surface, stepping strategy, reactive balance,  modified SLS activities, blaze pods, how is walking program going?, possible further trial of rollator for safety;  Drake Leach, PT, DPT 08/03/2023, 7:59 AM

## 2023-08-10 ENCOUNTER — Ambulatory Visit: Payer: Medicare Other | Admitting: Physical Therapy

## 2023-08-17 ENCOUNTER — Ambulatory Visit: Payer: Medicare Other | Admitting: Physical Therapy

## 2023-08-17 ENCOUNTER — Encounter: Payer: Self-pay | Admitting: Physical Therapy

## 2023-08-17 VITALS — BP 129/79 | HR 82

## 2023-08-17 DIAGNOSIS — M6281 Muscle weakness (generalized): Secondary | ICD-10-CM

## 2023-08-17 DIAGNOSIS — R262 Difficulty in walking, not elsewhere classified: Secondary | ICD-10-CM

## 2023-08-17 DIAGNOSIS — R2681 Unsteadiness on feet: Secondary | ICD-10-CM | POA: Diagnosis not present

## 2023-08-17 DIAGNOSIS — R42 Dizziness and giddiness: Secondary | ICD-10-CM

## 2023-08-17 NOTE — Therapy (Signed)
OUTPATIENT PHYSICAL THERAPY VESTIBULAR TREATMENT / DISCHARGE  PHYSICAL THERAPY DISCHARGE SUMMARY  Visits from Start of Care: 7  Current functional level related to goals / functional outcomes: See below   Remaining deficits: Falls risk   Education / Equipment: Use 2WW    Patient agrees to discharge. Patient goals were partially met. Patient is being discharged due to meeting the stated rehab goals.   Patient Name: James [name]. 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)    History   Portal vein thrombosis    Rhinitis    Situational depression    Sleep apnea    uses CPAP every night   Tubular adenoma 12/10/2015   6 cecum polyps   Past Surgical History:  Procedure Laterality Date   CARDIAC CATHETERIZATION     greater 10 yrs ago, normal   CARPAL TUNNEL RELEASE     bilateral   COLONOSCOPY  06/2005   [facility] [place]    ESOPHAGOGASTRODUODENOSCOPY (EGD) WITH PROPOFOL N/A 09/28/2016   Procedure: ESOPHAGOGASTRODUODENOSCOPY (EGD) WITH PROPOFOL;  Surgeon: Iva Boop, MD;  Location: [facility];  Service: Endoscopy;  Laterality: N/A;   IR GENERIC HISTORICAL  12/12/2016   IR US GUIDE VASC ACCESS RIGHT 12/12/2016 Jolaine Click, MD [facility]   IR GENERIC HISTORICAL  12/12/2016   IR VENOGRAM HEPATIC W HEMODYNAMIC EVALUATION 12/12/2016 Jolaine Click, MD [facility]   IR GENERIC HISTORICAL  12/12/2016   IR TRANSCATHETER BX 12/12/2016 Jolaine Click, MD [facility]   REVERSE SHOULDER ARTHROPLASTY Right 09/01/2021   Procedure: REVERSE SHOULDER ARTHROPLASTY;  Surgeon: Francena Hanly, MD;  Location: [facility];  Service: Orthopedics;  Laterality: Right;  ,   ROTATOR CUFF REPAIR     right   TENDON REPAIR     left arm   TONSILLECTOMY     TOTAL KNEE ARTHROPLASTY Right 2010   WISDOM TOOTH EXTRACTION     Patient Active Problem List   Diagnosis Date Noted   DDD (degenerative disc disease), cervical 01/30/2023   Pre-syncope 01/30/2023   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 01/30/2023   CPAP use counseling 01/30/2023   Acute metabolic encephalopathy 09/03/2021  Acute encephalopathy 09/02/2021   Type 2 diabetes mellitus with hyperglycemia (HCC) 09/02/2021   SIRS (systemic inflammatory response syndrome) (HCC) 09/02/2021   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   OSA on CPAP 10/30/2017   Hypogonadism, male 10/30/2017   Iron deficiency anemia due to chronic blood loss 11/20/2016   Erosive gastritis with hemorrhage    Melena    Acute blood loss anemia 09/27/2016   Acute upper GI bleed 09/27/2016   Abnormal liver diagnostic imaging 06/22/2016   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/03/2016   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   Portal vein thrombosis 04/29/2016   Diabetes mellitus (HCC) 04/29/2016   Leukocytosis 04/29/2016   Dyspnea  12/31/2015   Essential hypertension 12/31/2015   Hyperlipidemia 12/31/2015   Nocturia more than twice per night 11/11/2014   Severe obesity (BMI >= 40) (HCC) 11/11/2014   Hypersomnia with sleep apnea 11/11/2014   Polycythemia vera (HCC) 11/29/2011    PCP: [name], MD REFERRING PROVIDER: Olevia Perches, NP  REFERRING DIAG: R42 (ICD-10-CM) - Dizziness and giddiness  THERAPY DIAG:  Unsteadiness on feet  Difficulty in walking, not elsewhere classified  Muscle weakness (generalized)  Dizziness and giddiness  ONSET DATE: 06/18/2023 (referral date)  Rationale for Evaluation and Treatment: Rehabilitation  SUBJECTIVE:   SUBJECTIVE STATEMENT: Patient reports that he had one near falls since last here when bending down and lost his balance moving forward but no injuries and was able to catch himself. Arrives to session with 2WW. Patient otherwise doing well and ready for D/C.   Pt accompanied by: self  PERTINENT HISTORY: polycythemia vera (HCC) in remission, basilar arterial dolichoectasia, chronic right PCA infarct  PAIN:  Are you having pain? No  PRECAUTIONS: Fall  RED FLAGS: Denies changes in bowel bladder function.   WEIGHT BEARING RESTRICTIONS: No  FALLS: Has patient fallen in last 6 months? No - Reports too many close calls to count, at least one time a week  LIVING ENVIRONMENT: Lives with: lives with their spouse Lives in: House/apartment Stairs: Yes: Internal: 13 steps; on left going up and External: 3 steps; on left going up Has following equipment at home: Single point cane, Walker - 2 wheeled, and Wheelchair (manual)  PLOF: Independent   PATIENT GOALS: "To get better with whatever you tell me to do. I would like to work on my balance."   OBJECTIVE:   DIAGNOSTIC FINDINGS:   01/27/2023 MR Brain IMPRESSION: 1. No acute intracranial abnormality. 2. Pronounced chronic arterial dolichoectasia, especially the Basilar artery. 3. Chronic ischemic  disease, with mild progression of small vessel type changes since 2018. Stable chronic distal right PCA territory infarct. 4. Progressed cervical spine degeneration since 2018  COGNITION: Overall cognitive status: Within functional limits for tasks assessed (Patient says his wife would say he had some memory changes)   VESTIBULAR TREATMENT:                                                                                                    Vitals:   08/17/23 0811  BP:  129/79  Pulse: 82    TherAct:  Initiated session educating patient on proper walker height and provided new tennis balls as patient's on 2WW were worn out. Unable to adjust walker for patient as pegs jammed but patient has other walker at home to adjust.    M-CTSIB  Condition 1: Firm Surface, EO 30 Sec, Normal Sway  Condition 2: Firm Surface, EC 30 Sec, Normal Sway  Condition 3: Foam Surface, EO 30 Sec, Normal Sway  Condition 4: Foam Surface, EC 25 Sec, Mod Sway    [facility] PT Assessment - 08/17/23 0001       Standardized Balance Assessment   10 Meter Walk 0.85   m/s with 2WW (SBA)     Functional Gait  Assessment   Gait assessed  Yes    Gait Level Surface Walks 20 ft in less than 7 sec but greater than 5.5 sec, uses assistive device, slower speed, mild gait deviations, or deviates 6-10 in outside of the 12 in walkway width.    Change in Gait Speed Able to change speed, demonstrates mild gait deviations, deviates 6-10 in outside of the 12 in walkway width, or no gait deviations, unable to achieve a major change in velocity, or uses a change in velocity, or uses an assistive device.    Gait with Horizontal Head Turns Performs head turns smoothly with slight change in gait velocity (eg, minor disruption to smooth gait path), deviates 6-10 in outside 12 in walkway width, or uses an assistive device.    Gait with Vertical Head Turns Performs task with slight change in gait velocity (eg, minor disruption to smooth gait path),  deviates 6 - 10 in outside 12 in walkway width or uses assistive device    Gait and Pivot Turn Pivot turns safely within 3 sec and stops quickly with no loss of balance.    Step Over Obstacle Is able to step over one shoe box (4.5 in total height) without changing gait speed. No evidence of imbalance.    Gait with Narrow Base of Support Ambulates 4-7 steps.    Gait with Eyes Closed Cannot walk 20 ft without assistance, severe gait deviations or imbalance, deviates greater than 15 in outside 12 in walkway width or will not attempt task.    Ambulating Backwards Walks 20 ft, uses assistive device, slower speed, mild gait deviations, deviates 6-10 in outside 12 in walkway width.    Steps Alternating feet, must use rail.    Total Score 18    FGA comment: 18/30 = falls risk                PATIENT EDUCATION: Education details: D/C progress and when to return to therapy if notices decline Person educated: Patient Education method: Chief Technology Officer Education comprehension: verbalized understanding  HOME EXERCISE PROGRAM:  Access Code: [email or web address] URL: [email or web address] Date: 08/03/2023 Prepared by: Sherlie Ban  Exercises - Standing Romberg to 1/2 Tandem Stance  - 1 x daily - 7 x weekly - 3 sets - 30 seconds hold - Corner Balance Feet Together: Eyes Closed With Head Turns  - 1 x daily - 7 x weekly - 3 sets - 10 reps - Forward Step Touch  - 1 x daily - 7 x weekly - 1-2 sets - 10 reps - Standing Balance with Eyes Closed on Foam  - 1 x daily - 5 x weekly - 3 sets - 30 hold  Provided printout out [employer] program from [facility]  GOALS: Goals reviewed with patient? Yes  SHORT TERM GOALS: Target date: 07/20/2023  Patient will demonstrate 100% compliance with initial HEP to continue to progress between physical therapy sessions.   Baseline:  Reports good understanding of HEP and walker safety Goal status: MET  2.  Therapist  will assess and write LTG for FGA Baseline: Assessed on 07/03/2023 Goal status: MET  LONG TERM GOALS: Target date: 08/10/2023  Patient will report demonstrate independence with final HEP in order to maintain current gains and continue to progress after physical therapy discharge.   Baseline: Reports good understanding of HEP and walker safety Goal status: MET  2.  Patient will improve gait speed to 0.8 m/s to indicate improvement to the level of community ambulator in order to participate more easily in activities outside of the home.   Baseline: 0.69 m/s without AD; improved to 0.85 m/s with 2WW (SBA) Goal status: MET  3.  Patient will improve mCTSIB score to 120/120 to indicate improved integration of vestibular, proprioceptive, and visual balance systems during static balance tasks in order to reduce risk for falls.   Baseline: 100/120; improved to 115/120  Goal status: ADEQUATE PROGRESS  4.  Patient will improve FGA to greater than 22/30 to indicate a decreased risk of falls and improved dynamic stability.   Baseline: 17/30; improved to 18/30 Goal status: NOT MET  ASSESSMENT:  CLINICAL IMPRESSION: Pt is discharging from skilled physical therapy due to maximal rehab potential at this time. Patient demonstrates increased risk for falls with minimal progress on FGA so recommend use of 2WW. Improved integration noted on vestibular system with mCTSIB on condition 4 though still scores 5 points below normal. Cognition changes require frequent cueing and education on pacing and safety given impulsivity. Patient demonstrates independence in HEP and understanding of use of 2WW and is in agreement to D/C.   OBJECTIVE IMPAIRMENTS: Abnormal gait, decreased balance, decreased endurance, decreased mobility, difficulty walking, decreased ROM, and decreased strength.   ACTIVITY LIMITATIONS: carrying, bending, and reach over head  PARTICIPATION LIMITATIONS: laundry, community activity, yard work, and  getting dressed particularly putting on shoes  PERSONAL FACTORS: Age, Time since onset of injury/illness/exacerbation, and 3+ comorbidities: see above  are also affecting patient's functional outcome.   REHAB POTENTIAL: Good  CLINICAL DECISION MAKING: Evolving/moderate complexity  EVALUATION COMPLEXITY: Moderate   PLAN:  PT FREQUENCY: 1x/week  PT DURATION: 6 weeks  PLANNED INTERVENTIONS: Therapeutic exercises, Therapeutic activity, Neuromuscular re-education, Balance training, Gait training, Patient/Family education, Self Care, Vestibular training, DME instructions, and Re-evaluation  PLAN FOR NEXT SESSION: not indicated - d/c with updated HEP  progress working on balance on compliant surface, stepping strategy, reactive balance,  modified SLS activities, blaze pods, how is walking program going?, possible further trial of rollator for safety;  Carmelia Bake, PT, DPT 08/17/2023, 8:51 AM

## 2023-08-27 ENCOUNTER — Encounter: Payer: Self-pay | Admitting: *Deleted

## 2023-08-27 NOTE — Progress Notes (Unsigned)
PATIENT: James [name]. DOB: 04-Jan-1946  REASON FOR VISIT: follow up HISTORY FROM: patient PRIMARY NEUROLOGIST:   Virtual Visit via Video Note  I connected with James [name]. on 08/27/23 at  1:00 PM EDT by a video enabled telemedicine application located remotely at [facility] and verified that I am speaking with the correct person using two identifiers who was located at their own home.   I discussed the limitations of evaluation and management by telemedicine and the availability of in person appointments. The patient expressed understanding and agreed to proceed.   PATIENT: James [name]. DOB: 06/08/1946  REASON FOR VISIT: follow up HISTORY FROM: patient  HISTORY OF PRESENT ILLNESS: Today 08/27/23:  James [name]. is a 78 y.o. male with a history of OSA on CPAP. Returns today for follow-up.      HISTORY   REVIEW OF SYSTEMS: Out of a complete 14 system review of symptoms, the patient complains only of the following symptoms, and all other reviewed systems are negative.  ALLERGIES: Allergies  Allergen Reactions   Ibuprofen Anaphylaxis and Other (See Comments)    Upper GI Bled   Nsaids Other (See Comments)    GI Bleed    HOME MEDICATIONS: Outpatient Medications Prior to Visit  Medication Sig Dispense Refill   amLODipine (NORVASC) 5 MG tablet Take 5 mg by mouth daily.     amoxicillin (AMOXIL) 500 MG capsule 2,000 mg. Prior to dental work. (Patient not taking: Reported on 06/27/2023)     buPROPion (WELLBUTRIN XL) 300 MG 24 hr tablet Take 300 mg by mouth every morning.     dasatinib (SPRYCEL) 70 MG tablet Take 1 tablet (70 mg total) by mouth daily. 30 tablet 12   desmopressin (DDAVP) 0.2 MG tablet Take 400 mcg by mouth at bedtime.     Dulaglutide (TRULICITY) 1.5 MG/0.5ML SOPN once a week.     fluticasone (FLONASE) 50 MCG/ACT nasal spray Place 1 spray into both nostrils as needed for allergies. (Patient not taking: Reported on 06/27/2023)      glucose blood test strip OneTouch Ultra Blue Test Strip  USE TO TEST BLOOD SUGAR ONCE DAILY     hydrOXYzine (VISTARIL) 25 MG capsule Take 25 mg by mouth at bedtime as needed.     LORazepam (ATIVAN) 0.5 MG tablet Take 0.5 mg by mouth at bedtime.  3   losartan-hydrochlorothiazide (HYZAAR) 100-12.5 MG tablet Take 1 tablet by mouth daily.     meclizine (ANTIVERT) 25 MG tablet Take 25 mg by mouth daily as needed for dizziness. (Patient not taking: Reported on 06/27/2023)     metFORMIN (GLUCOPHAGE-XR) 500 MG 24 hr tablet Take 500 mg by mouth 2 (two) times daily.     moxifloxacin (VIGAMOX) 0.5 % ophthalmic solution Place 1 drop into the right eye See admin instructions. Place 1 drop in right eye QID the day before, day of, and day after treatment. (Patient not taking: Reported on 06/27/2023)  5   rosuvastatin (CRESTOR) 20 MG tablet Take 20 mg by mouth at bedtime.     sildenafil (VIAGRA) 50 MG tablet Take 50 mg by mouth daily as needed.     testosterone cypionate (DEPOTESTOSTERONE CYPIONATE) 200 MG/ML injection Inject 200 mg into the muscle every 21 ( twenty-one) days.     valsartan-hydrochlorothiazide (DIOVAN-HCT) 320-25 MG tablet Take 1 tablet by mouth daily.     vitamin B-12 (CYANOCOBALAMIN) 1000 MCG tablet Take 1,000 mcg by mouth daily.     Carlena Hurl  10 MG TABS tablet TAKE 1 TABLET BY MOUTH EVERY DAY 30 tablet 11   No facility-administered medications prior to visit.    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)    History   Portal vein thrombosis    Rhinitis    Situational depression    Sleep apnea    uses CPAP every night   Tubular adenoma 12/10/2015   6 cecum polyps    PAST SURGICAL HISTORY: Past Surgical History:  Procedure Laterality Date   CARDIAC CATHETERIZATION     greater 10 yrs ago, normal   CARPAL TUNNEL RELEASE     bilateral   COLONOSCOPY  06/2005   [facility]   ESOPHAGOGASTRODUODENOSCOPY (EGD) WITH PROPOFOL N/A 09/28/2016   Procedure: ESOPHAGOGASTRODUODENOSCOPY (EGD) WITH PROPOFOL;  Surgeon: Iva Boop, MD;  Location: [facility];  Service: Endoscopy;  Laterality: N/A;   IR GENERIC HISTORICAL  12/12/2016   IR US GUIDE VASC ACCESS RIGHT 12/12/2016 Jolaine Click, MD [facility]   IR GENERIC HISTORICAL  12/12/2016   IR VENOGRAM HEPATIC W HEMODYNAMIC EVALUATION 12/12/2016 Jolaine Click, MD [facility]   IR GENERIC HISTORICAL  12/12/2016   IR TRANSCATHETER BX 12/12/2016 Jolaine Click, MD [facility]   REVERSE SHOULDER ARTHROPLASTY Right 09/01/2021   Procedure: REVERSE SHOULDER ARTHROPLASTY;  Surgeon: Francena Hanly, MD;  Location: [facility];  Service: Orthopedics;  Laterality: Right;  ,   ROTATOR CUFF REPAIR     right   TENDON REPAIR     left arm   TONSILLECTOMY     TOTAL KNEE ARTHROPLASTY Right 2010   WISDOM TOOTH EXTRACTION      FAMILY HISTORY: Family History  Problem Relation Age of Onset   Diabetes Mother        history   Stroke Mother        history   Lung disease Father        history   Bipolar disorder Son        committed suicide   Colon cancer Other    Heart failure Other    Heart attack Other    Colon polyps Neg Hx    Rectal cancer Neg Hx    Stomach cancer Neg Hx    Esophageal cancer Neg Hx    Sleep apnea Neg Hx     SOCIAL HISTORY: Social History   Socioeconomic History   Marital status: Married    Spouse name: Not on file   Number of children: 2    Years of education: Not on file   Highest education level: Not on file  Occupational History   Occupation: retired  Tobacco Use   Smoking status: Former    Types: Pipe    Quit date: 1975    Years since quitting: 49.7   Smokeless tobacco:  Never  Vaping Use   Vaping status: Never Used  Substance and Sexual Activity   Alcohol use: Yes    Alcohol/week: 0.0 standard drinks of alcohol   Drug use: No   Sexual activity: Not on file  Other Topics Concern   Not on file  Social History Narrative   Right handed.  Caffeine 3 cups daily,  Married 2 kids (one deceased).  College grad.  [employer].    Social Determinants of Health   Financial Resource Strain: Not on file  Food Insecurity: Not on file  Transportation Needs: Not on file  Physical Activity: Not on file  Stress: Not on file  Social Connections: Unknown (05/02/2022)   Received from [facility]   Social Network    Social Network: Not on file  Intimate Partner Violence: Unknown (03/24/2022)   Received from [facility]   HITS    Physically Hurt: Not on file    Insult or Talk Down To: Not on file    Threaten Physical Harm: Not on file    Scream or Curse: Not on file      PHYSICAL EXAM Generalized: Well developed, in no acute distress   Neurological examination  Mentation: Alert oriented to time, place, history taking. Follows all commands speech and language fluent Cranial nerve II-XII:Extraocular movements were full. Facial symmetry noted. uvula tongue midline. Head turning and shoulder shrug  were normal and symmetric. Motor: Good strength throughout subjectively per patient Sensory: Sensory testing is intact to soft touch on all 4 extremities subjectively per patient Coordination: Cerebellar testing reveals good finger-nose-finger  Gait and station: Patient is able to stand from a seated position. gait is normal.  Reflexes: UTA  DIAGNOSTIC DATA (LABS, IMAGING,  TESTING) - I reviewed patient records, labs, notes, testing and imaging myself where available.  Lab Results  Component Value Date   WBC 6.2 06/27/2023   HGB 15.0 06/27/2023   HCT 49.5 06/27/2023   MCV 87.5 06/27/2023   PLT 242 06/27/2023      Component Value Date/Time   NA 137 06/27/2023 1001   NA 137 10/29/2017 1307   NA 134 (L) 05/10/2017 0755   K 4.7 06/27/2023 1001   K 3.6 10/29/2017 1307   K 4.2 05/10/2017 0755   CL 103 06/27/2023 1001   CL 101 10/29/2017 1307   CO2 28 06/27/2023 1001   CO2 25 10/29/2017 1307   CO2 20 (L) 05/10/2017 0755   GLUCOSE 162 (H) 06/27/2023 1001   GLUCOSE 200 (H) 10/29/2017 1307   BUN 16 06/27/2023 1001   BUN 14 10/29/2017 1307   BUN 21.0 05/10/2017 0755   CREATININE 1.55 (H) 06/27/2023 1001   CREATININE 1.4 (H) 10/29/2017 1307   CREATININE 1.3 05/10/2017 0755   CALCIUM 10.8 (H) 06/27/2023 1001   CALCIUM 10.3 10/29/2017 1307   CALCIUM 10.3 05/10/2017 0755   PROT 7.1 06/27/2023 1001   PROT 6.5 10/29/2017 1307   PROT 6.9 05/10/2017 0755   ALBUMIN 4.4 06/27/2023 1001   ALBUMIN 3.6 10/29/2017 1307   ALBUMIN 3.9 05/10/2017 0755   AST 19 06/27/2023 1001   AST 25 05/10/2017 0755   ALT 13 06/27/2023 1001   ALT 27 10/29/2017 1307   ALT 26 05/10/2017 0755   ALKPHOS 84 06/27/2023 1001   ALKPHOS 77 10/29/2017 1307   ALKPHOS 97 05/10/2017 0755   BILITOT 0.7 06/27/2023 1001   BILITOT 0.70 05/10/2017 0755   GFRNONAA 46 (L) 06/27/2023 1001  GFRAA 53 (L) 09/02/2020 0900   No results found for: "CHOL", "HDL", "LDLCALC", "LDLDIRECT", "TRIG", "CHOLHDL" Lab Results  Component Value Date   HGBA1C 6.0 (H) 08/24/2021   Lab Results  Component Value Date   VITAMINB12 949 (H) 09/02/2021   No results found for: "TSH"    ASSESSMENT AND PLAN 77 y.o. year old male  has a past medical history of Anxiety, Arthritis, Clotting disorder (HCC), CML (chronic myelocytic leukemia) (HCC) (12/16/2020), Depression, DM (diabetes mellitus) (HCC), GERD  (gastroesophageal reflux disease), Hiatal hernia, History of shingles (04/03/2014), HTN (hypertension), Hyperlipidemia, Insomnia, Iron deficiency anemia due to chronic blood loss (11/20/2016), Macular degeneration, Nocturia more than twice per night (11/11/2014), Obesity, Polycythemia vera(238.4), Portal vein thrombosis, Rhinitis, Situational depression, Sleep apnea, and Tubular adenoma (12/10/2015). here with:  OSA on CPAP  CPAP compliance excellent Residual AHI is good Encouraged patient to continue using CPAP nightly and > 4 hours each night F/U in 1 year or sooner if needed  I spent 20 minutes of face-to-face and non-face-to-face time with patient.  This included previsit chart review, lab review, study review, order entry, electronic health record documentation, patient education.  Butch Penny, MSN, NP-C 08/27/2023, 12:15 PM [facility] [address] [place], [place] [address] [phone]

## 2023-08-28 ENCOUNTER — Encounter: Payer: Self-pay | Admitting: Adult Health

## 2023-08-28 ENCOUNTER — Ambulatory Visit (INDEPENDENT_AMBULATORY_CARE_PROVIDER_SITE_OTHER): Payer: Medicare Other | Admitting: Adult Health

## 2023-08-28 VITALS — BP 143/89 | HR 79 | Ht 66.0 in | Wt 177.0 lb

## 2023-08-28 DIAGNOSIS — G4733 Obstructive sleep apnea (adult) (pediatric): Secondary | ICD-10-CM | POA: Diagnosis not present

## 2023-09-21 ENCOUNTER — Other Ambulatory Visit: Payer: Self-pay | Admitting: Hematology & Oncology

## 2023-09-25 ENCOUNTER — Inpatient Hospital Stay (HOSPITAL_BASED_OUTPATIENT_CLINIC_OR_DEPARTMENT_OTHER): Payer: Medicare Other | Admitting: Hematology & Oncology

## 2023-09-25 ENCOUNTER — Inpatient Hospital Stay: Payer: Medicare Other | Attending: Hematology & Oncology

## 2023-09-25 ENCOUNTER — Encounter: Payer: Self-pay | Admitting: Hematology & Oncology

## 2023-09-25 ENCOUNTER — Other Ambulatory Visit: Payer: Self-pay

## 2023-09-25 ENCOUNTER — Inpatient Hospital Stay: Payer: Medicare Other

## 2023-09-25 VITALS — BP 149/78 | HR 67 | Temp 98.2°F | Resp 18 | Ht 66.0 in | Wt 179.0 lb

## 2023-09-25 DIAGNOSIS — C9210 Chronic myeloid leukemia, BCR/ABL-positive, not having achieved remission: Secondary | ICD-10-CM | POA: Diagnosis not present

## 2023-09-25 DIAGNOSIS — C9211 Chronic myeloid leukemia, BCR/ABL-positive, in remission: Secondary | ICD-10-CM | POA: Diagnosis present

## 2023-09-25 DIAGNOSIS — D45 Polycythemia vera: Secondary | ICD-10-CM

## 2023-09-25 DIAGNOSIS — D500 Iron deficiency anemia secondary to blood loss (chronic): Secondary | ICD-10-CM

## 2023-09-25 LAB — IRON AND IRON BINDING CAPACITY (CC-WL,HP ONLY)
Iron: 48 ug/dL (ref 45–182)
Saturation Ratios: 12 % — ABNORMAL LOW (ref 17.9–39.5)
TIBC: 419 ug/dL (ref 250–450)
UIBC: 371 ug/dL (ref 117–376)

## 2023-09-25 LAB — CBC WITH DIFFERENTIAL (CANCER CENTER ONLY)
Abs Immature Granulocytes: 0.01 10*3/uL (ref 0.00–0.07)
Basophils Absolute: 0.1 10*3/uL (ref 0.0–0.1)
Basophils Relative: 1 %
Eosinophils Absolute: 0.4 10*3/uL (ref 0.0–0.5)
Eosinophils Relative: 6 %
HCT: 46.7 % (ref 39.0–52.0)
Hemoglobin: 14.7 g/dL (ref 13.0–17.0)
Immature Granulocytes: 0 %
Lymphocytes Relative: 17 %
Lymphs Abs: 1 10*3/uL (ref 0.7–4.0)
MCH: 27.5 pg (ref 26.0–34.0)
MCHC: 31.5 g/dL (ref 30.0–36.0)
MCV: 87.3 fL (ref 80.0–100.0)
Monocytes Absolute: 0.9 10*3/uL (ref 0.1–1.0)
Monocytes Relative: 15 %
Neutro Abs: 3.5 10*3/uL (ref 1.7–7.7)
Neutrophils Relative %: 61 %
Platelet Count: 227 10*3/uL (ref 150–400)
RBC: 5.35 MIL/uL (ref 4.22–5.81)
RDW: 18.9 % — ABNORMAL HIGH (ref 11.5–15.5)
WBC Count: 5.8 10*3/uL (ref 4.0–10.5)
nRBC: 0 % (ref 0.0–0.2)

## 2023-09-25 LAB — CMP (CANCER CENTER ONLY)
ALT: 11 U/L (ref 0–44)
AST: 18 U/L (ref 15–41)
Albumin: 4 g/dL (ref 3.5–5.0)
Alkaline Phosphatase: 76 U/L (ref 38–126)
Anion gap: 8 (ref 5–15)
BUN: 16 mg/dL (ref 8–23)
CO2: 26 mmol/L (ref 22–32)
Calcium: 10.8 mg/dL — ABNORMAL HIGH (ref 8.9–10.3)
Chloride: 106 mmol/L (ref 98–111)
Creatinine: 1.47 mg/dL — ABNORMAL HIGH (ref 0.61–1.24)
GFR, Estimated: 49 mL/min — ABNORMAL LOW (ref >60)
Glucose, Bld: 148 mg/dL — ABNORMAL HIGH (ref 70–99)
Potassium: 4.2 mmol/L (ref 3.5–5.1)
Sodium: 140 mmol/L (ref 135–145)
Total Bilirubin: 0.6 mg/dL (ref 0.3–1.2)
Total Protein: 6.8 g/dL (ref 6.5–8.1)

## 2023-09-25 LAB — SAVE SMEAR(SSMR), FOR PROVIDER SLIDE REVIEW

## 2023-09-25 LAB — LACTATE DEHYDROGENASE: LDH: 151 U/L (ref 98–192)

## 2023-09-25 LAB — FERRITIN: Ferritin: 9 ng/mL — ABNORMAL LOW (ref 24–336)

## 2023-09-25 NOTE — Progress Notes (Signed)
Hematology and Oncology Follow Up Visit  Yunis Elg [id number] 03-May-1946 77 y.o. 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 IV iron as indicated - last received in March 2022        Interim History:  Mr. Bator is here today for follow-up.  Overall, he is doing quite nicely.  We last saw him back in July.  Since then, he has been fairly busy.  With regard to his health, he has had no complications.  He is on Sprycel for his CML.  His last BCR/ABL which did not show any detected mutation.  As such, he is in a  MMR.  He has had no fever.  He has had no problems with bleeding.  He is on Xarelto.  He does have polycythemia.  His hematocrit today is 46%.  I think we can hold on doing a phlebotomy.  He has had no change in bowel or bladder habits.  He has had no leg swelling.  He has had no rashes.  There has been no cough or chest wall pain.  Overall, I would say performance status is probably ECOG 1.    Medications:  Allergies as of 09/25/2023       Reactions   Ibuprofen Anaphylaxis, Other (See Comments)   Upper GI Bled   Nsaids Other (See Comments)   GI Bleed        Medication List        Accurate as of September 25, 2023 10:14 AM. If you have any questions, ask your nurse or doctor.          amLODipine 5 MG tablet Commonly known as: NORVASC Take 5 mg by mouth daily.   buPROPion 300 MG 24 hr tablet Commonly known as: WELLBUTRIN XL Take 300 mg by mouth every morning.   cyanocobalamin 1000 MCG tablet Commonly known as: VITAMIN B12 Take 1,000 mcg by mouth daily.   dasatinib 70 MG tablet Commonly known as: Sprycel Take 1 tablet (70 mg total) by mouth daily.   desmopressin 0.2 MG tablet Commonly known as: DDAVP Take 400  mcg by mouth at bedtime.   fluticasone 50 MCG/ACT nasal spray Commonly known as: FLONASE Place 1 spray into both nostrils as needed for allergies.   glucose blood test strip OneTouch Ultra Blue Test Strip  USE TO TEST BLOOD SUGAR ONCE DAILY   hydrOXYzine 25 MG capsule Commonly known as: VISTARIL Take 25 mg by mouth at bedtime as needed.   LORazepam 0.5 MG tablet Commonly known as: ATIVAN Take 0.5 mg by mouth at bedtime.   losartan-hydrochlorothiazide 100-12.5 MG tablet Commonly known as: HYZAAR Take 1 tablet by mouth daily.   metFORMIN 500 MG 24 hr tablet Commonly known as: GLUCOPHAGE-XR Take 500 mg by mouth 2 (two) times daily.   moxifloxacin 0.5 % ophthalmic solution Commonly known as: VIGAMOX Place 1 drop into the right eye See admin instructions. Place 1 drop in right eye QID the day before, day of, and day after treatment.   rosuvastatin 20 MG tablet Commonly known as: CRESTOR Take 20 mg by mouth at bedtime.   sildenafil 50 MG tablet Commonly known as: VIAGRA Take 50 mg by mouth daily as needed.   testosterone cypionate 200 MG/ML injection Commonly  known as: DEPOTESTOSTERONE CYPIONATE Inject 200 mg into the muscle every 21 ( twenty-one) days.   Trulicity 1.5 MG/0.5ML Sopn Generic drug: Dulaglutide once a week.   valsartan-hydrochlorothiazide 320-25 MG tablet Commonly known as: DIOVAN-HCT Take 1 tablet by mouth daily.   Xarelto 10 MG Tabs tablet Generic drug: rivaroxaban TAKE 1 TABLET BY MOUTH EVERY DAY        Allergies:  Allergies  Allergen Reactions   Ibuprofen Anaphylaxis and Other (See Comments)    Upper GI Bled   Nsaids Other (See Comments)    GI Bleed    Past Medical History, Surgical history, Social history, and Family History were reviewed and updated.  Review of Systems: Review of Systems  Constitutional: Negative.   HENT: Negative.    Eyes: Negative.   Respiratory: Negative.    Cardiovascular: Negative.   Gastrointestinal:  Negative.   Genitourinary: Negative.   Musculoskeletal: Negative.   Skin: Negative.   Neurological: Negative.   Endo/Heme/Allergies: Negative.   Psychiatric/Behavioral: Negative.      Physical Exam:  height is 5\' 6"  (1.676 m) and weight is 179 lb (81.2 kg). His oral temperature is 98.2 F (36.8 C). His blood pressure is 149/78 (abnormal) and his pulse is 67. His respiration is 18 and oxygen saturation is 96%.   Wt Readings from Last 3 Encounters:  09/25/23 179 lb (81.2 kg)  08/28/23 177 lb (80.3 kg)  06/27/23 185 lb (83.9 kg)    Physical Exam Vitals reviewed.  HENT:     Head: Normocephalic and atraumatic.  Eyes:     Pupils: Pupils are equal, round, and reactive to light.  Cardiovascular:     Rate and Rhythm: Normal rate and regular rhythm.     Heart sounds: Normal heart sounds.  Pulmonary:     Effort: Pulmonary effort is normal.     Breath sounds: Normal breath sounds.  Abdominal:     General: Bowel sounds are normal.     Palpations: Abdomen is soft.  Musculoskeletal:        General: No tenderness or deformity. Normal range of motion.     Cervical back: Normal range of motion.  Lymphadenopathy:     Cervical: No cervical adenopathy.  Skin:    General: Skin is warm and dry.     Findings: No erythema or rash.  Neurological:     Mental Status: He is alert and oriented to person, place, and time.  Psychiatric:        Behavior: Behavior normal.        Thought Content: Thought content normal.        Judgment: Judgment normal.     Lab Results  Component Value Date   WBC 5.8 09/25/2023   HGB 14.7 09/25/2023   HCT 46.7 09/25/2023   MCV 87.3 09/25/2023   PLT 227 09/25/2023   Lab Results  Component Value Date   FERRITIN 15 (L) 06/27/2023   IRON 41 (L) 06/27/2023   TIBC 423 06/27/2023   UIBC 382 (H) 06/27/2023   IRONPCTSAT 10 (L) 06/27/2023   Lab Results  Component Value Date   RETICCTPCT 2.7 12/27/2022   RBC 5.35 09/25/2023   RETICCTABS 94.0 08/30/2011    No results found for: "KPAFRELGTCHN", "LAMBDASER", "KAPLAMBRATIO" Lab Results  Component Value Date   IGGSERUM 828 10/11/2016   IGA 177 06/22/2016   No results found for: "TOTALPROTELP", "ALBUMINELP", "A1GS", "A2GS", "BETS", "BETA2SER", "GAMS", "MSPIKE", "SPEI"   Chemistry      Component Value Date/Time  NA 140 09/25/2023 0924   NA 137 10/29/2017 1307   NA 134 (L) 05/10/2017 0755   K 4.2 09/25/2023 0924   K 3.6 10/29/2017 1307   K 4.2 05/10/2017 0755   CL 106 09/25/2023 0924   CL 101 10/29/2017 1307   CO2 26 09/25/2023 0924   CO2 25 10/29/2017 1307   CO2 20 (L) 05/10/2017 0755   BUN 16 09/25/2023 0924   BUN 14 10/29/2017 1307   BUN 21.0 05/10/2017 0755   CREATININE 1.47 (H) 09/25/2023 0924   CREATININE 1.4 (H) 10/29/2017 1307   CREATININE 1.3 05/10/2017 0755      Component Value Date/Time   CALCIUM 10.8 (H) 09/25/2023 0924   CALCIUM 10.3 10/29/2017 1307   CALCIUM 10.3 05/10/2017 0755   ALKPHOS 76 09/25/2023 0924   ALKPHOS 77 10/29/2017 1307   ALKPHOS 97 05/10/2017 0755   AST 18 09/25/2023 0924   AST 25 05/10/2017 0755   ALT 11 09/25/2023 0924   ALT 27 10/29/2017 1307   ALT 26 05/10/2017 0755   BILITOT 0.6 09/25/2023 0924   BILITOT 0.70 05/10/2017 0755     Impression and Plan: Mr. Mcnell is 77 year old gentleman with a long history of polycythemia. He is JAK2 negative.  He has transformed over to [facility].  He is on Sprycel.  He is responding quite nicely.  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  I am so happy that he is doing well.  Again we do not have to phlebotomize him.  He is in remission from the [facility].  We will continue him on the Sprycel.  I would think though at some point, we can probably take him off Sprycel.  He has been on this now for at least 2 years.  I would like to try to get him through the Holiday season now.  Will see him back in January.    Josph Macho, MD 10/8/202410:14 AM

## 2023-10-02 ENCOUNTER — Encounter: Payer: Self-pay | Admitting: *Deleted

## 2023-10-02 LAB — BCR/ABL

## 2023-11-05 ENCOUNTER — Other Ambulatory Visit (HOSPITAL_COMMUNITY): Payer: Self-pay

## 2023-11-07 ENCOUNTER — Ambulatory Visit (INDEPENDENT_AMBULATORY_CARE_PROVIDER_SITE_OTHER): Payer: Medicare Other | Admitting: Adult Health

## 2023-11-07 ENCOUNTER — Encounter: Payer: Self-pay | Admitting: Adult Health

## 2023-11-07 VITALS — BP 146/80 | HR 75 | Ht 67.0 in | Wt 174.0 lb

## 2023-11-07 DIAGNOSIS — G4733 Obstructive sleep apnea (adult) (pediatric): Secondary | ICD-10-CM

## 2023-11-07 NOTE — Progress Notes (Signed)
PATIENT: James [name]. DOB: 21-Jun-1946  REASON FOR VISIT: follow up HISTORY FROM: patient  Chief Complaint  Patient presents with   Follow-up    Pt in 19  Pt here for cpap  f/u Pt states no questions or concerns for todays  visit      HISTORY OF PRESENT ILLNESS: Today 11/07/23:  James [name]. is a 77 y.o. male with a history of OSA on CPAP. Returns today for follow-up.  Reports that CPAP is working well.  He denies any new issues.  His download is below              08/28/23: James [name]. is a 77 y.o. male with a history of OSA on CPAP. Returns today for follow-up. Overall doing well. DL is below.        REVIEW OF SYSTEMS: Out of a complete 14 system review of symptoms, the patient complains only of the following symptoms, and all other reviewed systems are negative.  ALLERGIES: Allergies  Allergen Reactions   Ibuprofen Anaphylaxis and Other (See Comments)    Upper GI Bled   Nsaids Other (See Comments)    GI Bleed    HOME MEDICATIONS: Outpatient Medications Prior to Visit  Medication Sig Dispense Refill   amLODipine (NORVASC) 5 MG tablet Take 5 mg by mouth daily.     buPROPion (WELLBUTRIN XL) 300 MG 24 hr tablet Take 300 mg by mouth every morning.     dasatinib (SPRYCEL) 70 MG tablet Take 1 tablet (70 mg total) by mouth daily. 30 tablet 12   desmopressin (DDAVP) 0.2 MG tablet Take 400 mcg by mouth at bedtime.     Dulaglutide (TRULICITY) 1.5 MG/0.5ML SOPN once a week.     fluticasone (FLONASE) 50 MCG/ACT nasal spray Place 1 spray into both nostrils as needed for allergies.     glucose blood test strip OneTouch Ultra Blue Test Strip  USE TO TEST BLOOD SUGAR ONCE DAILY     hydrOXYzine (VISTARIL) 25 MG capsule Take 25 mg by mouth at bedtime as needed.     LORazepam (ATIVAN) 0.5 MG tablet Take 0.5 mg by mouth at bedtime.  3   losartan-hydrochlorothiazide (HYZAAR) 100-12.5 MG tablet Take 1 tablet by mouth daily.     metFORMIN (GLUCOPHAGE-XR) 500  MG 24 hr tablet Take 500 mg by mouth 2 (two) times daily.     moxifloxacin (VIGAMOX) 0.5 % ophthalmic solution Place 1 drop into the right eye See admin instructions. Place 1 drop in right eye QID the day before, day of, and day after treatment.  5   rosuvastatin (CRESTOR) 20 MG tablet Take 20 mg by mouth at bedtime.     sildenafil (VIAGRA) 50 MG tablet Take 50 mg by mouth daily as needed.     testosterone cypionate (DEPOTESTOSTERONE CYPIONATE) 200 MG/ML injection Inject 200 mg into the muscle every 21 ( twenty-one) days.     valsartan-hydrochlorothiazide (DIOVAN-HCT) 320-25 MG tablet Take 1 tablet by mouth daily.     vitamin B-12 (CYANOCOBALAMIN) 1000 MCG tablet Take 1,000 mcg by mouth daily.     XARELTO 10 MG TABS tablet TAKE 1 TABLET BY MOUTH EVERY DAY 30 tablet 11   No facility-administered medications prior to visit.    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)    History   Portal vein thrombosis    Rhinitis    Situational depression    Sleep apnea    uses CPAP every night   Tubular adenoma 12/10/2015   6 cecum polyps    PAST SURGICAL HISTORY: Past Surgical History:  Procedure Laterality Date   CARDIAC CATHETERIZATION     greater 10 yrs ago, normal   CARPAL TUNNEL RELEASE     bilateral   COLONOSCOPY  06/2005   [facility] [place]   ESOPHAGOGASTRODUODENOSCOPY (EGD) WITH PROPOFOL N/A 09/28/2016   Procedure:  ESOPHAGOGASTRODUODENOSCOPY (EGD) WITH PROPOFOL;  Surgeon: Iva Boop, MD;  Location: [facility];  Service: Endoscopy;  Laterality: N/A;   IR GENERIC HISTORICAL  12/12/2016   IR US GUIDE VASC ACCESS RIGHT 12/12/2016 Jolaine Click, MD [facility]   IR GENERIC HISTORICAL  12/12/2016   IR VENOGRAM HEPATIC W HEMODYNAMIC EVALUATION 12/12/2016 Jolaine Click, MD [facility]   IR GENERIC HISTORICAL  12/12/2016   IR TRANSCATHETER BX 12/12/2016 Jolaine Click, MD [facility]   REVERSE SHOULDER ARTHROPLASTY Right 09/01/2021   Procedure: REVERSE SHOULDER ARTHROPLASTY;  Surgeon: Francena Hanly, MD;  Location: [facility];  Service: Orthopedics;  Laterality: Right;  ,   ROTATOR CUFF REPAIR     right   TENDON REPAIR     left arm   TONSILLECTOMY     TOTAL KNEE ARTHROPLASTY Right 2010   WISDOM TOOTH EXTRACTION      FAMILY HISTORY: Family History  Problem Relation Age of Onset   Diabetes Mother        history   Stroke Mother        history   Lung disease Father        history   Bipolar disorder Son        committed suicide   Colon cancer Other    Heart failure Other    Heart attack Other    Colon polyps Neg Hx    Rectal cancer Neg Hx    Stomach cancer Neg Hx    Esophageal cancer Neg Hx    Sleep apnea Neg Hx     SOCIAL HISTORY: Social History   Socioeconomic History   Marital status: Married    Spouse name: Not on file   Number of children: 2   Years of education: Not on file   Highest education level: Not on file  Occupational History   Occupation: retired  Tobacco Use   Smoking status: Former    Types: Pipe    Quit date: 1975    Years since quitting: 49.9   Smokeless tobacco: Never  Vaping Use   Vaping status: Never Used  Substance and Sexual Activity   Alcohol use: Yes    Alcohol/week: 0.0 standard drinks of alcohol   Drug use: No   Sexual activity: Not on file  Other Topics Concern   Not on file  Social History Narrative   Right handed.  Caffeine 3 cups daily,  Married 2 kids (one deceased).  College grad.  [employer].    Social Determinants of Health   Financial Resource Strain: Not on file  Food Insecurity: Not on file  Transportation Needs: Not on file  Physical Activity: Not on file  Stress: Not on file  Social Connections: Unknown (05/02/2022)   [facility]   Social Network    Social Network: Not on file  Intimate Partner Violence: Unknown (03/24/2022)   Received from [facility]   HITS    Physically Hurt: Not on file    Insult or Talk Down To: Not on file    Threaten Physical Harm: Not on file    Scream or Curse: Not on file      PHYSICAL EXAM Generalized: Well developed, in no acute distress   Neurological examination  Mentation: Alert oriented to time, place, history taking. Follows all commands speech and language fluent Cranial nerve II-XII:Facial symmetry noted.  DIAGNOSTIC DATA (LABS, IMAGING, TESTING) - I reviewed patient records, labs, notes, testing and imaging myself where available.  Lab Results  Component Value Date   WBC 5.8 09/25/2023   HGB 14.7 09/25/2023   HCT 46.7 09/25/2023   MCV 87.3 09/25/2023   PLT 227 09/25/2023      Component Value Date/Time   NA 140 09/25/2023 0924   NA 137 10/29/2017 1307   NA 134 (L) 05/10/2017 0755   K 4.2 09/25/2023 0924   K 3.6 10/29/2017 1307   K 4.2 05/10/2017 0755   CL 106 09/25/2023 0924   CL 101 10/29/2017 1307   CO2 26 09/25/2023 0924   CO2 25 10/29/2017 1307   CO2 20 (L) 05/10/2017 0755   GLUCOSE 148 (H) 09/25/2023 0924   GLUCOSE 200 (H) 10/29/2017 1307   BUN 16 09/25/2023 0924   BUN 14 10/29/2017 1307   BUN 21.0 05/10/2017 0755   CREATININE 1.47 (H) 09/25/2023 0924   CREATININE 1.4 (H) 10/29/2017 1307   CREATININE 1.3 05/10/2017 0755   CALCIUM 10.8 (H) 09/25/2023 0924   CALCIUM 10.3 10/29/2017 1307   CALCIUM 10.3 05/10/2017 0755   PROT 6.8 09/25/2023 0924   PROT 6.5 10/29/2017  1307   PROT 6.9 05/10/2017 0755   ALBUMIN 4.0 09/25/2023 0924   ALBUMIN 3.6 10/29/2017 1307   ALBUMIN 3.9 05/10/2017 0755   AST 18 09/25/2023 0924   AST 25 05/10/2017 0755   ALT 11 09/25/2023 0924   ALT 27 10/29/2017 1307   ALT 26 05/10/2017 0755   ALKPHOS 76 09/25/2023 0924   ALKPHOS 77 10/29/2017 1307   ALKPHOS 97 05/10/2017 0755   BILITOT 0.6 09/25/2023 0924   BILITOT 0.70 05/10/2017 0755   GFRNONAA 49 (L) 09/25/2023 0924   GFRAA 53 (L) 09/02/2020 0900       ASSESSMENT AND PLAN 77 y.o. year old male  has a past medical history of Anxiety, Arthritis, Clotting disorder (HCC), CML (chronic myelocytic leukemia) (HCC) (12/16/2020), Depression, DM (diabetes mellitus) (HCC), GERD (gastroesophageal reflux disease), Hiatal hernia, History of shingles (04/03/2014), HTN (hypertension), Hyperlipidemia, Insomnia, Iron deficiency anemia due to chronic blood loss (11/20/2016), Macular degeneration, Nocturia more than twice per night (11/11/2014), Obesity, Polycythemia vera(238.4), Portal vein thrombosis, Rhinitis, Situational depression, Sleep apnea, and Tubular adenoma (12/10/2015). here with:  OSA on CPAP  CPAP compliance excellent Residual AHI is good Encouraged patient to continue using CPAP nightly and > 4 hours each night F/U in 1 year or sooner if needed  Butch Penny, MSN, NP-C 11/07/2023, 1:59 PM [facility] [address] [place], [place] [address] [phone]

## 2023-11-07 NOTE — Patient Instructions (Signed)
Continue using CPAP nightly and greater than 4 hours each night °If your symptoms worsen or you develop new symptoms please let us know.  ° °

## 2023-11-12 ENCOUNTER — Encounter: Payer: Self-pay | Admitting: Gastroenterology

## 2023-11-21 ENCOUNTER — Ambulatory Visit (INDEPENDENT_AMBULATORY_CARE_PROVIDER_SITE_OTHER): Payer: Medicare Other | Admitting: Family Medicine

## 2023-11-21 ENCOUNTER — Ambulatory Visit (HOSPITAL_BASED_OUTPATIENT_CLINIC_OR_DEPARTMENT_OTHER)
Admission: RE | Admit: 2023-11-21 | Discharge: 2023-11-21 | Disposition: A | Discharge status: Home / Self Care | Payer: Medicare Other | Source: Ambulatory Visit | Attending: Family Medicine | Admitting: Family Medicine

## 2023-11-21 VITALS — BP 134/75 | Ht 66.0 in | Wt 174.0 lb

## 2023-11-21 DIAGNOSIS — M25552 Pain in left hip: Secondary | ICD-10-CM

## 2023-11-21 NOTE — Patient Instructions (Signed)
Get x-rays after you leave today. Assuming these are negative you bruised your hip at the greater trochanter. Icing 15 minutes at a time at least 3-4 times a day. Tylenol 500mg  1-2 tabs three times a day as needed for pain. Continue using the walker. Follow up with me in 2-3 weeks or as needed if you're doing well.

## 2023-11-23 ENCOUNTER — Encounter: Payer: Self-pay | Admitting: Family Medicine

## 2023-11-23 NOTE — Progress Notes (Signed)
PCP: [name], MD  Subjective:   HPI: Patient is a 77 y.o. male here for left hip pain.  Patient reports yesterday he accidentally fell onto his left side striking the floor with his left hip. No prodromal symptoms. Also struck the stairs. No loss of consciousness. Has been taking tylenol and icing. Some shoulder discomfort on the left too but this is mild - he is having problems with the right side separate from this fall. He takes xarelto. Able to ambulate with a walker.  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)    History   Portal vein thrombosis    Rhinitis    Situational depression    Sleep apnea    uses CPAP every night   Tubular adenoma 12/10/2015   6 cecum polyps    Current Outpatient Medications on File Prior to Visit  Medication Sig Dispense Refill   amLODipine (NORVASC) 5 MG tablet Take 5 mg by mouth daily. (Patient not taking: Reported on 11/07/2023)     buPROPion (WELLBUTRIN XL) 300 MG 24 hr tablet Take 300 mg by mouth every morning.     dasatinib (SPRYCEL) 70 MG tablet Take 1 tablet (70 mg total) by mouth daily. 30 tablet 12   desmopressin (DDAVP) 0.2 MG tablet Take 400 mcg by mouth at bedtime.     Dulaglutide (TRULICITY) 1.5 MG/0.5ML SOPN once a week. (Patient not taking: Reported on 11/07/2023)     fluticasone (FLONASE) 50 MCG/ACT nasal spray Place 1 spray into both nostrils as needed for allergies.     glucose  blood test strip OneTouch Ultra Blue Test Strip  USE TO TEST BLOOD SUGAR ONCE DAILY     hydrOXYzine (VISTARIL) 25 MG capsule Take 25 mg by mouth at bedtime as needed.     LORazepam (ATIVAN) 0.5 MG tablet Take 0.5 mg by mouth at bedtime.  3   losartan-hydrochlorothiazide (HYZAAR) 100-12.5 MG tablet Take 1 tablet by mouth daily.     metFORMIN (GLUCOPHAGE-XR) 500 MG 24 hr tablet Take 500 mg by mouth 2 (two) times daily.     moxifloxacin (VIGAMOX) 0.5 % ophthalmic solution Place 1 drop into the right eye See admin instructions. Place 1 drop in right eye QID the day before, day of, and day after treatment.  5   rosuvastatin (CRESTOR) 20 MG tablet Take 20 mg by mouth at bedtime.     sildenafil (VIAGRA) 50 MG tablet Take 50 mg by mouth daily as needed.     testosterone cypionate (DEPOTESTOSTERONE CYPIONATE) 200 MG/ML injection Inject 200 mg into the muscle every 21 ( twenty-one) days.     valsartan-hydrochlorothiazide (DIOVAN-HCT) 320-25 MG tablet Take 1 tablet by mouth daily.     vitamin B-12 (CYANOCOBALAMIN) 1000 MCG tablet Take 1,000 mcg by mouth daily.     XARELTO 10 MG  TABS tablet TAKE 1 TABLET BY MOUTH EVERY DAY 30 tablet 11   No current facility-administered medications on file prior to visit.    Past Surgical History:  Procedure Laterality Date   CARDIAC CATHETERIZATION     greater 10 yrs ago, normal   CARPAL TUNNEL RELEASE     bilateral   COLONOSCOPY  06/2005   [facility] [place]   ESOPHAGOGASTRODUODENOSCOPY (EGD) WITH PROPOFOL N/A 09/28/2016   Procedure: ESOPHAGOGASTRODUODENOSCOPY (EGD) WITH PROPOFOL;  Surgeon: Iva Boop, MD;  [facility];  Service: Endoscopy;  Laterality: N/A;   IR GENERIC HISTORICAL  12/12/2016   IR US GUIDE VASC ACCESS RIGHT 12/12/2016 Jolaine Click, MD [facility]   IR GENERIC HISTORICAL  12/12/2016   IR VENOGRAM HEPATIC W HEMODYNAMIC EVALUATION 12/12/2016 Jolaine Click, MD [facility]   IR GENERIC HISTORICAL  12/12/2016   IR TRANSCATHETER BX  12/12/2016 Jolaine Click, MD [facility]   REVERSE SHOULDER ARTHROPLASTY Right 09/01/2021   Procedure: REVERSE SHOULDER ARTHROPLASTY;  Surgeon: Francena Hanly, MD;  Location: [facility];  Service: Orthopedics;  Laterality: Right;  ,   ROTATOR CUFF REPAIR     right   TENDON REPAIR     left arm   TONSILLECTOMY     TOTAL KNEE ARTHROPLASTY Right 2010   WISDOM TOOTH EXTRACTION      Allergies  Allergen Reactions   Ibuprofen Anaphylaxis and Other (See Comments)    Upper GI Bled   Nsaids Other (See Comments)    GI Bleed    BP 134/75   Ht 5\' 6"  (1.676 m)   Wt 174 lb (78.9 kg)   BMI 28.08 kg/m       No data to display              No data to display              Objective:  Physical Exam:  Gen: NAD, comfortable in exam room  Left hip: No deformity. Full range of motion. Mild tenderness to palpation over greater trochanter. Neurovascularly intact distally. Negative logroll Negative fadir.   Assessment & Plan:  1. Left hip injury - consistent with contusion of greater trochanter.  He is able to ambulate and negative logroll, fadir suggestive against intraarticular pathology.  Will obtain radiographs.  Icing, tylenol, walker.  F/u in 2-3 weeks or as needed if doing well.

## 2023-12-04 ENCOUNTER — Telehealth: Payer: Self-pay

## 2023-12-04 NOTE — Telephone Encounter (Signed)
[name] Encounter   Submitted application for assistance for Sprycel to [employer].   Application submitted via e-fax to [phone]   [employer]'s phone number [phone].   I will continue to check the status until final determination.    Ardeen Fillers, CPhT Oncology Pharmacy Patient Advocate  [facility]  [phone] (phone) [phone] (fax) 12/04/2023 10:59 AM

## 2023-12-04 NOTE — Telephone Encounter (Signed)
[name] Encounter   Received notification that patient is due for re-enrollment for assistance for Sprycel through [employer] Patient [facility]   Re-enrollment process has been initiated and will be submitted upon completion of necessary documents.  [employer]'s phone number [phone].   I will continue to follow until final determination.   Ardeen Fillers, CPhT Oncology Pharmacy Patient Advocate  [facility]  [phone] (phone) [phone] (fax) 12/04/2023 10:52 AM

## 2023-12-25 ENCOUNTER — Encounter (HOSPITAL_BASED_OUTPATIENT_CLINIC_OR_DEPARTMENT_OTHER): Payer: Self-pay | Admitting: Urology

## 2023-12-25 ENCOUNTER — Emergency Department (HOSPITAL_BASED_OUTPATIENT_CLINIC_OR_DEPARTMENT_OTHER)
Admission: EM | Admit: 2023-12-25 | Discharge: 2023-12-25 | Disposition: A | Discharge status: Home / Self Care | Payer: Medicare Other | Attending: Emergency Medicine | Admitting: Emergency Medicine

## 2023-12-25 ENCOUNTER — Other Ambulatory Visit: Payer: Self-pay

## 2023-12-25 ENCOUNTER — Emergency Department (HOSPITAL_BASED_OUTPATIENT_CLINIC_OR_DEPARTMENT_OTHER): Payer: Medicare Other

## 2023-12-25 DIAGNOSIS — U071 COVID-19: Secondary | ICD-10-CM | POA: Insufficient documentation

## 2023-12-25 DIAGNOSIS — Z20822 Contact with and (suspected) exposure to covid-19: Secondary | ICD-10-CM | POA: Insufficient documentation

## 2023-12-25 DIAGNOSIS — E119 Type 2 diabetes mellitus without complications: Secondary | ICD-10-CM | POA: Insufficient documentation

## 2023-12-25 DIAGNOSIS — Z7901 Long term (current) use of anticoagulants: Secondary | ICD-10-CM | POA: Diagnosis not present

## 2023-12-25 DIAGNOSIS — Z7984 Long term (current) use of oral hypoglycemic drugs: Secondary | ICD-10-CM | POA: Insufficient documentation

## 2023-12-25 DIAGNOSIS — R531 Weakness: Secondary | ICD-10-CM | POA: Diagnosis present

## 2023-12-25 LAB — CBC
HCT: 40.7 % (ref 39.0–52.0)
Hemoglobin: 13 g/dL (ref 13.0–17.0)
MCH: 28.4 pg (ref 26.0–34.0)
MCHC: 31.9 g/dL (ref 30.0–36.0)
MCV: 88.9 fL (ref 80.0–100.0)
Platelets: 182 10*3/uL (ref 150–400)
RBC: 4.58 MIL/uL (ref 4.22–5.81)
RDW: 21.6 % — ABNORMAL HIGH (ref 11.5–15.5)
WBC: 6.3 10*3/uL (ref 4.0–10.5)
nRBC: 0 % (ref 0.0–0.2)

## 2023-12-25 LAB — URINALYSIS, W/ REFLEX TO CULTURE (INFECTION SUSPECTED)
Bilirubin Urine: NEGATIVE
Glucose, UA: NEGATIVE mg/dL
Hgb urine dipstick: NEGATIVE
Ketones, ur: NEGATIVE mg/dL
Leukocytes,Ua: NEGATIVE
Nitrite: NEGATIVE
Protein, ur: NEGATIVE mg/dL
Specific Gravity, Urine: 1.015 (ref 1.005–1.030)
pH: 5.5 (ref 5.0–8.0)

## 2023-12-25 LAB — RESP PANEL BY RT-PCR (RSV, FLU A&B, COVID)  RVPGX2
Influenza A by PCR: NEGATIVE
Influenza B by PCR: NEGATIVE
Resp Syncytial Virus by PCR: NEGATIVE
SARS Coronavirus 2 by RT PCR: POSITIVE — AB

## 2023-12-25 LAB — COMPREHENSIVE METABOLIC PANEL
ALT: 19 U/L (ref 0–44)
AST: 25 U/L (ref 15–41)
Albumin: 3.8 g/dL (ref 3.5–5.0)
Alkaline Phosphatase: 54 U/L (ref 38–126)
Anion gap: 6 (ref 5–15)
BUN: 20 mg/dL (ref 8–23)
CO2: 23 mmol/L (ref 22–32)
Calcium: 10.5 mg/dL — ABNORMAL HIGH (ref 8.9–10.3)
Chloride: 105 mmol/L (ref 98–111)
Creatinine, Ser: 1.38 mg/dL — ABNORMAL HIGH (ref 0.61–1.24)
GFR, Estimated: 52 mL/min — ABNORMAL LOW (ref >60)
Glucose, Bld: 90 mg/dL (ref 70–99)
Potassium: 3.8 mmol/L (ref 3.5–5.1)
Sodium: 134 mmol/L — ABNORMAL LOW (ref 135–145)
Total Bilirubin: 0.5 mg/dL (ref 0.0–1.2)
Total Protein: 6.3 g/dL — ABNORMAL LOW (ref 6.5–8.1)

## 2023-12-25 LAB — LACTIC ACID, PLASMA: Lactic Acid, Venous: 1.1 mmol/L (ref 0.5–1.9)

## 2023-12-25 MED ORDER — SODIUM CHLORIDE 0.9 % IV BOLUS
500.0000 mL | Freq: Once | INTRAVENOUS | Status: AC
Start: 2023-12-25 — End: 2023-12-25
  Administered 2023-12-25: 500 mL via INTRAVENOUS

## 2023-12-25 MED ORDER — ACETAMINOPHEN 325 MG PO TABS
650.0000 mg | ORAL_TABLET | Freq: Once | ORAL | Status: AC
  Administered 2023-12-25: 650 mg via ORAL
  Filled 2023-12-25: qty 2

## 2023-12-25 MED ORDER — MOLNUPIRAVIR EUA 200MG CAPSULE: 4.0000 | ORAL_CAPSULE | Freq: Two times a day (BID) | ORAL | 0 refills | Status: AC

## 2023-12-25 NOTE — ED Triage Notes (Signed)
 Pt in wheelchair to triage states been feeling foggy x 1 week, states yesterday had congestion and hoarse voice  At 0300 today was dressed, per wife was a little confused and reports frequent urination, q 30 mins, denies any pain with urination  Also states weakness noted per pt, fever at time of triage   Concern for UTI

## 2023-12-25 NOTE — ED Provider Notes (Addendum)
 [facility] Provider Note   CSN: [id number] Arrival date & time: 12/25/23  1427     History  Chief Complaint  Patient presents with   Dysuria   Weakness    James [name]. is a 78 y.o. male with a history of chronic myelocytic leukemia, liver cirrhosis, and diabetes mellitus who presents the ED today for weakness.  Patient's wife reports that since yesterday patient has been having increased generalized weakness and confusion.  He uses a walker at baseline.  She states has been taking him longer to get around the house.  No recent head injuries or trauma.  She states that he has gotten up more frequently to go to the bathroom and thinks he might have an associated UTI.  He also has new onset nasal congestion, hoarseness, and cough.  Wife states the patient felt warm but never took a temperature at home.  Denies shortness of breath, chest pain, or abdominal pain. No additional complaints or concerns at this time.    Home Medications Prior to Admission medications   Medication Sig Start Date End Date Taking? Authorizing Provider  molnupiravir  EUA (LAGEVRIO ) 200 mg CAPS capsule Take 4 capsules (800 mg total) by mouth 2 (two) times daily for 5 days. 12/25/23 12/30/23 Yes Waddell Sluder, PA-C  amLODipine  (NORVASC ) 5 MG tablet Take 5 mg by mouth daily. Patient not taking: Reported on 11/07/2023 08/18/21   [provider]  buPROPion  (WELLBUTRIN  XL) 300 MG 24 hr tablet Take 300 mg by mouth every morning. 07/20/20   [provider]  dasatinib  (SPRYCEL ) 70 MG tablet Take 1 tablet (70 mg total) by mouth daily. 01/24/23   Timmy Maude SAUNDERS, MD  desmopressin  (DDAVP ) 0.2 MG tablet Take 400 mcg by mouth at bedtime. 12/28/21   [provider]  Dulaglutide  (TRULICITY ) 1.5 MG/0.5ML SOPN once a week. Patient not taking: Reported on 11/07/2023 05/15/23   [provider]  fluticasone  (FLONASE ) 50 MCG/ACT nasal spray Place 1 spray into both  nostrils as needed for allergies. 10/23/19   [provider]  glucose blood test strip OneTouch Ultra Blue Test Strip  USE TO TEST BLOOD SUGAR ONCE DAILY    [provider]  hydrOXYzine (VISTARIL) 25 MG capsule Take 25 mg by mouth at bedtime as needed.    [provider]  LORazepam  (ATIVAN ) 0.5 MG tablet Take 0.5 mg by mouth at bedtime. 11/01/18   [provider]  losartan -hydrochlorothiazide  (HYZAAR) 100-12.5 MG tablet Take 1 tablet by mouth daily. 01/23/22   [provider]  metFORMIN  (GLUCOPHAGE -XR) 500 MG 24 hr tablet Take 500 mg by mouth 2 (two) times daily. 11/08/20   [provider]  moxifloxacin  (VIGAMOX ) 0.5 % ophthalmic solution Place 1 drop into the right eye See admin instructions. Place 1 drop in right eye QID the day before, day of, and day after treatment. 02/15/18   [provider]  rosuvastatin  (CRESTOR ) 20 MG tablet Take 20 mg by mouth at bedtime. 12/18/22   [provider]  sildenafil (VIAGRA) 50 MG tablet Take 50 mg by mouth daily as needed. 10/26/14   [provider]  testosterone  cypionate (DEPOTESTOSTERONE CYPIONATE) 200 MG/ML injection Inject 200 mg into the muscle every 21 ( twenty-one) days.    [provider]  valsartan -hydrochlorothiazide  (DIOVAN -HCT) 320-25 MG tablet Take 1 tablet by mouth daily.    [provider]  vitamin B-12 (CYANOCOBALAMIN ) 1000 MCG tablet Take 1,000 mcg by mouth daily.  [provider]  XARELTO  10 MG TABS tablet TAKE 1 TABLET BY MOUTH EVERY DAY 09/21/23   Timmy Maude SAUNDERS, MD      Allergies    Ibuprofen and Nsaids    Review of Systems   Review of Systems  Neurological:  Positive for weakness.  All other systems reviewed and are negative.   Physical Exam Updated Vital Signs BP (!) 101/56   Pulse (!) 59   Temp 98.5 F (36.9 C)   Resp 17   Ht 5' 6 (1.676 m)   Wt 78.9 kg   SpO2 97%   BMI 28.08 kg/m  Physical Exam Vitals and nursing  note reviewed.  Constitutional:      General: He is not in acute distress.    Appearance: Normal appearance.  HENT:     Head: Normocephalic and atraumatic.     Mouth/Throat:     Mouth: Mucous membranes are moist.  Eyes:     Conjunctiva/sclera: Conjunctivae normal.     Pupils: Pupils are equal, round, and reactive to light.  Cardiovascular:     Rate and Rhythm: Normal rate and regular rhythm.     Pulses: Normal pulses.     Heart sounds: Normal heart sounds.  Pulmonary:     Effort: Pulmonary effort is normal.     Breath sounds: Normal breath sounds.  Abdominal:     Palpations: Abdomen is soft.     Tenderness: There is no abdominal tenderness.  Musculoskeletal:        General: Normal range of motion.  Skin:    General: Skin is warm and dry.     Findings: No rash.  Neurological:     General: No focal deficit present.     Mental Status: He is alert.     Sensory: No sensory deficit.     Motor: No weakness.  Psychiatric:        Mood and Affect: Mood normal.        Behavior: Behavior normal.    ED Results / Procedures / Treatments   Labs (all labs ordered are listed, but only abnormal results are displayed) Labs Reviewed  RESP PANEL BY RT-PCR (RSV, FLU A&B, COVID)  RVPGX2 - Abnormal; Notable for the following components:      Result Value   SARS Coronavirus 2 by RT PCR POSITIVE (*)    All other components within normal limits  CBC - Abnormal; Notable for the following components:   RDW 21.6 (*)    All other components within normal limits  COMPREHENSIVE METABOLIC PANEL - Abnormal; Notable for the following components:   Sodium 134 (*)    Creatinine, Ser 1.38 (*)    Calcium  10.5 (*)    Total Protein 6.3 (*)    GFR, Estimated 52 (*)    All other components within normal limits  URINALYSIS, W/ REFLEX TO CULTURE (INFECTION SUSPECTED) - Abnormal; Notable for the following components:   Bacteria, UA RARE (*)    All other components within normal limits  CULTURE, BLOOD  (ROUTINE X 2)  CULTURE, BLOOD (ROUTINE X 2)  LACTIC ACID, PLASMA    EKG EKG Interpretation Date/Time:  Tuesday December 25 2023 14:44:21 EST Ventricular Rate:  74 PR Interval:  158 QRS Duration:  135 QT Interval:  376 QTC Calculation: 418 R Axis:   -30  Text Interpretation: Sinus rhythm Baseline wander in lead(s) V4 Confirmed by Ruthe Cornet [phone]) on 12/25/2023 5:22:28 PM  Radiology DG Chest Portable 1 View Result Date:  12/25/2023 CLINICAL DATA:  Cough, fever, weakness. EXAM: PORTABLE CHEST 1 VIEW COMPARISON:  Chest x-ray 09/02/2021 FINDINGS: The heart size and mediastinal contours are within normal limits. Both lungs are clear. Right shoulder arthroplasty is present. IMPRESSION: No active disease. Electronically Signed   By: Greig Pique M.D.   On: 12/25/2023 15:35    Procedures Procedures: not indicated.   Medications Ordered in ED Medications  acetaminophen  (TYLENOL ) tablet 650 mg (650 mg Oral Given 12/25/23 1601)  sodium chloride  0.9 % bolus 500 mL (500 mLs Intravenous New Bag/Given 12/25/23 1811)    ED Course/ Medical Decision Making/ A&P                                 Medical Decision Making Amount and/or Complexity of Data Reviewed Labs: ordered. Radiology: ordered.  Risk OTC drugs.   This patient presents to the ED for concern of generalized weakness, this involves an extensive number of treatment options, and is a complaint that carries with it a high risk of complications and morbidity.   Differential diagnosis includes: flu, COVID, RSV, other viral infection, UTI, sepsis, etc.   Comorbidities  See HPI above   Additional History  Additional history obtained from prior records.   Cardiac Monitoring / EKG  The patient was maintained on a cardiac monitor.  I personally viewed and interpreted the cardiac monitored which showed: sinus rhythm with PACs, heart rate of 71 bpm.   Lab Tests  I ordered and personally interpreted labs.  The pertinent  results include:   CMP and CBC are within normal limits - no acute AKI, electrolyte derangement, infection, or anemia Respiratory panel is positive for COVID UA shows rare bacteria but otherwise unremarkable Negative lactic acid Blood cultures sent   Imaging Studies  I ordered imaging studies including CXR  I independently visualized and interpreted imaging which showed: no active disease. I agree with the radiologist interpretation   Problem List / ED Course / Critical Interventions / Medication Management  Generalized weakness since yesterday There are no neurologic focal deficits, vision changes, or slurred speech. Low suspicion for stroke. I ordered medications including: Tylenol  for fever  Fluids for dehydration Reevaluation of the patient after these medicines showed that the patient improved I have reviewed the patients home medicines and have made adjustments as needed Mulnupiravir sent to the pharmacy   Social Determinants of Health  Physical activity   Test / Admission - Considered  Discussed findings with patient and wife at bedside.  All questions were answered. He is stable and safe for discharge home. Return precautions provided.       Final Clinical Impression(s) / ED Diagnoses Final diagnoses:  COVID-19    Rx / DC Orders ED Discharge Orders          Ordered    molnupiravir  EUA (LAGEVRIO ) 200 mg CAPS capsule  2 times daily        12/25/23 1742              Waddell Sluder, PA-C 12/25/23 1841    Waddell Sluder, PA-C 12/25/23 1842    Ruthe Cornet, DO 12/25/23 1923

## 2023-12-25 NOTE — ED Notes (Signed)
 Reviewed D/C information with the patient, pt verbalized understanding. No additional concerns at this time.

## 2023-12-25 NOTE — Discharge Instructions (Addendum)
 As discussed, you're positive for COVID. Take Molnupiravir  800mg  twice a day for the next 5 days to help with your COVID symptoms. Make sure you're drinking plenty of fluids and staying hydrated. Take Tylenol  every 6-8 hours as needed for fever.  Follow up with your PCP in the next 5-7 days for reevaluation.  Get help right away if: You have trouble breathing or get short of breath. You have pain or pressure in your chest. You cannot speak or move any part of your body. You are confused. Your symptoms get worse.

## 2023-12-26 ENCOUNTER — Encounter: Payer: Self-pay | Admitting: Hematology & Oncology

## 2023-12-26 ENCOUNTER — Other Ambulatory Visit: Payer: Self-pay

## 2023-12-26 ENCOUNTER — Other Ambulatory Visit (HOSPITAL_COMMUNITY): Payer: Self-pay

## 2023-12-26 ENCOUNTER — Other Ambulatory Visit: Payer: Self-pay | Admitting: *Deleted

## 2023-12-26 DIAGNOSIS — C9210 Chronic myeloid leukemia, BCR/ABL-positive, not having achieved remission: Secondary | ICD-10-CM

## 2023-12-26 MED ORDER — DASATINIB 70 MG PO TABS
70.0000 mg | ORAL_TABLET | Freq: Every day | ORAL | 12 refills | Status: DC
  Filled 2023-12-26: qty 30, 30d supply, fill #0
  Filled 2024-01-22: qty 30, 30d supply, fill #1

## 2023-12-26 NOTE — Telephone Encounter (Signed)
 Called and left VM for patient to return my call to inform them of grant and get them OnBoarded and transitioned over to [facility]. I will continue to reach out to patient.   Call 1 - LVM 12/26/23   Morene Potters, CPhT Oncology Pharmacy Patient Advocate  [facility]  [phone] (phone) [phone] (fax) 12/26/2023 9:31 AM

## 2023-12-26 NOTE — Telephone Encounter (Signed)
 [name] Encounter   **PAP to [facility] in Jan 2025THORA Ferretti funding opened while application was processing. PAP application cancelled and grant funding obtained.   Was successful in securing patient a $8,000.00 grant from [employer] to provide copayment coverage for Spyrcel.  This will keep the out of pocket expense at $0.    The billing information is as follows and has been shared with [facility].   Member ID: [id number] Group ID: [id number] RxBin: [id number] PCN: [id number] Dates of Eligibility: 12/25/23 through 12/24/24  Fund name:  Chronic Myeloid Leukemia.   Morene Potters, CPhT Oncology Pharmacy Patient Advocate  [facility]  [phone] (phone) [phone] (fax) 12/26/2023 9:23 AM

## 2023-12-26 NOTE — Progress Notes (Signed)
 Specialty Pharmacy Initial Fill Coordination Note  James [name]. is a 78 y.o. male contacted today regarding initial fill of specialty medication(s) Dasatinib  (SPRYCEL )  Patient requested Delivery   Delivery date: 12/28/23   Verified address: [address] [place], [place] [address]  Medication will be filled on 12/27/23.   Patient is aware of $0.00 copayment. Bill [employer] Secondary.   Morene Potters, CPhT Oncology Pharmacy Patient Advocate  [facility]  [phone] (phone) [phone] (fax) 12/26/2023 3:31 PM

## 2023-12-26 NOTE — Progress Notes (Signed)
 Oral Chemotherapy Pharmacist Encounter  Patient was counseled under telephone encounter from 12/16/20.  James [name], PharmD, BCPS, BCOP Hematology/Oncology Clinical Pharmacist [facility] Oral Chemotherapy Navigation Clinics [phone] 12/26/2023 3:35 PM

## 2023-12-27 ENCOUNTER — Other Ambulatory Visit: Payer: Self-pay

## 2023-12-30 LAB — CULTURE, BLOOD (ROUTINE X 2)
Culture: NO GROWTH
Culture: NO GROWTH
Special Requests: ADEQUATE
Special Requests: ADEQUATE

## 2024-01-02 ENCOUNTER — Other Ambulatory Visit: Payer: Self-pay

## 2024-01-02 DIAGNOSIS — M25552 Pain in left hip: Secondary | ICD-10-CM

## 2024-01-02 NOTE — Progress Notes (Signed)
 Pt called asking for PT referral. He would like to go to [facility]. Referral sent. He will schedule f/u with Dr. Peggy Bowens 1 month after he starts PT.

## 2024-01-15 ENCOUNTER — Other Ambulatory Visit (HOSPITAL_COMMUNITY): Payer: Self-pay

## 2024-01-21 ENCOUNTER — Other Ambulatory Visit: Payer: Self-pay

## 2024-01-22 ENCOUNTER — Other Ambulatory Visit: Payer: Self-pay

## 2024-01-22 ENCOUNTER — Inpatient Hospital Stay: Payer: Medicare Other | Admitting: Hematology & Oncology

## 2024-01-22 ENCOUNTER — Other Ambulatory Visit (HOSPITAL_COMMUNITY): Payer: Self-pay | Admitting: Pharmacy Technician

## 2024-01-22 ENCOUNTER — Inpatient Hospital Stay: Payer: Medicare Other

## 2024-01-22 ENCOUNTER — Other Ambulatory Visit (HOSPITAL_COMMUNITY): Payer: Self-pay

## 2024-01-22 ENCOUNTER — Telehealth: Payer: Self-pay | Admitting: Hematology & Oncology

## 2024-01-22 NOTE — Progress Notes (Signed)
 Specialty Pharmacy Ongoing Clinical Assessment Note  James [name]. is a 78 y.o. male who is being followed by the specialty pharmacy service for RxSp Oncology   Patient's specialty medication(s) reviewed today: Dasatinib  (SPRYCEL )   Missed doses in the last 4 weeks: 0   Patient/Caregiver did not have any additional questions or concerns.   Therapeutic benefit summary: Patient is achieving benefit   Adverse events/side effects summary: No adverse events/side effects   Patient's therapy is appropriate to: Continue    Goals Addressed             This Visit's Progress    Slow Disease Progression   On track    Patient is on track. Patient will maintain adherence.         Follow up:  3 months  James [name] M Theordore Cisnero Specialty Pharmacist

## 2024-01-22 NOTE — Progress Notes (Signed)
 Specialty Pharmacy Refill Coordination Note  James [name]. is a 78 y.o. male contacted today regarding refills of specialty medication(s) Dasatinib  (SPRYCEL )   Patient requested Delivery   Delivery date: 01/31/24   Verified address: Patient address [address]  [place]   Medication will be filled on 01/30/24.

## 2024-01-22 NOTE — Telephone Encounter (Signed)
Returned patient's after hours call about their 01/22/24 appt. Appt canceled and pt instructed to return call when able via voicemail

## 2024-01-24 ENCOUNTER — Other Ambulatory Visit: Payer: Self-pay | Admitting: Medical Oncology

## 2024-01-24 ENCOUNTER — Inpatient Hospital Stay: Payer: Medicare Other | Admitting: Hematology & Oncology

## 2024-01-24 ENCOUNTER — Inpatient Hospital Stay: Payer: Medicare Other | Attending: Hematology & Oncology

## 2024-01-24 ENCOUNTER — Encounter: Payer: Self-pay | Admitting: Hematology & Oncology

## 2024-01-24 ENCOUNTER — Inpatient Hospital Stay: Payer: Medicare Other

## 2024-01-24 VITALS — BP 128/93 | HR 88 | Temp 98.9°F | Resp 20 | Ht 66.0 in | Wt 172.0 lb

## 2024-01-24 DIAGNOSIS — C9210 Chronic myeloid leukemia, BCR/ABL-positive, not having achieved remission: Secondary | ICD-10-CM | POA: Diagnosis not present

## 2024-01-24 DIAGNOSIS — D500 Iron deficiency anemia secondary to blood loss (chronic): Secondary | ICD-10-CM

## 2024-01-24 DIAGNOSIS — D45 Polycythemia vera: Secondary | ICD-10-CM

## 2024-01-24 DIAGNOSIS — D751 Secondary polycythemia: Secondary | ICD-10-CM | POA: Insufficient documentation

## 2024-01-24 LAB — CBC WITH DIFFERENTIAL (CANCER CENTER ONLY)
Abs Immature Granulocytes: 0.02 10*3/uL (ref 0.00–0.07)
Basophils Absolute: 0 10*3/uL (ref 0.0–0.1)
Basophils Relative: 0 %
Eosinophils Absolute: 0.3 10*3/uL (ref 0.0–0.5)
Eosinophils Relative: 4 %
HCT: 47.3 % (ref 39.0–52.0)
Hemoglobin: 15.6 g/dL (ref 13.0–17.0)
Immature Granulocytes: 0 %
Lymphocytes Relative: 14 %
Lymphs Abs: 1 10*3/uL (ref 0.7–4.0)
MCH: 29.9 pg (ref 26.0–34.0)
MCHC: 33 g/dL (ref 30.0–36.0)
MCV: 90.6 fL (ref 80.0–100.0)
Monocytes Absolute: 1.2 10*3/uL — ABNORMAL HIGH (ref 0.1–1.0)
Monocytes Relative: 17 %
Neutro Abs: 4.3 10*3/uL (ref 1.7–7.7)
Neutrophils Relative %: 65 %
Platelet Count: 235 10*3/uL (ref 150–400)
RBC: 5.22 MIL/uL (ref 4.22–5.81)
RDW: 19.9 % — ABNORMAL HIGH (ref 11.5–15.5)
WBC Count: 6.7 10*3/uL (ref 4.0–10.5)
nRBC: 0 % (ref 0.0–0.2)

## 2024-01-24 LAB — CMP (CANCER CENTER ONLY)
ALT: 15 U/L (ref 0–44)
AST: 24 U/L (ref 15–41)
Albumin: 4.4 g/dL (ref 3.5–5.0)
Alkaline Phosphatase: 80 U/L (ref 38–126)
Anion gap: 8 (ref 5–15)
BUN: 14 mg/dL (ref 8–23)
CO2: 25 mmol/L (ref 22–32)
Calcium: 11 mg/dL — ABNORMAL HIGH (ref 8.9–10.3)
Chloride: 106 mmol/L (ref 98–111)
Creatinine: 1.48 mg/dL — ABNORMAL HIGH (ref 0.61–1.24)
GFR, Estimated: 48 mL/min — ABNORMAL LOW (ref >60)
Glucose, Bld: 120 mg/dL — ABNORMAL HIGH (ref 70–99)
Potassium: 4.3 mmol/L (ref 3.5–5.1)
Sodium: 139 mmol/L (ref 135–145)
Total Bilirubin: 0.5 mg/dL (ref 0.0–1.2)
Total Protein: 6.9 g/dL (ref 6.5–8.1)

## 2024-01-24 LAB — IRON AND IRON BINDING CAPACITY (CC-WL,HP ONLY)
Iron: 57 ug/dL (ref 45–182)
Saturation Ratios: 16 % — ABNORMAL LOW (ref 17.9–39.5)
TIBC: 349 ug/dL (ref 250–450)
UIBC: 292 ug/dL (ref 117–376)

## 2024-01-24 LAB — FERRITIN: Ferritin: 24 ng/mL (ref 24–336)

## 2024-01-24 NOTE — Progress Notes (Addendum)
 Hematology and Oncology Follow Up Visit  James [name] [id number] [birth year] 78 y.o. 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 IV iron as indicated - last received in March 2022        Interim History:  James [name] is here today for follow-up.  Doing pretty well.  He still recovering from when he had a fall.  As far as his CML is concerned, I think that this is doing quite well.  I think his BCR/ABL was negative last time that we saw him.  As such, we stop the Sprycel .  He has been on Sprycel  for over 3 years.   He is on Xarelto .  He is doing okay on Xarelto .  He has had no bleeding.  There has been no problems with fever.  He had no problems over the Holiday season.  Currently, I was asked perform status is ECOG 1.     Medications:  Allergies as of 01/24/2024       Reactions   Ibuprofen Anaphylaxis, Other (See Comments)   Upper GI Bled   Nsaids Other (See Comments)   GI Bleed        Medication List        Accurate as of January 24, 2024 12:51 PM. If you have any questions, ask your nurse or doctor.          amLODipine  5 MG tablet Commonly known as: NORVASC  Take 5 mg by mouth daily.   buPROPion  300 MG 24 hr tablet Commonly known as: WELLBUTRIN  XL Take 300 mg by mouth every morning.   cyanocobalamin  1000 MCG tablet Commonly known as: VITAMIN B12 Take 1,000 mcg by mouth daily.   dasatinib  70 MG tablet Commonly known as: Sprycel  Take 1 tablet (70 mg total) by mouth daily.   desmopressin  0.2 MG tablet Commonly known as: DDAVP  Take 400 mcg by mouth at bedtime.   fluticasone  50 MCG/ACT nasal spray Commonly known as: FLONASE  Place 1 spray into both nostrils as needed for allergies.   glucose blood test strip OneTouch  Ultra Blue Test Strip  USE TO TEST BLOOD SUGAR ONCE DAILY   hydrOXYzine 25 MG capsule Commonly known as: VISTARIL Take 25 mg by mouth at bedtime as needed.   LORazepam  0.5 MG tablet Commonly known as: ATIVAN  Take 0.5 mg by mouth at bedtime.   losartan -hydrochlorothiazide  100-12.5 MG tablet Commonly known as: HYZAAR Take 1 tablet by mouth daily.   metFORMIN  500 MG 24 hr tablet Commonly known as: GLUCOPHAGE -XR Take 500 mg by mouth 2 (two) times daily.   moxifloxacin  0.5 % ophthalmic solution Commonly known as: VIGAMOX  Place 1 drop into the right eye See admin instructions. Place 1 drop in right eye QID the day before, day of, and day after treatment.   rosuvastatin  20 MG tablet Commonly known as: CRESTOR  Take 20 mg by mouth at bedtime.   sildenafil 50 MG tablet Commonly known as: VIAGRA Take 50 mg by mouth daily as needed.   testosterone  cypionate 200 MG/ML injection Commonly known as: DEPOTESTOSTERONE CYPIONATE Inject 200 mg into the muscle every 21 ( twenty-one) days.   Trulicity  1.5 MG/0.5ML Soaj Generic drug: Dulaglutide  once a week.   valsartan -hydrochlorothiazide  320-25 MG tablet Commonly known as: DIOVAN -HCT Take 1 tablet  by mouth daily.   Xarelto  10 MG Tabs tablet Generic drug: rivaroxaban  TAKE 1 TABLET BY MOUTH EVERY DAY        Allergies:  Allergies  Allergen Reactions   Ibuprofen Anaphylaxis and Other (See Comments)    Upper GI Bled   Nsaids Other (See Comments)    GI Bleed    Past Medical History, Surgical history, Social history, and Family History were reviewed and updated.  Review of Systems: Review of Systems  Constitutional: Negative.   HENT: Negative.    Eyes: Negative.   Respiratory: Negative.    Cardiovascular: Negative.   Gastrointestinal: Negative.   Genitourinary: Negative.   Musculoskeletal: Negative.   Skin: Negative.   Neurological: Negative.   Endo/Heme/Allergies: Negative.   Psychiatric/Behavioral: Negative.       Physical Exam:  height is 5' 6 (1.676 m) and weight is 172 lb (78 kg). His oral temperature is 98.9 F (37.2 C). His blood pressure is 128/93 (abnormal) and his pulse is 88. His respiration is 20 and oxygen saturation is 94%.   Wt Readings from Last 3 Encounters:  01/24/24 172 lb (78 kg)  12/25/23 173 lb 15.1 oz (78.9 kg)  11/21/23 174 lb (78.9 kg)    Physical Exam Vitals reviewed.  HENT:     Head: Normocephalic and atraumatic.  Eyes:     Pupils: Pupils are equal, round, and reactive to light.  Cardiovascular:     Rate and Rhythm: Normal rate and regular rhythm.     Heart sounds: Normal heart sounds.  Pulmonary:     Effort: Pulmonary effort is normal.     Breath sounds: Normal breath sounds.  Abdominal:     General: Bowel sounds are normal.     Palpations: Abdomen is soft.  Musculoskeletal:        General: No tenderness or deformity. Normal range of motion.     Cervical back: Normal range of motion.  Lymphadenopathy:     Cervical: No cervical adenopathy.  Skin:    General: Skin is warm and dry.     Findings: No erythema or rash.  Neurological:     Mental Status: He is alert and oriented to person, place, and time.  Psychiatric:        Behavior: Behavior normal.        Thought Content: Thought content normal.        Judgment: Judgment normal.      Lab Results  Component Value Date   WBC 6.7 01/24/2024   HGB 15.6 01/24/2024   HCT 47.3 01/24/2024   MCV 90.6 01/24/2024   PLT 235 01/24/2024   Lab Results  Component Value Date   FERRITIN 9 (L) 09/25/2023   IRON 48 09/25/2023   TIBC 419 09/25/2023   UIBC 371 09/25/2023   IRONPCTSAT 12 (L) 09/25/2023   Lab Results  Component Value Date   RETICCTPCT 2.7 12/27/2022   RBC 5.22 01/24/2024   RETICCTABS 94.0 08/30/2011   No results found for: JONATHAN BONG, [facility] Lab Results  Component Value Date   IGGSERUM 828 10/11/2016   IGA 177 06/22/2016   No results found for:  STEPHANY RINGS, A1GS, A2GS, BETS, BETA2SER, GAMS, MSPIKE, SPEI   Chemistry      Component Value Date/Time   NA 139 01/24/2024 1157   NA 137 10/29/2017 1307   NA 134 (L) 05/10/2017 0755   K 4.3 01/24/2024 1157   K 3.6 10/29/2017 1307   K 4.2 05/10/2017 0755   CL 106  01/24/2024 1157   CL 101 10/29/2017 1307   CO2 25 01/24/2024 1157   CO2 25 10/29/2017 1307   CO2 20 (L) 05/10/2017 0755   BUN 14 01/24/2024 1157   BUN 14 10/29/2017 1307   BUN 21.0 05/10/2017 0755   CREATININE 1.48 (H) 01/24/2024 1157   CREATININE 1.4 (H) 10/29/2017 1307   CREATININE 1.3 05/10/2017 0755      Component Value Date/Time   CALCIUM  11.0 (H) 01/24/2024 1157   CALCIUM  10.3 10/29/2017 1307   CALCIUM  10.3 05/10/2017 0755   ALKPHOS 80 01/24/2024 1157   ALKPHOS 77 10/29/2017 1307   ALKPHOS 97 05/10/2017 0755   AST 24 01/24/2024 1157   AST 25 05/10/2017 0755   ALT 15 01/24/2024 1157   ALT 27 10/29/2017 1307   ALT 26 05/10/2017 0755   BILITOT 0.5 01/24/2024 1157   BILITOT 0.70 05/10/2017 0755     Impression and Plan: James [name] is 78 year old gentleman with a long history of polycythemia. He is JAK2 negative.  He has transformed over to [facility].  He is on Sprycel .  He is responding quite nicely.  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  Again, if his BCR/ABL is negative, we will get him off the Sprycel .  He does need to be phlebotomized.  We will have to see about the phlebotomy in a week or so.  I am assuming that we are going to get him off the Sprycel .  As such, I probably will have to get him back here in about 3 months.   Maude JONELLE Crease, MD 2/6/202512:51 PM

## 2024-01-24 NOTE — Progress Notes (Signed)
 BP remains elevated, 127/105, instructed to monitor BP at home and notify PCP if it remains over 140/90. Verbalized understanding.

## 2024-01-26 ENCOUNTER — Observation Stay (HOSPITAL_BASED_OUTPATIENT_CLINIC_OR_DEPARTMENT_OTHER)
Admission: EM | Admit: 2024-01-26 | Discharge: 2024-01-28 | Disposition: A | Discharge status: Home / Self Care | Payer: Medicare Other | Attending: Internal Medicine | Admitting: Internal Medicine

## 2024-01-26 ENCOUNTER — Emergency Department (HOSPITAL_BASED_OUTPATIENT_CLINIC_OR_DEPARTMENT_OTHER): Payer: Medicare Other

## 2024-01-26 ENCOUNTER — Encounter (HOSPITAL_BASED_OUTPATIENT_CLINIC_OR_DEPARTMENT_OTHER): Payer: Self-pay | Admitting: Emergency Medicine

## 2024-01-26 DIAGNOSIS — Z79899 Other long term (current) drug therapy: Secondary | ICD-10-CM | POA: Diagnosis not present

## 2024-01-26 DIAGNOSIS — F32A Depression, unspecified: Secondary | ICD-10-CM

## 2024-01-26 DIAGNOSIS — R351 Nocturia: Secondary | ICD-10-CM | POA: Diagnosis not present

## 2024-01-26 DIAGNOSIS — G9340 Encephalopathy, unspecified: Secondary | ICD-10-CM | POA: Diagnosis not present

## 2024-01-26 DIAGNOSIS — D45 Polycythemia vera: Secondary | ICD-10-CM | POA: Insufficient documentation

## 2024-01-26 DIAGNOSIS — Z87891 Personal history of nicotine dependence: Secondary | ICD-10-CM | POA: Insufficient documentation

## 2024-01-26 DIAGNOSIS — R4182 Altered mental status, unspecified: Principal | ICD-10-CM

## 2024-01-26 DIAGNOSIS — Z7984 Long term (current) use of oral hypoglycemic drugs: Secondary | ICD-10-CM | POA: Insufficient documentation

## 2024-01-26 DIAGNOSIS — Z7985 Long-term (current) use of injectable non-insulin antidiabetic drugs: Secondary | ICD-10-CM | POA: Diagnosis not present

## 2024-01-26 DIAGNOSIS — Z96611 Presence of right artificial shoulder joint: Secondary | ICD-10-CM | POA: Diagnosis not present

## 2024-01-26 DIAGNOSIS — Z96651 Presence of right artificial knee joint: Secondary | ICD-10-CM | POA: Insufficient documentation

## 2024-01-26 DIAGNOSIS — I10 Essential (primary) hypertension: Secondary | ICD-10-CM | POA: Insufficient documentation

## 2024-01-26 DIAGNOSIS — E785 Hyperlipidemia, unspecified: Secondary | ICD-10-CM | POA: Insufficient documentation

## 2024-01-26 DIAGNOSIS — Z856 Personal history of leukemia: Secondary | ICD-10-CM | POA: Insufficient documentation

## 2024-01-26 DIAGNOSIS — Z7901 Long term (current) use of anticoagulants: Secondary | ICD-10-CM | POA: Insufficient documentation

## 2024-01-26 DIAGNOSIS — K219 Gastro-esophageal reflux disease without esophagitis: Secondary | ICD-10-CM | POA: Diagnosis not present

## 2024-01-26 DIAGNOSIS — F419 Anxiety disorder, unspecified: Secondary | ICD-10-CM

## 2024-01-26 DIAGNOSIS — E119 Type 2 diabetes mellitus without complications: Secondary | ICD-10-CM | POA: Insufficient documentation

## 2024-01-26 LAB — COMPREHENSIVE METABOLIC PANEL
ALT: 20 U/L (ref 0–44)
AST: 33 U/L (ref 15–41)
Albumin: 4.1 g/dL (ref 3.5–5.0)
Alkaline Phosphatase: 66 U/L (ref 38–126)
Anion gap: 6 (ref 5–15)
BUN: 22 mg/dL (ref 8–23)
CO2: 23 mmol/L (ref 22–32)
Calcium: 10.5 mg/dL — ABNORMAL HIGH (ref 8.9–10.3)
Chloride: 105 mmol/L (ref 98–111)
Creatinine, Ser: 1.45 mg/dL — ABNORMAL HIGH (ref 0.61–1.24)
GFR, Estimated: 49 mL/min — ABNORMAL LOW (ref >60)
Glucose, Bld: 85 mg/dL (ref 70–99)
Potassium: 3.7 mmol/L (ref 3.5–5.1)
Sodium: 134 mmol/L — ABNORMAL LOW (ref 135–145)
Total Bilirubin: 0.8 mg/dL (ref 0.0–1.2)
Total Protein: 6.9 g/dL (ref 6.5–8.1)

## 2024-01-26 LAB — URINALYSIS, ROUTINE W REFLEX MICROSCOPIC
Bilirubin Urine: NEGATIVE
Glucose, UA: NEGATIVE mg/dL
Hgb urine dipstick: NEGATIVE
Ketones, ur: NEGATIVE mg/dL
Leukocytes,Ua: NEGATIVE
Nitrite: NEGATIVE
Protein, ur: NEGATIVE mg/dL
Specific Gravity, Urine: >=1.03 (ref 1.005–1.030)
pH: 5.5 (ref 5.0–8.0)

## 2024-01-26 LAB — CBC WITH DIFFERENTIAL/PLATELET
Abs Immature Granulocytes: 0.01 10*3/uL (ref 0.00–0.07)
Basophils Absolute: 0 10*3/uL (ref 0.0–0.1)
Basophils Relative: 1 %
Eosinophils Absolute: 0.2 10*3/uL (ref 0.0–0.5)
Eosinophils Relative: 3 %
HCT: 44.2 % (ref 39.0–52.0)
Hemoglobin: 14.4 g/dL (ref 13.0–17.0)
Immature Granulocytes: 0 %
Lymphocytes Relative: 16 %
Lymphs Abs: 0.9 10*3/uL (ref 0.7–4.0)
MCH: 29.5 pg (ref 26.0–34.0)
MCHC: 32.6 g/dL (ref 30.0–36.0)
MCV: 90.6 fL (ref 80.0–100.0)
Monocytes Absolute: 0.9 10*3/uL (ref 0.1–1.0)
Monocytes Relative: 15 %
Neutro Abs: 4 10*3/uL (ref 1.7–7.7)
Neutrophils Relative %: 65 %
Platelets: 238 10*3/uL (ref 150–400)
RBC: 4.88 MIL/uL (ref 4.22–5.81)
RDW: 19.9 % — ABNORMAL HIGH (ref 11.5–15.5)
WBC: 6 10*3/uL (ref 4.0–10.5)
nRBC: 0 % (ref 0.0–0.2)

## 2024-01-26 LAB — I-STAT CHEM 8, ED
BUN: 22 mg/dL (ref 8–23)
Calcium, Ion: 1.44 mmol/L — ABNORMAL HIGH (ref 1.15–1.40)
Chloride: 103 mmol/L (ref 98–111)
Creatinine, Ser: 1.6 mg/dL — ABNORMAL HIGH (ref 0.61–1.24)
Glucose, Bld: 81 mg/dL (ref 70–99)
HCT: 47 % (ref 39.0–52.0)
Hemoglobin: 16 g/dL (ref 13.0–17.0)
Potassium: 3.8 mmol/L (ref 3.5–5.1)
Sodium: 139 mmol/L (ref 135–145)
TCO2: 21 mmol/L — ABNORMAL LOW (ref 22–32)

## 2024-01-26 MED ORDER — SODIUM CHLORIDE 0.9 % IV BOLUS
500.0000 mL | Freq: Once | INTRAVENOUS | Status: AC
  Administered 2024-01-26: 500 mL via INTRAVENOUS

## 2024-01-26 MED ORDER — IOHEXOL 350 MG/ML SOLN
75.0000 mL | Freq: Once | INTRAVENOUS | Status: AC | PRN
  Administered 2024-01-26: 75 mL via INTRAVENOUS

## 2024-01-26 NOTE — Progress Notes (Signed)
 Plan of Care Note for accepted transfer   Patient: James [name]. MRN: [id number]   DOA: 01/26/2024  Facility requesting transfer: [facility]   Requesting Provider: Dr. Nicholaus   Reason for transfer: Confusion, off-balance   Facility course: 78 yr old male with HTN, DM2, OSA, CML, polycythemia vera, and portal vein thrombosis who may have had increase in gait difficulty yesterday afternoon and then confusion today.  He had been forgetting things today and was asking the same question repeatedly.  The cognitive changes may be improving but the patient still feels that his balance is worse than normal. There are no acute abnormalities on basic blood work, urinalysis, or head CT.  Patient was given 500 mL of NS in the ED.  Plan of care: The patient is accepted for admission to Telemetry unit, at [facility]   Author: Evalene GORMAN Sprinkles, MD 01/26/2024  Check [email or web address] for on-call coverage.  Nursing staff, Please call [facility] Admits & Consults System-Wide number on Amion as soon as patient's arrival, so appropriate admitting provider can evaluate the pt.

## 2024-01-26 NOTE — ED Provider Notes (Signed)
 [facility] [place] [facility] Provider Note   CSN: [id number] Arrival date & time: 01/26/24  1605     History  Chief Complaint  Patient presents with   Altered Mental Status    James Maul. is a 78 y.o. male.   Altered Mental Status  79 year old male history of CML, depression, diabetes, GERD, hypertension, hyperlipidemia, polycythemia presenting for balance issues, dizziness.  Patient is here with his wife.  Patient states he has had trouble walking and shuffling gait for about a year.  Yesterday sometime around lunch or in the afternoon he felt somewhat more off balance.  His wife did not notice this at the time but patient states he definitely did not feel his normal self yesterday.  Around 830 this morning patient noticed he had some disorientation and confusion.  He went driving around and when he came home around 1030 his wife states he had trouble remembering where all he went.  She noted he has had repetitive questioning since around 1030.  He is still feeling somewhat dizzy which she has a hard time describing.  Worse when trying to walk.  He has chronic knee pain.  He has not had any recent falls or injuries.  No headache or vision changes or difficulty with speech.  No fevers or chills.     Home Medications Prior to Admission medications   Medication Sig Start Date End Date Taking? Authorizing Provider  amLODipine  (NORVASC ) 5 MG tablet Take 5 mg by mouth daily. Patient not taking: Reported on 01/24/2024 08/18/21   [provider]  buPROPion  (WELLBUTRIN  XL) 300 MG 24 hr tablet Take 300 mg by mouth every morning. 07/20/20   [provider]  dasatinib  (SPRYCEL ) 70 MG tablet Take 1 tablet (70 mg total) by mouth daily. 12/26/23   Timmy Maude SAUNDERS, MD  desmopressin  (DDAVP ) 0.2 MG tablet Take 400 mcg by mouth at bedtime. 12/28/21   [provider]  Dulaglutide  (TRULICITY ) 1.5 MG/0.5ML SOPN once a week. Patient not taking: Reported  on 01/24/2024 05/15/23   [provider]  fluticasone  (FLONASE ) 50 MCG/ACT nasal spray Place 1 spray into both nostrils as needed for allergies. 10/23/19   [provider]  glucose blood test strip OneTouch Ultra Blue Test Strip  USE TO TEST BLOOD SUGAR ONCE DAILY    [provider]  hydrOXYzine (VISTARIL) 25 MG capsule Take 25 mg by mouth at bedtime as needed. Patient not taking: Reported on 01/24/2024    [provider]  LORazepam  (ATIVAN ) 0.5 MG tablet Take 0.5 mg by mouth at bedtime. 11/01/18   [provider]  losartan -hydrochlorothiazide  (HYZAAR) 100-12.5 MG tablet Take 1 tablet by mouth daily. 01/23/22   [provider]  metFORMIN  (GLUCOPHAGE -XR) 500 MG 24 hr tablet Take 500 mg by mouth 2 (two) times daily. 11/08/20   [provider]  moxifloxacin  (VIGAMOX ) 0.5 % ophthalmic solution Place 1 drop into the right eye See admin instructions. Place 1 drop in right eye QID the day before, day of, and day after treatment. 02/15/18   [provider]  rosuvastatin  (CRESTOR ) 20 MG tablet Take 20 mg by mouth at bedtime. 12/18/22   [provider]  sildenafil (VIAGRA) 50 MG tablet Take 50 mg by mouth daily as needed. 10/26/14   [provider]  testosterone  cypionate (DEPOTESTOSTERONE CYPIONATE) 200 MG/ML injection Inject 200 mg into the muscle every 21 ( twenty-one) days.    [provider]  valsartan -hydrochlorothiazide  (DIOVAN -HCT) 320-25 MG  tablet Take 1 tablet by mouth daily.    [provider]  vitamin B-12 (CYANOCOBALAMIN ) 1000 MCG tablet Take 1,000 mcg by mouth daily.    [provider]  XARELTO  10 MG TABS tablet TAKE 1 TABLET BY MOUTH EVERY DAY 09/21/23   Timmy Maude SAUNDERS, MD      Allergies    Ibuprofen and Nsaids    Review of Systems   Review of Systems Review of systems completed and notable as per HPI.  ROS otherwise negative.   Physical Exam Updated Vital Signs BP 126/68   Pulse  77   Temp 97.6 F (36.4 C) (Oral)   Resp 18   Ht 5' 6 (1.676 m)   Wt 78 kg   SpO2 96%   BMI 27.75 kg/m  Physical Exam Vitals and nursing note reviewed.  Constitutional:      General: He is not in acute distress.    Appearance: He is well-developed.  HENT:     Head: Normocephalic and atraumatic.     Nose: Nose normal.     Mouth/Throat:     Mouth: Mucous membranes are moist.     Pharynx: Oropharynx is clear.  Eyes:     Extraocular Movements: Extraocular movements intact.     Conjunctiva/sclera: Conjunctivae normal.     Pupils: Pupils are equal, round, and reactive to light.  Cardiovascular:     Rate and Rhythm: Normal rate and regular rhythm.     Heart sounds: No murmur heard. Pulmonary:     Effort: Pulmonary effort is normal. No respiratory distress.     Breath sounds: Normal breath sounds.  Abdominal:     Palpations: Abdomen is soft.     Tenderness: There is no abdominal tenderness.  Musculoskeletal:        General: No swelling.     Cervical back: Normal range of motion and neck supple. No rigidity or tenderness.  Skin:    General: Skin is warm and dry.     Capillary Refill: Capillary refill takes less than 2 seconds.  Neurological:     Mental Status: He is alert and oriented to person, place, and time.     Cranial Nerves: No cranial nerve deficit.     Sensory: No sensory deficit.     Motor: No weakness.     Comments: Patient is awake and alert.  He is oriented to person, place, time, situation.  He has normal speech without dysarthria or aphasia.  No neglect.  No visual field cuts.  His current nerves are intact.  No facial asymmetry.  Normal finger-to-nose and heel-to-shin bilaterally.  He has normal sensation throughout and normal strength in all extremities.  He does not have any issues with identifying objects or short-term memory on my exam.  When I stand him up he has a shuffling gait and feels somewhat off balance.  Psychiatric:        Mood and Affect: Mood  normal.     ED Results / Procedures / Treatments   Labs (all labs ordered are listed, but only abnormal results are displayed) Labs Reviewed  COMPREHENSIVE METABOLIC PANEL - Abnormal; Notable for the following components:      Result Value   Sodium 134 (*)    Creatinine, Ser 1.45 (*)    Calcium  10.5 (*)    GFR, Estimated 49 (*)    All other components within normal limits  CBC WITH DIFFERENTIAL/PLATELET - Abnormal; Notable for the following components:   RDW 19.9 (*)  All other components within normal limits  I-STAT CHEM 8, ED - Abnormal; Notable for the following components:   Creatinine, Ser 1.60 (*)    Calcium , Ion 1.44 (*)    TCO2 21 (*)    All other components within normal limits  URINALYSIS, ROUTINE W REFLEX MICROSCOPIC    EKG EKG Interpretation Date/Time:  Saturday January 26 2024 16:51:15 EST Ventricular Rate:  75 PR Interval:  176 QRS Duration:  93 QT Interval:  404 QTC Calculation: 452 R Axis:   -49  Text Interpretation: Sinus rhythm Left anterior fascicular block Abnormal R-wave progression, late transition Confirmed by Nicholaus Dolphin [phone]) on 01/26/2024 5:47:02 PM  Radiology CT Angio Head Neck W WO CM Result Date: 01/26/2024 CLINICAL DATA:  Confusion beginning at 11 a.m. today. EXAM: CT ANGIOGRAPHY HEAD AND NECK WITH AND WITHOUT CONTRAST TECHNIQUE: Multidetector CT imaging of the head and neck was performed using the standard protocol during bolus administration of intravenous contrast. Multiplanar CT image reconstructions and MIPs were obtained to evaluate the vascular anatomy. Carotid stenosis measurements (when applicable) are obtained utilizing NASCET criteria, using the distal internal carotid diameter as the denominator. RADIATION DOSE REDUCTION: This exam was performed according to the departmental dose-optimization program which includes automated exposure control, adjustment of the mA and/or kV according to patient size and/or use of iterative  reconstruction technique. CONTRAST:  75mL OMNIPAQUE  IOHEXOL  350 MG/ML SOLN COMPARISON:  MR head without contrast 01/27/2023. CT head without contrast 01/26/2023. FINDINGS: CT HEAD FINDINGS Brain: Mild generalized atrophy and white matter disease scratched at moderate generalized atrophy and white matter disease is again noted. Deep brain nuclei are within normal limits. The ventricles are proportionate to the degree of atrophy. No significant extraaxial fluid collection is present. A remote infarct is present in the medial right occipital pole. The brainstem and cerebellum are within normal limits. Midline structures are within normal limits. Vascular: Atherosclerotic calcifications are present within the cavernous internal carotid arteries. No hyperdense vessel is present. Skull: Calvarium is intact. No focal lytic or blastic lesions are present. No significant extracranial soft tissue lesion is present. Sinuses/Orbits: The paranasal sinuses and mastoid air cells are clear. Bilateral lens replacements are noted. Globes and orbits are otherwise unremarkable. Review of the MIP images confirms the above findings CTA NECK FINDINGS Aortic arch: Atherosclerotic calcifications are present in the aortic arch and at the great vessel origins without focal stenosis. No aneurysm is present. No dissection is present. Right carotid system: The right common carotid artery is within normal limits. Atherosclerotic calcifications are present. No significant stenosis is present. Mild tortuosity is present cervical right ICA without significant stenosis. Left carotid system: The left common carotid artery is within normal limits. Atherosclerotic changes are present at the bifurcation. No significant stenosis is present. Mild tortuosity is present cervical left ICA without significant stenosis. Vertebral arteries: Vertebral arteries are codominant. Both vertebral arteries originate from the subclavian arteries without significant  stenosis. No significant stenosis is present in either vertebral artery in the neck. Skeleton: Multilevel degenerative changes are present in the cervical spine. Slight anterolisthesis is present at C2-3 and C3-4. Grade 1 anterolisthesis at C4-5 measures 3 mm. Straightening of the normal cervical lordosis is present. Endplate sclerotic changes are present from C3-4 through C6-7. Vertebral body heights are normal. No focal osseous lesions are present. Other neck: Soft tissues the neck are otherwise unremarkable. Salivary glands are within normal limits. Thyroid  is normal. No significant adenopathy is present. No focal mucosal or submucosal lesions are present.  Upper chest: The lung apices are clear. The thoracic inlet is within normal limits. Review of the MIP images confirms the above findings CTA HEAD FINDINGS Anterior circulation: Minimal atherosclerotic calcifications are present within the cavernous internal carotid arteries bilaterally. No significant stenosis is present through the ICA termini. The A1 and M1 segments are normal. The anterior communicating artery is patent. The MCA and ACA branch vessels are within normal limits. No aneurysm is present. Posterior circulation: The PICA origins are visualized and normal. Ectasias of the more distal right vertebral artery and the basilar artery is again noted. Calcifications are present without focal stenosis. Vertebrobasilar junction is normal. Both posterior scratched at the superior cerebellar arteries are patent bilaterally. The posterior cerebral arteries originate from the basilar tip bilaterally. No aneurysm is present. Venous sinuses: The dural sinuses are patent. The straight sinus and deep cerebral veins are intact. Cortical veins are within normal limits. No significant vascular malformation is evident. Anatomic variants: None Review of the MIP images confirms the above findings IMPRESSION: 1. No acute intracranial abnormality or significant interval  change. 2. 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 3. Mild generalized atrophy and white matter disease likely reflects the sequela of chronic microvascular ischemia. 4. No hemodynamically significant or correctable stenosis in the neck. 5. No large vessel occlusion or significant proximal stenosis within the Circle of Willis. 6. Ectasia of the more distal right vertebral artery and the basilar artery is again noted. 7. Multilevel degenerative changes in the cervical spine. Electronically Signed   By: Lonni Necessary M.D.   On: 01/26/2024 18:59    Procedures Procedures    Medications Ordered in ED Medications  sodium chloride  0.9 % bolus 500 mL (0 mLs Intravenous Stopped 01/26/24 1825)  iohexol  (OMNIPAQUE ) 350 MG/ML injection 75 mL (75 mLs Intravenous Contrast Given 01/26/24 1819)    ED Course/ Medical Decision Making/ A&P Clinical Course as of 01/26/24 2350  Sat Jan 26, 2024  1958 On reassessment wife feels like his repetitive questioning is improved.  He still feels somewhat more off balance and is more unsteady when I try to walk him than normal.  I think he needs workup for TIA/stroke with MRI and likely admission given continued difficulty with gait.  Hospitalist paged. [JD]    Clinical Course User Index [JD] Nicholaus Cassondra DEL, MD                                 Medical Decision Making Amount and/or Complexity of Data Reviewed Labs: ordered. Radiology: ordered.  Risk Prescription drug management. Decision regarding hospitalization.   Medical Decision Making:   James Traynham. is a 78 y.o. male who presented to the ED today with repetitive questioning, confusion, dizziness.  Patient is here with his wife.  Patient states he was last well sometime yesterday afternoon when he started to have slight worsening of his chronic gait issues.  He felt disoriented around 8:30 AM today, and wife has noticed repetitive questioning since around 1030.  No changes since then.  He  is outside of the window for TNK based on his last known well with symptoms started yesterday even with his repetitive questioning being noticed around 10:30 AM today.  My exam he is somewhat off balance which is slightly worse than chronic but he has no other deficits.  He has no visual field cuts or visual changes, aphasia, neglect and no weakness he does not  meet LVO criteria.  Will obtain lab workup, CTA and reassess.  Patient placed on continuous vitals and telemetry monitoring while in ED which was reviewed periodically.  Reviewed and confirmed nursing documentation for past medical history, family history, social history.  Reassessment and Plan:   CTA without acute findings or LVO.  His lab work is notable for baseline renal function.  No leukocytosis.  On reassessment he is doing better.  Wife states his repetitive questioning has resolved and he is still awake and alert.  When I walked him he still somewhat more off balance than normal although significantly improved.  I think he would benefit from TA/stroke workup with MRI and further observation.  I spoke with the hospitalist who accepted him for admission.  Patient and wife are updated on plan and agreeable.   Patient's presentation is most consistent with acute complicated illness / injury requiring diagnostic workup.           Final Clinical Impression(s) / ED Diagnoses Final diagnoses:  Altered mental status, unspecified altered mental status type    Rx / DC Orders ED Discharge Orders     None         Nicholaus Cassondra DEL, MD 01/26/24 2350

## 2024-01-26 NOTE — ED Triage Notes (Signed)
 Pt's wife reports pt asking repeat questions and is confused since about 11am; pt is A & O in triage, denies pain or dizziness

## 2024-01-27 ENCOUNTER — Observation Stay (HOSPITAL_COMMUNITY): Payer: Medicare Other

## 2024-01-27 ENCOUNTER — Encounter (HOSPITAL_COMMUNITY): Payer: Self-pay | Admitting: Family Medicine

## 2024-01-27 DIAGNOSIS — Z79899 Other long term (current) drug therapy: Secondary | ICD-10-CM | POA: Diagnosis not present

## 2024-01-27 DIAGNOSIS — Z7984 Long term (current) use of oral hypoglycemic drugs: Secondary | ICD-10-CM | POA: Diagnosis not present

## 2024-01-27 DIAGNOSIS — Z7985 Long-term (current) use of injectable non-insulin antidiabetic drugs: Secondary | ICD-10-CM | POA: Diagnosis not present

## 2024-01-27 DIAGNOSIS — Z856 Personal history of leukemia: Secondary | ICD-10-CM | POA: Diagnosis not present

## 2024-01-27 DIAGNOSIS — E119 Type 2 diabetes mellitus without complications: Secondary | ICD-10-CM | POA: Diagnosis not present

## 2024-01-27 DIAGNOSIS — Z96611 Presence of right artificial shoulder joint: Secondary | ICD-10-CM | POA: Diagnosis not present

## 2024-01-27 DIAGNOSIS — Z87891 Personal history of nicotine dependence: Secondary | ICD-10-CM | POA: Diagnosis not present

## 2024-01-27 DIAGNOSIS — I10 Essential (primary) hypertension: Secondary | ICD-10-CM | POA: Diagnosis not present

## 2024-01-27 DIAGNOSIS — R4182 Altered mental status, unspecified: Secondary | ICD-10-CM | POA: Diagnosis present

## 2024-01-27 DIAGNOSIS — Z7901 Long term (current) use of anticoagulants: Secondary | ICD-10-CM | POA: Diagnosis not present

## 2024-01-27 DIAGNOSIS — F419 Anxiety disorder, unspecified: Secondary | ICD-10-CM

## 2024-01-27 DIAGNOSIS — K219 Gastro-esophageal reflux disease without esophagitis: Secondary | ICD-10-CM | POA: Diagnosis not present

## 2024-01-27 DIAGNOSIS — E785 Hyperlipidemia, unspecified: Secondary | ICD-10-CM | POA: Diagnosis not present

## 2024-01-27 DIAGNOSIS — R351 Nocturia: Secondary | ICD-10-CM | POA: Diagnosis not present

## 2024-01-27 DIAGNOSIS — G9340 Encephalopathy, unspecified: Secondary | ICD-10-CM | POA: Diagnosis not present

## 2024-01-27 DIAGNOSIS — Z96651 Presence of right artificial knee joint: Secondary | ICD-10-CM | POA: Diagnosis not present

## 2024-01-27 DIAGNOSIS — D45 Polycythemia vera: Secondary | ICD-10-CM | POA: Diagnosis not present

## 2024-01-27 DIAGNOSIS — F32A Depression, unspecified: Secondary | ICD-10-CM

## 2024-01-27 LAB — GLUCOSE, CAPILLARY
Glucose-Capillary: 172 mg/dL — ABNORMAL HIGH (ref 70–99)
Glucose-Capillary: 85 mg/dL (ref 70–99)

## 2024-01-27 LAB — HEMOGLOBIN A1C
Hgb A1c MFr Bld: 6.6 % — ABNORMAL HIGH (ref 4.8–5.6)
Mean Plasma Glucose: 142.72 mg/dL

## 2024-01-27 LAB — TSH: TSH: 1.901 u[IU]/mL (ref 0.350–4.500)

## 2024-01-27 MED ORDER — RIVAROXABAN 10 MG PO TABS
10.0000 mg | ORAL_TABLET | Freq: Every day | ORAL | Status: DC
  Administered 2024-01-27 – 2024-01-28 (×2): 10 mg via ORAL
  Filled 2024-01-27 (×3): qty 1

## 2024-01-27 MED ORDER — LOSARTAN POTASSIUM 50 MG PO TABS
100.0000 mg | ORAL_TABLET | Freq: Every day | ORAL | Status: DC
  Administered 2024-01-27: 100 mg via ORAL
  Filled 2024-01-27: qty 4

## 2024-01-27 MED ORDER — DASATINIB 70 MG PO TABS: 70.0000 mg | ORAL_TABLET | Freq: Every day | ORAL | Status: DC

## 2024-01-27 MED ORDER — SODIUM CHLORIDE 0.9% FLUSH
3.0000 mL | Freq: Two times a day (BID) | INTRAVENOUS | Status: DC
  Administered 2024-01-27 – 2024-01-28 (×2): 3 mL via INTRAVENOUS

## 2024-01-27 MED ORDER — STROKE: EARLY STAGES OF RECOVERY BOOK
Freq: Once | Status: AC
  Filled 2024-01-27: qty 1

## 2024-01-27 MED ORDER — VITAMIN B-12 1000 MCG PO TABS
1000.0000 ug | ORAL_TABLET | Freq: Every day | ORAL | Status: DC
  Administered 2024-01-27 – 2024-01-28 (×2): 1000 ug via ORAL
  Filled 2024-01-27 (×2): qty 1

## 2024-01-27 MED ORDER — SODIUM CHLORIDE 0.9 % IV SOLN: 250.0000 mL | INTRAVENOUS | Status: AC | PRN

## 2024-01-27 MED ORDER — FLUTICASONE PROPIONATE 50 MCG/ACT NA SUSP: 1.0000 | NASAL | Status: DC | PRN

## 2024-01-27 MED ORDER — ONDANSETRON HCL 4 MG/2ML IJ SOLN: 4.0000 mg | Freq: Four times a day (QID) | INTRAMUSCULAR | Status: DC | PRN

## 2024-01-27 MED ORDER — DESMOPRESSIN ACETATE 0.1 MG PO TABS
400.0000 ug | ORAL_TABLET | Freq: Every day | ORAL | Status: DC
  Administered 2024-01-27: 400 ug via ORAL
  Filled 2024-01-27 (×2): qty 4

## 2024-01-27 MED ORDER — LORAZEPAM 0.5 MG PO TABS
0.5000 mg | ORAL_TABLET | Freq: Every evening | ORAL | Status: DC | PRN
  Filled 2024-01-27: qty 1

## 2024-01-27 MED ORDER — ROSUVASTATIN CALCIUM 20 MG PO TABS
20.0000 mg | ORAL_TABLET | Freq: Every day | ORAL | Status: DC
  Administered 2024-01-27: 20 mg via ORAL
  Filled 2024-01-27 (×2): qty 1

## 2024-01-27 MED ORDER — POLYETHYLENE GLYCOL 3350 17 G PO PACK: 17.0000 g | PACK | Freq: Every day | ORAL | Status: DC | PRN

## 2024-01-27 MED ORDER — LORAZEPAM 0.5 MG PO TABS: 0.5000 mg | ORAL_TABLET | Freq: Every day | ORAL | Status: DC

## 2024-01-27 MED ORDER — BUPROPION HCL ER (XL) 150 MG PO TB24
300.0000 mg | ORAL_TABLET | Freq: Every morning | ORAL | Status: DC
  Administered 2024-01-28: 300 mg via ORAL
  Filled 2024-01-27: qty 2

## 2024-01-27 MED ORDER — INSULIN ASPART 100 UNIT/ML IJ SOLN: 0.0000 [IU] | Freq: Every day | INTRAMUSCULAR | Status: DC

## 2024-01-27 MED ORDER — HYDROCHLOROTHIAZIDE 12.5 MG PO TABS
12.5000 mg | ORAL_TABLET | Freq: Every day | ORAL | Status: DC
  Filled 2024-01-27: qty 1

## 2024-01-27 MED ORDER — ONDANSETRON HCL 4 MG PO TABS: 4.0000 mg | ORAL_TABLET | Freq: Four times a day (QID) | ORAL | Status: DC | PRN

## 2024-01-27 MED ORDER — LOSARTAN POTASSIUM-HCTZ 100-12.5 MG PO TABS
1.0000 | ORAL_TABLET | Freq: Every day | ORAL | Status: DC
Start: 2024-01-27 — End: 2024-01-27

## 2024-01-27 MED ORDER — INSULIN ASPART 100 UNIT/ML IJ SOLN: 0.0000 [IU] | Freq: Three times a day (TID) | INTRAMUSCULAR | Status: DC

## 2024-01-27 MED ORDER — SODIUM CHLORIDE 0.9% FLUSH: 3.0000 mL | INTRAVENOUS | Status: DC | PRN

## 2024-01-27 NOTE — ED Notes (Signed)
 Patient tried to stand up and use bedside commode repeatedly. Very unsteady on feet. Attempted to climb out bottom of the bed and was assisted back to bed, call bed in reach.

## 2024-01-27 NOTE — ED Provider Notes (Addendum)
 Emergency Medicine Observation Re-evaluation Note  Dent Plantz. is a 78 y.o. male, seen on rounds today.  Pt initially presented to the ED for complaints of Altered Mental Status Currently, the patient is asleep in bed, no acute distress.  Physical Exam  BP (!) 153/97   Pulse 74   Temp 97.9 F (36.6 C)   Resp 20   Ht 5' 6 (1.676 m)   Wt 78 kg   SpO2 98%   BMI 27.75 kg/m  Physical Exam General: Asleep, no acute distress Cardiac: Regular rate Lungs: No increased work of breathing Psych: Calm, sleep  ED Course / MDM  EKG:EKG Interpretation Date/Time:  Saturday January 26 2024 16:51:15 EST Ventricular Rate:  75 PR Interval:  176 QRS Duration:  93 QT Interval:  404 QTC Calculation: 452 R Axis:   -49  Text Interpretation: Sinus rhythm Left anterior fascicular block Abnormal R-wave progression, late transition Confirmed by Davis, Jonathon [phone]) on 01/26/2024 5:47:02 PM  I have reviewed the labs performed to date as well as medications administered while in observation.  Recent changes in the last 24 hours include evaluated overnight with normal labs and CTA. Concern for possible TIA and patient was admitted to hospitalist overnight.  Plan  Current plan is for admission for TIA work up.   11:44 AM Patient still awaiting bed admission. Will perform MRI here to facilitate TIA work up pending his bed.   Kingsley, Calob Baskette K, DO 01/27/24 1145

## 2024-01-27 NOTE — Plan of Care (Signed)

## 2024-01-27 NOTE — ED Notes (Signed)
 Spoke with Margo in Bed Placement regarding having a bed assignment. No available bed at present

## 2024-01-27 NOTE — H&P (Addendum)
 History and Physical    Patient: James [name]. FMW:[id number] DOB: 1946-07-24 DOA: 01/26/2024 DOS: the patient was seen and examined on 01/27/2024 PCP: Yolande Toribio MATSU, MD  Patient coming from: Home-lives with wife Medical readiness/disposition: Anticipate patient will be ready for discharge back 01/28/24.  Patient will discharge back to home  Chief Complaint:  Chief Complaint  Patient presents with   Altered Mental Status   HPI: James [name]. is a 78 y.o. male with medical history significant of hypertension, type 2 diabetes mellitus, sleep apnea on CPAP, CML, polycythemia vera on Xarelto , oral hypertension in context NASH cirrhosis with splenomegaly, and nocturia on desmopressin .  Patient was in his usual state of health until the a.m. of 2/8.  Patient and the wife states that the patient got up before 10 AM and went to run errands.  He returned and went to the bathroom.  At the same time the wife went into the bathroom and had noticed the patient had dribbled urine on the floor.  He subsequently tried to explain where he had gone for his errands but was not making any sense according to the wife.  As a day progressed he seemed to have more more confusion.  At some point he tried to walk to the bed to lay back down but could barely walk seem to be off balance.  At this point wife decided that the patient needed to go to the emergency department.  In the process of family/first responders getting patient to the car he was very ataxic.  Patient was transported by EMS to [facility] ED. exam there was essentially unremarkable noting CT of the head was normal.  MRI pending.  Labs unremarkable.  By the time he was evaluated in the ED his neurological exam was within normal limits and he was oriented x 3.  When the EDP stood up the patient to monitor his ambulation he had a shuffling gait and reported feeling off balance.  Upon my evaluation of the patient he was alert and  oriented and while in the bed did not appear to have any focal neurological deficits based on my exam.  I did discuss what kind of symptoms he may or may not have been having while in the bathroom.  Asked him if he ever has any sensation of feeling nauseous or dizzy while passing urine or after urinating.  He reported he does sometimes feel dizzy while urinating.  He also has a history in the past of having dizziness if he gets up too quickly from a recumbent or seated position and he has taught himself to adjust to this.  He has never been prescribed TED hose or midodrine.   Review of Systems: As mentioned in the history of present illness. All other systems reviewed and are negative.   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)    History   Portal vein thrombosis    Rhinitis    Situational depression    Sleep apnea    uses CPAP every night   Tubular adenoma 12/10/2015   6 cecum polyps   Past Surgical History:  Procedure Laterality Date   CARDIAC CATHETERIZATION     greater 10 yrs ago, normal   CARPAL TUNNEL RELEASE     bilateral   COLONOSCOPY  06/2005   [facility] Dr Geroge   ESOPHAGOGASTRODUODENOSCOPY (EGD) WITH PROPOFOL  N/A 09/28/2016   Procedure: ESOPHAGOGASTRODUODENOSCOPY (EGD) WITH PROPOFOL ;  Surgeon: Lupita FORBES Commander, MD;  Location: [facility] ENDOSCOPY;  Service: Endoscopy;  Laterality: N/A;   IR GENERIC HISTORICAL  12/12/2016   IR US  GUIDE VASC  ACCESS RIGHT 12/12/2016 Rome Hall, MD [facility]   IR GENERIC HISTORICAL  12/12/2016   IR VENOGRAM HEPATIC W HEMODYNAMIC EVALUATION 12/12/2016 Rome Hall, MD [facility]   IR GENERIC HISTORICAL  12/12/2016   IR TRANSCATHETER BX 12/12/2016 Rome Hall, MD [facility]   REVERSE SHOULDER ARTHROPLASTY Right 09/01/2021   Procedure: REVERSE SHOULDER ARTHROPLASTY;  Surgeon: Melita Drivers, MD;  Location: [facility];  Service: Orthopedics;  Laterality: Right;  ,   ROTATOR CUFF REPAIR     right   TENDON REPAIR     left arm   TONSILLECTOMY     TOTAL KNEE ARTHROPLASTY Right 2010   WISDOM TOOTH EXTRACTION     Social History:  reports that he quit smoking about 50 years ago. His smoking use included pipe. He has never used smokeless tobacco. He reports current alcohol use. He reports that he does not use drugs.  Allergies  Allergen Reactions   Ibuprofen Anaphylaxis and Other (See Comments)    Upper GI Bled   Nsaids Other (See Comments)    GI Bleed    Family History  Problem Relation Age of Onset   Diabetes Mother        history   Stroke Mother        history   Lung disease Father        history   Bipolar disorder Son        committed suicide   Colon cancer Other    Heart failure Other    Heart attack Other    Colon polyps Neg Hx    Rectal cancer Neg Hx    Stomach cancer Neg Hx    Esophageal cancer Neg Hx    Sleep apnea Neg Hx     Prior to Admission medications   Medication Sig Start Date End Date Taking? Authorizing Provider  amLODipine  (NORVASC ) 5 MG tablet Take 5 mg by mouth daily. Patient not taking: Reported on 01/24/2024 08/18/21   [provider]  buPROPion  (WELLBUTRIN  XL) 300 MG 24 hr tablet Take 300 mg by mouth every morning. 07/20/20   [provider]  dasatinib  (SPRYCEL ) 70 MG tablet Take 1 tablet (70 mg total) by mouth daily. 12/26/23   Timmy Maude SAUNDERS, MD  desmopressin  (DDAVP ) 0.2 MG tablet Take 400 mcg by mouth at bedtime. 12/28/21   [provider]  Dulaglutide  (TRULICITY ) 1.5 MG/0.5ML SOPN once a week. Patient not taking: Reported on 01/24/2024 05/15/23   [provider]  fluticasone  (FLONASE ) 50 MCG/ACT nasal spray Place 1 spray into both nostrils as needed for allergies. 10/23/19   [provider]  glucose blood test strip OneTouch Ultra Blue Test Strip  USE TO  TEST BLOOD SUGAR ONCE DAILY    [provider]  hydrOXYzine (VISTARIL) 25 MG capsule Take 25 mg by mouth at bedtime as needed. Patient not taking: Reported on 01/24/2024    [provider]  LORazepam  (ATIVAN ) 0.5 MG tablet Take 0.5 mg by mouth at bedtime. 11/01/18   [provider]  losartan -hydrochlorothiazide  (HYZAAR) 100-12.5 MG tablet Take 1 tablet by mouth daily. 01/23/22   [provider]  metFORMIN  (GLUCOPHAGE -XR) 500 MG 24 hr tablet Take 500 mg by mouth 2 (two) times daily. 11/08/20   [provider]  moxifloxacin  (VIGAMOX ) 0.5 % ophthalmic solution Place 1 drop into the right eye See admin instructions. Place 1 drop in right eye QID the day before, day of, and day after treatment. 02/15/18   [provider]  rosuvastatin  (CRESTOR ) 20 MG tablet Take 20 mg by mouth at bedtime. 12/18/22   [provider]  sildenafil (VIAGRA) 50 MG tablet Take 50 mg by mouth daily as needed. 10/26/14   [provider]  testosterone  cypionate (DEPOTESTOSTERONE CYPIONATE) 200 MG/ML injection Inject 200 mg into the muscle every 21 ( twenty-one) days.    [provider]  valsartan -hydrochlorothiazide  (DIOVAN -HCT) 320-25 MG tablet Take 1 tablet by mouth daily.    [provider]  vitamin B-12 (CYANOCOBALAMIN ) 1000 MCG tablet Take 1,000 mcg by mouth daily.    [provider]  XARELTO  10 MG TABS tablet TAKE 1 TABLET BY MOUTH EVERY DAY 09/21/23   Timmy Maude SAUNDERS, MD    Physical Exam: Vitals:   01/27/24 1200 01/27/24 1300 01/27/24 1324 01/27/24 1514  BP: (!) 168/97 (!) 162/89   138/88  Pulse: 76 82  90  Resp: 16 16  18   Temp:   98.4 F (36.9 C) 98.4 F (36.9 C)  TempSrc:   Oral Oral  SpO2: 99% 98%  100%  Weight:      Height:       Constitutional: NAD, calm, comfortable Respiratory: clear to auscultation bilaterally, no wheezing, no crackles. Normal respiratory effort. No accessory muscle use. RA Cardiovascular: Regular rate and rhythm, no murmurs / rubs / gallops. No extremity edema. 2+ pedal pulses. No carotid bruits.  Abdomen: no tenderness, no masses palpated.  Bowel sounds positive.  Musculoskeletal: no clubbing / cyanosis. No joint deformity upper and lower extremities. Good ROM, no contractures. Normal muscle tone.  Skin: no rashes, lesions, ulcers. No induration Neurologic: CN 2-12 grossly intact. Sensation intact,Strength 5/5 x all 4 extremities.  Psychiatric: Normal judgment and insight. Alert and oriented x 3. Normal mood.     Data Reviewed:  Sodium 139, potassium 3.8, CO2 23, BUN 22, creatinine 1.6, ionized calcium  1.44, LFTs within normal limits, GFR 49, WBC 6000 with normal differential, hemoglobin 14.4, platelets 238,000.  Urinalysis unremarkable -ammonia level within normal limits.  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  Assessment and Plan: Acute altered mentation Differential includes TIA vs transient changes from orthostasis vs possible micturition presyncope vs possible diabetic autonomic dysfunction Hold diuretics and anti HTN meds MRI pending Check echocardiogram Confirmed with patient and family no concerning focal neurological deficits such as facial drooping, drooling, visual changes, focal weakness or numbness.  No apparent indication to proceed with carotid duplex but will discuss with attending Due to concerns that transient orthostasis could be contributing to symptoms we will check orthostatic vital signs every  shift. PT evaluation and will ask them to also perform  a prolonged orthostatic vital sign evaluation OT evaluation  Hypertension Current blood pressure is controlled Based on review of prior to admission medications the dispense record demonstrates that patient apparently is no longer taking Cozaar  and HCTZ plus given concerns over possible orthostatic hypotension would hold HCTZ component regardless. Continue to follow blood pressure  Diabetes mellitus 2 Again does not appear to be taking metformin  based on pharmacy dispense record Follow CBGs and provide SSI  Sleep apnea on CPAP Continue on home settings  Chronic nocturia Continue DDAVP   Polycythemia vera/CML Followed by Dr. Timmy JAK2 negative  NASH cirrhosis with splenomegaly Ammonia level was normal LFTs normal On this Sprycel  70 mg/day per heme-onco note on February 6 Phlebotomy if indicated to keep hematocrit less than 45% IV iron if needed  Dyslipidemia Continue Crestor     Advance Care Planning:   Code Status: Do not attempt resuscitation (DNR) PRE-ARREST INTERVENTIONS DESIRED   VTE prophylaxis: Xarelto   Consults: None  Family Communication: Wife at bedside  Severity of Illness: The appropriate patient status for this patient is OBSERVATION. Observation status is judged to be reasonable and necessary in order to provide the required intensity of service to ensure the patient's safety. The patient's presenting symptoms, physical exam findings, and initial radiographic and laboratory data in the context of their medical condition is felt to place them at decreased risk for further clinical deterioration. Furthermore, it is anticipated that the patient will be medically stable for discharge from the hospital within 2 midnights of admission.   Author: Isaiah Lever, NP 01/27/2024 4:31 PM  For on call review [email or web address].

## 2024-01-27 NOTE — Hospital Course (Addendum)
 Patient is a 78 years old male with past medical history of anxiety, CML, depression, diabetes mellitus,, GERD, hypertension, polycythemia vera and portal vein thrombosis, obstructive sleep apnea  presented to hospital with increased gait difficulty with confusion and dizziness.  Patient had been forgetting things and repeating the same thing as per the family.  Patient has been having some difficulty with walking and shuffling gait for around 1 year.  On this presentation he was however noted to be more confused and disoriented with increasing dizziness.  In the ED, patient was afebrile.  Initial labs showed mild hyponatremia with sodium of 134 with creatinine elevation at 1.4.  Creatinine was elevated at 1.6.  EKG showed normal sinus rhythm.  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 In the ED patient received a normal saline bolus patient was then considered for admission to hospital for TIA/stroke  Assessment and plan.  Dizziness off balance gait imbalance.   With confusion disorientation.  Has improved at this time.  CTA of the head and neck without acute findings or large vessel occlusion.  Will check MRI of the brain.  Put TIA protocol.  Delirium precautions.  Check lipid profile, hemoglobin A1c.  Check 2D echocardiogram telemetry monitoring.  History of CML.  Follows up with Dr. Timmy oncology as outpatient.  On Dasatinib  as outpatient.  Will continue  Diabetes mellitus type 2 On Trulicity  as outpatient including metformin .  Will continue sliding scale insulin  while in the hospital  Hypertension On losartan  and HCTZ.  Will continue.  Was on amlodipine  but not taking.  Polycythemia vera with portal vein thrombosis On Xarelto  will continue.  History of hyperlipidemia.  On Crestor  we will continue.  Obstructive sleep apnea.  Anxiety/depression on bupropion  and lorazepam .   Will resume bupropion . Resume Ativan  at bedtime.

## 2024-01-28 ENCOUNTER — Other Ambulatory Visit (HOSPITAL_COMMUNITY): Payer: Medicare Other

## 2024-01-28 ENCOUNTER — Observation Stay (HOSPITAL_COMMUNITY): Payer: Medicare Other

## 2024-01-28 DIAGNOSIS — G9340 Encephalopathy, unspecified: Secondary | ICD-10-CM | POA: Diagnosis not present

## 2024-01-28 DIAGNOSIS — R4182 Altered mental status, unspecified: Secondary | ICD-10-CM

## 2024-01-28 DIAGNOSIS — I10 Essential (primary) hypertension: Secondary | ICD-10-CM

## 2024-01-28 DIAGNOSIS — R569 Unspecified convulsions: Secondary | ICD-10-CM | POA: Diagnosis not present

## 2024-01-28 LAB — BASIC METABOLIC PANEL
Anion gap: 19 — ABNORMAL HIGH (ref 5–15)
BUN: 11 mg/dL (ref 8–23)
CO2: 21 mmol/L — ABNORMAL LOW (ref 22–32)
Calcium: 11.5 mg/dL — ABNORMAL HIGH (ref 8.9–10.3)
Chloride: 99 mmol/L (ref 98–111)
Creatinine, Ser: 1.34 mg/dL — ABNORMAL HIGH (ref 0.61–1.24)
GFR, Estimated: 54 mL/min — ABNORMAL LOW (ref >60)
Glucose, Bld: 98 mg/dL (ref 70–99)
Potassium: 3.8 mmol/L (ref 3.5–5.1)
Sodium: 139 mmol/L (ref 135–145)

## 2024-01-28 LAB — ECHOCARDIOGRAM COMPLETE
Area-P 1/2: 4.68 cm2
Height: 66 in
S' Lateral: 2.9 cm
Weight: 2723.12 [oz_av]

## 2024-01-28 LAB — LIPID PANEL
Cholesterol: 78 mg/dL (ref 0–200)
HDL: 32 mg/dL — ABNORMAL LOW (ref >40)
LDL Cholesterol: 31 mg/dL (ref 0–99)
Total CHOL/HDL Ratio: 2.4 {ratio}
Triglycerides: 74 mg/dL (ref <150)
VLDL: 15 mg/dL (ref 0–40)

## 2024-01-28 LAB — CBC
HCT: 44.1 % (ref 39.0–52.0)
Hemoglobin: 14.3 g/dL (ref 13.0–17.0)
MCH: 29.6 pg (ref 26.0–34.0)
MCHC: 32.4 g/dL (ref 30.0–36.0)
MCV: 91.3 fL (ref 80.0–100.0)
Platelets: 213 10*3/uL (ref 150–400)
RBC: 4.83 MIL/uL (ref 4.22–5.81)
RDW: 19.9 % — ABNORMAL HIGH (ref 11.5–15.5)
WBC: 5.5 10*3/uL (ref 4.0–10.5)
nRBC: 0 % (ref 0.0–0.2)

## 2024-01-28 LAB — GLUCOSE, CAPILLARY
Glucose-Capillary: 119 mg/dL — ABNORMAL HIGH (ref 70–99)
Glucose-Capillary: 86 mg/dL (ref 70–99)
Glucose-Capillary: 174 mg/dL — ABNORMAL HIGH (ref 70–99)

## 2024-01-28 NOTE — Progress Notes (Signed)
 EEG complete - results pending

## 2024-01-28 NOTE — Evaluation (Signed)
 Physical Therapy Evaluation Patient Details Name: James [name]. MRN: [id number] DOB: 02/01/1946 Today's Date: 01/28/2024  History of Present Illness  Pt is a 78 yo male admitted with sudden onset confusion after running some errands while driving. CT and MRI -. Questionable TIA vs micturition syncope? PMH: DMII, HTN, sleep apnea, CML, polycythemia, splenomegaly, NASH cirrhosis.  Clinical Impression  Pt presents with admitting diagnosis above. Pt today was able ambulate in hallway and navigate steps with CGA no AD. Pt most limited by impulsivity, mild cognitive deficits and decreased balance. Pt reports that he falls at least once a week and per pt son pt wife mobility status is worse than his currently with her scheduled for neurosurgery on Friday. If pt were to discharge today he would need 24/7 supervision due to impulsivity. Recommend HHPT upon DC with transition to OPPT once family can arrange transportation. Pt anticipates DC home today however if still admitted pt would benefit from higher level balance activities and a speech eval due pt reported word finding difficulties.        If plan is discharge home, recommend the following: A little help with walking and/or transfers;A little help with bathing/dressing/bathroom;Assistance with cooking/housework;Direct supervision/assist for medications management;Assist for transportation;Help with stairs or ramp for entrance;Supervision due to cognitive status   Can travel by private vehicle        Equipment Recommendations None recommended by PT  Recommendations for Other Services       Functional Status Assessment Patient has had a recent decline in their functional status and demonstrates the ability to make significant improvements in function in a reasonable and predictable amount of time.     Precautions / Restrictions Precautions Precautions: Fall Precaution Comments: Pt falls once a week Restrictions Weight Bearing Restrictions  Per Provider Order: No      Mobility  Bed Mobility               General bed mobility comments: Pt up in chair    Transfers Overall transfer level: Needs assistance Equipment used: None Transfers: Sit to/from Stand Sit to Stand: Supervision           General transfer comment: Pt requires cues for safety.    Ambulation/Gait Ambulation/Gait assistance: Contact guard assist Gait Distance (Feet): 300 Feet Assistive device: None Gait Pattern/deviations: Narrow base of support, Drifts right/left, Decreased stride length, Step-through pattern Gait velocity: decreased     General Gait Details: Pt was rather unsteady however reports this is baseline. Pt noted with very narrow gait pattern and  with occasional scissoring. Pt reports this is his baseline after knee surgery.  Stairs Stairs: Yes Stairs assistance: Contact guard assist Stair Management: Two rails, Step to pattern, Forwards Number of Stairs: 4 General stair comments: no LOB noted. Cues for current sequencing.  Wheelchair Mobility     Tilt Bed    Modified Rankin (Stroke Patients Only)       Balance Overall balance assessment: Mild deficits observed, not formally tested                                           Pertinent Vitals/Pain Pain Assessment Pain Assessment: No/denies pain    Home Living Family/patient expects to be discharged to:: Private residence Living Arrangements: Spouse/significant other Available Help at Discharge: Available PRN/intermittently Type of Home: House Home Access: Stairs to enter Entrance Stairs-Rails: Doctor, general practice of  Steps: 3 Alternate Level Stairs-Number of Steps: 13 Home Layout: Two level Home Equipment: Agricultural consultant (2 wheels);Shower seat      Prior Function Prior Level of Function : Driving;Independent/Modified Independent;History of Falls (last six months)             Mobility Comments: walks with walker some  of time but not always. Falls, per report about once a week ADLs Comments: Completes basic adls without assist but occasionally falls.Wife cannot assist due to back issues.     Extremity/Trunk Assessment   Upper Extremity Assessment Upper Extremity Assessment: Overall WFL for tasks assessed    Lower Extremity Assessment Lower Extremity Assessment: Overall WFL for tasks assessed    Cervical / Trunk Assessment Cervical / Trunk Assessment: Normal  Communication   Communication Communication: Difficulty communicating thoughts/reduced clarity of speech;Hearing impairment;Other (comment) (pt with word finding deficits) Cueing Techniques: Verbal cues;Tactile cues  Cognition Arousal: Alert Behavior During Therapy: Impulsive Overall Cognitive Status: Impaired/Different from baseline Area of Impairment: Attention, Memory, Safety/judgement, Awareness                   Current Attention Level: Selective Memory: Decreased short-term memory   Safety/Judgement: Decreased awareness of safety, Decreased awareness of deficits Awareness: Emergent   General Comments: Pt has some baseline cognitive deficits per son but does not appear to be at baseline yet. Pt very quick to move, unsafe at times with use of walker and is a fall risk.  Pt would benefit from cognitive evaluation to further look at safety with driving.        General Comments General comments (skin integrity, edema, etc.): Pt most limited by impulsivity, mild cognitive deficits and decreased balance.    Exercises     Assessment/Plan    PT Assessment Patient needs continued PT services  PT Problem List Decreased strength;Decreased range of motion;Decreased balance;Decreased activity tolerance;Decreased mobility;Decreased coordination;Decreased cognition;Decreased knowledge of use of DME;Decreased safety awareness;Decreased knowledge of precautions;Cardiopulmonary status limiting activity       PT Treatment  Interventions DME instruction;Gait training;Stair training;Functional mobility training;Therapeutic activities;Therapeutic exercise;Balance training;Neuromuscular re-education;Cognitive remediation;Patient/family education    PT Goals (Current goals can be found in the Care Plan section)  Acute Rehab PT Goals Patient Stated Goal: to go home PT Goal Formulation: With patient/family Time For Goal Achievement: 02/11/24 Potential to Achieve Goals: Fair    Frequency Min 1X/week     Co-evaluation               AM-PAC PT "6 Clicks" Mobility  Outcome Measure Help needed turning from your back to your side while in a flat bed without using bedrails?: None Help needed moving from lying on your back to sitting on the side of a flat bed without using bedrails?: None Help needed moving to and from a bed to a chair (including a wheelchair)?: A Little Help needed standing up from a chair using your arms (e.g., wheelchair or bedside chair)?: A Little Help needed to walk in hospital room?: A Little Help needed climbing 3-5 steps with a railing? : A Little 6 Click Score: 20    End of Session Equipment Utilized During Treatment: Gait belt Activity Tolerance: Patient tolerated treatment well Patient left: in chair;with call bell/phone within reach;with family/visitor present Nurse Communication: Mobility status PT Visit Diagnosis: Other abnormalities of gait and mobility (R26.89)    Time: 9147-8295 PT Time Calculation (min) (ACUTE ONLY): 14 min   Charges:   PT Evaluation $PT Eval Moderate Complexity: 1 Mod  PT General Charges $$ ACUTE PT VISIT: 1 Visit         Rodgers Clack, PT, DPT Acute Rehab Services [phone]   Kaysey Berndt 01/28/2024, 10:47 AM

## 2024-01-28 NOTE — Care Management Obs Status (Signed)
 MEDICARE OBSERVATION STATUS NOTIFICATION   Patient Details  Name: James [name]. MRN: [id number] Date of Birth: 07-16-1946   Medicare Observation Status Notification Given:  Yes    Jannine Meo, RN 01/28/2024, 12:26 PM

## 2024-01-28 NOTE — TOC Initial Note (Addendum)
 Transition of Care (TOC) - Initial/Assessment Note    Patient Details  Name: James [name]. MRN: [id number] Date of Birth: 1946/04/21  Transition of Care [facility]) CM/SW Contact:    Jannine Meo, RN Phone Number: 01/28/2024, 1:08 PM  Clinical Narrative:                 Patient from home with c/o confusion. Pt lives with wife. 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 Patient evaluated by PT/OT with home health recommendations. Patient has used [facility] in the past and is open to returning there for [facility] services at discharge. Referral placed to [facility] with [facility] and is able to accept. Contact information placed on AVS. Will need HH orders.  Expected Discharge Plan: Home w Home Health Services Barriers to Discharge: Continued Medical Work up   Patient Goals and CMS Choice            Expected Discharge Plan and Services       Living arrangements for the past 2 months: Single Family Home                           HH Arranged: PT, OT HH Agency: [facility] Date [facility] Agency Contacted: 01/28/24 Time HH Agency Contacted: 1303 Representative spoke with at [facility] Agency: Thurston Flow  Prior Living Arrangements/Services Living arrangements for the past 2 months: Single Family Home Lives with:: Spouse Patient language and need for interpreter reviewed:: Yes Do you feel safe going back to the place where you live?: Yes      Need for Family Participation in Patient Care: No (Comment) Care giver support system in place?: Yes (comment) Current home services: DME Criminal Activity/Legal Involvement Pertinent to Current Situation/Hospitalization: No - Comment as needed  Activities of Daily Living      Permission Sought/Granted Permission sought to share information with : Facility Industrial/product designer granted to share information with : Yes, Verbal Permission Granted              Emotional Assessment Appearance:: Appears stated  age Attitude/Demeanor/Rapport: Engaged Affect (typically observed): Appropriate, Pleasant Orientation: : Oriented to Self, Oriented to Place, Oriented to Situation Alcohol / Substance Use: Not Applicable Psych Involvement: No (comment)  Admission diagnosis:  Acute encephalopathy [G93.40] Altered mental status, unspecified altered mental status type [R41.82] Patient Active Problem List   Diagnosis Date Noted   Anxiety    Depression    GERD (gastroesophageal reflux disease)    DDD (degenerative disc disease), cervical 01/30/2023   Pre-syncope 01/30/2023   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 01/30/2023   CPAP use counseling 01/30/2023   Acute metabolic encephalopathy 09/03/2021   Acute encephalopathy 09/02/2021   Type 2 diabetes mellitus with hyperglycemia (HCC) 09/02/2021   SIRS (systemic inflammatory response syndrome) (HCC) 09/02/2021   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   OSA on CPAP 10/30/2017   Hypogonadism, male 10/30/2017   Iron deficiency anemia due to chronic blood loss 11/20/2016   Erosive gastritis with hemorrhage    Melena    Acute blood loss anemia 09/27/2016   Acute upper GI bleed 09/27/2016   Abnormal liver diagnostic imaging 06/22/2016   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/03/2016   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   Portal vein thrombosis 04/29/2016   DM (diabetes mellitus) (HCC) 04/29/2016   Leukocytosis 04/29/2016   Dyspnea 12/31/2015   HTN (hypertension) 12/31/2015  Hyperlipidemia 12/31/2015   Nocturia more than twice per night 11/11/2014   Severe obesity (BMI >= 40) (HCC) 11/11/2014   Hypersomnia with sleep apnea 11/11/2014   Polycythemia vera (HCC) 11/29/2011   PCP:  Bertha Broad, MD Pharmacy:   [facility] [address], [place] - [address] [place] [address] Phone: [phone] Fax: [phone]  [facility] - [place], [place] - [address] [place] [address] Phone: [phone] Fax: [phone]  [place] [facility] [place] [facility] [address] [place] [address] Phone: [phone] Fax: [phone]     Social Drivers of Health (SDOH) Social History: SDOH Screenings   Food Insecurity: No Food Insecurity (01/27/2024)  Housing: Low Risk  (01/27/2024)  Transportation Needs: No Transportation Needs (01/27/2024)  Utilities: Not At Risk (01/27/2024)  Depression (PHQ2-9): Medium Risk (05/25/2020)  Social Connections: Unknown (01/27/2024)  Tobacco Use: Medium Risk (01/26/2024)   SDOH Interventions:     Readmission Risk Interventions     No data to display

## 2024-01-28 NOTE — Discharge Instructions (Signed)
 Follow with Primary MD Bertha Broad, MD in 7 days   Get CBC, CMP,  checked  by Primary MD next visit.    Activity: As tolerated with Full fall precautions use walker/cane & assistance as needed   Disposition Home    Diet: Heart Healthy  On your next visit with your primary care physician please Get Medicines reviewed and adjusted.   Please request your Prim.MD to go over all Hospital Tests and Procedure/Radiological results at the follow up, please get all Hospital records sent to your Prim MD by signing hospital release before you go home.   If you experience worsening of your admission symptoms, develop shortness of breath, life threatening emergency, suicidal or homicidal thoughts you must seek medical attention immediately by calling 911 or calling your MD immediately  if symptoms less severe.  You Must read complete instructions/literature along with all the possible adverse reactions/side effects for all the Medicines you take and that have been prescribed to you. Take any new Medicines after you have completely understood and accpet all the possible adverse reactions/side effects.   Do not drive, operating heavy machinery, perform activities at heights, swimming or participation in water  activities or provide baby sitting services if your were admitted for syncope or siezures until you have seen by Primary MD or a Neurologist and advised to do so again.  Do not drive when taking Pain medications.    Do not take more than prescribed Pain, Sleep and Anxiety Medications  Special Instructions: If you have smoked or chewed Tobacco  in the last 2 yrs please stop smoking, stop any regular Alcohol  and or any Recreational drug use.  Wear Seat belts while driving.   Please note  You were cared for by a hospitalist during your hospital stay. If you have any questions about your discharge medications or the care you received while you were in the hospital after you are  discharged, you can call the unit and asked to speak with the hospitalist on call if the hospitalist that took care of you is not available. Once you are discharged, your primary care physician will handle any further medical issues. Please note that NO REFILLS for any discharge medications will be authorized once you are discharged, as it is imperative that you return to your primary care physician (or establish a relationship with a primary care physician if you do not have one) for your aftercare needs so that they can reassess your need for medications and monitor your lab values.

## 2024-01-28 NOTE — Discharge Summary (Signed)
 Physician Discharge Summary  James [name]. WGN:[id number] DOB: 1946/03/26 DOA: 01/26/2024  PCP: Bertha Broad, MD  Admit date: 01/26/2024 Discharge date: 01/28/2024  Admitted From: (Home) Disposition:  (Home )  Recommendations for Outpatient Follow-up:  Follow up with PCP in 1-2 weeks Please obtain BMP/CBC in one week   Home Health: (YES)  Diet recommendation: Heart Healthy   Brief/Interim Summary:  James Kamstra. is a 78 y.o. 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  -Patient presents to ED secondary to complaints of altered mental status, dizziness, unsteady gait, upon questioning the patient, he reports he has been having the symptoms ongoing for last 3 years, intermittently, but he was more confused this time which prompted his wife to bring him to the hospital   Acute encephalopathy Dizziness Unsteady gait -Patient reports ongoing symptoms over last 3 years, they present intermittently, mainly with activity, but as well presents spontaneously, reports progressive weakness, deconditioning, as well increased forgetfulness, but last episode was accompanied by significantly increased confusion. -Given these recurrent transient episodes with sudden onset and total recovery,  did check EEG, study was WNL, No seizures or epileptiform discharges were seen throughout the recording.  -MRI brain with no acute finding. -CTA head and neck with no significant LVO -2 D echo with preserved EF, no RWMA. -PT/OT consulted, plan for HH -Continue to monitor on telemetry, he has brief episodes of arrhythmias, but no bradycardias, no malignant arrhythmias -Strong possibility patient that his CML treatment Dasatinib  contributing to this, as it is known to cause dizziness, stiffness  and unsteady gait, and start of symptoms coincided with the initiation of this therapy, please see discussion below under CML   Hypertension - Blood pressure is controlled, does not appear to be taking his Cozaar  and hydrochlorothiazide  anymore. -He is not orthostatic - BP is stable   Diabetes mellitus type II -resume home meds   Sleep apnea on CPAP Continue on home settings   Chronic nocturia Continue DDAVP    Polycythemia vera Followed by Dr. Maria [name] JAK2 negative Continue with Xarelto    CML -Followed by Dr. Maria [name], has been on Dasatinib  for last 3 years, upon most recent follow-up plan was to DC Dasatinib  as his BCR/ABL was negative last time, and he is doing quite well. -Please see above discussion as well regarding possibly that the setting up was contributing to his dizziness, unsteady gait and falls.   NASH cirrhosis with splenomegaly Ammonia level was normal LFTs normal On this Sprycel  70 mg/day per heme-onco note on February 6 Phlebotomy if indicated to keep hematocrit less than 45% IV iron if needed   Dyslipidemia Continue Crestor     Discharge Diagnoses:  Principal Problem:   Acute encephalopathy Active Problems:   HTN (hypertension)   DM (diabetes mellitus) (HCC)   Anxiety   Depression   GERD (gastroesophageal reflux disease)    Discharge Instructions  Discharge Instructions     Diet - low sodium heart healthy   Complete by: As directed    Discharge instructions   Complete by: As directed    Follow with Primary MD Bertha Broad, MD in 7 days   Get CBC, CMP,  checked  by Primary MD next visit.    Activity: As tolerated with Full fall precautions use walker/cane & assistance as needed   Disposition Home  Diet: Heart Healthy  On your next visit with your primary care physician please Get Medicines reviewed and adjusted.   Please request your Prim.MD to go over all Hospital Tests and Procedure/Radiological results at the follow up,  please get all Hospital records sent to your Prim MD by signing hospital release before you go home.   If you experience worsening of your admission symptoms, develop shortness of breath, life threatening emergency, suicidal or homicidal thoughts you must seek medical attention immediately by calling 911 or calling your MD immediately  if symptoms less severe.  You Must read complete instructions/literature along with all the possible adverse reactions/side effects for all the Medicines you take and that have been prescribed to you. Take any new Medicines after you have completely understood and accpet all the possible adverse reactions/side effects.   Do not drive, operating heavy machinery, perform activities at heights, swimming or participation in water  activities or provide baby sitting services if your were admitted for syncope or siezures until you have seen by Primary MD or a Neurologist and advised to do so again.  Do not drive when taking Pain medications.    Do not take more than prescribed Pain, Sleep and Anxiety Medications  Special Instructions: If you have smoked or chewed Tobacco  in the last 2 yrs please stop smoking, stop any regular Alcohol  and or any Recreational drug use.  Wear Seat belts while driving.   Please note  You were cared for by a hospitalist during your hospital stay. If you have any questions about your discharge medications or the care you received while you were in the hospital after you are discharged, you can call the unit and asked to speak with the hospitalist on call if the hospitalist that took care of you is not available. Once you are discharged, your primary care physician will handle any further medical issues. Please note that NO REFILLS for any discharge medications will be authorized once you are discharged, as it is imperative that you return to your primary care physician (or establish a relationship with a primary care physician if you do not  have one) for your aftercare needs so that they can reassess your need for medications and monitor your lab values.   Increase activity slowly   Complete by: As directed       Allergies as of 01/28/2024       Reactions   Ibuprofen Anaphylaxis, Other (See Comments)   Upper GI Bled   Nsaids Other (See Comments)   GI Bleed        Medication List     STOP taking these medications    dasatinib  70 MG tablet Commonly known as: Sprycel    hydrOXYzine 25 MG capsule Commonly known as: VISTARIL   LORazepam  0.5 MG tablet Commonly known as: ATIVAN    losartan -hydrochlorothiazide  100-12.5 MG tablet Commonly known as: HYZAAR   sildenafil 50 MG tablet Commonly known as: VIAGRA       TAKE these medications    buPROPion  300 MG 24 hr tablet Commonly known as: WELLBUTRIN  XL Take 300 mg by mouth every morning.   cyanocobalamin  1000 MCG tablet Commonly known as: VITAMIN B12 Take 1,000 mcg by mouth daily.   desmopressin  0.2 MG tablet Commonly known as: DDAVP  Take 400 mcg by mouth at bedtime.   fluticasone  50 MCG/ACT nasal spray Commonly known as: FLONASE  Place 1 spray into both nostrils as needed for allergies.   glucose blood test strip OneTouch Ultra Blue Test  Strip  USE TO TEST BLOOD SUGAR ONCE DAILY   metFORMIN  500 MG 24 hr tablet Commonly known as: GLUCOPHAGE -XR Take 500 mg by mouth 2 (two) times daily.   moxifloxacin  0.5 % ophthalmic solution Commonly known as: VIGAMOX  Place 1 drop into the right eye See admin instructions. Place 1 drop in right eye QID the day before, day of, and day after treatment.   rosuvastatin  20 MG tablet Commonly known as: CRESTOR  Take 20 mg by mouth at bedtime.   testosterone  cypionate 200 MG/ML injection Commonly known as: DEPOTESTOSTERONE CYPIONATE Inject 200 mg into the muscle every 21 ( twenty-one) days.   Xarelto  10 MG Tabs tablet Generic drug: rivaroxaban  TAKE 1 TABLET BY MOUTH EVERY DAY        Allergies  Allergen  Reactions   Ibuprofen Anaphylaxis and Other (See Comments)    Upper GI Bled   Nsaids Other (See Comments)    GI Bleed    Consultations: none   Procedures/Studies: MR Brain Wo Contrast (neuro protocol) Result Date: 01/27/2024 CLINICAL DATA:  Transient ischemic attack (TIA) EXAM: MRI HEAD WITHOUT CONTRAST TECHNIQUE: Multiplanar, multiecho pulse sequences of the brain and surrounding structures were obtained without intravenous contrast. COMPARISON:  CTA head/neck January 26, 2024. FINDINGS: Brain: No acute infarction, hemorrhage, hydrocephalus, extra-axial collection or mass lesion. Patchy T2/FLAIR hyperintensities the white matter, compatible chronic microvascular ischemic disease. Cerebral atrophy. Vascular: Major arterial flow voids are maintained at the skull base. Chronic vertebrobasilar dolichoectasia. Skull and upper cervical spine: Normal marrow signal. Sinuses/Orbits: Clear sinuses.  No acute orbital findings. Other: Small mastoid effusions. IMPRESSION: No acute abnormality. Electronically Signed   By: Stevenson Elbe M.D.   On: 01/27/2024 17:15   CT Angio Head Neck W WO CM Result Date: 01/26/2024 CLINICAL DATA:  Confusion beginning at 11 a.m. today. EXAM: CT ANGIOGRAPHY HEAD AND NECK WITH AND WITHOUT CONTRAST TECHNIQUE: Multidetector CT imaging of the head and neck was performed using the standard protocol during bolus administration of intravenous contrast. Multiplanar CT image reconstructions and MIPs were obtained to evaluate the vascular anatomy. Carotid stenosis measurements (when applicable) are obtained utilizing NASCET criteria, using the distal internal carotid diameter as the denominator. RADIATION DOSE REDUCTION: This exam was performed according to the departmental dose-optimization program which includes automated exposure control, adjustment of the mA and/or kV according to patient size and/or use of iterative reconstruction technique. CONTRAST:  75mL OMNIPAQUE  IOHEXOL  350 MG/ML  SOLN COMPARISON:  MR head without contrast 01/27/2023. CT head without contrast 01/26/2023. FINDINGS: CT HEAD FINDINGS Brain: Mild generalized atrophy and white matter disease scratched at moderate generalized atrophy and white matter disease is again noted. Deep brain nuclei are within normal limits. The ventricles are proportionate to the degree of atrophy. No significant extraaxial fluid collection is present. A remote infarct is present in the medial right occipital pole. The brainstem and cerebellum are within normal limits. Midline structures are within normal limits. Vascular: Atherosclerotic calcifications are present within the cavernous internal carotid arteries. No hyperdense vessel is present. Skull: Calvarium is intact. No focal lytic or blastic lesions are present. No significant extracranial soft tissue lesion is present. Sinuses/Orbits: The paranasal sinuses and mastoid air cells are clear. Bilateral lens replacements are noted. Globes and orbits are otherwise unremarkable. Review of the MIP images confirms the above findings CTA NECK FINDINGS Aortic arch: Atherosclerotic calcifications are present in the aortic arch and at the great vessel origins without focal stenosis. No aneurysm is present. No dissection is present. Right carotid system:  The right common carotid artery is within normal limits. Atherosclerotic calcifications are present. No significant stenosis is present. Mild tortuosity is present cervical right ICA without significant stenosis. Left carotid system: The left common carotid artery is within normal limits. Atherosclerotic changes are present at the bifurcation. No significant stenosis is present. Mild tortuosity is present cervical left ICA without significant stenosis. Vertebral arteries: Vertebral arteries are codominant. Both vertebral arteries originate from the subclavian arteries without significant stenosis. No significant stenosis is present in either vertebral artery in  the neck. Skeleton: Multilevel degenerative changes are present in the cervical spine. Slight anterolisthesis is present at C2-3 and C3-4. Grade 1 anterolisthesis at C4-5 measures 3 mm. Straightening of the normal cervical lordosis is present. Endplate sclerotic changes are present from C3-4 through C6-7. Vertebral body heights are normal. No focal osseous lesions are present. Other neck: Soft tissues the neck are otherwise unremarkable. Salivary glands are within normal limits. Thyroid  is normal. No significant adenopathy is present. No focal mucosal or submucosal lesions are present. Upper chest: The lung apices are clear. The thoracic inlet is within normal limits. Review of the MIP images confirms the above findings CTA HEAD FINDINGS Anterior circulation: Minimal atherosclerotic calcifications are present within the cavernous internal carotid arteries bilaterally. No significant stenosis is present through the ICA termini. The A1 and M1 segments are normal. The anterior communicating artery is patent. The MCA and ACA branch vessels are within normal limits. No aneurysm is present. Posterior circulation: The PICA origins are visualized and normal. Ectasias of the more distal right vertebral artery and the basilar artery is again noted. Calcifications are present without focal stenosis. Vertebrobasilar junction is normal. Both posterior scratched at the superior cerebellar arteries are patent bilaterally. The posterior cerebral arteries originate from the basilar tip bilaterally. No aneurysm is present. Venous sinuses: The dural sinuses are patent. The straight sinus and deep cerebral veins are intact. Cortical veins are within normal limits. No significant vascular malformation is evident. Anatomic variants: None Review of the MIP images confirms the above findings IMPRESSION: 1. No acute intracranial abnormality or significant interval change. 2. 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 3. Mild  generalized atrophy and white matter disease likely reflects the sequela of chronic microvascular ischemia. 4. No hemodynamically significant or correctable stenosis in the neck. 5. No large vessel occlusion or significant proximal stenosis within the Circle of Willis. 6. Ectasia of the more distal right vertebral artery and the basilar artery is again noted. 7. Multilevel degenerative changes in the cervical spine. Electronically Signed   By: Audree Leas M.D.   On: 01/26/2024 18:59   Subjective:  No significant events overnight, denies any complaints today,  Discharge Exam: Vitals:   01/28/24 0800 01/28/24 1236  BP: (!) 125/91 136/75  Pulse: 76 85  Resp: 18 15  Temp: 98.7 F (37.1 C)   SpO2: 95% 95%   Vitals:   01/28/24 0442 01/28/24 0701 01/28/24 0800 01/28/24 1236  BP: 130/85  (!) 125/91 136/75  Pulse: 71  76 85  Resp: 17  18 15   Temp: 98.1 F (36.7 C)  98.7 F (37.1 C)   TempSrc: Oral  Oral   SpO2: 91%  95% 95%  Weight:  77.2 kg    Height:        General: Pt is alert, awake, not in acute distress, frail, hard of hearing Cardiovascular: RRR, S1/S2 +, no rubs, no gallops Respiratory: CTA bilaterally, no wheezing, no rhonchi Abdominal: Soft, NT, ND,  bowel sounds + Extremities: no edema, no cyanosis    The results of significant diagnostics from this hospitalization (including imaging, microbiology, ancillary and laboratory) are listed below for reference.     Microbiology: No results found for this or any previous visit (from the past 240 hours).   Labs: BNP (last 3 results) No results for input(s): "BNP" in the last 8760 hours. Basic Metabolic Panel: Recent Labs  Lab 01/24/24 1157 01/26/24 1655 01/26/24 1702 01/28/24 0421  NA 139 134* 139 139  K 4.3 3.7 3.8 3.8  CL 106 105 103 99  CO2 25 23  --  21*  GLUCOSE 120* 85 81 98  BUN 14 22 22 11   CREATININE 1.48* 1.45* 1.60* 1.34*  CALCIUM  11.0* 10.5*  --  11.5*   Liver Function Tests: Recent Labs   Lab 01/24/24 1157 01/26/24 1655  AST 24 33  ALT 15 20  ALKPHOS 80 66  BILITOT 0.5 0.8  PROT 6.9 6.9  ALBUMIN 4.4 4.1   No results for input(s): "LIPASE", "AMYLASE" in the last 168 hours. No results for input(s): "AMMONIA" in the last 168 hours. CBC: Recent Labs  Lab 01/24/24 1157 01/26/24 1655 01/26/24 1702 01/28/24 0421  WBC 6.7 6.0  --  5.5  NEUTROABS 4.3 4.0  --   --   HGB 15.6 14.4 16.0 14.3  HCT 47.3 44.2 47.0 44.1  MCV 90.6 90.6  --  91.3  PLT 235 238  --  213   Cardiac Enzymes: No results for input(s): "CKTOTAL", "CKMB", "CKMBINDEX", "TROPONINI" in the last 168 hours. BNP: Invalid input(s): "POCBNP" CBG: Recent Labs  Lab 01/27/24 1604 01/27/24 2119 01/28/24 0840 01/28/24 1239  GLUCAP 172* 85 119* 86   D-Dimer No results for input(s): "DDIMER" in the last 72 hours. Hgb A1c Recent Labs    01/27/24 1802  HGBA1C 6.6*   Lipid Profile Recent Labs    01/28/24 0421  CHOL 78  HDL 32*  LDLCALC 31  TRIG 74  CHOLHDL 2.4   Thyroid  function studies Recent Labs    01/27/24 1802  TSH 1.901   Anemia work up No results for input(s): "VITAMINB12", "FOLATE", "FERRITIN", "TIBC", "IRON", "RETICCTPCT" in the last 72 hours. Urinalysis    Component Value Date/Time   COLORURINE YELLOW 01/26/2024 1923   APPEARANCEUR CLEAR 01/26/2024 1923   LABSPEC >=1.030 01/26/2024 1923   PHURINE 5.5 01/26/2024 1923   GLUCOSEU NEGATIVE 01/26/2024 1923   HGBUR NEGATIVE 01/26/2024 1923   BILIRUBINUR NEGATIVE 01/26/2024 1923   KETONESUR NEGATIVE 01/26/2024 1923   PROTEINUR NEGATIVE 01/26/2024 1923   UROBILINOGEN 1.0 04/24/2022 0917   NITRITE NEGATIVE 01/26/2024 1923   LEUKOCYTESUR NEGATIVE 01/26/2024 1923   Sepsis Labs Recent Labs  Lab 01/24/24 1157 01/26/24 1655 01/28/24 0421  WBC 6.7 6.0 5.5   Microbiology No results found for this or any previous visit (from the past 240 hours).   Time coordinating discharge: Over 30 minutes  SIGNED:   Seena Dadds,  MD  [facility] 01/28/2024, 2:35 PM Pager   If 7PM-7AM, please contact night-coverage [email or web address]

## 2024-01-28 NOTE — Procedures (Signed)
 Patient Name: James [name].  MRN: [id number]  Epilepsy Attending: Arleene Lack  Referring Physician/Provider: Epifanio Haste, MD  Date: 01/28/2024 Duration: 24.39 mins  Patient history: 78yo M with transient episodes of confusion. EEG to evaluate for seizure  Level of alertness: Awake  AEDs during EEG study: None  Technical aspects: This EEG study was done with scalp electrodes positioned according to the 10-20 International system of electrode placement. Electrical activity was reviewed with band pass filter of 1-70Hz , sensitivity of 7 uV/mm, display speed of 64mm/sec with a 60Hz  notched filter applied as appropriate. EEG data were recorded continuously and digitally stored.  Video monitoring was available and reviewed as appropriate.  Description: The posterior dominant rhythm consists of 8 Hz activity of moderate voltage (25-35 uV) seen predominantly in posterior head regions, symmetric and reactive to eye opening and eye closing. Hyperventilation and photic stimulation were not performed.     IMPRESSION: This study is within normal limits. No seizures or epileptiform discharges were seen throughout the recording.  A normal interictal EEG does not exclude the diagnosis of epilepsy.   [name] O Rodneshia Greenhouse

## 2024-01-28 NOTE — Progress Notes (Addendum)
 Prior-To-Admission Oral Chemotherapy for Treatment of Oncologic Disease   Order noted from Dr. Carlon Chester to continue prior-to-admission oral chemotherapy regimen of Dasatinib .  Procedure Per Pharmacy & Therapeutics Committee Policy: Orders for continuation of home oral chemotherapy for treatment of an oncologic disease will be held unless approved by an oncologist during current admission.    For patients receiving oncology care at [facility], inpatient pharmacist contacts patient's oncologist during regular office hours to review. If earlier review is medically necessary, attending physician consults [facility] on-call oncologist   For patients receiving oncology care outside of [facility], attending physician consults patient's oncologist to review. If this oncologist or their coverage cannot be reached, attending physician consults [facility] on-call oncologist   Oral chemotherapy is currently being held while inpatient per the prescribing provider, Dr. Maria Shiner. This medication should not be continued on discharge as he plans to hold this medication for 3 months.   James [name] 01/28/2024, 1:24 PM

## 2024-01-28 NOTE — Progress Notes (Signed)
 PROGRESS NOTE    James [name].  ZOX:[id number] DOB: 1945-12-19 DOA: 01/26/2024 PCP: Bertha Broad, MD   Chief Complaint  Patient presents with   Altered Mental Status    Brief Narrative:    James Zamorski. is a 78 y.o. 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  -Patient presents to ED secondary to complaints of altered mental status, dizziness, unsteady gait, upon questioning the patient, he reports he has been having the symptoms ongoing for last 3 years, intermittently, but he was more confused this time which prompted his wife to bring him to the hospital  Assessment & Plan:   Principal Problem:   Acute encephalopathy Active Problems:   HTN (hypertension)   DM (diabetes mellitus) (HCC)   Anxiety   Depression   GERD (gastroesophageal reflux disease)   Acute encephalopathy Dizziness Unsteady gait -Patient reports ongoing symptoms over last 3 years, they present intermittently, mainly with activity, but as well presents spontaneously, reports progressive weakness, deconditioning, as well increased forgetfulness, but last episode was accompanied by significantly increased confusion. -Given these recurrent transient episodes with sudden onset and total recovery, we will proceed with EEG to rule out seizures. -MRI brain with no acute finding. -CTA head and neck with no significant LVO -Will need to rule out presyncope as well, continue to monitor on telemetry and obtain 2D echo. -PT/OT consult -Continue to monitor on telemetry, he has brief episodes of arrhythmias, but no bradycardias, no malignant arrhythmias -Strong possibility patient that his CML treatment Dasatinib  contributing to this, as it is known to cause dizziness, stiffness and unsteady gait, and  start of symptoms coincided with the initiation of this therapy, please see discussion below under CML  Hypertension - Blood pressure is controlled, does not appear to be taking his Cozaar  and hydrochlorothiazide  anymore. -He is not orthostatic  Diabetes mellitus type II -hold oral agents and keep on insulin  sliding scale during hospital stay  Sleep apnea on CPAP Continue on home settings   Chronic nocturia Continue DDAVP    Polycythemia vera Followed by Dr. Maria Shiner JAK2 negative Continue with Xarelto   CML -Followed by Dr. Maria Shiner, has been on Dasatinib  for last 3 years, upon most recent follow-up plan was to DC Dasatinib  as his BCR/ABL was negative last time, and he is doing quite well. -Please see above discussion as well regarding possibly that the setting up was contributing to his dizziness, unsteady gait and falls.   NASH cirrhosis with splenomegaly Ammonia level was normal LFTs normal On this Sprycel  70 mg/day per heme-onco note on February 6 Phlebotomy if indicated to keep hematocrit less than 45% IV iron if needed   Dyslipidemia Continue Crestor          DVT prophylaxis: On Xarelto  Code Status: Full code Family Communication: None at bedside Disposition:   Status is: Observation    Consultants:  none   Subjective:  No significant events overnight, denies any complaints today,  Objective: Vitals:   01/28/24 0442 01/28/24 0701 01/28/24 0800 01/28/24 1236  BP: 130/85  (!) 125/91 136/75  Pulse: 71  76 85  Resp: 17  18 15   Temp: 98.1 F (36.7 C)  98.7 F (37.1 C)   TempSrc: Oral  Oral   SpO2: 91%  95% 95%  Weight:  77.2 kg    Height:  Intake/Output Summary (Last 24 hours) at 01/28/2024 1346 Last data filed at 01/28/2024 0701 Gross per 24 hour  Intake 3 ml  Output 500 ml  Net -497 ml   Filed Weights   01/26/24 1614 01/28/24 0701  Weight: 78 kg 77.2 kg    Examination:  Awake Alert, Oriented X 3, frail Symmetrical Chest wall  movement, Good air movement bilaterally, CTAB RRR,No Gallops,Rubs or new Murmurs, No Parasternal Heave +ve B.Sounds, Abd Soft, No tenderness, No rebound - guarding or rigidity. No Cyanosis, Clubbing or edema, No new Rash or bruise       Data Reviewed: I have personally reviewed following labs and imaging studies  CBC: Recent Labs  Lab 01/24/24 1157 01/26/24 1655 01/26/24 1702 01/28/24 0421  WBC 6.7 6.0  --  5.5  NEUTROABS 4.3 4.0  --   --   HGB 15.6 14.4 16.0 14.3  HCT 47.3 44.2 47.0 44.1  MCV 90.6 90.6  --  91.3  PLT 235 238  --  213    Basic Metabolic Panel: Recent Labs  Lab 01/24/24 1157 01/26/24 1655 01/26/24 1702 01/28/24 0421  NA 139 134* 139 139  K 4.3 3.7 3.8 3.8  CL 106 105 103 99  CO2 25 23  --  21*  GLUCOSE 120* 85 81 98  BUN 14 22 22 11   CREATININE 1.48* 1.45* 1.60* 1.34*  CALCIUM  11.0* 10.5*  --  11.5*    GFR: Estimated Creatinine Clearance: 44.5 mL/min (A) (by C-G formula based on SCr of 1.34 mg/dL (H)).  Liver Function Tests: Recent Labs  Lab 01/24/24 1157 01/26/24 1655  AST 24 33  ALT 15 20  ALKPHOS 80 66  BILITOT 0.5 0.8  PROT 6.9 6.9  ALBUMIN 4.4 4.1    CBG: Recent Labs  Lab 01/27/24 1604 01/27/24 2119 01/28/24 0840 01/28/24 1239  GLUCAP 172* 85 119* 86     No results found for this or any previous visit (from the past 240 hours).       Radiology Studies: MR Brain Wo Contrast (neuro protocol) Result Date: 01/27/2024 CLINICAL DATA:  Transient ischemic attack (TIA) EXAM: MRI HEAD WITHOUT CONTRAST TECHNIQUE: Multiplanar, multiecho pulse sequences of the brain and surrounding structures were obtained without intravenous contrast. COMPARISON:  CTA head/neck January 26, 2024. FINDINGS: Brain: No acute infarction, hemorrhage, hydrocephalus, extra-axial collection or mass lesion. Patchy T2/FLAIR hyperintensities the white matter, compatible chronic microvascular ischemic disease. Cerebral atrophy. Vascular: Major arterial flow  voids are maintained at the skull base. Chronic vertebrobasilar dolichoectasia. Skull and upper cervical spine: Normal marrow signal. Sinuses/Orbits: Clear sinuses.  No acute orbital findings. Other: Small mastoid effusions. IMPRESSION: No acute abnormality. Electronically Signed   By: Stevenson Elbe M.D.   On: 01/27/2024 17:15   CT Angio Head Neck W WO CM Result Date: 01/26/2024 CLINICAL DATA:  Confusion beginning at 11 a.m. today. EXAM: CT ANGIOGRAPHY HEAD AND NECK WITH AND WITHOUT CONTRAST TECHNIQUE: Multidetector CT imaging of the head and neck was performed using the standard protocol during bolus administration of intravenous contrast. Multiplanar CT image reconstructions and MIPs were obtained to evaluate the vascular anatomy. Carotid stenosis measurements (when applicable) are obtained utilizing NASCET criteria, using the distal internal carotid diameter as the denominator. RADIATION DOSE REDUCTION: This exam was performed according to the departmental dose-optimization program which includes automated exposure control, adjustment of the mA and/or kV according to patient size and/or use of iterative reconstruction technique. CONTRAST:  75mL OMNIPAQUE  IOHEXOL  350 MG/ML SOLN COMPARISON:  MR head  without contrast 01/27/2023. CT head without contrast 01/26/2023. FINDINGS: CT HEAD FINDINGS Brain: Mild generalized atrophy and white matter disease scratched at moderate generalized atrophy and white matter disease is again noted. Deep brain nuclei are within normal limits. The ventricles are proportionate to the degree of atrophy. No significant extraaxial fluid collection is present. A remote infarct is present in the medial right occipital pole. The brainstem and cerebellum are within normal limits. Midline structures are within normal limits. Vascular: Atherosclerotic calcifications are present within the cavernous internal carotid arteries. No hyperdense vessel is present. Skull: Calvarium is intact. No  focal lytic or blastic lesions are present. No significant extracranial soft tissue lesion is present. Sinuses/Orbits: The paranasal sinuses and mastoid air cells are clear. Bilateral lens replacements are noted. Globes and orbits are otherwise unremarkable. Review of the MIP images confirms the above findings CTA NECK FINDINGS Aortic arch: Atherosclerotic calcifications are present in the aortic arch and at the great vessel origins without focal stenosis. No aneurysm is present. No dissection is present. Right carotid system: The right common carotid artery is within normal limits. Atherosclerotic calcifications are present. No significant stenosis is present. Mild tortuosity is present cervical right ICA without significant stenosis. Left carotid system: The left common carotid artery is within normal limits. Atherosclerotic changes are present at the bifurcation. No significant stenosis is present. Mild tortuosity is present cervical left ICA without significant stenosis. Vertebral arteries: Vertebral arteries are codominant. Both vertebral arteries originate from the subclavian arteries without significant stenosis. No significant stenosis is present in either vertebral artery in the neck. Skeleton: Multilevel degenerative changes are present in the cervical spine. Slight anterolisthesis is present at C2-3 and C3-4. Grade 1 anterolisthesis at C4-5 measures 3 mm. Straightening of the normal cervical lordosis is present. Endplate sclerotic changes are present from C3-4 through C6-7. Vertebral body heights are normal. No focal osseous lesions are present. Other neck: Soft tissues the neck are otherwise unremarkable. Salivary glands are within normal limits. Thyroid  is normal. No significant adenopathy is present. No focal mucosal or submucosal lesions are present. Upper chest: The lung apices are clear. The thoracic inlet is within normal limits. Review of the MIP images confirms the above findings CTA HEAD  FINDINGS Anterior circulation: Minimal atherosclerotic calcifications are present within the cavernous internal carotid arteries bilaterally. No significant stenosis is present through the ICA termini. The A1 and M1 segments are normal. The anterior communicating artery is patent. The MCA and ACA branch vessels are within normal limits. No aneurysm is present. Posterior circulation: The PICA origins are visualized and normal. Ectasias of the more distal right vertebral artery and the basilar artery is again noted. Calcifications are present without focal stenosis. Vertebrobasilar junction is normal. Both posterior scratched at the superior cerebellar arteries are patent bilaterally. The posterior cerebral arteries originate from the basilar tip bilaterally. No aneurysm is present. Venous sinuses: The dural sinuses are patent. The straight sinus and deep cerebral veins are intact. Cortical veins are within normal limits. No significant vascular malformation is evident. Anatomic variants: None Review of the MIP images confirms the above findings IMPRESSION: 1. No acute intracranial abnormality or significant interval change. 2. 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 3. Mild generalized atrophy and white matter disease likely reflects the sequela of chronic microvascular ischemia. 4. No hemodynamically significant or correctable stenosis in the neck. 5. No large vessel occlusion or significant proximal stenosis within the Circle of Willis. 6. Ectasia of the more distal right vertebral artery  and the basilar artery is again noted. 7. Multilevel degenerative changes in the cervical spine. Electronically Signed   By: Audree Leas M.D.   On: 01/26/2024 18:59        Scheduled Meds:  buPROPion   300 mg Oral q morning   cyanocobalamin   1,000 mcg Oral Daily   desmopressin   400 mcg Oral QHS   insulin  aspart  0-5 Units Subcutaneous QHS   insulin  aspart  0-6 Units Subcutaneous TID WC   rivaroxaban    10 mg Oral Daily   rosuvastatin   20 mg Oral QHS   sodium chloride  flush  3 mL Intravenous Q12H   Continuous Infusions:  sodium chloride        LOS: 0 days       James Dadds, MD [facility]   To contact the attending provider between 7A-7P or the covering provider during after hours 7P-7A, please log into the web site [email or web address] and access using universal [place] password for that web site. If you do not have the password, please call the hospital operator.  01/28/2024, 1:46 PM

## 2024-01-28 NOTE — Evaluation (Signed)
 Occupational Therapy Evaluation Patient Details Name: James [name]. MRN: [id number] DOB: 10-11-1946 Today's Date: 01/28/2024   History of Present Illness Pt is a 78 yo male admitted with sudden onset confusion after running some errands while driving. CT and MRI -. Questionable TIA vs micturition syncope? PMH: DMII, HTN, sleep apnea, CML, polycythemia, splenomegaly, NASH cirrhosis.   Clinical Impression   Pt admitted with the above diagnosis and has the deficits outlined below. Pt would benefit from further OT to increase safety and balance with all adls and reduce falls at home during adls. Pt, at baseline, can complete most of adls with supervision from wife. Wife is not able to assist due to back problems that she is being seen for.  Pt falls at least once a week and is impulsive and unsafe with mobility which makes him an increased fall risk. Pt has 13 steps to his second floor to get to bedroom but son stated they can move his bed downstairs. Feel with the number of falls this pt has taken and with this most recent event, having bedroom on main floor would be helpful.  Pt also with some significant word finding deficits which son says has been there but is worse therefore SLP eval recommended. Feel pt would benefit from further cognitive testing and a possible OT driving evaluation as an outpatient to see if pt is safe to drive on the road.  Talked to son about all these recommendations.  Feel pt would benefit from [facility] ( as wife cannot drive due to pain and pt should not be driving due to cognitive deficits) and then transition to OP as family can arrange transportation.        If plan is discharge home, recommend the following: A little help with walking and/or transfers;A little help with bathing/dressing/bathroom;Assistance with cooking/housework;Help with stairs or ramp for entrance;Assist for transportation;Supervision due to cognitive status    Functional Status Assessment  Patient  has had a recent decline in their functional status and demonstrates the ability to make significant improvements in function in a reasonable and predictable amount of time.  Equipment Recommendations  None recommended by OT    Recommendations for Other Services       Precautions / Restrictions Precautions Precautions: Fall Precaution Comments: Pt falls once a week Restrictions Weight Bearing Restrictions Per Provider Order: No      Mobility Bed Mobility Overal bed mobility: Modified Independent             General bed mobility comments: Pt required cues for hand placement    Transfers Overall transfer level: Needs assistance Equipment used: Rolling walker (2 wheels) Transfers: Sit to/from Stand, Bed to chair/wheelchair/BSC Sit to Stand: Supervision     Step pivot transfers: Supervision     General transfer comment: Pt requires cues for safety.      Balance Overall balance assessment: Mild deficits observed, not formally tested                                         ADL either performed or assessed with clinical judgement   ADL Overall ADL's : Needs assistance/impaired Eating/Feeding: Independent;Sitting   Grooming: Wash/dry hands;Wash/dry face;Oral care;Supervision/safety;Standing Grooming Details (indicate cue type and reason): standing at sink Upper Body Bathing: Set up;Sitting   Lower Body Bathing: Supervison/ safety;Sit to/from stand   Upper Body Dressing : Set up;Sitting   Lower Body  Dressing: Supervision/safety;Sit to/from stand;Cueing for safety Lower Body Dressing Details (indicate cue type and reason): cuing to push up from bed not  pull up on walker. Toilet Transfer: Contact guard assist;Ambulation;Comfort height toilet;Rolling walker (2 wheels)   Toileting- Clothing Manipulation and Hygiene: Contact guard assist;Sit to/from stand       Functional mobility during ADLs: Contact guard assist;Rolling walker (2  wheels) General ADL Comments: Pt limited most with safety issues and repeated falls. Pt is umpulsive and walks quickly. Wife home but not available to assist pt if he falls or needs more than supervision because she has back issues.     Vision Baseline Vision/History: 1 Wears glasses Ability to See in Adequate Light: 0 Adequate Patient Visual Report: No change from baseline Vision Assessment?: Yes Eye Alignment: Within Functional Limits Ocular Range of Motion: Within Functional Limits Alignment/Gaze Preference: Within Defined Limits Tracking/Visual Pursuits: Able to track stimulus in all quads without difficulty Saccades: Within functional limits Convergence: Within functional limits Visual Fields: No apparent deficits Additional Comments: glasses for reading     Perception Perception: Within Functional Limits       Praxis Praxis: WFL       Pertinent Vitals/Pain Pain Assessment Pain Assessment: No/denies pain     Extremity/Trunk Assessment Upper Extremity Assessment Upper Extremity Assessment: Overall WFL for tasks assessed   Lower Extremity Assessment Lower Extremity Assessment: Defer to PT evaluation   Cervical / Trunk Assessment Cervical / Trunk Assessment: Normal   Communication Communication Communication: Difficulty communicating thoughts/reduced clarity of speech;Hearing impairment;Other (comment) (pt with word finding deficits) Cueing Techniques: Verbal cues   Cognition Arousal: Alert Behavior During Therapy: Impulsive Overall Cognitive Status: Impaired/Different from baseline Area of Impairment: Attention, Memory, Safety/judgement, Awareness                   Current Attention Level: Selective Memory: Decreased short-term memory   Safety/Judgement: Decreased awareness of safety, Decreased awareness of deficits Awareness: Emergent   General Comments: Pt has some baseline cognitive deficits per son but does not appear to be at baseline yet. Pt  very quick to move, unsafe at times with use of walker and is a fall risk.  Pt would benefit from cognitive evaluation to further look at safety with driving.     General Comments  Pt most limited by impulsivity, mild cognitive deficits and decreased balance.    Exercises     Shoulder Instructions      Home Living Family/patient expects to be discharged to:: Private residence Living Arrangements: Spouse/significant other Available Help at Discharge: Available PRN/intermittently Type of Home: House Home Access: Stairs to enter [employer] of Steps: 3 Entrance Stairs-Rails: Right;Left Home Layout: Two level Alternate Level Stairs-Number of Steps: 13 Alternate Level Stairs-Rails: Left Bathroom Shower/Tub: Walk-in shower;Door   Bathroom Toilet: Handicapped height     Home Equipment: Agricultural consultant (2 wheels);Shower seat          Prior Functioning/Environment Prior Level of Function : Driving;Independent/Modified Independent;History of Falls (last six months)             Mobility Comments: walks with walker some of time but not always. Falls, per report about once a week ADLs Comments: Completes basic adls without assist but occasionally falls.Wife cannot assist due to back issues.        OT Problem List: Impaired balance (sitting and/or standing);Decreased cognition;Decreased safety awareness;Decreased knowledge of precautions      OT Treatment/Interventions: Self-care/ADL training;DME and/or AE instruction;Cognitive remediation/compensation    OT Goals(Current  goals can be found in the care plan section) Acute Rehab OT Goals Patient Stated Goal: to go home OT Goal Formulation: With patient/family Time For Goal Achievement: 02/11/24 Potential to Achieve Goals: Good ADL Goals Pt Will Perform Grooming: with modified independence;standing Pt Will Perform Lower Body Bathing: with modified independence;sit to/from stand Pt Will Perform Lower Body Dressing:  with modified independence;sit to/from stand Additional ADL Goal #1: Pt will walk to bathroom with walker and complete all toileting with mod I Additional ADL Goal #2: Pt will complete pill box test to better advise pt/family about cognitive deficits and direct further cognitive therapy.  OT Frequency: Min 1X/week    Co-evaluation              AM-PAC OT "6 Clicks" Daily Activity     Outcome Measure Help from another person eating meals?: None Help from another person taking care of personal grooming?: A Little Help from another person toileting, which includes using toliet, bedpan, or urinal?: A Little Help from another person bathing (including washing, rinsing, drying)?: A Little Help from another person to put on and taking off regular upper body clothing?: A Little Help from another person to put on and taking off regular lower body clothing?: A Little 6 Click Score: 19   End of Session Equipment Utilized During Treatment: Rolling walker (2 wheels) Nurse Communication: Mobility status  Activity Tolerance: Patient tolerated treatment well Patient left: in chair;with call bell/phone within reach;with family/visitor present  OT Visit Diagnosis: Unsteadiness on feet (R26.81);Cognitive communication deficit (R41.841)                Time: 4098-1191 OT Time Calculation (min): 66 min Charges:  OT General Charges $OT Visit: 1 Visit OT Evaluation $OT Eval Moderate Complexity: 1 Mod OT Treatments $Self Care/Home Management : 38-52 mins  Carla Charon 01/28/2024, 9:57 AM

## 2024-01-29 ENCOUNTER — Other Ambulatory Visit (HOSPITAL_COMMUNITY): Payer: Self-pay

## 2024-01-29 ENCOUNTER — Other Ambulatory Visit: Payer: Self-pay

## 2024-01-29 ENCOUNTER — Inpatient Hospital Stay: Payer: Medicare Other

## 2024-01-29 MED ORDER — DASATINIB 100 MG PO TABS
100.0000 mg | ORAL_TABLET | Freq: Every day | ORAL | 11 refills | Status: DC
  Filled 2024-01-29: qty 30, 30d supply, fill #0

## 2024-01-29 MED ORDER — DASATINIB 100 MG PO TABS: 100.0000 mg | ORAL_TABLET | Freq: Every day | ORAL | 11 refills | Status: DC

## 2024-01-30 ENCOUNTER — Other Ambulatory Visit: Payer: Self-pay

## 2024-01-30 ENCOUNTER — Telehealth: Payer: Self-pay | Admitting: *Deleted

## 2024-01-30 LAB — BCR-ABL1, CML/ALL, PCR, QUANT
E1A2 Transcript: <0.0032 %
Interpretation (BCRAL):: NEGATIVE
b2a2 transcript: <0.0032 %
b3a2 transcript: <0.0032 %

## 2024-01-30 NOTE — Telephone Encounter (Signed)
-----   Message from Josph Macho sent at 01/30/2024  3:45 PM EST ----- Please call and let him know that the CML is still in remission.  We can still stop the Sprycel.[name]

## 2024-01-30 NOTE — Telephone Encounter (Signed)
As noted below by Dr. Myna Hidalgo, I called and left a message informing the patient that the CML is still in remission. We can still stop the Sprycel. Please call the office if you have any questions or concerns.

## 2024-01-30 NOTE — Progress Notes (Signed)
Therapy completed. Disenrolling from [employer].

## 2024-02-05 ENCOUNTER — Inpatient Hospital Stay: Payer: Medicare Other

## 2024-02-05 VITALS — BP 144/82 | HR 78 | Temp 98.1°F | Resp 17

## 2024-02-05 DIAGNOSIS — D500 Iron deficiency anemia secondary to blood loss (chronic): Secondary | ICD-10-CM

## 2024-02-05 DIAGNOSIS — C9210 Chronic myeloid leukemia, BCR/ABL-positive, not having achieved remission: Secondary | ICD-10-CM | POA: Diagnosis not present

## 2024-02-05 NOTE — Progress Notes (Signed)
Per Dr. Myna Hidalgo proceed with phlebotomy without labs. Pt aware of plan and agreeable.  James [name]. presents today for phlebotomy per MD orders. Phlebotomy procedure started at 0817 and ended at 0825. 537 grams removed viia 16 gauge needle to left AC using phlebotomy kit. Patient declined to stay for  observation period after procedure stating he has had procedure multiple times without incident. Pt given cup of coffee and stated he had eaten a good breakfast. Patient tolerated procedure well. IV needle removed intact.

## 2024-02-05 NOTE — Patient Instructions (Signed)

## 2024-02-28 ENCOUNTER — Emergency Department (HOSPITAL_BASED_OUTPATIENT_CLINIC_OR_DEPARTMENT_OTHER)
Admission: EM | Admit: 2024-02-28 | Discharge: 2024-02-28 | Disposition: A | Discharge status: Home / Self Care | Source: Home / Self Care | Attending: Emergency Medicine | Admitting: Emergency Medicine

## 2024-02-28 ENCOUNTER — Other Ambulatory Visit: Payer: Self-pay

## 2024-02-28 ENCOUNTER — Emergency Department (HOSPITAL_BASED_OUTPATIENT_CLINIC_OR_DEPARTMENT_OTHER)

## 2024-02-28 ENCOUNTER — Encounter (HOSPITAL_BASED_OUTPATIENT_CLINIC_OR_DEPARTMENT_OTHER): Payer: Self-pay | Admitting: Emergency Medicine

## 2024-02-28 DIAGNOSIS — R410 Disorientation, unspecified: Secondary | ICD-10-CM | POA: Insufficient documentation

## 2024-02-28 DIAGNOSIS — E119 Type 2 diabetes mellitus without complications: Secondary | ICD-10-CM | POA: Insufficient documentation

## 2024-02-28 DIAGNOSIS — R531 Weakness: Secondary | ICD-10-CM | POA: Insufficient documentation

## 2024-02-28 DIAGNOSIS — G3183 Dementia with Lewy bodies: Secondary | ICD-10-CM | POA: Diagnosis not present

## 2024-02-28 DIAGNOSIS — I10 Essential (primary) hypertension: Secondary | ICD-10-CM | POA: Insufficient documentation

## 2024-02-28 LAB — URINALYSIS, ROUTINE W REFLEX MICROSCOPIC
Bilirubin Urine: NEGATIVE
Glucose, UA: NEGATIVE mg/dL
Hgb urine dipstick: NEGATIVE
Ketones, ur: NEGATIVE mg/dL
Leukocytes,Ua: NEGATIVE
Nitrite: NEGATIVE
Protein, ur: 30 mg/dL — AB
Specific Gravity, Urine: 1.025 (ref 1.005–1.030)
pH: 6 (ref 5.0–8.0)

## 2024-02-28 LAB — CBC
HCT: 49.6 % (ref 39.0–52.0)
Hemoglobin: 15.9 g/dL (ref 13.0–17.0)
MCH: 29.6 pg (ref 26.0–34.0)
MCHC: 32.1 g/dL (ref 30.0–36.0)
MCV: 92.4 fL (ref 80.0–100.0)
Platelets: 280 10*3/uL (ref 150–400)
RBC: 5.37 MIL/uL (ref 4.22–5.81)
RDW: 15 % (ref 11.5–15.5)
WBC: 5.9 10*3/uL (ref 4.0–10.5)
nRBC: 0 % (ref 0.0–0.2)

## 2024-02-28 LAB — AMMONIA: Ammonia: 30 umol/L (ref 9–35)

## 2024-02-28 LAB — URINALYSIS, MICROSCOPIC (REFLEX)
RBC / HPF: NONE SEEN RBC/hpf (ref 0–5)
WBC, UA: NONE SEEN WBC/hpf (ref 0–5)

## 2024-02-28 LAB — COMPREHENSIVE METABOLIC PANEL
ALT: 16 U/L (ref 0–44)
AST: 19 U/L (ref 15–41)
Albumin: 4.1 g/dL (ref 3.5–5.0)
Alkaline Phosphatase: 76 U/L (ref 38–126)
Anion gap: 5 (ref 5–15)
BUN: 27 mg/dL — ABNORMAL HIGH (ref 8–23)
CO2: 23 mmol/L (ref 22–32)
Calcium: 11.3 mg/dL — ABNORMAL HIGH (ref 8.9–10.3)
Chloride: 106 mmol/L (ref 98–111)
Creatinine, Ser: 1.43 mg/dL — ABNORMAL HIGH (ref 0.61–1.24)
GFR, Estimated: 50 mL/min — ABNORMAL LOW (ref >60)
Glucose, Bld: 107 mg/dL — ABNORMAL HIGH (ref 70–99)
Potassium: 4.4 mmol/L (ref 3.5–5.1)
Sodium: 134 mmol/L — ABNORMAL LOW (ref 135–145)
Total Bilirubin: 0.7 mg/dL (ref 0.0–1.2)
Total Protein: 7 g/dL (ref 6.5–8.1)

## 2024-02-28 LAB — CBG MONITORING, ED: Glucose-Capillary: 108 mg/dL — ABNORMAL HIGH (ref 70–99)
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
## 2024-02-28 NOTE — ED Notes (Signed)
 Family called staff in, the pt was sitting at the bottom of the bed and wanted to leave. Little confused, and upset, which is abnormal per with wife. The pt was ambulatory with a +1 assist and was placed back in bed. No current distress other than being annoyed by the CT imaging wait time. Pt was covered with a sheet and hooked back up to the monitor.

## 2024-02-28 NOTE — ED Triage Notes (Signed)
 Pt POV in wheelchair- with spouse- pt seen at PCP on Monday for weakness.  Spouse reports increased weakness, increased feeling of anxiety (started on propanolol Monday) Spouse reports decreased memory, ability to follow commands x 2 days.   Denies urinary sx.   AOx2, not at baseline.

## 2024-02-28 NOTE — Discharge Instructions (Addendum)
 Thank you for letting us evaluate you today.  There is no acute cause for symptoms.  Please follow-up with neurology regarding cognitive deficit workup. Follow up with PCP if you need rehab / nursing facility placement  Return to ED if symptoms significantly worsen

## 2024-02-28 NOTE — ED Provider Notes (Signed)
 [facility] Provider Note   CSN: [id number] Arrival date & time: 02/28/24  1445     History  Chief Complaint  Patient presents with   Altered Mental Status   Weakness    James [name]. is a 78 y.o. male with past medical history of HTN, T2DM, OSA on CPAP, CML (in remission), polycythemia vera (on Xarelto), NASH cirrhosis, CKD stage III with splenomegaly presents to emergency department with wife for evaluation of generalized decrease in strength, difficulty following commands, and mild confusion over the past 48 hours.  Wife reports that he has been needing increasing assistance with ambulation and getting in and out of car.  He normally walks with a walker and shuffles his feet at baseline for the past year but seems to be more unsteady over the past 48 hours. Wife brought patient in as it is becoming increasingly difficult to take care of him at home due to weakness, difficulty with ADLs. She has been researching stair chair lifts to assist him upstairs.  Wife reports that patient has not been wearing his CPAP at night due to mask irritation and him taking taking it off in middle of night.  She is concerned that he may not be sleeping well and this could be contributing to symptoms.  Of note, patient was admitted on 01/26/2024 for dizziness, confusion, unsteady gait, feeling off balance.  He had a normal CTA, MRI brain, EEG. Echo EF 55-60%.  Recently, he stopped Xanax and Sprycel per physician order.  They saw his PCP on Monday and was put on propranolol for anxiety taking his first 1 on Tuesday.  They stopped this as they were concerned that it could have been affecting his short-term memory.  Following hospital discharge 1 month ago, patient stopped taking Sprycel as he is currently in remission of CML and they were concerned that it is contributing to unsteady gait, confusion. However, his symptoms have not improved  Patient complains of  feeling confused but has no other complaints.  He denies recent falls, CP, SHOB, infectious symptoms, urinary symptoms.   Altered Mental Status Presenting symptoms: confusion   Associated symptoms: weakness   Associated symptoms: no abdominal pain, no fever, no headaches, no light-headedness, no nausea, no palpitations, no seizures and no vomiting   Weakness Associated symptoms: no abdominal pain, no chest pain, no cough, no diarrhea, no dizziness, no fever, no headaches, no nausea, no seizures, no shortness of breath and no vomiting       Home Medications Prior to Admission medications   Medication Sig Start Date End Date Taking? Authorizing Provider  buPROPion (WELLBUTRIN XL) 300 MG 24 hr tablet Take 300 mg by mouth every morning. 07/20/20   [provider]  dasatinib (SPRYCEL) 100 MG tablet Take 1 tablet (100 mg total) by mouth daily. 01/29/24   Josph Macho, MD  desmopressin (DDAVP) 0.2 MG tablet Take 400 mcg by mouth at bedtime. 12/28/21   [provider]  fluticasone (FLONASE) 50 MCG/ACT nasal spray Place 1 spray into both nostrils as needed for allergies. Patient not taking: Reported on 01/28/2024 10/23/19   [provider]  glucose blood test strip OneTouch Ultra Blue Test Strip  USE TO TEST BLOOD SUGAR ONCE DAILY    [provider]  metFORMIN (GLUCOPHAGE-XR) 500 MG 24 hr tablet Take 500 mg by mouth 2 (two) times daily. 11/08/20   [provider]  moxifloxacin (VIGAMOX) 0.5 % ophthalmic solution Place 1 drop  into the right eye See admin instructions. Place 1 drop in right eye QID the day before, day of, and day after treatment. Patient not taking: Reported on 01/28/2024 02/15/18   [provider]  rosuvastatin (CRESTOR) 20 MG tablet Take 20 mg by mouth at bedtime. 12/18/22   [provider]  testosterone cypionate (DEPOTESTOSTERONE CYPIONATE) 200 MG/ML injection Inject 200 mg into the muscle every 21 ( twenty-one) days.     [provider]  vitamin B-12 (CYANOCOBALAMIN) 1000 MCG tablet Take 1,000 mcg by mouth daily.    [provider]  XARELTO 10 MG TABS tablet TAKE 1 TABLET BY MOUTH EVERY DAY 09/21/23   Josph Macho, MD      Allergies    Ibuprofen and Nsaids    Review of Systems   Review of Systems  Constitutional:  Negative for chills, fatigue and fever.  Respiratory:  Negative for cough, chest tightness, shortness of breath and wheezing.   Cardiovascular:  Negative for chest pain and palpitations.  Gastrointestinal:  Negative for abdominal pain, constipation, diarrhea, nausea and vomiting.  Neurological:  Positive for weakness. Negative for dizziness, seizures, light-headedness, numbness and headaches.  Psychiatric/Behavioral:  Positive for confusion.     Physical Exam Updated Vital Signs BP (!) 160/97   Pulse 73   Temp 98.1 F (36.7 C)   Resp 16   Ht 5\' 6"  (1.676 m)   Wt 71.7 kg   SpO2 99%   BMI 25.50 kg/m  Physical Exam Vitals and nursing note reviewed.  Constitutional:      General: He is not in acute distress.    Appearance: Normal appearance.  HENT:     Head: Normocephalic and atraumatic.  Eyes:     General: No visual field deficit.    Conjunctiva/sclera: Conjunctivae normal.  Cardiovascular:     Rate and Rhythm: Normal rate.  Pulmonary:     Effort: Pulmonary effort is normal. No respiratory distress.  Skin:    Coloration: Skin is not jaundiced or pale.  Neurological:     Mental Status: He is alert. Mental status is at baseline.     Cranial Nerves: Cranial nerves 2-12 are intact. No dysarthria or facial asymmetry.     Sensory: Sensation is intact.     Motor: No weakness, tremor, abnormal muscle tone, seizure activity or pronator drift.     Coordination: Coordination is intact. Coordination normal. Finger-Nose-Finger Test and Heel to [facility] Test normal.     Gait: Gait abnormal.     Deep Tendon Reflexes:     Reflex Scores:      Bicep reflexes are 2+ on the  right side and 2+ on the left side.      Patellar reflexes are 2+ on the right side and 2+ on the left side.    Comments: Earlier in ED course, patient was GCS 15, A&O x 3 although he did take a minute to think about his answers first.  However, as it became nighttime, he has become increasingly agitated with wife.  He will use obscene words towards her.  He had increased confusion regarding time at night taking longer time thinking before answer.  No focal deficits. Able to  Ambulation with walker with slow shuffling gait per his baseline.     ED Results / Procedures / Treatments   Labs (all labs ordered are listed, but only abnormal results are displayed) Labs Reviewed  COMPREHENSIVE METABOLIC PANEL - Abnormal; Notable for the following components:  Result Value   Sodium 134 (*)    Glucose, Bld 107 (*)    BUN 27 (*)    Creatinine, Ser 1.43 (*)    Calcium 11.3 (*)    GFR, Estimated 50 (*)    All other components within normal limits  URINALYSIS, ROUTINE W REFLEX MICROSCOPIC - Abnormal; Notable for the following components:   Protein, ur 30 (*)    All other components within normal limits  URINALYSIS, MICROSCOPIC (REFLEX) - Abnormal; Notable for the following components:   Bacteria, UA RARE (*)    All other components within normal limits  CBG MONITORING, ED - Abnormal; Notable for the following components:   Glucose-Capillary 108 (*)    All other components within normal limits  CBC  AMMONIA    EKG EKG Interpretation Date/Time:  Thursday February 28 2024 14:52:48 EDT Ventricular Rate:  75 PR Interval:  177 QRS Duration:  83 QT Interval:  379 QTC Calculation: 424 R Axis:   -59  Text Interpretation: Sinus rhythm Abnormal R-wave progression, late transition Inferior infarct, old No significant change since last tracing Confirmed by Vanetta Mulders [phone]) on 02/28/2024 6:24:23 PM  Radiology CT Head Wo Contrast Result Date: 02/28/2024 CLINICAL DATA:  Weakness EXAM: CT HEAD  WITHOUT CONTRAST TECHNIQUE: Contiguous axial images were obtained from the base of the skull through the vertex without intravenous contrast. RADIATION DOSE REDUCTION: This exam was performed according to the departmental dose-optimization program which includes automated exposure control, adjustment of the mA and/or kV according to patient size and/or use of iterative reconstruction technique. COMPARISON:  08/06/2024 FINDINGS: Brain: There is no mass, hemorrhage or extra-axial collection. There is generalized atrophy without lobar predilection. Hypodensity of the white matter is most commonly associated with chronic microvascular disease. Vascular: Tortuous basilar artery, unchanged. Skull: The visualized skull base, calvarium and extracranial soft tissues are normal. Sinuses/Orbits: Small left mastoid effusion. Paranasal sinuses are clear. Normal orbits. Other: None. IMPRESSION: 1. No acute intracranial abnormality. 2. Generalized atrophy and findings of chronic microvascular disease. 3. Small left mastoid effusion. Electronically Signed   By: Deatra Robinson M.D.   On: 02/28/2024 19:12    Procedures Procedures    Medications Ordered in ED Medications - No data to display  ED Course/ Medical Decision Making/ A&P                                 Medical Decision Making Amount and/or Complexity of Data Reviewed Labs: ordered. Radiology: ordered.   Patient presents to the ED for concern of weakness confusion, this involves an extensive number of treatment options, and is a complaint that carries with it a high risk of complications and morbidity.  The differential diagnosis includes to TIA, CVA, infection, electrolyte abnormality, ICH, UTI, hypoglycemia, hyperglycemia, undiagnosed cognitive deficit, dementia, Alzheimer's, sundowners, pneumonia, sepsis   Co morbidities that complicate the patient evaluation  See HPI   Additional history obtained:  Additional history obtained from [facility],  Nursing, Outside Medical Records, and Past Admission   External records from outside source obtained and reviewed including triage RN note, admission from 01/26/2024   Lab Tests:  I Ordered, and personally interpreted labs.  The pertinent results include:   CBG 108 UA without infection Creatinine 1.43 Ammonia 30 No leukocytosis   Imaging Studies ordered:  I ordered imaging studies including CT head without contrast I independently visualized and interpreted imaging which showed no abnormalities I agree with the  radiologist interpretation   Cardiac Monitoring:  The patient was maintained on a cardiac monitor.  I personally viewed and interpreted the cardiac monitored which showed an underlying rhythm of: Sinus rhythm with no previous change from prior    Consultations Obtained:  I requested consultation with neurology Dr. Tollie Eth,  and discussed lab and imaging findings as well as pertinent plan - they recommend: outpatient f/u for official dementia workup in setting of recent normal CTA, MRI, EEG, echo one month ago with similar symptoms. He does not feel patient requires admission from neurological standpoint   Problem List / ED Course:  Generalized weakness Confusion Weakness is most notable when ambulating and standing on his own however it is generalized and not focal.  When in bed, no weakness is notable of BUE and BLE ED workup is negative for any acute cause of weakness, confusion.  CT without acute abnormalities As patient has had symptoms progressively worsening, do not feel that this is a new abnormality.  Although patient's wife initially said that symptoms have been worsening over the past 48 hours, she is unsure how long symptoms have been persisting and is not necessarily the best historian on time.  However, unsteady gait has been noted in chart review for 1 year.  Symptoms seem similar to symptoms presented at last admission with negative workup/admission.   Wife has already done significant research on how to take care of her husband at home to include home health aide, motorized stair chair therefore do not believe that the symptoms are new over the past 48 hours.  I am concerned about undiagnosed dementia and/or sundowners.  He follows neurology however no formal workup has been done CT is negative for ICH, acute intracranial abnormalities.  There is mild mastoid effusion in left ear.  Normal HEENT exam with no infectious signs on physical exam.  No mastoid no tragal tenderness low suspicion for mastoiditis and AOM Low suspicion for ICH as he has not recently fallen.  CT negative for this as well Low suspicion for acute CVA as symptoms have been persisting and worsening over the past year.  He also has no focal deficits Low suspicion for sepsis.  No fever, no infectious complaints.  UA negative for infection.  No leukocytosis. CBG WNL Consulted neuro who reviewed ED workup, neurology outpatient follow-up.  He agrees that no formal workup has been made for possible dementia or cognitive deficit.  He does not see a neurological reason to admit patient or have further imaging.  He recommends the patient follow-up outpatient with neurology if he is medically cleared.  I think this is reasonable. I discussed patient following up with PCP regarding possible rehab or SNF placement.  Unfortunately, there is no opportunity to border patient and have him wait for placement here at [facility] ED so that would require an ED to ED transfer.  I do not feel that this would be beneficial to patient.  Wife agrees that she will follow-up with PCP regarding possible placement.  Patient's wife is agreeable to discharge   Reevaluation:  After the interventions noted above, I reevaluated the patient and found that they have :stayed the same   Social Determinants of Health:  Has PCP, neurology follow-up   Dispostion:  After consideration of the diagnostic results and  the patients response to treatment, I feel that the patent would benefit from patient management and neurology follow-up.   Discussed ED workup, disposition, return to ED precautions with patient who expresses  understanding agrees with plan.  All questions answered to their satisfaction.  They are agreeable to plan.  Discharge instructions provided on paperwork   Final Clinical Impression(s) / ED Diagnoses Final diagnoses:  Generalized weakness  Confusion    Rx / DC Orders ED Discharge Orders     None         Judithann Sheen, PA 02/28/24 2300    Vanetta Mulders, MD 03/01/24 1630

## 2024-02-28 NOTE — ED Notes (Signed)
 Pt more calm at present, eating baked potato, pulled up in bed, blankets given emotional support given to spouse

## 2024-02-29 ENCOUNTER — Encounter (HOSPITAL_COMMUNITY): Payer: Self-pay

## 2024-02-29 ENCOUNTER — Inpatient Hospital Stay (HOSPITAL_COMMUNITY)
Admission: EM | Admit: 2024-02-29 | Discharge: 2024-03-18 | DRG: 056 | Disposition: E | Attending: Internal Medicine | Admitting: Internal Medicine

## 2024-02-29 ENCOUNTER — Emergency Department (HOSPITAL_COMMUNITY)

## 2024-02-29 ENCOUNTER — Other Ambulatory Visit: Payer: Self-pay

## 2024-02-29 DIAGNOSIS — R569 Unspecified convulsions: Secondary | ICD-10-CM | POA: Diagnosis not present

## 2024-02-29 DIAGNOSIS — K7581 Nonalcoholic steatohepatitis (NASH): Secondary | ICD-10-CM | POA: Diagnosis not present

## 2024-02-29 DIAGNOSIS — Z6825 Body mass index (BMI) 25.0-25.9, adult: Secondary | ICD-10-CM

## 2024-02-29 DIAGNOSIS — E669 Obesity, unspecified: Secondary | ICD-10-CM | POA: Diagnosis present

## 2024-02-29 DIAGNOSIS — K7460 Unspecified cirrhosis of liver: Secondary | ICD-10-CM | POA: Diagnosis present

## 2024-02-29 DIAGNOSIS — E559 Vitamin D deficiency, unspecified: Secondary | ICD-10-CM | POA: Diagnosis not present

## 2024-02-29 DIAGNOSIS — G3183 Dementia with Lewy bodies: Secondary | ICD-10-CM | POA: Diagnosis present

## 2024-02-29 DIAGNOSIS — Z8249 Family history of ischemic heart disease and other diseases of the circulatory system: Secondary | ICD-10-CM

## 2024-02-29 DIAGNOSIS — N1831 Chronic kidney disease, stage 3a: Secondary | ICD-10-CM | POA: Diagnosis present

## 2024-02-29 DIAGNOSIS — R1310 Dysphagia, unspecified: Secondary | ICD-10-CM | POA: Diagnosis present

## 2024-02-29 DIAGNOSIS — G4733 Obstructive sleep apnea (adult) (pediatric): Secondary | ICD-10-CM | POA: Diagnosis not present

## 2024-02-29 DIAGNOSIS — D509 Iron deficiency anemia, unspecified: Secondary | ICD-10-CM | POA: Diagnosis present

## 2024-02-29 DIAGNOSIS — D751 Secondary polycythemia: Secondary | ICD-10-CM | POA: Diagnosis present

## 2024-02-29 DIAGNOSIS — Z7901 Long term (current) use of anticoagulants: Secondary | ICD-10-CM

## 2024-02-29 DIAGNOSIS — Z888 Allergy status to other drugs, medicaments and biological substances status: Secondary | ICD-10-CM

## 2024-02-29 DIAGNOSIS — K219 Gastro-esophageal reflux disease without esophagitis: Secondary | ICD-10-CM | POA: Diagnosis not present

## 2024-02-29 DIAGNOSIS — I129 Hypertensive chronic kidney disease with stage 1 through stage 4 chronic kidney disease, or unspecified chronic kidney disease: Secondary | ICD-10-CM | POA: Diagnosis present

## 2024-02-29 DIAGNOSIS — F0154 Vascular dementia, unspecified severity, with anxiety: Secondary | ICD-10-CM | POA: Diagnosis present

## 2024-02-29 DIAGNOSIS — Z833 Family history of diabetes mellitus: Secondary | ICD-10-CM

## 2024-02-29 DIAGNOSIS — F05 Delirium due to known physiological condition: Secondary | ICD-10-CM | POA: Diagnosis present

## 2024-02-29 DIAGNOSIS — E1165 Type 2 diabetes mellitus with hyperglycemia: Secondary | ICD-10-CM | POA: Diagnosis present

## 2024-02-29 DIAGNOSIS — Z8679 Personal history of other diseases of the circulatory system: Secondary | ICD-10-CM

## 2024-02-29 DIAGNOSIS — G9341 Metabolic encephalopathy: Secondary | ICD-10-CM | POA: Diagnosis present

## 2024-02-29 DIAGNOSIS — R4587 Impulsiveness: Secondary | ICD-10-CM | POA: Diagnosis present

## 2024-02-29 DIAGNOSIS — R4701 Aphasia: Secondary | ICD-10-CM | POA: Diagnosis present

## 2024-02-29 DIAGNOSIS — R29810 Facial weakness: Secondary | ICD-10-CM | POA: Diagnosis not present

## 2024-02-29 DIAGNOSIS — R2689 Other abnormalities of gait and mobility: Secondary | ICD-10-CM | POA: Diagnosis present

## 2024-02-29 DIAGNOSIS — R2681 Unsteadiness on feet: Secondary | ICD-10-CM | POA: Diagnosis present

## 2024-02-29 DIAGNOSIS — R4586 Emotional lability: Secondary | ICD-10-CM | POA: Diagnosis present

## 2024-02-29 DIAGNOSIS — Z515 Encounter for palliative care: Secondary | ICD-10-CM | POA: Diagnosis not present

## 2024-02-29 DIAGNOSIS — R296 Repeated falls: Secondary | ICD-10-CM | POA: Diagnosis present

## 2024-02-29 DIAGNOSIS — I469 Cardiac arrest, cause unspecified: Secondary | ICD-10-CM | POA: Diagnosis not present

## 2024-02-29 DIAGNOSIS — Z96611 Presence of right artificial shoulder joint: Secondary | ICD-10-CM | POA: Diagnosis present

## 2024-02-29 DIAGNOSIS — Z87891 Personal history of nicotine dependence: Secondary | ICD-10-CM

## 2024-02-29 DIAGNOSIS — I7143 Infrarenal abdominal aortic aneurysm, without rupture: Secondary | ICD-10-CM | POA: Diagnosis present

## 2024-02-29 DIAGNOSIS — R531 Weakness: Principal | ICD-10-CM | POA: Diagnosis present

## 2024-02-29 DIAGNOSIS — E0800 Diabetes mellitus due to underlying condition with hyperosmolarity without nonketotic hyperglycemic-hyperosmolar coma (NKHHC): Secondary | ICD-10-CM | POA: Diagnosis not present

## 2024-02-29 DIAGNOSIS — D500 Iron deficiency anemia secondary to blood loss (chronic): Secondary | ICD-10-CM | POA: Diagnosis not present

## 2024-02-29 DIAGNOSIS — C9210 Chronic myeloid leukemia, BCR/ABL-positive, not having achieved remission: Secondary | ICD-10-CM | POA: Diagnosis not present

## 2024-02-29 DIAGNOSIS — Z886 Allergy status to analgesic agent status: Secondary | ICD-10-CM

## 2024-02-29 DIAGNOSIS — R269 Unspecified abnormalities of gait and mobility: Secondary | ICD-10-CM

## 2024-02-29 DIAGNOSIS — Z66 Do not resuscitate: Secondary | ICD-10-CM | POA: Diagnosis present

## 2024-02-29 DIAGNOSIS — Z7189 Other specified counseling: Secondary | ICD-10-CM | POA: Diagnosis not present

## 2024-02-29 DIAGNOSIS — E119 Type 2 diabetes mellitus without complications: Secondary | ICD-10-CM

## 2024-02-29 DIAGNOSIS — E860 Dehydration: Secondary | ICD-10-CM | POA: Diagnosis present

## 2024-02-29 DIAGNOSIS — F419 Anxiety disorder, unspecified: Secondary | ICD-10-CM | POA: Diagnosis not present

## 2024-02-29 DIAGNOSIS — I10 Essential (primary) hypertension: Secondary | ICD-10-CM | POA: Diagnosis not present

## 2024-02-29 DIAGNOSIS — Z8673 Personal history of transient ischemic attack (TIA), and cerebral infarction without residual deficits: Secondary | ICD-10-CM

## 2024-02-29 DIAGNOSIS — Z800 Family history of malignant neoplasm of digestive organs: Secondary | ICD-10-CM

## 2024-02-29 DIAGNOSIS — C9211 Chronic myeloid leukemia, BCR/ABL-positive, in remission: Secondary | ICD-10-CM | POA: Diagnosis present

## 2024-02-29 DIAGNOSIS — R4182 Altered mental status, unspecified: Secondary | ICD-10-CM | POA: Diagnosis not present

## 2024-02-29 DIAGNOSIS — D45 Polycythemia vera: Secondary | ICD-10-CM

## 2024-02-29 DIAGNOSIS — I81 Portal vein thrombosis: Secondary | ICD-10-CM

## 2024-02-29 DIAGNOSIS — N179 Acute kidney failure, unspecified: Secondary | ICD-10-CM | POA: Diagnosis present

## 2024-02-29 DIAGNOSIS — E1122 Type 2 diabetes mellitus with diabetic chronic kidney disease: Secondary | ICD-10-CM | POA: Diagnosis present

## 2024-02-29 DIAGNOSIS — E785 Hyperlipidemia, unspecified: Secondary | ICD-10-CM | POA: Diagnosis not present

## 2024-02-29 DIAGNOSIS — Z823 Family history of stroke: Secondary | ICD-10-CM

## 2024-02-29 DIAGNOSIS — R351 Nocturia: Secondary | ICD-10-CM | POA: Diagnosis present

## 2024-02-29 DIAGNOSIS — R278 Other lack of coordination: Secondary | ICD-10-CM | POA: Diagnosis not present

## 2024-02-29 DIAGNOSIS — H9190 Unspecified hearing loss, unspecified ear: Secondary | ICD-10-CM | POA: Diagnosis present

## 2024-02-29 DIAGNOSIS — Z79899 Other long term (current) drug therapy: Secondary | ICD-10-CM

## 2024-02-29 DIAGNOSIS — K2100 Gastro-esophageal reflux disease with esophagitis, without bleeding: Secondary | ICD-10-CM | POA: Diagnosis not present

## 2024-02-29 DIAGNOSIS — Z96651 Presence of right artificial knee joint: Secondary | ICD-10-CM | POA: Diagnosis present

## 2024-02-29 DIAGNOSIS — Z8619 Personal history of other infectious and parasitic diseases: Secondary | ICD-10-CM

## 2024-02-29 DIAGNOSIS — R410 Disorientation, unspecified: Secondary | ICD-10-CM | POA: Diagnosis not present

## 2024-02-29 DIAGNOSIS — R4189 Other symptoms and signs involving cognitive functions and awareness: Secondary | ICD-10-CM | POA: Diagnosis present

## 2024-02-29 DIAGNOSIS — Z860101 Personal history of adenomatous and serrated colon polyps: Secondary | ICD-10-CM

## 2024-02-29 DIAGNOSIS — F0284 Dementia in other diseases classified elsewhere, unspecified severity, with anxiety: Secondary | ICD-10-CM | POA: Diagnosis present

## 2024-02-29 LAB — COMPREHENSIVE METABOLIC PANEL
ALT: 15 U/L (ref 0–44)
AST: 19 U/L (ref 15–41)
Albumin: 3.8 g/dL (ref 3.5–5.0)
Alkaline Phosphatase: 75 U/L (ref 38–126)
Anion gap: 8 (ref 5–15)
BUN: 22 mg/dL (ref 8–23)
CO2: 24 mmol/L (ref 22–32)
Calcium: 11.3 mg/dL — ABNORMAL HIGH (ref 8.9–10.3)
Chloride: 107 mmol/L (ref 98–111)
Creatinine, Ser: 1.58 mg/dL — ABNORMAL HIGH (ref 0.61–1.24)
GFR, Estimated: 44 mL/min — ABNORMAL LOW (ref >60)
Glucose, Bld: 197 mg/dL — ABNORMAL HIGH (ref 70–99)
Potassium: 4.2 mmol/L (ref 3.5–5.1)
Sodium: 139 mmol/L (ref 135–145)
Total Bilirubin: 0.7 mg/dL (ref 0.0–1.2)
Total Protein: 6.8 g/dL (ref 6.5–8.1)

## 2024-02-29 LAB — SEDIMENTATION RATE: Sed Rate: 1 mm/h (ref 0–16)

## 2024-02-29 LAB — CBC
HCT: 50.8 % (ref 39.0–52.0)
Hemoglobin: 16.5 g/dL (ref 13.0–17.0)
MCH: 29.7 pg (ref 26.0–34.0)
MCHC: 32.5 g/dL (ref 30.0–36.0)
MCV: 91.5 fL (ref 80.0–100.0)
Platelets: 262 10*3/uL (ref 150–400)
RBC: 5.55 MIL/uL (ref 4.22–5.81)
RDW: 14.7 % (ref 11.5–15.5)
WBC: 5.7 10*3/uL (ref 4.0–10.5)
nRBC: 0 % (ref 0.0–0.2)

## 2024-02-29 LAB — FOLATE: Folate: 14.8 ng/mL (ref >5.9)

## 2024-02-29 LAB — RESP PANEL BY RT-PCR (RSV, FLU A&B, COVID)  RVPGX2
Influenza A by PCR: NEGATIVE
Influenza B by PCR: NEGATIVE
Resp Syncytial Virus by PCR: NEGATIVE
SARS Coronavirus 2 by RT PCR: NEGATIVE

## 2024-02-29 LAB — VITAMIN B12: Vitamin B-12: 2427 pg/mL — ABNORMAL HIGH (ref 180–914)

## 2024-02-29 LAB — VITAMIN D 25 HYDROXY (VIT D DEFICIENCY, FRACTURES): Vit D, 25-Hydroxy: 26.71 ng/mL — ABNORMAL LOW (ref 30–100)

## 2024-02-29 LAB — TSH: TSH: 1.746 u[IU]/mL (ref 0.350–4.500)

## 2024-02-29 LAB — AMMONIA: Ammonia: 46 umol/L — ABNORMAL HIGH (ref 9–35)

## 2024-02-29 LAB — GLUCOSE, CAPILLARY
Glucose-Capillary: 202 mg/dL — ABNORMAL HIGH (ref 70–99)
Glucose-Capillary: 95 mg/dL (ref 70–99)

## 2024-02-29 LAB — C-REACTIVE PROTEIN: CRP: 0.9 mg/dL (ref <1.0)

## 2024-02-29 MED ORDER — POLYETHYLENE GLYCOL 3350 17 G PO PACK: 17.0000 g | PACK | Freq: Every day | ORAL | Status: DC | PRN

## 2024-02-29 MED ORDER — LOSARTAN POTASSIUM 50 MG PO TABS
50.0000 mg | ORAL_TABLET | Freq: Once | ORAL | Status: AC
  Administered 2024-02-29: 50 mg via ORAL
  Filled 2024-02-29: qty 1

## 2024-02-29 MED ORDER — TRAZODONE HCL 50 MG PO TABS
50.0000 mg | ORAL_TABLET | Freq: Every evening | ORAL | Status: DC | PRN
  Administered 2024-02-29 – 2024-03-04 (×5): 50 mg via ORAL
  Filled 2024-02-29 (×5): qty 1

## 2024-02-29 MED ORDER — ROSUVASTATIN CALCIUM 20 MG PO TABS
20.0000 mg | ORAL_TABLET | Freq: Every day | ORAL | Status: DC
  Administered 2024-02-29: 20 mg via ORAL
  Filled 2024-02-29: qty 1

## 2024-02-29 MED ORDER — SODIUM CHLORIDE 0.9% FLUSH
3.0000 mL | Freq: Two times a day (BID) | INTRAVENOUS | Status: DC
  Administered 2024-02-29 – 2024-03-08 (×10): 3 mL via INTRAVENOUS

## 2024-02-29 MED ORDER — RIVAROXABAN 10 MG PO TABS
10.0000 mg | ORAL_TABLET | Freq: Every day | ORAL | Status: DC
  Administered 2024-03-01 – 2024-03-08 (×7): 10 mg via ORAL
  Filled 2024-02-29 (×7): qty 1

## 2024-02-29 MED ORDER — ACETAMINOPHEN 650 MG RE SUPP: 650.0000 mg | Freq: Four times a day (QID) | RECTAL | Status: DC | PRN

## 2024-02-29 MED ORDER — ACETAMINOPHEN 325 MG PO TABS
650.0000 mg | ORAL_TABLET | Freq: Four times a day (QID) | ORAL | Status: DC | PRN
  Administered 2024-03-07: 650 mg via ORAL
  Filled 2024-02-29: qty 2

## 2024-02-29 MED ORDER — INSULIN ASPART 100 UNIT/ML IJ SOLN
0.0000 [IU] | Freq: Three times a day (TID) | INTRAMUSCULAR | Status: DC
  Administered 2024-02-29: 3 [IU] via SUBCUTANEOUS
  Administered 2024-03-01: 1 [IU] via SUBCUTANEOUS
  Administered 2024-03-01 – 2024-03-02 (×2): 2 [IU] via SUBCUTANEOUS
  Administered 2024-03-02 – 2024-03-03 (×3): 1 [IU] via SUBCUTANEOUS
  Administered 2024-03-04: 3 [IU] via SUBCUTANEOUS
  Administered 2024-03-04: 2 [IU] via SUBCUTANEOUS

## 2024-02-29 NOTE — ED Notes (Signed)
 Patient transported to MRI

## 2024-02-29 NOTE — ED Provider Notes (Signed)
 [facility] AT [facility] Provider Note   CSN: [id number] Arrival date & time: 02/29/24  1031     History  Chief Complaint  Patient presents with   Altered Mental Status   Weakness    Clifton Safley. is a 78 y.o. male with a history of polycythemia vera in remission, obstructive sleep apnea, hypertension, presented to the ED with concern for ongoing confusion weakness.  Patient was seen yesterday in the [facility] emergency department, brought in by his wife, with concern for generalized decrease in strength, weakness, emotional swing. In the ED yesterday the patient had a workup including CT head, UA, and basic blood work, with no emergent processes identified.  It was felt that he would likely benefit from placement in SNF for his newfound weakness and inability to ambulate, but they were not able to do this from the ED yesterday and so the patient was taken home.  His wife reports that she knew at the time that she would not be able to care for him at home as she is essentially needing to do full transfers now which is an abrupt change over the past 2 days, and they live alone and she has a bad back.  They called the ambulance to bring him back to the ER today after she spoke to the receptionist at his PCPs office.  She thinks he would benefit from rehab.  On further questioning, his wife is clear to me that she did not have issues of the patient's confusion.  He did have some emotional lability and outbursts in the ER yesterday which she said were unusual for him, but he has been calm since then.  She reports that his confusion completely resolved after the hospitalization in February.  She reports that her concern is only this weakness that developed over the past 2 to 3 days where he is not able to stand, pivot or ambulate on his own anymore, without clear cause.    Of note the patient was admitted to the hospital proxy 1 month ago for reported  encephalopathy.  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  HPI     Home Medications Prior to Admission medications   Medication Sig Start Date End Date Taking? Authorizing Provider  buPROPion (WELLBUTRIN XL) 300 MG 24 hr tablet Take 300 mg by mouth every morning. 07/20/20   [provider]  dasatinib (SPRYCEL) 100 MG tablet Take 1 tablet (100 mg total) by mouth daily. 01/29/24   Josph Macho, MD  desmopressin (DDAVP) 0.2 MG tablet Take 400 mcg by mouth at bedtime. 12/28/21   [provider]  fluticasone (FLONASE) 50 MCG/ACT nasal spray Place 1 spray into both nostrils as needed for allergies. Patient not taking: Reported on 01/28/2024 10/23/19   [provider]  glucose blood test strip OneTouch Ultra Blue Test Strip  USE TO TEST BLOOD SUGAR ONCE DAILY    [provider]  metFORMIN (GLUCOPHAGE-XR) 500 MG 24 hr tablet Take 500 mg by mouth 2 (two) times daily. 11/08/20   [provider]  moxifloxacin (VIGAMOX) 0.5 % ophthalmic solution Place 1 drop into  the right eye See admin instructions. Place 1 drop in right eye QID the day before, day of, and day after treatment. Patient not taking: Reported on 01/28/2024 02/15/18   [provider]  propranolol (INDERAL) 10 MG tablet Take 10 mg by mouth daily as needed (anxiety). 02/26/24   [provider]  rosuvastatin (CRESTOR) 20 MG tablet Take 20 mg by mouth at bedtime. 12/18/22   [provider]  testosterone cypionate (DEPOTESTOSTERONE CYPIONATE) 200 MG/ML injection Inject 200 mg into the muscle every 21 ( twenty-one) days.    [provider]  vitamin B-12 (CYANOCOBALAMIN) 1000 MCG tablet Take 1,000 mcg by mouth daily.    [provider]  XARELTO 10 MG TABS tablet TAKE 1 TABLET BY MOUTH EVERY DAY 09/21/23   Josph Macho, MD      Allergies    Ibuprofen and Nsaids    Review of Systems   Review of Systems  Physical Exam Updated Vital Signs BP (!) 145/101 (BP Location: Left Arm)   Pulse 79   Temp (!) 97.5 F (36.4 C) (Oral)   Resp 18   Ht 5\' 6"  (1.676 m)   Wt 71.7 kg   SpO2 92%   BMI 25.50 kg/m  Physical Exam Constitutional:      General: He is not in acute distress. HENT:     Head: Normocephalic and atraumatic.  Eyes:     Conjunctiva/sclera: Conjunctivae normal.     Pupils: Pupils are equal, round, and reactive to light.  Cardiovascular:     Rate and Rhythm: Normal rate and regular rhythm.  Pulmonary:     Effort: Pulmonary effort is normal. No respiratory distress.  Abdominal:     General: There is no distension.     Tenderness: There is no abdominal tenderness.  Skin:    General: Skin is warm and dry.  Neurological:     General: No focal deficit present.     Mental Status: He is alert. Mental status is at baseline.     Comments: Slow speech, no isolated weakness on motor exam in the bed     ED Results / Procedures / Treatments   Labs (all labs ordered are listed, but only abnormal results are displayed) Labs Reviewed  COMPREHENSIVE METABOLIC PANEL - Abnormal; Notable for the following components:      Result Value   Glucose, Bld 197 (*)    Creatinine, Ser 1.58 (*)    Calcium 11.3 (*)    GFR, Estimated 44 (*)    All other components within normal limits  GLUCOSE, CAPILLARY - Abnormal; Notable for the following components:   Glucose-Capillary 202 (*)    All other components within normal limits  RESP PANEL BY RT-PCR (RSV, FLU A&B, COVID)  RVPGX2  CBC  C-REACTIVE PROTEIN  VITAMIN B12  SEDIMENTATION RATE  COMPREHENSIVE METABOLIC PANEL  CBC     EKG None  Radiology MR BRAIN WO CONTRAST Result Date: 02/29/2024 CLINICAL DATA:  Transient ischemic attack.  Mental status changes. EXAM: MRI HEAD WITHOUT CONTRAST TECHNIQUE: Multiplanar, multiecho pulse sequences of the brain and surrounding structures were obtained without intravenous contrast. COMPARISON:  CT yesterday.  MRI 01/27/2024. FINDINGS: Brain: Diffusion imaging does not show any acute or subacute infarction or other cause of restricted diffusion. No focal abnormality affects the brainstem. Few old small vessel cerebellar infarctions. Cerebral hemispheres show age related volume loss with moderate chronic small-vessel ischemic changes affecting the deep and subcortical white matter. No cortical or large vessel  territory stroke. No mass, acute hemorrhage, hydrocephalus or extra-axial collection. Small focus of hemosiderin deposition in the left cerebellum. Vascular: Major vessels at the base of the brain show flow. Skull and upper cervical spine: Negative Sinuses/Orbits: Clear/normal Other: Bilateral mastoid effusions as present previously. IMPRESSION: 1. No acute finding by MRI. Age related volume loss. Moderate chronic small-vessel ischemic changes of the cerebral hemispheric white matter. Few old small vessel cerebellar infarctions. 2. Bilateral mastoid effusions as present previously. Electronically Signed   By: Paulina Fusi M.D.   On: 02/29/2024 13:06   CT Head Wo Contrast Result Date: 02/28/2024 CLINICAL DATA:  Weakness EXAM: CT HEAD WITHOUT CONTRAST TECHNIQUE: Contiguous axial images were obtained from the base of the skull through the vertex without intravenous contrast. RADIATION DOSE REDUCTION: This exam was performed according to the departmental dose-optimization program which includes automated exposure control, adjustment of the mA and/or kV according to patient size and/or use of iterative reconstruction technique. COMPARISON:  08/06/2024 FINDINGS: Brain: There is no mass,  hemorrhage or extra-axial collection. There is generalized atrophy without lobar predilection. Hypodensity of the white matter is most commonly associated with chronic microvascular disease. Vascular: Tortuous basilar artery, unchanged. Skull: The visualized skull base, calvarium and extracranial soft tissues are normal. Sinuses/Orbits: Small left mastoid effusion. Paranasal sinuses are clear. Normal orbits. Other: None. IMPRESSION: 1. No acute intracranial abnormality. 2. Generalized atrophy and findings of chronic microvascular disease. 3. Small left mastoid effusion. Electronically Signed   By: Deatra Robinson M.D.   On: 02/28/2024 19:12    Procedures Procedures    Medications Ordered in ED Medications  rosuvastatin (CRESTOR) tablet 20 mg (has no administration in time range)  rivaroxaban (XARELTO) tablet 10 mg (has no administration in time range)  sodium chloride flush (NS) 0.9 % injection 3 mL (has no administration in time range)  acetaminophen (TYLENOL) tablet 650 mg (has no administration in time range)    Or  acetaminophen (TYLENOL) suppository 650 mg (has no administration in time range)  polyethylene glycol (MIRALAX / GLYCOLAX) packet 17 g (has no administration in time range)  insulin aspart (novoLOG) injection 0-9 Units (3 Units Subcutaneous Given 02/29/24 1646)  losartan (COZAAR) tablet 50 mg (50 mg Oral Given 02/29/24 1105)    ED Course/ Medical Decision Making/ A&P Clinical Course as of 02/29/24 1751  Fri Feb 29, 2024  1414 Admitted to hospitalist who requested neurology consult for inpatient, will pg neuro [MT]    Clinical Course User Index [MT] Chancellor Vanderloop, Kermit Balo, MD                                 Medical Decision Making Amount and/or Complexity of Data Reviewed Radiology: ordered.  Risk Prescription drug management. Decision regarding hospitalization.   This patient presents to the ED with concern for slow speech, worsening confusion intermittent balance  problems, new weakness. This involves an extensive number of treatment options, and is a complaint that carries with it a high risk of complications and morbidity.  The differential diagnosis includes progressing neurocognitive disease including dementia or Alzheimer's versus TIA or CNS lesion versus metabolic derangement encephalopathy versus medication side effect versus CVS vs other  Co-morbidities that complicate the patient evaluation: History of CML  Additional history obtained from patient's family (wife) at bedside  External records from outside source obtained and reviewed including neurology office evaluation from 1 year ago noting the patient was having "dizzy spells" and getting  a workup, but no report of encephalopathy or report of dementia.  I also reviewed the patient's most recent hospital discharge summary from February as well as EEG and MRI performed and patient  The patient had a battery of labs form yesterday.  I reviewed these labs from the ED with no leukocytosis or white blood cell count derangement, no significant anemia, UA without sign of infection.  Ammonia level also within normal limits.  I do not see an indication to repeat those labs at this time.  He is noted to have chronic kidney disease with creatinine near baseline at 1.4.  No other significant electrolyte derangement although there is some hypercalcemia with calcium 11.3 (I suspect this level is unlikely to be a cause of his confusion).  I think an MRI of the brain is reasonable to evaluate for cerebellar posterior stroke or lesion given his report of new difficulty with ambulation which appears to be a combination of ataxia and generalized weakness.  COVID and flu test are negative today.  No recent infectious symptoms.    I ordered imaging studies including MRI of the brain I independently visualized and interpreted imaging which showed no acute findings I agree with the radiologist interpretation  I ordered  medication including losartan 50 mg for HTN (lower dose re-initiated)  I have reviewed the patients home medicines and have made adjustments as needed  Test Considered: Lower suspicion for acute meningitis or demyelinating disorder requiring lumbar puncture at this time  I requested consultation with the neurology,  and discussed lab and imaging findings as well as pertinent plan - they recommend: consult pending upon admission  After the interventions noted above, I reevaluated the patient and found that they have: stayed the same   Dispostion:  After consideration of the diagnostic results and the patients response to treatment, I feel that the patent would benefit from medical admission         Final Clinical Impression(s) / ED Diagnoses Final diagnoses:  Weakness    Rx / DC Orders ED Discharge Orders     None         Terald Sleeper, MD 02/29/24 1751

## 2024-02-29 NOTE — H&P (Signed)
 History and Physical   James [name]. WGN:[id number] DOB: 06/24/1946 DOA: 02/29/2024  PCP: [name], MD   Patient coming from: Home  Chief Complaint: Weakness  HPI: James [name]. is a 78 y.o. male with medical history significant of hypertension, hyperlipidemia, diabetes, GERD, splenic infarct, portal vein thrombosis, polycythemia vera, OSA, NASH cirrhosis, anemia, CML, anxiety presenting with worsening weakness.  Patient was admitted 2/8 until 2/10 with altered mental status, dizziness, unsteady gait.  Thought that this could be secondary to his Sprycel which he had been on for his CML.  This was discontinued 2 days prior to his presentation.  His confusion resolved and has not returned since that time.  However, he has had worsening generalized weakness especially with his gait and transferring for the last 2 or 3 days.  Wife can no longer help care for him and patient is needing placement.  Initially was seen at med center yesterday and came to the hospital again today for assistance in getting him placed in for further evaluation of this weakness.  Denies fevers, chills, chest pain, shortness breath, abdominal pain, constipation, diarrhea, nausea, vomiting.  ED Course: Vital signs in the ED notable for blood pressure in the 140s to 170s systolic.  Only lab work done today was respiratory panel which was negative for flu COVID and RSV.  Yesterday had lab workup included CMP with sodium 134, BUN 27, creatinine stable 1.43, glucose 107, calcium stable 11.3.  CBC within normal limits.  CT head yesterday normal.  MRI brain today normal.  Urinalysis yesterday showed protein and rare bacteria.  Ammonia yesterday normal.  Admission requested given the acute onset of this weakness likely benefiting from further evaluation versus simple placement from the ED.  Have requested EDP speak with neurology for any additional input or recommendations on labs or other workup given this acute  change.  Review of Systems: As per HPI otherwise all other systems reviewed and are negative.  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)    History   Portal vein thrombosis    Rhinitis    Situational depression    Sleep apnea    uses CPAP every night   Tubular adenoma 12/10/2015   6 cecum polyps    Past Surgical History:  Procedure Laterality Date   CARDIAC CATHETERIZATION     greater 10 yrs ago, normal   CARPAL TUNNEL RELEASE     bilateral   COLONOSCOPY  06/2005   [facility]   ESOPHAGOGASTRODUODENOSCOPY (EGD) WITH PROPOFOL N/A 09/28/2016   Procedure: ESOPHAGOGASTRODUODENOSCOPY (EGD) WITH PROPOFOL;  Surgeon: Iva Boop, MD;  Location: [facility];  Service: Endoscopy;  Laterality: N/A;   IR GENERIC HISTORICAL  12/12/2016   IR US GUIDE VASC ACCESS RIGHT 12/12/2016 James Click, MD [facility]   IR [facility]  HISTORICAL  12/12/2016   IR VENOGRAM HEPATIC W HEMODYNAMIC EVALUATION 12/12/2016 James Click, MD [facility]   IR GENERIC HISTORICAL  12/12/2016   IR TRANSCATHETER BX 12/12/2016 James Click, MD [facility]   REVERSE SHOULDER ARTHROPLASTY Right 09/01/2021   Procedure: REVERSE SHOULDER ARTHROPLASTY;  Surgeon: Francena Hanly, MD;  Location: [facility];  Service: Orthopedics;  Laterality: Right;  ,   ROTATOR CUFF REPAIR     right   TENDON REPAIR     left arm   TONSILLECTOMY     TOTAL KNEE ARTHROPLASTY Right 2010    WISDOM TOOTH EXTRACTION      Social History  reports that he quit smoking about 50 years ago. His smoking use included pipe. He has never used smokeless tobacco. He reports current alcohol use. He reports that he does not use drugs.  Allergies  Allergen Reactions   Ibuprofen Anaphylaxis and Other (See Comments)    Upper GI Bled   Nsaids Other (See Comments)    GI Bleed    Family History  Problem Relation Age of Onset   Diabetes Mother        history   Stroke Mother        history   Lung disease Father        history   Bipolar disorder Son        committed suicide   Colon cancer Other    Heart failure Other    Heart attack Other    Colon polyps Neg Hx    Rectal cancer Neg Hx    Stomach cancer Neg Hx    Esophageal cancer Neg Hx    Sleep apnea Neg Hx   Review of admission  Prior to Admission medications   Medication Sig Start Date End Date Taking? Authorizing Provider  buPROPion (WELLBUTRIN XL) 300 MG 24 hr tablet Take 300 mg by mouth every morning. 07/20/20   [provider]  dasatinib (SPRYCEL) 100 MG tablet Take 1 tablet (100 mg total) by mouth daily. 01/29/24   Josph Macho, MD  desmopressin (DDAVP) 0.2 MG tablet Take 400 mcg by mouth at bedtime. 12/28/21   [provider]  fluticasone (FLONASE) 50 MCG/ACT nasal spray Place 1 spray into both nostrils as needed for allergies. Patient not taking: Reported on 01/28/2024 10/23/19   [provider]  glucose blood test strip OneTouch Ultra Blue Test Strip  USE TO TEST BLOOD SUGAR ONCE DAILY    [provider]  metFORMIN (GLUCOPHAGE-XR) 500 MG 24 hr tablet Take 500 mg by mouth 2 (two) times daily. 11/08/20   [provider]  moxifloxacin (VIGAMOX) 0.5 % ophthalmic solution Place 1 drop into the right eye See admin instructions. Place 1 drop in right eye QID the day before, day of, and day after treatment. Patient not taking: Reported on 01/28/2024 02/15/18   [provider]   rosuvastatin (CRESTOR) 20 MG tablet Take 20 mg by mouth at bedtime. 12/18/22   [provider]  testosterone cypionate (DEPOTESTOSTERONE CYPIONATE) 200 MG/ML injection Inject 200 mg into the muscle every 21 ( twenty-one) days.    [provider]  vitamin B-12 (CYANOCOBALAMIN) 1000 MCG tablet Take 1,000 mcg by mouth daily.    [provider]  XARELTO 10 MG TABS tablet TAKE 1 TABLET BY MOUTH EVERY DAY 09/21/23   Josph Macho, MD    Physical Exam: Vitals:   02/29/24 1038 02/29/24 1100 02/29/24 1113 02/29/24 1130  BP:  (!) 179/104 (!) 158/100 [name])  173/97  Pulse:  72 71 74  Resp:   18 18  Temp:      SpO2:  98% 97% 96%  Weight: 71.7 kg     Height: 5\' 6"  (1.676 m)       Physical Exam Constitutional:      General: He is not in acute distress.    Appearance: Normal appearance.  HENT:     Head: Normocephalic and atraumatic.     Mouth/Throat:     Mouth: Mucous membranes are moist.     Pharynx: Oropharynx is clear.  Eyes:     Extraocular Movements: Extraocular movements intact.     Pupils: Pupils are equal, round, and reactive to light.  Cardiovascular:     Rate and Rhythm: Normal rate and regular rhythm.     Pulses: Normal pulses.     Heart sounds: Normal heart sounds.  Pulmonary:     Effort: Pulmonary effort is normal. No respiratory distress.     Breath sounds: Normal breath sounds.  Abdominal:     General: Bowel sounds are normal. There is no distension.     Palpations: Abdomen is soft.     Tenderness: There is no abdominal tenderness.  Musculoskeletal:        General: No swelling or deformity.  Skin:    General: Skin is warm and dry.  Neurological:     General: No focal deficit present.     Mental Status: Mental status is at baseline.     Comments: Alert and oriented.  Some mild intermittent confusion with recall. Strength and sensation grossly intact bilateral upper and lower extremity swelling a bit. Did not get patient up to ambulate which is  when his symptoms are reported to occur more severely as we were in the hallway without assistance.    Labs on Admission: I have personally reviewed following labs and imaging studies  CBC: Recent Labs  Lab 02/28/24 1540  WBC 5.9  HGB 15.9  HCT 49.6  MCV 92.4  PLT 280    Basic Metabolic Panel: Recent Labs  Lab 02/28/24 1540  NA 134*  K 4.4  CL 106  CO2 23  GLUCOSE 107*  BUN 27*  CREATININE 1.43*  CALCIUM 11.3*    GFR: Estimated Creatinine Clearance: 38.4 mL/min (A) (by C-G formula based on SCr of 1.43 mg/dL (H)).  Liver Function Tests: Recent Labs  Lab 02/28/24 1540  AST 19  ALT 16  ALKPHOS 76  BILITOT 0.7  PROT 7.0  ALBUMIN 4.1    Urine analysis:    Component Value Date/Time   COLORURINE YELLOW 02/28/2024 1540   APPEARANCEUR CLEAR 02/28/2024 1540   LABSPEC 1.025 02/28/2024 1540   PHURINE 6.0 02/28/2024 1540   GLUCOSEU NEGATIVE 02/28/2024 1540   HGBUR NEGATIVE 02/28/2024 1540   BILIRUBINUR NEGATIVE 02/28/2024 1540   KETONESUR NEGATIVE 02/28/2024 1540   PROTEINUR 30 (A) 02/28/2024 1540   UROBILINOGEN 1.0 04/24/2022 0917   NITRITE NEGATIVE 02/28/2024 1540   LEUKOCYTESUR NEGATIVE 02/28/2024 1540    Radiological Exams on Admission: MR BRAIN WO CONTRAST Result Date: 02/29/2024 CLINICAL DATA:  Transient ischemic attack.  Mental status changes. EXAM: MRI HEAD WITHOUT CONTRAST TECHNIQUE: Multiplanar, multiecho pulse sequences of the brain and surrounding structures were obtained without intravenous contrast. COMPARISON:  CT yesterday.  MRI 01/27/2024. FINDINGS: Brain: Diffusion imaging does not show any acute or subacute infarction or other cause of restricted diffusion. No focal abnormality affects the brainstem. Few old small vessel cerebellar infarctions. Cerebral hemispheres show  age related volume loss with moderate chronic small-vessel ischemic changes affecting the deep and subcortical white matter. No cortical or large vessel territory stroke. No mass,  acute hemorrhage, hydrocephalus or extra-axial collection. Small focus of hemosiderin deposition in the left cerebellum. Vascular: Major vessels at the base of the brain show flow. Skull and upper cervical spine: Negative Sinuses/Orbits: Clear/normal Other: Bilateral mastoid effusions as present previously. IMPRESSION: 1. No acute finding by MRI. Age related volume loss. Moderate chronic small-vessel ischemic changes of the cerebral hemispheric white matter. Few old small vessel cerebellar infarctions. 2. Bilateral mastoid effusions as present previously. Electronically Signed   By: Paulina Fusi M.D.   On: 02/29/2024 13:06   CT Head Wo Contrast Result Date: 02/28/2024 CLINICAL DATA:  Weakness EXAM: CT HEAD WITHOUT CONTRAST TECHNIQUE: Contiguous axial images were obtained from the base of the skull through the vertex without intravenous contrast. RADIATION DOSE REDUCTION: This exam was performed according to the departmental dose-optimization program which includes automated exposure control, adjustment of the mA and/or kV according to patient size and/or use of iterative reconstruction technique. COMPARISON:  08/06/2024 FINDINGS: Brain: There is no mass, hemorrhage or extra-axial collection. There is generalized atrophy without lobar predilection. Hypodensity of the white matter is most commonly associated with chronic microvascular disease. Vascular: Tortuous basilar artery, unchanged. Skull: The visualized skull base, calvarium and extracranial soft tissues are normal. Sinuses/Orbits: Small left mastoid effusion. Paranasal sinuses are clear. Normal orbits. Other: None. IMPRESSION: 1. No acute intracranial abnormality. 2. Generalized atrophy and findings of chronic microvascular disease. 3. Small left mastoid effusion. Electronically Signed   By: Deatra Robinson M.D.   On: 02/28/2024 19:12   EKG: Independently reviewed. Last EKG was done yesterday and showed sinus rhythm at 75 bpm.  Nonspecific T wave  flattening.  Low voltage multiple leads.  Some baseline wander.  Assessment/Plan Principal Problem:   Gait abnormality Active Problems:   Polycythemia vera (HCC)   HTN (hypertension)   Hyperlipidemia   Portal vein thrombosis   DM (diabetes mellitus) (HCC)   Liver cirrhosis secondary to NASH (HCC)   Iron deficiency anemia due to chronic blood loss   OSA on CPAP   CML (chronic myelocytic leukemia) (HCC)   Type 2 diabetes mellitus with hyperglycemia (HCC)   Anxiety   GERD (gastroesophageal reflux disease)   Gait abnormality Weakness > Acute onset of gait abnormality with weakness and walking needing significant help with transfers.  Unclear if this is girdle weakness or other.  No reported pain. > Had some issues with his gait along with confusion a month ago which resolved.  Confusion has not returned but this weakness has been acute onset in the last 2 to 3 days. > [facility] and was unable to be placed and so was sent home and then presented again today and given acute onset ED provider has requested admission for additional evaluation rather than placement directly from ED which is reasonable. > Negative CT head and MRI brain.  No evidence of > EDP is consulting urology for additional input. - Monitor on telemetry overnight - Appreciate any input from neurology - Hold home statin - B12, ESR, CRP - PT/OT/TOC  Hypertension - Confirm medication and  resume as appropriate  Hyperlipidemia - Hold home statin  Diabetes - SSI  History of splenic infarct - Continue home Xarelto  Polycythemia vera CML > Followed by oncology.  Was on Sprycel with good response and at last eval in February was considered to be  in remission.  Sprycel was discontinued. - Noted  NASH cirrhosis > Yesterday LFTs stable, platelets stable - Trend CMP  OSA - Continue home CPAP   DVT prophylaxis: Xarelto Code Status:   DNR, okay with intubation and no other  interventions. Family Communication:  Updated at bedside Disposition Plan:   Patient is from:  Home  Anticipated DC to:  SNF  Anticipated DC date:  2 to 3 days  Anticipated DC barriers: Placement  Consults called:  Neurology Admission status:  Inpatient, telemetry  Severity of Illness: The appropriate patient status for this patient is INPATIENT. Inpatient status is judged to be reasonable and necessary in order to provide the required intensity of service to ensure the patient's safety. The patient's presenting symptoms, physical exam findings, and initial radiographic and laboratory data in the context of their chronic comorbidities is felt to place them at high risk for further clinical deterioration. Furthermore, it is not anticipated that the patient will be medically stable for discharge from the hospital within 2 midnights of admission.   * I certify that at the point of admission it is my clinical judgment that the patient will require inpatient hospital care spanning beyond 2 midnights from the point of admission due to high intensity of service, high risk for further deterioration and high frequency of surveillance required.Synetta Fail MD [facility]  How to contact the [facility] Attending or Consulting provider 7A - 7P or covering provider during after hours 7P -7A, for this patient?   Check the care team in [facility] and look for a) attending/consulting [facility] provider listed and b) the [facility] team listed Log into [email or web address] and use [place] universal password to access. If you do not have the password, please contact the hospital operator. Locate the [facility] provider you are looking for under [facility] and page to a number that you can be directly reached. If you still have difficulty reaching the provider, please page the [facility] (Director on Call) for the Hospitalists listed on amion for assistance.  02/29/2024, 2:21 PM

## 2024-02-29 NOTE — Plan of Care (Signed)
  Problem: Pain Managment: Goal: General experience of comfort will improve and/or be controlled Outcome: Progressing   Problem: Coping: Goal: Ability to adjust to condition or change in health will improve Outcome: Progressing

## 2024-02-29 NOTE — Consult Note (Signed)
 NEUROLOGY CONSULT NOTE   Date of service: February 29, 2024 Patient Name: James [name]. MRN:  [id number] DOB:  09/24/1946 Chief Complaint: "weakness" Requesting Provider: Synetta Fail, MD  History of Present Illness  James [name]. is a 78 y.o. male with hx of HTN, HLD, DM, portal vein thrombosis, polycythemia vera, OSA, anemia, NASH cirrhosis, anxiety, CML who presented to ED 3/14 due to worsening weakness.  Patient states he has had worsening generalized weakness especially with his gait and transferring for last 2 to 3 days.   On exam, patient is awake, oriented to self and place only.  He is moving all extremities with no focal deficits.  He does have some intermittent expressive aphasia, slow responses and is forgetful but redirectable.  Wife had stated yesterday that the weakness has gotten progressively worse over the past week and that he was unable to get up by himself on Thursday night and she was unable to help him.  Patient and wife denied that this has ever happened before.Patient states that it feels like he's going to fall when he tries to get up and also that he's not strong enough. He usually uses a walker at home with no problem.   He was admitted 2/8 through 2/10 with altered mental status, dizziness, unsteady gait which was thought to be secondary to his Sprycel medication that he is on for his CML.  This was discontinued and his confusion was noted as resolved.Ongoing symptoms were noted for the last 3 years by patient that present intermittently. These symptoms included progressive weakness, deconditioning, increased forgetfulness. MRI negative, EEG negative.  ED visit 12/2023 for weakness, not admitted.  Multiple ED visits s/p falls.  Admission 09/02/2021: "Wife reports that tonight he began acting confused and was intermittently agitated". Metabolic encephalopathy d/t aspiration pneumonia dn narcotic pain medication.    PT/OT eval on 2/10 noted patient has had a  recent decline in functional status, needed assistance with transfers and required contact guard assistance. He was noted to be unsteady by therapist, and that patient reported that was his baseline after knee surgery. It was also noted that patient has some cognitive deficits per son. OT notes state that patient falls at least once a week, is impulsive and unsafe and needs assistance from wife to complete his ADLs. Noted to have word-finding difficulties that son states had been there for a while but were getting worse. SLP eval recommended, do not see follow-up documented in chart.      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)    History   Portal vein thrombosis    Rhinitis    Situational depression    Sleep apnea    uses CPAP every night  Tubular adenoma 12/10/2015   6 cecum polyps    Past Surgical History:  Procedure Laterality Date   CARDIAC CATHETERIZATION     greater 10 yrs ago, normal   CARPAL TUNNEL RELEASE     bilateral   COLONOSCOPY  06/2005   [facility] [place]   ESOPHAGOGASTRODUODENOSCOPY (EGD) WITH PROPOFOL N/A 09/28/2016   Procedure: ESOPHAGOGASTRODUODENOSCOPY (EGD) WITH PROPOFOL;  Surgeon: Iva Boop, MD;  Location: [facility];  Service: Endoscopy;  Laterality: N/A;   IR GENERIC HISTORICAL  12/12/2016   IR US GUIDE VASC ACCESS  RIGHT 12/12/2016 Jolaine Click, MD [facility]   IR GENERIC HISTORICAL  12/12/2016   IR VENOGRAM HEPATIC W HEMODYNAMIC EVALUATION 12/12/2016 Jolaine Click, MD [facility]   IR GENERIC HISTORICAL  12/12/2016   IR TRANSCATHETER BX 12/12/2016 Jolaine Click, MD [facility]   REVERSE SHOULDER ARTHROPLASTY Right 09/01/2021   Procedure: REVERSE SHOULDER ARTHROPLASTY;  Surgeon: Francena Hanly, MD;  Location: [facility];  Service: Orthopedics;  Laterality: Right;  ,   ROTATOR CUFF REPAIR     right   TENDON REPAIR     left arm   TONSILLECTOMY     TOTAL KNEE ARTHROPLASTY Right 2010   WISDOM TOOTH EXTRACTION      Family History: Family History  Problem Relation Age of Onset   Diabetes Mother        history   Stroke Mother        history   Lung disease Father        history   Bipolar disorder Son        committed suicide   Colon cancer Other    Heart failure Other    Heart attack Other    Colon polyps Neg Hx    Rectal cancer Neg Hx    Stomach cancer Neg Hx    Esophageal cancer Neg Hx    Sleep apnea Neg Hx     Social History  reports that he quit smoking about 50 years ago. His smoking use included pipe. He has never used smokeless tobacco. He reports current alcohol use. He reports that he does not use drugs.  Allergies  Allergen Reactions   Ibuprofen Anaphylaxis and Other (See Comments)    Upper GI Bled   Nsaids Other (See Comments)    GI Bleed    Medications   Current Facility-Administered Medications:    acetaminophen (TYLENOL) tablet 650 mg, 650 mg, Oral, Q6H PRN **OR** acetaminophen (TYLENOL) suppository 650 mg, 650 mg, Rectal, Q6H PRN, Synetta Fail, MD   insulin aspart (novoLOG) injection 0-9 Units, 0-9 Units, Subcutaneous, TID WC, Synetta Fail, MD, 3 Units at 02/29/24 1646   polyethylene glycol (MIRALAX / GLYCOLAX) packet 17 g, 17 g, Oral, Daily PRN, Synetta Fail, MD   [START ON 03/01/2024] rivaroxaban (XARELTO) tablet 10 mg, 10 mg, Oral, Daily,  Synetta Fail, MD   rosuvastatin (CRESTOR) tablet 20 mg, 20 mg, Oral, QHS, Melvin, Alexander B, MD   sodium chloride flush (NS) 0.9 % injection 3 mL, 3 mL, Intravenous, Q12H, Synetta Fail, MD  Vitals   Vitals:   02/29/24 1113 02/29/24 1130 02/29/24 1426 02/29/24 1532  BP: (!) 158/100 (!) 173/97 (!) 150/83 (!) 145/101  Pulse: 71 74 76 79  Resp: 18 18 18 18   Temp:   97.9 F (36.6 C) (!) 97.5 F (36.4 C)  TempSrc:   Oral Oral  SpO2: 97% 96% 97% 92%  Weight:      Height:  Body mass index is 25.5 kg/m.  Physical Exam   Constitutional: Appears well-developed and well-nourished.  Psych: Affect appropriate to situation. Calm and cooperative. Eyes: No scleral injection.  HENT: No OP obstruction.  Head: Normocephalic.  Cardiovascular: Normal rate and regular rhythm.  Respiratory: Effort normal, non-labored breathing.  GI: Soft.  No distension. There is no tenderness.  Skin: WDI.   Neurologic Examination   Neuro: Mental Status: Patient is awake, alert, oriented to person, place, month, year, and situation. Patient is able to give a clear and coherent history. No signs of dysarthria. Intermittent expressive aphasia and slow responses.  Good attention with some forgetfulness, redirectable.  Cranial Nerves: II: Visual Fields are full. Pupils are equal, round, and reactive to light.   III,IV, VI: EOMI without ptosis or diploplia.  V: Facial sensation is symmetric to light touch VII: Facial movement is symmetric.  VIII: hearing is intact to voice X: Uvula elevates symmetrically XI: Shoulder shrug is symmetric. XII: tongue is midline  Motor: Tone is normal. Bulk is normal. 5/5 strength was present in all four extremities.  Strong grips bilaterally. Strong dorsal and plantar flexion bilaterally.  Sensory: Sensation is symmetric to light touch in the arms and legs. Cerebellar: FNF and HKS are intact bilaterally.   Labs/Imaging/Neurodiagnostic studies    CBC:  Recent Labs  Lab 02/28/24 1540  WBC 5.9  HGB 15.9  HCT 49.6  MCV 92.4  PLT 280   Basic Metabolic Panel:  Lab Results  Component Value Date   NA 134 (L) 02/28/2024   K 4.4 02/28/2024   CO2 23 02/28/2024   GLUCOSE 107 (H) 02/28/2024   BUN 27 (H) 02/28/2024   CREATININE 1.43 (H) 02/28/2024   CALCIUM 11.3 (H) 02/28/2024   GFRNONAA 50 (L) 02/28/2024   GFRAA 53 (L) 09/02/2020   Lipid Panel:  Lab Results  Component Value Date   LDLCALC 31 01/28/2024   HgbA1c:  Lab Results  Component Value Date   HGBA1C 6.6 (H) 01/27/2024   Urine Drug Screen:     Component Value Date/Time   LABOPIA NONE DETECTED 09/02/2021 0310   COCAINSCRNUR NONE DETECTED 09/02/2021 0310   LABBENZ NONE DETECTED 09/02/2021 0310   AMPHETMU NONE DETECTED 09/02/2021 0310   THCU NONE DETECTED 09/02/2021 0310   LABBARB NONE DETECTED 09/02/2021 0310    Alcohol Level No results found for: "ETH" INR  Lab Results  Component Value Date   INR 1.6 (H) 09/02/2021   APTT  Lab Results  Component Value Date   APTT 32 09/02/2021   Dementia Panel labs: Sed Rate, B12, TSH, Folate WNL. RPR Pending.   CT Head without contrast(Personally reviewed): 1. No acute intracranial abnormality. 2. Generalized atrophy and findings of chronic microvascular disease.  MRI Brain(Personally reviewed):  No acute finding by MRI. Age related volume loss. Moderate chronic small-vessel ischemic changes of the cerebral hemispheric white matter. Few old small vessel cerebellar infarctions   ASSESSMENT   James [name]. is a 78 y.o. male with hx of CML, HTN, HLD, DM, portal vein thrombosis, polycythemia vera, OSA, anemia, NASH cirrhosis, anxiety who presented to ED 3/14 due to  worsening generalized weakness, especially with his gait and transferring for 2 to 3 days.   On chart review, weakness and confusion has been noted as an issue for up to 3 years including hospitalizations and PT/OT evaluations. When I spoke to the  wife, it seems that she is concerned that patient is becoming more than she is able to care  for at home and is need of assistance with possible placement for him. Ongoing symptoms were noted for the last 3 years by patient that present intermittently. These symptoms included progressive weakness, deconditioning, increased forgetfulness.  Symptoms present as these same descriptions on this admission.   Patient is on DDAVP at home regularly. This can cause low sodium, which can cause weakness. His Na was borderline low only when admitted, so don't think this is the issue. Anemia not seen on labs this visit, but will check Ferritin and Iron labs for Iron deficiency without anemia as this could be a contributing factor to his overall weakness as well.  Vitamin D is low and this can certainly cause overall weakness and fatigue. Ammonia is elevated which can contribute to patient's confusion and disorientation.  It also seems that wife is in need of assistance with caring for the patient and he will likely need placement or home health.   Impression: Progressive generalized weakness in patient with chronic disease exacerbated by vitamin D deficiency, continued deconditioning.    RECOMMENDATIONS    - Additional labs: Vitamin D, Thiamine, Ammonia  Vitamin D is low, Ammonia elevated, corrections as you are  - Continue metabolic corrections - Avoid dehydration - PT/OT - Patient will likely need placement for the safety of him and his wife - I will refer to outpatient neurology for further evaluation including cognitive testing and mgmt of his imbalance  Neurology will sign off, please re-engage if additional neurologic concerns arise. ______________________________________________________________________   Pt seen by Neuro NP/APP and later by MD. Note/plan to be edited by MD as needed.    Lynnae January, DNP, AGACNP-BC [facility] Please use AMION for contact information & EPIC for  messaging.

## 2024-02-29 NOTE — Plan of Care (Signed)
  Problem: Clinical Measurements: Goal: Ability to maintain clinical measurements within normal limits will improve Outcome: Progressing   Problem: Activity: Goal: Risk for activity intolerance will decrease Outcome: Progressing   Problem: Nutrition: Goal: Adequate nutrition will be maintained Outcome: Progressing   Problem: Elimination: Goal: Will not experience complications related to bowel motility Outcome: Progressing Goal: Will not experience complications related to urinary retention Outcome: Progressing   Problem: Safety: Goal: Ability to remain free from injury will improve Outcome: Progressing   Problem: Metabolic: Goal: Ability to maintain appropriate glucose levels will improve Outcome: Progressing

## 2024-02-29 NOTE — ED Triage Notes (Signed)
 Pt presents to ED from home with intermittent confusion for the last month, has follow-up with primary to potentially diagnose with dementia. Generalized weakness and trouble getting around for the last 3-4 day. Seen at [facility]. Wife wants him placed in a rehab or care home. Called primary care doctor but he was not available today so she spoke with another doctor at the office and he told the wife to send him to the hospital for placement. Able to answer orientation questions appropriately approx 70 percent of the time per EMS. Oriented to person, place, and time.  186bg Vitals wnl.

## 2024-02-29 NOTE — Progress Notes (Signed)
   02/29/24 2022  BiPAP/CPAP/SIPAP  BiPAP/CPAP/SIPAP Pt Type Adult  Reason BIPAP/CPAP not in use Non-compliant  BiPAP/CPAP /SiPAP Vitals  Pulse Rate 86  Resp 18  SpO2 94 %  Bilateral Breath Sounds Clear  MEWS Score/Color  MEWS Score 0  MEWS Score Color [place] Si

## 2024-03-01 DIAGNOSIS — R2689 Other abnormalities of gait and mobility: Secondary | ICD-10-CM

## 2024-03-01 DIAGNOSIS — E0800 Diabetes mellitus due to underlying condition with hyperosmolarity without nonketotic hyperglycemic-hyperosmolar coma (NKHHC): Secondary | ICD-10-CM

## 2024-03-01 DIAGNOSIS — R531 Weakness: Secondary | ICD-10-CM | POA: Diagnosis not present

## 2024-03-01 DIAGNOSIS — R410 Disorientation, unspecified: Secondary | ICD-10-CM

## 2024-03-01 DIAGNOSIS — R269 Unspecified abnormalities of gait and mobility: Secondary | ICD-10-CM | POA: Diagnosis not present

## 2024-03-01 DIAGNOSIS — E559 Vitamin D deficiency, unspecified: Secondary | ICD-10-CM

## 2024-03-01 DIAGNOSIS — F419 Anxiety disorder, unspecified: Secondary | ICD-10-CM | POA: Diagnosis not present

## 2024-03-01 DIAGNOSIS — C9210 Chronic myeloid leukemia, BCR/ABL-positive, not having achieved remission: Secondary | ICD-10-CM | POA: Diagnosis not present

## 2024-03-01 LAB — GLUCOSE, CAPILLARY
Glucose-Capillary: 128 mg/dL — ABNORMAL HIGH (ref 70–99)
Glucose-Capillary: 125 mg/dL — ABNORMAL HIGH (ref 70–99)
Glucose-Capillary: 165 mg/dL — ABNORMAL HIGH (ref 70–99)
Glucose-Capillary: 120 mg/dL — ABNORMAL HIGH (ref 70–99)
Glucose-Capillary: 219 mg/dL — ABNORMAL HIGH (ref 70–99)

## 2024-03-01 LAB — COMPREHENSIVE METABOLIC PANEL
ALT: 11 U/L (ref 0–44)
AST: 20 U/L (ref 15–41)
Albumin: 3.8 g/dL (ref 3.5–5.0)
Alkaline Phosphatase: 73 U/L (ref 38–126)
Anion gap: 6 (ref 5–15)
BUN: 26 mg/dL — ABNORMAL HIGH (ref 8–23)
CO2: 22 mmol/L (ref 22–32)
Calcium: 11.7 mg/dL — ABNORMAL HIGH (ref 8.9–10.3)
Chloride: 110 mmol/L (ref 98–111)
Creatinine, Ser: 1.67 mg/dL — ABNORMAL HIGH (ref 0.61–1.24)
GFR, Estimated: 42 mL/min — ABNORMAL LOW (ref >60)
Glucose, Bld: 113 mg/dL — ABNORMAL HIGH (ref 70–99)
Potassium: 4.1 mmol/L (ref 3.5–5.1)
Sodium: 138 mmol/L (ref 135–145)
Total Bilirubin: 0.8 mg/dL (ref 0.0–1.2)
Total Protein: 6.4 g/dL — ABNORMAL LOW (ref 6.5–8.1)

## 2024-03-01 LAB — FERRITIN: Ferritin: 15 ng/mL — ABNORMAL LOW (ref 24–336)

## 2024-03-01 LAB — CBC
HCT: 52 % (ref 39.0–52.0)
Hemoglobin: 17.2 g/dL — ABNORMAL HIGH (ref 13.0–17.0)
MCH: 30.3 pg (ref 26.0–34.0)
MCHC: 33.1 g/dL (ref 30.0–36.0)
MCV: 91.5 fL (ref 80.0–100.0)
Platelets: 259 10*3/uL (ref 150–400)
RBC: 5.68 MIL/uL (ref 4.22–5.81)
RDW: 14.8 % (ref 11.5–15.5)
WBC: 5.6 10*3/uL (ref 4.0–10.5)
nRBC: 0 % (ref 0.0–0.2)

## 2024-03-01 LAB — RPR: RPR Ser Ql: NONREACTIVE

## 2024-03-01 LAB — IRON AND TIBC
Iron: 38 ug/dL — ABNORMAL LOW (ref 45–182)
Saturation Ratios: 10 % — ABNORMAL LOW (ref 17.9–39.5)
TIBC: 399 ug/dL (ref 250–450)
UIBC: 361 ug/dL

## 2024-03-01 MED ORDER — LACTATED RINGERS IV SOLN: INTRAVENOUS | Status: AC

## 2024-03-01 MED ORDER — VITAMIN D 25 MCG (1000 UNIT) PO TABS
1000.0000 [IU] | ORAL_TABLET | Freq: Every day | ORAL | Status: DC
  Administered 2024-03-01 – 2024-03-04 (×4): 1000 [IU] via ORAL
  Filled 2024-03-01 (×4): qty 1

## 2024-03-01 NOTE — Plan of Care (Signed)
  Problem: Acute Rehab PT Goals(only PT should resolve) Goal: Pt Will Go Supine/Side To Sit Flowsheets (Taken 03/01/2024 1427) Pt will go Supine/Side to Sit: with minimal assist Goal: Pt Will Go Sit To Supine/Side Flowsheets (Taken 03/01/2024 1427) Pt will go Sit to Supine/Side: with contact guard assist Goal: Patient Will Transfer Sit To/From Stand Flowsheets (Taken 03/01/2024 1428) Patient will transfer sit to/from stand: with minimal assist Goal: Pt Will Ambulate Flowsheets (Taken 03/01/2024 1428) Pt will Ambulate:  75 feet  with minimal assist  with rolling walker

## 2024-03-01 NOTE — Evaluation (Signed)
 Physical Therapy Evaluation Patient Details Name: James [name]. MRN: [id number] DOB: Aug 03, 1946 Today's Date: 03/01/2024  History of Present Illness  78 y/o male presents to [facility] on 3/14 with progressive weakness and confusion. Patient was recently admitted 2/8 until 2/10 with altered mental status, dizziness, unsteady gait. PMH includes: hypertension, hyperlipidemia, diabetes, GERD, splenic infarct, portal vein thrombosis, polycythemia vera, OSA, NASH cirrhosis, anemia, CML, anxiety  Clinical Impression  Patient lives at home with his wife and is independent with most ADLs and Mod I using a R at home. Patient's wife unable to offer additional support at home. Patient presents with cognitive deficits, impaired strength and ROM, and decreased safety awareness. Patient requires Min A for bed mobility and ambulation, and Mod A for transfers. When ambulating 71ft within the unit, patient demonstrates decreased safety awareness and knowledge using a RW. Patient needs constant cueing to limit the distance between himself and the RW, in addition is a limited household ambulator due to his shuffle gait and the limited ability to clear both feet. Patient is a high fall risk and would benefit rom continued inpatient follow up therapy, <3 hours/day to maximize his functional level of independence. Acute PT will continue to follow-up. Thank you for this consult.      If plan is discharge home, recommend the following: A little help with walking and/or transfers;A little help with bathing/dressing/bathroom;Assistance with cooking/housework;Direct supervision/assist for medications management;Direct supervision/assist for financial management;Assist for transportation;Help with stairs or ramp for entrance;Supervision due to cognitive status   Can travel by private vehicle   Yes    Equipment Recommendations None recommended by PT  Recommendations for Other Services       Functional Status Assessment Patient  has had a recent decline in their functional status and demonstrates the ability to make significant improvements in function in a reasonable and predictable amount of time.     Precautions / Restrictions Precautions Precautions: Fall Recall of Precautions/Restrictions: Impaired Restrictions Weight Bearing Restrictions Per Provider Order: No      Mobility  Bed Mobility Overal bed mobility: Needs Assistance Bed Mobility: Supine to Sit, Sit to Supine     Supine to sit: Min assist Sit to supine: Min assist   General bed mobility comments: Inc time needed for both transfers, MinA to assist with legs    Transfers Overall transfer level: Needs assistance Equipment used: Rolling walker (2 wheels) Transfers: Sit to/from Stand Sit to Stand: Mod assist           General transfer comment: ModA to ensure safety, constant cueing for hand placement    Ambulation/Gait   Gait Distance (Feet): 50 Feet Assistive device: Rolling walker (2 wheels) Gait Pattern/deviations: Shuffle, Drifts right/left, Trunk flexed, Narrow base of support, Decreased dorsiflexion - right, Decreased dorsiflexion - left, Decreased stride length Gait velocity: Decreased     General Gait Details: Cues to decrease distance from self to RW, shuffling gait, decreased foot clearance bilaterally  Stairs            Wheelchair Mobility     Tilt Bed    Modified Rankin (Stroke Patients Only)       Balance Overall balance assessment: Needs assistance Sitting-balance support: Feet supported, Bilateral upper extremity supported Sitting balance-Leahy Scale: Fair Sitting balance - Comments: Cues to maintain upright posture and avoid posterior bias Postural control: Posterior lean Standing balance support: Reliant on assistive device for balance, Bilateral upper extremity supported, During functional activity Standing balance-Leahy Scale: Poor Standing balance comment: RW  Pertinent Vitals/Pain Pain Assessment Pain Assessment: No/denies pain    Home Living Family/patient expects to be discharged to:: Private residence Living Arrangements: Spouse/significant other Available Help at Discharge: Other (Comment) Type of Home: House Home Access: Stairs to enter Entrance Stairs-Rails: Left;Right Entrance Stairs-Number of Steps: 4 Alternate Level Stairs-Number of Steps: 14 Home Layout: Two level Home Equipment: Agricultural consultant (2 wheels);Shower seat      Prior Function Prior Level of Function : Driving;Independent/Modified Independent;History of Falls (last six months)             Mobility Comments: Mod I walks with RW or SPC. Falls, per report about once a week ADLs Comments: Completes basic adls without assist but occasionally falls.Wife cannot assist due to back issues.     Extremity/Trunk Assessment   Upper Extremity Assessment Upper Extremity Assessment: Defer to OT evaluation    Lower Extremity Assessment Lower Extremity Assessment: Generalized weakness    Cervical / Trunk Assessment Cervical / Trunk Assessment: Normal  Communication   Communication Communication: Impaired Factors Affecting Communication: Hearing impaired    Cognition Arousal: Alert Behavior During Therapy: WFL for tasks assessed/performed   PT - Cognitive impairments: History of cognitive impairments, Orientation, Awareness, Problem solving, Safety/Judgement                       PT - Cognition Comments: A&O to self only Following commands: Impaired Following commands impaired: Follows one step commands inconsistently, Follows one step commands with increased time     Cueing Cueing Techniques: Verbal cues, Gestural cues, Tactile cues     General Comments General comments (skin integrity, edema, etc.): Patient's wife and family present and supportive at bedside    Exercises     Assessment/Plan    PT Assessment Patient needs continued  PT services  PT Problem List Decreased strength;Decreased range of motion;Decreased activity tolerance;Decreased balance;Decreased mobility;Decreased coordination;Decreased cognition;Decreased knowledge of use of DME;Decreased safety awareness;Decreased knowledge of precautions       PT Treatment Interventions DME instruction;Gait training;Functional mobility training;Therapeutic activities;Balance training;Neuromuscular re-education;Cognitive remediation;Patient/family education    PT Goals (Current goals can be found in the Care Plan section)  Acute Rehab PT Goals Patient Stated Goal: Return to PLOF PT Goal Formulation: With patient Time For Goal Achievement: 03/15/24 Potential to Achieve Goals: Good    Frequency Min 2X/week     Co-evaluation               AM-PAC PT "6 Clicks" Mobility  Outcome Measure Help needed turning from your back to your side while in a flat bed without using bedrails?: A Little Help needed moving from lying on your back to sitting on the side of a flat bed without using bedrails?: A Little Help needed moving to and from a bed to a chair (including a wheelchair)?: A Little Help needed standing up from a chair using your arms (e.g., wheelchair or bedside chair)?: A Lot Help needed to walk in hospital room?: A Little Help needed climbing 3-5 steps with a railing? : A Lot 6 Click Score: 16    End of Session Equipment Utilized During Treatment: Gait belt Activity Tolerance: Patient tolerated treatment well Patient left: in bed;with bed alarm set;with call bell/phone within reach;with family/visitor present   PT Visit Diagnosis: Unsteadiness on feet (R26.81);Repeated falls (R29.6);Muscle weakness (generalized) (M62.81);History of falling (Z91.81)    Time: 1325-1401 PT Time Calculation (min) (ACUTE ONLY): 36 min   Charges:   PT Evaluation $PT Eval Low Complexity: 1  Low PT Treatments $Gait Training: 8-22 mins PT General Charges $$ ACUTE PT  VISIT: 1 Visit         Doreen Beam, SPT   Oona Trammel 03/01/2024, 2:27 PM

## 2024-03-01 NOTE — Progress Notes (Signed)
 [facility]                                                                              James [name], is a 78 y.o. male, DOB - August 04, 1946, UJW:[id number] Admit date - 02/29/2024    Outpatient Primary MD for the patient is James Fillers, MD  LOS - 1  days  Chief Complaint  Patient presents with   Altered Mental Status   Weakness       Brief summary   Patient is a 78 year old male with HTN, hyperlipidemia, diabetes, GERD, history of splenic infarct, portal vein thrombosis, polycythemia vera, OSA, NASH cirrhosis, anemia, CML, anxiety presented with worsening weakness.  He was admitted 2/8-2/10 with altered mental status, dizziness, unsteady gait and had full neurology workup.  It was felt that his CML treatment Dasatinib was contributing to this and was discontinued.  Confusion has improved however he has had worsening generalized weakness, unsteady gait, needing help with transfers in the last 2 to 3 days.  Wife can no longer help care for him and patient is needing placement.  He was seen in the med [facility] ED on 3/13 and again presented to the hospital on 3/14. EDP also requested neurology to see the patient   Assessment & Plan    Principal Problem:   Gait abnormality Generalized debility -  Acute onset of gait abnormality with weakness and walking needing significant help with transfers.  Had some issues with his gait along with confusion a month ago which resolved.  Confusion has not returned but this weakness has been acute onset in the last 2 to 3 days. -CT head on 3/13 was negative for acute intracranial abnormality -MRI brain 3/14 showed no acute findings, age-related volume loss, chronic small vessel ischemic changes of the cerebral hemispheric white matter, old small vessel cerebellar infarctions.  Bilateral mastoid effusions. -EDP has consulted neurology, will await recommendations -Hold statin -Vitamin D low, placed on replacement.  B12  normal, folate normal, has iron deficiency -TSH normal 1.7, CRP 0.9, ESR 1. -PT OT evaluation recommended SNF  Active problems Acute kidney injury -Creatinine 1.6 on admission, was 1.5 on 3/14, 1.4 on 3/13 and 1.3 on 2/10 -Continue IV fluid hydration   Hypertension -BP stable   Hyperlipidemia - Hold home statin, check CK level   Diabetes - SSI   History of splenic infarct - Continue home Xarelto   Polycythemia vera, CML  Followed by oncology. Dr Myna Hidalgo outpatient.  Was on Sprycel with good response and at last eval in February was considered to be in remission.  Sprycel was discontinued.    NASH cirrhosis LFTs stable   OSA - Continue home CPAP   Estimated body mass index is 25.5 kg/m as calculated from the following:   Height as of this encounter: 5\' 6"  (1.676 m).   Weight as of this encounter: 71.7 kg.  Code Status: DNR DVT Prophylaxis:  rivaroxaban (XARELTO) tablet 10 mg Start: 03/01/24 1000 rivaroxaban (XARELTO) tablet 10 mg   Level of Care: Level of care: Telemetry Medical Family Communication: Updated patient Disposition Plan:      Remains inpatient  appropriate: PT OT evaluation recommended SNF   Procedures:  None  Consultants:   Neurology  Antimicrobials:   Anti-infectives (From admission, onward)    None          Medications  cholecalciferol  1,000 Units Oral Daily   insulin aspart  0-9 Units Subcutaneous TID WC   rivaroxaban  10 mg Oral Daily   rosuvastatin  20 mg Oral QHS   sodium chloride flush  3 mL Intravenous Q12H      Subjective:   James [name] was seen and examined today.  Still feels very weak, no acute nausea vomiting, chest pain, shortness of breath, fevers.   Objective:   Vitals:   02/29/24 2310 03/01/24 0354 03/01/24 0911 03/01/24 1145  BP:  (!) 127/94 (!) 126/99 (!) 144/63  Pulse:  76 84 84  Resp:  18 19 20   Temp: (!) 97.5 F (36.4 C) (!) 97.5 F (36.4 C) 97.9 F (36.6 C) 98.4 F (36.9 C)  TempSrc: Oral  Oral Oral Axillary  SpO2:  95% 91% 94%  Weight:      Height:        Intake/Output Summary (Last 24 hours) at 03/01/2024 1221 Last data filed at 03/01/2024 0934 Gross per 24 hour  Intake 480 ml  Output 525 ml  Net -45 ml     Wt Readings from Last 3 Encounters:  02/29/24 71.7 kg  02/28/24 71.7 kg  01/28/24 77.2 kg     Exam General: Alert and oriented, NAD, hearing deficit Cardiovascular: S1 S2 auscultated,  RRR Respiratory: Clear to auscultation bilaterally, no wheezing Gastrointestinal: Soft, nontender, nondistended, + bowel sounds Ext: no pedal edema bilaterally Neuro: Strength 5/5 upper and lower extremities bilaterally Psych: somewhat irritated    Data Reviewed:  I have personally reviewed following labs    CBC Lab Results  Component Value Date   WBC 5.6 03/01/2024   RBC 5.68 03/01/2024   HGB 17.2 (H) 03/01/2024   HCT 52.0 03/01/2024   MCV 91.5 03/01/2024   MCH 30.3 03/01/2024   PLT 259 03/01/2024   MCHC 33.1 03/01/2024   RDW 14.8 03/01/2024   LYMPHSABS 0.9 01/26/2024   MONOABS 0.9 01/26/2024   EOSABS 0.2 01/26/2024   BASOSABS 0.0 01/26/2024     Last metabolic panel Lab Results  Component Value Date   NA 138 03/01/2024   K 4.1 03/01/2024   CL 110 03/01/2024   CO2 22 03/01/2024   BUN 26 (H) 03/01/2024   CREATININE 1.67 (H) 03/01/2024   GLUCOSE 113 (H) 03/01/2024   GFRNONAA 42 (L) 03/01/2024   GFRAA 53 (L) 09/02/2020   CALCIUM 11.7 (H) 03/01/2024   PROT 6.4 (L) 03/01/2024   ALBUMIN 3.8 03/01/2024   LABGLOB 2.4 10/25/2016   AGRATIO 1.7 10/25/2016   BILITOT 0.8 03/01/2024   ALKPHOS 73 03/01/2024   AST 20 03/01/2024   ALT 11 03/01/2024   ANIONGAP 6 03/01/2024    CBG (last 3)  Recent Labs    03/01/24 0610 03/01/24 0906 03/01/24 1150  GLUCAP 128* 125* 165*      Coagulation Profile: No results for input(s): "INR", "PROTIME" in the last 168 hours.   Radiology Studies: I have personally reviewed the imaging studies  MR BRAIN WO  CONTRAST Result Date: 02/29/2024 CLINICAL DATA:  Transient ischemic attack.  Mental status changes. EXAM: MRI HEAD WITHOUT CONTRAST TECHNIQUE: Multiplanar, multiecho pulse sequences of the brain and surrounding structures were obtained without intravenous contrast. COMPARISON:  CT yesterday.  MRI 01/27/2024. FINDINGS: Brain:  Diffusion imaging does not show any acute or subacute infarction or other cause of restricted diffusion. No focal abnormality affects the brainstem. Few old small vessel cerebellar infarctions. Cerebral hemispheres show age related volume loss with moderate chronic small-vessel ischemic changes affecting the deep and subcortical white matter. No cortical or large vessel territory stroke. No mass, acute hemorrhage, hydrocephalus or extra-axial collection. Small focus of hemosiderin deposition in the left cerebellum. Vascular: Major vessels at the base of the brain show flow. Skull and upper cervical spine: Negative Sinuses/Orbits: Clear/normal Other: Bilateral mastoid effusions as present previously. IMPRESSION: 1. No acute finding by MRI. Age related volume loss. Moderate chronic small-vessel ischemic changes of the cerebral hemispheric white matter. Few old small vessel cerebellar infarctions. 2. Bilateral mastoid effusions as present previously. Electronically Signed   By: Paulina Fusi M.D.   On: 02/29/2024 13:06   CT Head Wo Contrast Result Date: 02/28/2024 CLINICAL DATA:  Weakness EXAM: CT HEAD WITHOUT CONTRAST TECHNIQUE: Contiguous axial images were obtained from the base of the skull through the vertex without intravenous contrast. RADIATION DOSE REDUCTION: This exam was performed according to the departmental dose-optimization program which includes automated exposure control, adjustment of the mA and/or kV according to patient size and/or use of iterative reconstruction technique. COMPARISON:  08/06/2024 FINDINGS: Brain: There is no mass, hemorrhage or extra-axial collection. There  is generalized atrophy without lobar predilection. Hypodensity of the white matter is most commonly associated with chronic microvascular disease. Vascular: Tortuous basilar artery, unchanged. Skull: The visualized skull base, calvarium and extracranial soft tissues are normal. Sinuses/Orbits: Small left mastoid effusion. Paranasal sinuses are clear. Normal orbits. Other: None. IMPRESSION: 1. No acute intracranial abnormality. 2. Generalized atrophy and findings of chronic microvascular disease. 3. Small left mastoid effusion. Electronically Signed   By: Deatra Robinson M.D.   On: 02/28/2024 19:12       Imara Standiford M.D. [facility] 03/01/2024, 12:21 PM  Available via Epic secure chat 7am-7pm After 7 pm, please refer to night coverage provider listed on amion.

## 2024-03-01 NOTE — Evaluation (Signed)
 Occupational Therapy Evaluation Patient Details Name: James [name]. MRN: [id number] DOB: 01/23/1946 Today's Date: 03/01/2024   History of Present Illness   78 y/o male presents to [facility] on 3/14 with progressive weakness and confusion. Patient was recently admitted 2/8 until 2/10 with altered mental status, dizziness, unsteady gait. PMH includes: hypertension, hyperlipidemia, diabetes, GERD, splenic infarct, portal vein thrombosis, polycythemia vera, OSA, NASH cirrhosis, anemia, CML, anxiety     Clinical Impressions Pt admitted for above, PTA pt reports living with spouse and being ind in ADLs and mod I using RW/SPC at home, although he does note a hx of falls. Pt spouse also not able to provide physical support at home due to back issues and is supposedly going to rehab. Pt currently demonstrating impaired strength and is very forgetful, discussed with pt son taking away keys to drive for safety. OT to continue following pt acutely to address listed deficits and help transition to next level of care. Patient would benefit from post acute skilled rehab facility with <3 hours of therapy and 24/7 support. If pt declines SNF, he would need 24/7 assist and someone physically able to help him at home.      If plan is discharge home, recommend the following:   A little help with walking and/or transfers;A little help with bathing/dressing/bathroom;Assistance with cooking/housework;Supervision due to cognitive status;Direct supervision/assist for medications management;Assist for transportation;Direct supervision/assist for financial management     Functional Status Assessment   Patient has had a recent decline in their functional status and demonstrates the ability to make significant improvements in function in a reasonable and predictable amount of time.     Equipment Recommendations   None recommended by OT (defer)     Recommendations for Other Services          Precautions/Restrictions   Precautions Precautions: Fall Recall of Precautions/Restrictions: Impaired Restrictions Weight Bearing Restrictions Per Provider Order: No     Mobility Bed Mobility Overal bed mobility: Needs Assistance Bed Mobility: Supine to Sit, Sit to Supine     Supine to sit: Contact guard, Used rails     General bed mobility comments: inc time needed to come into sitting    Transfers Overall transfer level: Needs assistance Equipment used: Rolling walker (2 wheels) Transfers: Sit to/from Stand Sit to Stand: Contact guard assist           General transfer comment: STSx2 from EOB, cues for hand placement but pt with disregard of cues      Balance Overall balance assessment: Needs assistance Sitting-balance support: No upper extremity supported, Feet supported Sitting balance-Leahy Scale: Fair     Standing balance support: Reliant on assistive device for balance, Bilateral upper extremity supported, During functional activity Standing balance-Leahy Scale: Poor Standing balance comment: RW                           ADL either performed or assessed with clinical judgement   ADL Overall ADL's : Needs assistance/impaired Eating/Feeding: Sitting;Set up   Grooming: Sitting;Set up   Upper Body Bathing: Sitting;Contact guard assist   Lower Body Bathing: Sitting/lateral leans;Minimal assistance   Upper Body Dressing : Sitting;Minimal assistance;Cueing for sequencing   Lower Body Dressing: Sitting/lateral leans;Moderate assistance   Toilet Transfer: Minimal assistance;Rolling walker (2 wheels);Ambulation   Toileting- Clothing Manipulation and Hygiene: Moderate assistance;Sit to/from stand       Functional mobility during ADLs: Minimal assistance;Rolling walker (2 wheels);Cueing for sequencing  Vision   Vision Assessment?: Wears glasses for reading;Vision impaired- to be further tested in functional context Additional  Comments: Pt very forgetful, misplaces objects that are essentially located in front of him. would be good to assess vision as well.     Perception         Praxis Praxis: Impaired Praxis Impairment Details: Organization     Pertinent Vitals/Pain Pain Assessment Pain Assessment: No/denies pain     Extremity/Trunk Assessment Upper Extremity Assessment Upper Extremity Assessment: Generalized weakness   Lower Extremity Assessment Lower Extremity Assessment: Defer to PT evaluation       Communication Communication Communication: Impaired Factors Affecting Communication: Hearing impaired   Cognition Arousal: Alert Behavior During Therapy: WFL for tasks assessed/performed Cognition: Cognition impaired   Orientation impairments: Time, Situation Awareness: Intellectual awareness intact, Online awareness impaired Memory impairment (select all impairments): Short-term memory, Working Biochemist, clinical functioning impairment (select all impairments): Organization, Reasoning                   Following commands: Impaired Following commands impaired: Follows one step commands inconsistently, Follows one step commands with increased time     Cueing  General Comments   Cueing Techniques: Verbal cues;Gestural cues;Tactile cues  VSS, pt son present at end of session   Exercises     Shoulder Instructions      Home Living Family/patient expects to be discharged to:: Private residence Living Arrangements: Spouse/significant other Available Help at Discharge: Other (Comment) (none, wife going to rehab next week for POB) Type of Home: House Home Access: Stairs to enter [employer] of Steps: 3 Entrance Stairs-Rails: Right;Left Home Layout: Two level Alternate Level Stairs-Number of Steps: 13 Alternate Level Stairs-Rails: Left Bathroom Shower/Tub: Walk-in shower;Door   Bathroom Toilet: Handicapped height     Home Equipment: Agricultural consultant (2  wheels);Shower seat          Prior Functioning/Environment Prior Level of Function : Driving;Independent/Modified Independent;History of Falls (last six months)             Mobility Comments: Mod I walks with RW or SPC. Falls, per report about once a week ADLs Comments: Completes basic adls without assist but occasionally falls.Wife cannot assist due to back issues.    OT Problem List: Decreased strength;Impaired balance (sitting and/or standing);Decreased safety awareness;Decreased cognition   OT Treatment/Interventions: Self-care/ADL training;Balance training;Therapeutic exercise;Therapeutic activities;DME and/or AE instruction;Patient/family education;Cognitive remediation/compensation      OT Goals(Current goals can be found in the care plan section)   Acute Rehab OT Goals Patient Stated Goal: To get better OT Goal Formulation: With patient Time For Goal Achievement: 03/15/24 Potential to Achieve Goals: Good ADL Goals Pt Will Perform Grooming: with supervision;standing Pt Will Perform Lower Body Dressing: with supervision;sit to/from stand Pt Will Transfer to Toilet: with supervision;ambulating Additional ADL Goal #4: Pt will verbalize understanding of at least 3 fall prevention strategies to use in home.   OT Frequency:  Min 2X/week    Co-evaluation              AM-PAC OT "6 Clicks" Daily Activity     Outcome Measure Help from another person eating meals?: A Little Help from another person taking care of personal grooming?: A Little Help from another person toileting, which includes using toliet, bedpan, or urinal?: A Lot Help from another person bathing (including washing, rinsing, drying)?: A Little Help from another person to put on and taking off regular upper body clothing?: A Little Help from another  person to put on and taking off regular lower body clothing?: A Lot 6 Click Score: 16   End of Session Equipment Utilized During Treatment: Gait  belt;Rolling walker (2 wheels) Nurse Communication: Mobility status  Activity Tolerance: Patient tolerated treatment well Patient left: in chair;with call bell/phone within reach;with chair alarm set;with family/visitor present  OT Visit Diagnosis: Unsteadiness on feet (R26.81);Other abnormalities of gait and mobility (R26.89);History of falling (Z91.81);Muscle weakness (generalized) (M62.81);Other symptoms and signs involving cognitive function                Time: 1040-1118 OT Time Calculation (min): 38 min Charges:  OT General Charges $OT Visit: 1 Visit OT Evaluation $OT Eval Moderate Complexity: 1 Mod OT Treatments $Therapeutic Activity: 23-37 mins  03/01/2024  AB, OTR/L  Acute Rehabilitation Services  Office: [phone]   Tristan Schroeder 03/01/2024, 12:23 PM

## 2024-03-01 NOTE — Plan of Care (Signed)
  Problem: Education: Goal: Knowledge of General Education information will improve Description: Including pain rating scale, medication(s)/side effects and non-pharmacologic comfort measures Outcome: Not Progressing   Problem: Health Behavior/Discharge Planning: Goal: Ability to manage health-related needs will improve Outcome: Not Progressing   Problem: Clinical Measurements: Goal: Will remain free from infection Outcome: Progressing Goal: Respiratory complications will improve Outcome: Progressing Goal: Cardiovascular complication will be avoided Outcome: Progressing  PATIENT IS CONFUSED

## 2024-03-01 NOTE — Plan of Care (Signed)
  Problem: Clinical Measurements: Goal: Will remain free from infection Outcome: Progressing Goal: Respiratory complications will improve Outcome: Progressing Goal: Cardiovascular complication will be avoided Outcome: Progressing   

## 2024-03-02 DIAGNOSIS — F419 Anxiety disorder, unspecified: Secondary | ICD-10-CM | POA: Diagnosis not present

## 2024-03-02 DIAGNOSIS — E0800 Diabetes mellitus due to underlying condition with hyperosmolarity without nonketotic hyperglycemic-hyperosmolar coma (NKHHC): Secondary | ICD-10-CM | POA: Diagnosis not present

## 2024-03-02 DIAGNOSIS — D500 Iron deficiency anemia secondary to blood loss (chronic): Secondary | ICD-10-CM

## 2024-03-02 DIAGNOSIS — R269 Unspecified abnormalities of gait and mobility: Secondary | ICD-10-CM | POA: Diagnosis not present

## 2024-03-02 DIAGNOSIS — C9210 Chronic myeloid leukemia, BCR/ABL-positive, not having achieved remission: Secondary | ICD-10-CM | POA: Diagnosis not present

## 2024-03-02 LAB — AMMONIA: Ammonia: 23 umol/L (ref 9–35)

## 2024-03-02 LAB — RENAL FUNCTION PANEL
Albumin: 3.2 g/dL — ABNORMAL LOW (ref 3.5–5.0)
Anion gap: 6 (ref 5–15)
BUN: 23 mg/dL (ref 8–23)
CO2: 23 mmol/L (ref 22–32)
Calcium: 11 mg/dL — ABNORMAL HIGH (ref 8.9–10.3)
Chloride: 110 mmol/L (ref 98–111)
Creatinine, Ser: 1.46 mg/dL — ABNORMAL HIGH (ref 0.61–1.24)
GFR, Estimated: 49 mL/min — ABNORMAL LOW (ref >60)
Glucose, Bld: 133 mg/dL — ABNORMAL HIGH (ref 70–99)
Phosphorus: 2.8 mg/dL (ref 2.5–4.6)
Potassium: 4 mmol/L (ref 3.5–5.1)
Sodium: 139 mmol/L (ref 135–145)

## 2024-03-02 LAB — CBC
HCT: 49 % (ref 39.0–52.0)
Hemoglobin: 15.8 g/dL (ref 13.0–17.0)
MCH: 29.3 pg (ref 26.0–34.0)
MCHC: 32.2 g/dL (ref 30.0–36.0)
MCV: 90.9 fL (ref 80.0–100.0)
Platelets: 225 10*3/uL (ref 150–400)
RBC: 5.39 MIL/uL (ref 4.22–5.81)
RDW: 14.6 % (ref 11.5–15.5)
WBC: 6.1 10*3/uL (ref 4.0–10.5)
nRBC: 0 % (ref 0.0–0.2)

## 2024-03-02 LAB — CK: Total CK: 51 U/L (ref 49–397)

## 2024-03-02 LAB — GLUCOSE, CAPILLARY
Glucose-Capillary: 138 mg/dL — ABNORMAL HIGH (ref 70–99)
Glucose-Capillary: 157 mg/dL — ABNORMAL HIGH (ref 70–99)
Glucose-Capillary: 132 mg/dL — ABNORMAL HIGH (ref 70–99)

## 2024-03-02 MED ORDER — QUETIAPINE FUMARATE 25 MG PO TABS
12.5000 mg | ORAL_TABLET | Freq: Every day | ORAL | Status: DC
  Administered 2024-03-02 – 2024-03-03 (×2): 12.5 mg via ORAL
  Filled 2024-03-02 (×2): qty 1

## 2024-03-02 MED ORDER — HALOPERIDOL LACTATE 5 MG/ML IJ SOLN
0.5000 mg | Freq: Four times a day (QID) | INTRAMUSCULAR | Status: DC | PRN
  Administered 2024-03-03 – 2024-03-08 (×3): 0.5 mg via INTRAVENOUS
  Filled 2024-03-02 (×4): qty 1

## 2024-03-02 MED ORDER — BUPROPION HCL ER (XL) 150 MG PO TB24
300.0000 mg | ORAL_TABLET | Freq: Every day | ORAL | Status: DC
  Administered 2024-03-03 – 2024-03-08 (×5): 300 mg via ORAL
  Filled 2024-03-02 (×5): qty 2

## 2024-03-02 MED ORDER — ROSUVASTATIN CALCIUM 20 MG PO TABS
20.0000 mg | ORAL_TABLET | Freq: Every day | ORAL | Status: DC
  Administered 2024-03-02 – 2024-03-07 (×4): 20 mg via ORAL
  Filled 2024-03-02 (×5): qty 1

## 2024-03-02 MED ORDER — DESMOPRESSIN ACETATE 0.1 MG PO TABS
400.0000 ug | ORAL_TABLET | Freq: Every day | ORAL | Status: DC
  Administered 2024-03-02 – 2024-03-07 (×4): 400 ug via ORAL
  Filled 2024-03-02 (×7): qty 4

## 2024-03-02 NOTE — Plan of Care (Signed)
  Problem: Clinical Measurements: Goal: Ability to maintain clinical measurements within normal limits will improve Outcome: Progressing   Problem: Activity: Goal: Risk for activity intolerance will decrease Outcome: Progressing   Problem: Nutrition: Goal: Adequate nutrition will be maintained Outcome: Progressing   Problem: Elimination: Goal: Will not experience complications related to bowel motility Outcome: Progressing Goal: Will not experience complications related to urinary retention Outcome: Progressing   Problem: Pain Managment: Goal: General experience of comfort will improve and/or be controlled Outcome: Progressing   Problem: Safety: Goal: Ability to remain free from injury will improve Outcome: Progressing

## 2024-03-02 NOTE — Progress Notes (Signed)
   03/02/24 2000  BiPAP/CPAP/SIPAP  BiPAP/CPAP/SIPAP Pt Type Adult  BiPAP/CPAP/SIPAP DREAMSTATIOND  Reason BIPAP/CPAP not in use Non-compliant  BiPAP/CPAP /SiPAP Vitals  Pulse Rate 80  Resp 16  SpO2 98 %

## 2024-03-02 NOTE — NC FL2 (Signed)
 [place] MEDICAID FL2 LEVEL OF CARE FORM     IDENTIFICATION  Patient Name: James [name]. Birthdate: 1946/03/31 Sex: male Admission Date (Current Location): 02/29/2024  [facility] and [place] Number:  Producer, television/film/video and Address:  [facility], [address] [place], [place] [address]      Provider Number: [id number]  Attending Physician Name and Address:  Cathren Harsh, MD  Relative Name and Phone Number:  Randal, Goens (Spouse)  [phone]    Current Level of Care: Hospital Recommended Level of Care: Skilled Nursing Facility Prior Approval Number:    Date Approved/Denied:   PASRR Number: [id number]  Discharge Plan: SNF    Current Diagnoses: Patient Active Problem List   Diagnosis Date Noted   Gait abnormality 02/29/2024   Anxiety    Depression    GERD (gastroesophageal reflux disease)    DDD (degenerative disc disease), cervical 01/30/2023   Pre-syncope 01/30/2023   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 01/30/2023   CPAP use counseling 01/30/2023   Acute metabolic encephalopathy 09/03/2021   Acute encephalopathy 09/02/2021   Type 2 diabetes mellitus with hyperglycemia (HCC) 09/02/2021   SIRS (systemic inflammatory response syndrome) (HCC) 09/02/2021   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   OSA on CPAP 10/30/2017   Hypogonadism, male 10/30/2017   Iron deficiency anemia due to chronic blood loss 11/20/2016   Erosive gastritis with hemorrhage    Melena    Acute blood loss anemia 09/27/2016   Acute upper GI bleed 09/27/2016   Abnormal liver diagnostic imaging 06/22/2016   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/03/2016   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   Portal vein thrombosis 04/29/2016   DM (diabetes mellitus) (HCC) 04/29/2016   Leukocytosis 04/29/2016   Dyspnea 12/31/2015   HTN (hypertension) 12/31/2015   Hyperlipidemia 12/31/2015   Nocturia more than  twice per night 11/11/2014   Severe obesity (BMI >= 40) (HCC) 11/11/2014   Hypersomnia with sleep apnea 11/11/2014   Polycythemia vera (HCC) 11/29/2011    Orientation RESPIRATION BLADDER Height & Weight     Self, Place, Time  Normal Incontinent, External catheter Weight: 158 lb (71.7 kg) Height:  5\' 6"  (167.6 cm)  BEHAVIORAL SYMPTOMS/MOOD NEUROLOGICAL BOWEL NUTRITION STATUS      Continent Diet (see DC summary)  AMBULATORY STATUS COMMUNICATION OF NEEDS Skin   Limited Assist Verbally Normal                       Personal Care Assistance Level of Assistance  Bathing, Dressing, Feeding Bathing Assistance: Limited assistance Feeding assistance: Limited assistance Dressing Assistance: Limited assistance     Functional Limitations Info  Sight, Hearing, Speech Sight Info: Adequate Hearing Info: Impaired Speech Info: Adequate    SPECIAL CARE FACTORS FREQUENCY  PT (By licensed PT), OT (By licensed OT)     PT Frequency: 5x/week OT Frequency: 5x/week            Contractures Contractures Info: Not present    Additional Factors Info  Code Status, Allergies Code Status Info: DNR Allergies Info: Ibuprofen  Nsaids           Current Medications (03/02/2024):  This is the current hospital active medication list Current Facility-Administered Medications  Medication Dose Route Frequency Provider Last Rate Last Admin   acetaminophen (TYLENOL) tablet 650 mg  650 mg Oral Q6H PRN Synetta Fail, MD       Or  acetaminophen (TYLENOL) suppository 650 mg  650 mg Rectal Q6H PRN Synetta Fail, MD       cholecalciferol (VITAMIN D3) 25 MCG (1000 UNIT) tablet 1,000 Units  1,000 Units Oral Daily Rai, Ripudeep K, MD   1,000 Units at 03/02/24 0830   insulin aspart (novoLOG) injection 0-9 Units  0-9 Units Subcutaneous TID WC Synetta Fail, MD   1 Units at 03/02/24 0719   polyethylene glycol (MIRALAX / GLYCOLAX) packet 17 g  17 g Oral Daily PRN Synetta Fail, MD        rivaroxaban Carlena Hurl) tablet 10 mg  10 mg Oral Daily Synetta Fail, MD   10 mg at 03/02/24 0830   sodium chloride flush (NS) 0.9 % injection 3 mL  3 mL Intravenous Q12H Synetta Fail, MD   3 mL at 03/02/24 0831   traZODone (DESYREL) tablet 50 mg  50 mg Oral QHS PRN Synetta Fail, MD   50 mg at 03/01/24 2000     Discharge Medications: Please see discharge summary for a list of discharge medications.  Relevant Imaging Results:  Relevant Lab Results:   Additional Information [id number]  Shahzad Thomann A [place], LCSW

## 2024-03-02 NOTE — TOC Initial Note (Addendum)
 Transition of Care (TOC) - Initial/Assessment Note    Patient Details  Name: James [name]. MRN: [id number] Date of Birth: 1946/07/03  Transition of Care [facility]) CM/SW Contact:    Adien Kimmel A [place], LCSW Phone Number: 03/02/2024, 11:16 AM  Clinical Narrative:                  CSW spoke with pt's wife Joy, to complete assessment. She was able to confirm that pt was agreeable to short term rehab at [facility]. Discussed SNF process, no further questions asked. Preference for [facility], [facility] and [facility], stated granddaughter works [employer]. SNF workup completed. Bed offers pending.  Medicare.gov ratings to be given when bed offers available.   Expected Discharge Plan: Skilled Nursing Facility Barriers to Discharge: Continued Medical Work up, SNF Pending bed offer   Patient Goals and CMS Choice            Expected Discharge Plan and Services       Living arrangements for the past 2 months: Single Family Home                                      Prior Living Arrangements/Services Living arrangements for the past 2 months: Single Family Home Lives with:: Spouse            Care giver support system in place?: Yes (comment) (pt's wife, Ander Slade)      Activities of Daily Living   ADL Screening (condition at time of admission) Independently performs ADLs?: No Does the patient have a NEW difficulty with bathing/dressing/toileting/self-feeding that is expected to last >3 days?: Yes (Initiates electronic notice to provider for possible OT consult) Does the patient have a NEW difficulty with getting in/out of bed, walking, or climbing stairs that is expected to last >3 days?: Yes (Initiates electronic notice to provider for possible PT consult) Does the patient have a NEW difficulty with communication that is expected to last >3 days?: Yes (Initiates electronic notice to provider for possible SLP consult) Is the patient deaf or have difficulty hearing?: No Does the  patient have difficulty seeing, even when wearing glasses/contacts?: No Does the patient have difficulty concentrating, remembering, or making decisions?: Yes  Permission Sought/Granted                  Emotional Assessment Appearance:: Appears stated age Attitude/Demeanor/Rapport: Unable to Assess Affect (typically observed): Unable to Assess Orientation: : Oriented to Self, Oriented to Place, Oriented to  Time Alcohol / Substance Use: Not Applicable Psych Involvement: No (comment)  Admission diagnosis:  Gait abnormality [R26.9] Patient Active Problem List   Diagnosis Date Noted   Gait abnormality 02/29/2024   Anxiety    Depression    GERD (gastroesophageal reflux disease)    DDD (degenerative disc disease), cervical 01/30/2023   Pre-syncope 01/30/2023   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 01/30/2023   CPAP use counseling 01/30/2023   Acute metabolic encephalopathy 09/03/2021   Acute encephalopathy 09/02/2021   Type 2 diabetes mellitus with hyperglycemia (HCC) 09/02/2021   SIRS (systemic inflammatory response syndrome) (HCC) 09/02/2021   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   OSA on CPAP 10/30/2017   Hypogonadism, male 10/30/2017   Iron deficiency anemia due to chronic blood loss 11/20/2016   Erosive gastritis with hemorrhage    Melena    Acute blood loss anemia  09/27/2016   Acute upper GI bleed 09/27/2016   Abnormal liver diagnostic imaging 06/22/2016   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/03/2016   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   Portal vein thrombosis 04/29/2016   DM (diabetes mellitus) (HCC) 04/29/2016   Leukocytosis 04/29/2016   Dyspnea 12/31/2015   HTN (hypertension) 12/31/2015   Hyperlipidemia 12/31/2015   Nocturia more than twice per night 11/11/2014   Severe obesity (BMI >= 40) (HCC) 11/11/2014   Hypersomnia with sleep apnea 11/11/2014   Polycythemia vera (HCC) 11/29/2011    PCP:  [name], MD Pharmacy:   [facility] [phone] - [place], [place] - [address] [place] [address] Phone: [phone] Fax: [phone]  [facility] - [place], [place] - [address] [place] [address] Phone: [phone] Fax: [phone]  [facility] [address] [place] [address] Phone: [phone] Fax: [phone]     Social Drivers of Health (SDOH) Social History: SDOH Screenings   Food Insecurity: No Food Insecurity (02/29/2024)  Housing: Low Risk  (02/29/2024)  Transportation Needs: No Transportation Needs (02/29/2024)  Utilities: Not At Risk (02/29/2024)  Depression (PHQ2-9): Medium Risk (05/25/2020)  Social Connections: Socially Isolated (02/29/2024)  Tobacco Use: Medium Risk (02/29/2024)   SDOH Interventions:     Readmission Risk Interventions     No data to display

## 2024-03-02 NOTE — Progress Notes (Signed)
 [facility]                                                                              James [name], is a 78 y.o. male, DOB - [birth year], YQM:[id number] Admit date - 02/29/2024    Outpatient Primary MD for the patient is James Fillers, MD  LOS - 2  days  Chief Complaint  Patient presents with   Altered Mental Status   Weakness       Brief summary   Patient is a 78 year old male with HTN, hyperlipidemia, diabetes, GERD, history of splenic infarct, portal vein thrombosis, polycythemia vera, OSA, NASH cirrhosis, anemia, CML, anxiety presented with worsening weakness.  He was admitted 2/8-2/10 with altered mental status, dizziness, unsteady gait and had full neurology workup.  It was felt that his CML treatment Dasatinib was contributing to this and was discontinued.  Confusion has improved however he has had worsening generalized weakness, unsteady gait, needing help with transfers in the last 2 to 3 days.  Wife can no longer help care for him and patient is needing placement.  He was seen in the med [facility] ED on 3/13 and again presented to the hospital on 3/14. EDP also requested neurology to see the patient   Assessment & Plan    Principal Problem:   Gait abnormality Generalized debility -  Acute onset of gait abnormality with weakness and walking needing significant help with transfers.  Had some issues with his gait along with confusion a month ago which resolved.  Confusion has not returned but this weakness has been acute onset in the last 2 to 3 days. -CT head on 3/13 was negative for acute intracranial abnormality -MRI brain 3/14 showed no acute findings, age-related volume loss, chronic small vessel ischemic changes of the cerebral hemispheric white matter, old small vessel cerebellar infarctions.  Bilateral mastoid effusions. -CK normal, can resume statin -Vitamin D low, placed on replacement.  B12 normal, folate normal, has iron  deficiency -TSH normal 1.7, CRP 0.9, ESR 1. -PT OT evaluation recommended SNF -Likely also has baseline gait instability due to old cerebellar infarctions  Active problems Acute metabolic encephalopathy, with underlying cognitive dysfunction/vascular dementia -Mental status much better today, much more alert and oriented today -Ammonia level normal, no acute infectious process, likely has underlying dementia  Acute kidney injury -Creatinine 1.6 on admission, was 1.5 on 3/14, 1.4 on 3/13 and 1.3 on 2/10 -Continue IV fluid hydration -Creatinine close to baseline   Hypertension -BP stable   Hyperlipidemia -CK normal, resume statin   Diabetes -Continue sliding scale insulin while inpatient CBG (last 3)  Recent Labs    03/01/24 1649 03/01/24 2158 03/02/24 0603  GLUCAP 120* 219* 138*    Chronic nocturia -Continue DDAVP at night   History of splenic infarct - Continue home Xarelto   Polycythemia vera, CML  Followed by oncology. Dr Myna Hidalgo outpatient.  Was on Sprycel with good response and at last eval in February was considered to be in remission.  Sprycel was discontinued.    NASH cirrhosis LFTs stable   OSA - Continue home CPAP   Estimated body mass  index is 25.5 kg/m as calculated from the following:   Height as of this encounter: 5\' 6"  (1.676 m).   Weight as of this encounter: 71.7 kg.  Code Status: DNR DVT Prophylaxis:  rivaroxaban (XARELTO) tablet 10 mg Start: 03/01/24 1000 rivaroxaban (XARELTO) tablet 10 mg   Level of Care: Level of care: Telemetry Medical Family Communication: Updated patient's son at the bedside Disposition Plan:      Remains inpatient appropriate: PT OT evaluation recommended SNF.  Per son, [facility] their first choice   Procedures:  None  Consultants:   Neurology  Antimicrobials:   Anti-infectives (From admission, onward)    None          Medications  cholecalciferol  1,000 Units Oral Daily   insulin aspart   0-9 Units Subcutaneous TID WC   rivaroxaban  10 mg Oral Daily   sodium chloride flush  3 mL Intravenous Q12H      Subjective:   James [name] was seen and examined today.  Much more alert and oriented today, feels closer to baseline.  Son at the bedside.  Slept okay last night, no nausea vomiting, chest pain or shortness of breath.  Tolerating diet.  Objective:   Vitals:   03/01/24 1940 03/02/24 0029 03/02/24 0347 03/02/24 0839  BP: 117/82 (!) 156/85 (!) 175/88 139/83  Pulse: 93 71 74 80  Resp: 17 17 17 17   Temp: 98 F (36.7 C) 97.8 F (36.6 C) (!) 97.5 F (36.4 C) 98.5 F (36.9 C)  TempSrc:  Oral  Axillary  SpO2: 92% 94% 96% 96%  Weight:      Height:        Intake/Output Summary (Last 24 hours) at 03/02/2024 1241 Last data filed at 03/02/2024 0831 Gross per 24 hour  Intake 2555.37 ml  Output 400 ml  Net 2155.37 ml     Wt Readings from Last 3 Encounters:  02/29/24 71.7 kg  02/28/24 71.7 kg  01/28/24 77.2 kg   Physical Exam General: Alert and oriented x 3, NAD Cardiovascular: S1 S2 clear, RRR.  Respiratory: CTAB, no wheezing Gastrointestinal: Soft, nontender, nondistended, NBS Ext: no pedal edema bilaterally Neuro: no new deficits Psych: Normal affect, pleasant and cooperative    Data Reviewed:  I have personally reviewed following labs    CBC Lab Results  Component Value Date   WBC 6.1 03/02/2024   RBC 5.39 03/02/2024   HGB 15.8 03/02/2024   HCT 49.0 03/02/2024   MCV 90.9 03/02/2024   MCH 29.3 03/02/2024   PLT 225 03/02/2024   MCHC 32.2 03/02/2024   RDW 14.6 03/02/2024   LYMPHSABS 0.9 01/26/2024   MONOABS 0.9 01/26/2024   EOSABS 0.2 01/26/2024   BASOSABS 0.0 01/26/2024     Last metabolic panel Lab Results  Component Value Date   NA 139 03/02/2024   K 4.0 03/02/2024   CL 110 03/02/2024   CO2 23 03/02/2024   BUN 23 03/02/2024   CREATININE 1.46 (H) 03/02/2024   GLUCOSE 133 (H) 03/02/2024   GFRNONAA 49 (L) 03/02/2024   GFRAA 53 (L)  09/02/2020   CALCIUM 11.0 (H) 03/02/2024   PHOS 2.8 03/02/2024   PROT 6.4 (L) 03/01/2024   ALBUMIN 3.2 (L) 03/02/2024   LABGLOB 2.4 10/25/2016   AGRATIO 1.7 10/25/2016   BILITOT 0.8 03/01/2024   ALKPHOS 73 03/01/2024   AST 20 03/01/2024   ALT 11 03/01/2024   ANIONGAP 6 03/02/2024    CBG (last 3)  Recent Labs  03/01/24 1649 03/01/24 2158 03/02/24 0603  GLUCAP 120* 219* 138*      Coagulation Profile: No results for input(s): "INR", "PROTIME" in the last 168 hours.   Radiology Studies: I have personally reviewed the imaging studies  MR BRAIN WO CONTRAST Result Date: 02/29/2024 CLINICAL DATA:  Transient ischemic attack.  Mental status changes. EXAM: MRI HEAD WITHOUT CONTRAST TECHNIQUE: Multiplanar, multiecho pulse sequences of the brain and surrounding structures were obtained without intravenous contrast. COMPARISON:  CT yesterday.  MRI 01/27/2024. FINDINGS: Brain: Diffusion imaging does not show any acute or subacute infarction or other cause of restricted diffusion. No focal abnormality affects the brainstem. Few old small vessel cerebellar infarctions. Cerebral hemispheres show age related volume loss with moderate chronic small-vessel ischemic changes affecting the deep and subcortical white matter. No cortical or large vessel territory stroke. No mass, acute hemorrhage, hydrocephalus or extra-axial collection. Small focus of hemosiderin deposition in the left cerebellum. Vascular: Major vessels at the base of the brain show flow. Skull and upper cervical spine: Negative Sinuses/Orbits: Clear/normal Other: Bilateral mastoid effusions as present previously. IMPRESSION: 1. No acute finding by MRI. Age related volume loss. Moderate chronic small-vessel ischemic changes of the cerebral hemispheric white matter. Few old small vessel cerebellar infarctions. 2. Bilateral mastoid effusions as present previously. Electronically Signed   By: Paulina Fusi M.D.   On: 02/29/2024 13:06        Itzell Bendavid M.D. [facility] 03/02/2024, 12:41 PM  Available via Epic secure chat 7am-7pm After 7 pm, please refer to night coverage provider listed on amion.

## 2024-03-03 ENCOUNTER — Inpatient Hospital Stay (HOSPITAL_COMMUNITY)

## 2024-03-03 DIAGNOSIS — R4182 Altered mental status, unspecified: Secondary | ICD-10-CM | POA: Diagnosis not present

## 2024-03-03 DIAGNOSIS — C9210 Chronic myeloid leukemia, BCR/ABL-positive, not having achieved remission: Secondary | ICD-10-CM | POA: Diagnosis not present

## 2024-03-03 DIAGNOSIS — R569 Unspecified convulsions: Secondary | ICD-10-CM | POA: Diagnosis not present

## 2024-03-03 DIAGNOSIS — F419 Anxiety disorder, unspecified: Secondary | ICD-10-CM | POA: Diagnosis not present

## 2024-03-03 DIAGNOSIS — E0800 Diabetes mellitus due to underlying condition with hyperosmolarity without nonketotic hyperglycemic-hyperosmolar coma (NKHHC): Secondary | ICD-10-CM | POA: Diagnosis not present

## 2024-03-03 DIAGNOSIS — R269 Unspecified abnormalities of gait and mobility: Secondary | ICD-10-CM | POA: Diagnosis not present

## 2024-03-03 LAB — GLUCOSE, CAPILLARY
Glucose-Capillary: 125 mg/dL — ABNORMAL HIGH (ref 70–99)
Glucose-Capillary: 133 mg/dL — ABNORMAL HIGH (ref 70–99)
Glucose-Capillary: 116 mg/dL — ABNORMAL HIGH (ref 70–99)
Glucose-Capillary: 192 mg/dL — ABNORMAL HIGH (ref 70–99)

## 2024-03-03 LAB — RENAL FUNCTION PANEL
Albumin: 3.2 g/dL — ABNORMAL LOW (ref 3.5–5.0)
Anion gap: 6 (ref 5–15)
BUN: 20 mg/dL (ref 8–23)
CO2: 22 mmol/L (ref 22–32)
Calcium: 10.9 mg/dL — ABNORMAL HIGH (ref 8.9–10.3)
Chloride: 107 mmol/L (ref 98–111)
Creatinine, Ser: 1.28 mg/dL — ABNORMAL HIGH (ref 0.61–1.24)
GFR, Estimated: 57 mL/min — ABNORMAL LOW (ref >60)
Glucose, Bld: 133 mg/dL — ABNORMAL HIGH (ref 70–99)
Phosphorus: 2.8 mg/dL (ref 2.5–4.6)
Potassium: 3.8 mmol/L (ref 3.5–5.1)
Sodium: 135 mmol/L (ref 135–145)

## 2024-03-03 LAB — CBC
HCT: 47.4 % (ref 39.0–52.0)
Hemoglobin: 15.4 g/dL (ref 13.0–17.0)
MCH: 29.8 pg (ref 26.0–34.0)
MCHC: 32.5 g/dL (ref 30.0–36.0)
MCV: 91.7 fL (ref 80.0–100.0)
Platelets: 226 10*3/uL (ref 150–400)
RBC: 5.17 MIL/uL (ref 4.22–5.81)
RDW: 14.5 % (ref 11.5–15.5)
WBC: 8 10*3/uL (ref 4.0–10.5)
nRBC: 0 % (ref 0.0–0.2)

## 2024-03-03 MED ORDER — HYDRALAZINE HCL 20 MG/ML IJ SOLN
10.0000 mg | Freq: Four times a day (QID) | INTRAMUSCULAR | Status: DC | PRN
  Administered 2024-03-05 – 2024-03-07 (×4): 10 mg via INTRAVENOUS
  Filled 2024-03-03 (×4): qty 1

## 2024-03-03 MED ORDER — AMLODIPINE BESYLATE 5 MG PO TABS: 5.0000 mg | ORAL_TABLET | Freq: Every day | ORAL | Status: DC

## 2024-03-03 MED ORDER — PROPRANOLOL HCL 10 MG PO TABS
10.0000 mg | ORAL_TABLET | Freq: Every day | ORAL | Status: DC
  Administered 2024-03-03 – 2024-03-08 (×5): 10 mg via ORAL
  Filled 2024-03-03 (×5): qty 1

## 2024-03-03 NOTE — Progress Notes (Signed)
 Physical Therapy Treatment Patient Details Name: James [name]. MRN: [id number] DOB: Jul 12, 1946 Today's Date: 03/03/2024   History of Present Illness 78 y/o male presents to [facility] on 3/14 with progressive weakness and confusion. Patient was recently admitted 2/8 until 2/10 with altered mental status, dizziness, unsteady gait. PMH includes: hypertension, hyperlipidemia, diabetes, GERD, splenic infarct, portal vein thrombosis, polycythemia vera, OSA, NASH cirrhosis, anemia, CML, anxiety    PT Comments  Pt seen for PT tx with pt at foot of bed attempting to get out, wife present, unsure how to assist pt, reporting pt is frustrated with her. PT redirects pt throughout session, pt is limited by significantly impaired cognition & HOH. Pt requires min assist with use of hospital bed features for supine>sit, mod assist for STS with poor awareness of hand placement & poor ability to follow multimodal cuing. Pt ambulates with RW & min<>mod assist with chair follow for safety. Pt with very poor safety awareness with mobility, impaired awareness of how to use AD despite cuing. After gait pt appears fatigued but when asked he replies he's not tired just exhausted. At this time, pt is at extremely high risk for falls. Continue to recommend ongoing PT services to address deficits to decrease caregiver burden & reduce fall risk.    If plan is discharge home, recommend the following: Assistance with cooking/housework;Direct supervision/assist for medications management;Direct supervision/assist for financial management;Assist for transportation;Help with stairs or ramp for entrance;Supervision due to cognitive status;A lot of help with bathing/dressing/bathroom;A lot of help with walking and/or transfers   Can travel by private vehicle     No  Equipment Recommendations  None recommended by PT    Recommendations for Other Services       Precautions / Restrictions Precautions Precautions: Fall Recall of  Precautions/Restrictions: Impaired Restrictions Weight Bearing Restrictions Per Provider Order: No     Mobility  Bed Mobility Overal bed mobility: Needs Assistance Bed Mobility: Supine to Sit     Supine to sit: Min assist, Used rails, HOB elevated (extra time, cuing to upright trunk to come to sitting upright on EOB) Sit to supine: HOB elevated, Used rails, Contact guard assist        Transfers Overall transfer level: Needs assistance Equipment used: Rolling walker (2 wheels) Transfers: Sit to/from Stand Sit to Stand: Mod assist           General transfer comment: very poor awareness of hand placement, decreased ability to follow multimodal cuing to push to standing.    Ambulation/Gait Ambulation/Gait assistance: Min assist, Mod assist Gait Distance (Feet): 30 Feet (+ 20 ft) Assistive device: Rolling walker (2 wheels) Gait Pattern/deviations: Shuffle, Decreased dorsiflexion - right, Decreased dorsiflexion - left, Decreased stride length, Trunk flexed, Decreased step length - right, Decreased step length - left Gait velocity: Decreased     General Gait Details: Pt pushing RW significantly far out in front of him, unable to follow multimodal cuing to step inside AD. At times, ambulating adjacent to AD, very poor awareness overall. PT provides chair follow for safety.   Stairs             Wheelchair Mobility     Tilt Bed    Modified Rankin (Stroke Patients Only)       Balance Overall balance assessment: Needs assistance Sitting-balance support: Feet supported, Bilateral upper extremity supported Sitting balance-Leahy Scale: Poor Sitting balance - Comments: ongoing posterior lean with min assist & pt participating in shifting weight anteriorly   Standing balance support: Reliant on  assistive device for balance, Bilateral upper extremity supported, During functional activity Standing balance-Leahy Scale: Poor                               Communication Communication Communication: Impaired Factors Affecting Communication: Hearing impaired  Cognition Arousal: Alert Behavior During Therapy: Restless   PT - Cognitive impairments: History of cognitive impairments, Awareness, Orientation, Memory, Attention, Initiation, Sequencing, Problem solving, Safety/Judgement                       PT - Cognition Comments: very poor cognition, poor ability to follow simple commands throughout session Following commands: Impaired Following commands impaired: Follows one step commands inconsistently, Follows one step commands with increased time    Cueing Cueing Techniques: Verbal cues, Gestural cues, Tactile cues  Exercises      General Comments General comments (skin integrity, edema, etc.): Pt's wife present at bedside reporting pt is frustrated with her, she's unsure of how to interact with him, asking questions about vascular dementia with PT educating her to the best of PT's ability, encouraging her to speak with neurologist.      Pertinent Vitals/Pain Pain Assessment Pain Assessment: No/denies pain    Home Living                          Prior Function            PT Goals (current goals can now be found in the care plan section) Acute Rehab PT Goals Patient Stated Goal: Return to PLOF PT Goal Formulation: With patient Time For Goal Achievement: 03/15/24 Potential to Achieve Goals: Poor Progress towards PT goals: Progressing toward goals    Frequency    Min 2X/week      PT Plan      Co-evaluation              AM-PAC PT "6 Clicks" Mobility   Outcome Measure  Help needed turning from your back to your side while in a flat bed without using bedrails?: A Little Help needed moving from lying on your back to sitting on the side of a flat bed without using bedrails?: A Lot Help needed moving to and from a bed to a chair (including a wheelchair)?: A Lot Help needed standing up from a  chair using your arms (e.g., wheelchair or bedside chair)?: A Lot Help needed to walk in hospital room?: A Lot Help needed climbing 3-5 steps with a railing? : A Lot 6 Click Score: 13    End of Session Equipment Utilized During Treatment: Gait belt Activity Tolerance: Patient tolerated treatment well;Patient limited by fatigue Patient left: in bed;with call bell/phone within reach;with bed alarm set;with nursing/sitter in room;with family/visitor present Nurse Communication: Mobility status PT Visit Diagnosis: Unsteadiness on feet (R26.81);Repeated falls (R29.6);Muscle weakness (generalized) (M62.81);History of falling (Z91.81);Difficulty in walking, not elsewhere classified (R26.2)     Time: 1610-9604 PT Time Calculation (min) (ACUTE ONLY): 18 min  Charges:    $Therapeutic Activity: 8-22 mins PT General Charges $$ ACUTE PT VISIT: 1 Visit                     Aleda Grana, PT, DPT 03/03/24, 3:13 PM   Sandi Mariscal 03/03/2024, 3:11 PM

## 2024-03-03 NOTE — Procedures (Signed)
 Patient Name: James [name].  MRN: [id number]  Epilepsy Attending: Charlsie Quest  Referring Physician/Provider: Cathren Harsh, MD  Date: 03/03/2024 Duration: 22.05 mins  Patient history: 78yo M with ams. EEG to evaluate for seizure  Level of alertness: Awake  AEDs during EEG study: None  Technical aspects: This EEG study was done with scalp electrodes positioned according to the 10-20 International system of electrode placement. Electrical activity was reviewed with band pass filter of 1-70Hz , sensitivity of 7 uV/mm, display speed of 57mm/sec with a 60Hz  notched filter applied as appropriate. EEG data were recorded continuously and digitally stored.  Video monitoring was available and reviewed as appropriate.  Description: The posterior dominant rhythm consists of 7.5 Hz activity of moderate voltage (25-35 uV) seen predominantly in posterior head regions, symmetric and reactive to eye opening and eye closing. EEG showed continuous generalized 6 to 7 Hz theta slowing. Hyperventilation and photic stimulation were not performed.     ABNORMALITY - Continuous slow, generalized  IMPRESSION: This study is suggestive of mild diffuse encephalopathy. No seizures or epileptiform discharges were seen throughout the recording.  Lus Kriegel Annabelle Harman

## 2024-03-03 NOTE — Progress Notes (Deleted)
 James [name]

## 2024-03-03 NOTE — Care Management Important Message (Signed)
 Important Message  Patient Details  Name: James [name]. MRN: [id number] Date of Birth: August 04, 1946   Important Message Given:  Yes - Medicare IM     Dorena Bodo 03/03/2024, 2:18 PM

## 2024-03-03 NOTE — Plan of Care (Signed)
  Problem: Clinical Measurements: Goal: Ability to maintain clinical measurements within normal limits will improve Outcome: Progressing   Problem: Activity: Goal: Risk for activity intolerance will decrease Outcome: Progressing   Problem: Nutrition: Goal: Adequate nutrition will be maintained Outcome: Progressing   Problem: Elimination: Goal: Will not experience complications related to bowel motility Outcome: Progressing   Problem: Elimination: Goal: Will not experience complications related to bowel motility Outcome: Progressing   Problem: Elimination: Goal: Will not experience complications related to bowel motility Outcome: Progressing Goal: Will not experience complications related to urinary retention Outcome: Progressing   Problem: Pain Managment: Goal: General experience of comfort will improve and/or be controlled Outcome: Progressing   Problem: Safety: Goal: Ability to remain free from injury will improve Outcome: Progressing   Problem: Skin Integrity: Goal: Risk for impaired skin integrity will decrease Outcome: Progressing

## 2024-03-03 NOTE — Progress Notes (Signed)
 [facility]                                                                              James [name], is a 78 y.o. male, DOB - 1946/04/29, AOZ:[id number] Admit date - 02/29/2024    Outpatient Primary MD for the patient is [name], MD  LOS - 3  days  Chief Complaint  Patient presents with   Altered Mental Status   Weakness       Brief summary   Patient is a 78 year old male with HTN, hyperlipidemia, diabetes, GERD, history of splenic infarct, portal vein thrombosis, polycythemia vera, OSA, NASH cirrhosis, anemia, CML, anxiety presented with worsening weakness.  He was admitted 2/8-2/10 with altered mental status, dizziness, unsteady gait and had full neurology workup.  It was felt that his CML treatment Dasatinib was contributing to this and was discontinued.  Confusion has improved however he has had worsening generalized weakness, unsteady gait, needing help with transfers in the last 2 to 3 days.  Wife can no longer help care for him and patient is needing placement.  He was seen in the med [facility] ED on 3/13 and again presented to the hospital on 3/14. EDP also requested neurology to see the patient   Assessment & Plan    Principal Problem:   Gait abnormality Generalized debility -  Acute onset of gait abnormality with weakness and walking needing significant help with transfers.  Had some issues with his gait along with confusion a month ago which resolved.  Confusion has not returned but this weakness has been acute onset in the last 2 to 3 days. -CT head on 3/13 was negative for acute intracranial abnormality -MRI brain 3/14 showed no acute findings, age-related volume loss, chronic small vessel ischemic changes of the cerebral hemispheric white matter, old small vessel cerebellar infarctions.  Bilateral mastoid effusions. -CK normal, can resume statin -Vitamin D low, placed on replacement.  B12 normal, folate normal, has iron  deficiency -TSH normal 1.7, CRP 0.9, ESR 1. -PT OT evaluation recommended SNF -Likely also has baseline gait instability due to old cerebellar infarctions  Active problems Acute metabolic encephalopathy, with underlying cognitive dysfunction/vascular dementia -Mental status much better today, much more alert and oriented today -Ammonia level normal, no acute infectious process, likely has underlying dementia -Renal function improved, 1.2  Acute kidney injury -Creatinine 1.6 on admission, was 1.5 on 3/14, 1.4 on 3/13 and 1.3 on 2/10 -Received IV fluid hydration, creatinine now close to baseline, 1.2    Hypertension -BP somewhat elevated, patient takes propranolol for anxiety, will resume which will help with his BP as well -Add hydralazine IV as needed with parameters  Hyperlipidemia -CK normal, resumed statin   Diabetes -Continue sliding scale insulin while inpatient CBG (last 3)  Recent Labs    03/02/24 2111 03/03/24 0616 03/03/24 1258  GLUCAP 132* 125* 133*    Chronic nocturia -Continue DDAVP at night, follow sodium level   History of splenic infarct - Continue home Xarelto   Polycythemia vera, CML  Followed by oncology. Dr Myna Hidalgo outpatient.  Was on Sprycel with good response and at last  eval in February was considered to be in remission.  Sprycel was discontinued.    NASH cirrhosis LFTs stable   OSA - Continue home CPAP   Estimated body mass index is 25.5 kg/m as calculated from the following:   Height as of this encounter: 5\' 6"  (1.676 m).   Weight as of this encounter: 71.7 kg.  Code Status: DNR DVT Prophylaxis:  rivaroxaban (XARELTO) tablet 10 mg Start: 03/01/24 1000 rivaroxaban (XARELTO) tablet 10 mg   Level of Care: Level of care: Telemetry Medical Family Communication: Updated patient's son at the bedside today Disposition Plan:      Remains inpatient appropriate: PT OT evaluation recommended SNF.  Per son, [facility] their first  choice   Procedures:  None  Consultants:   Neurology  Antimicrobials:   Anti-infectives (From admission, onward)    None          Medications  buPROPion  300 mg Oral Daily   cholecalciferol  1,000 Units Oral Daily   desmopressin  400 mcg Oral QHS   insulin aspart  0-9 Units Subcutaneous TID WC   QUEtiapine  12.5 mg Oral QHS   rivaroxaban  10 mg Oral Daily   rosuvastatin  20 mg Oral QHS   sodium chloride flush  3 mL Intravenous Q12H      Subjective:   James [name] was seen and examined today.  Was resting comfortably during encounter, per son did not sleep well last night.  Somewhat more oriented but still has some cognitive dysfunction.  No fevers, nausea vomiting, chest pain or shortness of breath.  Objective:   Vitals:   03/02/24 2347 03/03/24 0354 03/03/24 0841 03/03/24 1256  BP: (!) 151/103 (!) 145/86 (!) 164/114 (!) 163/99  Pulse: 82 72 71 75  Resp: 18 18 18    Temp: (!) 97.4 F (36.3 C) (!) 97.5 F (36.4 C) 97.8 F (36.6 C)   TempSrc: Oral Oral Oral   SpO2: 94% 93% 100% (!) 63%  Weight:      Height:        Intake/Output Summary (Last 24 hours) at 03/03/2024 1327 Last data filed at 03/03/2024 0530 Gross per 24 hour  Intake 240 ml  Output 1050 ml  Net -810 ml     Wt Readings from Last 3 Encounters:  02/29/24 71.7 kg  02/28/24 71.7 kg  01/28/24 77.2 kg    Physical Exam General: Alert and oriented x self, person, recognizes his son was able to tell his son's birthday Cardiovascular: S1 S2 clear, RRR.  Respiratory: CTAB, no wheezing, rales or rhonchi Gastrointestinal: Soft, nontender, nondistended, NBS Ext: no pedal edema bilaterally Neuro: no new deficits Psych: pleasant, still has cognitive dysfunction   Data Reviewed:  I have personally reviewed following labs    CBC Lab Results  Component Value Date   WBC 8.0 03/03/2024   RBC 5.17 03/03/2024   HGB 15.4 03/03/2024   HCT 47.4 03/03/2024   MCV 91.7 03/03/2024   MCH 29.8  03/03/2024   PLT 226 03/03/2024   MCHC 32.5 03/03/2024   RDW 14.5 03/03/2024   LYMPHSABS 0.9 01/26/2024   MONOABS 0.9 01/26/2024   EOSABS 0.2 01/26/2024   BASOSABS 0.0 01/26/2024     Last metabolic panel Lab Results  Component Value Date   NA 135 03/03/2024   K 3.8 03/03/2024   CL 107 03/03/2024   CO2 22 03/03/2024   BUN 20 03/03/2024   CREATININE 1.28 (H) 03/03/2024   GLUCOSE 133 (H) 03/03/2024  GFRNONAA 57 (L) 03/03/2024   GFRAA 53 (L) 09/02/2020   CALCIUM 10.9 (H) 03/03/2024   PHOS 2.8 03/03/2024   PROT 6.4 (L) 03/01/2024   ALBUMIN 3.2 (L) 03/03/2024   LABGLOB 2.4 10/25/2016   AGRATIO 1.7 10/25/2016   BILITOT 0.8 03/01/2024   ALKPHOS 73 03/01/2024   AST 20 03/01/2024   ALT 11 03/01/2024   ANIONGAP 6 03/03/2024    CBG (last 3)  Recent Labs    03/02/24 2111 03/03/24 0616 03/03/24 1258  GLUCAP 132* 125* 133*      Coagulation Profile: No results for input(s): "INR", "PROTIME" in the last 168 hours.   Radiology Studies: I have personally reviewed the imaging studies  No results found.      Thad Ranger M.D. [facility] 03/03/2024, 1:27 PM  Available via Epic secure chat 7am-7pm After 7 pm, please refer to night coverage provider listed on amion.

## 2024-03-03 NOTE — Progress Notes (Signed)
 EEG complete - results pending

## 2024-03-03 NOTE — TOC Progression Note (Signed)
 Transition of Care (TOC) - Progression Note    Patient Details  Name: James [name]. MRN: [id number] Date of Birth: August 14, 1946  Transition of Care [facility]) CM/SW Contact  Baldemar Lenis, [place] Phone Number: 03/03/2024, 4:19 PM  Clinical Narrative:   Patient requesting [facility] or [facility] for SNF. CSW contacted both to review referrals. [facility] has no beds available, [facility] is reviewing referral and will update CSW when a determination is made. CSW to follow.    Expected Discharge Plan: Skilled Nursing Facility Barriers to Discharge: Continued Medical Work up, SNF Pending bed offer  Expected Discharge Plan and Services       Living arrangements for the past 2 months: Single Family Home                                       Social Determinants of Health (SDOH) Interventions SDOH Screenings   Food Insecurity: No Food Insecurity (02/29/2024)  Housing: Low Risk  (02/29/2024)  Transportation Needs: No Transportation Needs (02/29/2024)  Utilities: Not At Risk (02/29/2024)  Depression (PHQ2-9): Medium Risk (05/25/2020)  Social Connections: Socially Isolated (02/29/2024)  Tobacco Use: Medium Risk (02/29/2024)    Readmission Risk Interventions     No data to display

## 2024-03-04 ENCOUNTER — Telehealth: Payer: Self-pay | Admitting: Adult Health

## 2024-03-04 ENCOUNTER — Ambulatory Visit: Admitting: Neurology

## 2024-03-04 ENCOUNTER — Encounter: Payer: Self-pay | Admitting: Neurology

## 2024-03-04 DIAGNOSIS — R531 Weakness: Secondary | ICD-10-CM | POA: Diagnosis not present

## 2024-03-04 DIAGNOSIS — E0800 Diabetes mellitus due to underlying condition with hyperosmolarity without nonketotic hyperglycemic-hyperosmolar coma (NKHHC): Secondary | ICD-10-CM | POA: Diagnosis not present

## 2024-03-04 DIAGNOSIS — R269 Unspecified abnormalities of gait and mobility: Secondary | ICD-10-CM | POA: Diagnosis not present

## 2024-03-04 DIAGNOSIS — C9210 Chronic myeloid leukemia, BCR/ABL-positive, not having achieved remission: Secondary | ICD-10-CM | POA: Diagnosis not present

## 2024-03-04 LAB — HEPATIC FUNCTION PANEL
ALT: 15 U/L (ref 0–44)
AST: 16 U/L (ref 15–41)
Albumin: 3.1 g/dL — ABNORMAL LOW (ref 3.5–5.0)
Alkaline Phosphatase: 71 U/L (ref 38–126)
Bilirubin, Direct: 0.2 mg/dL (ref 0.0–0.2)
Indirect Bilirubin: 0.7 mg/dL (ref 0.3–0.9)
Total Bilirubin: 0.9 mg/dL (ref 0.0–1.2)
Total Protein: 6.2 g/dL — ABNORMAL LOW (ref 6.5–8.1)

## 2024-03-04 LAB — CBC
HCT: 52.3 % — ABNORMAL HIGH (ref 39.0–52.0)
Hemoglobin: 17 g/dL (ref 13.0–17.0)
MCH: 29.8 pg (ref 26.0–34.0)
MCHC: 32.5 g/dL (ref 30.0–36.0)
MCV: 91.6 fL (ref 80.0–100.0)
Platelets: 213 10*3/uL (ref 150–400)
RBC: 5.71 MIL/uL (ref 4.22–5.81)
RDW: 14.4 % (ref 11.5–15.5)
WBC: 9.5 10*3/uL (ref 4.0–10.5)
nRBC: 0 % (ref 0.0–0.2)

## 2024-03-04 LAB — GLUCOSE, CAPILLARY
Glucose-Capillary: 167 mg/dL — ABNORMAL HIGH (ref 70–99)
Glucose-Capillary: 212 mg/dL — ABNORMAL HIGH (ref 70–99)
Glucose-Capillary: 113 mg/dL — ABNORMAL HIGH (ref 70–99)
Glucose-Capillary: 187 mg/dL — ABNORMAL HIGH (ref 70–99)

## 2024-03-04 LAB — RENAL FUNCTION PANEL
Albumin: 3.4 g/dL — ABNORMAL LOW (ref 3.5–5.0)
Anion gap: 8 (ref 5–15)
BUN: 20 mg/dL (ref 8–23)
CO2: 20 mmol/L — ABNORMAL LOW (ref 22–32)
Calcium: 11.1 mg/dL — ABNORMAL HIGH (ref 8.9–10.3)
Chloride: 107 mmol/L (ref 98–111)
Creatinine, Ser: 1.31 mg/dL — ABNORMAL HIGH (ref 0.61–1.24)
GFR, Estimated: 56 mL/min — ABNORMAL LOW (ref >60)
Glucose, Bld: 166 mg/dL — ABNORMAL HIGH (ref 70–99)
Phosphorus: 2.6 mg/dL (ref 2.5–4.6)
Potassium: 4.6 mmol/L (ref 3.5–5.1)
Sodium: 135 mmol/L (ref 135–145)

## 2024-03-04 LAB — VITAMIN B1: Vitamin B1 (Thiamine): 180.4 nmol/L (ref 66.5–200.0)

## 2024-03-04 MED ORDER — INSULIN ASPART 100 UNIT/ML IJ SOLN
0.0000 [IU] | Freq: Three times a day (TID) | INTRAMUSCULAR | Status: DC
  Administered 2024-03-05: 3 [IU] via SUBCUTANEOUS
  Administered 2024-03-05 – 2024-03-07 (×5): 2 [IU] via SUBCUTANEOUS
  Administered 2024-03-07 (×2): 5 [IU] via SUBCUTANEOUS
  Administered 2024-03-08 (×2): 2 [IU] via SUBCUTANEOUS

## 2024-03-04 MED ORDER — INSULIN ASPART 100 UNIT/ML IJ SOLN: 0.0000 [IU] | Freq: Every day | INTRAMUSCULAR | Status: DC

## 2024-03-04 MED ORDER — QUETIAPINE FUMARATE 25 MG PO TABS
25.0000 mg | ORAL_TABLET | Freq: Every day | ORAL | Status: DC
  Administered 2024-03-04: 25 mg via ORAL
  Filled 2024-03-04: qty 1

## 2024-03-04 MED ORDER — SODIUM CHLORIDE 0.9 % IV SOLN: INTRAVENOUS | Status: AC

## 2024-03-04 NOTE — Plan of Care (Signed)
  Problem: Education: Goal: Knowledge of General Education information will improve Description: Including pain rating scale, medication(s)/side effects and non-pharmacologic comfort measures Outcome: Progressing   Problem: Clinical Measurements: Goal: Ability to maintain clinical measurements within normal limits will improve Outcome: Progressing   Problem: Clinical Measurements: Goal: Ability to maintain clinical measurements within normal limits will improve Outcome: Progressing Goal: Will remain free from infection Outcome: Progressing Goal: Diagnostic test results will improve Outcome: Progressing Goal: Respiratory complications will improve Outcome: Progressing Goal: Cardiovascular complication will be avoided Outcome: Progressing   Problem: Nutrition: Goal: Adequate nutrition will be maintained Outcome: Progressing

## 2024-03-04 NOTE — Telephone Encounter (Signed)
 Patient is in hospital for placement into nursing home. See ED note .

## 2024-03-04 NOTE — TOC Progression Note (Signed)
 Transition of Care (TOC) - Progression Note    Patient Details  Name: Elmor Kost. MRN: [id number] Date of Birth: 04/11/1946  [facility] of [facility]) CM/SW Contact  Baldemar Lenis, [place] Phone Number: 03/04/2024, 3:35 PM  Clinical Narrative:   CSW received update from [facility] this morning that they are not able to offer a bed for the patient. CSW met with patient's son and wife at bedside to update on bed offers and answer questions. Lengthy discussion with family about SNF placement, options available, insurance coverage, transportation, and preparing for care after SNF placement. Family very appreciative of information. Family asking about [facility]. CSW contacted [facility] to ask them to review referral, they are unable to offer. CSW met with family again, they would like to choose [facility]. CSW confirmed bed availability with [facility] when patient is stable. CSW to follow.    Expected Discharge Plan: Skilled Nursing Facility Barriers to Discharge: Continued Medical Work up, SNF Pending bed offer  Expected Discharge Plan and Services       Living arrangements for the past 2 months: Single Family Home                                       Social Determinants of Health (SDOH) Interventions SDOH Screenings   Food Insecurity: No Food Insecurity (02/29/2024)  Housing: Low Risk  (02/29/2024)  Transportation Needs: No Transportation Needs (02/29/2024)  Utilities: Not At Risk (02/29/2024)  Depression (PHQ2-9): Medium Risk (05/25/2020)  Social Connections: Socially Isolated (02/29/2024)  Tobacco Use: Medium Risk (02/29/2024)    Readmission Risk Interventions     No data to display

## 2024-03-04 NOTE — Progress Notes (Addendum)
 [facility]                                                                              James [name], is a 78 y.o. male, DOB - 28-Oct-1946, ZHY:[id number] Admit date - 02/29/2024    Outpatient Primary MD for the patient is [name], MD  LOS - 4  days  Chief Complaint  Patient presents with   Altered Mental Status   Weakness       Brief summary   Patient is a 78 year old male with HTN, hyperlipidemia, diabetes, GERD, history of splenic infarct, portal vein thrombosis, polycythemia vera, OSA, NASH cirrhosis, anemia, CML, anxiety presented with worsening weakness.  He was admitted 2/8-2/10 with altered mental status, dizziness, unsteady gait and had full neurology workup.  It was felt that his CML treatment Dasatinib was contributing to this and was discontinued.  Confusion has improved however he has had worsening generalized weakness, unsteady gait, needing help with transfers in the last 2 to 3 days.  Wife can no longer help care for him and patient is needing placement.  He was seen in the [facility] ED on 3/13 and again presented to the hospital on 3/14. EDP also requested neurology to see the patient   Assessment & Plan    Principal Problem:   Gait abnormality Generalized debility -  Acute onset of gait abnormality with weakness and walking needing significant help with transfers.  Had some issues with his gait along with confusion a month ago which resolved.  Confusion has not returned but this weakness has been acute onset in the last 2 to 3 days. -CT head on 3/13 was negative for acute intracranial abnormality -MRI brain 3/14 showed no acute findings, age-related volume loss, chronic small vessel ischemic changes of the cerebral hemispheric white matter, old small vessel cerebellar infarctions.  Bilateral mastoid effusions. -CK normal, can resume statin -Vitamin D low, placed on replacement.  B12 normal, folate normal, has iron  deficiency -TSH normal 1.7, CRP 0.9, ESR 1. -PT OT evaluation recommended SNF -Likely also has baseline gait instability due to old cerebellar infarctions  Active problems Acute metabolic encephalopathy, with underlying cognitive dysfunction/vascular dementia -Ammonia level normal, no acute infectious process, likely has underlying dementia -Creatinine 1.31 with hemoglobin 17.0, appears to be somewhat dehydrated, will place on IV fluids -EKG repeated, QTc 446 -EEG showed mild diffuse encephalopathy, no seizures or epileptiform discharges -Overnight received Haldol x 2 consecutive nights, will increase bedtime Seroquel  Acute kidney injury -Creatinine 1.6 on admission, was 1.5 on 3/14, 1.4 on 3/13 and 1.3 on 2/10 -Creatinine 1.8, hemoglobin 17.0, appears to be somewhat dehydrated, placed on IV fluids  Mild hypercalcemia, chronic -Calcium 11.5 on 01/28/2024, today 11.1, corrected calcium with albumin is 11.6, likely due to dehydration and hemoconcentration -For completion of workup, will obtain PTH, PTH RP, 1 25 diOH vitamin D level -Placed on IV fluid hydration   Hypertension -BP stable  -Continue hydralazine IV as needed with parameters  Hyperlipidemia -CK normal, resumed statin   Diabetes -Continue sliding scale insulin while inpatient CBG (last 3)  Recent Labs    03/03/24  2140 03/04/24 0610 03/04/24 1109  GLUCAP 192* 167* 212*  -Increase to moderate sliding scale insulin  Chronic nocturia -Continue DDAVP at night, follow sodium level   History of splenic infarct - Continue home Xarelto   Polycythemia vera, CML  Followed by oncology. Dr Myna Hidalgo outpatient.  Was on Sprycel with good response and at last eval in February was considered to be in remission.  Sprycel was discontinued.    NASH cirrhosis LFTs stable   OSA - Continue home CPAP   Estimated body mass index is 25.5 kg/m as calculated from the following:   Height as of this encounter: 5\' 6"  (1.676 m).    Weight as of this encounter: 71.7 kg.  Code Status: DNR DVT Prophylaxis:  rivaroxaban (XARELTO) tablet 10 mg Start: 03/01/24 1000 rivaroxaban (XARELTO) tablet 10 mg   Level of Care: Level of care: Telemetry Medical Family Communication: Updated patient's son at the bedside today Disposition Plan:      Remains inpatient appropriate: PT OT evaluation recommended SNF   Procedures:  None  Consultants:   Neurology   Antimicrobials:   Anti-infectives (From admission, onward)    None          Medications  buPROPion  300 mg Oral Daily   cholecalciferol  1,000 Units Oral Daily   desmopressin  400 mcg Oral QHS   insulin aspart  0-9 Units Subcutaneous TID WC   propranolol  10 mg Oral Daily   QUEtiapine  12.5 mg Oral QHS   rivaroxaban  10 mg Oral Daily   rosuvastatin  20 mg Oral QHS   sodium chloride flush  3 mL Intravenous Q12H      Subjective:   James [name] was seen and examined today.  Resting comfortably, overnight received Haldol for agitation, today somewhat somnolent but easily arousable, oriented to self and person.  Son at the bedside.  Objective:   Vitals:   03/03/24 2015 03/03/24 2017 03/04/24 0750 03/04/24 1109  BP: (!) 102/58  131/80 109/73  Pulse: (!) 118  83 76  Resp: 17  20 18   Temp: 98 F (36.7 C)  97.9 F (36.6 C) 98.3 F (36.8 C)  TempSrc:   Oral Oral  SpO2: (!) 86% 93% 93% 97%  Weight:      Height:        Intake/Output Summary (Last 24 hours) at 03/04/2024 1348 Last data filed at 03/04/2024 0750 Gross per 24 hour  Intake --  Output 1525 ml  Net -1525 ml     Wt Readings from Last 3 Encounters:  02/29/24 71.7 kg  02/28/24 71.7 kg  01/28/24 77.2 kg   Physical Exam General: Somewhat somnolent but easily arousable, oriented to self and person. Cardiovascular: S1 S2 clear, RRR.  Respiratory: CTAB, no wheezing Gastrointestinal: Soft, nontender, nondistended, NBS Ext: no pedal edema bilaterally Neuro: somewhat somnolent Psych:  sundowning, cognitive dysfunction  Data Reviewed:  I have personally reviewed following labs    CBC Lab Results  Component Value Date   WBC 9.5 03/04/2024   RBC 5.71 03/04/2024   HGB 17.0 03/04/2024   HCT 52.3 (H) 03/04/2024   MCV 91.6 03/04/2024   MCH 29.8 03/04/2024   PLT 213 03/04/2024   MCHC 32.5 03/04/2024   RDW 14.4 03/04/2024   LYMPHSABS 0.9 01/26/2024   MONOABS 0.9 01/26/2024   EOSABS 0.2 01/26/2024   BASOSABS 0.0 01/26/2024     Last metabolic panel Lab Results  Component Value Date   NA 135 03/04/2024  K 4.6 03/04/2024   CL 107 03/04/2024   CO2 20 (L) 03/04/2024   BUN 20 03/04/2024   CREATININE 1.31 (H) 03/04/2024   GLUCOSE 166 (H) 03/04/2024   GFRNONAA 56 (L) 03/04/2024   GFRAA 53 (L) 09/02/2020   CALCIUM 11.1 (H) 03/04/2024   PHOS 2.6 03/04/2024   PROT 6.4 (L) 03/01/2024   ALBUMIN 3.4 (L) 03/04/2024   LABGLOB 2.4 10/25/2016   AGRATIO 1.7 10/25/2016   BILITOT 0.8 03/01/2024   ALKPHOS 73 03/01/2024   AST 20 03/01/2024   ALT 11 03/01/2024   ANIONGAP 8 03/04/2024    CBG (last 3)  Recent Labs    03/03/24 2140 03/04/24 0610 03/04/24 1109  GLUCAP 192* 167* 212*      Coagulation Profile: No results for input(s): "INR", "PROTIME" in the last 168 hours.   Radiology Studies: I have personally reviewed the imaging studies  EEG adult Result Date: 03/03/2024 Charlsie Quest, MD     03/03/2024  2:24 PM Patient Name: Rise Paganini. MRN: [id number] Epilepsy Attending: Charlsie Quest Referring Physician/Provider: Cathren Harsh, MD Date: 02/16/2024 Duration: 22.05 mins Patient history: 78yo M with ams. EEG to evaluate for seizure Level of alertness: Awake AEDs during EEG study: None Technical aspects: This EEG study was done with scalp electrodes positioned according to the 10-20 International system of electrode placement. Electrical activity was reviewed with band pass filter of 1-70Hz , sensitivity of 7 uV/mm, display speed of 87mm/sec with a 60Hz   notched filter applied as appropriate. EEG data were recorded continuously and digitally stored.  Video monitoring was available and reviewed as appropriate. Description: The posterior dominant rhythm consists of 7.5 Hz activity of moderate voltage (25-35 uV) seen predominantly in posterior head regions, symmetric and reactive to eye opening and eye closing. EEG showed continuous generalized 6 to 7 Hz theta slowing. Hyperventilation and photic stimulation were not performed.   ABNORMALITY - Continuous slow, generalized IMPRESSION: This study is suggestive of mild diffuse encephalopathy. No seizures or epileptiform discharges were seen throughout the recording. Priyanka Renee Pain M.D. [facility] 03/04/2024, 1:48 PM  Available via Epic secure chat 7am-7pm After 7 pm, please refer to night coverage provider listed on amion.

## 2024-03-04 NOTE — Telephone Encounter (Signed)
 Pt's son, Vaun Hyndman notified patient has been hospitalized. Unable to come to appointment

## 2024-03-04 NOTE — Evaluation (Signed)
 Speech Language Pathology Evaluation Patient Details Name: James [name]. 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 01/30/2023   CPAP use counseling 01/30/2023   Acute metabolic encephalopathy 09/03/2021   Acute encephalopathy 09/02/2021   Type 2 diabetes mellitus with hyperglycemia (HCC) 09/02/2021   SIRS (systemic inflammatory response syndrome) (HCC) 09/02/2021   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   OSA on CPAP 10/30/2017   Hypogonadism, male 10/30/2017   Iron deficiency anemia due to chronic blood loss 11/20/2016   Erosive gastritis with hemorrhage    Melena    Acute blood loss anemia 09/27/2016   Acute upper GI bleed 09/27/2016   Abnormal liver diagnostic imaging 06/22/2016   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/03/2016   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   Portal vein thrombosis 04/29/2016   DM (diabetes mellitus) (HCC) 04/29/2016   Leukocytosis 04/29/2016   Dyspnea 12/31/2015   HTN (hypertension) 12/31/2015   Hyperlipidemia 12/31/2015   Nocturia more than twice per night 11/11/2014   Severe obesity (BMI >= 40) (HCC) 11/11/2014   Hypersomnia with sleep apnea 11/11/2014   Polycythemia vera (HCC) 11/29/2011   Past Medical History:  Past Medical History:  Diagnosis Date   Anxiety    Arthritis    Clotting disorder (HCC)    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)    History   Portal vein thrombosis    Rhinitis    Situational depression    Sleep apnea    uses CPAP every night   Tubular adenoma 12/10/2015   6 cecum polyps   Past Surgical History:  Past Surgical History:  Procedure Laterality Date   CARDIAC CATHETERIZATION     greater 10 yrs ago, normal   CARPAL TUNNEL RELEASE     bilateral   COLONOSCOPY  06/2005   [facility] [place]   ESOPHAGOGASTRODUODENOSCOPY (EGD) WITH PROPOFOL N/A 09/28/2016   Procedure: ESOPHAGOGASTRODUODENOSCOPY (EGD) WITH PROPOFOL;  Surgeon: Iva Boop, MD;  Location: [facility];  Service: Endoscopy;  Laterality: N/A;   IR GENERIC HISTORICAL  12/12/2016   IR US GUIDE VASC ACCESS RIGHT 12/12/2016 Jolaine Click, MD [facility]   IR GENERIC HISTORICAL  12/12/2016   IR VENOGRAM HEPATIC W HEMODYNAMIC EVALUATION 12/12/2016 Jolaine Click, MD [facility]   IR GENERIC HISTORICAL  12/12/2016   IR TRANSCATHETER BX 12/12/2016 Jolaine Click, MD [facility]   REVERSE SHOULDER ARTHROPLASTY Right 09/01/2021   Procedure: REVERSE SHOULDER ARTHROPLASTY;  Surgeon: Francena Hanly, MD;  Location: [facility];  Service: Orthopedics;  Laterality: Right;  ,   ROTATOR CUFF REPAIR     right   TENDON REPAIR     left arm   TONSILLECTOMY     TOTAL KNEE ARTHROPLASTY Right 2010   WISDOM TOOTH EXTRACTION     HPI:  Patient is a 78 y.o. male with PMH: HTN, HLD, DM, GERD, splenic infarct, portal vein thrombosis, polycythemia, OSA, NASH cirrhosis, anemia, anxiety. He presented to the hospital on 02/29/24 with AMS, worsening weakness, dizziness, unsteady gait. It has been increasingly difficult for his spouse to care for him. Patient has been exhibiting increasing  agitation directed at spouse at nighttime. He was admitted with gait abnormality, generalized debility, acute metabolic encephalopathy with underlying cognitive dysfunction/vascular dementia. MRI brain negative for acute finding but did show moderate chronic small-vessel ischemic changes of the cerebral hemispheric white matter.   Assessment / Plan / Recommendation Clinical Impression  Patient presents with a severe cognitive impairment as per this evaluation. When SLP arrived into room, spouse sitting with patient and informed SLP that he had been agitated last night and trying to get out of the bed so he was given Haldol around 2am and he has been pretty much sleeping since then. Patient was awake, calm. He was oriented to self and although he initially did not demonstrate awareness to place when SLP asked, when his wife then asked him he did state "hospital". His voice was low in intensity and he had frequent loss of voicing when responding to questions or commenting. Overall amount of verbalizations was low in frequency. Per spouse, patient is still able to feed self but does need some assistance. He has not eaten anything today since he has been sleeping but previous date he demonstrated a good appetite. SLP spoke with spouse about managing behaviors of agitation and dementia, much of which she is already doing. (speaking in a calm voice, redirecting as needed). SLP also suggested she try reminiscing with patient using photo albums. Spouse was thankful for being able to spend some quality time with patient today. SLP is recommending f/u skilled services at next venue of care.    SLP Assessment  SLP Recommendation/Assessment: All further Speech Lanaguage Pathology  needs can be addressed in the next venue of care SLP Visit Diagnosis: Cognitive communication deficit (R41.841)    Recommendations for follow up therapy are one component of a multi-disciplinary discharge planning process, led by the  attending physician.  Recommendations may be updated based on patient status, additional functional criteria and insurance authorization.    Follow Up Recommendations  Skilled nursing-short term rehab (<3 hours/day)    Assistance Recommended at Discharge  Frequent or constant Supervision/Assistance  Functional Status Assessment Patient has had a recent decline in their functional status and demonstrates the ability to make significant improvements in function in a reasonable and predictable amount of time.  Frequency and Duration           SLP Evaluation Cognition  Overall Cognitive Status: Difficult to assess Arousal/Alertness: Awake/alert Orientation Level: Oriented to person;Disoriented to place;Disoriented to time;Disoriented to situation Attention: Sustained Sustained Attention: Impaired Sustained Attention Impairment: Verbal basic Memory: Impaired Awareness: Impaired Awareness Impairment: Intellectual impairment Behaviors: Other (comment) (has had periods of restlessness, agitation, etc.) Safety/Judgment: Impaired       Comprehension  Auditory Comprehension Overall Auditory Comprehension: Impaired    Expression Expression Primary Mode of Expression: Verbal Verbal Expression Overall Verbal Expression: Impaired   Oral / Motor  Oral Motor/Sensory Function Overall Oral Motor/Sensory Function: Within functional limits Motor Speech  Overall Motor Speech: Appears within functional limits for tasks assessed Phonation: Low vocal intensity Intelligibility: Intelligibility reduced           Angela Nevin, MA, CCC-SLP Speech Therapy

## 2024-03-05 ENCOUNTER — Inpatient Hospital Stay (HOSPITAL_COMMUNITY)

## 2024-03-05 DIAGNOSIS — F419 Anxiety disorder, unspecified: Secondary | ICD-10-CM | POA: Diagnosis not present

## 2024-03-05 DIAGNOSIS — R269 Unspecified abnormalities of gait and mobility: Secondary | ICD-10-CM | POA: Diagnosis not present

## 2024-03-05 DIAGNOSIS — C9210 Chronic myeloid leukemia, BCR/ABL-positive, not having achieved remission: Secondary | ICD-10-CM | POA: Diagnosis not present

## 2024-03-05 DIAGNOSIS — K2100 Gastro-esophageal reflux disease with esophagitis, without bleeding: Secondary | ICD-10-CM

## 2024-03-05 LAB — CBC
HCT: 45.5 % (ref 39.0–52.0)
Hemoglobin: 14.8 g/dL (ref 13.0–17.0)
MCH: 30 pg (ref 26.0–34.0)
MCHC: 32.5 g/dL (ref 30.0–36.0)
MCV: 92.1 fL (ref 80.0–100.0)
Platelets: 208 10*3/uL (ref 150–400)
RBC: 4.94 MIL/uL (ref 4.22–5.81)
RDW: 14.6 % (ref 11.5–15.5)
WBC: 9.2 10*3/uL (ref 4.0–10.5)
nRBC: 0 % (ref 0.0–0.2)

## 2024-03-05 LAB — GLUCOSE, CAPILLARY
Glucose-Capillary: 146 mg/dL — ABNORMAL HIGH (ref 70–99)
Glucose-Capillary: 163 mg/dL — ABNORMAL HIGH (ref 70–99)
Glucose-Capillary: 113 mg/dL — ABNORMAL HIGH (ref 70–99)
Glucose-Capillary: 157 mg/dL — ABNORMAL HIGH (ref 70–99)

## 2024-03-05 LAB — COMPREHENSIVE METABOLIC PANEL
ALT: 13 U/L (ref 0–44)
AST: 14 U/L — ABNORMAL LOW (ref 15–41)
Albumin: 3 g/dL — ABNORMAL LOW (ref 3.5–5.0)
Alkaline Phosphatase: 67 U/L (ref 38–126)
Anion gap: 3 — ABNORMAL LOW (ref 5–15)
BUN: 25 mg/dL — ABNORMAL HIGH (ref 8–23)
CO2: 22 mmol/L (ref 22–32)
Calcium: 10.6 mg/dL — ABNORMAL HIGH (ref 8.9–10.3)
Chloride: 112 mmol/L — ABNORMAL HIGH (ref 98–111)
Creatinine, Ser: 1.55 mg/dL — ABNORMAL HIGH (ref 0.61–1.24)
GFR, Estimated: 46 mL/min — ABNORMAL LOW (ref >60)
Glucose, Bld: 151 mg/dL — ABNORMAL HIGH (ref 70–99)
Potassium: 4 mmol/L (ref 3.5–5.1)
Sodium: 137 mmol/L (ref 135–145)
Total Bilirubin: 0.8 mg/dL (ref 0.0–1.2)
Total Protein: 5.7 g/dL — ABNORMAL LOW (ref 6.5–8.1)

## 2024-03-05 LAB — CALCITRIOL (1,25 DI-OH VIT D): Vit D, 1,25-Dihydroxy: 16.2 pg/mL — ABNORMAL LOW (ref 24.8–81.5)

## 2024-03-05 MED ORDER — ZOLEDRONIC ACID 4 MG/100ML IV SOLN
4.0000 mg | Freq: Once | INTRAVENOUS | Status: AC
Start: 2024-03-05 — End: 2024-03-05
  Administered 2024-03-05: 4 mg via INTRAVENOUS
  Filled 2024-03-05: qty 100

## 2024-03-05 MED ORDER — LORAZEPAM 2 MG/ML IJ SOLN
1.0000 mg | Freq: Once | INTRAMUSCULAR | Status: AC
  Administered 2024-03-05: 1 mg via INTRAVENOUS
  Filled 2024-03-05: qty 1

## 2024-03-05 MED ORDER — QUETIAPINE FUMARATE 25 MG PO TABS
25.0000 mg | ORAL_TABLET | Freq: Every day | ORAL | Status: DC
  Administered 2024-03-07: 25 mg via ORAL
  Filled 2024-03-05 (×2): qty 1

## 2024-03-05 MED ORDER — SODIUM CHLORIDE 0.9 % IV SOLN: INTRAVENOUS | Status: AC

## 2024-03-05 MED ORDER — STROKE: EARLY STAGES OF RECOVERY BOOK
Freq: Once | Status: AC
  Filled 2024-03-05: qty 1

## 2024-03-05 NOTE — Evaluation (Signed)
 Clinical/Bedside Swallow Evaluation Patient Details  Name: James [name]. 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)    History   Portal vein thrombosis    Rhinitis    Situational depression    Sleep apnea    uses CPAP every night   Tubular adenoma 12/10/2015   6 cecum polyps   Past Surgical History:  Past Surgical History:  Procedure Laterality Date   CARDIAC CATHETERIZATION     greater 10 yrs ago, normal   CARPAL TUNNEL RELEASE     bilateral   COLONOSCOPY  06/2005   [facility] [place]   ESOPHAGOGASTRODUODENOSCOPY (EGD) WITH PROPOFOL N/A 09/28/2016   Procedure: ESOPHAGOGASTRODUODENOSCOPY (EGD) WITH PROPOFOL;  Surgeon: Iva Boop, MD;  Location: [facility];  Service: Endoscopy;  Laterality: N/A;   IR GENERIC HISTORICAL  12/12/2016   IR US GUIDE VASC ACCESS RIGHT 12/12/2016 Jolaine Click, MD [facility]   IR GENERIC HISTORICAL  12/12/2016   IR VENOGRAM HEPATIC W HEMODYNAMIC EVALUATION 12/12/2016 Jolaine Click, MD [facility]   IR GENERIC HISTORICAL  12/12/2016   IR TRANSCATHETER BX 12/12/2016 Jolaine Click, MD [facility]   REVERSE SHOULDER ARTHROPLASTY Right 09/01/2021   Procedure: REVERSE SHOULDER ARTHROPLASTY;  Surgeon:  Francena Hanly, MD;  Location: [facility];  Service: Orthopedics;  Laterality: Right;  ,   ROTATOR CUFF REPAIR     right   TENDON REPAIR     left arm   TONSILLECTOMY     TOTAL KNEE ARTHROPLASTY Right 2010   WISDOM TOOTH EXTRACTION     HPI:  Patient is a 78 y.o. male with PMH: HTN, HLD, DM, GERD, splenic infarct, portal vein thrombosis, polycythemia, OSA, NASH cirrhosis, anemia, anxiety. He presented to the hospital on 02/29/24 with AMS, worsening weakness, dizziness, unsteady gait. It has been increasingly difficult for his spouse to care for him. Patient has been exhibiting increasing agitation directed at spouse at nighttime. He was admitted with gait abnormality, generalized debility, acute metabolic encephalopathy with underlying cognitive dysfunction/vascular dementia. MRI brain negative for acute finding but did show moderate chronic small-vessel ischemic changes of the cerebral hemispheric white matter. Had an acute neurological change 3/19 with decreased command following and R facial droop. CTH negative for acute abnormality. MRI pending. Failed yale swallow screen and SLP consult was subsequently placed.    Assessment / Plan / Recommendation  Clinical Impression  Pt reports with increased disorientation per family and decreased ability to follow commands. He followed <10% of commands to complete an oral motor exam, although function appears grossly intact. He coughed immediately following sips of thin liquids. Pt also presents  with oral deficits characterized by oral holding and prolonged oral transit with thin liquids and purees. Given pt's mentation and acute signs concerning for aspiration, recommend he remain NPO except meds which can be given crushed in puree. SLP will f/u. SLP Visit Diagnosis: Dysphagia, unspecified (R13.10)    Aspiration Risk  Moderate aspiration risk    Diet Recommendation NPO except meds    Medication Administration: Crushed with puree    Other   Recommendations Oral Care Recommendations: Oral care QID;Staff/trained caregiver to provide oral care    Recommendations for follow up therapy are one component of a multi-disciplinary discharge planning process, led by the attending physician.  Recommendations may be updated based on patient status, additional functional criteria and insurance authorization.  Follow up Recommendations Skilled nursing-short term rehab (<3 hours/day)      Assistance Recommended at Discharge    Functional Status Assessment Patient has had a recent decline in their functional status and demonstrates the ability to make significant improvements in function in a reasonable and predictable amount of time.  Frequency and Duration min 2x/week  2 weeks       Prognosis Prognosis for improved oropharyngeal function: Good Barriers to Reach Goals: Cognitive deficits;Time post onset      Swallow Study   General HPI: Patient is a 78 y.o. male with PMH: HTN, HLD, DM, GERD, splenic infarct, portal vein thrombosis, polycythemia, OSA, NASH cirrhosis, anemia, anxiety. He presented to the hospital on 02/29/24 with AMS, worsening weakness, dizziness, unsteady gait. It has been increasingly difficult for his spouse to care for him. Patient has been exhibiting increasing agitation directed at spouse at nighttime. He was admitted with gait abnormality, generalized debility, acute metabolic encephalopathy with underlying cognitive dysfunction/vascular dementia. MRI brain negative for acute finding but did show moderate chronic small-vessel ischemic changes of the cerebral hemispheric white matter. Had an acute neurological change 3/19 with decreased command following and R facial droop. CTH negative for acute abnormality. MRI pending. Failed yale swallow screen and SLP consult was subsequently placed. Type of Study: Bedside Swallow Evaluation Previous Swallow Assessment: WFL BSE in 2022 Diet Prior to this Study: NPO Temperature Spikes  Noted: No Respiratory Status: Room air History of Recent Intubation: No Behavior/Cognition: Alert;Confused;Requires cueing;Distractible Oral Cavity Assessment: Within Functional Limits Oral Care Completed by SLP: No Oral Cavity - Dentition: Adequate natural dentition Vision: Functional for self-feeding Self-Feeding Abilities: Total assist Patient Positioning: Upright in bed Baseline Vocal Quality: Low vocal intensity Volitional Cough: Cognitively unable to elicit Volitional Swallow: Unable to elicit    Oral/Motor/Sensory Function Overall Oral Motor/Sensory Function: Within functional limits   Ice Chips Ice chips: Not tested   Thin Liquid Thin Liquid: Impaired Presentation: Straw Oral Phase Functional Implications: Oral holding;Prolonged oral transit Pharyngeal  Phase Impairments: Wet Vocal Quality;Cough - Immediate;Suspected delayed Swallow    Nectar Thick Nectar Thick Liquid: Not tested   Honey Thick Honey Thick Liquid: Not tested   Puree Puree: Impaired Presentation: Spoon Oral Phase Functional Implications: Prolonged oral transit;Oral holding Pharyngeal Phase Impairments: Suspected delayed Swallow;Throat Clearing - Immediate   Solid     Solid: Not tested      Gwynneth Aliment, M.A., CF-SLP Speech Language Pathology, Acute Rehabilitation Services  Secure Chat preferred [phone]  03/05/2024,3:45 PM

## 2024-03-05 NOTE — Progress Notes (Signed)
 Occupational Therapy Treatment Patient Details Name: James [name]. MRN: [id number] DOB: 1946-07-05 Today's Date: 03/05/2024   History of present illness 78 y/o male presents to [facility] on 3/14 with progressive weakness and confusion. Patient was recently admitted 2/8 until 2/10 with altered mental status, dizziness, unsteady gait. PMH includes: hypertension, hyperlipidemia, diabetes, GERD, splenic infarct, portal vein thrombosis, polycythemia vera, OSA, NASH cirrhosis, anemia, CML, anxiety   OT comments  During today's session with OT/PT, pt noted to not be following commands and not able to maintain static sitting balance. He demonstrated L inattention with R gaze preference and lean, not answering orientation questions but responds to name. Pt not using BUEs functionally to hold cup  but did use them spontaneously. Pt returned to supine with vitals assessed, RN present and aware. OT to follow-up with pt as able, would benefit from functional reassessment next session pending results on new clinical presentation. Will leave post acute recs for SNF at this time.       If plan is discharge home, recommend the following:  A little help with walking and/or transfers;A little help with bathing/dressing/bathroom;Assistance with cooking/housework;Supervision due to cognitive status;Direct supervision/assist for medications management;Assist for transportation;Direct supervision/assist for financial management   Equipment Recommendations  None recommended by OT (defer)    Recommendations for Other Services      Precautions / Restrictions Precautions Precautions: Fall Recall of Precautions/Restrictions: Impaired Restrictions Weight Bearing Restrictions Per Provider Order: No       Mobility Bed Mobility Overal bed mobility: Needs Assistance Bed Mobility: Supine to Sit, Sit to Supine     Supine to sit: Max assist, +2 for safety/equipment, HOB elevated Sit to supine: Total assist, +2 for  safety/equipment, +2 for physical assistance   General bed mobility comments: pt demonstrating impaired processing and no initiation to move supine>sit. Pt required Max assist for LE's and to raise trunk to EOB. Max assist to maintain seated balance with heavy Rt lean and Rt facial droop. Total assist to return to supine and boost in bed. RN called to room and vital signs taken. Cerivcal lateral flexion to the R, needing OT assist to position neck into midline but pt unable to maintain    Transfers                         Balance Overall balance assessment: Needs assistance Sitting-balance support: Feet supported, Bilateral upper extremity supported Sitting balance-Leahy Scale: Poor Sitting balance - Comments: Rt lean and posterior lean, max assist Postural control: Posterior lean, Right lateral lean                                 ADL either performed or assessed with clinical judgement   ADL                                         General ADL Comments: Not able to perform ADLs, possible code stroke    Extremity/Trunk Assessment Upper Extremity Assessment Upper Extremity Assessment:  (not following commands, pt only able to squeeze hands but bilat strength equivalant. Lifted BUEs against gravity to bring to chest.)            Vision   Additional Comments: R gaze preference, L inattention present   Perception     Praxis  Communication Communication Communication: Impaired Factors Affecting Communication: Reduced clarity of speech;Difficulty expressing self;Hearing impaired   Cognition Arousal: Lethargic Behavior During Therapy: Flat affect Cognition: Difficult to assess Difficult to assess due to: Level of arousal           OT - Cognition Comments: Pt with different clinical presentation than on IE, not following commands and very lethargic.                 Following commands: Impaired Following commands  impaired:  (only command he would follow was "squeeze my hands")      Cueing   Cueing Techniques: Verbal cues, Gestural cues, Tactile cues  Exercises      Shoulder Instructions       General Comments Bp in supine 192/107(134), Sp02 pleth not well but Sp02 displaying >91%, unable to get temprature. RN in room and aware.    Pertinent Vitals/ Pain       Pain Assessment Pain Assessment: PAINAD Breathing: normal Negative Vocalization: none Facial Expression: smiling or inexpressive Body Language: relaxed Consolability: no need to console PAINAD Score: 0  Home Living                                          Prior Functioning/Environment              Frequency  Min 2X/week        Progress Toward Goals  OT Goals(current goals can now be found in the care plan section)  Progress towards OT goals: OT to reassess next treatment  Acute Rehab OT Goals OT Goal Formulation: With patient Time For Goal Achievement: 03/15/24 Potential to Achieve Goals: Good  Plan      Co-evaluation          OT goals addressed during session: Strengthening/ROM      AM-PAC OT "6 Clicks" Daily Activity     Outcome Measure   Help from another person eating meals?: A Little Help from another person taking care of personal grooming?: A Little Help from another person toileting, which includes using toliet, bedpan, or urinal?: A Lot Help from another person bathing (including washing, rinsing, drying)?: A Little Help from another person to put on and taking off regular upper body clothing?: A Little Help from another person to put on and taking off regular lower body clothing?: A Lot 6 Click Score: 16    End of Session    OT Visit Diagnosis: Unsteadiness on feet (R26.81);Other abnormalities of gait and mobility (R26.89);History of falling (Z91.81);Muscle weakness (generalized) (M62.81);Other symptoms and signs involving cognitive function   Activity Tolerance  Other (comment) (Medical issues limiting therapy)   Patient Left in bed;with call bell/phone within reach;with nursing/sitter in room;with bed alarm set   Nurse Communication Mobility status        Time: 4098-1191 OT Time Calculation (min): 15 min  Charges: OT General Charges $OT Visit: 1 Visit OT Treatments $Therapeutic Activity: 8-22 mins  03/05/2024  AB, OTR/L  Acute Rehabilitation Services  Office: [phone]   Tristan Schroeder 03/05/2024, 12:37 PM

## 2024-03-05 NOTE — Progress Notes (Signed)
Patient off the unit for MRI.

## 2024-03-05 NOTE — Progress Notes (Signed)
 Patient while working with PT, found to have right facial droop, very lethargic, pill in his mouth, was not able to swallow, leaning on the right, BP in 190/107, MD informed, code stroke activated, stroke response nurse and MD from neurology (Dr. Roda Shutters) at bedside evaluating, patient taken to CT, will continue to monitor.

## 2024-03-05 NOTE — Progress Notes (Signed)
 Physical Therapy Treatment Patient Details Name: James [name]. MRN: [id number] DOB: [birth year] Today's Date: 03/05/2024   History of Present Illness 78 y/o male presents to [facility] on 3/14 with progressive weakness and confusion. Patient was recently admitted 2/8 until 2/10 with altered mental status, dizziness, unsteady gait. PMH includes: hypertension, hyperlipidemia, diabetes, GERD, splenic infarct, portal vein thrombosis, polycythemia vera, OSA, NASH cirrhosis, anemia, CML, anxiety    PT Comments  Patient resting in bed, appears lethargic and slight Rt facial droop and head tilted to lateral flexion to Rt. Pt with minimal verbal responses and noted to have pill in mouth. RN called to room. Max assist required to sit up EOB with +2 and pt attempted to swallow pill with water. Pt unable to keep water in mouth and pill pocketed on Rt side. Therapist removed pill and Total assist provided to return to supine. Pt not following cues/commands at all this date and RN notifying MD. Will continue to assess and progress as able.  BP 190's/100's, HR 70's, SpO2 94% on RA.     If plan is discharge home, recommend the following: Assistance with cooking/housework;Direct supervision/assist for medications management;Direct supervision/assist for financial management;Assist for transportation;Help with stairs or ramp for entrance;Supervision due to cognitive status;A lot of help with bathing/dressing/bathroom;A lot of help with walking and/or transfers   Can travel by private vehicle     No  Equipment Recommendations  None recommended by PT    Recommendations for Other Services       Precautions / Restrictions Precautions Precautions: Fall Restrictions Weight Bearing Restrictions Per Provider Order: No     Mobility  Bed Mobility Overal bed mobility: Needs Assistance Bed Mobility: Supine to Sit, Sit to Supine     Supine to sit: Max assist, +2 for safety/equipment, HOB elevated Sit to supine:  Total assist, +2 for safety/equipment, +2 for physical assistance   General bed mobility comments: pt demonstrating impaired processing and no initiation to move supine>sit. Pt required Max assist for LE's and to raise trunk to EOB. Max assist to maintain seated balance with heavy Rt lean and Rt facial droop. Total assist to return to supine and boost in bed. RN called to room and vital signs taken.    Transfers                        Ambulation/Gait                   Stairs             Wheelchair Mobility     Tilt Bed    Modified Rankin (Stroke Patients Only)       Balance Overall balance assessment: Needs assistance Sitting-balance support: Feet supported, Bilateral upper extremity supported Sitting balance-Leahy Scale: Poor Sitting balance - Comments: Rt lean and posterior lean, max assist Postural control: Posterior lean, Right lateral lean                                  Communication Communication Communication: Impaired Factors Affecting Communication: Reduced clarity of speech;Difficulty expressing self;Hearing impaired  Cognition Arousal: Lethargic Behavior During Therapy: Flat affect   PT - Cognitive impairments: History of cognitive impairments, Awareness, Orientation, Memory, Attention, Initiation, Sequencing, Problem solving, Safety/Judgement                       PT - Cognition Comments: pt  lethargic and noted to have pill in mouth while resting in bed on arrival. pt with minial verbal responses to cues. not following any commands today. Following commands: Impaired Following commands impaired:  (not following commands)    Cueing Cueing Techniques: Verbal cues, Gestural cues, Tactile cues  Exercises      General Comments General comments (skin integrity, edema, etc.): Pt with pill in mouth, water provided in sitting and virtually all water came back out oral cavity. pt still unable to swallow pill and  coughing some. Pill removed by therapist. RN present and aware.      Pertinent Vitals/Pain Pain Assessment Pain Assessment: PAINAD Breathing: normal Negative Vocalization: none Facial Expression: smiling or inexpressive Body Language: relaxed Consolability: no need to console PAINAD Score: 0    Home Living                          Prior Function            PT Goals (current goals can now be found in the care plan section) Acute Rehab PT Goals PT Goal Formulation: With patient Time For Goal Achievement: 03/15/24 Potential to Achieve Goals: Poor Progress towards PT goals: Progressing toward goals    Frequency    Min 2X/week      PT Plan      Co-evaluation              AM-PAC PT "6 Clicks" Mobility   Outcome Measure  Help needed turning from your back to your side while in a flat bed without using bedrails?: Total Help needed moving from lying on your back to sitting on the side of a flat bed without using bedrails?: Total Help needed moving to and from a bed to a chair (including a wheelchair)?: Total Help needed standing up from a chair using your arms (e.g., wheelchair or bedside chair)?: Total Help needed to walk in hospital room?: Total Help needed climbing 3-5 steps with a railing? : Total 6 Click Score: 6    End of Session Equipment Utilized During Treatment: Gait belt Activity Tolerance: Patient limited by lethargy;Other (comment) (change in cogntion and function) Patient left: in bed;with call bell/phone within reach;with bed alarm set;with nursing/sitter in room Nurse Communication: Mobility status;Other (comment) (change in cognition, facial droop on Rt) PT Visit Diagnosis: Unsteadiness on feet (R26.81);Repeated falls (R29.6);Muscle weakness (generalized) (M62.81);History of falling (Z91.81);Difficulty in walking, not elsewhere classified (R26.2)     Time: 4010-2725 PT Time Calculation (min) (ACUTE ONLY): 26 min  Charges:     $Therapeutic Activity: 8-22 mins PT General Charges $$ ACUTE PT VISIT: 1 Visit                     Wynn Maudlin, DPT Acute Rehabilitation Services Office [phone]  03/05/24 11:32 AM

## 2024-03-05 NOTE — Consult Note (Signed)
 Stroke Neurology Consultation Note  Consult Requested by: Dr. Isidoro Donning  Reason for Consult: code stroke  Consult Date: 03/05/24   The history was obtained from the RN.  During history and examination, all items were not able to obtain unless otherwise noted.  History of Present Illness:  James [name]. is a 78 y.o. Caucasian male with PMH of HTN, hyperlipidemia, diabetes, GERD, history of splenic infarct, portal vein thrombosis, polycythemia vera, OSA, NASH cirrhosis, anemia, CML, anxiety admitted on 3/14 for worsening weakness. Pt has worsening generalized weakness since 01/2024 after an admission for AMS and weakness. AMS improved but not weakness and actually getting worse over time and wife brought him in for management. Treated for AKI and cognitive impairment. Continued on Xarelto low dose for hx of splenic infarct and portal vein thrombosis. PT and OT recommend SNF. Per RN, today he was last seen well at 10am, but on PT visit at 11am, he was found to have right facial droop, lethargic and pill in his mouth. And PT felt patient whole body weakness but more leaning towards right side. Code stroke called. Stat CT no acute finding. On exam, pt awake alert but stated 78 years old, not orientated to time or place, able to repeat partial sentence and did not name as requested. Seems to have right facial droop but no significant arm or leg asymmetry but with asterixis BUEs.    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)    History   Portal vein thrombosis    Rhinitis    Situational depression    Sleep apnea    uses CPAP every night   Tubular adenoma 12/10/2015   6 cecum polyps    Past Surgical History:  Procedure Laterality Date   CARDIAC CATHETERIZATION     greater 10 yrs ago, normal   CARPAL TUNNEL RELEASE     bilateral   COLONOSCOPY  06/2005   [facility]   ESOPHAGOGASTRODUODENOSCOPY (EGD) WITH PROPOFOL N/A 09/28/2016   Procedure: ESOPHAGOGASTRODUODENOSCOPY (EGD) WITH PROPOFOL;  Surgeon: Iva Boop, MD;  Location: [facility];  Service: Endoscopy;  Laterality: N/A;   IR GENERIC HISTORICAL  12/12/2016   IR US GUIDE VASC ACCESS RIGHT 12/12/2016 Jolaine Click, MD [facility]   IR GENERIC HISTORICAL  12/12/2016   IR VENOGRAM HEPATIC W HEMODYNAMIC EVALUATION 12/12/2016 Jolaine Click, MD [facility]   IR GENERIC HISTORICAL  12/12/2016   IR TRANSCATHETER BX 12/12/2016 Jolaine Click, MD [facility]   REVERSE SHOULDER ARTHROPLASTY Right 09/01/2021   Procedure: REVERSE SHOULDER ARTHROPLASTY;  Surgeon: Francena Hanly, MD;  Location: [facility];  Service: Orthopedics;  Laterality: Right;  ,   ROTATOR CUFF REPAIR     right   TENDON  REPAIR     left arm   TONSILLECTOMY     TOTAL KNEE ARTHROPLASTY Right 2010   WISDOM TOOTH EXTRACTION      Family History  Problem Relation Age of Onset   Diabetes Mother        history   Stroke Mother        history   Lung disease Father        history   Bipolar disorder Son        committed suicide   Colon cancer Other    Heart failure Other    Heart attack Other    Colon polyps Neg Hx    Rectal cancer Neg Hx    Stomach cancer Neg Hx    Esophageal cancer Neg Hx    Sleep apnea Neg Hx     Social History:  reports that he quit smoking about 50 years ago. His smoking use included pipe. He has never used smokeless tobacco.  He reports current alcohol use. He reports that he does not use drugs.  Allergies:  Allergies  Allergen Reactions   Ibuprofen Anaphylaxis and Other (See Comments)    Upper GI Bled   Nsaids Other (See Comments)    GI Bleed    No current facility-administered medications on file prior to encounter.   Current Outpatient Medications on File Prior to Encounter  Medication Sig Dispense Refill   buPROPion (WELLBUTRIN XL) 300 MG 24 hr tablet Take 300 mg by mouth every morning.     desmopressin (DDAVP) 0.2 MG tablet Take 2 tablets by mouth at bedtime.     metFORMIN (GLUCOPHAGE-XR) 500 MG 24 hr tablet Take 500 mg by mouth 2 (two) times daily.     propranolol (INDERAL) 10 MG tablet Take 10 mg by mouth daily as needed (anxiety).     rosuvastatin (CRESTOR) 20 MG tablet Take 20 mg by mouth at bedtime.     testosterone cypionate (DEPOTESTOSTERONE CYPIONATE) 200 MG/ML injection Inject 200 mg into the muscle every 21 ( twenty-one) days.     vitamin B-12 (CYANOCOBALAMIN) 1000 MCG tablet Take 1,000 mcg by mouth daily.     XARELTO 10 MG TABS tablet TAKE 1 TABLET BY MOUTH EVERY DAY 30 tablet 11   dasatinib (SPRYCEL) 100 MG tablet Take 1 tablet (100 mg total) by mouth daily. (Patient not taking: Reported on 03/01/2024) 30 tablet 11   glucose blood test strip OneTouch Ultra Blue Test Strip  USE TO TEST BLOOD SUGAR ONCE DAILY      Review of Systems: A full ROS was attempted today and was not able to be performed due to AMS   Physical Examination: Temp:  [97.8 F (36.6 C)-99.3 F (37.4 C)] 99.3 F (37.4 C) (03/19 0818) Pulse Rate:  [65-85] 84 (03/19 0818) Resp:  [17-20] 18 (03/19 0818) BP: (121-192)/(68-107) 167/96 (03/19 1130) SpO2:  [93 %-98 %] 96 % (03/19 0818)  General - well nourished, well developed, in no apparent distress.    Ophthalmologic - fundi not visualized due to noncooperation.    Cardiovascular - regular rhythm and rate  Neuro - awake, alert, eyes open, not orientated. Minimal  language output but told me age "4", and able to repeat part of the simple sentence but not able to repeat whole sentence, did not name per request. Not quite following commands. No gaze palsy, tracking bilaterally to voice, blinking to visual threat bilaterally. R facial droop. Tongue protrusion not cooperative. BUEs drift to bed within 10 sec, with b/l  asterixis. BLEs able to hold knee flexion and foot on bed position less than 5 secs. Sensation, coordination not cooperative and gait not tested.  NIH Stroke Scale  Level Of Consciousness 0=Alert; keenly responsive 1=Arouse to minor stimulation 2=Requires repeated stimulation to arouse or movements to pain 3=postures or unresponsive 0  LOC Questions to Month and Age 39=Answers both questions correctly 1=Answers one question correctly or dysarthria/intubated/trauma/language barrier 2=Answers neither question correctly or aphasia 2  LOC Commands      -Open/Close eyes     -Open/close grip     -Pantomime commands if communication barrier 0=Performs both tasks correctly 1=Performs one task correctly 2=Performs neighter task correctly 2  Best Gaze     -Only assess horizontal gaze 0=Normal 1=Partial gaze palsy 2=Forced deviation, or total gaze paresis 0  Visual 0=No visual loss 1=Partial hemianopia 2=Complete hemianopia 3=Bilateral hemianopia (blind including cortical blindness) 0  Facial Palsy     -Use grimace if obtunded 0=Normal symmetrical movement 1=Minor paralysis (asymmetry) 2=Partial paralysis (lower face) 3=Complete paralysis (upper and lower face) 2  Motor  0=No drift for 10/5 seconds 1=Drift, but does not hit bed 2=Some antigravity effort, hits  bed 3=No effort against gravity, limb falls 4=No movement 0=Amputation/joint fusion Right Arm 2     Leg 2    Left Arm 2     Leg 2  Limb Ataxia     - FNT/HTS 0=Absent or does not understand or paralyzed or amputation/joint fusion 1=Present in one limb 2=Present in two limbs 0   Sensory 0=Normal 1=Mild to moderate sensory loss 2=Severe to total sensory loss or coma/unresponsive 1  Best Language 0=No aphasia, normal 1=Mild to moderate aphasia 2=Severe aphasia 3=Mute, global aphasia, or coma/unresponsive 2  Dysarthria 0=Normal 1=Mild to moderate 2=Severe, unintelligible or mute/anarthric 0=intubated/unable to test 2  Extinction/Neglect 0=No abnormality 1=visual/tactile/auditory/spatia/personal inattention/Extinction to bilateral simultaneous stimulation 2=Profound neglect/extinction more than 1 modality  1  Total   20     Data Reviewed: EEG adult Result Date: 03/03/2024 Charlsie Quest, MD     03/05/2024  8:28 AM Patient Name: Rise Paganini. MRN: [id number] Epilepsy Attending: Charlsie Quest Referring Physician/Provider: Cathren Harsh, MD Date: 03/03/2024 Duration: 22.05 mins Patient history: 78yo M with ams. EEG to evaluate for seizure Level of alertness: Awake AEDs during EEG study: None Technical aspects: This EEG study was done with scalp electrodes positioned according to the 10-20 International system of electrode placement. Electrical activity was reviewed with band pass filter of 1-70Hz , sensitivity of 7 uV/mm, display speed of 61mm/sec with a 60Hz  notched filter applied as appropriate. EEG data were recorded continuously and digitally stored.  Video monitoring was available and reviewed as appropriate. Description: The posterior dominant rhythm consists of 7.5 Hz activity of moderate voltage (25-35 uV) seen predominantly in posterior head regions, symmetric and reactive to eye opening and eye closing. EEG showed continuous generalized 6 to 7 Hz theta slowing. Hyperventilation and photic stimulation were not performed.   ABNORMALITY - Continuous slow, generalized IMPRESSION: This study is suggestive of mild diffuse encephalopathy. No seizures or epileptiform discharges were seen throughout the recording. Charlsie Quest   MR BRAIN WO CONTRAST Result  Date: 02/29/2024 CLINICAL DATA:  Transient ischemic attack.  Mental status changes. EXAM: MRI HEAD WITHOUT CONTRAST TECHNIQUE: Multiplanar, multiecho pulse sequences of the brain and surrounding structures were obtained without intravenous contrast. COMPARISON:  CT yesterday.  MRI 01/27/2024. FINDINGS: Brain: Diffusion imaging does not show any acute or subacute infarction or other cause  of restricted diffusion. No focal abnormality affects the brainstem. Few old small vessel cerebellar infarctions. Cerebral hemispheres show age related volume loss with moderate chronic small-vessel ischemic changes affecting the deep and subcortical white matter. No cortical or large vessel territory stroke. No mass, acute hemorrhage, hydrocephalus or extra-axial collection. Small focus of hemosiderin deposition in the left cerebellum. Vascular: Major vessels at the base of the brain show flow. Skull and upper cervical spine: Negative Sinuses/Orbits: Clear/normal Other: Bilateral mastoid effusions as present previously. IMPRESSION: 1. No acute finding by MRI. Age related volume loss. Moderate chronic small-vessel ischemic changes of the cerebral hemispheric white matter. Few old small vessel cerebellar infarctions. 2. Bilateral mastoid effusions as present previously. Electronically Signed   By: Paulina Fusi M.D.   On: 02/29/2024 13:06   CT Head Wo Contrast Result Date: 02/28/2024 CLINICAL DATA:  Weakness EXAM: CT HEAD WITHOUT CONTRAST TECHNIQUE: Contiguous axial images were obtained from the base of the skull through the vertex without intravenous contrast. RADIATION DOSE REDUCTION: This exam was performed according to the departmental dose-optimization program which includes automated exposure control, adjustment of the mA and/or kV according to patient size and/or use of iterative reconstruction technique. COMPARISON:  08/06/2024 FINDINGS: Brain: There is no mass, hemorrhage or extra-axial collection. There is generalized  atrophy without lobar predilection. Hypodensity of the white matter is most commonly associated with chronic microvascular disease. Vascular: Tortuous basilar artery, unchanged. Skull: The visualized skull base, calvarium and extracranial soft tissues are normal. Sinuses/Orbits: Small left mastoid effusion. Paranasal sinuses are clear. Normal orbits. Other: None. IMPRESSION: 1. No acute intracranial abnormality. 2. Generalized atrophy and findings of chronic microvascular disease. 3. Small left mastoid effusion. Electronically Signed   By: Deatra Robinson M.D.   On: 02/28/2024 19:12    Assessment: 78 y.o. male with PMH of HTN, hyperlipidemia, diabetes, GERD, history of splenic infarct, portal vein thrombosis, polycythemia vera, OSA, NASH cirrhosis, anemia, CML, anxiety admitted on 3/14 for worsening weakness. Last seen baseline at 10am, at 11am, he was found to have right facial droop, whole body weakness but more leaning towards right side. Code stroke called. Stat CT no acute finding. NIHSS = 20 but more of b/l weakness and not following commands. Pt not TNK candidate given on Xarelto. Not IR candidate given poor baseline with mRS = 4. Will do MRI and MRA to rule out stroke. He had Xarlto today at 10am. Will hold off Xarelto until MRI complete. Put on NPO and wait for speech for further swallow evaluation.     Stroke Risk Factors - diabetes mellitus, hyperlipidemia, hypertension, and polycythemia   Plan:  Frequent neuro checks Telemetry monitoring MRI brain  MRA head Pt had Xarelto today at 10am and will hold off further dose until MRI done  PT/OT/speech consult NPO now, pending swallow reeval Stroke risk factor modification Discussed with Dr. Isidoro Donning  We will follow   Thank you for this consultation and allowing [place] to participate in the care of this patient.  Marvel Plan, MD PhD Stroke Neurology 03/05/2024 12:13 PM

## 2024-03-05 NOTE — Code Documentation (Addendum)
 Stroke Response Nurse Documentation Code Documentation  James [name]. is a 78 y.o. male admitted to [facility]  on 02/29/24 for worsening weakness with history of HTN, HLD, DM, NASH cirrhosis, CML, anxiety, portal vein thrombosis. Patient takes Eliquis, last dose this AM 1006. LKW 1000, code stroke activated by 3W staff for right facial droop.  Stroke team at the bedside after patient activation. Patient taken to CT. NIH 19, see flowsheet for details. CT completed. No hemorrhage seen. No TNK d/t Elquis. No IR due to mRS. Care/Plan: See orders for NIH frequency. Patient remain NPO until swallow eval. Bedside handoff with RN Kennith Center.    Scarlette Slice K  Rapid Response RN

## 2024-03-05 NOTE — Progress Notes (Signed)
 [facility]                                                                              James [name], is a 78 y.o. male, DOB - 09-07-1946, WGN:[id number] Admit date - 02/29/2024    Outpatient Primary MD for the patient is [name], MD  LOS - 5  days  Chief Complaint  Patient presents with   Altered Mental Status   Weakness       Brief summary   Patient is a 78 year old male with HTN, hyperlipidemia, diabetes, GERD, history of splenic infarct, portal vein thrombosis, polycythemia vera, OSA, NASH cirrhosis, anemia, CML, anxiety presented with worsening weakness.  He was admitted 2/8-2/10 with altered mental status, dizziness, unsteady gait and had full neurology workup.  It was felt that his CML treatment Dasatinib was contributing to this and was discontinued.  Confusion has improved however he has had worsening generalized weakness, unsteady gait, needing help with transfers in the last 2 to 3 days.  Wife can no longer help care for him and patient is needing placement.  He was seen in the med [facility] ED on 3/13 and again presented to the hospital on 3/14. EDP also requested neurology to see the patient   Assessment & Plan    Principal Problem:   Gait abnormality Generalized debility -  Acute onset of gait abnormality with weakness and walking needing significant help with transfers.  Had some issues with his gait along with confusion a month ago which resolved.  Confusion has not returned but this weakness has been acute onset in the last 2 to 3 days. -CT head on 3/13 was negative for acute intracranial abnormality -MRI brain 3/14 showed no acute findings, age-related volume loss, chronic small vessel ischemic changes of the cerebral hemispheric white matter, old small vessel cerebellar infarctions.  Bilateral mastoid effusions. -CK normal, can resume statin -Vitamin D low, placed on replacement.  B12 normal, folate normal, has iron  deficiency -TSH normal 1.7, CRP 0.9, ESR 1. -Patient seen this morning, family at the bedside.  Did not need any Haldol overnight however was somewhat somnolent (Seroquel dose was increased yesterday evening) but easily arousable, wife and son were assisting with feeding him.  No facial drooping was noted although patient did not have good neurological exam due to somnolence. PT was consulted for evaluation. -During PT evaluation, patient was noted to be leaning towards the right and facial drooping, code stroke called.  Seen by neurology, Dr. Raynald Kemp, recommended MRI, MRA brain.  Hold off on Xarelto until MR imagings completed.  N.p.o., PT OT, SLP eval.  Not TNK candidate given on Xarelto. -Discussed with neurology, Dr. Roda Shutters, continue Seroquel but at earlier time, requested pharmacy to change timings to 8 PM.  Active problems Acute metabolic encephalopathy, with underlying cognitive dysfunction/vascular dementia -Ammonia level normal, no acute infectious process, likely has underlying dementia -Creatinine 1.31 with hemoglobin 17.0, placed on IV fluids -EKG repeated, QTc 446 -EEG showed mild diffuse encephalopathy, no seizures or epileptiform discharges -See #1  Acute kidney injury -Creatinine 1.6 on admission, was 1.5 on 3/14, 1.4 on 3/13 and  1.3 on 2/10 -Creatinine 1.8, hemoglobin 17.0, appears to be somewhat dehydrated, placed on IV fluids  Hypercalcemia, chronic -Calcium 11.5 on 01/28/2024 -Continue IV fluids -Corrected calcium today 11.4, Vitamin D 1.25OH low, PTH level, PTH rP pending -Will give 1 dose of Zometa, discussed with pharmacy.   Hypertension -BP elevated -Continue hydralazine IV as needed with parameters  Hyperlipidemia -CK normal, resumed statin   Diabetes -Continue sliding scale insulin while inpatient CBG (last 3)  Recent Labs    03/04/24 2141 03/05/24 0625 03/05/24 1122  GLUCAP 187* 146* 163*  -Continue moderate SSI  Chronic nocturia -Continue DDAVP at night,  follow sodium level   History of splenic infarct - Continue home Xarelto   Polycythemia vera, CML  Followed by oncology. Dr Myna Hidalgo outpatient.  Was on Sprycel with good response and at last eval in February was considered to be in remission.  Sprycel was discontinued.    NASH cirrhosis LFTs stable   OSA - Continue home CPAP   Estimated body mass index is 25.5 kg/m as calculated from the following:   Height as of this encounter: 5\' 6"  (1.676 m).   Weight as of this encounter: 71.7 kg.  Code Status: DNR DVT Prophylaxis:  rivaroxaban (XARELTO) tablet 10 mg Start: 03/01/24 1000 rivaroxaban (XARELTO) tablet 10 mg   Level of Care: Level of care: Telemetry Medical Family Communication: Updated patient's wife and son at the bedside at the bedside today Disposition Plan:      Remains inpatient appropriate: PT OT evaluation recommended SNF   Procedures:  None  Consultants:   Neurology   Antimicrobials:   Anti-infectives (From admission, onward)    None          Medications  [START ON 03/06/2024]  stroke: early stages of recovery book   Does not apply Once   buPROPion  300 mg Oral Daily   desmopressin  400 mcg Oral QHS   insulin aspart  0-15 Units Subcutaneous TID WC   insulin aspart  0-5 Units Subcutaneous QHS   propranolol  10 mg Oral Daily   QUEtiapine  25 mg Oral QHS   rivaroxaban  10 mg Oral Daily   rosuvastatin  20 mg Oral QHS   sodium chloride flush  3 mL Intravenous Q12H   zoledronic acid (ZOMETA) IVPB  4 mg Intravenous Once      Subjective:   James [name] was seen and examined today.  Seen this morning, patient was somewhat somnolent but easily arousable, no facial drooping was noted.  Wife and son at the bedside.  Denies any specific complaints.  Objective:   Vitals:   03/05/24 0818 03/05/24 1115 03/05/24 1130 03/05/24 1216  BP: (!) 160/98 (!) 192/107 (!) 167/96 (!) 189/101  Pulse: 84   68  Resp: 18   16  Temp: 99.3 F (37.4 C)   97.7 F (36.5  C)  TempSrc: Oral   Oral  SpO2: 96%   96%  Weight:      Height:        Intake/Output Summary (Last 24 hours) at 03/05/2024 1235 Last data filed at 03/05/2024 0350 Gross per 24 hour  Intake 820.27 ml  Output 675 ml  Net 145.27 ml     Wt Readings from Last 3 Encounters:  02/29/24 71.7 kg  02/28/24 71.7 kg  01/28/24 77.2 kg   Physical Exam General: Somnolent but arousable, oriented to self and person Cardiovascular: S1 S2 clear, RRR.  Respiratory: CTAB Gastrointestinal: Soft, nontender, nondistended, NBS Ext: no  pedal edema bilaterally Neuro: difficult to assess, intermittently follows commands, moving all 4 extremities spontaneously, no facial droop  Psych: somnolent  Data Reviewed:  I have personally reviewed following labs    CBC Lab Results  Component Value Date   WBC 9.2 03/05/2024   RBC 4.94 03/05/2024   HGB 14.8 03/05/2024   HCT 45.5 03/05/2024   MCV 92.1 03/05/2024   MCH 30.0 03/05/2024   PLT 208 03/05/2024   MCHC 32.5 03/05/2024   RDW 14.6 03/05/2024   LYMPHSABS 0.9 01/26/2024   MONOABS 0.9 01/26/2024   EOSABS 0.2 01/26/2024   BASOSABS 0.0 01/26/2024     Last metabolic panel Lab Results  Component Value Date   NA 137 03/05/2024   K 4.0 03/05/2024   CL 112 (H) 03/05/2024   CO2 22 03/05/2024   BUN 25 (H) 03/05/2024   CREATININE 1.55 (H) 03/05/2024   GLUCOSE 151 (H) 03/05/2024   GFRNONAA 46 (L) 03/05/2024   GFRAA 53 (L) 09/02/2020   CALCIUM 10.6 (H) 03/05/2024   PHOS 2.6 03/04/2024   PROT 5.7 (L) 03/05/2024   ALBUMIN 3.0 (L) 03/05/2024   LABGLOB 2.4 10/25/2016   AGRATIO 1.7 10/25/2016   BILITOT 0.8 03/05/2024   ALKPHOS 67 03/05/2024   AST 14 (L) 03/05/2024   ALT 13 03/05/2024   ANIONGAP 3 (L) 03/05/2024    CBG (last 3)  Recent Labs    03/04/24 2141 03/05/24 0625 03/05/24 1122  GLUCAP 187* 146* 163*      Coagulation Profile: No results for input(s): "INR", "PROTIME" in the last 168 hours.   Radiology Studies: I have  personally reviewed the imaging studies  CT HEAD CODE STROKE WO CONTRAST Result Date: 03/05/2024 CLINICAL DATA:  Code stroke. Neuro deficit, acute, stroke suspected. EXAM: CT HEAD WITHOUT CONTRAST TECHNIQUE: Contiguous axial images were obtained from the base of the skull through the vertex without intravenous contrast. RADIATION DOSE REDUCTION: This exam was performed according to the departmental dose-optimization program which includes automated exposure control, adjustment of the mA and/or kV according to patient size and/or use of iterative reconstruction technique. COMPARISON:  Head CT 02/28/2024 and MRI 02/29/2024 FINDINGS: The study is mildly to moderately motion degraded. Brain: Within limitation of motion, no acute infarct, intracranial hemorrhage, mass, midline shift, or extra-axial fluid collection is identified. Patchy hypodensities in the cerebral white matter bilaterally are similar to the recent prior CT and are nonspecific but compatible with moderate chronic small vessel ischemic disease. There is mild cerebral atrophy. Vascular: Calcified atherosclerosis at the skull base. No hyperdense vessel. Skull: No acute fracture or suspicious lesion. Sinuses/Orbits: Clear paranasal sinuses. Persistent small to moderate bilateral mastoid effusions. Bilateral cataract extraction. Other: None. ASPECTS (Alberta Stroke Program Early CT Score) - Ganglionic level infarction (caudate, lentiform nuclei, internal capsule, insula, M1-M3 cortex): 7 - Supraganglionic infarction (M4-M6 cortex): 3 Total score (0-10 with 10 being normal): 10 These results were communicated to Dr. Roda Shutters at 11:56 am on 03/05/2024 by text page via the [facility] messaging system. IMPRESSION: 1. Motion degraded exam. No evidence of acute intracranial abnormality. ASPECTS of 10. 2. Moderate chronic small vessel ischemic disease. Electronically Signed   By: Sebastian Ache M.D.   On: 03/05/2024 11:57   EEG adult Result Date: 03/03/2024 Charlsie Quest, MD     03/05/2024  8:28 AM Patient Name: James [name]. MRN: [id number] Epilepsy Attending: Charlsie Quest Referring Physician/Provider: Cathren Harsh, MD Date: 03/03/2024 Duration: 22.05 mins Patient history: 78yo M with ams. EEG  to evaluate for seizure Level of alertness: Awake AEDs during EEG study: None Technical aspects: This EEG study was done with scalp electrodes positioned according to the 10-20 International system of electrode placement. Electrical activity was reviewed with band pass filter of 1-70Hz , sensitivity of 7 uV/mm, display speed of 37mm/sec with a 60Hz  notched filter applied as appropriate. EEG data were recorded continuously and digitally stored.  Video monitoring was available and reviewed as appropriate. Description: The posterior dominant rhythm consists of 7.5 Hz activity of moderate voltage (25-35 uV) seen predominantly in posterior head regions, symmetric and reactive to eye opening and eye closing. EEG showed continuous generalized 6 to 7 Hz theta slowing. Hyperventilation and photic stimulation were not performed.   ABNORMALITY - Continuous slow, generalized IMPRESSION: This study is suggestive of mild diffuse encephalopathy. No seizures or epileptiform discharges were seen throughout the recording. Priyanka Renee Pain M.D. [facility] 03/05/2024, 12:35 PM  Available via Epic secure chat 7am-7pm After 7 pm, please refer to night coverage provider listed on amion.

## 2024-03-05 NOTE — Plan of Care (Signed)
  Problem: Clinical Measurements: Goal: Respiratory complications will improve Outcome: Progressing Goal: Cardiovascular complication will be avoided Outcome: Progressing   Problem: Elimination: Goal: Will not experience complications related to urinary retention Outcome: Progressing   Problem: Pain Managment: Goal: General experience of comfort will improve and/or be controlled Outcome: Progressing   Problem: Skin Integrity: Goal: Risk for impaired skin integrity will decrease Outcome: Progressing   Problem: Skin Integrity: Goal: Risk for impaired skin integrity will decrease Outcome: Progressing

## 2024-03-05 NOTE — Progress Notes (Signed)
Patient back to the unit.

## 2024-03-06 ENCOUNTER — Inpatient Hospital Stay (HOSPITAL_COMMUNITY)

## 2024-03-06 DIAGNOSIS — C9210 Chronic myeloid leukemia, BCR/ABL-positive, not having achieved remission: Secondary | ICD-10-CM | POA: Diagnosis not present

## 2024-03-06 DIAGNOSIS — R531 Weakness: Secondary | ICD-10-CM | POA: Diagnosis not present

## 2024-03-06 DIAGNOSIS — G9341 Metabolic encephalopathy: Secondary | ICD-10-CM

## 2024-03-06 DIAGNOSIS — F419 Anxiety disorder, unspecified: Secondary | ICD-10-CM | POA: Diagnosis not present

## 2024-03-06 DIAGNOSIS — N179 Acute kidney failure, unspecified: Secondary | ICD-10-CM | POA: Diagnosis not present

## 2024-03-06 DIAGNOSIS — R269 Unspecified abnormalities of gait and mobility: Secondary | ICD-10-CM | POA: Diagnosis not present

## 2024-03-06 LAB — COMPREHENSIVE METABOLIC PANEL
ALT: 11 U/L (ref 0–44)
AST: 15 U/L (ref 15–41)
Albumin: 2.9 g/dL — ABNORMAL LOW (ref 3.5–5.0)
Alkaline Phosphatase: 65 U/L (ref 38–126)
Anion gap: 8 (ref 5–15)
BUN: 23 mg/dL (ref 8–23)
CO2: 23 mmol/L (ref 22–32)
Calcium: 10.8 mg/dL — ABNORMAL HIGH (ref 8.9–10.3)
Chloride: 109 mmol/L (ref 98–111)
Creatinine, Ser: 1.54 mg/dL — ABNORMAL HIGH (ref 0.61–1.24)
GFR, Estimated: 46 mL/min — ABNORMAL LOW (ref >60)
Glucose, Bld: 154 mg/dL — ABNORMAL HIGH (ref 70–99)
Potassium: 4.2 mmol/L (ref 3.5–5.1)
Sodium: 140 mmol/L (ref 135–145)
Total Bilirubin: 0.8 mg/dL (ref 0.0–1.2)
Total Protein: 6.1 g/dL — ABNORMAL LOW (ref 6.5–8.1)

## 2024-03-06 LAB — GLUCOSE, CAPILLARY
Glucose-Capillary: 143 mg/dL — ABNORMAL HIGH (ref 70–99)
Glucose-Capillary: 125 mg/dL — ABNORMAL HIGH (ref 70–99)
Glucose-Capillary: 146 mg/dL — ABNORMAL HIGH (ref 70–99)
Glucose-Capillary: 183 mg/dL — ABNORMAL HIGH (ref 70–99)

## 2024-03-06 LAB — CBC
HCT: 47.8 % (ref 39.0–52.0)
Hemoglobin: 15.1 g/dL (ref 13.0–17.0)
MCH: 29.7 pg (ref 26.0–34.0)
MCHC: 31.6 g/dL (ref 30.0–36.0)
MCV: 93.9 fL (ref 80.0–100.0)
Platelets: 220 10*3/uL (ref 150–400)
RBC: 5.09 MIL/uL (ref 4.22–5.81)
RDW: 14.7 % (ref 11.5–15.5)
WBC: 11.4 10*3/uL — ABNORMAL HIGH (ref 4.0–10.5)
nRBC: 0 % (ref 0.0–0.2)

## 2024-03-06 LAB — PARATHYROID HORMONE, INTACT (NO CA): PTH: 41 pg/mL (ref 15–65)

## 2024-03-06 MED ORDER — SODIUM CHLORIDE 0.9 % IV SOLN: INTRAVENOUS | Status: DC

## 2024-03-06 MED ORDER — BISACODYL 10 MG RE SUPP
10.0000 mg | Freq: Every day | RECTAL | Status: DC | PRN
  Administered 2024-03-06: 10 mg via RECTAL
  Filled 2024-03-06: qty 1

## 2024-03-06 NOTE — Progress Notes (Signed)
 [facility]                                                                              James [name], is a 78 y.o. male, DOB - 1946-06-29, ZOX:[id number] Admit date - 02/29/2024    Outpatient Primary MD for the patient is [name], MD  LOS - 6  days  Chief Complaint  Patient presents with   Altered Mental Status   Weakness       Brief summary   Patient is a 77 year old male with HTN, hyperlipidemia, diabetes, GERD, history of splenic infarct, portal vein thrombosis, polycythemia vera, OSA, NASH cirrhosis, anemia, CML, anxiety presented with worsening weakness.  He was admitted 2/8-2/10 with altered mental status, dizziness, unsteady gait and had full neurology workup.  It was felt that his CML treatment Dasatinib was contributing to this and was discontinued.  Confusion has improved however he has had worsening generalized weakness, unsteady gait, needing help with transfers in the last 2 to 3 days.  Wife can no longer help care for him and patient is needing placement.  He was seen in the [facility] ED on 3/13 and again presented to the hospital on 3/14. EDP also requested neurology to see the patient   Assessment & Plan    Principal Problem:   Gait abnormality, Generalized debility --CT head on 3/13: negative for acute intracranial abnormality -MRI brain 3/14 showed no acute findings, age-related volume loss, chronic small vessel ischemic changes of the cerebral hemispheric white matter, old small vessel cerebellar infarctions.  Bilateral mastoid effusions. -CK normal. Vitamin D low, placed on replacement.   - B12 normal, folate normal, has iron deficiency -TSH normal 1.7, CRP 0.9, ESR 1. -On 3/19, during PT, patient was noted to be leaning towards the right with facial drooping, code stroke called.  Seen by neurology -MRI brain with no acute intracranial abnormality.  Age-related cerebral atrophy with moderate chronic microvascular ischemic  disease.  - MRA negative for large vessel occlusion, no visible hemodynamically significant stenosis -N.p.o. for SLP eval.  PT OT eval.  -Discussed with Dr. Roda Shutters, continue Seroquel but at earlier time, changed timings to 8 PM. -Continue Xarelto  Active problems Acute metabolic encephalopathy, with underlying cognitive dysfunction/vascular dementia -Ammonia level normal, no acute infectious process, likely has underlying dementia -Creatinine 1.31 with hemoglobin 17.0, placed on IV fluids -EKG repeated, QTc 446 -EEG showed mild diffuse encephalopathy, no seizures or epileptiform discharges -See #1 -Will consult palliative medicine  Acute kidney injury -Creatinine 1.6 on admission, was 1.5 on 3/14, 1.4 on 3/13 and 1.3 on 2/10 -Creatinine 1.5, on gentle hydration  Hypercalcemia, chronic -Calcium 11.5 on 01/28/2024 - Vitamin D 1.25OH low, PTH level, PTH rP pending, received Zometa IV x 1 on 3/19 -Corrected calcium 11.7   Hypertension -BP elevated, continue propranolol -Continue hydralazine IV as needed with parameters  Hyperlipidemia -CK normal, resumed statin   Diabetes -Continue sliding scale insulin while inpatient CBG (last 3)  Recent Labs    03/05/24 2120 03/06/24 0644 03/06/24 1207  GLUCAP 157* 143* 125*  -Continue moderate SSI  Chronic nocturia -Continue DDAVP at night, follow  sodium level   History of splenic infarct - Continue home Xarelto   Polycythemia vera, CML  Followed by oncology. Dr Myna Hidalgo outpatient.  Was on Sprycel with good response and at last eval in February was considered to be in remission.  Sprycel was discontinued.    NASH cirrhosis LFTs stable   OSA - Continue home CPAP   Estimated body mass index is 25.5 kg/m as calculated from the following:   Height as of this encounter: 5\' 6"  (1.676 m).   Weight as of this encounter: 71.7 kg.  Code Status: DNR DVT Prophylaxis:  rivaroxaban (XARELTO) tablet 10 mg Start: 03/01/24 1000 rivaroxaban  (XARELTO) tablet 10 mg   Level of Care: Level of care: Telemetry Medical Family Communication: Updated patient's wife and son at the bedside at the bedside on 3/19 Disposition Plan:      Remains inpatient appropriate: PT OT evaluation recommended SNF   Procedures:  None  Consultants:   Neurology   Antimicrobials:   Anti-infectives (From admission, onward)    None          Medications  buPROPion  300 mg Oral Daily   desmopressin  400 mcg Oral QHS   insulin aspart  0-15 Units Subcutaneous TID WC   insulin aspart  0-5 Units Subcutaneous QHS   propranolol  10 mg Oral Daily   QUEtiapine  25 mg Oral QHS   rivaroxaban  10 mg Oral Daily   rosuvastatin  20 mg Oral QHS   sodium chloride flush  3 mL Intravenous Q12H      Subjective:   James [name] was seen and examined today.  Still very somnolent, moving all 4 extremities spontaneously but not following commands.   Objective:   Vitals:   03/06/24 0003 03/06/24 0402 03/06/24 0744 03/06/24 1207  BP: (!) 150/86 (!) 173/102 (!) 151/86 (!) 158/89  Pulse: 86 72 76 77  Resp: 18 18 18 18   Temp: 98.4 F (36.9 C) 98.1 F (36.7 C) 98.2 F (36.8 C) 98.6 F (37 C)  TempSrc: Oral Oral Oral Oral  SpO2: 98% 94% 96% 93%  Weight:      Height:        Intake/Output Summary (Last 24 hours) at 03/06/2024 1332 Last data filed at 03/06/2024 1216 Gross per 24 hour  Intake 421.87 ml  Output 2900 ml  Net -2478.13 ml     Wt Readings from Last 3 Encounters:  02/29/24 71.7 kg  02/28/24 71.7 kg  01/28/24 77.2 kg   Physical Exam General: Somnolent but arousable, difficult to obtain ROS Cardiovascular: S1 S2 clear, RRR.  Respiratory: CTAB Gastrointestinal: Soft, nontender, nondistended, NBS Ext: no pedal edema bilaterally Neuro: difficult to assess, somnolent Skin: No rashes Psych: lethargic and somnolent  Data Reviewed:  I have personally reviewed following labs    CBC Lab Results  Component Value Date   WBC 11.4 (H)  03/06/2024   RBC 5.09 03/06/2024   HGB 15.1 03/06/2024   HCT 47.8 03/06/2024   MCV 93.9 03/06/2024   MCH 29.7 03/06/2024   PLT 220 03/06/2024   MCHC 31.6 03/06/2024   RDW 14.7 03/06/2024   LYMPHSABS 0.9 01/26/2024   MONOABS 0.9 01/26/2024   EOSABS 0.2 01/26/2024   BASOSABS 0.0 01/26/2024     Last metabolic panel Lab Results  Component Value Date   NA 140 03/06/2024   K 4.2 03/06/2024   CL 109 03/06/2024   CO2 23 03/06/2024   BUN 23 03/06/2024   CREATININE 1.54 (H)  03/06/2024   GLUCOSE 154 (H) 03/06/2024   GFRNONAA 46 (L) 03/06/2024   GFRAA 53 (L) 09/02/2020   CALCIUM 10.8 (H) 03/06/2024   PHOS 2.6 03/04/2024   PROT 6.1 (L) 03/06/2024   ALBUMIN 2.9 (L) 03/06/2024   LABGLOB 2.4 10/25/2016   AGRATIO 1.7 10/25/2016   BILITOT 0.8 03/06/2024   ALKPHOS 65 03/06/2024   AST 15 03/06/2024   ALT 11 03/06/2024   ANIONGAP 8 03/06/2024    CBG (last 3)  Recent Labs    03/05/24 2120 03/06/24 0644 03/06/24 1207  GLUCAP 157* 143* 125*      Coagulation Profile: No results for input(s): "INR", "PROTIME" in the last 168 hours.   Radiology Studies: I have personally reviewed the imaging studies  MR BRAIN WO CONTRAST Result Date: 03/05/2024 CLINICAL DATA:  Follow-up examination for stroke, altered mental status. EXAM: MRI HEAD WITHOUT CONTRAST MRA HEAD WITHOUT CONTRAST TECHNIQUE: Multiplanar, multi-echo pulse sequences of the brain and surrounding structures were acquired without intravenous contrast. Angiographic images of the Circle of Willis were acquired using MRA technique without intravenous contrast. COMPARISON:  CT from earlier the same day as well as recent MRI from 02/29/2024 FINDINGS: MRI HEAD FINDINGS Brain: Examination moderately degraded by motion artifact. Mild age-related cerebral atrophy with moderate chronic microvascular ischemic disease. No evidence for acute or subacute infarct. No acute intracranial hemorrhage. Single chronic microhemorrhage noted at the left  cerebellum, stable. No mass lesion, midline shift or mass effect. No hydrocephalus or extra-axial fluid collection. Pituitary gland grossly within normal limits. Vascular: Major intracranial vascular flow voids are grossly maintained at the skull base. Skull and upper cervical spine: Craniocervical junction within normal limits. Bone marrow signal intensity or grossly normal. No scalp soft tissue abnormality. Sinuses/Orbits: Prior bilateral ocular lens replacement. Paranasal sinuses are largely clear. Small to moderate bilateral mastoid effusions, similar to prior, and likely benign/sterile. Image nasopharynx unremarkable. Other: None. MRA HEAD FINDINGS Anterior circulation: Examination is severely degraded by motion artifact. Intracranial arterial circulation is mildly dolichoectatic in appearance. Both ICAs remain grossly patent to the siphons without visible stenosis or other abnormality. A1 segments grossly patent. Anterior communicating artery complex not well assessed due to motion. Partially visualized ACAs are grossly patent to their distal aspects. No visible M1 stenosis or occlusion. Distal MCA branches grossly perfused and symmetric. Evaluation for possible stenoses fairly limited on this motion degraded exam. Posterior circulation: Both V4 segments remain grossly patent to the vertebrobasilar junction. Right vertebral artery dominant. Neither PICA well visualized on this motion degraded exam. Basilar patent without visible stenosis. Neither superior cerebral artery well visualized. Both PCAs primarily supplied via the basilar grossly patent at their origins, not well assessed distally due to motion. Anatomic variants: None significant. IMPRESSION: MRI HEAD: 1. Motion degraded exam. 2. No acute intracranial abnormality. 3. Age-related cerebral atrophy with moderate chronic microvascular ischemic disease. MRA HEAD: 1. Severely motion degraded exam. 2. Grossly stable and negative intracranial MRA for large  vessel occlusion. No visible hemodynamically significant or correctable stenosis. Electronically Signed   By: Rise Mu M.D.   On: 03/05/2024 17:49   MR ANGIO HEAD WO CONTRAST Result Date: 03/05/2024 CLINICAL DATA:  Follow-up examination for stroke, altered mental status. EXAM: MRI HEAD WITHOUT CONTRAST MRA HEAD WITHOUT CONTRAST TECHNIQUE: Multiplanar, multi-echo pulse sequences of the brain and surrounding structures were acquired without intravenous contrast. Angiographic images of the Circle of Willis were acquired using MRA technique without intravenous contrast. COMPARISON:  CT from earlier the same day as well  as recent MRI from 02/29/2024 FINDINGS: MRI HEAD FINDINGS Brain: Examination moderately degraded by motion artifact. Mild age-related cerebral atrophy with moderate chronic microvascular ischemic disease. No evidence for acute or subacute infarct. No acute intracranial hemorrhage. Single chronic microhemorrhage noted at the left cerebellum, stable. No mass lesion, midline shift or mass effect. No hydrocephalus or extra-axial fluid collection. Pituitary gland grossly within normal limits. Vascular: Major intracranial vascular flow voids are grossly maintained at the skull base. Skull and upper cervical spine: Craniocervical junction within normal limits. Bone marrow signal intensity or grossly normal. No scalp soft tissue abnormality. Sinuses/Orbits: Prior bilateral ocular lens replacement. Paranasal sinuses are largely clear. Small to moderate bilateral mastoid effusions, similar to prior, and likely benign/sterile. Image nasopharynx unremarkable. Other: None. MRA HEAD FINDINGS Anterior circulation: Examination is severely degraded by motion artifact. Intracranial arterial circulation is mildly dolichoectatic in appearance. Both ICAs remain grossly patent to the siphons without visible stenosis or other abnormality. A1 segments grossly patent. Anterior communicating artery complex not well  assessed due to motion. Partially visualized ACAs are grossly patent to their distal aspects. No visible M1 stenosis or occlusion. Distal MCA branches grossly perfused and symmetric. Evaluation for possible stenoses fairly limited on this motion degraded exam. Posterior circulation: Both V4 segments remain grossly patent to the vertebrobasilar junction. Right vertebral artery dominant. Neither PICA well visualized on this motion degraded exam. Basilar patent without visible stenosis. Neither superior cerebral artery well visualized. Both PCAs primarily supplied via the basilar grossly patent at their origins, not well assessed distally due to motion. Anatomic variants: None significant. IMPRESSION: MRI HEAD: 1. Motion degraded exam. 2. No acute intracranial abnormality. 3. Age-related cerebral atrophy with moderate chronic microvascular ischemic disease. MRA HEAD: 1. Severely motion degraded exam. 2. Grossly stable and negative intracranial MRA for large vessel occlusion. No visible hemodynamically significant or correctable stenosis. Electronically Signed   By: Rise Mu M.D.   On: 03/05/2024 17:49   DG CHEST PORT 1 VIEW Result Date: 03/05/2024 CLINICAL DATA:  Cough. EXAM: PORTABLE CHEST 1 VIEW COMPARISON:  December 25, 2023. FINDINGS: The heart size and mediastinal contours are within normal limits. Both lungs are clear. Status post right shoulder arthroplasty. IMPRESSION: No active disease. Electronically Signed   By: Lupita Raider M.D.   On: 03/05/2024 15:59   CT HEAD CODE STROKE WO CONTRAST Result Date: 03/05/2024 CLINICAL DATA:  Code stroke. Neuro deficit, acute, stroke suspected. EXAM: CT HEAD WITHOUT CONTRAST TECHNIQUE: Contiguous axial images were obtained from the base of the skull through the vertex without intravenous contrast. RADIATION DOSE REDUCTION: This exam was performed according to the departmental dose-optimization program which includes automated exposure control, adjustment of  the mA and/or kV according to patient size and/or use of iterative reconstruction technique. COMPARISON:  Head CT 02/28/2024 and MRI 02/29/2024 FINDINGS: The study is mildly to moderately motion degraded. Brain: Within limitation of motion, no acute infarct, intracranial hemorrhage, mass, midline shift, or extra-axial fluid collection is identified. Patchy hypodensities in the cerebral white matter bilaterally are similar to the recent prior CT and are nonspecific but compatible with moderate chronic small vessel ischemic disease. There is mild cerebral atrophy. Vascular: Calcified atherosclerosis at the skull base. No hyperdense vessel. Skull: No acute fracture or suspicious lesion. Sinuses/Orbits: Clear paranasal sinuses. Persistent small to moderate bilateral mastoid effusions. Bilateral cataract extraction. Other: None. ASPECTS [facility] Stroke Program Early CT Score) - Ganglionic level infarction (caudate, lentiform nuclei, internal capsule, insula, M1-M3 cortex): 7 - Supraganglionic infarction (M4-M6 cortex):  3 Total score (0-10 with 10 being normal): 10 These results were communicated to Dr. Roda Shutters at 11:56 am on 03/05/2024 by text page via the [facility] messaging system. IMPRESSION: 1. Motion degraded exam. No evidence of acute intracranial abnormality. ASPECTS of 10. 2. Moderate chronic small vessel ischemic disease. Electronically Signed   By: Sebastian Ache M.D.   On: 03/05/2024 11:57        Notnamed Croucher M.D. [facility] 03/06/2024, 1:32 PM  Available via Epic secure chat 7am-7pm After 7 pm, please refer to night coverage provider listed on amion.

## 2024-03-06 NOTE — Progress Notes (Signed)
 STROKE TEAM PROGRESS NOTE   SUBJECTIVE (INTERVAL HISTORY) His wife and son are at the bedside.  Pt drowsy sleepy and not open eyes on voice, still has right mild nasolabial fold flattening but more likely due to he neck kept in the right turning position, he would grimace when attempted to push his neck to the left. Seems moving all extremities. Still NPO, on IVF.    OBJECTIVE Temp:  [98.1 F (36.7 C)-98.8 F (37.1 C)] 98.7 F (37.1 C) (03/20 1611) Pulse Rate:  [54-86] 84 (03/20 1611) Cardiac Rhythm: Normal sinus rhythm (03/20 0700) Resp:  [16-18] 16 (03/20 1611) BP: (146-174)/(73-110) 146/73 (03/20 1635) SpO2:  [91 %-98 %] 97 % (03/20 1611)  Recent Labs  Lab 03/05/24 1710 03/05/24 2120 03/06/24 0644 03/06/24 1207 03/06/24 1610  GLUCAP 113* 157* 143* 125* 146*   Recent Labs  Lab 03/02/24 0647 03/03/24 0528 03/04/24 0511 03/05/24 0523 03/06/24 0710  NA 139 135 135 137 140  K 4.0 3.8 4.6 4.0 4.2  CL 110 107 107 112* 109  CO2 23 22 20* 22 23  GLUCOSE 133* 133* 166* 151* 154*  BUN 23 20 20  25* 23  CREATININE 1.46* 1.28* 1.31* 1.55* 1.54*  CALCIUM 11.0* 10.9* 11.1* 10.6* 10.8*  PHOS 2.8 2.8 2.6  --   --    Recent Labs  Lab 02/29/24 1638 03/01/24 0601 03/02/24 0647 03/03/24 0528 03/04/24 0511 03/04/24 1345 03/05/24 0523 03/06/24 0710  AST 19 20  --   --   --  16 14* 15  ALT 15 11  --   --   --  15 13 11   ALKPHOS 75 73  --   --   --  71 67 65  BILITOT 0.7 0.8  --   --   --  0.9 0.8 0.8  PROT 6.8 6.4*  --   --   --  6.2* 5.7* 6.1*  ALBUMIN 3.8 3.8   < > 3.2* 3.4* 3.1* 3.0* 2.9*   < > = values in this interval not displayed.   Recent Labs  Lab 03/02/24 0647 03/03/24 0528 03/04/24 0511 03/05/24 0523 03/06/24 0710  WBC 6.1 8.0 9.5 9.2 11.4*  HGB 15.8 15.4 17.0 14.8 15.1  HCT 49.0 47.4 52.3* 45.5 47.8  MCV 90.9 91.7 91.6 92.1 93.9  PLT 225 226 213 208 220   Recent Labs  Lab 03/02/24 0647  CKTOTAL 51   No results for input(s): "LABPROT", "INR" in the  last 72 hours. No results for input(s): "COLORURINE", "LABSPEC", "PHURINE", "GLUCOSEU", "HGBUR", "BILIRUBINUR", "KETONESUR", "PROTEINUR", "UROBILINOGEN", "NITRITE", "LEUKOCYTESUR" in the last 72 hours.  Invalid input(s): "APPERANCEUR"     Component Value Date/Time   CHOL 78 01/28/2024 0421   TRIG 74 01/28/2024 0421   HDL 32 (L) 01/28/2024 0421   CHOLHDL 2.4 01/28/2024 0421   VLDL 15 01/28/2024 0421   LDLCALC 31 01/28/2024 0421   Lab Results  Component Value Date   HGBA1C 6.6 (H) 01/27/2024      Component Value Date/Time   LABOPIA NONE DETECTED 09/02/2021 0310   COCAINSCRNUR NONE DETECTED 09/02/2021 0310   LABBENZ NONE DETECTED 09/02/2021 0310   AMPHETMU NONE DETECTED 09/02/2021 0310   THCU NONE DETECTED 09/02/2021 0310   LABBARB NONE DETECTED 09/02/2021 0310    No results for input(s): "ETH" in the last 168 hours.  I have personally reviewed the radiological images below and agree with the radiology interpretations.  DG Swallowing Func-Speech Pathology Result Date: 03/06/2024 Table formatting from the original  result was not included. Modified Barium Swallow Study Patient Details Name: James [name]. MRN: [id number] Date of Birth: 1946/06/26 Today's Date: 03/06/2024 HPI/PMH: HPI: Patient is a 78 y.o. male with PMH: HTN, HLD, DM, GERD, splenic infarct, portal vein thrombosis, polycythemia, OSA, NASH cirrhosis, anemia, anxiety. He presented to the hospital on 02/29/24 with AMS, worsening weakness, dizziness, unsteady gait. It has been increasingly difficult for his spouse to care for him. Patient has been exhibiting increasing agitation directed at spouse at nighttime. He was admitted with gait abnormality, generalized debility, acute metabolic encephalopathy with underlying cognitive dysfunction/vascular dementia. MRI brain negative for acute finding but did show moderate chronic small-vessel ischemic changes of the cerebral hemispheric white matter. Had an acute neurological change 3/19  with decreased command following and R facial droop. CTH negative for acute abnormality. MRI pending. Failed yale swallow screen and SLP consult was subsequently placed. Clinical Impression: This study was significantly limited by lethargy. He required manual assistance to maintain his head in an upright posture. Boluses pool in the pyrifrom sinues prior to pt initiating a swallow, allowing opportunity for airway invasion after the swallow, although this was not visualized. Pt protects his airway well even during successive sips of thin and nectar thick liquid, but did ultimately silently aspirate thin liquids when he was poorly attentive and encouraged to take consecutive sips (PAS 8). Residue is increased with nectar thick liquids and purees. Suspect performance this date is significantly affected by lethargy and baseline cognitive factors. Given currently decreased level of alertness, recommend he remain NPO. SLP will continue following as mentation allows to initiate a PO diet. Meds can be given crushed in puree. DIGEST Swallow Severity Rating*  Safety: 1  Efficiency: 2  Overall Pharyngeal Swallow Severity: 2 (moderate) 1: mild; 2: moderate; 3: severe; 4: profound *The Dynamic Imaging Grade of Swallowing Toxicity is standardized for the head and neck cancer population, however, demonstrates promising clinical applications across populations to standardize the clinical rating of pharyngeal swallow safety and severity. Factors that may increase risk of adverse event in presence of aspiration Rubye Oaks [employer] Clearance Coots 2021): Factors that may increase risk of adverse event in presence of aspiration Rubye Oaks [employer] Clearance Coots 2021): Poor general health and/or compromised immunity; Reduced cognitive function; Limited mobility; Frail or deconditioned; Dependence for feeding and/or oral hygiene; Weak cough Recommendations/Plan: Swallowing Evaluation Recommendations Swallowing Evaluation Recommendations Recommendations: NPO except meds;  Ice chips PRN after oral care Medication Administration: Crushed with puree Oral care recommendations: Oral care QID (4x/day); Oral care before ice chips/water; Staff/trained caregiver to provide oral care Recommended consults: Consider dietitian consultation Treatment Plan Treatment Plan Treatment recommendations: Therapy as outlined in treatment plan below Follow-up recommendations: Skilled nursing-short term rehab (<3 hours/day) Functional status assessment: Patient has had a recent decline in their functional status and demonstrates the ability to make significant improvements in function in a reasonable and predictable amount of time. Treatment frequency: Min 2x/week Treatment duration: 2 weeks Interventions: Aspiration precaution training; Compensatory techniques; Patient/family education; Trials of upgraded texture/liquids; Diet toleration management by SLP Recommendations Recommendations for follow up therapy are one component of a multi-disciplinary discharge planning process, led by the attending physician.  Recommendations may be updated based on patient status, additional functional criteria and insurance authorization. Assessment: Orofacial Exam: Orofacial Exam Oral Cavity: Oral Hygiene: WFL Oral Cavity - Dentition: Adequate natural dentition Orofacial Anatomy: WFL Oral Motor/Sensory Function: WFL Anatomy: Anatomy: Suspected cervical osteophytes Boluses Administered: Boluses Administered Boluses Administered: Thin liquids (Level 0); Mildly thick liquids (Level 2,  nectar thick); Puree  Oral Impairment Domain: Oral Impairment Domain Lip Closure: Escape beyond mid-chin Tongue control during bolus hold: Posterior escape of greater than half of bolus Bolus transport/lingual motion: Brisk tongue motion Oral residue: Trace residue lining oral structures Location of oral residue : Tongue Initiation of pharyngeal swallow : Pyriform sinuses  Pharyngeal Impairment Domain: Pharyngeal Impairment Domain Soft palate  elevation: No bolus between soft palate (SP)/pharyngeal wall (PW) Laryngeal elevation: Complete superior movement of thyroid cartilage with complete approximation of arytenoids to epiglottic petiole Anterior hyoid excursion: Complete anterior movement Epiglottic movement: Complete inversion Laryngeal vestibule closure: Complete, no air/contrast in laryngeal vestibule Pharyngeal stripping wave : Present - complete Pharyngeal contraction (A/P view only): N/A Pharyngoesophageal segment opening: Complete distension and complete duration, no obstruction of flow Tongue base retraction: Narrow column of contrast or air between tongue base and PPW Pharyngeal residue: Collection of residue within or on pharyngeal structures Location of pharyngeal residue: Tongue base; Valleculae; Pharyngeal wall; Pyriform sinuses  Esophageal Impairment Domain: No data recorded Pill: No data recorded Penetration/Aspiration Scale Score: Penetration/Aspiration Scale Score 1.  Material does not enter airway: Mildly thick liquids (Level 2, nectar thick); Puree 8.  Material enters airway, passes BELOW cords without attempt by patient to eject out (silent aspiration) : Thin liquids (Level 0) Compensatory Strategies: Compensatory Strategies Compensatory strategies: Yes Straw: Effective; Ineffective Effective Straw: Thin liquid (Level 0); Mildly thick liquid (Level 2, nectar thick) Ineffective Straw: Thin liquid (Level 0)   General Information: Caregiver present: No  Diet Prior to this Study: NPO   Temperature : Normal   Respiratory Status: WFL   Supplemental O2: None (Room air)   History of Recent Intubation: No  Behavior/Cognition: Alert; Confused; Requires cueing; Distractible Self-Feeding Abilities: Dependent for feeding Baseline vocal quality/speech: Normal Volitional Cough: Able to elicit Volitional Swallow: Unable to elicit Exam Limitations: Poor participation; Poor positioning Goal Planning: Prognosis for improved oropharyngeal function: Good  Barriers to Reach Goals: Cognitive deficits; Time post onset No data recorded Patient/Family Stated Goal: none stated Consulted and agree with results and recommendations: Patient Pain: Pain Assessment Pain Assessment: Faces Faces Pain Scale: 0 Breathing: 0 Negative Vocalization: 0 Facial Expression: 0 Body Language: 0 Consolability: 0 PAINAD Score: 0 Pain Intervention(s): Monitored during session End of Session: Start Time:SLP Start Time (ACUTE ONLY): 1354 Stop Time: SLP Stop Time (ACUTE ONLY): 1411 Time Calculation:SLP Time Calculation (min) (ACUTE ONLY): 17 min Charges: SLP Evaluations $ SLP Speech Visit: 1 Visit SLP Evaluations $BSS Swallow: 1 Procedure $MBS Swallow: 1 Procedure $Swallowing Treatment: 1 Procedure SLP visit diagnosis: SLP Visit Diagnosis: Dysphagia, oropharyngeal phase (R13.12) Past Medical History: Past Medical History: Diagnosis Date  Anxiety   Arthritis   Clotting disorder (HCC)   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   Obesity   Polycythemia vera(238.4)   History  Portal vein thrombosis   Rhinitis   Situational depression   Sleep apnea   uses CPAP every night  Tubular adenoma 12/10/2015  6 cecum polyps Past Surgical History: Past Surgical History: Procedure Laterality Date  CARDIAC CATHETERIZATION    greater 10  yrs ago, normal  CARPAL TUNNEL RELEASE    bilateral  COLONOSCOPY  06/2005  [facility] [address] [place]  ESOPHAGOGASTRODUODENOSCOPY (EGD) WITH PROPOFOL N/A 09/28/2016  Procedure: ESOPHAGOGASTRODUODENOSCOPY (EGD) WITH PROPOFOL;  Surgeon: Iva Boop, MD;  Location: [facility];  Service: Endoscopy;   Laterality: N/A;  IR GENERIC HISTORICAL  12/12/2016  IR US GUIDE VASC ACCESS RIGHT 12/12/2016 Jolaine Click, MD [facility]  IR GENERIC HISTORICAL  12/12/2016  IR VENOGRAM HEPATIC W HEMODYNAMIC EVALUATION 12/12/2016 Jolaine Click, MD [facility]  IR GENERIC HISTORICAL  12/12/2016  IR TRANSCATHETER BX 12/12/2016 Jolaine Click, MD [facility]  REVERSE SHOULDER ARTHROPLASTY Right 09/01/2021  Procedure: REVERSE SHOULDER ARTHROPLASTY;  Surgeon: Francena Hanly, MD;  Location: [facility];  Service: Orthopedics;  Laterality: Right;  ,  ROTATOR CUFF REPAIR    right  TENDON REPAIR    left arm  TONSILLECTOMY    TOTAL KNEE ARTHROPLASTY Right 2010  WISDOM TOOTH EXTRACTION   Gwynneth Aliment, M.A., CF-SLP Speech Language Pathology, Acute Rehabilitation Services Secure Chat preferred [phone] 03/06/2024, 2:45 PM  MR BRAIN WO CONTRAST Result Date: 03/05/2024 CLINICAL DATA:  Follow-up examination for stroke, altered mental status. EXAM: MRI HEAD WITHOUT CONTRAST MRA HEAD WITHOUT CONTRAST TECHNIQUE: Multiplanar, multi-echo pulse sequences of the brain and surrounding structures were acquired without intravenous contrast. Angiographic images of the Circle of Willis were acquired using MRA technique without intravenous contrast. COMPARISON:  CT from earlier the same day as well as recent MRI from 02/29/2024 FINDINGS: MRI HEAD FINDINGS Brain: Examination moderately degraded by motion artifact. Mild age-related cerebral atrophy with moderate chronic microvascular ischemic disease. No evidence for acute or subacute infarct. No acute intracranial hemorrhage. Single chronic microhemorrhage noted at the left cerebellum, stable. No mass lesion, midline shift or mass effect. No hydrocephalus or extra-axial fluid collection. Pituitary gland grossly within normal limits. Vascular: Major intracranial vascular flow voids are grossly maintained at the skull base. Skull and upper cervical spine: Craniocervical junction within normal limits. Bone  marrow signal intensity or grossly normal. No scalp soft tissue abnormality. Sinuses/Orbits: Prior bilateral ocular lens replacement. Paranasal sinuses are largely clear. Small to moderate bilateral mastoid effusions, similar to prior, and likely benign/sterile. Image nasopharynx unremarkable. Other: None. MRA HEAD FINDINGS Anterior circulation: Examination is severely degraded by motion artifact. Intracranial arterial circulation is mildly dolichoectatic in appearance. Both ICAs remain grossly patent to the siphons without visible stenosis or other abnormality. A1 segments grossly patent. Anterior communicating artery complex not well assessed due to motion. Partially visualized ACAs are grossly patent to their distal aspects. No visible M1 stenosis or occlusion. Distal MCA branches grossly perfused and symmetric. Evaluation for possible stenoses fairly limited on this motion degraded exam. Posterior circulation: Both V4 segments remain grossly patent to the vertebrobasilar junction. Right vertebral artery dominant. Neither PICA well visualized on this motion degraded exam. Basilar patent without visible stenosis. Neither superior cerebral artery well visualized. Both PCAs primarily supplied via the basilar grossly patent at their origins, not well assessed distally due to motion. Anatomic variants: None significant. IMPRESSION: MRI HEAD: 1. Motion degraded exam. 2. No acute intracranial abnormality. 3. Age-related cerebral atrophy with moderate chronic microvascular ischemic disease. MRA HEAD: 1. Severely motion degraded exam. 2. Grossly stable and negative intracranial MRA for large vessel occlusion. No visible hemodynamically significant or correctable stenosis. Electronically Signed   By: Rise Mu M.D.   On: 03/05/2024 17:49   MR ANGIO HEAD WO CONTRAST Result Date: 03/05/2024 CLINICAL DATA:  Follow-up examination for stroke, altered mental  status. EXAM: MRI HEAD WITHOUT CONTRAST MRA HEAD WITHOUT  CONTRAST TECHNIQUE: Multiplanar, multi-echo pulse sequences of the brain and surrounding structures were acquired without intravenous contrast. Angiographic images of the Circle of Willis were acquired using MRA technique without intravenous contrast. COMPARISON:  CT from earlier the same day as well as recent MRI from 02/29/2024 FINDINGS: MRI HEAD FINDINGS Brain: Examination moderately degraded by motion artifact. Mild age-related cerebral atrophy with moderate chronic microvascular ischemic disease. No evidence for acute or subacute infarct. No acute intracranial hemorrhage. Single chronic microhemorrhage noted at the left cerebellum, stable. No mass lesion, midline shift or mass effect. No hydrocephalus or extra-axial fluid collection. Pituitary gland grossly within normal limits. Vascular: Major intracranial vascular flow voids are grossly maintained at the skull base. Skull and upper cervical spine: Craniocervical junction within normal limits. Bone marrow signal intensity or grossly normal. No scalp soft tissue abnormality. Sinuses/Orbits: Prior bilateral ocular lens replacement. Paranasal sinuses are largely clear. Small to moderate bilateral mastoid effusions, similar to prior, and likely benign/sterile. Image nasopharynx unremarkable. Other: None. MRA HEAD FINDINGS Anterior circulation: Examination is severely degraded by motion artifact. Intracranial arterial circulation is mildly dolichoectatic in appearance. Both ICAs remain grossly patent to the siphons without visible stenosis or other abnormality. A1 segments grossly patent. Anterior communicating artery complex not well assessed due to motion. Partially visualized ACAs are grossly patent to their distal aspects. No visible M1 stenosis or occlusion. Distal MCA branches grossly perfused and symmetric. Evaluation for possible stenoses fairly limited on this motion degraded exam. Posterior circulation: Both V4 segments remain grossly patent to the  vertebrobasilar junction. Right vertebral artery dominant. Neither PICA well visualized on this motion degraded exam. Basilar patent without visible stenosis. Neither superior cerebral artery well visualized. Both PCAs primarily supplied via the basilar grossly patent at their origins, not well assessed distally due to motion. Anatomic variants: None significant. IMPRESSION: MRI HEAD: 1. Motion degraded exam. 2. No acute intracranial abnormality. 3. Age-related cerebral atrophy with moderate chronic microvascular ischemic disease. MRA HEAD: 1. Severely motion degraded exam. 2. Grossly stable and negative intracranial MRA for large vessel occlusion. No visible hemodynamically significant or correctable stenosis. Electronically Signed   By: Rise Mu M.D.   On: 03/05/2024 17:49   DG CHEST PORT 1 VIEW Result Date: 03/05/2024 CLINICAL DATA:  Cough. EXAM: PORTABLE CHEST 1 VIEW COMPARISON:  December 25, 2023. FINDINGS: The heart size and mediastinal contours are within normal limits. Both lungs are clear. Status post right shoulder arthroplasty. IMPRESSION: No active disease. Electronically Signed   By: Lupita Raider M.D.   On: 03/05/2024 15:59   CT HEAD CODE STROKE WO CONTRAST Result Date: 03/05/2024 CLINICAL DATA:  Code stroke. Neuro deficit, acute, stroke suspected. EXAM: CT HEAD WITHOUT CONTRAST TECHNIQUE: Contiguous axial images were obtained from the base of the skull through the vertex without intravenous contrast. RADIATION DOSE REDUCTION: This exam was performed according to the departmental dose-optimization program which includes automated exposure control, adjustment of the mA and/or kV according to patient size and/or use of iterative reconstruction technique. COMPARISON:  Head CT 02/28/2024 and MRI 02/29/2024 FINDINGS: The study is mildly to moderately motion degraded. Brain: Within limitation of motion, no acute infarct, intracranial hemorrhage, mass, midline shift, or extra-axial fluid  collection is identified. Patchy hypodensities in the cerebral white matter bilaterally are similar to the recent prior CT and are nonspecific but compatible with moderate chronic small vessel ischemic disease. There is mild cerebral atrophy. Vascular: Calcified atherosclerosis at the skull  base. No hyperdense vessel. Skull: No acute fracture or suspicious lesion. Sinuses/Orbits: Clear paranasal sinuses. Persistent small to moderate bilateral mastoid effusions. Bilateral cataract extraction. Other: None. ASPECTS ([place]berta Stroke Program Early CT Score) - Ganglionic level infarction (caudate, lentiform nuclei, internal capsule, insula, M1-M3 cortex): 7 - Supraganglionic infarction (M4-M6 cortex): 3 Total score (0-10 with 10 being normal): 10 These results were communicated to Dr. Roda Shutters at 11:56 am on 03/05/2024 by text page via the [facility] messaging system. IMPRESSION: 1. Motion degraded exam. No evidence of acute intracranial abnormality. ASPECTS of 10. 2. Moderate chronic small vessel ischemic disease. Electronically Signed   By: Sebastian Ache M.D.   On: 03/05/2024 11:57   EEG adult Result Date: 03/03/2024 Charlsie Quest, MD     03/05/2024  8:28 AM Patient Name: Rise Paganini. MRN: [id number] Epilepsy Attending: Charlsie Quest Referring Physician/Provider: Cathren Harsh, MD Date: 03/03/2024 Duration: 22.05 mins Patient history: 78yo M with ams. EEG to evaluate for seizure Level of alertness: Awake AEDs during EEG study: None Technical aspects: This EEG study was done with scalp electrodes positioned according to the 10-20 International system of electrode placement. Electrical activity was reviewed with band pass filter of 1-70Hz , sensitivity of 7 uV/mm, display speed of 39mm/sec with a 60Hz  notched filter applied as appropriate. EEG data were recorded continuously and digitally stored.  Video monitoring was available and reviewed as appropriate. Description: The posterior dominant rhythm consists of 7.5 Hz  activity of moderate voltage (25-35 uV) seen predominantly in posterior head regions, symmetric and reactive to eye opening and eye closing. EEG showed continuous generalized 6 to 7 Hz theta slowing. Hyperventilation and photic stimulation were not performed.   ABNORMALITY - Continuous slow, generalized IMPRESSION: This study is suggestive of mild diffuse encephalopathy. No seizures or epileptiform discharges were seen throughout the recording. Charlsie Quest   MR BRAIN WO CONTRAST Result Date: 02/29/2024 CLINICAL DATA:  Transient ischemic attack.  Mental status changes. EXAM: MRI HEAD WITHOUT CONTRAST TECHNIQUE: Multiplanar, multiecho pulse sequences of the brain and surrounding structures were obtained without intravenous contrast. COMPARISON:  CT yesterday.  MRI 01/27/2024. FINDINGS: Brain: Diffusion imaging does not show any acute or subacute infarction or other cause of restricted diffusion. No focal abnormality affects the brainstem. Few old small vessel cerebellar infarctions. Cerebral hemispheres show age related volume loss with moderate chronic small-vessel ischemic changes affecting the deep and subcortical white matter. No cortical or large vessel territory stroke. No mass, acute hemorrhage, hydrocephalus or extra-axial collection. Small focus of hemosiderin deposition in the left cerebellum. Vascular: Major vessels at the base of the brain show flow. Skull and upper cervical spine: Negative Sinuses/Orbits: Clear/normal Other: Bilateral mastoid effusions as present previously. IMPRESSION: 1. No acute finding by MRI. Age related volume loss. Moderate chronic small-vessel ischemic changes of the cerebral hemispheric white matter. Few old small vessel cerebellar infarctions. 2. Bilateral mastoid effusions as present previously. Electronically Signed   By: Paulina Fusi M.D.   On: 02/29/2024 13:06   CT Head Wo Contrast Result Date: 02/28/2024 CLINICAL DATA:  Weakness EXAM: CT HEAD WITHOUT CONTRAST  TECHNIQUE: Contiguous axial images were obtained from the base of the skull through the vertex without intravenous contrast. RADIATION DOSE REDUCTION: This exam was performed according to the departmental dose-optimization program which includes automated exposure control, adjustment of the mA and/or kV according to patient size and/or use of iterative reconstruction technique. COMPARISON:  08/06/2024 FINDINGS: Brain: There is no mass, hemorrhage or extra-axial collection. There is  generalized atrophy without lobar predilection. Hypodensity of the white matter is most commonly associated with chronic microvascular disease. Vascular: Tortuous basilar artery, unchanged. Skull: The visualized skull base, calvarium and extracranial soft tissues are normal. Sinuses/Orbits: Small left mastoid effusion. Paranasal sinuses are clear. Normal orbits. Other: None. IMPRESSION: 1. No acute intracranial abnormality. 2. Generalized atrophy and findings of chronic microvascular disease. 3. Small left mastoid effusion. Electronically Signed   By: Deatra Robinson M.D.   On: 02/28/2024 19:12     PHYSICAL EXAM  Temp:  [98.1 F (36.7 C)-98.8 F (37.1 C)] 98.7 F (37.1 C) (03/20 1611) Pulse Rate:  [54-86] 84 (03/20 1611) Resp:  [16-18] 16 (03/20 1611) BP: (146-174)/(73-110) 146/73 (03/20 1635) SpO2:  [91 %-98 %] 97 % (03/20 1611)  General - Well nourished, well developed, in no apparent distress, drowsy sleepy.  Ophthalmologic - fundi not visualized due to noncooperation.  Cardiovascular - Regular rhythm and rate.  Neuro - drowsy sleepy and eyes closed, hard to arouse. Nonverbal or following commands. When forced eye opening, eyes midline, not blinking to visual threat bilaterally. R mild nasolabial fold flattening but largely due to he neck kept in the right turning position, he would grimace when attempted to push his neck to the left. Tongue protrusion not cooperative. BUEs drift to bed within 10 sec. BLEs able to  hold knee flexion and foot on bed position less than 5 secs. Sensation, coordination not cooperative and gait not tested.    ASSESSMENT/PLAN [name]. is a 78 y.o. male with PMH of HTN, hyperlipidemia, diabetes, GERD, history of splenic infarct, portal vein thrombosis, polycythemia vera, OSA, NASH cirrhosis, anemia, CML, anxiety admitted on 3/14 for worsening weakness. Code stroke 3/19 for right facial droop and leaning to the right. Stat CT no acute finding. Pt not TNK candidate given on Xarelto. MRI and MRA no stroke or LVO. Concerning for aspiration, did not pass swallow, no NPO.   Encephalopathy from deconditioning, AKI on CKD, and aspiration Drowsy sleepy, hard to arouse Received Ativan yesterday afternoon but no Seroquel last night MRI no acute infarct MRA no LVO 2D Echo 55 to 60% Ammonia level 46->23 Creatinine 1.46--1.28--1.31--1.55--1.54 UA and CXR negative Xarelto 10 for VTE prophylaxis Xarelto 10 prior to admission, now on Xarelto 10, currently on hold due to NPO Therapy recommendations: SNF Disposition: Pending  Diabetes HgbA1c 6.6 in 01/2024 goal < 7.0 Controlled CBG monitoring SSI DM education and close PCP follow up  Hypertension Stable on high end Consider p.o. BP meds after p.o. access Long term BP goal normotensive  Hyperlipidemia Home meds: Crestor 20 LDL 31 in 01/2024, goal < 70 Now on Crestor 20, resume after p.o. access Continue statin at discharge  Dysphagia Intermittent coughing on food Currently n.p.o. Speech on board On IV fluid  Other Active Problems Hypercalcemia, calcium 11.1--10.6--10.8, on IV fluid  Hospital day # 6  Neurology will sign off. Please call with questions.  No neuro follow-up needed at this time.  Thanks for the consult.   Marvel Plan, MD PhD Stroke Neurology 03/06/2024 5:03 PM    To contact Stroke Continuity provider, please refer to [email or web address]. After hours, contact General Neurology

## 2024-03-06 NOTE — Progress Notes (Signed)
 SLP Cancellation Note  Patient Details Name: James [name]. MRN: [id number] DOB: 11-27-1946   Cancelled treatment:       Reason Eval/Treat Not Completed: Orders for a cognitive-linguistic evaluation received. One was completed 3/18 with findings of severe cognitive impairment with recommendations to f/u at next venue of care. Given MRI 3/20 without acute findings and pt's family's report that pt appears to be at his baseline, no further SLP f/u for cognition is warranted acutely. Will sign off.     Gwynneth Aliment, M.A., CF-SLP Speech Language Pathology, Acute Rehabilitation Services  Secure Chat preferred [phone]  03/06/2024, 10:01 AM

## 2024-03-06 NOTE — Progress Notes (Signed)
Patient back to the unit.

## 2024-03-06 NOTE — Progress Notes (Signed)
 Modified Barium Swallow Study  Patient Details  Name: James [name]. MRN: [id number] Date of Birth: [birth year]  Today's Date: 03/06/2024  Modified Barium Swallow completed.  Full report located under Chart Review in the Imaging Section.  History of Present Illness Patient is a 78 y.o. male with PMH: HTN, HLD, DM, GERD, splenic infarct, portal vein thrombosis, polycythemia, OSA, NASH cirrhosis, anemia, anxiety. He presented to the hospital on 02/29/24 with AMS, worsening weakness, dizziness, unsteady gait. It has been increasingly difficult for his spouse to care for him. Patient has been exhibiting increasing agitation directed at spouse at nighttime. He was admitted with gait abnormality, generalized debility, acute metabolic encephalopathy with underlying cognitive dysfunction/vascular dementia. MRI brain negative for acute finding but did show moderate chronic small-vessel ischemic changes of the cerebral hemispheric white matter. Had an acute neurological change 3/19 with decreased command following and R facial droop. CTH negative for acute abnormality. MRI pending. Failed yale swallow screen and SLP consult was subsequently placed.   Clinical Impression This study was significantly limited by lethargy. He required manual assistance to maintain his head in an upright posture. Boluses pool in the pyrifrom sinues prior to pt initiating a swallow, allowing opportunity for airway invasion after the swallow, although this was not visualized. Pt protects his airway well even during successive sips of thin and nectar thick liquid, but did ultimately silently aspirate thin liquids when he was poorly attentive and encouraged to take consecutive sips (PAS 8). Residue is increased with nectar thick liquids and purees. Suspect performance this date is significantly affected by lethargy and baseline cognitive factors. Given currently decreased level of alertness, recommend he remain NPO. SLP will continue  following as mentation allows to initiate a PO diet. Meds can be given crushed in puree.  DIGEST Swallow Severity Rating*  Safety: 1  Efficiency: 2  Overall Pharyngeal Swallow Severity: 2 (moderate) 1: mild; 2: moderate; 3: severe; 4: profound  *The Dynamic Imaging Grade of Swallowing Toxicity is standardized for the head and neck cancer population, however, demonstrates promising clinical applications across populations to standardize the clinical rating of pharyngeal swallow safety and severity.  Factors that may increase risk of adverse event in presence of aspiration [employer]aks [employer] Clearance Coots 2021): Poor general health and/or compromised immunity;Reduced cognitive function;Limited mobility;Frail or deconditioned;Dependence for feeding and/or oral hygiene;Weak cough  Swallow Evaluation Recommendations Recommendations: NPO except meds;Ice chips PRN after oral care Medication Administration: Crushed with puree Oral care recommendations: Oral care QID (4x/day);Oral care before ice chips/water;Staff/trained caregiver to provide oral care Recommended consults: Consider dietitian consultation    Gwynneth Aliment, M.A., CF-SLP Speech Language Pathology, Acute Rehabilitation Services  Secure Chat preferred [phone]  03/06/2024,2:42 PM

## 2024-03-06 NOTE — Progress Notes (Signed)
 Patient off the unit for MBS.

## 2024-03-06 NOTE — Plan of Care (Signed)
  Problem: Clinical Measurements: Goal: Respiratory complications will improve Outcome: Progressing Goal: Cardiovascular complication will be avoided Outcome: Progressing   Problem: Coping: Goal: Level of anxiety will decrease Outcome: Progressing   Problem: Elimination: Goal: Will not experience complications related to bowel motility Outcome: Progressing Goal: Will not experience complications related to urinary retention Outcome: Progressing   Problem: Skin Integrity: Goal: Risk for impaired skin integrity will decrease Outcome: Progressing

## 2024-03-06 NOTE — Progress Notes (Signed)
 Speech Language Pathology Treatment: Dysphagia  Patient Details Name: James [name]. MRN: [id number] DOB: 11/22/1946 Today's Date: 03/06/2024 Time: 0102-7253 SLP Time Calculation (min) (ACUTE ONLY): 10 min  Assessment / Plan / Recommendation Clinical Impression  Pt remains lethargic, but rouses for short periods of time given Mod multimodal cueing. He continues to exhibit multiple swallows and immediate coughing after trials of thin liquids. There are brief periods of oral holding with purees and solids, which is intermittently followed by throat clearance. Recommend NPO status be maintained pending MBS, which is tentatively scheduled to occur later this date.    HPI HPI: Patient is a 78 y.o. male with PMH: HTN, HLD, DM, GERD, splenic infarct, portal vein thrombosis, polycythemia, OSA, NASH cirrhosis, anemia, anxiety. He presented to the hospital on 02/29/24 with AMS, worsening weakness, dizziness, unsteady gait. It has been increasingly difficult for his spouse to care for him. Patient has been exhibiting increasing agitation directed at spouse at nighttime. He was admitted with gait abnormality, generalized debility, acute metabolic encephalopathy with underlying cognitive dysfunction/vascular dementia. MRI brain negative for acute finding but did show moderate chronic small-vessel ischemic changes of the cerebral hemispheric white matter. Had an acute neurological change 3/19 with decreased command following and R facial droop. MRI negative for acute abnormality. Failed yale swallow screen and SLP consult was subsequently placed.      SLP Plan  MBS      Recommendations for follow up therapy are one component of a multi-disciplinary discharge planning process, led by the attending physician.  Recommendations may be updated based on patient status, additional functional criteria and insurance authorization.    Recommendations  Diet recommendations: NPO Medication Administration: Crushed with  puree                  Oral care QID;Staff/trained caregiver to provide oral care   Frequent or constant Supervision/Assistance Dysphagia, unspecified (R13.10)     MBS     Gwynneth Aliment, M.A., CF-SLP Speech Language Pathology, Acute Rehabilitation Services  Secure Chat preferred [phone]   03/06/2024, 10:04 AM

## 2024-03-07 ENCOUNTER — Other Ambulatory Visit (HOSPITAL_COMMUNITY)

## 2024-03-07 DIAGNOSIS — R269 Unspecified abnormalities of gait and mobility: Secondary | ICD-10-CM | POA: Diagnosis not present

## 2024-03-07 LAB — COMPREHENSIVE METABOLIC PANEL
ALT: 17 U/L (ref 0–44)
AST: 60 U/L — ABNORMAL HIGH (ref 15–41)
Albumin: 3 g/dL — ABNORMAL LOW (ref 3.5–5.0)
Alkaline Phosphatase: 84 U/L (ref 38–126)
Anion gap: 16 — ABNORMAL HIGH (ref 5–15)
BUN: 27 mg/dL — ABNORMAL HIGH (ref 8–23)
CO2: 16 mmol/L — ABNORMAL LOW (ref 22–32)
Calcium: 10.5 mg/dL — ABNORMAL HIGH (ref 8.9–10.3)
Chloride: 110 mmol/L (ref 98–111)
Creatinine, Ser: 1.44 mg/dL — ABNORMAL HIGH (ref 0.61–1.24)
GFR, Estimated: 50 mL/min — ABNORMAL LOW (ref >60)
Glucose, Bld: 241 mg/dL — ABNORMAL HIGH (ref 70–99)
Potassium: 3.7 mmol/L (ref 3.5–5.1)
Sodium: 142 mmol/L (ref 135–145)
Total Bilirubin: 0.8 mg/dL (ref 0.0–1.2)
Total Protein: 6.6 g/dL (ref 6.5–8.1)

## 2024-03-07 LAB — GLUCOSE, CAPILLARY
Glucose-Capillary: 243 mg/dL — ABNORMAL HIGH (ref 70–99)
Glucose-Capillary: 226 mg/dL — ABNORMAL HIGH (ref 70–99)
Glucose-Capillary: 150 mg/dL — ABNORMAL HIGH (ref 70–99)
Glucose-Capillary: 95 mg/dL (ref 70–99)

## 2024-03-07 LAB — AMMONIA: Ammonia: 22 umol/L (ref 9–35)

## 2024-03-07 LAB — PROCALCITONIN: Procalcitonin: 0.12 ng/mL

## 2024-03-07 LAB — LACTIC ACID, PLASMA: Lactic Acid, Venous: 0.8 mmol/L (ref 0.5–1.9)

## 2024-03-07 MED ORDER — SODIUM CHLORIDE 0.9 % IV SOLN: INTRAVENOUS | Status: DC

## 2024-03-07 MED ORDER — SODIUM BICARBONATE 650 MG PO TABS
650.0000 mg | ORAL_TABLET | Freq: Two times a day (BID) | ORAL | Status: DC
  Administered 2024-03-08: 650 mg via ORAL
  Filled 2024-03-07: qty 1

## 2024-03-07 NOTE — Progress Notes (Signed)
 Physical Therapy Treatment Patient Details Name: James [name]. MRN: [id number] DOB: [birth year] Today's Date: 03/07/2024   History of Present Illness 78 y/o male presents to [facility] on 3/14 with progressive weakness and confusion. Patient was recently admitted 2/8 until 2/10 with altered mental status, dizziness, unsteady gait. PMH includes: hypertension, hyperlipidemia, diabetes, GERD, splenic infarct, portal vein thrombosis, polycythemia vera, OSA, NASH cirrhosis, anemia, CML, anxiety    PT Comments  Patient able to mobilize OOB this session to [facility] due to incontinent of BM.  Needing heavy +2 A for squat pivot to [facility] with pt fearful and not standing erect.  Stood better to [facility] and easier with anterior weight shift when using rail to pull up.  Patient still confused and needing help for orientation and repeated directions for command following.  Continue to feel he will need inpatient rehab prior to d/c home.     If plan is discharge home, recommend the following: Assistance with cooking/housework;Direct supervision/assist for medications management;Direct supervision/assist for financial management;Assist for transportation;Help with stairs or ramp for entrance;Supervision due to cognitive status;Two people to help with walking and/or transfers   Can travel by private vehicle     No  Equipment Recommendations  None recommended by PT    Recommendations for Other Services       Precautions / Restrictions Precautions Precautions: Fall Recall of Precautions/Restrictions: Impaired     Mobility  Bed Mobility Overal bed mobility: Needs Assistance Bed Mobility: Supine to Sit, Rolling Rolling: Max assist, Used rails   Supine to sit: HOB elevated, Used rails, Max assist, +2 for physical assistance     General bed mobility comments: assist to roll for hygiene, assist for legs off EOB and to sit up trunk    Transfers Overall transfer level: Needs assistance Equipment used: Ambulation  equipment used Transfers: Sit to/from Stand, Bed to chair/wheelchair/BSC Sit to Stand: Mod assist, +2 physical assistance     Squat pivot transfers: +2 physical assistance, Max assist     General transfer comment: up to [facility] with squat pivot and max A for balance, moving hips and positioning on BSC; up to stand to [facility] for hygiene after finishing toileting with mod A (+2 for safety) pt leaning more forward with Stedy and able to pull up on rail; moved to recliner via Designer, television/film set via Lift Equipment: Stedy  Ambulation/Gait               General Gait Details: unable this session   Stairs             Wheelchair Mobility     Tilt Bed    Modified Rankin (Stroke Patients Only)       Balance Overall balance assessment: Needs assistance Sitting-balance support: Feet supported Sitting balance-Leahy Scale: Poor Sitting balance - Comments: leaning back on EOB and on BSC with A for positioning for safety   Standing balance support: Bilateral upper extremity supported, Reliant on assistive device for balance Standing balance-Leahy Scale: Poor Standing balance comment: able to stand with Stedy and mod A for hygiene                            Communication Communication Communication: No apparent difficulties  Cognition Arousal: Alert Behavior During Therapy: Anxious   PT - Cognitive impairments: History of cognitive impairments, Awareness, Orientation, Memory, Attention, Initiation, Sequencing, Problem solving, Safety/Judgement  PT - Cognition Comments: fearful of falling with rolling in bed during hygiene due to incontinent of BM; reported could not feel he had to go though once on BSC able to evacuate bowels with time and cues; Following commands: Impaired Following commands impaired: Follows one step commands inconsistently, Follows one step commands with increased time    Cueing    Exercises      General Comments         Pertinent Vitals/Pain Pain Assessment Pain Assessment: Faces Faces Pain Scale: Hurts little more Pain Location: bottom with hygiene efforts after BM Pain Descriptors / Indicators: Discomfort Pain Intervention(s): Monitored during session, Repositioned    Home Living                          Prior Function            PT Goals (current goals can now be found in the care plan section) Progress towards PT goals: Progressing toward goals    Frequency    Min 2X/week      PT Plan      Co-evaluation              AM-PAC PT "6 Clicks" Mobility   Outcome Measure  Help needed turning from your back to your side while in a flat bed without using bedrails?: Total Help needed moving from lying on your back to sitting on the side of a flat bed without using bedrails?: Total Help needed moving to and from a bed to a chair (including a wheelchair)?: Total Help needed standing up from a chair using your arms (e.g., wheelchair or bedside chair)?: Total Help needed to walk in hospital room?: Total Help needed climbing 3-5 steps with a railing? : Total 6 Click Score: 6    End of Session Equipment Utilized During Treatment: Gait belt Activity Tolerance: Patient limited by fatigue Patient left: in chair;with call bell/phone within reach;with chair alarm set;with family/visitor present Nurse Communication: Mobility status;Need for lift equipment PT Visit Diagnosis: Unsteadiness on feet (R26.81);Repeated falls (R29.6);Muscle weakness (generalized) (M62.81);History of falling (Z91.81);Difficulty in walking, not elsewhere classified (R26.2)     Time: 1207-1249 PT Time Calculation (min) (ACUTE ONLY): 42 min  Charges:    $Therapeutic Activity: 38-52 mins PT General Charges $$ ACUTE PT VISIT: 1 Visit                     Sheran Lawless, PT Acute Rehabilitation Services [phone] 03/07/2024    Elray Mcgregor 03/07/2024, 6:08 PM

## 2024-03-07 NOTE — Plan of Care (Signed)

## 2024-03-07 NOTE — TOC Progression Note (Signed)
 Transition of Care (TOC) - Progression Note    Patient Details  Name: James [name]. MRN: [id number] Date of Birth: [birth year]  Transition of Care [facility]) CM/SW Contact  Baldemar Lenis, [place] Phone Number: 03/07/2024, 1:10 PM  Clinical Narrative:   CSW following for disposition. [facility] has no beds available over the weekend, earliest bed availability is Monday if stable. CSW to follow.    Expected Discharge Plan: Skilled Nursing Facility Barriers to Discharge: Continued Medical Work up  Expected Discharge Plan and Services       Living arrangements for the past 2 months: Single Family Home                                       Social Determinants of Health (SDOH) Interventions SDOH Screenings   Food Insecurity: No Food Insecurity (02/29/2024)  Housing: Low Risk  (02/29/2024)  Transportation Needs: No Transportation Needs (02/29/2024)  Utilities: Not At Risk (02/29/2024)  Depression (PHQ2-9): Medium Risk (05/25/2020)  Social Connections: Socially Isolated (02/29/2024)  Tobacco Use: Medium Risk (02/29/2024)    Readmission Risk Interventions     No data to display

## 2024-03-07 NOTE — Progress Notes (Signed)
 Speech Language Pathology Treatment: Dysphagia  Patient Details Name: James [name]. MRN: [id number] DOB: [birth year] Today's Date: 03/07/2024 Time: 4098-1191 SLP Time Calculation (min) (ACUTE ONLY): 17 min  Assessment / Plan / Recommendation Clinical Impression  Pt's mentation and level of alertness have improved significantly this date. He is attentive and responsive to this SLP and family members. He continues to have immediate throat clearance and coughing intermittently following trials of thin liquids. Note aspiration of thin liquids was silent and trace on MBS 3/20, but coughing was eliminated today with trials of nectar thick liquids even via sequential straw sips. Pt is better able to attend to oral transit, masticating solids promptly and thoroughly. Recommend initiating diet of Dys 3 solids with nectar thick liquids given acute deconditioning and fluctuating mentation. May consider repeating MBS prior to pt discharge to reassess swallowing as mentation improves further. Education was provided to pt, his wife, and his granddaughter. Discussed with RN and SLP will f/u.    HPI HPI: Patient is a 78 y.o. male with PMH: HTN, HLD, DM, GERD, splenic infarct, portal vein thrombosis, polycythemia, OSA, NASH cirrhosis, anemia, anxiety. He presented to the hospital on 02/29/24 with AMS, worsening weakness, dizziness, unsteady gait. It has been increasingly difficult for his spouse to care for him. Patient has been exhibiting increasing agitation directed at spouse at nighttime. He was admitted with gait abnormality, generalized debility, acute metabolic encephalopathy with underlying cognitive dysfunction/vascular dementia. MRI brain negative for acute finding but did show moderate chronic small-vessel ischemic changes of the cerebral hemispheric white matter. Had an acute neurological change 3/19 with decreased command following and R facial droop. CTH negative for acute abnormality. MRI pending. Failed  yale swallow screen and SLP consult was subsequently placed.      SLP Plan  Continue with current plan of care      Recommendations for follow up therapy are one component of a multi-disciplinary discharge planning process, led by the attending physician.  Recommendations may be updated based on patient status, additional functional criteria and insurance authorization.    Recommendations  Diet recommendations: Dysphagia 3 (mechanical soft);Nectar-thick liquid Liquids provided via: Cup;Straw Medication Administration: Crushed with puree Supervision: Staff to assist with self feeding;Full supervision/cueing for compensatory strategies;Trained caregiver to feed patient Compensations: Slow rate;Small sips/bites Postural Changes and/or Swallow Maneuvers: Seated upright 90 degrees                  Oral care BID   Frequent or constant Supervision/Assistance Dysphagia, oropharyngeal phase (R13.12)     Continue with current plan of care     Gwynneth Aliment, M.A., CF-SLP Speech Language Pathology, Acute Rehabilitation Services  Secure Chat preferred [phone]   03/07/2024, 10:44 AM

## 2024-03-07 NOTE — Progress Notes (Signed)
 [facility]                                                                              Dawayne Ohair, is a 78 y.o. male, DOB - [birth year], ZOX:[id number] Admit date - 02/29/2024    Outpatient Primary MD for the patient is [name], MD  LOS - 7  days  Chief Complaint  Patient presents with   Altered Mental Status   Weakness       Brief summary   Patient is a 78 year old male with HTN, hyperlipidemia, diabetes, GERD, history of splenic infarct, portal vein thrombosis, polycythemia vera, OSA, NASH cirrhosis, anemia, CML, anxiety presented with worsening weakness.  He was admitted 2/8-2/10 with altered mental status, dizziness, unsteady gait and had full neurology workup.  It was felt that his CML treatment Dasatinib was contributing to this and was discontinued.  Confusion has improved however he has had worsening generalized weakness, unsteady gait, needing help with transfers in the last 2 to 3 days.  Wife can no longer help care for him and patient is needing placement.  He was seen in the med [facility] ED on 3/13 and again presented to the hospital on 3/14. EDP also requested neurology to see the patient  03/07/2024: Patient seen alongside patient's wife, brother-in-law and patient's son's girlfriend, in the presence of patient's nurse.  Patient's family confirmed that patient forgot to pay his bills for about 2 months preceding admission.  Family is also concerned about possible parkinsonian features, with associated visual hallucination (according to patient's family) .  No prior diagnosis of Lewy body dementia.  CO2 of 16 is noted.  Abdominal ultrasound done about 3 years ago revealed infrarenal abdominal aortic aneurysm, enlarged spleen, liver and renal cysts.  Will repeat abdominal x-ray.  AST/ALT ratio is greater than 2.  Will check gamma GT.  Neurology input is appreciated.   Assessment & Plan    Principal Problem:   Gait abnormality, Generalized  debility --CT head on 3/13: negative for acute intracranial abnormality -MRI brain 3/14 showed no acute findings, age-related volume loss, chronic small vessel ischemic changes of the cerebral hemispheric white matter, old small vessel cerebellar infarctions.  Bilateral mastoid effusions. -CK normal. Vitamin D low, placed on replacement.   - B12 normal, folate normal, has iron deficiency -TSH normal 1.7, CRP 0.9, ESR 1. -On 3/19, during PT, patient was noted to be leaning towards the right with facial drooping, code stroke called.  Seen by neurology -MRI brain with no acute intracranial abnormality.  Age-related cerebral atrophy with moderate chronic microvascular ischemic disease.  - MRA negative for large vessel occlusion, no visible hemodynamically significant stenosis -N.p.o. for SLP eval.  PT OT eval.  -Discussed with Dr. Roda Shutters, continue Seroquel but at earlier time, changed timings to 8 PM. -Continue Xarelto  Active problems Acute metabolic encephalopathy, with underlying cognitive dysfunction/vascular dementia -Ammonia level normal, no acute infectious process, likely has underlying dementia -Creatinine 1.31 with hemoglobin 17.0, placed on IV fluids -EKG repeated, QTc 446 -EEG showed mild diffuse encephalopathy, no seizures or epileptiform discharges -See #1 -Will consult palliative medicine 03/07/2024: Concerning for possible  Lewy body dementia.  See above documentation.  Acute kidney injury -Creatinine 1.6 on admission, was 1.5 on 3/14, 1.4 on 3/13 and 1.3 on 2/10 -Creatinine 1.5, on gentle hydration 03/07/2024: Abdominal ultrasound.  Hypercalcemia, chronic -Calcium 11.5 on 01/28/2024 - Vitamin D 1.25OH low, PTH level, PTH rP pending, received Zometa IV x 1 on 3/19 -Corrected calcium 11.7   Hypertension -BP elevated, continue propranolol -Continue hydralazine IV as needed with parameters  Hyperlipidemia -CK normal, resumed statin   Diabetes -Continue sliding scale insulin  while inpatient CBG (last 3)  Recent Labs    03/07/24 0613 03/07/24 1129 03/07/24 1616  GLUCAP 243* 226* 150*  -Continue moderate SSI  Chronic nocturia -Continue DDAVP at night, follow sodium level   History of splenic infarct - Continue home Xarelto   Polycythemia vera, CML  Followed by oncology. Dr Myna Hidalgo outpatient.  Was on Sprycel with good response and at last eval in February was considered to be in remission.  Sprycel was discontinued.    NASH cirrhosis LFTs stable   OSA - Continue home CPAP   Estimated body mass index is 25.5 kg/m as calculated from the following:   Height as of this encounter: 5\' 6"  (1.676 m).   Weight as of this encounter: 71.7 kg.  Code Status: DNR DVT Prophylaxis:  rivaroxaban (XARELTO) tablet 10 mg Start: 03/01/24 1000 rivaroxaban (XARELTO) tablet 10 mg   Level of Care: Level of care: Telemetry Medical Family Communication: Updated patient's wife and son at the bedside at the bedside on 3/19 Disposition Plan:      Remains inpatient appropriate: PT OT evaluation recommended SNF   Procedures:  None  Consultants:   Neurology   Antimicrobials:   Anti-infectives (From admission, onward)    None          Medications  buPROPion  300 mg Oral Daily   desmopressin  400 mcg Oral QHS   insulin aspart  0-15 Units Subcutaneous TID WC   insulin aspart  0-5 Units Subcutaneous QHS   propranolol  10 mg Oral Daily   QUEtiapine  25 mg Oral QHS   rivaroxaban  10 mg Oral Daily   rosuvastatin  20 mg Oral QHS   sodium chloride flush  3 mL Intravenous Q12H      Subjective:  Patient remains confused.  Objective:   Vitals:   03/07/24 1000 03/07/24 1155 03/07/24 1617 03/07/24 2004  BP: (!) 149/82 97/80 122/77 (!) 131/92  Pulse:  73 72 67  Resp:  18 16 16   Temp:  98.5 F (36.9 C) 97.8 F (36.6 C) 97.7 F (36.5 C)  TempSrc:  Oral Oral Axillary  SpO2:  96% 96% 94%  Weight:      Height:        Intake/Output Summary (Last 24  hours) at 03/07/2024 2106 Last data filed at 03/07/2024 1800 Gross per 24 hour  Intake 2162.13 ml  Output 950 ml  Net 1212.13 ml     Wt Readings from Last 3 Encounters:  02/29/24 71.7 kg  02/28/24 71.7 kg  01/28/24 77.2 kg   Physical Exam General: Not in any distress.  Awake. Cardiovascular: S1 S2 clear.  Respiratory: Clear to auscultation. Gastrointestinal: Obese, soft and nontender.   Ext: No leg edema. Neuro: Awake and alert.  Remains confused.   Data Reviewed:  I have personally reviewed following labs    CBC Lab Results  Component Value Date   WBC 11.4 (H) 03/06/2024   RBC 5.09 03/06/2024   HGB  15.1 03/06/2024   HCT 47.8 03/06/2024   MCV 93.9 03/06/2024   MCH 29.7 03/06/2024   PLT 220 03/06/2024   MCHC 31.6 03/06/2024   RDW 14.7 03/06/2024   LYMPHSABS 0.9 01/26/2024   MONOABS 0.9 01/26/2024   EOSABS 0.2 01/26/2024   BASOSABS 0.0 01/26/2024     Last metabolic panel Lab Results  Component Value Date   NA 142 03/07/2024   K 3.7 03/07/2024   CL 110 03/07/2024   CO2 16 (L) 03/07/2024   BUN 27 (H) 03/07/2024   CREATININE 1.44 (H) 03/07/2024   GLUCOSE 241 (H) 03/07/2024   GFRNONAA 50 (L) 03/07/2024   GFRAA 53 (L) 09/02/2020   CALCIUM 10.5 (H) 03/07/2024   PHOS 2.6 03/04/2024   PROT 6.6 03/07/2024   ALBUMIN 3.0 (L) 03/07/2024   LABGLOB 2.4 10/25/2016   AGRATIO 1.7 10/25/2016   BILITOT 0.8 03/07/2024   ALKPHOS 84 03/07/2024   AST 60 (H) 03/07/2024   ALT 17 03/07/2024   ANIONGAP 16 (H) 03/07/2024    CBG (last 3)  Recent Labs    03/07/24 0613 03/07/24 1129 03/07/24 1616  GLUCAP 243* 226* 150*      Coagulation Profile: No results for input(s): "INR", "PROTIME" in the last 168 hours.   Radiology Studies: I have personally reviewed the imaging studies  DG Swallowing Func-Speech Pathology Result Date: 03/06/2024 Table formatting from the original result was not included. Modified Barium Swallow Study Patient Details Name: Donel Osowski. MRN:  [id number] Date of Birth: 21-May-1946 Today's Date: 03/06/2024 HPI/PMH: HPI: Patient is a 78 y.o. male with PMH: HTN, HLD, DM, GERD, splenic infarct, portal vein thrombosis, polycythemia, OSA, NASH cirrhosis, anemia, anxiety. He presented to the hospital on 02/29/24 with AMS, worsening weakness, dizziness, unsteady gait. It has been increasingly difficult for his spouse to care for him. Patient has been exhibiting increasing agitation directed at spouse at nighttime. He was admitted with gait abnormality, generalized debility, acute metabolic encephalopathy with underlying cognitive dysfunction/vascular dementia. MRI brain negative for acute finding but did show moderate chronic small-vessel ischemic changes of the cerebral hemispheric white matter. Had an acute neurological change 3/19 with decreased command following and R facial droop. CTH negative for acute abnormality. MRI pending. Failed yale swallow screen and SLP consult was subsequently placed. Clinical Impression: This study was significantly limited by lethargy. He required manual assistance to maintain his head in an upright posture. Boluses pool in the pyrifrom sinues prior to pt initiating a swallow, allowing opportunity for airway invasion after the swallow, although this was not visualized. Pt protects his airway well even during successive sips of thin and nectar thick liquid, but did ultimately silently aspirate thin liquids when he was poorly attentive and encouraged to take consecutive sips (PAS 8). Residue is increased with nectar thick liquids and purees. Suspect performance this date is significantly affected by lethargy and baseline cognitive factors. Given currently decreased level of alertness, recommend he remain NPO. SLP will continue following as mentation allows to initiate a PO diet. Meds can be given crushed in puree. DIGEST Swallow Severity Rating*  Safety: 1  Efficiency: 2  Overall Pharyngeal Swallow Severity: 2 (moderate) 1: mild; 2:  moderate; 3: severe; 4: profound *The Dynamic Imaging Grade of Swallowing Toxicity is standardized for the head and neck cancer population, however, demonstrates promising clinical applications across populations to standardize the clinical rating of pharyngeal swallow safety and severity. Factors that may increase risk of adverse event in presence of aspiration Rubye Oaks & Clearance Coots  2021): Factors that may increase risk of adverse event in presence of aspiration Rubye Oaks & Clearance Coots 2021): Poor general health and/or compromised immunity; Reduced cognitive function; Limited mobility; Frail or deconditioned; Dependence for feeding and/or oral hygiene; Weak cough Recommendations/Plan: Swallowing Evaluation Recommendations Swallowing Evaluation Recommendations Recommendations: NPO except meds; Ice chips PRN after oral care Medication Administration: Crushed with puree Oral care recommendations: Oral care QID (4x/day); Oral care before ice chips/water; Staff/trained caregiver to provide oral care Recommended consults: Consider dietitian consultation Treatment Plan Treatment Plan Treatment recommendations: Therapy as outlined in treatment plan below Follow-up recommendations: Skilled nursing-short term rehab (<3 hours/day) Functional status assessment: Patient has had a recent decline in their functional status and demonstrates the ability to make significant improvements in function in a reasonable and predictable amount of time. Treatment frequency: Min 2x/week Treatment duration: 2 weeks Interventions: Aspiration precaution training; Compensatory techniques; Patient/family education; Trials of upgraded texture/liquids; Diet toleration management by SLP Recommendations Recommendations for follow up therapy are one component of a multi-disciplinary discharge planning process, led by the attending physician.  Recommendations may be updated based on patient status, additional functional criteria and insurance authorization.  Assessment: Orofacial Exam: Orofacial Exam Oral Cavity: Oral Hygiene: WFL Oral Cavity - Dentition: Adequate natural dentition Orofacial Anatomy: WFL Oral Motor/Sensory Function: WFL Anatomy: Anatomy: Suspected cervical osteophytes Boluses Administered: Boluses Administered Boluses Administered: Thin liquids (Level 0); Mildly thick liquids (Level 2, nectar thick); Puree  Oral Impairment Domain: Oral Impairment Domain Lip Closure: Escape beyond mid-chin Tongue control during bolus hold: Posterior escape of greater than half of bolus Bolus transport/lingual motion: Brisk tongue motion Oral residue: Trace residue lining oral structures Location of oral residue : Tongue Initiation of pharyngeal swallow : Pyriform sinuses  Pharyngeal Impairment Domain: Pharyngeal Impairment Domain Soft palate elevation: No bolus between soft palate (SP)/pharyngeal wall (PW) Laryngeal elevation: Complete superior movement of thyroid cartilage with complete approximation of arytenoids to epiglottic petiole Anterior hyoid excursion: Complete anterior movement Epiglottic movement: Complete inversion Laryngeal vestibule closure: Complete, no air/contrast in laryngeal vestibule Pharyngeal stripping wave : Present - complete Pharyngeal contraction (A/P view only): N/A Pharyngoesophageal segment opening: Complete distension and complete duration, no obstruction of flow Tongue base retraction: Narrow column of contrast or air between tongue base and PPW Pharyngeal residue: Collection of residue within or on pharyngeal structures Location of pharyngeal residue: Tongue base; Valleculae; Pharyngeal wall; Pyriform sinuses  Esophageal Impairment Domain: No data recorded Pill: No data recorded Penetration/Aspiration Scale Score: Penetration/Aspiration Scale Score 1.  Material does not enter airway: Mildly thick liquids (Level 2, nectar thick); Puree 8.  Material enters airway, passes BELOW cords without attempt by patient to eject out (silent  aspiration) : Thin liquids (Level 0) Compensatory Strategies: Compensatory Strategies Compensatory strategies: Yes Straw: Effective; Ineffective Effective Straw: Thin liquid (Level 0); Mildly thick liquid (Level 2, nectar thick) Ineffective Straw: Thin liquid (Level 0)   General Information: Caregiver present: No  Diet Prior to this Study: NPO   Temperature : Normal   Respiratory Status: WFL   Supplemental O2: None (Room air)   History of Recent Intubation: No  Behavior/Cognition: Alert; Confused; Requires cueing; Distractible Self-Feeding Abilities: Dependent for feeding Baseline vocal quality/speech: Normal Volitional Cough: Able to elicit Volitional Swallow: Unable to elicit Exam Limitations: Poor participation; Poor positioning Goal Planning: Prognosis for improved oropharyngeal function: Good Barriers to Reach Goals: Cognitive deficits; Time post onset No data recorded Patient/Family Stated Goal: none stated Consulted and agree with results and recommendations: Patient Pain: Pain Assessment Pain Assessment:  Faces Faces Pain Scale: 0 Breathing: 0 Negative Vocalization: 0 Facial Expression: 0 Body Language: 0 Consolability: 0 PAINAD Score: 0 Pain Intervention(s): Monitored during session End of Session: Start Time:SLP Start Time (ACUTE ONLY): 1354 Stop Time: SLP Stop Time (ACUTE ONLY): 1411 Time Calculation:SLP Time Calculation (min) (ACUTE ONLY): 17 min Charges: SLP Evaluations $ SLP Speech Visit: 1 Visit SLP Evaluations $BSS Swallow: 1 Procedure $MBS Swallow: 1 Procedure $Swallowing Treatment: 1 Procedure SLP visit diagnosis: SLP Visit Diagnosis: Dysphagia, oropharyngeal phase (R13.12) Past Medical History: Past Medical History: Diagnosis Date  Anxiety   Arthritis   Clotting disorder (HCC)   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   Obesity   Polycythemia vera(238.4)   History  Portal vein thrombosis   Rhinitis   Situational depression   Sleep apnea   uses CPAP every night  Tubular adenoma 12/10/2015  6 cecum polyps Past Surgical History: Past Surgical History: Procedure Laterality Date  CARDIAC CATHETERIZATION    greater 10 yrs ago, normal  CARPAL TUNNEL RELEASE    bilateral  COLONOSCOPY  06/2005  [facility]  ESOPHAGOGASTRODUODENOSCOPY (EGD) WITH PROPOFOL N/A 09/28/2016  Procedure: ESOPHAGOGASTRODUODENOSCOPY (EGD) WITH PROPOFOL;  Surgeon: Iva Boop, MD;  Location: [facility];  Service: Endoscopy;  Laterality: N/A;  IR GENERIC HISTORICAL  12/12/2016  IR US GUIDE VASC ACCESS RIGHT 12/12/2016 Jolaine Click, MD [facility]  IR GENERIC HISTORICAL  12/12/2016  IR VENOGRAM HEPATIC W HEMODYNAMIC EVALUATION 12/12/2016 Jolaine Click, MD [facility]  IR GENERIC HISTORICAL  12/12/2016  IR TRANSCATHETER BX 12/12/2016 Jolaine Click, MD [facility]  REVERSE SHOULDER ARTHROPLASTY Right 09/01/2021  Procedure: REVERSE SHOULDER ARTHROPLASTY;  Surgeon: Francena Hanly, MD;  Location: [facility];  Service: Orthopedics;  Laterality: Right;  ,  ROTATOR CUFF REPAIR    right  TENDON REPAIR    left arm  TONSILLECTOMY    TOTAL KNEE ARTHROPLASTY Right 2010  WISDOM TOOTH EXTRACTION   Gwynneth Aliment, M.A., CF-SLP Speech Language Pathology, Acute Rehabilitation Services Secure Chat preferred [phone] 03/06/2024, 2:45 PM    Time spent 55 minutes.   Barnetta Chapel M.D. [facility] 03/07/2024, 9:06 PM  Available via Epic secure chat 7am-7pm After 7 pm, please refer to night coverage provider listed on amion.

## 2024-03-08 ENCOUNTER — Inpatient Hospital Stay (HOSPITAL_COMMUNITY)

## 2024-03-08 DIAGNOSIS — Z515 Encounter for palliative care: Secondary | ICD-10-CM | POA: Diagnosis not present

## 2024-03-08 DIAGNOSIS — R269 Unspecified abnormalities of gait and mobility: Secondary | ICD-10-CM | POA: Diagnosis not present

## 2024-03-08 DIAGNOSIS — Z7189 Other specified counseling: Secondary | ICD-10-CM

## 2024-03-08 LAB — COMPREHENSIVE METABOLIC PANEL
ALT: 22 U/L (ref 0–44)
AST: 70 U/L — ABNORMAL HIGH (ref 15–41)
Albumin: 2.7 g/dL — ABNORMAL LOW (ref 3.5–5.0)
Alkaline Phosphatase: 70 U/L (ref 38–126)
Anion gap: 8 (ref 5–15)
BUN: 35 mg/dL — ABNORMAL HIGH (ref 8–23)
CO2: 19 mmol/L — ABNORMAL LOW (ref 22–32)
Calcium: 9.5 mg/dL (ref 8.9–10.3)
Chloride: 118 mmol/L — ABNORMAL HIGH (ref 98–111)
Creatinine, Ser: 1.48 mg/dL — ABNORMAL HIGH (ref 0.61–1.24)
GFR, Estimated: 48 mL/min — ABNORMAL LOW (ref >60)
Glucose, Bld: 131 mg/dL — ABNORMAL HIGH (ref 70–99)
Potassium: 3.4 mmol/L — ABNORMAL LOW (ref 3.5–5.1)
Sodium: 145 mmol/L (ref 135–145)
Total Bilirubin: 1 mg/dL (ref 0.0–1.2)
Total Protein: 5.9 g/dL — ABNORMAL LOW (ref 6.5–8.1)

## 2024-03-08 LAB — CBC WITH DIFFERENTIAL/PLATELET
Abs Immature Granulocytes: 0.06 10*3/uL (ref 0.00–0.07)
Basophils Absolute: 0 10*3/uL (ref 0.0–0.1)
Basophils Relative: 0 %
Eosinophils Absolute: 0 10*3/uL (ref 0.0–0.5)
Eosinophils Relative: 0 %
HCT: 48.3 % (ref 39.0–52.0)
Hemoglobin: 15.6 g/dL (ref 13.0–17.0)
Immature Granulocytes: 0 %
Lymphocytes Relative: 3 %
Lymphs Abs: 0.4 10*3/uL — ABNORMAL LOW (ref 0.7–4.0)
MCH: 29.8 pg (ref 26.0–34.0)
MCHC: 32.3 g/dL (ref 30.0–36.0)
MCV: 92.4 fL (ref 80.0–100.0)
Monocytes Absolute: 1.3 10*3/uL — ABNORMAL HIGH (ref 0.1–1.0)
Monocytes Relative: 10 %
Neutro Abs: 12.3 10*3/uL — ABNORMAL HIGH (ref 1.7–7.7)
Neutrophils Relative %: 87 %
Platelets: 301 10*3/uL (ref 150–400)
RBC: 5.23 MIL/uL (ref 4.22–5.81)
RDW: 15.1 % (ref 11.5–15.5)
WBC: 14.1 10*3/uL — ABNORMAL HIGH (ref 4.0–10.5)
nRBC: 0 % (ref 0.0–0.2)

## 2024-03-08 LAB — GLUCOSE, CAPILLARY
Glucose-Capillary: 112 mg/dL — ABNORMAL HIGH (ref 70–99)
Glucose-Capillary: 118 mg/dL — ABNORMAL HIGH (ref 70–99)
Glucose-Capillary: 145 mg/dL — ABNORMAL HIGH (ref 70–99)
Glucose-Capillary: 126 mg/dL — ABNORMAL HIGH (ref 70–99)
Glucose-Capillary: 95 mg/dL (ref 70–99)

## 2024-03-08 LAB — GAMMA GT: GGT: 15 U/L (ref 7–50)

## 2024-03-08 LAB — MAGNESIUM: Magnesium: 2.4 mg/dL (ref 1.7–2.4)

## 2024-03-08 MED ORDER — QUETIAPINE FUMARATE 25 MG PO TABS: 25.0000 mg | ORAL_TABLET | Freq: Two times a day (BID) | ORAL | Status: DC

## 2024-03-08 MED ORDER — SODIUM CHLORIDE 0.9 % IV SOLN: INTRAVENOUS | Status: DC

## 2024-03-08 NOTE — Consult Note (Signed)
 Palliative Care Consult Note                                  Date: 03/16/2024   Patient Name: James [name].  DOB: 09-Sep-1946  MRN: [id number]  Age / Sex: 78 y.o., male  PCP: [name], MD Referring Physician: Barnetta Chapel, MD  Reason for Consultation: Establishing goals of care  HPI/Patient Profile: 78 y.o. male  with past medical history of hypertension, hyperlipidemia, diabetes, GERD, splenic infarct, portal vein thrombosis, polycythemia vera, OSA, NASH cirrhosis, anemia, CML, and anxiety admitted on 02/29/2024 with AMS, worsening weakness, dizziness, and unsteady gait.    He was admitted with gait abnormality, generalized debility, acute metabolic encephalopathy with underlying cognitive dysfunction/vascular dementia. MRI brain negative for acute finding but did show moderate chronic small-vessel ischemic changes of the cerebral hemispheric white matter. Had an acute neurological change 3/19 with decreased command following and R facial droop. CTH negative for acute abnormality.   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)    History   Portal vein thrombosis    Rhinitis    Situational depression    Sleep apnea    uses CPAP every night   Tubular adenoma 12/10/2015   6 cecum polyps    Subjective:   I have reviewed medical records including EPIC notes, labs and imaging, received  update from Dr. Dartha Lodge, assessed the patient and then met in the family room with the patient's wife Izaah Westman and son Zimri Brennen to discuss diagnosis prognosis, GOC, EOL wishes, disposition and options.  I introduced Palliative Medicine as specialized medical care for people living with serious illness. It focuses on providing relief from symptoms and stress of a serious illness. The goal is to improve quality of life for both the patient and the family.   Today's Discussion: Patient sleeping in bed in NAD. Mitts are on. Son Morrie Sheldon at bedside. We discuss diagnosis and prognosis. We scheduled a family meeting for 1200 pm today. He will locate ACP documents prior to the meeting.  1200: Met with patient's wife and son. They have a good understanding of the patient's chronic diseases and acute hospitalization. We discussed the patient's MRI which showed moderate chronic small-vessel ischemic changes of the cerebral hemispheric white matter. We discussed that this was consistent with dementia.   We discussed the patient's gradual physical decline and increased foot shuffling over the last year and a half. The patient has always been very independent and a self-starter. The patient was living at home with his wife of almost 60 years before this admission. His family shares the patient had a more acute functional and mental status decline since his hospitalization in February 2025. 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  A discussion was had today regarding advanced directives. Concepts specific to code status, artifical feeding and hydration, continued IV antibiotics and rehospitalization was had.  The family confirms his DNR status. The difference between a aggressive medical intervention path and a palliative comfort care path for this patient at this time was had.  The patient's family shares that they want to maintain as much quality of life for the patient as they can. His wife shares that the patient enjoys simple things like watching television, seeing his family, and talking to others-- if he can do these things he will have some quality of life. They would like to continue treating the treatable but would not want him intubated-- changed to DNI. They share that when/ if his condition progresses or his quality of life worsens they will move to a comfort based path at that time. The MOST form was introduced and discussed. The patient's wife would like to review the MOST form with her family and PMT will follow up Monday for completion. The patient does have a HCPOA and Living Will/ Declaration for a natural death-- these will be scanned into [facility]. Plan is for patient to discharge to SNF.  Discussed the importance of continued conversation with family and the medical providers regarding overall plan of care and treatment options, ensuring decisions are within the context of the patient's values and GOCs.  Questions and concerns were addressed. Hard Choices booklet left for review. The family was encouraged to call with questions or concerns. PMT will continue to support holistically.  Review of Systems  Unable to perform ROS   Objective:   Primary Diagnoses: Present on Admission:  Type 2 diabetes mellitus with hyperglycemia (HCC)  Portal vein thrombosis  Polycythemia vera (HCC)  Liver cirrhosis secondary to NASH (HCC)  Iron deficiency anemia due to chronic blood loss  HTN (hypertension)  Hyperlipidemia  GERD (gastroesophageal reflux disease)  Anxiety  CML (chronic myelocytic leukemia) (HCC)   Physical Exam Vitals reviewed.  Constitutional:      General: He is sleeping. He is not in acute distress.    Comments: mitts  Cardiovascular:     Rate and Rhythm: Normal rate.  Pulmonary:     Effort: Pulmonary effort is normal.  Skin:    General:  Skin is warm and dry.  Neurological:     Mental Status: He is easily aroused. He is disoriented.     Vital Signs:  BP (!) 151/96 (BP Location: Right Arm)   Pulse 95   Temp 98.2 F (36.8 C) (Oral)   Resp 16   Ht 5\' 6"  (1.676 m)   Wt 71.7 kg   SpO2 96%   BMI 25.50 kg/m    Advanced Care Planning:   Existing Vynca/ACP Documentation: None  Primary Decision Maker: NEXT OF KIN. Patient's spouse Eldor Conaway  Code Status/Advance Care Planning: DNR   Assessment & Plan:   SUMMARY OF RECOMMENDATIONS   DNR Changed to DNI Continue to treat the treatable Discharge to SNF Will have HCPOA and Living Will scanned into [facility] Continued PMT support: Follow up Monday to complete MOST form    Discussed with: Dr. Dartha Lodge  Time Total: 105 minutes    Thank you for allowing [place] to participate in the care of [employer]. PMT will continue to support holistically.   Signed  by: Sarina Ser, NP Palliative Medicine Team  Team Phone # [phone] (Nights/Weekends)  03/16/2024, 8:58 AM

## 2024-03-08 NOTE — Code Documentation (Signed)
  Patient Name: James [name].   MRN: [id number]   Date of Birth/ Sex: May 16, 1946 , male      Admission Date: 02/29/2024  Attending Provider: Barnetta Chapel, MD  Primary Diagnosis: Gait abnormality [R26.9]   Indication: Pt was in his usual state of health until this PM, when he was noted to be pulseless. Code blue was subsequently called. At the time of arrival on scene, ACLS protocol was underway.   Technical Description:  - CPR performance duration:  7 minutes  - Was defibrillation or cardioversion used? No   - Was external pacer placed? Yes  - Was patient intubated pre/post CPR? No   Medications Administered: Y = Yes; Blank = No Amiodarone    Atropine    Calcium    Epinephrine    Lidocaine    Magnesium    Norepinephrine    Phenylephrine    Sodium bicarbonate    Vasopressin    Other    Post CPR evaluation:  - Final Status - Was patient successfully resuscitated ? No   Miscellaneous Information:  - Time of death:  8:45 PM  - Primary team notified?  Yes  - Family Notified? Yes     Rocky Morel, DO   03/06/2024, 8:55 PM

## 2024-03-08 NOTE — Plan of Care (Signed)
  Problem: Clinical Measurements: Goal: Ability to maintain clinical measurements within normal limits will improve Outcome: Progressing Goal: Will remain free from infection Outcome: Progressing Goal: Cardiovascular complication will be avoided Outcome: Progressing   

## 2024-03-08 NOTE — Progress Notes (Signed)
 [facility] night cross cover note:   I was notified by RN that this patient had a witnessed seizure immediately prior to going into agonal respirations at which time he was found to be pulseless.  Initially a CODE BLUE was called, and CODE team responded, at which time it was determined that the patient's CODE STATUS was DNR/DNI. Code was stopped at time, and MD on code team pronounced the patient. Time of death reported to be 2044-04-05 on 28-Mar-2024. Per my discussions with the patient's RN, she will notify the patient's family at this time.    Newton Pigg, DO Hospitalist

## 2024-03-08 NOTE — Progress Notes (Signed)
 [facility]                                                                              Jehiel Koepp, is a 78 y.o. male, DOB - May 16, 1946, QIO:[id number] Admit date - 02/29/2024    Outpatient Primary MD for the patient is [name], MD  LOS - 8  days  Chief Complaint  Patient presents with   Altered Mental Status   Weakness       Brief summary   Patient is a 77 year old male with HTN, hyperlipidemia, diabetes, GERD, history of splenic infarct, portal vein thrombosis, polycythemia vera, OSA, NASH cirrhosis, anemia, CML, anxiety presented with worsening weakness.  He was admitted 2/8-2/10 with altered mental status, dizziness, unsteady gait and had full neurology workup.  It was felt that his CML treatment Dasatinib was contributing to this and was discontinued.  Confusion has improved however he has had worsening generalized weakness, unsteady gait, needing help with transfers in the last 2 to 3 days.  Wife can no longer help care for him and patient is needing placement.  He was seen in the med [facility] ED on 3/13 and again presented to the hospital on 3/14. EDP also requested neurology to see the patient  03/07/2024: Patient seen alongside patient's wife, brother-in-law and patient's son's girlfriend, in the presence of patient's nurse.  Patient's family confirmed that patient forgot to pay his bills for about 2 months preceding admission.  Family is also concerned about possible parkinsonian features, with associated visual hallucination (according to patient's family) .  No prior diagnosis of Lewy body dementia.  CO2 of 16 is noted.  Abdominal ultrasound done about 3 years ago revealed infrarenal abdominal aortic aneurysm, enlarged spleen, liver and renal cysts.  Will repeat abdominal x-ray.  AST/ALT ratio is greater than 2.  Will check gamma GT.  Neurology input is appreciated.  03/14/2024: Patient seen alongside patient's wife, granddaughter and patient's  son's girlfriend.  Confusion persist.  Etiology uncertain, but suspect Lewy body dementia and some form of dementing process.  Patient has been on DDAVP for years, reason unclear.  Will find out more.  Continue IV fluids.  Neurology input is highly appreciated.   Assessment & Plan    Principal Problem:   Gait abnormality, Generalized debility --CT head on 3/13: negative for acute intracranial abnormality -MRI brain 3/14 showed no acute findings, age-related volume loss, chronic small vessel ischemic changes of the cerebral hemispheric white matter, old small vessel cerebellar infarctions.  Bilateral mastoid effusions. -CK normal. Vitamin D low, placed on replacement.   - B12 normal, folate normal, has iron deficiency -TSH normal 1.7, CRP 0.9, ESR 1. -On 3/19, during PT, patient was noted to be leaning towards the right with facial drooping, code stroke called.  Seen by neurology -MRI brain with no acute intracranial abnormality.  Age-related cerebral atrophy with moderate chronic microvascular ischemic disease.  - MRA negative for large vessel occlusion, no visible hemodynamically significant stenosis -N.p.o. for SLP eval.  PT OT eval.  -Discussed with Dr. Roda Shutters, continue Seroquel but at earlier time, changed timings to 8 PM. -Continue Xarelto  Active problems Acute metabolic encephalopathy, with underlying cognitive dysfunction/vascular dementia -Ammonia level normal, no acute infectious process, likely has underlying dementia -Creatinine 1.31 with hemoglobin 17.0, placed on IV fluids -EKG repeated, QTc 446 -EEG showed mild diffuse encephalopathy, no seizures or epileptiform discharges -See #1 -Will consult palliative medicine 03/07/2024: Concerning for possible Lewy body dementia.  See above documentation.  Stable CKD 3a: -Creatinine 1.6 on admission, was 1.5 on 3/14, 1.4 on 3/13 and 1.3 on 2/10 -Creatinine 1.5, on gentle hydration 02/23/2024: Abdominal ultrasound.  Hypercalcemia,  chronic -Calcium 11.5 on 01/28/2024 - Vitamin D 1.25OH low, PTH level, PTH rP pending, received Zometa IV x 1 on 3/19 -Corrected calcium 11.7   Hypertension -BP elevated, continue propranolol -Continue hydralazine IV as needed with parameters  Hyperlipidemia -CK normal, resumed statin   Diabetes -Continue sliding scale insulin while inpatient CBG (last 3)  Recent Labs    03/02/2024 0624 03/17/2024 0727 02/16/2024 1242  GLUCAP 112* 118* 145*  -Continue moderate SSI  Chronic nocturia -Continue DDAVP at night, follow sodium level   History of splenic infarct - Continue home Xarelto   Polycythemia vera, CML  Followed by oncology. Dr Myna Hidalgo outpatient.  Was on Sprycel with good response and at last eval in February was considered to be in remission.  Sprycel was discontinued.    NASH cirrhosis LFTs stable   OSA - Continue home CPAP   Estimated body mass index is 25.5 kg/m as calculated from the following:   Height as of this encounter: 5\' 6"  (1.676 m).   Weight as of this encounter: 71.7 kg.  Code Status: DNR DVT Prophylaxis:  rivaroxaban (XARELTO) tablet 10 mg Start: 03/01/24 1000 rivaroxaban (XARELTO) tablet 10 mg   Level of Care: Level of care: Telemetry Medical Family Communication: Updated patient's wife and son at the bedside at the bedside on 3/19 Disposition Plan:      Remains inpatient appropriate: PT OT evaluation recommended SNF   Procedures:  None  Consultants:   Neurology   Antimicrobials:   Anti-infectives (From admission, onward)    None          Medications  buPROPion  300 mg Oral Daily   desmopressin  400 mcg Oral QHS   insulin aspart  0-15 Units Subcutaneous TID WC   insulin aspart  0-5 Units Subcutaneous QHS   propranolol  10 mg Oral Daily   QUEtiapine  25 mg Oral QHS   rivaroxaban  10 mg Oral Daily   rosuvastatin  20 mg Oral QHS   sodium bicarbonate  650 mg Oral BID   sodium chloride flush  3 mL Intravenous Q12H       Subjective:  Patient remains confused.  Objective:   Vitals:   02/19/2024 0430 03/13/2024 0728 02/18/2024 1240 03/04/2024 1242  BP: (!) 157/89 (!) 151/96 (!) 170/95   Pulse: 89 95 83 85  Resp: 18 16 18    Temp: 100.1 F (37.8 C) 98.2 F (36.8 C) 98.8 F (37.1 C)   TempSrc: Oral Oral Oral   SpO2: 96% 96%  98%  Weight:      Height:        Intake/Output Summary (Last 24 hours) at 02/18/2024 1455 Last data filed at 02/20/2024 0732 Gross per 24 hour  Intake 1517.34 ml  Output 1151 ml  Net 366.34 ml     Wt Readings from Last 3 Encounters:  02/29/24 71.7 kg  02/28/24 71.7 kg  01/28/24 77.2 kg   Physical Exam General: Not in any distress.  Awake. Cardiovascular: S1 S2 clear.  Respiratory: Clear to auscultation. Gastrointestinal: Obese, soft and nontender.   Ext: No leg edema. Neuro: Awake and alert.  Remains confused.   Data Reviewed:  I have personally reviewed following labs    CBC Lab Results  Component Value Date   WBC 14.1 (H) 03/16/2024   RBC 5.23 03/15/2024   HGB 15.6 02/28/2024   HCT 48.3 02/16/2024   MCV 92.4 03/17/2024   MCH 29.8 03/04/2024   PLT 301 03/16/2024   MCHC 32.3 03/16/2024   RDW 15.1 03/03/2024   LYMPHSABS 0.4 (L) 03/13/2024   MONOABS 1.3 (H) 03/17/2024   EOSABS 0.0 03/02/2024   BASOSABS 0.0 02/27/2024     Last metabolic panel Lab Results  Component Value Date   NA 145 02/16/2024   K 3.4 (L) 03/17/2024   CL 118 (H) 03/11/2024   CO2 19 (L) 02/17/2024   BUN 35 (H) 02/18/2024   CREATININE 1.48 (H) 03/05/2024   GLUCOSE 131 (H) 03/04/2024   GFRNONAA 48 (L) 02/17/2024   GFRAA 53 (L) 09/02/2020   CALCIUM 9.5 03/03/2024   PHOS 2.6 03/04/2024   PROT 5.9 (L) 03/11/2024   ALBUMIN 2.7 (L) 02/27/2024   LABGLOB 2.4 10/25/2016   AGRATIO 1.7 10/25/2016   BILITOT 1.0 02/20/2024   ALKPHOS 70 03/12/2024   AST 70 (H) 02/28/2024   ALT 22 03/04/2024   ANIONGAP 8 02/24/2024    CBG (last 3)  Recent Labs    03/14/2024 0624 02/27/2024 0727  02/25/2024 1242  GLUCAP 112* 118* 145*      Coagulation Profile: No results for input(s): "INR", "PROTIME" in the last 168 hours.   Radiology Studies: I have personally reviewed the imaging studies  No results found.    Time spent 35 minutes.   Barnetta Chapel M.D. [facility] 03/13/2024, 2:55 PM  Available via Epic secure chat 7am-7pm After 7 pm, please refer to night coverage provider listed on amion.

## 2024-03-17 LAB — PTH-RELATED PEPTIDE: PTH-related peptide: <2 pmol/L

## 2024-03-18 NOTE — Progress Notes (Signed)
 Upon going in room to round on patient after he reportedly attempted to get out of bed and bed alarm going off per charge nurse the patient was staring into space not responding to verbal stimuli and began to have a seizure.  When seizure activity stopped his respirations were agonal and he did not have a pulse.  CPR initiated due to patient having respirations but no pulse and code blue called.  At rhythm check patient determined to not have pulse or respirations and resuscitation efforts were stopped due to code status Do not attempt resuscitation (DNR) PRE-ARREST INTERVENTIONS DESIRED  See code documentation by Dr. Geraldo Pitter for more details.  Patient pronounced dead at 04/12/44 by Dr. Rocky Morel.  Dr. Arlean Hopping notified via secure chat.  Patient's wife Willian Donson notified via phone of the patient's death at which time she asked if she could come to the hospital to see him and informed her she could.  [facility] notified of death via phone at 04/12/14 confirmation number [id number]/[phone] given and Celina Bourdon notified me patient would be a tissue hold. Patients family arrived and spent time at the bedside.  Upon leaving when asked about a funeral home the wife was unsure.  Patient belongings were taken home by family.  Patient placed in body bag with tag on left great toe and outside zipper. Patient taken to the morgue at 0000

## 2024-03-18 DEATH — deceased

## 2024-04-17 NOTE — Discharge Summary (Signed)
 Physician Discharge Summary   Death discharge summary:  Patient ID: James [name]. MRN: [id number] DOB/AGE: November 20, 1946 78 y.o.  Admit date: 03-06-24 Patient died on: 03/14/2024 and 8:45 PM.    Patient was pronounced by: Pearlean Brownie, RN  Admission Diagnoses:  Discharge Diagnoses:  Acute metabolic encephalopathy. Seizure. Polycythemia vera (HCC) Chronic kidney disease stage IIIa. Chronic hypercalcemia. Hypertension. Splenic infarct.  Hyperlipidemia  Portal vein thrombosis   DM (diabetes mellitus) (HCC)  Liver cirrhosis secondary to NASH (HCC)  Iron deficiency anemia due to chronic blood loss  OSA on CPAP  CML (chronic myelocytic leukemia) (HCC)  Type 2 diabetes mellitus with hyperglycemia (HCC)  Anxiety  GERD (gastroesophageal reflux disease)   Summary:  Patient was a 78 year old male with history of hypertension, hyperlipidemia, diabetes, GERD, history of splenic infarct, portal vein thrombosis, polycythemia vera, OSA, NASH cirrhosis, anemia, CML, anxiety presented with worsening weakness and confusion.  Patient was admitted for further workup and management.  Neurology team was consulted to assist in directing patient's care.  CT head was negative for acute intracranial abnormality.  MRI brain did not show any acute findings.  B12 and folate were normal.  CRP was 0.9 with ESR of 1.  TSH was normal.  Despite extensive workup, intermittent confusion persisted.  Patient family provided history the patient may have been forgetting to pay his bills 2 months prior to presentation.  Patient was noted to have developed seizure and cardiopulmonary arrest shortly afterwards.  Patient died on March 14, 2024 at 8:45 PM    Signed: Barnetta Chapel 03/19/2024, 6:36 AM

## 2024-04-24 ENCOUNTER — Ambulatory Visit: Payer: Medicare Other | Admitting: Hematology & Oncology

## 2024-04-24 ENCOUNTER — Inpatient Hospital Stay: Payer: Medicare Other

## 2024-04-24 ENCOUNTER — Other Ambulatory Visit: Payer: Medicare Other

## 2024-08-27 ENCOUNTER — Ambulatory Visit: Payer: Medicare Other | Admitting: Adult Health

## 2024-11-17 ENCOUNTER — Ambulatory Visit: Payer: Medicare Other | Admitting: Adult Health
